# Patient Record
Sex: Male | Born: 1950
Health system: Southern US, Community
[De-identification: ages and names within clinical notes are randomized; demographics above are authoritative.]

## PROBLEM LIST (undated history)

## (undated) DIAGNOSIS — M6282 Rhabdomyolysis: Secondary | ICD-10-CM

## (undated) DIAGNOSIS — I10 Essential (primary) hypertension: Secondary | ICD-10-CM

## (undated) DIAGNOSIS — R600 Localized edema: Secondary | ICD-10-CM

## (undated) DIAGNOSIS — K224 Dyskinesia of esophagus: Secondary | ICD-10-CM

## (undated) DIAGNOSIS — K7689 Other specified diseases of liver: Secondary | ICD-10-CM

## (undated) DIAGNOSIS — I509 Heart failure, unspecified: Secondary | ICD-10-CM

## (undated) DIAGNOSIS — N183 Chronic kidney disease, stage 3 unspecified: Secondary | ICD-10-CM

## (undated) DIAGNOSIS — K649 Unspecified hemorrhoids: Secondary | ICD-10-CM

## (undated) DIAGNOSIS — R413 Other amnesia: Secondary | ICD-10-CM

## (undated) DIAGNOSIS — G9341 Metabolic encephalopathy: Secondary | ICD-10-CM

## (undated) DIAGNOSIS — N281 Cyst of kidney, acquired: Secondary | ICD-10-CM

## (undated) DIAGNOSIS — R7881 Bacteremia: Secondary | ICD-10-CM

## (undated) DIAGNOSIS — R066 Hiccough: Secondary | ICD-10-CM

## (undated) DIAGNOSIS — K219 Gastro-esophageal reflux disease without esophagitis: Secondary | ICD-10-CM

## (undated) DIAGNOSIS — F1011 Alcohol abuse, in remission: Secondary | ICD-10-CM

## (undated) DIAGNOSIS — N200 Calculus of kidney: Secondary | ICD-10-CM

## (undated) DIAGNOSIS — J9859 Other diseases of mediastinum, not elsewhere classified: Secondary | ICD-10-CM

## (undated) DIAGNOSIS — I5030 Unspecified diastolic (congestive) heart failure: Secondary | ICD-10-CM

## (undated) DIAGNOSIS — Z8719 Personal history of other diseases of the digestive system: Secondary | ICD-10-CM

## (undated) DIAGNOSIS — N400 Enlarged prostate without lower urinary tract symptoms: Secondary | ICD-10-CM

## (undated) DIAGNOSIS — K8020 Calculus of gallbladder without cholecystitis without obstruction: Secondary | ICD-10-CM

## (undated) DIAGNOSIS — K5730 Diverticulosis of large intestine without perforation or abscess without bleeding: Secondary | ICD-10-CM

## (undated) DIAGNOSIS — R0609 Other forms of dyspnea: Secondary | ICD-10-CM

## (undated) DIAGNOSIS — G629 Polyneuropathy, unspecified: Secondary | ICD-10-CM

## (undated) DIAGNOSIS — R222 Localized swelling, mass and lump, trunk: Secondary | ICD-10-CM

## (undated) DIAGNOSIS — R0600 Dyspnea, unspecified: Secondary | ICD-10-CM

## (undated) DIAGNOSIS — I700 Atherosclerosis of aorta: Secondary | ICD-10-CM

## (undated) HISTORY — DX: Essential (primary) hypertension: I10

---

## 1987-03-11 HISTORY — PX: MINOR HEMORRHOIDECTOMY: SHX6238

## 2004-02-08 ENCOUNTER — Ambulatory Visit: Payer: Self-pay | Admitting: Unknown Physician Specialty

## 2004-02-21 ENCOUNTER — Other Ambulatory Visit: Payer: Self-pay

## 2004-02-26 ENCOUNTER — Ambulatory Visit: Payer: Self-pay | Admitting: Unknown Physician Specialty

## 2009-07-09 ENCOUNTER — Emergency Department: Payer: Self-pay | Admitting: Emergency Medicine

## 2012-04-15 ENCOUNTER — Emergency Department: Payer: Self-pay | Admitting: Emergency Medicine

## 2012-04-15 ENCOUNTER — Ambulatory Visit: Payer: Self-pay | Admitting: Family Medicine

## 2012-04-15 LAB — COMPREHENSIVE METABOLIC PANEL
Albumin: 3.4 g/dL (ref 3.4–5.0)
Anion Gap: 7 (ref 7–16)
BUN: 17 mg/dL (ref 7–18)
Bilirubin,Total: 0.8 mg/dL (ref 0.2–1.0)
Calcium, Total: 8.5 mg/dL (ref 8.5–10.1)
Co2: 30 mmol/L (ref 21–32)
EGFR (African American): >60
EGFR (Non-African Amer.): >60
Glucose: 107 mg/dL — ABNORMAL HIGH (ref 65–99)
Potassium: 3.5 mmol/L (ref 3.5–5.1)
SGPT (ALT): 63 U/L (ref 12–78)
Sodium: 135 mmol/L — ABNORMAL LOW (ref 136–145)
Total Protein: 7.3 g/dL (ref 6.4–8.2)

## 2012-04-15 LAB — CBC
HCT: 47.7 % (ref 40.0–52.0)
HGB: 15.7 g/dL (ref 13.0–18.0)
MCH: 30.1 pg (ref 26.0–34.0)
MCHC: 32.8 g/dL (ref 32.0–36.0)
MCV: 92 fL (ref 80–100)
Platelet: 175 10*3/uL (ref 150–440)
RDW: 13.9 % (ref 11.5–14.5)
WBC: 3.7 10*3/uL — ABNORMAL LOW (ref 3.8–10.6)

## 2012-04-28 ENCOUNTER — Ambulatory Visit: Payer: Self-pay | Admitting: Family Medicine

## 2012-05-26 ENCOUNTER — Ambulatory Visit: Payer: Self-pay | Admitting: Family Medicine

## 2015-11-15 ENCOUNTER — Encounter: Payer: Self-pay | Admitting: Family Medicine

## 2015-11-15 ENCOUNTER — Emergency Department
Admission: EM | Admit: 2015-11-15 | Discharge: 2015-11-15 | Disposition: A | Discharge status: Home / Self Care | Payer: Medicare Other | Attending: Emergency Medicine | Admitting: Emergency Medicine

## 2015-11-15 ENCOUNTER — Ambulatory Visit (INDEPENDENT_AMBULATORY_CARE_PROVIDER_SITE_OTHER): Payer: Medicare Other | Admitting: Family Medicine

## 2015-11-15 ENCOUNTER — Emergency Department: Payer: Medicare Other

## 2015-11-15 ENCOUNTER — Encounter: Payer: Self-pay | Admitting: Emergency Medicine

## 2015-11-15 DIAGNOSIS — L02215 Cutaneous abscess of perineum: Secondary | ICD-10-CM | POA: Diagnosis not present

## 2015-11-15 DIAGNOSIS — Z87891 Personal history of nicotine dependence: Secondary | ICD-10-CM | POA: Insufficient documentation

## 2015-11-15 DIAGNOSIS — I10 Essential (primary) hypertension: Secondary | ICD-10-CM | POA: Insufficient documentation

## 2015-11-15 DIAGNOSIS — N433 Hydrocele, unspecified: Secondary | ICD-10-CM | POA: Diagnosis not present

## 2015-11-15 DIAGNOSIS — L03315 Cellulitis of perineum: Secondary | ICD-10-CM | POA: Insufficient documentation

## 2015-11-15 DIAGNOSIS — N50819 Testicular pain, unspecified: Secondary | ICD-10-CM

## 2015-11-15 DIAGNOSIS — E559 Vitamin D deficiency, unspecified: Secondary | ICD-10-CM | POA: Insufficient documentation

## 2015-11-15 LAB — URINALYSIS COMPLETE WITH MICROSCOPIC (ARMC ONLY)
BACTERIA UA: NONE SEEN
Bilirubin Urine: NEGATIVE
GLUCOSE, UA: 50 mg/dL — AB
LEUKOCYTES UA: NEGATIVE
Nitrite: NEGATIVE
PH: 6 (ref 5.0–8.0)
Protein, ur: 100 mg/dL — AB
SPECIFIC GRAVITY, URINE: 1.021 (ref 1.005–1.030)

## 2015-11-15 LAB — CBC WITH DIFFERENTIAL/PLATELET
BASOS PCT: 0 %
Basophils Absolute: 0.1 10*3/uL (ref 0–0.1)
EOS ABS: 0 10*3/uL (ref 0–0.7)
EOS PCT: 0 %
HCT: 46.9 % (ref 40.0–52.0)
Hemoglobin: 16 g/dL (ref 13.0–18.0)
LYMPHS ABS: 0.3 10*3/uL — AB (ref 1.0–3.6)
Lymphocytes Relative: 3 %
MCH: 33.8 pg (ref 26.0–34.0)
MCHC: 34.2 g/dL (ref 32.0–36.0)
MCV: 98.9 fL (ref 80.0–100.0)
Monocytes Absolute: 0.8 10*3/uL (ref 0.2–1.0)
Monocytes Relative: 7 %
Neutro Abs: 11 10*3/uL — ABNORMAL HIGH (ref 1.4–6.5)
Neutrophils Relative %: 90 %
PLATELETS: 214 10*3/uL (ref 150–440)
RBC: 4.74 MIL/uL (ref 4.40–5.90)
RDW: 13.9 % (ref 11.5–14.5)
WBC: 12.2 10*3/uL — AB (ref 3.8–10.6)

## 2015-11-15 LAB — COMPREHENSIVE METABOLIC PANEL
ALBUMIN: 3.3 g/dL — AB (ref 3.5–5.0)
ALT: 41 U/L (ref 17–63)
ANION GAP: 14 (ref 5–15)
AST: 46 U/L — ABNORMAL HIGH (ref 15–41)
Alkaline Phosphatase: 91 U/L (ref 38–126)
BUN: 20 mg/dL (ref 6–20)
CHLORIDE: 92 mmol/L — AB (ref 101–111)
CO2: 23 mmol/L (ref 22–32)
Calcium: 8.6 mg/dL — ABNORMAL LOW (ref 8.9–10.3)
Creatinine, Ser: 1.12 mg/dL (ref 0.61–1.24)
GFR calc non Af Amer: >60 mL/min (ref >60)
Glucose, Bld: 235 mg/dL — ABNORMAL HIGH (ref 65–99)
Potassium: 3.2 mmol/L — ABNORMAL LOW (ref 3.5–5.1)
SODIUM: 129 mmol/L — AB (ref 135–145)
Total Bilirubin: 1.5 mg/dL — ABNORMAL HIGH (ref 0.3–1.2)
Total Protein: 6.7 g/dL (ref 6.5–8.1)

## 2015-11-15 MED ORDER — HYDROMORPHONE HCL 1 MG/ML IJ SOLN: 0.5000 mg | Freq: Once | INTRAMUSCULAR | Status: DC

## 2015-11-15 MED ORDER — CLINDAMYCIN PHOSPHATE 600 MG/50ML IV SOLN
600.0000 mg | Freq: Once | INTRAVENOUS | Status: AC
  Administered 2015-11-15: 600 mg via INTRAVENOUS
  Filled 2015-11-15: qty 50

## 2015-11-15 MED ORDER — CLINDAMYCIN HCL 150 MG PO CAPS: 300.0000 mg | ORAL_CAPSULE | Freq: Four times a day (QID) | ORAL | 0 refills | Status: DC

## 2015-11-15 MED ORDER — HYDROMORPHONE HCL 1 MG/ML IJ SOLN
1.0000 mg | Freq: Once | INTRAMUSCULAR | Status: AC
  Administered 2015-11-15: 1 mg via INTRAVENOUS
  Filled 2015-11-15: qty 1

## 2015-11-15 MED ORDER — HYDROMORPHONE HCL 1 MG/ML IJ SOLN
0.5000 mg | Freq: Once | INTRAMUSCULAR | Status: AC
  Administered 2015-11-15: 0.5 mg via INTRAVENOUS

## 2015-11-15 MED ORDER — OXYCODONE-ACETAMINOPHEN 5-325 MG PO TABS: 1.0000 | ORAL_TABLET | ORAL | 0 refills | Status: DC | PRN

## 2015-11-15 MED ORDER — ONDANSETRON HCL 4 MG/2ML IJ SOLN
4.0000 mg | Freq: Once | INTRAMUSCULAR | Status: AC
  Administered 2015-11-15: 4 mg via INTRAVENOUS
  Filled 2015-11-15: qty 2

## 2015-11-15 MED ORDER — SODIUM CHLORIDE 0.9 % IV SOLN
Freq: Once | INTRAVENOUS | Status: AC
  Administered 2015-11-15: 14:00:00 via INTRAVENOUS

## 2015-11-15 MED ORDER — HYDROCODONE-ACETAMINOPHEN 5-325 MG PO TABS: 1.0000 | ORAL_TABLET | ORAL | 0 refills | Status: DC | PRN

## 2015-11-15 MED ORDER — HYDROMORPHONE HCL 1 MG/ML IJ SOLN
INTRAMUSCULAR | Status: AC
  Administered 2015-11-15: 0.5 mg via INTRAVENOUS
  Filled 2015-11-15: qty 1

## 2015-11-15 NOTE — ED Triage Notes (Signed)
Pt arrives ambulatory to triage with reports of a perineal abscess that has been bothering him since Saturday  Pt reports left sided groin / inner thigh pain and edema

## 2015-11-15 NOTE — Assessment & Plan Note (Signed)
Elevated today with acute pain Not on anti-HTN treatment currently Follow-up once acute painful infection resolved, re-address HTN

## 2015-11-15 NOTE — Assessment & Plan Note (Signed)
Worsening L>R perineal infection with cellulitis and suspected abscess extending from beyond rectum across perineum into L>R scrotum. Significant erythema, induration, tenderness, no evidence of active draining. Clinically still well appearing except for pain, but some hemodynamic instability with elevated BP and tachycardic, low grade temp 99.53F. Concern for progression of infection.  Plan: 1. Discussed case with college Dr Luan Pulling, contacted [facility]rology and spoke with Dr Erlene Quan to review case briefly as considered possible outpatient Urology eval, mutual decision to send patient directly to [facility] ED for further expedited work-up and management, as he may require evaluation by General Surgery. Suspect will need labs, CT imaging, possible I&D and IV antibiotics 2. Follow-up to re-establish care once this acute problem is treated

## 2015-11-15 NOTE — ED Notes (Signed)
Pt transported to US at this time. 

## 2015-11-15 NOTE — Progress Notes (Signed)
Subjective:    Patient ID: John Booker, male    DOB: Mar 30, 1950, 65 y.o.   MRN: [id number]  John Booker is a 65 y.o. male presenting on 11/15/2015 for Cellulitis (Perineal)   HPI   PERINEAL ABSCESS / CELLULITIS: 1 week ago started rectal pain, constant, gradually worsening, until 2 days ago severe pain, initially 1/10, now up to 12/10, improved with relieving pressure, standing or laying on sides but significant worsening with sitting. Has tried applying some ice without improvement. Not tried any medications. Never had similar episode before. Thought to have "slightly enlarged prostate" in the past, but no significant symptoms. Denies any groin/pelvic trauma, riding bike / motorcycle - Admits occasional temperature instability but no recorded fever, severe diarrhea x 2 days (>7x daily) - Denies any sweats, dysuria, hematuria, decreased urine output, abdominal pain, nausea, vomiting, back or flank pain  Additoinal soc history: Currently works part-time as maintenance, not able to work at this time due to pain  Family History  Problem Relation Age of Onset  . Hypertension Father     Past Surgical History:  Procedure Laterality Date  . MINOR HEMORRHOIDECTOMY  1989    Social History   Social History  . Marital status: Legally Separated    Spouse name: N/A  . Number of children: N/A  . Years of education: N/A   Social History Main Topics  . Smoking status: Former Smoker    Packs/day: 0.50    Years: 38.00    Types: Cigarettes  . Smokeless tobacco: Never Used  . Alcohol use No  . Drug use: No  . Sexual activity: Not Asked   Other Topics Concern  . None   Social History Narrative  . None    Review of Systems  Constitutional: Positive for activity change (limited sitting and ambulation due to perineal pain) and chills. Negative for appetite change, diaphoresis, fatigue, fever and unexpected weight change.  HENT: Negative for hearing loss, rhinorrhea and sinus  pressure.   Eyes: Negative for visual disturbance.  Respiratory: Negative for cough, chest tightness and shortness of breath.   Cardiovascular: Negative for chest pain, palpitations and leg swelling.  Gastrointestinal: Positive for diarrhea (improved) and rectal pain. Negative for abdominal distention, abdominal pain, anal bleeding, blood in stool, constipation, nausea and vomiting.  Endocrine: Negative for cold intolerance and heat intolerance.  Genitourinary: Positive for scrotal swelling and testicular pain. Negative for decreased urine volume, difficulty urinating, discharge, dysuria, flank pain, frequency, hematuria, penile pain, penile swelling and urgency.  Musculoskeletal: Negative for arthralgias.  Skin: Positive for rash (erythema groin). Negative for wound.  Neurological: Negative for dizziness, weakness, light-headedness, numbness and headaches.  Hematological: Positive for adenopathy (inguinal).  Psychiatric/Behavioral: Negative for confusion and dysphoric mood. The patient is not nervous/anxious.    Per HPI unless specifically indicated above     Objective:    BP (!) 180/112 (BP Location: Left Arm, Patient Position: Sitting, Cuff Size: Large)   Pulse (!) 109   Temp 99.7 F (37.6 C) (Oral)   Resp 16   Ht 6' (1.829 m)   Wt 215 lb (97.5 kg)   BMI 29.16 kg/m   Wt Readings from Last 3 Encounters:  11/15/15 215 lb (97.5 kg)    Physical Exam  Constitutional: He is oriented to person, place, and time. He appears well-developed and well-nourished. No distress.  Currently well appearing but significant discomfort due to groin pain, unable to sit, prefer to stand and able to lay on exam  table  HENT:  Head: Normocephalic and atraumatic.  Eyes: Conjunctivae and EOM are normal. Pupils are equal, round, and reactive to light. Right eye exhibits no discharge. Left eye exhibits no discharge.  Neck: Neck supple.  Cardiovascular: Regular rhythm and normal heart sounds.   No murmur  heard. tachycardic  Pulmonary/Chest: Effort normal and breath sounds normal. No respiratory distress.  Abdominal: Soft. He exhibits no distension and no mass. There is no tenderness.  Genitourinary:  Genitourinary Comments: Significant perineal erythema, warmth, induration with edema, primarily left sided, extending from rectum into scrotum, L>R. Erythema is bilateral perineal area extending to and beyond rectum. No evidence of ulceration or wound opening, no drainage of pus or bleeding. No obvious external hemorrhoid  Musculoskeletal: He exhibits no edema.  Lymphadenopathy:       Right: Inguinal adenopathy present.       Left: Inguinal adenopathy present.  Neurological: He is alert and oriented to person, place, and time.  Skin: Skin is warm and dry. He is not diaphoretic. There is erythema (perineal, see above).  Nursing note and vitals reviewed.      Assessment & Plan:   Problem List Items Addressed This Visit    Perineal abscess    Worsening L>R perineal infection with cellulitis and suspected abscess extending from beyond rectum across perineum into L>R scrotum. Significant erythema, induration, tenderness, no evidence of active draining. Clinically still well appearing except for pain, but some hemodynamic instability with elevated BP and tachycardic, low grade temp 99.83F. Concern for progression of infection.  Plan: 1. Discussed case with college Dr Luan Pulling, contacted [facility] Urology and spoke with Dr Erlene Quan to review case briefly as considered possible outpatient Urology eval, mutual decision to send patient directly to [facility] ED for further expedited work-up and management, as he may require evaluation by General Surgery. Suspect will need labs, CT imaging, possible I&D and IV antibiotics 2. Follow-up to re-establish care once this acute problem is treated      HTN (hypertension)    Elevated today with acute pain Not on anti-HTN treatment currently Follow-up once acute  painful infection resolved, re-address HTN       Other Visit Diagnoses   None.     No orders of the defined types were placed in this encounter.     Follow up plan: Return in about 3 weeks (around 12/06/2015) for re-establish care, after resolved perineal abscess.   A total of 45 minutes was spent face-to-face with this patient. Greater than 50% of this time was spent in counseling and coordination of care with the patient, contacting Urology specialist and ED to discuss case with worsening perineal abscess, patient was ultimately urgently sent directly to ED for further work-up.   [name], [facility] 11/15/2015, 9:57 AM

## 2015-11-15 NOTE — Patient Instructions (Signed)
Thank you for coming in to clinic today.  1. Please go directly to the [facility] ED - for Perineal Abscess infection - May need bloodwork, imaging, possibly incision and drainage, likely IV antibiotics - I spoke with Dr Erlene Quan [facility] urology) they said maybe General Surgery might need to be involved as well, this will be determined in the ED  Once you are discharged from ED or hospital and feeling better, please re-schedule a new patient visit with [place] to re-establish care, and bring the new patient paperwork.  If you have any other questions or concerns, please feel free to call the clinic or send a message through [facility]. You may also schedule an earlier appointment if necessary.  [name], DO [place]

## 2015-11-15 NOTE — ED Provider Notes (Signed)
[facility] Emergency Department Provider Note   ____________________________________________   None    (approximate)  I have reviewed the triage vital signs and the nursing notes.   HISTORY  Chief Complaint Abscess   HPI John Booker is a 65 y.o. male who presents with severe 10/10 pain of the perineum.  Pain began 4 days ago and has intensified.  Reports that lying down reduces the pain, but sitting, standing, and walking aggravate the pain. Reports anorexia, nausea, fatigue, "hot flashes" and chills.  States he has tried to maintain fluid intake with water, Gatorade, and Sprite.  Reports diarrhea for past 2 days without pain on defecation.  Denies vomiting, or bleeding from perineum or rectum.  States he has not tried any medications to control symptoms.  Pain has prevented patient from working. He went to an urgent care this morning, who referred him to the ED.  The provider at the urgent care diagnosed him with a perineal abscess and told the patient he would need to see either a urologist or general surgeon.     History reviewed. No pertinent past medical history.  Patient Active Problem List   Diagnosis Date Noted  . Perineal abscess 11/15/2015  . HTN (hypertension) 11/15/2015  . Vitamin D deficiency 11/15/2015    Past Surgical History:  Procedure Laterality Date  . [place]    Prior to Admission medications   Medication Sig Start Date End Date Taking? Authorizing Provider  clindamycin (CLEOCIN) 150 MG capsule Take 2 capsules (300 mg total) by mouth 4 (four) times daily. 11/15/15   Johnn Hai, PA-C  oxyCODONE-acetaminophen (PERCOCET) 5-325 MG tablet Take 1-2 tablets by mouth every 4 (four) hours as needed for severe pain. 11/15/15   Johnn Hai, PA-C    Allergies Review of patient's allergies indicates no known allergies.  Family History  Problem Relation Age of Onset  . Hypertension Father     Social  History Social History  Substance Use Topics  . Smoking status: Former Smoker    Packs/day: 0.50    Years: 38.00    Types: Cigarettes  . Smokeless tobacco: Never Used  . Alcohol use No    Review of Systems Constitutional: Endorses fever/chills, fatigue. Cardiovascular: Denies chest pain. Respiratory: Denies shortness of breath. Gastrointestinal: No abdominal pain.  Endorses anorexia, nausea, and diarrhea. No vomiting.  No constipation. Denies rectal bleeding or pain on defecation. Genitourinary: Endorses decreased urination.  Negative for dysuria.  Endorses painful mass between scrotum and anus.  Skin: No rash. 10-point ROS otherwise negative.  ____________________________________________   PHYSICAL EXAM:  VITAL SIGNS: ED Triage Vitals  Enc Vitals Group     BP 11/15/15 1125 (!) 164/79     Pulse Rate 11/15/15 1125 98     Resp 11/15/15 1125 18     Temp 11/15/15 1125 98 F (36.7 C)     Temp src --      SpO2 11/15/15 1125 98 %     Weight 11/15/15 1126 215 lb (97.5 kg)     Height 11/15/15 1126 6' (1.829 m)     Head Circumference --      Peak Flow --      Pain Score 11/15/15 1127 10     Pain Loc --      Pain Edu? --      Excl. in [place]? --     Constitutional: Alert and oriented. Appears in pain, but in no acute distress. Cardiovascular:  Normal rate, regular rhythm. Grossly normal heart sounds.  Good peripheral circulation. Respiratory: Normal respiratory effort.  No retractions. Lungs CTAB. Gastrointestinal: Soft and nontender. No distention.  Genitourinary: Right scrotum slightly enlarged but minimal tenderness is present. Peritoneal area with erythema, warm, nonfluctuant and extremely tender to palpation. No drainage is present. Neurologic:  Normal speech and language. No gross focal neurologic deficits are appreciated. No gait instability. Skin:  Skin is warm, dry and intact. No rash noted. Psychiatric: Mood and affect are normal. Speech and behavior are  normal.  ____________________________________________   LABS (all labs ordered are listed, but only abnormal results are displayed)  Labs Reviewed  URINALYSIS COMPLETEWITH MICROSCOPIC ([facility] ONLY) - Abnormal; Notable for the following:       Result Value   Color, Urine AMBER (*)    APPearance CLEAR (*)    Glucose, UA 50 (*)    Ketones, ur 1+ (*)    Hgb urine dipstick 1+ (*)    Protein, ur 100 (*)    Squamous Epithelial / LPF 0-5 (*)    All other components within normal limits  CBC WITH DIFFERENTIAL/PLATELET - Abnormal; Notable for the following:    WBC 12.2 (*)    Neutro Abs 11.0 (*)    Lymphs Abs 0.3 (*)    All other components within normal limits  COMPREHENSIVE METABOLIC PANEL - Abnormal; Notable for the following:    Sodium 129 (*)    Potassium 3.2 (*)    Chloride 92 (*)    Glucose, Bld 235 (*)    Calcium 8.6 (*)    Albumin 3.3 (*)    AST 46 (*)    Total Bilirubin 1.5 (*)    All other components within normal limits  CULTURE, BLOOD (ROUTINE X 2)  CULTURE, BLOOD (ROUTINE X 2)     RADIOLOGY Ultrasound of the scrotum and lower pelvis per radiologist: IMPRESSION:  1. No evidence for testicular mass or torsion.  2. Bilateral hydroceles  3. Right varicocele  4. Skin thickening involving the scrotum is identified which may  reflect cellulitis.     ____________________________________________   PROCEDURES  Procedure(s) performed: None  Procedures  Critical Care performed: No  ____________________________________________   INITIAL IMPRESSION / ASSESSMENT AND PLAN / ED COURSE  Pertinent labs & imaging results that were available during my care of the patient were reviewed by me and considered in my medical decision making (see chart for details).    Clinical Course   Patient received clindamycin while in the emergency room while waiting on results of the ultrasound. Patient also had Dilaudid for pain control and stated prior to discharge that pain  was improved. We discussed the findings of his ultrasound and that there is no localized pocket of infection and what cellulitis means. Patient is continue taking clindamycin. He was given a prescription for Percocet one or 2 every 4 hours as needed for severe pain. He is to use sitz baths or warm compresses to the area. He is to follow-up with his primary care doctor for any continued problems. He is return to the emergency room if any severe worsening of his symptoms, fever over 101 or greater, or nausea and vomiting.  ____________________________________________   FINAL CLINICAL IMPRESSION(S) / ED DIAGNOSES  Final diagnoses:  Cellulitis of perineum      NEW MEDICATIONS STARTED DURING THIS VISIT:  New Prescriptions   CLINDAMYCIN (CLEOCIN) 150 MG CAPSULE    Take 2 capsules (300 mg total) by mouth 4 (four) times  daily.   OXYCODONE-ACETAMINOPHEN (PERCOCET) 5-325 MG TABLET    Take 1-2 tablets by mouth every 4 (four) hours as needed for severe pain.     Note:  This document was prepared using Dragon voice recognition software and may include unintentional dictation errors.    Johnn Hai, PA-C 11/16/15 Herrings Summers, PA-C 11/16/15 1619    Orbie Pyo, MD 11/16/15 [phone]

## 2015-11-15 NOTE — Discharge Instructions (Addendum)
Warm moist compresses to the area frequently or sitz bath Begin clindamycin tonight. Norco one or 2 capsules every 4-6 hours if needed for pain. Be  aware that this medication could cause drowsiness and increase your risk for falling. Return to the emergency room for any fever over 101, nausea vomiting, or severe worsening of your symptoms.

## 2015-11-15 NOTE — ED Notes (Signed)
Pt was sent over from [facility] for further eval of perianal abcsess. Pt states loss of appetite and chills since Saturday.

## 2015-11-16 ENCOUNTER — Telehealth: Payer: Self-pay | Admitting: General Surgery

## 2015-11-16 NOTE — Telephone Encounter (Signed)
Patient was seen in the ER on 11/15/15. He was referred to the general surgeon for cellulitis of perineum. Dr Adonis Huguenin states he would like to see the patient in the office this afternoon (9/8). I called patient to make an appointment. Patient was upset because he says they should have made the appointment for him yesterday. He said he cannot come into the office today and that he never saw Dr Adonis Huguenin in the ER. I explained to him that Dr Adonis Huguenin was the general surgeon on call and the ER doctor referred the patient to see him. Patient states he is not coming to the office today. I attempted to make an appointment for him with Dr Dahlia Byes on Monday 9/11. Patient again said he is not coming to see the surgeon. Patient said he was told that if he is not better in 24-48 hours to return to the ER. He states he will return to the ER tomorrow, Saturday, if his symptoms have not improved. I advised him to call me Monday morning if he changes his mind and/or if he returns to the ER over the weekend and is again referred to the surgeon.

## 2015-11-19 ENCOUNTER — Encounter
Admission: EM | Disposition: A | Discharge status: Skilled Nursing Facility | Payer: Self-pay | Source: Home / Self Care | Attending: Surgery

## 2015-11-19 ENCOUNTER — Emergency Department: Payer: Medicare Other | Admitting: Anesthesiology

## 2015-11-19 ENCOUNTER — Encounter: Payer: Self-pay | Admitting: Emergency Medicine

## 2015-11-19 ENCOUNTER — Emergency Department: Payer: Medicare Other

## 2015-11-19 ENCOUNTER — Inpatient Hospital Stay
Admission: EM | Admit: 2015-11-19 | Discharge: 2015-11-23 | DRG: 330 | Disposition: A | Discharge status: Skilled Nursing Facility | Payer: Medicare Other | Attending: Surgery | Admitting: Surgery

## 2015-11-19 DIAGNOSIS — B9620 Unspecified Escherichia coli [E. coli] as the cause of diseases classified elsewhere: Secondary | ICD-10-CM | POA: Diagnosis present

## 2015-11-19 DIAGNOSIS — K613 Ischiorectal abscess: Secondary | ICD-10-CM | POA: Diagnosis not present

## 2015-11-19 DIAGNOSIS — K611 Rectal abscess: Secondary | ICD-10-CM | POA: Diagnosis present

## 2015-11-19 DIAGNOSIS — Z87891 Personal history of nicotine dependence: Secondary | ICD-10-CM | POA: Diagnosis not present

## 2015-11-19 DIAGNOSIS — K610 Anal abscess: Secondary | ICD-10-CM | POA: Diagnosis not present

## 2015-11-19 DIAGNOSIS — K604 Rectal fistula: Secondary | ICD-10-CM | POA: Diagnosis not present

## 2015-11-19 DIAGNOSIS — Z8249 Family history of ischemic heart disease and other diseases of the circulatory system: Secondary | ICD-10-CM | POA: Diagnosis not present

## 2015-11-19 DIAGNOSIS — L02215 Cutaneous abscess of perineum: Secondary | ICD-10-CM | POA: Diagnosis not present

## 2015-11-19 DIAGNOSIS — Z48817 Encounter for surgical aftercare following surgery on the skin and subcutaneous tissue: Secondary | ICD-10-CM | POA: Diagnosis not present

## 2015-11-19 DIAGNOSIS — K603 Anal fistula: Secondary | ICD-10-CM | POA: Diagnosis not present

## 2015-11-19 DIAGNOSIS — N492 Inflammatory disorders of scrotum: Secondary | ICD-10-CM | POA: Diagnosis present

## 2015-11-19 DIAGNOSIS — K605 Anorectal fistula: Secondary | ICD-10-CM | POA: Diagnosis not present

## 2015-11-19 DIAGNOSIS — Z4803 Encounter for change or removal of drains: Secondary | ICD-10-CM | POA: Diagnosis not present

## 2015-11-19 DIAGNOSIS — R9389 Abnormal findings on diagnostic imaging of other specified body structures: Secondary | ICD-10-CM

## 2015-11-19 HISTORY — PX: INCISION AND DRAINAGE PERIRECTAL ABSCESS: SHX1804

## 2015-11-19 LAB — BASIC METABOLIC PANEL
ANION GAP: 11 (ref 5–15)
BUN: 14 mg/dL (ref 6–20)
CO2: 28 mmol/L (ref 22–32)
Calcium: 8.9 mg/dL (ref 8.9–10.3)
Chloride: 93 mmol/L — ABNORMAL LOW (ref 101–111)
Creatinine, Ser: 0.92 mg/dL (ref 0.61–1.24)
GFR calc Af Amer: >60 mL/min (ref >60)
GFR calc non Af Amer: >60 mL/min (ref >60)
GLUCOSE: 149 mg/dL — AB (ref 65–99)
POTASSIUM: 3.3 mmol/L — AB (ref 3.5–5.1)
Sodium: 132 mmol/L — ABNORMAL LOW (ref 135–145)

## 2015-11-19 LAB — CBC
HEMATOCRIT: 44 % (ref 40.0–52.0)
Hemoglobin: 15.4 g/dL (ref 13.0–18.0)
MCH: 34.3 pg — AB (ref 26.0–34.0)
MCHC: 35.1 g/dL (ref 32.0–36.0)
MCV: 97.7 fL (ref 80.0–100.0)
PLATELETS: 293 10*3/uL (ref 150–440)
RBC: 4.5 MIL/uL (ref 4.40–5.90)
RDW: 14.2 % (ref 11.5–14.5)
WBC: 8.7 10*3/uL (ref 3.8–10.6)

## 2015-11-19 SURGERY — INCISION AND DRAINAGE, ABSCESS, PERIRECTAL
Anesthesia: General

## 2015-11-19 MED ORDER — PROPOFOL 10 MG/ML IV BOLUS
INTRAVENOUS | Status: DC | PRN
  Administered 2015-11-19: 150 mg via INTRAVENOUS

## 2015-11-19 MED ORDER — SODIUM CHLORIDE 0.9 % IR SOLN
Status: DC | PRN
  Administered 2015-11-19: 150 mL

## 2015-11-19 MED ORDER — MIDAZOLAM HCL 2 MG/2ML IJ SOLN
INTRAMUSCULAR | Status: DC | PRN
  Administered 2015-11-19: 2 mg via INTRAVENOUS

## 2015-11-19 MED ORDER — MEPERIDINE HCL 25 MG/ML IJ SOLN: 6.2500 mg | INTRAMUSCULAR | Status: DC | PRN

## 2015-11-19 MED ORDER — HEPARIN SODIUM (PORCINE) 5000 UNIT/ML IJ SOLN
5000.0000 [IU] | Freq: Three times a day (TID) | INTRAMUSCULAR | Status: DC
  Administered 2015-11-19 – 2015-11-23 (×10): 5000 [IU] via SUBCUTANEOUS
  Filled 2015-11-19 (×10): qty 1

## 2015-11-19 MED ORDER — LACTATED RINGERS IV SOLN
INTRAVENOUS | Status: DC | PRN
Start: 2015-11-19 — End: 2015-11-19
  Administered 2015-11-19: 16:00:00 via INTRAVENOUS

## 2015-11-19 MED ORDER — VANCOMYCIN HCL IN DEXTROSE 1-5 GM/200ML-% IV SOLN
1000.0000 mg | Freq: Two times a day (BID) | INTRAVENOUS | Status: DC
  Administered 2015-11-19 – 2015-11-20 (×2): 1000 mg via INTRAVENOUS
  Filled 2015-11-19 (×4): qty 200

## 2015-11-19 MED ORDER — PROMETHAZINE HCL 25 MG/ML IJ SOLN: 6.2500 mg | INTRAMUSCULAR | Status: DC | PRN

## 2015-11-19 MED ORDER — IOPAMIDOL (ISOVUE-300) INJECTION 61%
15.0000 mL | INTRAVENOUS | Status: AC
Start: 2015-11-19 — End: 2015-11-19
  Administered 2015-11-19: 15 mL via ORAL

## 2015-11-19 MED ORDER — OXYCODONE HCL 5 MG PO TABS: 5.0000 mg | ORAL_TABLET | Freq: Once | ORAL | Status: DC | PRN

## 2015-11-19 MED ORDER — SILVER SULFADIAZINE 1 % EX CREA
TOPICAL_CREAM | CUTANEOUS | Status: DC | PRN
  Administered 2015-11-19: 1 via TOPICAL

## 2015-11-19 MED ORDER — SILVER SULFADIAZINE 1 % EX CREA
TOPICAL_CREAM | CUTANEOUS | Status: AC
  Filled 2015-11-19: qty 85

## 2015-11-19 MED ORDER — IOPAMIDOL (ISOVUE-370) INJECTION 76%
100.0000 mL | Freq: Once | INTRAVENOUS | Status: AC | PRN
  Administered 2015-11-19: 100 mL via INTRAVENOUS

## 2015-11-19 MED ORDER — SODIUM CHLORIDE 0.9 % IV BOLUS (SEPSIS)
1000.0000 mL | Freq: Once | INTRAVENOUS | Status: AC
  Administered 2015-11-19: 1000 mL via INTRAVENOUS

## 2015-11-19 MED ORDER — FENTANYL CITRATE (PF) 100 MCG/2ML IJ SOLN
INTRAMUSCULAR | Status: AC
  Administered 2015-11-19: 25 ug via INTRAVENOUS
  Filled 2015-11-19: qty 2

## 2015-11-19 MED ORDER — CLINDAMYCIN PHOSPHATE 900 MG/50ML IV SOLN
900.0000 mg | Freq: Once | INTRAVENOUS | Status: AC
  Administered 2015-11-19: 900 mg via INTRAVENOUS
  Filled 2015-11-19: qty 50

## 2015-11-19 MED ORDER — DEXTROSE IN LACTATED RINGERS 5 % IV SOLN
INTRAVENOUS | Status: DC
  Administered 2015-11-19 – 2015-11-20 (×2): via INTRAVENOUS

## 2015-11-19 MED ORDER — PIPERACILLIN-TAZOBACTAM 3.375 G IVPB 30 MIN
3.3750 g | Freq: Once | INTRAVENOUS | Status: AC
  Administered 2015-11-19: 3.375 g via INTRAVENOUS
  Filled 2015-11-19: qty 50

## 2015-11-19 MED ORDER — PIPERACILLIN-TAZOBACTAM 3.375 G IVPB 30 MIN: 3.3750 g | Freq: Three times a day (TID) | INTRAVENOUS | Status: DC

## 2015-11-19 MED ORDER — ONDANSETRON HCL 4 MG/2ML IJ SOLN
4.0000 mg | Freq: Once | INTRAMUSCULAR | Status: AC
  Administered 2015-11-19: 4 mg via INTRAVENOUS
  Filled 2015-11-19: qty 2

## 2015-11-19 MED ORDER — PIPERACILLIN-TAZOBACTAM 3.375 G IVPB
3.3750 g | Freq: Three times a day (TID) | INTRAVENOUS | Status: DC
  Administered 2015-11-19 – 2015-11-23 (×10): 3.375 g via INTRAVENOUS
  Filled 2015-11-19 (×8): qty 50

## 2015-11-19 MED ORDER — VANCOMYCIN HCL IN DEXTROSE 1-5 GM/200ML-% IV SOLN
1000.0000 mg | Freq: Once | INTRAVENOUS | Status: AC
  Administered 2015-11-19: 1000 mg via INTRAVENOUS
  Filled 2015-11-19: qty 200

## 2015-11-19 MED ORDER — HYDROMORPHONE HCL 1 MG/ML IJ SOLN
1.0000 mg | INTRAMUSCULAR | Status: DC | PRN
  Administered 2015-11-19 – 2015-11-21 (×5): 1 mg via INTRAVENOUS
  Filled 2015-11-19 (×6): qty 1

## 2015-11-19 MED ORDER — HEPARIN SODIUM (PORCINE) 5000 UNIT/ML IJ SOLN: 5000.0000 [IU] | Freq: Three times a day (TID) | INTRAMUSCULAR | Status: DC

## 2015-11-19 MED ORDER — OXYCODONE HCL 5 MG/5ML PO SOLN: 5.0000 mg | Freq: Once | ORAL | Status: DC | PRN

## 2015-11-19 MED ORDER — DEXAMETHASONE SODIUM PHOSPHATE 10 MG/ML IJ SOLN
INTRAMUSCULAR | Status: DC | PRN
  Administered 2015-11-19: 10 mg via INTRAVENOUS

## 2015-11-19 MED ORDER — ONDANSETRON HCL 4 MG/2ML IJ SOLN
INTRAMUSCULAR | Status: DC | PRN
  Administered 2015-11-19: 4 mg via INTRAVENOUS

## 2015-11-19 MED ORDER — FENTANYL CITRATE (PF) 100 MCG/2ML IJ SOLN
25.0000 ug | INTRAMUSCULAR | Status: AC | PRN
  Administered 2015-11-19 (×6): 25 ug via INTRAVENOUS

## 2015-11-19 MED ORDER — FENTANYL CITRATE (PF) 100 MCG/2ML IJ SOLN
INTRAMUSCULAR | Status: DC | PRN
  Administered 2015-11-19 (×3): 50 ug via INTRAVENOUS

## 2015-11-19 SURGICAL SUPPLY — 28 items
BLADE SURG 15 STRL LF DISP TIS (BLADE) ×1 IMPLANT
BLADE SURG 15 STRL SS (BLADE) ×3
BLADE SURG SZ11 CARB STEEL (BLADE) ×3 IMPLANT
BNDG GAUZE 4.5X4.1 6PLY STRL (MISCELLANEOUS) ×3 IMPLANT
BRIEF STRETCH MATERNITY 2XLG (MISCELLANEOUS) ×3 IMPLANT
CANISTER SUCT 1200ML W/VALVE (MISCELLANEOUS) ×3 IMPLANT
DRAIN PENROSE 1/4X12 LTX (DRAIN) IMPLANT
DRAIN PENROSE 5/8X18 LTX STRL (WOUND CARE) ×3 IMPLANT
DRAPE LAPAROTOMY 100X77 ABD (DRAPES) ×3 IMPLANT
DRAPE LEGGINS SURG 28X43 STRL (DRAPES) IMPLANT
DRAPE UNDER BUTTOCK W/FLU (DRAPES) ×3 IMPLANT
GAUZE IODOFORM PACK 1/2 7832 (GAUZE/BANDAGES/DRESSINGS) ×3 IMPLANT
GAUZE SPONGE 4X4 12PLY STRL (GAUZE/BANDAGES/DRESSINGS) IMPLANT
GLOVE BIO SURGEON STRL SZ8 (GLOVE) ×6 IMPLANT
GOWN STRL REUS W/ TWL LRG LVL3 (GOWN DISPOSABLE) ×2 IMPLANT
GOWN STRL REUS W/TWL LRG LVL3 (GOWN DISPOSABLE) ×6
KIT RM TURNOVER STRD PROC AR (KITS) ×3 IMPLANT
LABEL OR SOLS (LABEL) ×3 IMPLANT
NS IRRIG 500ML POUR BTL (IV SOLUTION) ×3 IMPLANT
PACK BASIN MINOR ARMC (MISCELLANEOUS) ×3 IMPLANT
PAD ABD DERMACEA PRESS 5X9 (GAUZE/BANDAGES/DRESSINGS) ×9 IMPLANT
PREP PVP WINGED SPONGE (MISCELLANEOUS) ×3 IMPLANT
SPONGE LAP 18X18 5 PK (GAUZE/BANDAGES/DRESSINGS) ×6 IMPLANT
SUT ETH BLK MONO 3 0 FS 1 12/B (SUTURE) ×3 IMPLANT
SUT NYLON 2-0 (SUTURE) ×3 IMPLANT
SWAB CULTURE AMIES ANAERIB BLU (MISCELLANEOUS) ×3 IMPLANT
SYR BULB IRRIG 60ML STRL (SYRINGE) ×3 IMPLANT
TRAY PREP VAG/GEN (MISCELLANEOUS) ×3 IMPLANT

## 2015-11-19 NOTE — Anesthesia Postprocedure Evaluation (Signed)
Anesthesia Post Note  Patient: John Booker  Procedure(s) Performed: Procedure(s) (LRB): IRRIGATION AND DEBRIDEMENT PERIRECTAL ABSCESS and debridement of scrotal abscess (N/A)  Patient location during evaluation: PACU Anesthesia Type: General Level of consciousness: awake and alert and oriented Pain management: pain level controlled Vital Signs Assessment: post-procedure vital signs reviewed and stable Respiratory status: spontaneous breathing, nonlabored ventilation and respiratory function stable Cardiovascular status: blood pressure returned to baseline and stable Postop Assessment: no signs of nausea or vomiting Anesthetic complications: no    Last Vitals:  Vitals:   11/19/15 1724 11/19/15 1739  BP: (!) 178/104 (!) 173/87  Pulse: 86 77  Resp: 20 (!) 26  Temp:      Last Pain:  Vitals:   11/19/15 1739  TempSrc:   PainSc: 4                  Elijio Staples

## 2015-11-19 NOTE — ED Notes (Signed)
Report given to OR RN. Consent obtained.

## 2015-11-19 NOTE — ED Triage Notes (Signed)
Pt to ed with c/o swelling and pain to groin area.  Pt states he was seen here last week for same started on abx and then was to see surgeon. Pt states he was unable to see surgeon and now perineum is worse, with increased swelling, pain.

## 2015-11-19 NOTE — Op Note (Signed)
11/19/2015  5:07 PM  PATIENT:  John Booker  65 y.o. male  PRE-OPERATIVE DIAGNOSIS:  Perineal abscess  POST-OPERATIVE DIAGNOSIS:  Perineal abscess with extension into the perirectal area with necrosis  PROCEDURE: Examination under anesthesia incision and drainage of a perirectal abscess and debridement of a perineal abscess  SURGEON:  Florene Glen MD, FACS   ANESTHESIA:  Gen. with endotracheal tube   Details of Procedure: This a patient with a perineal abscess that is been on antibiotics and failed outpatient therapy. Preoperative discussed rationale for surgery the options of observation risk bleeding infection recurrence and open wound drain placement etc. this is all reviewed for him in the preop holding area he understood and agreed to proceed.  Patient was induced general anesthesia placed in high lithotomy position and a time out was held.  It was inspected then prepped and draped in the sterile fashion. On the left side was multiple draining sites that were probed and cultured a large amount of purulent material came from the probing and a obvious cavity extended both cephalad or anterior towards the scrotum and posterior towards the rectum. An incision was made to connect the 2 areas of drainage which identified a large amount of necrotic tissue which was debrided both sharply and with the use of a laparotomy pad. His debrided back to good healthy tissue. Another large cavity extended posteriorly which was probed and then a counter incision was made and a Penrose drain was placed and held in with 3-0 nylon. Clean packing was placed in the anterior open wound and then a sterile dressing was placed  A Foley catheter was placed at the end of the case.  Patient out of the procedure well the workup occasions he was taken to recovery room in stable condition to be admitted for continued care.  He will require additional dressing changes in the operating room and will be kept  nothing by mouth.   Florene Glen, MD FACS

## 2015-11-19 NOTE — ED Notes (Signed)
Patient has had two large loose BMs. Patient is incontinent, states he can't control it. Dr. Quentin Cornwall aware.

## 2015-11-19 NOTE — ED Notes (Signed)
Patient voided. Foley held at this time per Dr. Quentin Cornwall. Patient has left for imaging.

## 2015-11-19 NOTE — Transfer of Care (Signed)
Immediate Anesthesia Transfer of Care Note  Patient: John Booker  Procedure(s) Performed: Procedure(s): IRRIGATION AND DEBRIDEMENT PERIRECTAL ABSCESS and debridement of scrotal abscess (N/A)  Patient Location: PACU  Anesthesia Type:General  Level of Consciousness: awake, alert  and oriented  Airway & Oxygen Therapy: Patient Spontanous Breathing and Patient connected to face mask oxygen  Post-op Assessment: Report given to RN and Post -op Vital signs reviewed and stable  Post vital signs: Reviewed and stable  Last Vitals:  Vitals:   11/19/15 1451 11/19/15 1709  BP: (!) 170/90 (!) 168/94  Pulse: 72 90  Resp: 18 18  Temp:  37.3 C    Last Pain:  Vitals:   11/19/15 1451  TempSrc:   PainSc: 8          Complications: No apparent anesthesia complications

## 2015-11-19 NOTE — Anesthesia Preprocedure Evaluation (Addendum)
Anesthesia Evaluation  Patient identified by MRN, date of birth, ID band Patient awake    Reviewed: Allergy & Precautions, NPO status , Patient's Chart, lab work & pertinent test results  History of Anesthesia Complications Negative for: history of anesthetic complications  Airway Mallampati: III  TM Distance: >3 FB Neck ROM: Full    Dental  (+) Caps   Pulmonary neg sleep apnea, neg COPD, former smoker,    breath sounds clear to auscultation- rhonchi (-) wheezing      Cardiovascular Exercise Tolerance: Good Hypertension: hx of HTN, but no longer on medication. (-) CAD and (-) Past MI  Rhythm:Regular Rate:Normal - Systolic murmurs and - Diastolic murmurs    Neuro/Psych negative neurological ROS  negative psych ROS   GI/Hepatic negative GI ROS, Neg liver ROS,   Endo/Other  negative endocrine ROSneg diabetes  Renal/GU negative Renal ROS     Musculoskeletal negative musculoskeletal ROS (+)   Abdominal (+) - obese,   Peds  Hematology negative hematology ROS (+)   Anesthesia Other Findings   Reproductive/Obstetrics                             Anesthesia Physical Anesthesia Plan  ASA: II  Anesthesia Plan: General   Post-op Pain Management:    Induction: Intravenous  Airway Management Planned: LMA  Additional Equipment:   Intra-op Plan:   Post-operative Plan:   Informed Consent: I have reviewed the patients History and Physical, chart, labs and discussed the procedure including the risks, benefits and alternatives for the proposed anesthesia with the patient or authorized representative who has indicated his/her understanding and acceptance.   Dental advisory given  Plan Discussed with: CRNA and Anesthesiologist  Anesthesia Plan Comments:        Anesthesia Quick Evaluation

## 2015-11-19 NOTE — Anesthesia Procedure Notes (Signed)
Procedure Name: LMA Insertion Date/Time: 11/19/2015 4:22 PM Performed by: Jonna Clark Pre-anesthesia Checklist: Patient identified, Patient being monitored, Timeout performed, Emergency Drugs available and Suction available Patient Re-evaluated:Patient Re-evaluated prior to inductionOxygen Delivery Method: Circle system utilized Preoxygenation: Pre-oxygenation with 100% oxygen Intubation Type: IV induction Ventilation: Mask ventilation without difficulty LMA: LMA inserted LMA Size: 4.5 Tube type: Oral Number of attempts: 1 Placement Confirmation: positive ETCO2 and breath sounds checked- equal and bilateral Tube secured with: Tape Dental Injury: Teeth and Oropharynx as per pre-operative assessment

## 2015-11-19 NOTE — H&P (Signed)
John Booker is an 65 y.o. male.    Chief Complaint: Perineal abscess  HPI: This patient with approximately 10 days of perineal pain which is worsening he was in the ED and placed on antibiotics but has worsened and is starting to drain. Started draining over the weekend. He's had fevers but no chills no nausea or vomiting like this before  History reviewed. No pertinent past medical history.  Past Surgical History:  Procedure Laterality Date  . MINOR HEMORRHOIDECTOMY  1989    Family History  Problem Relation Age of Onset  . Hypertension Father    Social History:  reports that he has quit smoking. His smoking use included Cigarettes. He has a 19.00 pack-year smoking history. He has never used smokeless tobacco. He reports that he does not drink alcohol or use drugs.  Allergies: No Known Allergies   (Not in a hospital admission)   Review of Systems  Constitutional: Positive for chills and fever. Negative for weight loss.  HENT: Negative.   Eyes: Negative.   Respiratory: Negative.   Cardiovascular: Negative.   Gastrointestinal: Negative.   Genitourinary: Negative.   Musculoskeletal: Negative.   Skin: Negative.   Neurological: Negative.   Endo/Heme/Allergies: Negative.   Psychiatric/Behavioral: Negative.      Physical Exam:  BP (!) 159/85 (BP Location: Right Arm)   Pulse 89   Temp 98.5 F (36.9 C) (Oral)   Resp 18   Ht 6' 3" (1.905 m)   Wt 215 lb (97.5 kg)   SpO2 99%   BMI 26.87 kg/m   Physical Exam  Constitutional: He is oriented to person, place, and time and well-developed, well-nourished, and in no distress. No distress.  HENT:  Head: Normocephalic and atraumatic.  Eyes: Right eye exhibits no discharge. Left eye exhibits no discharge. No scleral icterus.  Neck: Normal range of motion.  Cardiovascular: Normal rate, regular rhythm and normal heart sounds.   Pulmonary/Chest: Effort normal and breath sounds normal. No respiratory distress. He has no  wheezes. He has no rales.  Abdominal: Soft. He exhibits no distension. There is no tenderness. There is no rebound.  Genitourinary: Penis normal.  Genitourinary Comments: Massive swelling of the scrotum with discoloration and foul-smelling drainage from the posterior portion near the perineal body could tenderness present  Musculoskeletal: Normal range of motion. He exhibits no edema or tenderness.  Lymphadenopathy:    He has no cervical adenopathy.  Neurological: He is alert and oriented to person, place, and time.  Skin: Skin is warm and dry. No rash noted. He is not diaphoretic. No erythema.  Psychiatric: Mood and affect normal.  Vitals reviewed.       Results for orders placed or performed during the hospital encounter of 11/19/15 (from the past 48 hour(s))  CBC     Status: Abnormal   Collection Time: 11/19/15  8:26 AM  Result Value Ref Range   WBC 8.7 3.8 - 10.6 K/uL   RBC 4.50 4.40 - 5.90 MIL/uL   Hemoglobin 15.4 13.0 - 18.0 g/dL   HCT 44.0 40.0 - 52.0 %   MCV 97.7 80.0 - 100.0 fL   MCH 34.3 (H) 26.0 - 34.0 pg   MCHC 35.1 32.0 - 36.0 g/dL   RDW 14.2 11.5 - 14.5 %   Platelets 293 150 - 440 K/uL  Basic metabolic panel     Status: Abnormal   Collection Time: 11/19/15  8:26 AM  Result Value Ref Range   Sodium 132 (L) 135 - 145 mmol/L  Potassium 3.3 (L) 3.5 - 5.1 mmol/L   Chloride 93 (L) 101 - 111 mmol/L   CO2 28 22 - 32 mmol/L   Glucose, Bld 149 (H) 65 - 99 mg/dL   BUN 14 6 - 20 mg/dL   Creatinine, Ser 0.92 0.61 - 1.24 mg/dL   Calcium 8.9 8.9 - 10.3 mg/dL   GFR calc non Af Amer >60 >60 mL/min   GFR calc Af Amer >60 >60 mL/min    Comment: (NOTE) The eGFR has been calculated using the CKD EPI equation. This calculation has not been validated in all clinical situations. eGFR's persistently <60 mL/min signify possible Chronic Kidney Disease.    Anion gap 11 5 - 15   Ct Pelvis W Contrast  Result Date: 11/19/2015 CLINICAL DATA:  Acute perineal pain, swelling and  cellulitis for approximately 1 week. EXAM: CT PELVIS WITH CONTRAST TECHNIQUE: Multidetector CT imaging of the pelvis was performed using the standard protocol following the bolus administration of intravenous contrast. CONTRAST:  100 cc Isovue 370 COMPARISON:  Ultrasound examination 11/15/2015 FINDINGS: Urinary Tract: The bladder is normal. No mass or calculi. The prostate gland is unremarkable except for scattered calcifications. The seminal vesicles are normal. Bowel: The rectum and sigmoid colon are unremarkable. Scattered diverticulosis. The visualized small bowel loops are normal. The terminal ileum is normal. The appendix is normal. Vascular/Lymphatic: No pelvic lymphadenopathy. Small bilateral external iliac nodes and several bilateral inguinal lymph nodes. Other: There is a perianal fistula on the left side somewhere between 3 o'clock and 6 o'clock. There is a small adjacent abscess and this communicates with a large left perineal abscess. This measures a maximum of 10 x 4.5 cm. Associated skin thickening and surrounding cellulitis. Musculoskeletal: No significant bony findings. IMPRESSION: 1. Perianal fistula communicating with a large left perineal abscess. MRI pelvis without with contrast may be helpful for more definitive localization of the perianal fistula if necessary for pre-surgical planning. 2. No significant intrapelvic abnormalities. These results will be called to the ordering clinician or representative by the Radiologist Assistant, and communication documented in the PACS or zVision Dashboard. Electronically Signed   By: Marijo Sanes M.D.   On: 11/19/2015 13:08     Assessment/Plan  This patient with a perineal infection involving the scrotum largely. I recommended incision and drainage she has failed outpatient antibiotic therapy. I discussed with him the rationale for this approach and the risks of bleeding infection recurrence and the need for further operation the placement of either  [place] drains or an open wound that would require healing by secondary intention this is all reviewed for him he understood and agreed with this plan  Florene Glen, MD, FACS

## 2015-11-19 NOTE — ED Notes (Signed)
Seen here recently for cellulitis.  Says it got worse 2 days later.  Says the surgeon called him and he could not get there that day due to no ride and could not drive due to vicodin.  They did not make him another appt.  Says it started draining pus/blood over weekend.  Says he is having diff urinating due to swelling and pain. Says hart to get stream started.

## 2015-11-19 NOTE — ED Provider Notes (Signed)
[facility] Emergency Department Provider Note    First MD Initiated Contact with Patient 11/19/15 1028     (approximate)  I have reviewed the triage vital signs and the nursing notes.   HISTORY  Chief Complaint Groin Swelling    HPI John Booker is a 65 y.o. male who is 3/*on clindamycin for perennial cellulitis and pain presents for worsening pain and inability to sit. Patient denies any history of diabetes. Denies any history of receptive anal sex. Denies any dysuria previously but today due to the swelling and pain has not been able to urinate. Denies any history of prostatitis. Patient had an ultrasound of the scrotum performed last week which showed normal flow to the testes without scrotal abscess. Patient currently rates the pain as 10 out of 10 in severity and a throbbing aching pain.   History reviewed. No pertinent past medical history.  Patient Active Problem List   Diagnosis Date Noted  . Perineal abscess 11/15/2015  . HTN (hypertension) 11/15/2015  . Vitamin D deficiency 11/15/2015    Past Surgical History:  Procedure Laterality Date  . [place]    Prior to Admission medications   Medication Sig Start Date End Date Taking? Authorizing Provider  clindamycin (CLEOCIN) 150 MG capsule Take 2 capsules (300 mg total) by mouth 4 (four) times daily. 11/15/15   Johnn Hai, PA-C  HYDROcodone-acetaminophen (NORCO/VICODIN) 5-325 MG tablet Take 1-2 tablets by mouth every 4 (four) hours as needed for moderate pain. 11/15/15   Johnn Hai, PA-C    Allergies Review of patient's allergies indicates no known allergies.  Family History  Problem Relation Age of Onset  . Hypertension Father     Social History Social History  Substance Use Topics  . Smoking status: Former Smoker    Packs/day: 0.50    Years: 38.00    Types: Cigarettes  . Smokeless tobacco: Never Used  . Alcohol use No    Review of  Systems Patient denies headaches, rhinorrhea, blurry vision, numbness, shortness of breath, chest pain, edema, cough, abdominal pain, nausea, vomiting, diarrhea, dysuria, fevers, rashes or hallucinations unless otherwise stated above in HPI. ____________________________________________   PHYSICAL EXAM:  VITAL SIGNS: Vitals:   11/19/15 0823 11/19/15 1026  BP: (!) 178/89 (!) 168/85  Pulse: 95 78  Resp: 18 16  Temp: 98.5 F (36.9 C)     Constitutional: Alert and oriented. Well appearing and in no acute distress. Eyes: Conjunctivae are normal. PERRL. EOMI. Head: Atraumatic. Nose: No congestion/rhinnorhea. Mouth/Throat: Mucous membranes are moist.  Oropharynx non-erythematous. Neck: No stridor. Painless ROM. No cervical spine tenderness to palpation Hematological/Lymphatic/Immunilogical: No cervical lymphadenopathy. Cardiovascular: Normal rate, regular rhythm. Grossly normal heart sounds.  Good peripheral circulation. Respiratory: Normal respiratory effort.  No retractions. Lungs CTAB. Gastrointestinal: Soft and nontender. No distention. No abdominal bruits. No CVA tenderness. Genitourinary:  Large erythematous and draining perineal abscess with scrotal erythema. Musculoskeletal: No lower extremity tenderness nor edema.  No joint effusions. Neurologic:  Normal speech and language. No gross focal neurologic deficits are appreciated. No gait instability. Skin:  Skin is warm, dry and intact. No rash noted. Psychiatric: Mood and affect are normal. Speech and behavior are normal.  ____________________________________________   LABS (all labs ordered are listed, but only abnormal results are displayed)  Results for orders placed or performed during the hospital encounter of 11/19/15 (from the past 24 hour(s))  CBC     Status: Abnormal   Collection Time: 11/19/15  8:26 AM  Result Value Ref Range   WBC 8.7 3.8 - 10.6 K/uL   RBC 4.50 4.40 - 5.90 MIL/uL   Hemoglobin 15.4 13.0 - 18.0 g/dL    HCT 44.0 40.0 - 52.0 %   MCV 97.7 80.0 - 100.0 fL   MCH 34.3 (H) 26.0 - 34.0 pg   MCHC 35.1 32.0 - 36.0 g/dL   RDW 14.2 11.5 - 14.5 %   Platelets 293 150 - 440 K/uL  Basic metabolic panel     Status: Abnormal   Collection Time: 11/19/15  8:26 AM  Result Value Ref Range   Sodium 132 (L) 135 - 145 mmol/L   Potassium 3.3 (L) 3.5 - 5.1 mmol/L   Chloride 93 (L) 101 - 111 mmol/L   CO2 28 22 - 32 mmol/L   Glucose, Bld 149 (H) 65 - 99 mg/dL   BUN 14 6 - 20 mg/dL   Creatinine, Ser 0.92 0.61 - 1.24 mg/dL   Calcium 8.9 8.9 - 10.3 mg/dL   GFR calc non Af Amer >60 >60 mL/min   GFR calc Af Amer >60 >60 mL/min   Anion gap 11 5 - 15   ____________________________________________  ____________________________________________  RADIOLOGY  CT pelvis IMPRESSION: 1. Perianal fistula communicating with a large left perineal abscess. MRI pelvis without with contrast may be helpful for more definitive localization of the perianal fistula if necessary for pre-surgical planning. 2. No significant intrapelvic abnormalities. These results will be called to the ordering clinician or representative by the Radiologist Assistant, and communication documented in the PACS or zVision Dashboard.  ____________________________________________   PROCEDURES  Procedure(s) performed: none    Critical Care performed: no ____________________________________________   INITIAL IMPRESSION / ASSESSMENT AND PLAN / ED COURSE  Pertinent labs & imaging results that were available during my care of the patient were reviewed by me and considered in my medical decision making (see chart for details).  DDX: fournier's, cellulitis, perirectal fistula, perineal abscess, NSTI  John Booker is a 65 y.o. who presents to the ED with A very large. Radial abscess and cellulitis extending from the posterior scrotum to the anterior rectum. He is afebrile and hemodynamically stable but does have very large purulent  draining abscess. Given its location and do suspect this is more likely perirectal abscess general surgery's consult for operative management CT imaging ordered to evaluate for deep space infection and possible fistula. Patient given broad-spectrum antibiotics. Pain control was achieved with IV medications.  Have discussed with the patient and available family all diagnostics and treatments performed thus far and all questions were answered to the best of my ability. The patient demonstrates understanding and agreement with plan.   Clinical Course     ____________________________________________   FINAL CLINICAL IMPRESSION(S) / ED DIAGNOSES  Final diagnoses:  Perineal abscess  Abnormal CT scan  Rectal fistula      NEW MEDICATIONS STARTED DURING THIS VISIT:  New Prescriptions   No medications on file     Note:  This document was prepared using Dragon voice recognition software and may include unintentional dictation errors.    Merlyn Lot, MD 11/19/15 [phone]

## 2015-11-20 ENCOUNTER — Inpatient Hospital Stay: Payer: Medicare Other | Admitting: Anesthesiology

## 2015-11-20 ENCOUNTER — Encounter
Admission: EM | Disposition: A | Discharge status: Skilled Nursing Facility | Payer: Self-pay | Source: Home / Self Care | Attending: Surgery

## 2015-11-20 ENCOUNTER — Encounter: Payer: Self-pay | Admitting: Surgery

## 2015-11-20 HISTORY — PX: INCISION AND DRAINAGE PERIRECTAL ABSCESS: SHX1804

## 2015-11-20 LAB — CBC
HCT: 37.6 % — ABNORMAL LOW (ref 40.0–52.0)
Hemoglobin: 13.1 g/dL (ref 13.0–18.0)
MCH: 34.3 pg — ABNORMAL HIGH (ref 26.0–34.0)
MCHC: 34.8 g/dL (ref 32.0–36.0)
MCV: 98.5 fL (ref 80.0–100.0)
PLATELETS: 264 10*3/uL (ref 150–440)
RBC: 3.81 MIL/uL — ABNORMAL LOW (ref 4.40–5.90)
RDW: 14.2 % (ref 11.5–14.5)
WBC: 7.2 10*3/uL (ref 3.8–10.6)

## 2015-11-20 LAB — CULTURE, BLOOD (ROUTINE X 2)
Culture: NO GROWTH
Culture: NO GROWTH

## 2015-11-20 LAB — BASIC METABOLIC PANEL
Anion gap: 6 (ref 5–15)
BUN: 11 mg/dL (ref 6–20)
CALCIUM: 8.3 mg/dL — AB (ref 8.9–10.3)
CO2: 31 mmol/L (ref 22–32)
CREATININE: 0.83 mg/dL (ref 0.61–1.24)
Chloride: 95 mmol/L — ABNORMAL LOW (ref 101–111)
GFR calc Af Amer: >60 mL/min (ref >60)
GLUCOSE: 208 mg/dL — AB (ref 65–99)
POTASSIUM: 4.1 mmol/L (ref 3.5–5.1)
SODIUM: 132 mmol/L — AB (ref 135–145)

## 2015-11-20 SURGERY — INCISION AND DRAINAGE, ABSCESS, PERIRECTAL
Anesthesia: General | Site: Anus | Wound class: Dirty or Infected

## 2015-11-20 MED ORDER — PROPOFOL 10 MG/ML IV BOLUS
INTRAVENOUS | Status: DC | PRN
  Administered 2015-11-20: 50 mg via INTRAVENOUS

## 2015-11-20 MED ORDER — ONDANSETRON HCL 4 MG/2ML IJ SOLN
INTRAMUSCULAR | Status: DC | PRN
  Administered 2015-11-20: 4 mg via INTRAVENOUS

## 2015-11-20 MED ORDER — VANCOMYCIN HCL 10 G IV SOLR
1500.0000 mg | Freq: Two times a day (BID) | INTRAVENOUS | Status: DC
  Administered 2015-11-20 – 2015-11-22 (×5): 1500 mg via INTRAVENOUS
  Filled 2015-11-20 (×6): qty 1500

## 2015-11-20 MED ORDER — FENTANYL CITRATE (PF) 100 MCG/2ML IJ SOLN
INTRAMUSCULAR | Status: DC | PRN
  Administered 2015-11-20: 50 ug via INTRAVENOUS
  Administered 2015-11-20 (×2): 25 ug via INTRAVENOUS
  Administered 2015-11-20: 50 ug via INTRAVENOUS

## 2015-11-20 MED ORDER — SILVER SULFADIAZINE 1 % EX CREA
TOPICAL_CREAM | CUTANEOUS | Status: DC | PRN
  Administered 2015-11-20: 2 via TOPICAL

## 2015-11-20 MED ORDER — SILVER SULFADIAZINE 1 % EX CREA
TOPICAL_CREAM | CUTANEOUS | Status: AC
  Filled 2015-11-20: qty 170

## 2015-11-20 SURGICAL SUPPLY — 25 items
BLADE SURG 15 STRL LF DISP TIS (BLADE) ×1 IMPLANT
BLADE SURG 15 STRL SS (BLADE) ×3
BLADE SURG SZ11 CARB STEEL (BLADE) ×3 IMPLANT
BNDG GAUZE 4.5X4.1 6PLY STRL (MISCELLANEOUS) ×3 IMPLANT
BRIEF STRETCH MATERNITY 2XLG (MISCELLANEOUS) ×3 IMPLANT
CANISTER SUCT 1200ML W/VALVE (MISCELLANEOUS) ×3 IMPLANT
DRAIN PENROSE 1/4X12 LTX (DRAIN) IMPLANT
DRAPE LAPAROTOMY 100X77 ABD (DRAPES) ×3 IMPLANT
DRAPE LEGGINS SURG 28X43 STRL (DRAPES) ×3 IMPLANT
DRAPE UNDER BUTTOCK W/FLU (DRAPES) ×3 IMPLANT
GAUZE IODOFORM PACK 1/2 7832 (GAUZE/BANDAGES/DRESSINGS) IMPLANT
GAUZE SPONGE 4X4 12PLY STRL (GAUZE/BANDAGES/DRESSINGS) IMPLANT
GLOVE BIO SURGEON STRL SZ8 (GLOVE) ×6 IMPLANT
GOWN STRL REUS W/ TWL LRG LVL3 (GOWN DISPOSABLE) ×2 IMPLANT
GOWN STRL REUS W/TWL LRG LVL3 (GOWN DISPOSABLE) ×6
KIT RM TURNOVER STRD PROC AR (KITS) ×3 IMPLANT
LABEL OR SOLS (LABEL) ×3 IMPLANT
NS IRRIG 500ML POUR BTL (IV SOLUTION) ×3 IMPLANT
PACK BASIN MINOR ARMC (MISCELLANEOUS) ×3 IMPLANT
PAD ABD DERMACEA PRESS 5X9 (GAUZE/BANDAGES/DRESSINGS) ×6 IMPLANT
PREP PVP WINGED SPONGE (MISCELLANEOUS) IMPLANT
SUT ETH BLK MONO 3 0 FS 1 12/B (SUTURE) ×3 IMPLANT
SUT NYLON 2-0 (SUTURE) IMPLANT
SWAB CULTURE AMIES ANAERIB BLU (MISCELLANEOUS) IMPLANT
SYR BULB IRRIG 60ML STRL (SYRINGE) ×3 IMPLANT

## 2015-11-20 NOTE — Anesthesia Postprocedure Evaluation (Signed)
Anesthesia Post Note  Patient: John Booker  Procedure(s) Performed: Procedure(s) (LRB): IRRIGATION AND DEBRIDEMENT PERIRECTAL ABSCESS / WITH DRESSING CHANGE (N/A)  Patient location during evaluation: PACU Anesthesia Type: General Level of consciousness: awake and alert Pain management: pain level controlled Vital Signs Assessment: post-procedure vital signs reviewed and stable Respiratory status: spontaneous breathing and respiratory function stable Cardiovascular status: stable Anesthetic complications: no    Last Vitals:  Vitals:   11/20/15 1023 11/20/15 1038  BP: (!) 161/81 (!) 159/95  Pulse: 65 78  Resp: 18 17  Temp:  36.7 C    Last Pain:  Vitals:   11/20/15 0853  TempSrc: Tympanic  PainSc: 4                  Darryel Diodato K

## 2015-11-20 NOTE — Progress Notes (Signed)
1 Day Post-Op  Subjective: Status post I&D and drainage and debridement of a perirectal perineal scrotal abscess. He has a complex wound but is feeling well  Objective: Vital signs in last 24 hours: Temp:  [97.6 F (36.4 C)-99.1 F (37.3 C)] 97.6 F (36.4 C) (09/12 0456) Pulse Rate:  [62-95] 62 (09/12 0456) Resp:  [14-26] 18 (09/12 0456) BP: (153-178)/(71-104) 155/79 (09/12 0456) SpO2:  [93 %-100 %] 100 % (09/12 0456) Weight:  [215 lb (97.5 kg)] 215 lb (97.5 kg) (09/11 0824)    Intake/Output from previous day: 09/11 0701 - 09/12 0700 In: 1898.1 [P.O.:100; I.V.:1798.1] Out: 1730 [Urine:1580; Blood:150] Intake/Output this shift: No intake/output data recorded.  Physical exam:  No exam was performed today  Lab Results: CBC   Recent Labs  11/19/15 0826 11/20/15 0509  WBC 8.7 7.2  HGB 15.4 13.1  HCT 44.0 37.6*  PLT 293 264   BMET  Recent Labs  11/19/15 0826 11/20/15 0509  NA 132* 132*  K 3.3* 4.1  CL 93* 95*  CO2 28 31  GLUCOSE 149* 208*  BUN 14 11  CREATININE 0.92 0.83  CALCIUM 8.9 8.3*   PT/INR No results for input(s): LABPROT, INR in the last 72 hours. ABG No results for input(s): PHART, HCO3 in the last 72 hours.  Invalid input(s): PCO2, PO2  Studies/Results: Ct Pelvis W Contrast  Result Date: 11/19/2015 CLINICAL DATA:  Acute perineal pain, swelling and cellulitis for approximately 1 week. EXAM: CT PELVIS WITH CONTRAST TECHNIQUE: Multidetector CT imaging of the pelvis was performed using the standard protocol following the bolus administration of intravenous contrast. CONTRAST:  100 cc Isovue 370 COMPARISON:  Ultrasound examination 11/15/2015 FINDINGS: Urinary Tract: The bladder is normal. No mass or calculi. The prostate gland is unremarkable except for scattered calcifications. The seminal vesicles are normal. Bowel: The rectum and sigmoid colon are unremarkable. Scattered diverticulosis. The visualized small bowel loops are normal. The terminal ileum  is normal. The appendix is normal. Vascular/Lymphatic: No pelvic lymphadenopathy. Small bilateral external iliac nodes and several bilateral inguinal lymph nodes. Other: There is a perianal fistula on the left side somewhere between 3 o'clock and 6 o'clock. There is a small adjacent abscess and this communicates with a large left perineal abscess. This measures a maximum of 10 x 4.5 cm. Associated skin thickening and surrounding cellulitis. Musculoskeletal: No significant bony findings. IMPRESSION: 1. Perianal fistula communicating with a large left perineal abscess. MRI pelvis without with contrast may be helpful for more definitive localization of the perianal fistula if necessary for pre-surgical planning. 2. No significant intrapelvic abnormalities. These results will be called to the ordering clinician or representative by the Radiologist Assistant, and communication documented in the PACS or zVision Dashboard. Electronically Signed   By: Marijo Sanes M.D.   On: 11/19/2015 13:08    Anti-infectives: Anti-infectives    Start     Dose/Rate Route Frequency Ordered Stop   11/19/15 2000  vancomycin (VANCOCIN) IVPB 1000 mg/200 mL premix     1,000 mg 200 mL/hr over 60 Minutes Intravenous Every 12 hours 11/19/15 1707     11/19/15 1900  piperacillin-tazobactam (ZOSYN) IVPB 3.375 g     3.375 g 12.5 mL/hr over 240 Minutes Intravenous Every 8 hours 11/19/15 1835     11/19/15 1715  piperacillin-tazobactam (ZOSYN) IVPB 3.375 g  Status:  Discontinued     3.375 g 100 mL/hr over 30 Minutes Intravenous Every 8 hours 11/19/15 1707 11/19/15 1834   11/19/15 1445  vancomycin (VANCOCIN) IVPB  1000 mg/200 mL premix     1,000 mg 200 mL/hr over 60 Minutes Intravenous  Once 11/19/15 1357 11/19/15 1646   11/19/15 1100  piperacillin-tazobactam (ZOSYN) IVPB 3.375 g     3.375 g 100 mL/hr over 30 Minutes Intravenous  Once 11/19/15 1047 11/19/15 1204   11/19/15 1100  clindamycin (CLEOCIN) IVPB 900 mg     900 mg 100 mL/hr  over 30 Minutes Intravenous  Once 11/19/15 1047 11/19/15 1346      Assessment/Plan: s/p Procedure(s): IRRIGATION AND DEBRIDEMENT PERIRECTAL ABSCESS and debridement of scrotal abscess   Examination deferred until they can be done in the operating room. I discussed with his wife last night and then with the patient this morning the rationale for examination under anesthesia and re-debridement and dressing change in the OR if necessary for re-debridement etc. The rationale for this was discussed the options were reviewed as were the risks of bleeding and recurrent infection and open wound he understood and agreed to proceed I will schedule him for surgery later on today.  Florene Glen, MD, FACS  11/20/2015

## 2015-11-20 NOTE — Anesthesia Procedure Notes (Signed)
Procedure Name: LMA Insertion Date/Time: 11/20/2015 9:21 AM Performed by: Jonna Clark Pre-anesthesia Checklist: Patient identified, Patient being monitored, Timeout performed, Emergency Drugs available and Suction available Patient Re-evaluated:Patient Re-evaluated prior to inductionOxygen Delivery Method: Circle system utilized Preoxygenation: Pre-oxygenation with 100% oxygen Intubation Type: IV induction Ventilation: Mask ventilation without difficulty LMA: LMA inserted LMA Size: 4.5 Tube type: Oral Number of attempts: 1 Placement Confirmation: positive ETCO2 and breath sounds checked- equal and bilateral Tube secured with: Tape Dental Injury: Teeth and Oropharynx as per pre-operative assessment

## 2015-11-20 NOTE — Progress Notes (Signed)
Inpatient Diabetes Program Recommendations  AACE/ADA: New Consensus Statement on Inpatient Glycemic Control (2015)  Target Ranges:  Prepandial:   less than 140 mg/dL      Peak postprandial:   less than 180 mg/dL (1-2 hours)      Critically ill patients:  140 - 180 mg/dL  Results for John Booker, John Booker (MRN [id number]) as of 11/20/2015 09:22  Ref. Range 11/15/2015 11:51 11/19/2015 08:26 11/20/2015 05:09  Glucose Latest Ref Range: 65 - 99 mg/dL 235 (H) 149 (H) 208 (H)    Review of Glycemic Control  Diabetes history: No Outpatient Diabetes medications: NA Current orders for Inpatient glycemic control: None  Inpatient Diabetes Program Recommendations: Correction (SSI): While inpatient, please consider ordering CBGs with Novolog sensitive correction scale. HgbA1C: Please order an A1C to evaluate glycemic control over the past 2-3 months.  NOTE: In reviewing chart, noted patient has no history of diabetes. Glucose 149 mg/dl on 11/19/15 at 8:26 am and 208 mg/dl today at 5:09 am. Noted patient did receive Decadron 10 mg on 11/19/15 at 16:34 which is contributing to hyperglycemia. Also noted a random glucose of 235 mg/dl on 11/15/15 at 11:51 am. Recommend checking an A1C to evaluate glycemic control over the past 2-3 months and if continues to be inpatient order CBGs with Novolog correction scale.  Thanks, Barnie Alderman, RN, MSN, CDE Diabetes Coordinator Inpatient Diabetes Program [phone] (Team Pager from [place] to [place]) [phone] ([facility] office) [phone] [facility] office) [phone] [facility] office)

## 2015-11-20 NOTE — Transfer of Care (Signed)
Immediate Anesthesia Transfer of Care Note  Patient: John Booker  Procedure(s) Performed: Procedure(s): IRRIGATION AND DEBRIDEMENT PERIRECTAL ABSCESS / WITH DRESSING CHANGE (N/A)  Patient Location: PACU  Anesthesia Type:General  Level of Consciousness: awake, alert  and oriented  Airway & Oxygen Therapy: Patient Spontanous Breathing and Patient connected to face mask oxygen  Post-op Assessment: Report given to RN and Post -op Vital signs reviewed and stable  Post vital signs: Reviewed and stable  Last Vitals:  Vitals:   11/20/15 0853 11/20/15 0953  BP: (!) 162/84 (!) 144/71  Pulse: 65 69  Resp: 18 10  Temp: 36.2 C 36.4 C    Last Pain:  Vitals:   11/20/15 0853  TempSrc: Tympanic  PainSc: 4          Complications: No apparent anesthesia complications

## 2015-11-20 NOTE — Progress Notes (Signed)
Pharmacy Antibiotic Note  John Booker is a 65 y.o. male admitted on 11/19/2015 with wound infection.  Pharmacy has been consulted for vancomycin dosing.  Plan: Patient was started on vancomycin 1000 mg IV q12h, which results in an estimated trough of ~ 9 mcg/mL which would be subtherapeutic.   Will increase dose to vancomycin 1500 mg IV q12h and check trough prior to the 5th dose on new regimen Goal vancomycin trough 15-20 mcg/mL Vancomycin trough scheduled for 9/14 @ 1930 which is prior to 5th dose and should represent steady state  Kinetics: Using actual body weight of 97.5 kg Ke: 0.087 Half-life: ~8 hours Vd: 68 L  Cmin (est) ~ 15 mcg/mL  Height: 6\' 3"  (190.5 cm) Weight: 215 lb (97.5 kg) IBW/kg (Calculated) : 84.5  Temp (24hrs), Avg:98.1 F (36.7 C), Min:97.1 F (36.2 C), Max:99.1 F (37.3 C)   Recent Labs Lab 11/15/15 1151 11/19/15 0826 11/20/15 0509  WBC 12.2* 8.7 7.2  CREATININE 1.12 0.92 0.83    Estimated Creatinine Clearance: 104.6 mL/min (by C-G formula based on SCr of 0.83 mg/dL).    No Known Allergies  Antimicrobials this admission: vancomycin 9/11 >>  Piperacillin/tazobactam 9/11 >>   Dose adjustments this admission: 9/12 - Dose increased from vancomycin 1000 mg IV q12h to 1500 mg IV q12h  Microbiology results:  Thank you for allowing pharmacy to be a part of this patient's care.  Lenis Noon, PharmD, BCPS Clinical Pharmacist 11/20/2015 11:53 AM

## 2015-11-20 NOTE — Anesthesia Preprocedure Evaluation (Signed)
Anesthesia Evaluation  Patient identified by MRN, date of birth, ID band Patient awake    Reviewed: Allergy & Precautions, NPO status , Patient's Chart, lab work & pertinent test results  History of Anesthesia Complications Negative for: history of anesthetic complications  Airway Mallampati: III  TM Distance: >3 FB Neck ROM: Full    Dental  (+) Caps   Pulmonary neg sleep apnea, neg COPD, former smoker,    breath sounds clear to auscultation- rhonchi (-) wheezing      Cardiovascular Exercise Tolerance: Good Hypertension: hx of HTN, but no longer on medication. (-) CAD and (-) Past MI  Rhythm:Regular Rate:Normal - Systolic murmurs and - Diastolic murmurs    Neuro/Psych negative neurological ROS  negative psych ROS   GI/Hepatic negative GI ROS, Neg liver ROS,   Endo/Other  negative endocrine ROSneg diabetes  Renal/GU negative Renal ROS     Musculoskeletal negative musculoskeletal ROS (+)   Abdominal (+) - obese,   Peds  Hematology negative hematology ROS (+)   Anesthesia Other Findings   Reproductive/Obstetrics                             Anesthesia Physical  Anesthesia Plan  ASA: II  Anesthesia Plan: General   Post-op Pain Management:    Induction: Intravenous  Airway Management Planned: LMA  Additional Equipment:   Intra-op Plan:   Post-operative Plan:   Informed Consent: I have reviewed the patients History and Physical, chart, labs and discussed the procedure including the risks, benefits and alternatives for the proposed anesthesia with the patient or authorized representative who has indicated his/her understanding and acceptance.   Dental advisory given  Plan Discussed with: CRNA and Anesthesiologist  Anesthesia Plan Comments:         Anesthesia Quick Evaluation

## 2015-11-20 NOTE — Op Note (Signed)
11/19/2015 - 11/20/2015  9:45 AM  PATIENT:  John Booker  65 y.o. male  PRE-OPERATIVE DIAGNOSIS:  Necrotizing perineal infection  POST-OPERATIVE DIAGNOSIS:  Same  PROCEDURE: Examination under anesthesia and ref debridement of perineal wound  SURGEON:  Florene Glen MD, FACS   ANESTHESIA:  Gen. with endotracheal tube   Details of Procedure: This patient status post incision and drainage of a perineal and perirectal abscess yesterday he is brought back to the operating room for examination under anesthesia and further debridement if necessary. As well as dressing changes. Preoperatively discussed rationale for surgery the options of observation risk bleeding and ongoing or recurrent infection as well as open wound he understood and agreed to proceed.  Patient was discharged to general anesthesia a surgical cause was performed he was placed in high lithotomy position and prepped and draped in a sterile fashion utilizing Hibiclens sponges the wound was roughly debrided no sharp instruments were required to purulent material was removed from the open wound and flushing with water was performed. No further debridement other than the rough debridement was necessary.  The wound was redressed with Silvadene dressing and Kerlix and an AVD pads. He was taken to recovery room in stable condition to be admitted for continued IV antibiotics.   Florene Glen, MD FACS

## 2015-11-20 NOTE — Progress Notes (Signed)
Discussed the findings today and the need for debridement tomorrow and a dressing change. This was reviewed with him and the rationale was reviewed and risks were reviewed in detail again as had been previously discussed. He understood and agreed to proceed

## 2015-11-21 ENCOUNTER — Inpatient Hospital Stay: Payer: Medicare Other | Admitting: Anesthesiology

## 2015-11-21 ENCOUNTER — Encounter
Admission: EM | Disposition: A | Discharge status: Skilled Nursing Facility | Payer: Self-pay | Source: Home / Self Care | Attending: Surgery

## 2015-11-21 ENCOUNTER — Encounter: Payer: Self-pay | Admitting: Surgery

## 2015-11-21 DIAGNOSIS — K611 Rectal abscess: Principal | ICD-10-CM

## 2015-11-21 HISTORY — PX: INCISION AND DRAINAGE PERIRECTAL ABSCESS: SHX1804

## 2015-11-21 LAB — SURGICAL PATHOLOGY

## 2015-11-21 SURGERY — INCISION AND DRAINAGE, ABSCESS, PERIRECTAL
Anesthesia: General | Wound class: Dirty or Infected

## 2015-11-21 MED ORDER — SILVER SULFADIAZINE 1 % EX CREA
TOPICAL_CREAM | CUTANEOUS | Status: AC
  Filled 2015-11-21: qty 85

## 2015-11-21 MED ORDER — FENTANYL CITRATE (PF) 100 MCG/2ML IJ SOLN
INTRAMUSCULAR | Status: DC | PRN
  Administered 2015-11-21 (×2): 25 ug via INTRAVENOUS

## 2015-11-21 MED ORDER — PROPOFOL 10 MG/ML IV BOLUS
INTRAVENOUS | Status: DC | PRN
  Administered 2015-11-21: 160 mg via INTRAVENOUS

## 2015-11-21 MED ORDER — SILVER SULFADIAZINE 1 % EX CREA
TOPICAL_CREAM | CUTANEOUS | Status: DC | PRN
  Administered 2015-11-21: 2 via TOPICAL

## 2015-11-21 MED ORDER — HYDRALAZINE HCL 20 MG/ML IJ SOLN
INTRAMUSCULAR | Status: AC
  Filled 2015-11-21: qty 1

## 2015-11-21 MED ORDER — MIDAZOLAM HCL 2 MG/2ML IJ SOLN
INTRAMUSCULAR | Status: DC | PRN
  Administered 2015-11-21: 2 mg via INTRAVENOUS

## 2015-11-21 MED ORDER — LIDOCAINE HCL (CARDIAC) 20 MG/ML IV SOLN
INTRAVENOUS | Status: DC | PRN
  Administered 2015-11-21: 100 mg via INTRAVENOUS

## 2015-11-21 MED ORDER — OXYCODONE HCL 5 MG PO TABS: 5.0000 mg | ORAL_TABLET | ORAL | Status: DC | PRN

## 2015-11-21 MED ORDER — HYDRALAZINE HCL 20 MG/ML IJ SOLN
10.0000 mg | Freq: Once | INTRAMUSCULAR | Status: AC
  Administered 2015-11-21: 10 mg via INTRAVENOUS
  Filled 2015-11-21: qty 0.5

## 2015-11-21 MED ORDER — KETOROLAC TROMETHAMINE 30 MG/ML IJ SOLN
30.0000 mg | Freq: Four times a day (QID) | INTRAMUSCULAR | Status: DC
  Administered 2015-11-21 – 2015-11-23 (×7): 30 mg via INTRAVENOUS
  Filled 2015-11-21 (×7): qty 1

## 2015-11-21 MED ORDER — SODIUM CHLORIDE FLUSH 0.9 % IV SOLN
INTRAVENOUS | Status: AC
  Filled 2015-11-21: qty 10

## 2015-11-21 MED ORDER — ONDANSETRON HCL 4 MG/2ML IJ SOLN
INTRAMUSCULAR | Status: DC | PRN
  Administered 2015-11-21: 4 mg via INTRAVENOUS

## 2015-11-21 MED ORDER — ACETAMINOPHEN 325 MG PO TABS
650.0000 mg | ORAL_TABLET | Freq: Four times a day (QID) | ORAL | Status: DC
  Administered 2015-11-21 – 2015-11-23 (×6): 650 mg via ORAL
  Filled 2015-11-21 (×6): qty 2

## 2015-11-21 MED ORDER — ONDANSETRON HCL 4 MG/2ML IJ SOLN: 4.0000 mg | Freq: Once | INTRAMUSCULAR | Status: DC | PRN

## 2015-11-21 MED ORDER — HYDROMORPHONE HCL 1 MG/ML IJ SOLN
1.0000 mg | INTRAMUSCULAR | Status: DC | PRN
  Administered 2015-11-21: 1 mg via INTRAVENOUS

## 2015-11-21 MED ORDER — LACTATED RINGERS IV SOLN
INTRAVENOUS | Status: DC
  Administered 2015-11-21: 14:00:00 via INTRAVENOUS

## 2015-11-21 MED ORDER — FENTANYL CITRATE (PF) 100 MCG/2ML IJ SOLN: 25.0000 ug | INTRAMUSCULAR | Status: DC | PRN

## 2015-11-21 SURGICAL SUPPLY — 25 items
BLADE SURG 15 STRL LF DISP TIS (BLADE) ×2 IMPLANT
BLADE SURG 15 STRL SS (BLADE) ×6
BLADE SURG SZ11 CARB STEEL (BLADE) IMPLANT
BRIEF STRETCH MATERNITY 2XLG (MISCELLANEOUS) ×3 IMPLANT
CANISTER SUCT 1200ML W/VALVE (MISCELLANEOUS) ×3 IMPLANT
DRAIN PENROSE 1/4X12 LTX (DRAIN) IMPLANT
DRAPE LAPAROTOMY 100X77 ABD (DRAPES) ×3 IMPLANT
DRAPE LEGGINS SURG 28X43 STRL (DRAPES) IMPLANT
DRAPE UNDER BUTTOCK W/FLU (DRAPES) ×3 IMPLANT
GAUZE IODOFORM PACK 1/2 7832 (GAUZE/BANDAGES/DRESSINGS) IMPLANT
GAUZE SPONGE 4X4 12PLY STRL (GAUZE/BANDAGES/DRESSINGS) IMPLANT
GLOVE BIO SURGEON STRL SZ8 (GLOVE) ×12 IMPLANT
GOWN STRL REUS W/ TWL LRG LVL3 (GOWN DISPOSABLE) ×2 IMPLANT
GOWN STRL REUS W/TWL LRG LVL3 (GOWN DISPOSABLE) ×6
KIT RM TURNOVER STRD PROC AR (KITS) ×3 IMPLANT
LABEL OR SOLS (LABEL) IMPLANT
NS IRRIG 500ML POUR BTL (IV SOLUTION) ×3 IMPLANT
PACK BASIN MINOR ARMC (MISCELLANEOUS) ×3 IMPLANT
PAD ABD DERMACEA PRESS 5X9 (GAUZE/BANDAGES/DRESSINGS) ×6 IMPLANT
PREP PVP WINGED SPONGE (MISCELLANEOUS) IMPLANT
SPONGE LAP 18X18 5 PK (GAUZE/BANDAGES/DRESSINGS) ×3 IMPLANT
SUT ETH BLK MONO 3 0 FS 1 12/B (SUTURE) IMPLANT
SUT NYLON 2-0 (SUTURE) IMPLANT
SWAB CULTURE AMIES ANAERIB BLU (MISCELLANEOUS) IMPLANT
SYRINGE 10CC LL (SYRINGE) ×3 IMPLANT

## 2015-11-21 NOTE — Anesthesia Preprocedure Evaluation (Signed)
Anesthesia Evaluation  Patient identified by MRN, date of birth, ID band Patient awake    Reviewed: Allergy & Precautions, NPO status , Patient's Chart, lab work & pertinent test results  History of Anesthesia Complications Negative for: history of anesthetic complications  Airway Mallampati: III  TM Distance: >3 FB Neck ROM: Full    Dental  (+) Caps   Pulmonary neg sleep apnea, neg COPD, former smoker,    breath sounds clear to auscultation- rhonchi (-) wheezing      Cardiovascular Exercise Tolerance: Good Hypertension: hx of HTN, but no longer on medication. (-) CAD and (-) Past MI  Rhythm:Regular Rate:Normal - Systolic murmurs and - Diastolic murmurs    Neuro/Psych negative neurological ROS  negative psych ROS   GI/Hepatic negative GI ROS, Neg liver ROS,   Endo/Other  negative endocrine ROSneg diabetes  Renal/GU negative Renal ROS     Musculoskeletal negative musculoskeletal ROS (+)   Abdominal (+) - obese,   Peds  Hematology negative hematology ROS (+)   Anesthesia Other Findings   Reproductive/Obstetrics                             Anesthesia Physical  Anesthesia Plan  ASA: II  Anesthesia Plan: General   Post-op Pain Management:    Induction: Intravenous  Airway Management Planned: LMA  Additional Equipment:   Intra-op Plan:   Post-operative Plan:   Informed Consent: I have reviewed the patients History and Physical, chart, labs and discussed the procedure including the risks, benefits and alternatives for the proposed anesthesia with the patient or authorized representative who has indicated his/her understanding and acceptance.   Dental advisory given  Plan Discussed with: CRNA and Anesthesiologist  Anesthesia Plan Comments:         Anesthesia Quick Evaluation

## 2015-11-21 NOTE — Progress Notes (Signed)
Patient feels well this morning he has no complaints. I'll signs are reviewed. Dressing is intact as his Foley catheter  Planning return to the operating room this afternoon for dressing change under anesthesia with EUA and will remove Foley catheter at that time. Risks reviewed in detail with the patient

## 2015-11-21 NOTE — Op Note (Signed)
11/19/2015 - 11/21/2015  2:37 PM  PATIENT:  [name]  65 y.o. male  PRE-OPERATIVE DIAGNOSIS: Perineal abscess  POST-OPERATIVE DIAGNOSIS:  Same  PROCEDURE: Examination under anesthesia and dressing change of perineal wound  SURGEON:  Florene Glen MD, FACS   ANESTHESIA:   Gen. with LMA   Details of Procedure: This patient with a large perineal wound requiring examination under anesthesia and dressing change. Preoperatively we discussed the rationale for this procedure and the risks as discussed previously.  Patient was due to general anesthesia placed in high lithotomy position he was then prepped and draped in a sterile fashion utilizing Hibiclens sponges the wound was cleansed and no fibber no purulent material was recognized at this point tissue is granulating in some areas. The Penrose drain was still present in the posterior located tract. A Silvadene dressing with Kerlix was then placed followed by AVD pads.  Patient hour to the procedure well the workup complications he was taken to recovery room in stable condition to be admitted for continued IV antibiotic therapy   Florene Glen, MD FACS

## 2015-11-21 NOTE — Transfer of Care (Signed)
Immediate Anesthesia Transfer of Care Note  Patient: John Booker  Procedure(s) Performed: Procedure(s): IRRIGATION AND DEBRIDEMENT PERIRECTAL ABSCESS WITH DRESSING CHANGE (N/A)  Patient Location: PACU  Anesthesia Type:General  Level of Consciousness: sedated and responds to stimulation  Airway & Oxygen Therapy: Patient Spontanous Breathing and Patient connected to face mask oxygen  Post-op Assessment: Report given to RN and Post -op Vital signs reviewed and stable  Post vital signs: Reviewed and stable  Last Vitals:  Vitals:   11/21/15 1402 11/21/15 1439  BP: (!) 180/84 123/86  Pulse: 75 75  Resp: 16 12  Temp:      Last Pain:  Vitals:   11/21/15 1330  TempSrc: Tympanic  PainSc: 5       Patients Stated Pain Goal: 0 ([id number])  Complications: No apparent anesthesia complications

## 2015-11-21 NOTE — Anesthesia Procedure Notes (Signed)
Procedure Name: LMA Insertion Performed by: Carmeline Kowal Pre-anesthesia Checklist: Patient identified, Patient being monitored, Timeout performed, Emergency Drugs available and Suction available Patient Re-evaluated:Patient Re-evaluated prior to inductionOxygen Delivery Method: Circle system utilized Preoxygenation: Pre-oxygenation with 100% oxygen Intubation Type: IV induction LMA: LMA inserted LMA Size: 4.0 Tube type: Oral Number of attempts: 1 Placement Confirmation: positive ETCO2 and breath sounds checked- equal and bilateral Tube secured with: Tape Dental Injury: Teeth and Oropharynx as per pre-operative assessment        

## 2015-11-21 NOTE — Anesthesia Postprocedure Evaluation (Signed)
Anesthesia Post Note  Patient: John Booker  Procedure(s) Performed: Procedure(s) (LRB): IRRIGATION AND DEBRIDEMENT PERIRECTAL ABSCESS WITH DRESSING CHANGE (N/A)  Patient location during evaluation: PACU Anesthesia Type: General Level of consciousness: awake and alert Pain management: pain level controlled Vital Signs Assessment: post-procedure vital signs reviewed and stable Respiratory status: spontaneous breathing and respiratory function stable Cardiovascular status: stable Anesthetic complications: no    Last Vitals:  Vitals:   11/21/15 1402 11/21/15 1439  BP: (!) 180/84 123/86  Pulse: 75 75  Resp: 16 12  Temp:  37.3 C    Last Pain:  Vitals:   11/21/15 1439  TempSrc:   PainSc: Asleep                 KEPHART,WILLIAM K

## 2015-11-22 ENCOUNTER — Encounter: Payer: Self-pay | Admitting: Surgery

## 2015-11-22 MED ORDER — LOPERAMIDE HCL 2 MG PO CAPS
2.0000 mg | ORAL_CAPSULE | ORAL | Status: DC | PRN
  Administered 2015-11-22 – 2015-11-23 (×3): 2 mg via ORAL
  Filled 2015-11-22 (×3): qty 1

## 2015-11-22 NOTE — Progress Notes (Signed)
Pharmacy Antibiotic Note  John Booker is a 65 y.o. male admitted on 11/19/2015 with wound infection.  Pharmacy has been consulted for vancomycin dosing.  Plan: Patient was started on vancomycin 1000 mg IV q12h, which results in an estimated trough of ~ 9 mcg/mL which would be subtherapeutic.   Will increase dose to vancomycin 1500 mg IV q12h and check trough prior to the 5th dose on new regimen Goal vancomycin trough 15-20 mcg/mL Vancomycin trough scheduled for 9/14 @ 1930 which is prior to 5th dose and should represent steady state  9/14:  Lab unable to get blood sample from pt, pt is "hard stick".  VT scheduled for 9/14 @ 19:30 was not drawn.  Will reschedule VT for 9/15 @ 0730.   Kinetics: Using actual body weight of 97.5 kg Ke: 0.087 Half-life: ~8 hours Vd: 68 L  Cmin (est) ~ 15 mcg/mL  Height: 6\' 3"  (190.5 cm) Weight: 215 lb (97.5 kg) IBW/kg (Calculated) : 84.5  Temp (24hrs), Avg:97.9 F (36.6 C), Min:97.5 F (36.4 C), Max:98.1 F (36.7 C)   Recent Labs Lab 11/19/15 0826 11/20/15 0509  WBC 8.7 7.2  CREATININE 0.92 0.83    Estimated Creatinine Clearance: 104.6 mL/min (by C-G formula based on SCr of 0.83 mg/dL).    No Known Allergies  Antimicrobials this admission: vancomycin 9/11 >>  Piperacillin/tazobactam 9/11 >>   Dose adjustments this admission: 9/12 - Dose increased from vancomycin 1000 mg IV q12h to 1500 mg IV q12h  Microbiology results:  Thank you for allowing pharmacy to be a part of this patient's care.  Orene Desanctis, PharmD,  Clinical Pharmacist 11/22/2015 9:07 PM

## 2015-11-22 NOTE — Progress Notes (Signed)
Clinical Education officer, museum (CSW) presented bed offers to patient and his daughter. CSW explained the difference between home health and SNF. Per patient he is leaning towards SNF and asked CSW to come back tomorrow. CSW will follow up with patient's decision tomorrow. CSW will continue to follow and assist as needed.   [employer], LCSW [phone]

## 2015-11-22 NOTE — Progress Notes (Signed)
MD aware of more than three episodes of loose stool this am. No new order received.

## 2015-11-22 NOTE — NC FL2 (Signed)
[place] LEVEL OF CARE SCREENING TOOL     IDENTIFICATION  Patient Name: John Booker Birthdate: 1951/02/06 Sex: male Admission Date (Current Location): 11/19/2015  [facility] and [place] Number:  Engineering geologist and Address:  [facility], [address] [place], [place] [address]      Provider Number: [id number]  Attending Physician Name and Address:  Florene Glen, MD  Relative Name and Phone Number:       Current Level of Care: Hospital Recommended Level of Care: [place] Prior Approval Number:    Date Approved/Denied:   PASRR Number:  ([id number])  Discharge Plan: SNF    Current Diagnoses: Patient Active Problem List   Diagnosis Date Noted  . Peri-rectal abscess 11/19/2015  . Perineal abscess 11/15/2015  . HTN (hypertension) 11/15/2015  . Vitamin D deficiency 11/15/2015    Orientation RESPIRATION BLADDER Height & Weight     Self, Time, Situation, Place  Normal Continent Weight: 215 lb (97.5 kg) Height:  6\' 3"  (190.5 cm)  BEHAVIORAL SYMPTOMS/MOOD NEUROLOGICAL BOWEL NUTRITION STATUS   (none)  (none) Continent Diet (Regular Diet )  AMBULATORY STATUS COMMUNICATION OF NEEDS Skin   Extensive Assist Verbally Other (Comment), PU Stage and Appropriate Care (Patient needs at least twice a day normal saline wet to dry to groin wound)                       Personal Care Assistance Level of Assistance  Bathing, Feeding, Dressing Bathing Assistance: Limited assistance Feeding assistance: Independent Dressing Assistance: Limited assistance     Functional Limitations Info  Sight, Hearing, Speech Sight Info: Adequate Hearing Info: Adequate Speech Info: Adequate    SPECIAL CARE FACTORS FREQUENCY  PT (By licensed PT), OT (By licensed OT)     PT Frequency:  (5) OT Frequency:  (5)            Contractures      Additional Factors Info  Code Status, Allergies Code Status Info:  (Full Code.  ) Allergies Info:  (No Known Allergies. )           Current Medications (11/22/2015):  This is the current hospital active medication list Current Facility-Administered Medications  Medication Dose Route Frequency Provider Last Rate Last Dose  . acetaminophen (TYLENOL) tablet 650 mg  650 mg Oral Q6H Hubbard Robinson, MD   650 mg at 11/22/15 0819  . heparin injection 5,000 Units  5,000 Units Subcutaneous Q8H Florene Glen, MD   5,000 Units at 11/22/15 0631  . HYDROmorphone (DILAUDID) injection 1 mg  1 mg Intravenous Q3H PRN Hubbard Robinson, MD   1 mg at 11/21/15 2017  . ketorolac (TORADOL) 30 MG/ML injection 30 mg  30 mg Intravenous Q6H Hubbard Robinson, MD   30 mg at 11/22/15 0631  . oxyCODONE (Oxy IR/ROXICODONE) immediate release tablet 5-10 mg  5-10 mg Oral Q3H PRN Hubbard Robinson, MD      . piperacillin-tazobactam (ZOSYN) IVPB 3.375 g  3.375 g Intravenous Q8H Florene Glen, MD   3.375 g at 11/22/15 0631  . vancomycin (VANCOCIN) 1,500 mg in sodium chloride 0.9 % 500 mL IVPB  1,500 mg Intravenous Q12H Lenis Noon, RPH   1,500 mg at 11/22/15 1027     Discharge Medications: Please see discharge summary for a list of discharge medications.  Relevant Imaging Results:  Relevant Lab Results:   Additional Information  (SSN: [id number])  Gwynne Kemnitz,  Veronia Beets, LCSW

## 2015-11-22 NOTE — Clinical Social Work Note (Signed)
Clinical Social Work Assessment  Patient Details  Name: John Booker MRN: [id number] Date of Birth: 12/07/1950  Date of referral:  11/22/15               Reason for consult:  Facility Placement                Permission sought to share information with:  Chartered certified accountant granted to share information::  Yes, Verbal Permission Granted  Name::      [place]::   [place]   Relationship::     Contact Information:     Housing/Transportation Living arrangements for the past 2 months:  [employer] of Information:  Patient Patient Interpreter Needed:  None Criminal Activity/Legal Involvement Pertinent to Current Situation/Hospitalization:  No - Comment as needed Significant Relationships:  Adult Children Lives with:  Self Do you feel safe going back to the place where you live?  Yes Need for family participation in patient care:  Yes (Comment)  Care giving concerns:  Patient lives alone in [place].    Social Worker assessment / plan:  Holiday representative (CSW) received consult from MD for wound care needs. Per MD patient will need a dressing change twice daily. CSW also requested PT consult. CSW met with patient to discuss D/C plan. Patient was alert and oriented and was sitting up in the bed. CSW introduced self and explained role of CSW department. Per patient he lives alone and has 2 daughters. Per patient his daughters could come in the morning and after 5 every day if needed. CSW explained SNF option. Patient reported that he is not excited about the SNF option but will consider it. Patient is agreeable to SNF search in [facility].   FL2 complete and faxed out. CSW will continue to follow and assist as needed.    Employment status:  Retired Nurse, adult PT Recommendations:  Not assessed at this time Information / Referral to community resources:  [place]  Patient/Family's Response to care: Patient is considering SNF.  Patient/Family's Understanding of and Emotional Response to Diagnosis, Current Treatment, and Prognosis:  Patient was pleasant and thanked CSW for visit.   Emotional Assessment Appearance:  Appears stated age Attitude/Demeanor/Rapport:    Affect (typically observed):  Accepting, Adaptable, Pleasant Orientation:  Oriented to Self, Oriented to Place, Oriented to  Time, Oriented to Situation Alcohol / Substance use:  Not Applicable Psych involvement (Current and /or in the community):  No (Comment)  Discharge Needs  Concerns to be addressed:  Discharge Planning Concerns Readmission within the last 30 days:  No Current discharge risk:  Dependent with Mobility Barriers to Discharge:  Continued Medical Work up   [employer], Veronia Beets, LCSW 11/22/2015, 11:39 AM

## 2015-11-22 NOTE — Progress Notes (Signed)
Patient feels better today no problems with pain.  Afebrile  Wound is clean with granulation tissue purulence only on the dressing and some of that is Silvadene.  Stress with patient and with nursing the plan for wet-to-dry dressings every 8 hours starting this morning. An Rose remains in place.

## 2015-11-22 NOTE — Progress Notes (Signed)
SNF and Non-Emergent EMS Transport Benefits:  Number called: [phone] Rep: Terrence Dupont Reference Number: [id number]  [employer] Medicare Complete Choice PPO Plan One active as of 11/09/15 with no deductible.  Out of pocket max is $4500, of which $0 met so far.  In-network SNF: $0 copay for days 1-20, a $160 daily copay for days 21-49, and a $0 copay for days 50-100.  Once out of pocket is reached, patient covered at 100% for remainder of 100 day benefit period.  $0 copay for professional fees and 3 day hospital stay is not required.  Josem Kaufmann is required: [phone].    Non-emergent EMS transport: $225 copay for each one way medically necessary, Medicare covered trip.  Josem Kaufmann is not required for PPO plans for service codes [phone] or [phone].

## 2015-11-22 NOTE — Care Management (Signed)
Patient will require BID dressing changes at discharge.  CSW is following and initiating bed search.  PT consult pending.  RNCM available if needed for discharge planning.

## 2015-11-22 NOTE — Progress Notes (Signed)
Pt has had 3 occurrences of diarrhea throughout the shift in which he had to be cleaned and the dressing to his perineum redressed.

## 2015-11-22 NOTE — Evaluation (Signed)
Physical Therapy Evaluation Patient Details Name: John ZIENTEK MRN: [id number] DOB: Jan 08, 1951 Today's Date: 11/22/2015   History of Present Illness  Pt. presented to ED with 10hx. perineal pain worsening despite antibiotic treatment and draining. Admitted to perform I&D x3. currently recieving dressing changes BID with drain in place, no mobility precautions at this time  Clinical Impression  Pt. Alert and oriented, supine in bed upon arrival. Pt. Demonstrates gross B UE and B LE 5/5 strength symmetrically. Pt. Performed modified independent bed mobility when transitioning from supine to sit (HOB elevated) and demonstrated stable sitting balance at EOB with feet on floor. Pt. Was offered RW for gait but he declined, pt. able to ambulate without AD CGA approx. 282ft. Demonstrating a slow cadence, wide BOS, and externally rotated LE's B, possibly due to location and presence of wound dressing and drain, no LOB or buckling. Able to perform >74ft. Functional reach while standing at bedside adjusting bed linens, stable with SLS B while performing high knee marching without UE support. Able to perform Sit to stand transfers from chair and sit to supine transfer into bed (HOB flattened) with supervision. Pt. Does not demonstrate need for further skilled PT at this time.     Follow Up Recommendations No PT follow up    Equipment Recommendations       Recommendations for Other Services       Precautions / Restrictions Precautions Precautions: Fall Restrictions Weight Bearing Restrictions: No      Mobility  Bed Mobility Overal bed mobility: Modified Independent             General bed mobility comments: HOB elevated, use of bedrails   Transfers Overall transfer level: Modified independent   Transfers: Sit to/from Stand Sit to Stand: Modified independent (Device/Increase time)         General transfer comment: Pt. able to push off from seated surface and stand without diffculty,  supervision provided for safety.   Ambulation/Gait Ambulation/Gait assistance: Modified independent (Device/Increase time) Ambulation Distance (Feet): 240 Feet Assistive device:  (Pt. was offered RW but he declined ) Gait Pattern/deviations: WFL(Within Functional Limits)     General Gait Details: PT. demonstrates decreased cadence and B externally rotated LE's with wide BOS, may be associated with location and presence of wound dressing and drain.   Stairs            Wheelchair Mobility    Modified Rankin (Stroke Patients Only)       Balance Overall balance assessment: Needs assistance Sitting-balance support: Feet supported Sitting balance-Leahy Scale: Normal     Standing balance support: No upper extremity supported;During functional activity Standing balance-Leahy Scale: Normal Standing balance comment: Pt. able to perform reaching and bending tasks (adjusting bed linens) while standing without UE support. Also demonstrates stabilty with SLS B during high marching task without B UE support                 Standardized Balance Assessment Standardized Balance Assessment :  (functional reach >67ft. while pt. adjusted linens on bed without UE support B. )           Pertinent Vitals/Pain Pain Assessment: No/denies pain    Home Living Family/patient expects to be discharged to:: Private residence Living Arrangements: Alone   Type of Home: House Home Access: Stairs to enter Entrance Stairs-Rails: Right;Left (Cannot reach both at the same time) Entrance Stairs-Number of Steps: 5 Home Layout: One level Home Equipment: None      Prior Function Level  of Independence: Independent               Hand Dominance        Extremity/Trunk Assessment   Upper Extremity Assessment: Overall WFL for tasks assessed (B UE grossly 5/5 symmetrical )           Lower Extremity Assessment: Overall WFL for tasks assessed (B LE strength grossly 5/5 symmetrical)          Communication   Communication: No difficulties  Cognition Arousal/Alertness: Awake/alert Behavior During Therapy: WFL for tasks assessed/performed Overall Cognitive Status: Within Functional Limits for tasks assessed                      General Comments      Exercises Other Exercises Other Exercises: High knee marching in standing B x10, no UE support to simulate stair stepping.       Assessment/Plan    PT Assessment Patent does not need any further PT services  PT Diagnosis Acute pain   PT Problem List    PT Treatment Interventions     PT Goals (Current goals can be found in the Care Plan section) Acute Rehab PT Goals Patient Stated Goal: Pt. would like to continue independence of mobility  PT Goal Formulation: With patient Time For Goal Achievement: 12/06/15 Potential to Achieve Goals: Good    Frequency     Barriers to discharge        Co-evaluation               End of Session Equipment Utilized During Treatment: Gait belt Activity Tolerance: Patient tolerated treatment well Patient left: in bed;with call bell/phone within reach;with bed alarm set Nurse Communication: Mobility status         Time: [id number] PT Time Calculation (min) (ACUTE ONLY): 18 min   Charges:         PT G Codes:        Melanie Crazier, SPT  11/22/15,4:18 PM

## 2015-11-22 NOTE — Care Management Important Message (Signed)
Important Message  Patient Details  Name: John Booker MRN: [id number] Date of Birth: 1950/12/05   Medicare Important Message Given:  Yes    Beverly Sessions, RN 11/22/2015, 11:17 AM

## 2015-11-22 NOTE — Clinical Social Work Placement (Signed)
   CLINICAL SOCIAL WORK PLACEMENT  NOTE  Date:  11/22/2015  Patient Details  Name: John Booker MRN: [id number] Date of Birth: May 02, 1950  Clinical Social Work is seeking post-discharge placement for this patient at the [place] level of care (*CSW will initial, date and re-position this form in  chart as items are completed):  Yes   Patient/family provided with [place] Work Department's list of facilities offering this level of care within the geographic area requested by the patient (or if unable, by the patient's family).  Yes   Patient/family informed of their freedom to choose among providers that offer the needed level of care, that participate in Medicare, Medicaid or managed care program needed by the patient, have an available bed and are willing to accept the patient.  Yes   Patient/family informed of [place]'s ownership interest in [facility] and [facility], as well as of the fact that they are under no obligation to receive care at these facilities.  PASRR submitted to EDS on 11/22/15     PASRR number received on 11/22/15     Existing PASRR number confirmed on       FL2 transmitted to all facilities in geographic area requested by pt/family on 11/22/15     FL2 transmitted to all facilities within larger geographic area on       Patient informed that his/her managed care company has contracts with or will negotiate with certain facilities, including the following:            Patient/family informed of bed offers received.  Patient chooses bed at       Physician recommends and patient chooses bed at      Patient to be transferred to   on  .  Patient to be transferred to facility by       Patient family notified on   of transfer.  Name of family member notified:        PHYSICIAN       Additional Comment:    _______________________________________________ Breland Elders, Veronia Beets, LCSW 11/22/2015, 11:44 AM

## 2015-11-23 DIAGNOSIS — L03315 Cellulitis of perineum: Secondary | ICD-10-CM | POA: Diagnosis not present

## 2015-11-23 DIAGNOSIS — K611 Rectal abscess: Secondary | ICD-10-CM | POA: Diagnosis not present

## 2015-11-23 DIAGNOSIS — Z48817 Encounter for surgical aftercare following surgery on the skin and subcutaneous tissue: Secondary | ICD-10-CM | POA: Diagnosis not present

## 2015-11-23 DIAGNOSIS — K605 Anorectal fistula: Secondary | ICD-10-CM | POA: Diagnosis not present

## 2015-11-23 DIAGNOSIS — K613 Ischiorectal abscess: Secondary | ICD-10-CM | POA: Diagnosis not present

## 2015-11-23 DIAGNOSIS — Z87891 Personal history of nicotine dependence: Secondary | ICD-10-CM | POA: Diagnosis not present

## 2015-11-23 DIAGNOSIS — Z4803 Encounter for change or removal of drains: Secondary | ICD-10-CM | POA: Diagnosis not present

## 2015-11-23 LAB — AEROBIC CULTURE  (SUPERFICIAL SPECIMEN)

## 2015-11-23 LAB — AEROBIC CULTURE W GRAM STAIN (SUPERFICIAL SPECIMEN)

## 2015-11-23 MED ORDER — LOPERAMIDE HCL 2 MG PO CAPS: 2.0000 mg | ORAL_CAPSULE | ORAL | 0 refills | Status: DC | PRN

## 2015-11-23 MED ORDER — HYDROCODONE-ACETAMINOPHEN 5-325 MG PO TABS: 1.0000 | ORAL_TABLET | ORAL | 0 refills | Status: DC | PRN

## 2015-11-23 MED ORDER — CEPHALEXIN 500 MG PO CAPS: 500.0000 mg | ORAL_CAPSULE | Freq: Four times a day (QID) | ORAL | 1 refills | Status: DC

## 2015-11-23 MED ORDER — CEPHALEXIN 500 MG PO CAPS
500.0000 mg | ORAL_CAPSULE | Freq: Four times a day (QID) | ORAL | Status: DC
  Administered 2015-11-23: 500 mg via ORAL
  Filled 2015-11-23: qty 1

## 2015-11-23 NOTE — Clinical Social Work Placement (Signed)
   CLINICAL SOCIAL WORK PLACEMENT  NOTE  Date:  11/23/2015  Patient Details  Name: John Booker MRN: [id number] Date of Birth: [birth year]  Clinical Social Work is seeking post-discharge placement for this patient at the [place] level of care (*CSW will initial, date and re-position this form in  chart as items are completed):  Yes   Patient/family provided with [place] Work Department's list of facilities offering this level of care within the geographic area requested by the patient (or if unable, by the patient's family).  Yes   Patient/family informed of their freedom to choose among providers that offer the needed level of care, that participate in Medicare, Medicaid or managed care program needed by the patient, have an available bed and are willing to accept the patient.  Yes   Patient/family informed of [place]'s ownership interest in [facility] and [facility], as well as of the fact that they are under no obligation to receive care at these facilities.  PASRR submitted to EDS on 11/22/15     PASRR number received on 11/22/15     Existing PASRR number confirmed on       FL2 transmitted to all facilities in geographic area requested by pt/family on 11/22/15     FL2 transmitted to all facilities within larger geographic area on       Patient informed that his/her managed care company has contracts with or will negotiate with certain facilities, including the following:        Yes   Patient/family informed of bed offers received.  Patient chooses bed at  [facility] )     Physician recommends and patient chooses bed at      Patient to be transferred to  [facility] ) on 11/23/15.  Patient to be transferred to facility by  (Patient's family will transport him in private vehicle today. )     Patient family notified on 11/23/15 of transfer.  Name of family member notified:   (Per patient he has made his family  aware of D/C today. )     PHYSICIAN Please sign FL2     Additional Comment:    _______________________________________________ Yoan Sallade, Veronia Beets, LCSW 11/23/2015, 9:32 AM

## 2015-11-23 NOTE — Discharge Summary (Signed)
Physician Discharge Summary  Patient ID: John Booker MRN: [id number] DOB/AGE: 08-01-1950 65 y.o.  Admit date: 11/19/2015 Discharge date: 11/23/2015   Discharge Diagnoses:  Active Problems:   Peri-rectal abscess   Procedures:Incision and drainage and debridement of perineal wound, multiple examinations under anesthesia with dressing changes under anesthesia  Hospital Course: This a patient with a perineal wound following incision and drainage and debridement of a complex abscess involving the side of the scrotum and perianal area a Penrose drain is currently in place he also has an open wound that is being dressed every 8 hours with normal saline wet to dry dressings. His culture results have grown Escherichia coli only which is sensitive to cephalexin he is currently on Keflex orally. He will be seen in the office next week for Penrose drain removal. He is to be transferred to [facility] today for continued wound care. Follow-up in our office midweek for Penrose drain removal and wound evaluation. Patient may shower.  Consults: None  Disposition: 01-Home or Self Care     Medication List    STOP taking these medications   clindamycin 150 MG capsule Commonly known as:  CLEOCIN     TAKE these medications   cephALEXin 500 MG capsule Commonly known as:  KEFLEX Take 1 capsule (500 mg total) by mouth every 6 (six) hours.   HYDROcodone-acetaminophen 5-325 MG tablet Commonly known as:  NORCO/VICODIN Take 1-2 tablets by mouth every 4 (four) hours as needed for moderate pain.   loperamide 2 MG capsule Commonly known as:  IMODIUM Take 1 capsule (2 mg total) by mouth as needed for diarrhea or loose stools (may administer every after loose stool up to 16 mg max).      Follow-up Information    Phoebe Perch, MD Follow up in 5 day(s).   Specialty:  Surgery Contact information: [address] [place] [address] [phone]           Florene Glen, MD,  FACS

## 2015-11-23 NOTE — Progress Notes (Signed)
Clinical Social Worker (CSW) met with patient to get SNF choice. Patient chose [facility]. Patient is medically stable for D/C to [facility] today. Per [facility] admissions coordinator at [facility] patient will go to room 502. RN will call report to 500 hall. Patient's family will transport him today around 12 noon. CSW sent D/C orders to [facility] via [facility]. Patient is aware of above. Please reconsult if future social work needs arise. CSW signing off.   [employer], LCSW [phone]

## 2015-11-23 NOTE — Progress Notes (Signed)
Patient feels well his diarrhea is improved. His wound is being dressed as ordered.  Patient is afebrile.  Wound is clean with granulation tissue no purulence Penrose drain is in place.  Patient is being evaluated for [place] for wound care I will discontinue his IV antibiotics and start him on Keflex 4 times a day. This is in step with the culture findings of Escherichia coli sensitive to cefazolin.

## 2015-11-23 NOTE — Discharge Instructions (Signed)
Normal saline wet to dry dressings 3 times a day to perineal wound Penrose drain is in place and will be removed in the office next week Regular diet Stop Imodium once diarrhea has stopped Take all medications as prescribed May shower

## 2015-11-23 NOTE — Progress Notes (Signed)
Pt prepared for d/c to [facility]. IV d/c'd. Skin intact except as charted in most recent assessments. Wound dressing was changed prior to discharge. Vitals are stable. Report called to receiving facility. Pt to be transported by friend to [facility], packet was given to pt to give to receiving RN.   Angus Seller

## 2015-11-25 LAB — AEROBIC/ANAEROBIC CULTURE (SURGICAL/DEEP WOUND)

## 2015-11-25 LAB — AEROBIC/ANAEROBIC CULTURE W GRAM STAIN (SURGICAL/DEEP WOUND)

## 2015-11-27 ENCOUNTER — Other Ambulatory Visit: Payer: Self-pay

## 2015-11-27 DIAGNOSIS — K611 Rectal abscess: Secondary | ICD-10-CM | POA: Diagnosis not present

## 2015-11-27 DIAGNOSIS — L03315 Cellulitis of perineum: Secondary | ICD-10-CM | POA: Diagnosis not present

## 2015-11-28 ENCOUNTER — Ambulatory Visit (INDEPENDENT_AMBULATORY_CARE_PROVIDER_SITE_OTHER): Payer: Medicare Other | Admitting: Surgery

## 2015-11-28 ENCOUNTER — Encounter: Payer: Self-pay | Admitting: Surgery

## 2015-11-28 VITALS — BP 189/87 | HR 76 | Temp 98.2°F | Ht 72.0 in | Wt 215.0 lb

## 2015-11-28 DIAGNOSIS — K611 Rectal abscess: Secondary | ICD-10-CM | POA: Diagnosis not present

## 2015-11-28 NOTE — Patient Instructions (Signed)
Please call our office if you have questions or concerns. Please continue to apply damp dry dressing to scrotal area and dry dressing to the rest twice daily.  Please apply nystatin powder to area three times daily for 14 days.

## 2015-11-28 NOTE — Progress Notes (Signed)
65 year old male had I&D of a perirectal and perineal abscess last week requiring multiple debridements. Patient has since been at [facility] and patient states they have not been doing the dressing changes as was instructed. There is a small packing the scrotal wound twice a day however that has been doing dry dressings to the area. Patient states is also got a rash for the past 4 days in his groin and thigh area. Patient is still taking the Keflex. Patient states that his pain is better and that the drainage is improved and not as much on the dressings at this time. Patient denies any fevers or chills recently either.  Vitals:   11/28/15 1416  BP: (!) 189/87  Pulse: 76  Temp: 98.2 F (36.8 C)    PE:  Gen: NAD Perineal:  Scrotal area posteriorly opened with 4cm cavity with some granulation tissue but mainly fibrinous exudate, the area was not packed on taking down dressings, penrose drain in place with minimal drainage, removed today, candidal rash to perineum and thighs   A/P:  Patient has no signs of acute infection today however his wound has fibrinous activity and is not being packs to as it should be. The patient needs wet-to-dry dressings to this area of the scrotum packed twice daily and to the other area of the perineum where the [place] drain was need to dry dressings there twice daily. The patient also needs to continue the nystatin powder to clear up this area of the candidal rash. These instructions were sent to [facility] and the patient understands the dressing changes himself however he cannot see them to do them himself. The patient is frustrated with having to be a [facility] and feels as if he could get his daughter to do the dressing changes he may be able to go home. I instructed him that if he were to get his daughter to be able to do these dressing changes that we would be happy to teach her the dressing changes in clinic. I will have him follow-up 1 week for  a wound check.

## 2015-11-29 ENCOUNTER — Telehealth: Payer: Self-pay | Admitting: Surgery

## 2015-11-29 MED ORDER — CEPHALEXIN 500 MG PO CAPS: 500.0000 mg | ORAL_CAPSULE | Freq: Two times a day (BID) | ORAL | 0 refills | Status: DC

## 2015-11-29 MED ORDER — NYSTATIN 100000 UNIT/GM EX POWD: Freq: Four times a day (QID) | CUTANEOUS | 0 refills | Status: DC

## 2015-11-29 NOTE — Telephone Encounter (Signed)
mandi patient daughter wants to know if patient is discharged today what needs to happen with the antibiotic? She also has a quick question regarding the dressing.

## 2015-11-29 NOTE — Telephone Encounter (Signed)
Spoke with patient at this time and he stated he is being discharged today. However he will need his antibiotic and Nystatin powder. He stated the nurses there at the facility have not applied the Nystatin powder ever.   I spoke with patients daughter and provided dressing changing instructions damp to dry on scrotal area and dry on the rest twice daily. She stated she is comfortable with doing this and if she has any questions she will call our office.   Daughter and patient notified of medicines will be sent to pharmacy at this time.

## 2015-11-30 ENCOUNTER — Telehealth: Payer: Self-pay | Admitting: Surgery

## 2015-11-30 NOTE — Telephone Encounter (Signed)
Mandi, patients daughter, called and wanted to know when he can start back to work. She takes her lunch from 1 -2 but will be there until 5:00.

## 2015-12-04 ENCOUNTER — Ambulatory Visit (INDEPENDENT_AMBULATORY_CARE_PROVIDER_SITE_OTHER): Payer: Medicare Other | Admitting: General Surgery

## 2015-12-04 ENCOUNTER — Encounter: Payer: Self-pay | Admitting: General Surgery

## 2015-12-04 VITALS — BP 200/119 | HR 83 | Temp 98.1°F | Ht 73.0 in | Wt 221.0 lb

## 2015-12-04 DIAGNOSIS — Z4889 Encounter for other specified surgical aftercare: Secondary | ICD-10-CM

## 2015-12-04 NOTE — Progress Notes (Signed)
Outpatient Surgical Follow Up  12/04/2015  John Booker is an 65 y.o. male.   Chief Complaint  Patient presents with  . Routine Post Op    Perirectal abscess-11/21/15-Dr.Cooper    HPI: 65 year old male returns to clinic for follow-up 2 weeks after multiple I&D use of a left groin abscess. Patient reports doing well and a strong desire to return to work. He has been doing 3 times daily dressing changes without difficulty. He is not requiring any pain medication. He denies any chest pain, shortness breath, fevers, chills, abdominal pain, diarrhea, constipation.  Past Medical History:  Diagnosis Date  . Hypertension     Past Surgical History:  Procedure Laterality Date  . INCISION AND DRAINAGE PERIRECTAL ABSCESS N/A 11/19/2015   Procedure: IRRIGATION AND DEBRIDEMENT PERIRECTAL ABSCESS and debridement of scrotal abscess;  Surgeon: Florene Glen, MD;  Location: [facility] ORS;  Service: General;  Laterality: N/A;  . INCISION AND DRAINAGE PERIRECTAL ABSCESS N/A 11/20/2015   Procedure: IRRIGATION AND DEBRIDEMENT PERIRECTAL ABSCESS / WITH DRESSING CHANGE;  Surgeon: Florene Glen, MD;  Location: [facility] ORS;  Service: General;  Laterality: N/A;  peri rectal site  . INCISION AND DRAINAGE PERIRECTAL ABSCESS N/A 11/21/2015   Procedure: IRRIGATION AND DEBRIDEMENT PERIRECTAL ABSCESS WITH DRESSING CHANGE;  Surgeon: Florene Glen, MD;  Location: [facility] ORS;  Service: General;  Laterality: N/A;  . MINOR HEMORRHOIDECTOMY  1989    Family History  Problem Relation Age of Onset  . Hypertension Father     Social History:  reports that he has quit smoking. His smoking use included Cigarettes. He has a 19.00 pack-year smoking history. He has never used smokeless tobacco. He reports that he does not drink alcohol or use drugs.  Allergies: No Known Allergies  Medications reviewed.    ROS Multipoint review of systems was completed, all pertinent positives and negatives are documented within the history  of present illness remainder are negative.   BP (!) 200/119   Pulse 83   Temp 98.1 F (36.7 C) (Oral)   Ht 6\' 1"  (1.854 m)   Wt 100.2 kg (221 lb)   BMI 29.16 kg/m   Physical Exam Gen.: No acute distress taxine chest: Clear to auscultation Heart: Regular rhythm Abdomen: Soft and nontender Skin: Left groin abscess region visualized. Healthy granulation tissue without any spreading erythema or evidence of purulence. Approximately 9 x 4 cm in dimension.    No results found for this or any previous visit (from the past 48 hour(s)). No results found.  Assessment/Plan:  1. Aftercare following surgery 65 year old male status post multiple operations for left groin abscess. Doing very well. Discussed continuing wound care with the patient and that it would be okay to decrease dressing changes to twice daily. Considering he is not requiring any pain medications and improving at this rate we will provide him with appropriate documentation to allow him to return to work. He is to watch the area closely and for any signs of infection or worsening symptoms he is to report to clinic immediately. Otherwise he can follow-up in 1-2 weeks for an additional wound check with either myself or Dr. Burt Knack.     [name], MD FACS General Surgeon  12/04/2015,9:42 AM

## 2015-12-04 NOTE — Patient Instructions (Addendum)
We have made you an appointment for 12/05/15 @ 8:20 am with Dr. Parks Ranger for your blood pressure. It may take 6-8 weeks for the area to heal.  Please see your follow up appointment listed below. Please call our office if you have questions or concerns.

## 2015-12-05 ENCOUNTER — Encounter: Payer: Self-pay | Admitting: Family Medicine

## 2015-12-05 ENCOUNTER — Ambulatory Visit (INDEPENDENT_AMBULATORY_CARE_PROVIDER_SITE_OTHER): Payer: Medicare Other | Admitting: Family Medicine

## 2015-12-05 VITALS — BP 160/84 | HR 78 | Temp 98.3°F | Resp 16 | Ht 73.0 in | Wt 218.0 lb

## 2015-12-05 DIAGNOSIS — I10 Essential (primary) hypertension: Secondary | ICD-10-CM

## 2015-12-05 DIAGNOSIS — R600 Localized edema: Secondary | ICD-10-CM

## 2015-12-05 DIAGNOSIS — E871 Hypo-osmolality and hyponatremia: Secondary | ICD-10-CM

## 2015-12-05 LAB — BASIC METABOLIC PANEL WITH GFR
BUN: 12 mg/dL (ref 7–25)
CALCIUM: 9.3 mg/dL (ref 8.6–10.3)
CO2: 29 mmol/L (ref 20–31)
Chloride: 101 mmol/L (ref 98–110)
Creat: 0.94 mg/dL (ref 0.70–1.25)
GFR, Est Non African American: 84 mL/min (ref >=60)
GLUCOSE: 101 mg/dL — AB (ref 65–99)
POTASSIUM: 4.9 mmol/L (ref 3.5–5.3)
Sodium: 137 mmol/L (ref 135–146)

## 2015-12-05 MED ORDER — BLOOD PRESSURE CUFF MISC: 1.0000 | Freq: Every day | 0 refills | Status: DC

## 2015-12-05 MED ORDER — LISINOPRIL 20 MG PO TABS: 20.0000 mg | ORAL_TABLET | Freq: Every day | ORAL | 11 refills | Status: DC

## 2015-12-05 NOTE — Patient Instructions (Signed)
Your goal blood pressure is 140/90 Work on low salt/sodium diet - goal <1.5gm (1,500mg ) per day. Eat a diet high in fruits/vegetables and whole grains.  Look into mediterranean and DASH diet. Goal activity is 158min/wk of moderate intensity exercise.  This can be split into 30 minute chunks.  If you are not at this level, you can start with smaller 10-15 min increments and slowly build up activity. Look at [facility] for more resources  Please seek immediate medical attention at ER or Urgent Care if you develop: Chest pain, pressure or tightness. Red swollen legs or pain or discomfort in the calf.  Shortness of breath accompanied by nausea or diaphoresis Visual changes Numbness or tingling on one side of the body Facial droop Altered mental status Or any concerning symptoms.

## 2015-12-05 NOTE — Assessment & Plan Note (Signed)
Start Lisinopril today. Avoid diuretics at this time 2/2 low sodium in the hospital. Check BP and add HCTZ to help control BP and aid in pedal edema. Reviewed DASH diet. Reviewed alarm symptoms. Pt instructed to obtain BP cuff to check at home. Recheck in 2 weeks.

## 2015-12-05 NOTE — Progress Notes (Addendum)
Subjective:    Patient ID: John Booker, male    DOB: October 09, 1950, 65 y.o.   MRN: [id number]  HPI: John Booker is a 65 y.o. male presenting on 12/05/2015 for Hypertension (after surgery onset 3-4 week has high B/P surgeon recommonded to be seen by PCP)   HPI  Pt presents for follow-up of hypertension. Has recently had surgery for a groin abscess. Since that time his blood pressure has been elevated. Was on blood pressure medications 15 years ago- HCTZ but his physician took him off that medication.  No chest pain. No shortness of breath. No visual changes.  Is having some pedal edema.  Noticed both feet- even swelling post surgically. No redness. No calf pain. Gets better with elevation.  Past Medical History:  Diagnosis Date  . Hypertension     No current outpatient prescriptions on file prior to visit.   No current facility-administered medications on file prior to visit.     Review of Systems  Constitutional: Negative for chills and fever.  HENT: Negative.   Respiratory: Negative for chest tightness, shortness of breath and wheezing.   Cardiovascular: Positive for leg swelling. Negative for chest pain and palpitations.  Gastrointestinal: Negative for abdominal pain, nausea and vomiting.  Endocrine: Negative.   Genitourinary: Negative for discharge, dysuria, penile pain, testicular pain and urgency.  Musculoskeletal: Negative for arthralgias, back pain and joint swelling.  Skin: Negative.   Neurological: Negative for dizziness, weakness, numbness and headaches.  Psychiatric/Behavioral: Negative for dysphoric mood and sleep disturbance.   Per HPI unless specifically indicated above     Objective:    BP (!) 160/84 (BP Location: Left Arm)   Pulse 78   Temp 98.3 F (36.8 C) (Oral)   Resp 16   Ht 6\' 1"  (1.854 m)   Wt 218 lb (98.9 kg)   BMI 28.76 kg/m   Wt Readings from Last 3 Encounters:  12/05/15 218 lb (98.9 kg)  12/04/15 221 lb (100.2 kg)  11/28/15 215 lb  (97.5 kg)    Physical Exam  Constitutional: He is oriented to person, place, and time. He appears well-developed and well-nourished. No distress.  HENT:  Head: Normocephalic and atraumatic.  Neck: Neck supple. No thyromegaly present.  Cardiovascular: Normal rate, regular rhythm and normal heart sounds.  Exam reveals no gallop and no friction rub.   No murmur heard. Pulmonary/Chest: Effort normal and breath sounds normal. He has no wheezes.  Abdominal: Soft. Bowel sounds are normal. He exhibits no distension. There is no tenderness. There is no rebound.  Musculoskeletal: Normal range of motion. He exhibits edema (+2 pitting edema up to bilateral mid calf). He exhibits no tenderness.  Negative homans sign. Bilateral even calf circumference. No pain with deep calf palpation.   Neurological: He is alert and oriented to person, place, and time. He has normal reflexes.  Skin: Skin is warm and dry. No rash noted. No erythema.  Psychiatric: He has a normal mood and affect. His behavior is normal. Thought content normal.   Results for orders placed or performed during the hospital encounter of 11/19/15  Wound or Superficial Culture  Result Value Ref Range   Specimen Description ABSCESS    Special Requests NONE    Gram Stain      FEW WBC PRESENT, PREDOMINANTLY PMN FEW GRAM POSITIVE COCCI IN PAIRS AND CHAINS RARE GRAM NEGATIVE RODS    Culture      ABUNDANT ESCHERICHIA COLI ABUNDANT VIRIDANS STREPTOCOCCUS CALL MICROBIOLOGY LAB IF SENSITIVITIES  ARE REQUIRED. Performed at [facility]    Report Status 11/23/2015 FINAL    Organism ID, Bacteria ESCHERICHIA COLI       Susceptibility   Escherichia coli - MIC*    AMPICILLIN 4 SENSITIVE Sensitive     CEFAZOLIN <=4 SENSITIVE Sensitive     CEFEPIME <=1 SENSITIVE Sensitive     CEFTAZIDIME <=1 SENSITIVE Sensitive     CEFTRIAXONE <=1 SENSITIVE Sensitive     CIPROFLOXACIN <=0.25 SENSITIVE Sensitive     GENTAMICIN <=1 SENSITIVE Sensitive      IMIPENEM <=0.25 SENSITIVE Sensitive     TRIMETH/SULFA >=320 RESISTANT Resistant     AMPICILLIN/SULBACTAM <=2 SENSITIVE Sensitive     PIP/TAZO <=4 SENSITIVE Sensitive     Extended ESBL NEGATIVE Sensitive     * ABUNDANT ESCHERICHIA COLI  Aerobic/Anaerobic Culture (surgical/deep wound)  Result Value Ref Range   Specimen Description ABSCESS    Special Requests NONE    Gram Stain      FEW WBC PRESENT, PREDOMINANTLY PMN MODERATE GRAM POSITIVE COCCI IN PAIRS AND CHAINS FEW GRAM POSITIVE COCCI IN CLUSTERS FEW GRAM NEGATIVE RODS    Culture      ABUNDANT ESCHERICHIA COLI SUSCEPTIBILITIES PERFORMED ON PREVIOUS CULTURE WITHIN THE LAST 5 DAYS. ABUNDANT VIRIDANS STREPTOCOCCUS CALL MICROBIOLOGY LAB IF SENSITIVITIES ARE REQUIRED. NO ANAEROBES ISOLATED Performed at [facility]    Report Status 11/25/2015 FINAL   CBC  Result Value Ref Range   WBC 8.7 3.8 - 10.6 K/uL   RBC 4.50 4.40 - 5.90 MIL/uL   Hemoglobin 15.4 13.0 - 18.0 g/dL   HCT 44.0 40.0 - 52.0 %   MCV 97.7 80.0 - 100.0 fL   MCH 34.3 (H) 26.0 - 34.0 pg   MCHC 35.1 32.0 - 36.0 g/dL   RDW 14.2 11.5 - 14.5 %   Platelets 293 150 - 440 K/uL  Basic metabolic panel  Result Value Ref Range   Sodium 132 (L) 135 - 145 mmol/L   Potassium 3.3 (L) 3.5 - 5.1 mmol/L   Chloride 93 (L) 101 - 111 mmol/L   CO2 28 22 - 32 mmol/L   Glucose, Bld 149 (H) 65 - 99 mg/dL   BUN 14 6 - 20 mg/dL   Creatinine, Ser 0.92 0.61 - 1.24 mg/dL   Calcium 8.9 8.9 - 10.3 mg/dL   GFR calc non Af Amer >60 >60 mL/min   GFR calc Af Amer >60 >60 mL/min   Anion gap 11 5 - 15  Basic metabolic panel  Result Value Ref Range   Sodium 132 (L) 135 - 145 mmol/L   Potassium 4.1 3.5 - 5.1 mmol/L   Chloride 95 (L) 101 - 111 mmol/L   CO2 31 22 - 32 mmol/L   Glucose, Bld 208 (H) 65 - 99 mg/dL   BUN 11 6 - 20 mg/dL   Creatinine, Ser 0.83 0.61 - 1.24 mg/dL   Calcium 8.3 (L) 8.9 - 10.3 mg/dL   GFR calc non Af Amer >60 >60 mL/min   GFR calc Af Amer >60 >60 mL/min    Anion gap 6 5 - 15  CBC  Result Value Ref Range   WBC 7.2 3.8 - 10.6 K/uL   RBC 3.81 (L) 4.40 - 5.90 MIL/uL   Hemoglobin 13.1 13.0 - 18.0 g/dL   HCT 37.6 (L) 40.0 - 52.0 %   MCV 98.5 80.0 - 100.0 fL   MCH 34.3 (H) 26.0 - 34.0 pg   MCHC 34.8 32.0 - 36.0 g/dL  RDW 14.2 11.5 - 14.5 %   Platelets 264 150 - 440 K/uL  Surgical pathology  Result Value Ref Range   SURGICAL PATHOLOGY      Surgical Pathology CASE: [id number] PATIENT: [facility] Surgical Pathology Report     SPECIMEN SUBMITTED: A. Scrotal and perineal abscess tissue  CLINICAL HISTORY: None provided  PRE-OPERATIVE DIAGNOSIS: Perirectal and scrotal abscess  POST-OPERATIVE DIAGNOSIS: None provided.     DIAGNOSIS: A. SKIN AND SOFT TISSUE, SCROTUM AND PERINEUM; DEBRIDEMENT: - ACUTE ABSCESS AND NECROSIS.   GROSS DESCRIPTION: A. Labeled: Scrotal and perineal abscess tissue  Tissue fragment(s): Multiple  Size: 7.8 x 5.3 x 2 cm  Description: Aggregate of markedly softened and partially liquefied tan-brown skin soft tissue and blood clot  Representative sections submitted in one cassette(s).  Final Diagnosis performed by Bryan Lemma, MD.  Electronically signed 11/21/2015 10:20:56AM    The electronic signature indicates that the named Attending Pathologist has evaluated the specimen  Technical component performed at [facility], [address], [place], [place] [address] L ab: [phone] Dir: Darrick Penna. Evette Doffing, MD  Professional component performed at [facility], [facility], [place], [place], [place] [address] Lab: [phone] Dir: Dellia Nims. Reuel Derby, MD        Assessment & Plan:   Problem List Items Addressed This Visit      Cardiovascular and Mediastinum   HTN (hypertension) - Primary    Start Lisinopril today. Avoid diuretics at this time 2/2 low sodium in the hospital. Check BP and add HCTZ to help control BP and aid in pedal edema. Reviewed DASH diet. Reviewed alarm  symptoms. Pt instructed to obtain BP cuff to check at home. Recheck in 2 weeks.       Relevant Medications   lisinopril (PRINIVIL,ZESTRIL) 20 MG tablet   Blood Pressure Monitoring (BLOOD PRESSURE CUFF) MISC    Other Visit Diagnoses    Hyponatremia       Post-op as low was 129.  Will avoid diuretics until BMP returns. Consider adding HCTZ for better blood pressure control if sodium is normal.    Relevant Orders   BASIC METABOLIC PANEL WITH GFR   Pedal edema       Low index suspicion of blood clot- even calf circumference, no pain, no redness, was on lovenox post-op. Pt ambulatory. Likely dependent edema. Reviewed alarm symptoms- pt verbalized understanding. Will add diuretic is sodium and potassium are stable. Encouraged increased physical activity.       Meds ordered this encounter  Medications  . lisinopril (PRINIVIL,ZESTRIL) 20 MG tablet    Sig: Take 1 tablet (20 mg total) by mouth daily.    Dispense:  30 tablet    Refill:  11    Order Specific Question:   Supervising Provider    Answer:   Arlis Porta [phone]  . Blood Pressure Monitoring (BLOOD PRESSURE CUFF) MISC    Sig: 1 each by Does not apply route daily.    Dispense:  1 each    Refill:  0    Order Specific Question:   Supervising Provider    Answer:   Arlis Porta [id number]      Follow up plan: Return in about 2 weeks (around 12/19/2015), or if symptoms worsen or fail to improve, for HTN.

## 2015-12-06 NOTE — Telephone Encounter (Signed)
Patient came in for an appointment.

## 2015-12-11 ENCOUNTER — Ambulatory Visit (INDEPENDENT_AMBULATORY_CARE_PROVIDER_SITE_OTHER): Payer: Medicare Other | Admitting: Surgery

## 2015-12-11 ENCOUNTER — Encounter: Payer: Self-pay | Admitting: Surgery

## 2015-12-11 VITALS — BP 180/88 | HR 85 | Temp 99.0°F | Ht 73.0 in | Wt 218.0 lb

## 2015-12-11 DIAGNOSIS — K611 Rectal abscess: Secondary | ICD-10-CM

## 2015-12-11 NOTE — Patient Instructions (Addendum)
Please see your follow up appointment with Dr.Cooper listed below.   Continue your wet to dry dressing changes three times daily.  Call with any questions or concerns that you may have.

## 2015-12-11 NOTE — Progress Notes (Signed)
Outpatient postop visit  12/11/2015  John Booker is an 65 y.o. male.    Procedure: Debridement of necrotizing process of the perineum   CC: Open wound  HPI: This patient status post debridement of a necrotizing infection of the perineum involving the scrotum and left side of the perineum. He feels well at this point no fevers or chills he is concerned about how long is taking for the wound to close.  Medications reviewed.    Physical Exam:  BP (!) 180/88   Pulse 85   Temp 99 F (37.2 C) (Oral)   Ht 6\' 1"  (1.854 m)   Wt 218 lb (98.9 kg)   BMI 28.76 kg/m     PE: Granulation tissue present no erythema healing wound and buttock wound is healing as well nearly closed.    Assessment/Plan:  Discussed with the patient the life-threatening nature of his previous condition which is completely resolved his open wound will continue to heal and I will see him back in 2 weeks it may take several weeks for this to close completely and I could use silver nitrate at some point in the near future. 2 new normal saline wet to dry dressings  Florene Glen, MD, FACS

## 2015-12-19 ENCOUNTER — Ambulatory Visit (INDEPENDENT_AMBULATORY_CARE_PROVIDER_SITE_OTHER): Payer: Medicare Other | Admitting: Family Medicine

## 2015-12-19 ENCOUNTER — Encounter: Payer: Self-pay | Admitting: Family Medicine

## 2015-12-19 VITALS — BP 153/88 | HR 88 | Temp 98.2°F | Resp 16 | Ht 73.0 in | Wt 217.0 lb

## 2015-12-19 DIAGNOSIS — I10 Essential (primary) hypertension: Secondary | ICD-10-CM | POA: Diagnosis not present

## 2015-12-19 LAB — BASIC METABOLIC PANEL WITH GFR
BUN: 12 mg/dL (ref 7–25)
CO2: 27 mmol/L (ref 20–31)
Calcium: 10.3 mg/dL (ref 8.6–10.3)
Chloride: 97 mmol/L — ABNORMAL LOW (ref 98–110)
Creat: 1.05 mg/dL (ref 0.70–1.25)
GFR, EST AFRICAN AMERICAN: 85 mL/min (ref >=60)
GFR, EST NON AFRICAN AMERICAN: 74 mL/min (ref >=60)
Glucose, Bld: 138 mg/dL — ABNORMAL HIGH (ref 65–99)
POTASSIUM: 5.3 mmol/L (ref 3.5–5.3)
Sodium: 134 mmol/L — ABNORMAL LOW (ref 135–146)

## 2015-12-19 MED ORDER — HYDROCHLOROTHIAZIDE 25 MG PO TABS: 25.0000 mg | ORAL_TABLET | Freq: Every day | ORAL | 3 refills | Status: DC

## 2015-12-19 NOTE — Progress Notes (Signed)
Subjective:    Patient ID: John Booker, male    DOB: 11-Oct-1950, 65 y.o.   MRN: [id number]  John Booker is a 65 y.o. male presenting on 12/19/2015 for Hypertension   HPI   CHRONIC HTN: Reports prior diagnosis of HTN >15 years ago, had been treated with HCTZ temporarily and then this was discontinued after BP improved. No other known major problems with BP since. - Recent problem with significantly elevated BP when presented with perineal abscess and cellulitis back on 9.7/17, he has been hospitalized and s/p several surgeries, overall recovering, but still had remarkably high BP during that course. Next f/u with Dr Burt Knack [facility] on 12/26/15. - Last seen at [facility] 12/05/15, checked labs with normal electrolytes, started on Lisinopril 20mg  daily, he started taking on 10/4 - Brings in written record of home BP measurements, wrist electronic cuff - ranging avg of 160-180 / 69-80s, high of 190/90, checks AM and PM Reports good compliance, took meds today. Tolerating well, w/o complaints. Lifestyle - Limited regular exercise, currently not doing any given post-op recovery Drinks gatorade and water Admits some edema in feet recently, this has improved Denies CP, dyspnea, HA, edema, dizziness / lightheadedness   Social History  Substance Use Topics  . Smoking status: Former Smoker    Packs/day: 0.50    Years: 38.00    Types: Cigarettes  . Smokeless tobacco: Never Used  . Alcohol use No    Review of Systems Per HPI unless specifically indicated above     Objective:    BP (!) 153/88 (BP Location: Left Arm, Patient Position: Sitting, Cuff Size: Large)   Pulse 88   Temp 98.2 F (36.8 C) (Oral)   Resp 16   Ht 6\' 1"  (1.854 m)   Wt 217 lb (98.4 kg)   BMI 28.63 kg/m   Wt Readings from Last 3 Encounters:  12/19/15 217 lb (98.4 kg)  12/11/15 218 lb (98.9 kg)  12/05/15 218 lb (98.9 kg)    Physical Exam  Constitutional: He appears well-developed and well-nourished. No  distress.  Well-appearing, comfortable, cooperative  Cardiovascular: Normal rate, regular rhythm, normal heart sounds and intact distal pulses.   No murmur heard. Pulmonary/Chest: Effort normal and breath sounds normal. No respiratory distress. He has no wheezes. He has no rales.  Musculoskeletal: He exhibits no edema.  Neurological: He is alert.  Skin: Skin is warm and dry. He is not diaphoretic.  Nursing note and vitals reviewed.    I have personally reviewed below labs from 12/05/15  Results for orders placed or performed in visit on [id number]  BASIC METABOLIC PANEL WITH GFR  Result Value Ref Range   Sodium 137 135 - 146 mmol/L   Potassium 4.9 3.5 - 5.3 mmol/L   Chloride 101 98 - 110 mmol/L   CO2 29 20 - 31 mmol/L   Glucose, Bld 101 (H) 65 - 99 mg/dL   BUN 12 7 - 25 mg/dL   Creat 0.94 0.70 - 1.25 mg/dL   Calcium 9.3 8.6 - 10.3 mg/dL   GFR, Est African American >89 >=60 mL/min   GFR, Est Non African American 84 >=60 mL/min      Assessment & Plan:   Problem List Items Addressed This Visit    HTN (hypertension) - Primary    Mild elevated BP today >150/80 in office, however elevated home BP checks SBP 160-180s. Recently started Lisinopril 20mg  on 10/4 - Reviewed last chemistry 9/27 normal electrolytes  Plan: 1. Continue  Lisinopril 20mg  daily 2. Add HCTZ 25mg  daily for additional BP control, may help limit lower ext edema as well 3. Check BMET today, follow-up K after 1 week on ACEi 4. Continue check home BP measurements, bring to next visit review 5. Recommend daily ASA 81mg  daily for reduce risk CVA/MI given persistent elevated BP, discuss again in future with physical for lipids and calculate ASCVD risk 6. Follow-up 4 weeks, may consider future switch to combo pill Lisinopril-HCTZ      Relevant Medications   hydrochlorothiazide (HYDRODIURIL) 25 MG tablet   Other Relevant Orders   BASIC METABOLIC PANEL WITH GFR    Other Visit Diagnoses   None.     Meds ordered this  encounter  Medications  . hydrochlorothiazide (HYDRODIURIL) 25 MG tablet    Sig: Take 1 tablet (25 mg total) by mouth daily.    Dispense:  30 tablet    Refill:  3      Follow up plan: Return in about 4 weeks (around 01/16/2016) for blood pressure.  [name], [facility] 12/19/2015, 12:42 PM

## 2015-12-19 NOTE — Assessment & Plan Note (Addendum)
Mild elevated BP today >150/80 in office, however elevated home BP checks SBP 160-180s. Recently started Lisinopril 20mg  on 10/4 - Reviewed last chemistry 9/27 normal electrolytes  Plan: 1. Continue Lisinopril 20mg  daily 2. Add HCTZ 25mg  daily for additional BP control, may help limit lower ext edema as well 3. Check BMET today, follow-up K after 1 week on ACEi 4. Continue check home BP measurements, bring to next visit review 5. Recommend daily ASA 81mg  daily for reduce risk CVA/MI given persistent elevated BP, discuss again in future with physical for lipids and calculate ASCVD risk 6. Follow-up 4 weeks, may consider future switch to combo pill Lisinopril-HCTZ

## 2015-12-19 NOTE — Patient Instructions (Signed)
Thank you for coming in to clinic today.  1. Your Blood Pressure is improved. However, looking over all of your readings and recent levels, it is still a little too high. Our goal is going to be < 150 / 90 consistently, ideally < 140 / 90. - Added new BP pill today, Hydrochlorothiazide 25mg  daily - take this and the Lisinopril 20mg  daily at same time every day - It has a diuretic fluid effect and may reduce some swelling as well - We will re-check lab today with chemistry to confirm that sodium and potassium are normal - Stay well hydrated with water and gatorade is fine to drink too - Recommend to start daily baby Aspirin 81mg  (generic over the counter) to reduce risk of heart attack or stroke due to high blood pressure  Office will call you with lab results within 2-3 days  Please schedule a follow-up appointment with Dr. Parks Ranger in 4 weeks for BP Re-check  If you have any other questions or concerns, please feel free to call the clinic or send a message through [place]. You may also schedule an earlier appointment if necessary.  [name], DO [place]

## 2015-12-20 ENCOUNTER — Encounter: Payer: Self-pay | Admitting: Family Medicine

## 2015-12-20 DIAGNOSIS — R7309 Other abnormal glucose: Secondary | ICD-10-CM | POA: Insufficient documentation

## 2015-12-25 ENCOUNTER — Telehealth: Payer: Self-pay | Admitting: Surgery

## 2015-12-25 ENCOUNTER — Ambulatory Visit (INDEPENDENT_AMBULATORY_CARE_PROVIDER_SITE_OTHER): Payer: Medicare Other | Admitting: Surgery

## 2015-12-25 VITALS — Wt 217.0 lb

## 2015-12-25 DIAGNOSIS — K611 Rectal abscess: Secondary | ICD-10-CM

## 2015-12-25 NOTE — Patient Instructions (Signed)
Please give [place] a call if you have any questions or concerns. We will see you back in 10 days.

## 2015-12-25 NOTE — Progress Notes (Signed)
Patient seen after debridement of necrotizing process of the perineum. He has no complaints at this time is frustrated with his open wound however.  Wound is granulating nicely and has decreased in size considerably but is still quite large. The wound around the buttock area is completely closed and the one in the left perineum is open but granulating well. No erythema no drainage  Silver nitrate is applied  Patient doing very well I reminded him of the large size of his wound initially and that it is improved considerably. I suspect silver nitrate will assist in contraction here and I will see him back next week for follow-up

## 2015-12-25 NOTE — Telephone Encounter (Signed)
Patient needs a letter stating when Dr. Burt Knack started his care

## 2015-12-26 NOTE — Telephone Encounter (Signed)
Pt's daughter called and stated that [phone], is the best number to use to get in contact with him. Number updated in patient's chart as well.

## 2015-12-26 NOTE — Telephone Encounter (Signed)
Returned phone call to find out exactly what letter will need to say for patient. Home number listed is daughter's work number. Left a message for patient's daughter to return my phone call with a correct number.

## 2015-12-26 NOTE — Telephone Encounter (Signed)
Spoke with patient at this time. He would like the letter that he was given at last office visit faxed to [phone] with patient's attention.  This was faxed with positive confirmation at this time.

## 2016-01-02 ENCOUNTER — Encounter: Payer: Self-pay | Admitting: Surgery

## 2016-01-02 ENCOUNTER — Ambulatory Visit (INDEPENDENT_AMBULATORY_CARE_PROVIDER_SITE_OTHER): Payer: Medicare Other | Admitting: Surgery

## 2016-01-02 ENCOUNTER — Ambulatory Visit: Payer: Medicare Other | Admitting: Surgery

## 2016-01-02 VITALS — BP 186/95 | HR 94 | Temp 98.4°F | Wt 219.0 lb

## 2016-01-02 DIAGNOSIS — K611 Rectal abscess: Secondary | ICD-10-CM

## 2016-01-02 NOTE — Patient Instructions (Signed)
Please give [place] a call if you have any questions or concerns.  Please continue to change your dressing twice a day until you don't have any drainage.

## 2016-01-02 NOTE — Progress Notes (Signed)
Patient is for follow-up today. He has no complaints he thinks the wound is healing well.  Granulation tissue is present it is nearly closed it is approximately 9 mm across. No purulence.  Silver nitrate is applied  Patient doing very well will see him in 2 weeks.

## 2016-01-16 ENCOUNTER — Encounter: Payer: Self-pay | Admitting: Surgery

## 2016-01-17 ENCOUNTER — Encounter (INDEPENDENT_AMBULATORY_CARE_PROVIDER_SITE_OTHER): Payer: Self-pay

## 2016-01-17 ENCOUNTER — Encounter: Payer: Self-pay | Admitting: Surgery

## 2016-01-17 ENCOUNTER — Ambulatory Visit (INDEPENDENT_AMBULATORY_CARE_PROVIDER_SITE_OTHER): Payer: Medicare Other | Admitting: Surgery

## 2016-01-17 VITALS — BP 167/85 | HR 93 | Temp 98.4°F | Ht 75.0 in | Wt 221.0 lb

## 2016-01-17 DIAGNOSIS — K611 Rectal abscess: Secondary | ICD-10-CM

## 2016-01-17 NOTE — Patient Instructions (Signed)

## 2016-01-17 NOTE — Progress Notes (Signed)
Wound is essentially healed. He has no problems at this time.  Approximately 4 mm x 1 mm treated with silver nitrate but it for the most part the wound is completely healed. Recommend follow up on an as-needed basis.

## 2016-01-18 ENCOUNTER — Encounter: Payer: Self-pay | Admitting: Family Medicine

## 2016-01-18 ENCOUNTER — Ambulatory Visit (INDEPENDENT_AMBULATORY_CARE_PROVIDER_SITE_OTHER): Payer: Medicare Other | Admitting: Family Medicine

## 2016-01-18 VITALS — BP 158/80 | HR 88 | Temp 97.8°F | Resp 16 | Ht 75.0 in | Wt 221.0 lb

## 2016-01-18 DIAGNOSIS — R7309 Other abnormal glucose: Secondary | ICD-10-CM | POA: Diagnosis not present

## 2016-01-18 DIAGNOSIS — I10 Essential (primary) hypertension: Secondary | ICD-10-CM | POA: Diagnosis not present

## 2016-01-18 MED ORDER — AMLODIPINE BESYLATE 10 MG PO TABS: ORAL_TABLET | ORAL | 1 refills | Status: DC

## 2016-01-18 NOTE — Progress Notes (Signed)
Subjective:    Patient ID: John Booker, male    DOB: 02-22-1951, 65 y.o.   MRN: [id number]  REYAANSH Booker is a 65 y.o. male presenting on 01/18/2016 for Hypertension (ranges from 170/90)   HPI   CHRONIC HTN: - Last visit with me at [facility] 12/19/15 for BP, added HCTZ 25mg  to previous Lisinopril 20mg , checked chemistry with borderline low Na 134 and K 5.3, still elevated glucose. Additionally his perineal abscess followed by General Surgery has mostly resolved, they are no longer following it. He is in no pain. - Today he brings home BP readings, with avg home BP 160/80, with high 179/91, down to 156/77 - Reports good med compliance, took meds today. Tolerating well, w/o complaints. 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 No known family history of DM. Recent several abnormal glucose readings since prior hospitalization, however at that time with acute significant infection with perineal abscess. Recent fasting lab with glucose 138, prior non fasting has been >200, which is indicated of Diabetes. He does not have A1c lab value but is planning to return for labs   Social History  Substance Use Topics  . Smoking status: Former Smoker    Packs/day: 0.50    Years: 38.00    Types: Cigarettes  . Smokeless tobacco: Never Used  . Alcohol use No    Review of Systems Per HPI unless specifically indicated above     Objective:    BP (!) 158/80 (BP Location: Left Arm, Cuff Size: Normal)   Pulse 88   Temp 97.8 F (36.6  C) (Oral)   Resp 16   Ht 6\' 3"  (1.905 m)   Wt 221 lb (100.2 kg)   BMI 27.62 kg/m   Wt Readings from Last 3 Encounters:  01/18/16 221 lb (100.2 kg)  01/17/16 221 lb (100.2 kg)  01/02/16 219 lb (99.3 kg)    Physical Exam  Constitutional: He appears well-developed and well-nourished. No distress.  Well-appearing, comfortable, cooperative  Eyes: Conjunctivae are normal.  Neck: Normal range of motion. Neck supple. No thyromegaly present.  No carotid bruit  Cardiovascular: Normal rate, regular rhythm, normal heart sounds and intact distal pulses.   No murmur heard. Pulmonary/Chest: Effort normal and breath sounds normal. No respiratory distress. He has no wheezes. He has no rales.  Musculoskeletal: He exhibits no edema.  Lymphadenopathy:    He has no cervical adenopathy.  Neurological: He is alert.  Skin: Skin is warm and dry. He is not diaphoretic.  Nursing note and vitals reviewed.    I have personally reviewed below labs from 12/05/15  Results for orders placed or performed in visit on [id number]  BASIC METABOLIC PANEL WITH GFR  Result Value Ref Range   Sodium 134 (L) 135 - 146 mmol/L   Potassium 5.3 3.5 - 5.3 mmol/L   Chloride 97 (L) 98 - 110 mmol/L   CO2 27 20 - 31 mmol/L   Glucose, Bld 138 (H) 65 - 99 mg/dL   BUN 12 7 -  25 mg/dL   Creat 1.05 0.70 - 1.25 mg/dL   Calcium 10.3 8.6 - 10.3 mg/dL   GFR, Est African American 85 >=60 mL/min   GFR, Est Non African American 74 >=60 mL/min      Assessment & Plan:   Problem List Items Addressed This Visit    HTN (hypertension) - Primary    Poorly controlled elevated initial, mild improvement on manual re-check, still >150/80. Home readings >160/80-90. - Med regimen seems to only help marginally, overall still improved from prior to starting therapy. Concern for resistant HTN. No other significant other factors to HTN, except no regular exercise  Plan: 1. Continue Lisinopril 20, HCTZ 25 2. Add Amlodipine 10mg  - start cut in  half for 5mg  1-2 weeks, if no pedal edema then can try inc to 10mg . Patient hesitant to use this with prior edema on Nifedipine, however willing to try. If develops edema early on, advised to DC, contact me sooner and will consider adding BB 3. Continue ASA 81 4. Check future chemistry, fasting lipid, A1c, TSH 5. Follow-up future lab only 4 weeks, BP re-check with me 6 weeks      Relevant Medications   amLODipine (NORVASC) 10 MG tablet   Other Relevant Orders   BASIC METABOLIC PANEL WITH GFR   Lipid panel   TSH   Abnormal glucose    Suspect patient may be undiagnosed diabetic with prior random glucose >200, however with acute perineal abscess infection, many other glucose still >100, with last fasting 138  Plan: 1. Future A1c, lipids 2. Follow-up to review results 4-6 weeks      Relevant Orders   Hemoglobin A1c      Meds ordered this encounter  Medications  . amLODipine (NORVASC) 10 MG tablet    Sig: Start half tab 5mg  daily, if tolerated after 1-2 weeks increase to whole tab 10mg  daily    Dispense:  30 tablet    Refill:  1      Follow up plan: Return in about 4 weeks (around 02/15/2016) for labs, BP.  John Putnam, DO [facility] 01/19/2016, 10:37 AM

## 2016-01-18 NOTE — Patient Instructions (Signed)
Thank you for coming in to clinic today.  1. Start new BP pill Amlodipine half tablet 5mg  daily for 1-2 weeks, if you do not get swelling, then you can increase to whole pill 10mg  daily - Keep checking BP every day bring to next visit - Continue same other medications - If you get swelling or BP is NOT controlled, still persistently above >160/90 then please CALL and leave me a message to discuss this in 2-3 weeks before you come in to office  Start regular exercise with walking up to 30 minutes at a time, brisk walk, several days a week. To help control Bp and blood sugar  The 5 Minute Rule of Exercise - Promise yourself to at least do 5 minutes of exercise (make sure you time it), and if at the end of 5 minutes (this is the hardest part of the work-out), if you still feel like you want stop (or not motivated to continue) then allow yourself to stop. Otherwise, more often than not you will feel encouraged that you can continue for a little while longer or even more!   Diet Recommendations for Low Carb / Diabetes   Starchy (carb) foods include: Bread, rice, pasta, potatoes, corn, crackers, bagels, muffins, all baked goods.   Protein foods include: Meat, fish, poultry, eggs, dairy foods, and beans such as pinto and kidney beans (beans also provide carbohydrate).   1. Eat at least 3 meals and 1-2 snacks per day. Never go more than 4-5 hours while awake without eating.   2. Limit starchy foods to TWO per meal and ONE per snack. ONE portion of a starchy  food is equal to the following:   - ONE slice of bread (or its equivalent, such as half of a hamburger bun).   - 1/2 cup of a "scoopable" starchy food such as potatoes or rice.   - 1 OUNCE (28 grams) of starchy snacks (crackers or pretzels, look on label).   - 15 grams of carbohydrate as shown on food label.   3. Both lunch and dinner should include a protein food, a carb food, and vegetables.   - Obtain twice as many veg's as protein or  carbohydrate foods for both lunch and dinner.   - Try to keep frozen veg's on hand for a quick vegetable serving.     - Fresh or frozen veg's are best.   4. Breakfast should always include protein.    You will be due for FASTING BLOOD WORK (no food or drink after midnight before, only water or coffee without cream/sugar on the morning of)  - Please go ahead and schedule a "Lab Only" visit in the morning at the clinic for lab draw in 1 MONTHS  - Make sure Lab Only appointment is at least 1-2 weeks before your next appointment, so that results will be available  For Lab Results, once available within 2-3 days of blood draw, you can can log in to MyChart online to view your results and a brief explanation. Also, we can discuss results at next follow-up visit.   Please schedule a follow-up appointment with Dr. Parks Ranger in 6 weeks for BP, lab review blood sugar  If you have any other questions or concerns, please feel free to call the clinic or send a message through [facility]. You may also schedule an earlier appointment if necessary.  John Putnam, DO [facility]

## 2016-01-19 NOTE — Assessment & Plan Note (Signed)
Poorly controlled elevated initial, mild improvement on manual re-check, still >150/80. Home readings >160/80-90. - Med regimen seems to only help marginally, overall still improved from prior to starting therapy. Concern for resistant HTN. No other significant other factors to HTN, except no regular exercise  Plan: 1. Continue Lisinopril 20, HCTZ 25 2. Add Amlodipine 10mg  - start cut in half for 5mg  1-2 weeks, if no pedal edema then can try inc to 10mg . Patient hesitant to use this with prior edema on Nifedipine, however willing to try. If develops edema early on, advised to DC, contact me sooner and will consider adding BB 3. Continue ASA 81 4. Check future chemistry, fasting lipid, A1c, TSH 5. Follow-up future lab only 4 weeks, BP re-check with me 6 weeks

## 2016-01-19 NOTE — Assessment & Plan Note (Signed)
Suspect patient may be undiagnosed diabetic with prior random glucose >200, however with acute perineal abscess infection, many other glucose still >100, with last fasting 138  Plan: 1. Future A1c, lipids 2. Follow-up to review results 4-6 weeks

## 2016-02-15 ENCOUNTER — Other Ambulatory Visit: Payer: Self-pay | Admitting: Family Medicine

## 2016-02-15 ENCOUNTER — Telehealth: Payer: Self-pay | Admitting: Family Medicine

## 2016-02-15 DIAGNOSIS — I10 Essential (primary) hypertension: Secondary | ICD-10-CM

## 2016-02-15 DIAGNOSIS — R7309 Other abnormal glucose: Secondary | ICD-10-CM

## 2016-02-15 NOTE — Telephone Encounter (Signed)
Please put [facility] lab order in for pt to go on Monday to do labs.

## 2016-02-15 NOTE — Telephone Encounter (Signed)
Labs ordered.[place]

## 2016-03-31 ENCOUNTER — Other Ambulatory Visit: Payer: Self-pay | Admitting: Family Medicine

## 2016-03-31 DIAGNOSIS — I10 Essential (primary) hypertension: Secondary | ICD-10-CM

## 2016-05-16 ENCOUNTER — Other Ambulatory Visit: Payer: Self-pay | Admitting: Family Medicine

## 2016-05-16 DIAGNOSIS — I10 Essential (primary) hypertension: Secondary | ICD-10-CM

## 2016-12-31 ENCOUNTER — Other Ambulatory Visit: Payer: Self-pay | Admitting: Family Medicine

## 2016-12-31 DIAGNOSIS — I10 Essential (primary) hypertension: Secondary | ICD-10-CM

## 2016-12-31 MED ORDER — LISINOPRIL 20 MG PO TABS: 20.0000 mg | ORAL_TABLET | Freq: Every day | ORAL | 0 refills | Status: DC

## 2017-02-18 ENCOUNTER — Other Ambulatory Visit: Payer: Self-pay

## 2017-02-18 DIAGNOSIS — I10 Essential (primary) hypertension: Secondary | ICD-10-CM

## 2017-02-18 MED ORDER — LISINOPRIL 20 MG PO TABS: 20.0000 mg | ORAL_TABLET | Freq: Every day | ORAL | 6 refills | Status: DC

## 2017-03-03 ENCOUNTER — Emergency Department: Payer: Medicare Other

## 2017-03-03 ENCOUNTER — Encounter: Payer: Self-pay | Admitting: Emergency Medicine

## 2017-03-03 ENCOUNTER — Emergency Department
Admission: EM | Admit: 2017-03-03 | Discharge: 2017-03-04 | Disposition: A | Discharge status: Home / Self Care | Payer: Medicare Other | Source: Home / Self Care | Attending: Student in an Organized Health Care Education/Training Program | Admitting: Student in an Organized Health Care Education/Training Program

## 2017-03-03 DIAGNOSIS — W19XXXA Unspecified fall, initial encounter: Secondary | ICD-10-CM

## 2017-03-03 DIAGNOSIS — R51 Headache: Secondary | ICD-10-CM | POA: Insufficient documentation

## 2017-03-03 DIAGNOSIS — Z87891 Personal history of nicotine dependence: Secondary | ICD-10-CM | POA: Insufficient documentation

## 2017-03-03 DIAGNOSIS — Y999 Unspecified external cause status: Secondary | ICD-10-CM

## 2017-03-03 DIAGNOSIS — Z79899 Other long term (current) drug therapy: Secondary | ICD-10-CM | POA: Insufficient documentation

## 2017-03-03 DIAGNOSIS — Y929 Unspecified place or not applicable: Secondary | ICD-10-CM | POA: Insufficient documentation

## 2017-03-03 DIAGNOSIS — R531 Weakness: Secondary | ICD-10-CM

## 2017-03-03 DIAGNOSIS — I10 Essential (primary) hypertension: Secondary | ICD-10-CM | POA: Insufficient documentation

## 2017-03-03 DIAGNOSIS — R45851 Suicidal ideations: Secondary | ICD-10-CM | POA: Diagnosis not present

## 2017-03-03 DIAGNOSIS — F10230 Alcohol dependence with withdrawal, uncomplicated: Secondary | ICD-10-CM | POA: Diagnosis not present

## 2017-03-03 DIAGNOSIS — Y939 Activity, unspecified: Secondary | ICD-10-CM | POA: Insufficient documentation

## 2017-03-03 DIAGNOSIS — F1029 Alcohol dependence with unspecified alcohol-induced disorder: Secondary | ICD-10-CM | POA: Insufficient documentation

## 2017-03-03 DIAGNOSIS — M6281 Muscle weakness (generalized): Secondary | ICD-10-CM

## 2017-03-03 DIAGNOSIS — Y908 Blood alcohol level of 240 mg/100 ml or more: Secondary | ICD-10-CM | POA: Insufficient documentation

## 2017-03-03 LAB — URINALYSIS, COMPLETE (UACMP) WITH MICROSCOPIC
BILIRUBIN URINE: NEGATIVE
Bacteria, UA: NONE SEEN
Glucose, UA: NEGATIVE mg/dL
KETONES UR: 20 mg/dL — AB
Leukocytes, UA: NEGATIVE
Nitrite: NEGATIVE
Protein, ur: 100 mg/dL — AB
SPECIFIC GRAVITY, URINE: 1.016 (ref 1.005–1.030)
SQUAMOUS EPITHELIAL / LPF: NONE SEEN
pH: 6 (ref 5.0–8.0)

## 2017-03-03 LAB — CBC WITH DIFFERENTIAL/PLATELET
BASOS ABS: 0.1 10*3/uL (ref 0–0.1)
BASOS PCT: 1 %
Eosinophils Absolute: 0 10*3/uL (ref 0–0.7)
Eosinophils Relative: 0 %
HEMATOCRIT: 47.3 % (ref 40.0–52.0)
HEMOGLOBIN: 16 g/dL (ref 13.0–18.0)
LYMPHS PCT: 29 %
Lymphs Abs: 1.3 10*3/uL (ref 1.0–3.6)
MCH: 33.7 pg (ref 26.0–34.0)
MCHC: 33.9 g/dL (ref 32.0–36.0)
MCV: 99.4 fL (ref 80.0–100.0)
MONO ABS: 0.2 10*3/uL (ref 0.2–1.0)
Monocytes Relative: 5 %
NEUTROS ABS: 3.1 10*3/uL (ref 1.4–6.5)
NEUTROS PCT: 65 %
Platelets: 172 10*3/uL (ref 150–440)
RBC: 4.76 MIL/uL (ref 4.40–5.90)
RDW: 14 % (ref 11.5–14.5)
WBC: 4.7 10*3/uL (ref 3.8–10.6)

## 2017-03-03 LAB — COMPREHENSIVE METABOLIC PANEL
ALBUMIN: 4.4 g/dL (ref 3.5–5.0)
ALT: 88 U/L — AB (ref 17–63)
AST: 184 U/L — AB (ref 15–41)
Alkaline Phosphatase: 128 U/L — ABNORMAL HIGH (ref 38–126)
Anion gap: 17 — ABNORMAL HIGH (ref 5–15)
BILIRUBIN TOTAL: 1.4 mg/dL — AB (ref 0.3–1.2)
BUN: 15 mg/dL (ref 6–20)
CO2: 27 mmol/L (ref 22–32)
CREATININE: 1.06 mg/dL (ref 0.61–1.24)
Calcium: 8.6 mg/dL — ABNORMAL LOW (ref 8.9–10.3)
Chloride: 95 mmol/L — ABNORMAL LOW (ref 101–111)
GFR calc Af Amer: >60 mL/min (ref >60)
GFR calc non Af Amer: >60 mL/min (ref >60)
GLUCOSE: 89 mg/dL (ref 65–99)
POTASSIUM: 3.2 mmol/L — AB (ref 3.5–5.1)
Sodium: 139 mmol/L (ref 135–145)
TOTAL PROTEIN: 7.6 g/dL (ref 6.5–8.1)

## 2017-03-03 LAB — ETHANOL: ALCOHOL ETHYL (B): 387 mg/dL — AB (ref <10)

## 2017-03-03 MED ORDER — LORAZEPAM 2 MG/ML IJ SOLN: 0.0000 mg | Freq: Four times a day (QID) | INTRAMUSCULAR | Status: DC

## 2017-03-03 MED ORDER — SODIUM CHLORIDE 0.9 % IV BOLUS (SEPSIS)
1000.0000 mL | Freq: Once | INTRAVENOUS | Status: AC
  Administered 2017-03-03: 1000 mL via INTRAVENOUS

## 2017-03-03 MED ORDER — GI COCKTAIL ~~LOC~~
30.0000 mL | Freq: Once | ORAL | Status: AC
  Administered 2017-03-03: 23:00:00 via ORAL

## 2017-03-03 MED ORDER — GI COCKTAIL ~~LOC~~
ORAL | Status: AC
  Filled 2017-03-03: qty 30

## 2017-03-03 MED ORDER — LORAZEPAM 2 MG PO TABS: 0.0000 mg | ORAL_TABLET | Freq: Two times a day (BID) | ORAL | Status: DC

## 2017-03-03 MED ORDER — LORAZEPAM 2 MG/ML IJ SOLN: 0.0000 mg | Freq: Two times a day (BID) | INTRAMUSCULAR | Status: DC

## 2017-03-03 MED ORDER — VITAMIN B-1 100 MG PO TABS
100.0000 mg | ORAL_TABLET | Freq: Once | ORAL | Status: AC
  Administered 2017-03-03: 100 mg via ORAL
  Filled 2017-03-03: qty 1

## 2017-03-03 MED ORDER — THIAMINE HCL 100 MG/ML IJ SOLN: 100.0000 mg | Freq: Every day | INTRAMUSCULAR | Status: DC

## 2017-03-03 MED ORDER — HYDROCHLOROTHIAZIDE 25 MG PO TABS: 25.0000 mg | ORAL_TABLET | Freq: Every day | ORAL | Status: DC

## 2017-03-03 MED ORDER — LORAZEPAM 2 MG PO TABS
0.0000 mg | ORAL_TABLET | Freq: Four times a day (QID) | ORAL | Status: DC
Start: 2017-03-03 — End: 2017-03-04

## 2017-03-03 MED ORDER — VITAMIN B-1 100 MG PO TABS: 100.0000 mg | ORAL_TABLET | Freq: Every day | ORAL | Status: DC

## 2017-03-03 MED ORDER — AMLODIPINE BESYLATE 5 MG PO TABS: 10.0000 mg | ORAL_TABLET | Freq: Every day | ORAL | Status: DC

## 2017-03-03 NOTE — ED Notes (Signed)
Daughter of patient at bedside. Daughter asks to speak with this RN and MD outside of room and expresses concerns over suicidal thoughts patient has expressed to family over the last two days.

## 2017-03-03 NOTE — ED Provider Notes (Signed)
[facility] Emergency Department Provider Note    First MD Initiated Contact with Patient 03/03/17 2017     (approximate)  I have reviewed the triage vital signs and the nursing notes.   HISTORY  Chief Complaint Weakness    HPI John Booker is a 66 y.o. male presents from home at recommendation of his son for evaluation of 2 weeks of weakness.  Patient does endorse decreased oral intake.  Does smell of alcohol and admits to drinking several times a week.  States that weakness somewhat not particularly became worse 2 weeks ago if the patient did fall and hit his head.  Denies any numbness or tingling.  He is not on any blood thinners.  Denies any abdominal pain.  No lower extremity swelling or shortness of breath.  No chest pain or palpitations.  Past Medical History:  Diagnosis Date  . Hypertension    Family History  Problem Relation Age of Onset  . Hypertension Father    Past Surgical History:  Procedure Laterality Date  . INCISION AND DRAINAGE PERIRECTAL ABSCESS N/A 11/19/2015   Procedure: IRRIGATION AND DEBRIDEMENT PERIRECTAL ABSCESS and debridement of scrotal abscess;  Surgeon: Florene Glen, MD;  Location: [facility] ORS;  Service: General;  Laterality: N/A;  . INCISION AND DRAINAGE PERIRECTAL ABSCESS N/A 11/20/2015   Procedure: IRRIGATION AND DEBRIDEMENT PERIRECTAL ABSCESS / WITH DRESSING CHANGE;  Surgeon: Florene Glen, MD;  Location: [facility] ORS;  Service: General;  Laterality: N/A;  peri rectal site  . INCISION AND DRAINAGE PERIRECTAL ABSCESS N/A 11/21/2015   Procedure: IRRIGATION AND DEBRIDEMENT PERIRECTAL ABSCESS WITH DRESSING CHANGE;  Surgeon: Florene Glen, MD;  Location: [facility] ORS;  Service: General;  Laterality: N/A;  . [place]   Patient Active Problem List   Diagnosis Date Noted  . Abnormal glucose 12/20/2015  . Peri-rectal abscess 11/19/2015  . Perineal abscess 11/15/2015  . HTN (hypertension) 11/15/2015  .  Vitamin D deficiency 11/15/2015      Prior to Admission medications   Medication Sig Start Date End Date Taking? Authorizing Provider  amLODipine (NORVASC) 10 MG tablet Take 1 tablet (10 mg total) by mouth daily. 03/31/16  Yes Karamalegos, Devonne Doughty, DO  hydrochlorothiazide (HYDRODIURIL) 25 MG tablet TAKE ONE TABLET BY MOUTH EVERY DAY 05/16/16  Yes Karamalegos, Devonne Doughty, DO  lisinopril (PRINIVIL,ZESTRIL) 20 MG tablet Take 1 tablet (20 mg total) by mouth daily. 02/18/17  Yes Karamalegos, Devonne Doughty, DO    Allergies Adalat [nifedipine]    Social History Social History   Tobacco Use  . Smoking status: Former Smoker    Packs/day: 0.50    Years: 38.00    Pack years: 19.00    Types: Cigarettes  . Smokeless tobacco: Never Used  Substance Use Topics  . Alcohol use: No  . Drug use: No    Review of Systems Patient denies headaches, rhinorrhea, blurry vision, numbness, shortness of breath, chest pain, edema, cough, abdominal pain, nausea, vomiting, diarrhea, dysuria, fevers, rashes or hallucinations unless otherwise stated above in HPI. ____________________________________________   PHYSICAL EXAM:  VITAL SIGNS: Vitals:   03/03/17 2020 03/03/17 2130  BP: (!) 186/108 (!) 176/99  Pulse: 87 87  Resp: 13 16  Temp: 99.3 F (37.4 C)   SpO2: 100%     Constitutional: Alert and oriented.  Smells of etoh but in no acute distress. Eyes: Conjunctivae are normal.  Head: Atraumatic. Nose: No congestion/rhinnorhea. Mouth/Throat: Mucous membranes are moist.   Neck: No stridor.  Painless ROM.  Cardiovascular: Normal rate, regular rhythm. Grossly normal heart sounds.  Good peripheral circulation. Respiratory: Normal respiratory effort.  No retractions. Lungs CTAB. Gastrointestinal: Soft and nontender. No distention. No abdominal bruits. No CVA tenderness. Musculoskeletal: No lower extremity tenderness nor edema.  No joint effusions. Neurologic:  Normal speech and language. No gross  focal neurologic deficits are appreciated. No facial droop Skin:  Skin is warm, dry and intact. No rash noted. Psychiatric: Mood and affect are normal. Speech and behavior are normal.  ____________________________________________   LABS (all labs ordered are listed, but only abnormal results are displayed)  Results for orders placed or performed during the hospital encounter of 03/03/17 (from the past 24 hour(s))  CBC with Differential/Platelet     Status: None   Collection Time: 03/03/17  8:18 PM  Result Value Ref Range   WBC 4.7 3.8 - 10.6 K/uL   RBC 4.76 4.40 - 5.90 MIL/uL   Hemoglobin 16.0 13.0 - 18.0 g/dL   HCT 47.3 40.0 - 52.0 %   MCV 99.4 80.0 - 100.0 fL   MCH 33.7 26.0 - 34.0 pg   MCHC 33.9 32.0 - 36.0 g/dL   RDW 14.0 11.5 - 14.5 %   Platelets 172 150 - 440 K/uL   Neutrophils Relative % 65 %   Neutro Abs 3.1 1.4 - 6.5 K/uL   Lymphocytes Relative 29 %   Lymphs Abs 1.3 1.0 - 3.6 K/uL   Monocytes Relative 5 %   Monocytes Absolute 0.2 0.2 - 1.0 K/uL   Eosinophils Relative 0 %   Eosinophils Absolute 0.0 0 - 0.7 K/uL   Basophils Relative 1 %   Basophils Absolute 0.1 0 - 0.1 K/uL  Comprehensive metabolic panel     Status: Abnormal   Collection Time: 03/03/17  8:18 PM  Result Value Ref Range   Sodium 139 135 - 145 mmol/L   Potassium 3.2 (L) 3.5 - 5.1 mmol/L   Chloride 95 (L) 101 - 111 mmol/L   CO2 27 22 - 32 mmol/L   Glucose, Bld 89 65 - 99 mg/dL   BUN 15 6 - 20 mg/dL   Creatinine, Ser 1.06 0.61 - 1.24 mg/dL   Calcium 8.6 (L) 8.9 - 10.3 mg/dL   Total Protein 7.6 6.5 - 8.1 g/dL   Albumin 4.4 3.5 - 5.0 g/dL   AST 184 (H) 15 - 41 U/L   ALT 88 (H) 17 - 63 U/L   Alkaline Phosphatase 128 (H) 38 - 126 U/L   Total Bilirubin 1.4 (H) 0.3 - 1.2 mg/dL   GFR calc non Af Amer >60 >60 mL/min   GFR calc Af Amer >60 >60 mL/min   Anion gap 17 (H) 5 - 15  Ethanol     Status: Abnormal   Collection Time: 03/03/17  8:18 PM  Result Value Ref Range   Alcohol, Ethyl (B) 387 (HH) <10  mg/dL  Urinalysis, Complete w Microscopic     Status: Abnormal   Collection Time: 03/03/17  8:19 PM  Result Value Ref Range   Color, Urine AMBER (A) YELLOW   APPearance CLEAR (A) CLEAR   Specific Gravity, Urine 1.016 1.005 - 1.030   pH 6.0 5.0 - 8.0   Glucose, UA NEGATIVE NEGATIVE mg/dL   Hgb urine dipstick SMALL (A) NEGATIVE   Bilirubin Urine NEGATIVE NEGATIVE   Ketones, ur 20 (A) NEGATIVE mg/dL   Protein, ur 100 (A) NEGATIVE mg/dL   Nitrite NEGATIVE NEGATIVE   Leukocytes, UA NEGATIVE NEGATIVE  RBC / HPF 0-5 0 - 5 RBC/hpf   WBC, UA 0-5 0 - 5 WBC/hpf   Bacteria, UA NONE SEEN NONE SEEN   Squamous Epithelial / LPF NONE SEEN NONE SEEN   Mucus PRESENT    Hyaline Casts, UA PRESENT    ____________________________________________  EKG My review and personal interpretation at Time: 20:24   Indication: weakness  Rate: 85  Rhythm: sinus Axis: left Other: normal intervals, no stemi, poor r wave progressions ____________________________________________  RADIOLOGY  I personally reviewed all radiographic images ordered to evaluate for the above acute complaints and reviewed radiology reports and findings.  These findings were personally discussed with the patient.  Please see medical record for radiology report.  ____________________________________________   PROCEDURES  Procedure(s) performed:  Procedures    Critical Care performed: no ____________________________________________   INITIAL IMPRESSION / ASSESSMENT AND PLAN / ED COURSE  Pertinent labs & imaging results that were available during my care of the patient were reviewed by me and considered in my medical decision making (see chart for details).  DDX: Dehydration, sepsis, pna, uti, hypoglycemia, cva, drug effect, withdrawal, encephalitis   TYJUAN DEMETRO is a 66 y.o. who presents to the ED from home due to chief complaint of 2 weeks of weakness and report of fall and head injury with persistent alcohol abuse.  We  will check blood work as well as CT and radiographs for the above differential.  Will provide IV fluids.  Clinical Course as of Mar 03 2326  Tue Mar 03, 2017  2157 Daughter now bedside and states that for the past several days the patient has been stating that he wants to kill himself.  Has been excessively drinking.  Lives at home by himself.  Family states he has been more depressed and reclusive.  Denies any history of suicidality.  No access to guns at home but does live at home alone.  Based on these reports, his significant alcohol abuse high risk for suicide do feel patient will require psychiatric evaluation.  We will also place consult to social work and physical therapy regarding weakness as the patient may require additional therapies given his deconditioning.  [PR]    Clinical Course User Index [PR] Merlyn Lot, MD     ____________________________________________   FINAL CLINICAL IMPRESSION(S) / ED DIAGNOSES  Final diagnoses:  Weakness  Alcohol dependence with unspecified alcohol-induced disorder ([place])  Suicidal ideation      NEW MEDICATIONS STARTED DURING THIS VISIT:  This SmartLink is deprecated. Use AVSMEDLIST instead to display the medication list for a patient.   Note:  This document was prepared using Dragon voice recognition software and may include unintentional dictation errors.     Merlyn Lot, MD 03/03/17 [phone]

## 2017-03-03 NOTE — ED Notes (Signed)
Patient is IVC/Pending psych consult.

## 2017-03-03 NOTE — ED Notes (Addendum)
Daughter of pt states pt stating "I just want to end it all..I don't want to do this anymore" x3days. Daughter reports pt heavy daily drinker and in the last year lost his mother, wife, and job. Sits in his apartment all day and depression continues to worsen.   Pt pleasant and cooperative at this time

## 2017-03-03 NOTE — ED Notes (Signed)
Pt was able to ambulate independently in room with minimal to no assistance.

## 2017-03-03 NOTE — ED Triage Notes (Signed)
Pt arrived via ems from home with complaints of weakness for "2-3 weeks.: Pt states there is no change in condition today but family insisted he seek medical advice. Upon arrival pt alert and oriented x 4.

## 2017-03-03 NOTE — BH Assessment (Signed)
Assessment Note  John Booker is an 66 y.o. male. John Booker arrived to the ED by way of personal transportation by his daughter for "weakness".  He denied symptoms of depression or anxiety.  He denied having auditory or visual hallucinations.  He denied suicidal ideation or intent.  He denied homicidal ideation or intent.  John Booker was irritable and not very cooperative with the assessor.  He stated multiple times that this is stupid and the questions were stupid. He denied the use of alcohol, except on occasion.  John Booker BAL is 58. John Booker did not appear to be a reliable resource of information.  TTS spoke with John Booker [phone]) - daughter of John Booker.  She reports father is drinking himself to death. "He is an alcoholic and he has been for some time. In the last couple of days he has mentioned killing himself 3 times.  He has a cough for a while which has not been looked at.  He only wants to drink and sleep".  She reports that he falls at night, and that is a concern to her. She reports that he shared that he would commit suicide with a pistol. She reports that the weapon is no longer in the home.  (Contrary to John Booker statement) She states that he does not leave his apartment and he does not clean.  She stated, "He will go to the grocery to get enough to eat, but he just drinks and sleeps".  Diagnosis: Substance Abuse, Substance induced disorder  Past Medical History:  Past Medical History:  Diagnosis Date  . Hypertension     Past Surgical History:  Procedure Laterality Date  . INCISION AND DRAINAGE PERIRECTAL ABSCESS N/A 11/19/2015   Procedure: IRRIGATION AND DEBRIDEMENT PERIRECTAL ABSCESS and debridement of scrotal abscess;  Surgeon: Florene Glen, MD;  Location: [facility] ORS;  Service: General;  Laterality: N/A;  . INCISION AND DRAINAGE PERIRECTAL ABSCESS N/A 11/20/2015   Procedure: IRRIGATION AND DEBRIDEMENT PERIRECTAL ABSCESS / WITH DRESSING CHANGE;   Surgeon: Florene Glen, MD;  Location: [facility] OR[facility];  Service: General;  Laterality: N/A;  peri rectal site  . INCISION AND DRAINAGE PERIRECTAL ABSCESS N/A 11/21/2015   Procedure: IRRIGATION AND DEBRIDEMENT PERIRECTAL ABSCESS WITH DRESSING CHANGE;  Surgeon: Florene Glen, MD;  Location: [facility] ORS;  Service: General;  Laterality: N/A;  . MINOR HEMORRHOIDECTOMY  1989    Family History:  Family History  Problem Relation Age of Onset  . Hypertension Father     Social History:  reports that he has quit smoking. His smoking use included cigarettes. He has a 19.00 pack-year smoking history. he has never used smokeless tobacco. He reports that he does not drink alcohol or use drugs.  Additional Social History:  Alcohol / Drug Use History of alcohol / drug use?: Yes(Patient reports that he "drinks alcohol once in a while, but not much")  CIWA: CIWA-Ar BP: (!) 176/99 Pulse Rate: 87 Nausea and Vomiting: mild nausea with no vomiting Tactile Disturbances: none Tremor: not visible, but can be felt fingertip to fingertip Auditory Disturbances: not present Paroxysmal Sweats: barely perceptible sweating, palms moist Visual Disturbances: not present Anxiety: no anxiety, at ease Headache, Fullness in Head: none present Agitation: normal activity Orientation and Clouding of Sensorium: oriented and can do serial additions CIWA-Ar Total: 3 COWS:    Allergies:  Allergies  Allergen Reactions  . Adalat [Nifedipine]     Foot swelling.     Home Medications:  (Not in  a hospital admission)  OB/GYN Status:  No LMP for male patient.  General Assessment Data TTS Assessment: In system Is this a Tele or Face-to-Face Assessment?: Face-to-Face Is this an Initial Assessment or a Re-assessment for this encounter?: Initial Assessment Marital status: Single Maiden name: n/a Is patient pregnant?: No Pregnancy Status: No Living Arrangements: Alone Can pt return to current living arrangement?:  Yes Admission Status: Voluntary Is patient capable of signing voluntary admission?: Yes Referral Source: Self/Family/Friend Insurance type: Medicare Secretary/administrator)  Medical Screening Exam ([place]) Medical Exam completed: Yes  Crisis Care Plan Living Arrangements: Alone Legal Guardian: Other:(Self) Name of Psychiatrist: None Name of Therapist: None  Education Status Is patient currently in school?: No Current Grade: n/a Highest grade of school patient has completed: 12th Name of school: [employer] person: n/a  Risk to self with the past 6 months Suicidal Ideation: No(Denied by patient) Has patient been a risk to self within the past 6 months prior to admission? : No Suicidal Intent: No Has patient had any suicidal intent within the past 6 months prior to admission? : No Is patient at risk for suicide?: No Suicidal Plan?: No Has patient had any suicidal plan within the past 6 months prior to admission? : No Access to Means: No What has been your use of drugs/alcohol within the last 12 months?: patient reports little alcohol use, daughter reports excessive alcohol use Previous Attempts/Gestures: No How many times?: 0 Other Self Harm Risks: denied Triggers for Past Attempts: (None reported) Intentional Self Injurious Behavior: None Family Suicide History: No Recent stressful life event(s): (denied) Persecutory voices/beliefs?: No Depression: No Depression Symptoms: (denied) Substance abuse history and/or treatment for substance abuse?: Yes Suicide prevention information given to non-admitted patients: Not applicable  Risk to Others within the past 6 months Homicidal Ideation: No Does patient have any lifetime risk of violence toward others beyond the six months prior to admission? : No Thoughts of Harm to Others: No Current Homicidal Intent: No Current Homicidal Plan: No Access to Homicidal Means: No Identified Victim: Denied History of  harm to others?: No Assessment of Violence: None Noted Violent Behavior Description: denied Does patient have access to weapons?: Yes (Comment)(several rifles and a couple of pistols) Criminal Charges Pending?: No Does patient have a court date: No Is patient on probation?: No  Psychosis Hallucinations: None noted Delusions: None noted  Mental Status Report Appearance/Hygiene: In scrubs Eye Contact: Fair Motor Activity: Agitation Speech: Logical/coherent, Aggressive Level of Consciousness: Alert Mood: Irritable Affect: Angry Anxiety Level: None Thought Processes: Coherent Judgement: Partial Orientation: Place, Time, Situation, Person Obsessive Compulsive Thoughts/Behaviors: None  Cognitive Functioning Concentration: Normal Memory: Recent Intact IQ: Average Insight: Poor Impulse Control: Poor Appetite: Good Sleep: No Change Vegetative Symptoms: None  ADLScreening [facility] Assessment Services) Patient's cognitive ability adequate to safely complete daily activities?: Yes Patient able to express need for assistance with ADLs?: Yes Independently performs ADLs?: Yes (appropriate for developmental age)  Prior Inpatient Therapy Prior Inpatient Therapy: No Prior Therapy Dates: n/a Prior Therapy Facilty/Provider(s): n/a Reason for Treatment: n/a  Prior Outpatient Therapy Prior Outpatient Therapy: No Prior Therapy Dates: n/a Prior Therapy Facilty/Provider(s): n/a Reason for Treatment: n/a Does patient have an ACCT team?: No Does patient have Intensive In-House Services?  : No Does patient have [facility] services? : No Does patient have P4CC services?: No  ADL Screening (condition at time of admission) Patient's cognitive ability adequate to safely complete daily activities?: Yes Is the patient deaf or have difficulty  hearing?: No Does the patient have difficulty seeing, even when wearing glasses/contacts?: No Does the patient have difficulty concentrating, remembering, or  making decisions?: No Patient able to express need for assistance with ADLs?: Yes Does the patient have difficulty dressing or bathing?: No Independently performs ADLs?: Yes (appropriate for developmental age) Does the patient have difficulty walking or climbing stairs?: No Weakness of Legs: Both Weakness of Arms/Hands: Both  Home Assistive Devices/Equipment Home Assistive Devices/Equipment: None          Advance Directives (For Healthcare) Does Patient Have a Medical Advance Directive?: No    Additional Information 1:1 In Past 12 Months?: No CIRT Risk: No Elopement Risk: No Does patient have medical clearance?: Yes     Disposition:  Disposition Initial Assessment Completed for this Encounter: Yes Disposition of Patient: Pending Review with psychiatrist  On Site Evaluation by:   Reviewed with Physician:    Elmer Bales 03/03/2017 10:50 PM

## 2017-03-03 NOTE — ED Notes (Signed)
Pt requesting meds for acid reflux. VORB from MD received for GI cocktail

## 2017-03-03 NOTE — ED Notes (Addendum)
Pt belongings include: pair of brown sperry shoes, pair of white socks, gray tee shirt, navy blue pull over sweatshirt, gray boxer shorts, blue jeans, cell phone, and keys. Contents placed into pt belonging bag and sent with daughter.

## 2017-03-03 NOTE — ED Notes (Signed)
TTS with pt and family at this time.

## 2017-03-04 ENCOUNTER — Encounter: Payer: Self-pay | Admitting: Emergency Medicine

## 2017-03-04 ENCOUNTER — Inpatient Hospital Stay
Admission: EM | Admit: 2017-03-04 | Discharge: 2017-03-10 | DRG: 897 | Disposition: A | Discharge status: Home / Self Care | Payer: Medicare Other | Attending: Internal Medicine | Admitting: Internal Medicine

## 2017-03-04 ENCOUNTER — Other Ambulatory Visit: Payer: Self-pay

## 2017-03-04 DIAGNOSIS — E861 Hypovolemia: Secondary | ICD-10-CM | POA: Diagnosis present

## 2017-03-04 DIAGNOSIS — Z23 Encounter for immunization: Secondary | ICD-10-CM

## 2017-03-04 DIAGNOSIS — A0472 Enterocolitis due to Clostridium difficile, not specified as recurrent: Secondary | ICD-10-CM | POA: Diagnosis present

## 2017-03-04 DIAGNOSIS — Z79899 Other long term (current) drug therapy: Secondary | ICD-10-CM

## 2017-03-04 DIAGNOSIS — I10 Essential (primary) hypertension: Secondary | ICD-10-CM | POA: Diagnosis present

## 2017-03-04 DIAGNOSIS — E871 Hypo-osmolality and hyponatremia: Secondary | ICD-10-CM | POA: Diagnosis present

## 2017-03-04 DIAGNOSIS — F10930 Alcohol use, unspecified with withdrawal, uncomplicated: Secondary | ICD-10-CM

## 2017-03-04 DIAGNOSIS — F10230 Alcohol dependence with withdrawal, uncomplicated: Secondary | ICD-10-CM

## 2017-03-04 DIAGNOSIS — F1010 Alcohol abuse, uncomplicated: Secondary | ICD-10-CM

## 2017-03-04 DIAGNOSIS — Z87891 Personal history of nicotine dependence: Secondary | ICD-10-CM

## 2017-03-04 DIAGNOSIS — R45851 Suicidal ideations: Secondary | ICD-10-CM | POA: Diagnosis present

## 2017-03-04 DIAGNOSIS — Y900 Blood alcohol level of less than 20 mg/100 ml: Secondary | ICD-10-CM | POA: Diagnosis present

## 2017-03-04 DIAGNOSIS — F1994 Other psychoactive substance use, unspecified with psychoactive substance-induced mood disorder: Secondary | ICD-10-CM

## 2017-03-04 DIAGNOSIS — F1021 Alcohol dependence, in remission: Secondary | ICD-10-CM | POA: Diagnosis present

## 2017-03-04 DIAGNOSIS — E876 Hypokalemia: Secondary | ICD-10-CM | POA: Diagnosis present

## 2017-03-04 LAB — CBC
HEMATOCRIT: 43.1 % (ref 40.0–52.0)
HEMOGLOBIN: 14.8 g/dL (ref 13.0–18.0)
MCH: 34.4 pg — ABNORMAL HIGH (ref 26.0–34.0)
MCHC: 34.4 g/dL (ref 32.0–36.0)
MCV: 100 fL (ref 80.0–100.0)
Platelets: 136 10*3/uL — ABNORMAL LOW (ref 150–440)
RBC: 4.31 MIL/uL — AB (ref 4.40–5.90)
RDW: 14.1 % (ref 11.5–14.5)
WBC: 5.8 10*3/uL (ref 3.8–10.6)

## 2017-03-04 LAB — COMPREHENSIVE METABOLIC PANEL
ALBUMIN: 4.3 g/dL (ref 3.5–5.0)
ALK PHOS: 120 U/L (ref 38–126)
ALT: 75 U/L — ABNORMAL HIGH (ref 17–63)
ANION GAP: 12 (ref 5–15)
AST: 128 U/L — AB (ref 15–41)
BILIRUBIN TOTAL: 2.4 mg/dL — AB (ref 0.3–1.2)
BUN: 18 mg/dL (ref 6–20)
CALCIUM: 8.6 mg/dL — AB (ref 8.9–10.3)
CO2: 30 mmol/L (ref 22–32)
Chloride: 92 mmol/L — ABNORMAL LOW (ref 101–111)
Creatinine, Ser: 1.11 mg/dL (ref 0.61–1.24)
GFR calc Af Amer: >60 mL/min (ref >60)
GFR calc non Af Amer: >60 mL/min (ref >60)
GLUCOSE: 132 mg/dL — AB (ref 65–99)
Potassium: 3.2 mmol/L — ABNORMAL LOW (ref 3.5–5.1)
SODIUM: 134 mmol/L — AB (ref 135–145)
TOTAL PROTEIN: 7.1 g/dL (ref 6.5–8.1)

## 2017-03-04 LAB — URINE DRUG SCREEN, QUALITATIVE (ARMC ONLY)
AMPHETAMINES, UR SCREEN: NOT DETECTED
Barbiturates, Ur Screen: POSITIVE — AB
Benzodiazepine, Ur Scrn: NOT DETECTED
COCAINE METABOLITE, UR ~~LOC~~: NOT DETECTED
Cannabinoid 50 Ng, Ur ~~LOC~~: NOT DETECTED
MDMA (Ecstasy)Ur Screen: NOT DETECTED
METHADONE SCREEN, URINE: NOT DETECTED
OPIATE, UR SCREEN: NOT DETECTED
PHENCYCLIDINE (PCP) UR S: NOT DETECTED
Tricyclic, Ur Screen: NOT DETECTED

## 2017-03-04 LAB — ETHANOL: Alcohol, Ethyl (B): <10 mg/dL (ref <10)

## 2017-03-04 MED ORDER — ONDANSETRON HCL 4 MG/2ML IJ SOLN
INTRAMUSCULAR | Status: AC
  Filled 2017-03-04: qty 2

## 2017-03-04 MED ORDER — ONDANSETRON HCL 4 MG/2ML IJ SOLN
4.0000 mg | Freq: Once | INTRAMUSCULAR | Status: AC
  Administered 2017-03-04: 4 mg via INTRAVENOUS

## 2017-03-04 MED ORDER — LORAZEPAM 2 MG/ML IJ SOLN: 0.0000 mg | Freq: Two times a day (BID) | INTRAMUSCULAR | Status: DC

## 2017-03-04 MED ORDER — LORAZEPAM 2 MG PO TABS: 0.0000 mg | ORAL_TABLET | Freq: Two times a day (BID) | ORAL | Status: DC

## 2017-03-04 MED ORDER — THIAMINE HCL 100 MG/ML IJ SOLN
100.0000 mg | Freq: Every day | INTRAMUSCULAR | Status: DC
  Administered 2017-03-04: 100 mg via INTRAVENOUS
  Filled 2017-03-04: qty 2

## 2017-03-04 MED ORDER — VITAMIN B-1 100 MG PO TABS: 100.0000 mg | ORAL_TABLET | Freq: Every day | ORAL | Status: DC

## 2017-03-04 MED ORDER — LORAZEPAM 2 MG/ML IJ SOLN
0.0000 mg | Freq: Four times a day (QID) | INTRAMUSCULAR | Status: DC
  Administered 2017-03-04: 2 mg via INTRAVENOUS
  Administered 2017-03-05: 1 mg via INTRAVENOUS
  Filled 2017-03-04 (×2): qty 1

## 2017-03-04 MED ORDER — LORAZEPAM 2 MG PO TABS: 0.0000 mg | ORAL_TABLET | Freq: Four times a day (QID) | ORAL | Status: DC

## 2017-03-04 MED ORDER — OXYMETAZOLINE HCL 0.05 % NA SOLN
NASAL | Status: AC
  Filled 2017-03-04: qty 15

## 2017-03-04 MED ORDER — OXYMETAZOLINE HCL 0.05 % NA SOLN
1.0000 | Freq: Once | NASAL | Status: AC
  Administered 2017-03-04: 1 via NASAL

## 2017-03-04 NOTE — ED Triage Notes (Signed)
Pt arrived to the ED accompanied by his daughter requesting detox from alcohol. Pt was seen I this ED yesterday for weakness and tremors. Pt is AOx4 in no apparent distress with mild tremors.

## 2017-03-04 NOTE — ED Notes (Signed)
BEHAVIORAL HEALTH ROUNDING Patient sleeping: No. Patient alert and oriented: yes Behavior appropriate: Yes.  ; If no, describe:  Nutrition and fluids offered: yes Toileting and hygiene offered: Yes  Sitter present: q15 minute observations and security  monitoring Law enforcement present: Yes  ODS  

## 2017-03-04 NOTE — ED Notes (Signed)
Pt up ambulating to hallway BR, steady gait; no distress noted, no c/o voiced

## 2017-03-04 NOTE — Progress Notes (Signed)
Physical Therapy Treatment Patient Details Name: John Booker MRN: [id number] DOB: [birth year] Today's Date: 03/04/2017    History of Present Illness Pt is a 66 y/o M who presented for weakness, decreased oral intake.  Pt reports that 2 weeks ago he fell and hit his head.  Per daughter the pt is an alcoholic.  Per chart review there is concern for depression and suicidal ideation, per chart pysch believes the pt is clear/safe to d/c home from a psychiatric standpoint.     PT Comments    Pt admitted with above diagnosis. Pt currently with functional limitations due to the deficits listed below (see PT Problem List). John Booker presents with guarded posture and shortened step length Bil when ambulating.  Mild instability noted with higher level balance activities. Recommending OPPT to address balance impairments. Pt will benefit from skilled PT to increase their independence and safety with mobility to allow discharge to the venue listed below.      Follow Up Recommendations  Outpatient PT     Equipment Recommendations  None recommended by PT    Recommendations for Other Services       Precautions / Restrictions Precautions Precautions: Fall Restrictions Weight Bearing Restrictions: No    Mobility  Bed Mobility Overal bed mobility: Independent             General bed mobility comments: No physical assist or cues required  Transfers Overall transfer level: Needs assistance Equipment used: 1 person hand held assist Transfers: Sit to/from Stand Sit to Stand: Min assist         General transfer comment: 1 person HHA to stand as pt rising from a very low bed.  Pt does not require assist for stand>sit.   Ambulation/Gait Ambulation/Gait assistance: Supervision Ambulation Distance (Feet): 80 Feet Assistive device: None Gait Pattern/deviations: Decreased stride length Gait velocity: decreased Gait velocity interpretation: Below normal speed for age/gender General Gait  Details: Guarded posture and pt taking short steps with dec Bil arm swing and trunk rotation.  No signs of instability but pt likely would lose his balance with perturbation.    Stairs            Wheelchair Mobility    Modified Rankin (Stroke Patients Only)       Balance Overall balance assessment: Needs assistance Sitting-balance support: Feet supported;No upper extremity supported Sitting balance-Leahy Scale: Normal     Standing balance support: No upper extremity supported;During functional activity Standing balance-Leahy Scale: Fair Standing balance comment: Pt would likely lose balance with perturbation             High level balance activites: Sudden stops;Head turns;Turns;Direction changes High Level Balance Comments: Very mild instability with higher level balance activities            Cognition Arousal/Alertness: Awake/alert Behavior During Therapy: Flat affect Overall Cognitive Status: Within Functional Limits for tasks assessed                                        Exercises      General Comments        Pertinent Vitals/Pain Pain Assessment: No/denies pain    Home Living Family/patient expects to be discharged to:: Private residence Living Arrangements: Alone Available Help at Discharge: Family;Available PRN/intermittently Type of Home: House Home Access: Stairs to enter Entrance Stairs-Rails: Right Home Layout: One level Home Equipment: Grab bars - tub/shower  Prior Function Level of Independence: Independent      Comments: Pt independent with all ADLs/IADLs, mobility without AD.  1 fall in the past 6 months.     PT Goals (current goals can now be found in the care plan section) Acute Rehab PT Goals Patient Stated Goal: to go home PT Goal Formulation: With patient Time For Goal Achievement: 03/18/17 Potential to Achieve Goals: Good    Frequency    Min 2X/week      PT Plan      Co-evaluation               AM-PAC PT "6 Clicks" Daily Activity  Outcome Measure  Difficulty turning over in bed (including adjusting bedclothes, sheets and blankets)?: None Difficulty moving from lying on back to sitting on the side of the bed? : None Difficulty sitting down on and standing up from a chair with arms (e.g., wheelchair, bedside commode, etc,.)?: Unable Help needed moving to and from a bed to chair (including a wheelchair)?: A Little Help needed walking in hospital room?: A Little Help needed climbing 3-5 steps with a railing? : A Little 6 Click Score: 18    End of Session Equipment Utilized During Treatment: Gait belt Activity Tolerance: Patient tolerated treatment well Patient left: in bed Nurse Communication: Mobility status PT Visit Diagnosis: Unsteadiness on feet (R26.81);Other abnormalities of gait and mobility (R26.89)     Time: 0900-0910 PT Time Calculation (min) (ACUTE ONLY): 10 min  Charges:                       G Codes:  Functional Assessment Tool Used: AM-PAC 6 Clicks Basic Mobility;Clinical judgement Functional Limitation: Mobility: Walking and moving around Mobility: Walking and Moving Around Current Status (X4801): At least 40 percent but less than 60 percent impaired, limited or restricted Mobility: Walking and Moving Around Goal Status [phone]): At least 1 percent but less than 20 percent impaired, limited or restricted    Collie Siad PT, DPT 03/04/2017, 9:26 AM

## 2017-03-04 NOTE — ED Notes (Signed)
Patient observed lying in bed with eyes closed  Even, unlabored respirations observed   NAD pt appears to be sleeping  I will continue to monitor along with every 15 minute visual observations and ongoing security monitoring    

## 2017-03-04 NOTE — ED Provider Notes (Signed)
-----------------------------------------   7:54 AM on 03/04/2017 -----------------------------------------   Blood pressure (!) 173/85, pulse 93, temperature 97.7 F (36.5 C), temperature source Oral, resp. rate 20, height 6\' 3"  (1.905 m), weight 99.8 kg (220 lb), SpO2 99 %.  The patient had no acute events since last update.  Patient has been seen by TTS, currently awaiting psychiatric evaluation.  Patient's medical workup shows significant ethanol elevation of 387.  Possible mild alcohol ketoacidosis.  We will continue to monitor in the emergency department while awaiting psychiatric evaluation.    Harvest Dark, MD 03/04/17 [phone]

## 2017-03-04 NOTE — ED Notes (Signed)
ED Is the patient under IVC or is there intent for IVC: Yes.   Is the patient medically cleared: Yes.   Is there vacancy in the ED BHU: Yes.   Is the population mix appropriate for patient: Yes.   Is the patient awaiting placement in inpatient or outpatient setting: Yes.   Has the patient had a psychiatric consult: SOC to be performed  Survey of unit performed for contraband, proper placement and condition of furniture, tampering with fixtures in bathroom, shower, and each patient room: Yes.  ; Findings:  APPEARANCE/BEHAVIOR Cooperative - irritable  NEURO ASSESSMENT Orientation: oriented x3  Denies pain Hallucinations: No.None noted (Hallucinations) denies Speech: Normal Gait: normal RESPIRATORY ASSESSMENT Even  Unlabored respirations  CARDIOVASCULAR ASSESSMENT Pulses equal   regular rate  Skin warm and dry   GASTROINTESTINAL ASSESSMENT no GI complaint EXTREMITIES Full ROM  PLAN OF CARE Provide calm/safe environment. Vital signs assessed twice daily. ED BHU Assessment once each 12-hour shift. Collaborate with TTS when available or as condition indicates. Assure the ED provider has rounded once each shift. Provide and encourage hygiene. Provide redirection as needed. Assess for escalating behavior; address immediately and inform ED provider.  Assess family dynamic and appropriateness for visitation as needed: Yes.  ; If necessary, describe findings:  Educate the patient/family about BHU procedures/visitation: Yes.  ; If necessary, describe findings:

## 2017-03-04 NOTE — ED Notes (Signed)
Pt's daughter, Luisa Hart, came to visit the patient for approximately 5 minutes; visitor was wanded by security. She also left a phone number to be contacted with any updates on the patient. Pt gave verbal permission to give out his medical and treatment information to his daughter. Daughter was given password of the patient to provide when calling and asking for his information. Her phone number is [phone] in the event that the staff needs to get in touch with her.

## 2017-03-04 NOTE — ED Notes (Addendum)
BEHAVIORAL HEALTH ROUNDING Patient sleeping: NO Patient alert and oriented: YES Behavior appropriate: YES Nutrition and fluids offered: YES Toileting and hygiene offered: YES Sitter present: YES Event organiser present: YES

## 2017-03-04 NOTE — BH Assessment (Signed)
Referral submitted to [facility] for review.

## 2017-03-04 NOTE — ED Notes (Signed)
Verbal orders for pt requested nausea meds and afrin recieved

## 2017-03-04 NOTE — ED Notes (Signed)
BEHAVIORAL HEALTH ROUNDING Patient sleeping: NO Patient alert and oriented: YES Behavior appropriate: YES Nutrition and fluids offered: YES Toileting and hygiene offered: YES Sitter present: YES Event organiser present: YES

## 2017-03-04 NOTE — BH Assessment (Signed)
Assessment Note  John Booker is an 66 y.o. male presenting to the ED, via his daughter, for "weakness" and alcohol intoxication.  Pt presented to the ED yesterday and this morning for the same complaints.  Per daughter report, EMS was called out to patient's home and found the pt intoxicated and sitting on the couch with a gun beside him.  Pt's daughter reportedly removed the gun from pt's possession.  Pt denies SI/HI.  He is requesting alcohol detox.  Diagnosis: Substance Induced Disorder  Past Medical History:  Past Medical History:  Diagnosis Date  . Hypertension     Past Surgical History:  Procedure Laterality Date  . INCISION AND DRAINAGE PERIRECTAL ABSCESS N/A 11/19/2015   Procedure: IRRIGATION AND DEBRIDEMENT PERIRECTAL ABSCESS and debridement of scrotal abscess;  Surgeon: Florene Glen, MD;  Location: [facility] ORS;  Service: General;  Laterality: N/A;  . INCISION AND DRAINAGE PERIRECTAL ABSCESS N/A 11/20/2015   Procedure: IRRIGATION AND DEBRIDEMENT PERIRECTAL ABSCESS / WITH DRESSING CHANGE;  Surgeon: Florene Glen, MD;  Location: [facility] ORS;  Service: General;  Laterality: N/A;  peri rectal site  . INCISION AND DRAINAGE PERIRECTAL ABSCESS N/A 11/21/2015   Procedure: IRRIGATION AND DEBRIDEMENT PERIRECTAL ABSCESS WITH DRESSING CHANGE;  Surgeon: Florene Glen, MD;  Location: [facility] ORS;  Service: General;  Laterality: N/A;  . MINOR HEMORRHOIDECTOMY  1989    Family History:  Family History  Problem Relation Age of Onset  . Hypertension Father     Social History:  reports that he has quit smoking. His smoking use included cigarettes. He has a 19.00 pack-year smoking history. he has never used smokeless tobacco. He reports that he does not drink alcohol or use drugs.  Additional Social History:  Alcohol / Drug Use Pain Medications: See PTA Prescriptions: See PTA Over the Counter: See PTA History of alcohol / drug use?: Yes(Pt reports occasional use) Negative Consequences of  Use: Personal relationships Withdrawal Symptoms: Agitation  CIWA: CIWA-Ar BP: (!) 159/90 Pulse Rate: (!) 102 Nausea and Vomiting: no nausea and no vomiting Tactile Disturbances: very mild itching, pins and needles, burning or numbness Tremor: moderate, with patient's arms extended Auditory Disturbances: not present Paroxysmal Sweats: two Visual Disturbances: not present Anxiety: two Headache, Fullness in Head: very mild Agitation: somewhat more than normal activity Orientation and Clouding of Sensorium: oriented and can do serial additions CIWA-Ar Total: 11 COWS:    Allergies:  Allergies  Allergen Reactions  . Adalat [Nifedipine]     Foot swelling.     Home Medications:  (Not in a hospital admission)  OB/GYN Status:  No LMP for male patient.  General Assessment Data Location of Assessment: [facility] ED TTS Assessment: In system Is this a Tele or Face-to-Face Assessment?: Face-to-Face Is this an Initial Assessment or a Re-assessment for this encounter?: Initial Assessment Marital status: Single Maiden name: n/a Is patient pregnant?: No Pregnancy Status: No Living Arrangements: Alone Can pt return to current living arrangement?: Yes Admission Status: Voluntary Is patient capable of signing voluntary admission?: Yes Referral Source: Self/Family/Friend Insurance type: Medicare [facility])     Crisis Care Plan Living Arrangements: Alone Legal Guardian: Other:(self) Name of Psychiatrist: None Name of Therapist: None  Education Status Is patient currently in school?: No Current Grade: n/a Highest grade of school patient has completed: 12th Name of school: [employer] person: n/a  Risk to self with the past 6 months Suicidal Ideation: Yes-Currently Present Has patient been a risk to self within the past 6 months  prior to admission? : No Suicidal Intent: No Has patient had any suicidal intent within the past 6 months prior to admission? : No Is patient at  risk for suicide?: No Suicidal Plan?: No Has patient had any suicidal plan within the past 6 months prior to admission? : No Access to Means: No What has been your use of drugs/alcohol within the last 12 months?: Pt reports occasional alcohol use Previous Attempts/Gestures: No Other Self Harm Risks: non identified Triggers for Past Attempts: None known(None reported) Intentional Self Injurious Behavior: None Family Suicide History: No Recent stressful life event(s): Other (Comment) Persecutory voices/beliefs?: No Depression: No Substance abuse history and/or treatment for substance abuse?: Yes Suicide prevention information given to non-admitted patients: Not applicable  Risk to Others within the past 6 months Homicidal Ideation: No Does patient have any lifetime risk of violence toward others beyond the six months prior to admission? : No Thoughts of Harm to Others: No Current Homicidal Intent: No Current Homicidal Plan: No Access to Homicidal Means: No Identified Victim: none identified History of harm to others?: No Assessment of Violence: None Noted Does patient have access to weapons?: Yes (Comment) Criminal Charges Pending?: No Does patient have a court date: No Is patient on probation?: No  Psychosis Hallucinations: None noted Delusions: None noted  Mental Status Report Appearance/Hygiene: In scrubs Eye Contact: Poor Motor Activity: Freedom of movement Speech: Logical/coherent, Aggressive Level of Consciousness: Drowsy Mood: Irritable Affect: Irritable Anxiety Level: None Thought Processes: Relevant Judgement: Impaired Orientation: Place, Time, Situation, Person Obsessive Compulsive Thoughts/Behaviors: None  Cognitive Functioning Concentration: Normal Memory: Recent Intact IQ: Average Insight: Poor Impulse Control: Poor Appetite: Good Sleep: No Change Vegetative Symptoms: None  ADLScreening [facility] Assessment Services) Patient's cognitive ability  adequate to safely complete daily activities?: Yes Patient able to express need for assistance with ADLs?: Yes Independently performs ADLs?: Yes (appropriate for developmental age)  Prior Inpatient Therapy Prior Inpatient Therapy: No Prior Therapy Dates: n/a Prior Therapy Facilty/Provider(s): n/a Reason for Treatment: n/a  Prior Outpatient Therapy Prior Outpatient Therapy: No Prior Therapy Dates: n/a Prior Therapy Facilty/Provider(s): n/a Reason for Treatment: n/a  ADL Screening (condition at time of admission) Patient's cognitive ability adequate to safely complete daily activities?: Yes Patient able to express need for assistance with ADLs?: Yes Independently performs ADLs?: Yes (appropriate for developmental age)       Abuse/Neglect Assessment (Assessment to be complete while patient is alone) Abuse/Neglect Assessment Can Be Completed: Unable to assess, patient is non-responsive or altered mental status Values / Beliefs Cultural Requests During Hospitalization: Other (comment)(Pt doesn't respond) Spiritual Requests During Hospitalization: (Pt doesn't respond) Consults Spiritual Care Consult Needed: No Social Work Consult Needed: No Regulatory affairs officer (For Healthcare) Does Patient Have a Medical Advance Directive?: No Would patient like information on creating a medical advance directive?: No - Patient declined    Additional Information 1:1 In Past 12 Months?: No CIRT Risk: No Elopement Risk: No Does patient have medical clearance?: Yes     Disposition:  Disposition Initial Assessment Completed for this Encounter: Yes Disposition of Patient: Pending Review with psychiatrist, Referred to(Referred to [facility])  On Site Evaluation by:   Reviewed with Physician:    Oneita Hurt 03/04/2017 10:06 PM

## 2017-03-04 NOTE — ED Provider Notes (Signed)
-----------------------------------------   8:56 AM on 03/04/2017 -----------------------------------------  Patient has been seen by psychiatry they believe the patient is clear/safe for discharge home from a psychiatric standpoint.  Patient did have a significant alcohol elevation however this was 12 hours ago.  At this time I believe the patient is medically stable for discharge as well.  I discussed this with the patient he is agreeable to this plan.  I also discussed referral to residential treatment services of [place], patient is agreeable and will follow up.  Awaiting arrival of the patient's daughter for discharge.   Harvest Dark, MD 03/04/17 [phone]

## 2017-03-04 NOTE — ED Notes (Signed)
Telephone report given to [facility] MD. [facility] computer set up in the room with the lights on.

## 2017-03-04 NOTE — ED Notes (Signed)

## 2017-03-04 NOTE — ED Provider Notes (Signed)
[facility] Emergency Department Provider Note    First MD Initiated Contact with Patient 03/04/17 1929     (approximate)  I have reviewed the triage vital signs and the nursing notes.   HISTORY  Chief Complaint Alcohol Problem   HPI John Booker is a 66 y.o. male with history of alcohol abuse and hypertension presents to the emergency department with request for alcohol detox.  Patient's daughter at bedside states that the patient repetitively stated that he would "put my gun in my mouth and blow my.  Patient's daughter states that on EMS arrival yesterday patient had pistol at bedside along with a glass of alcohol.  Patient denies any suicidal ideation at present however patient does request alcohol detox.  Patient denies any EtOH ingestion today.   Past Medical History:  Diagnosis Date  . Hypertension     Patient Active Problem List   Diagnosis Date Noted  . Abnormal glucose 12/20/2015  . Peri-rectal abscess 11/19/2015  . Perineal abscess 11/15/2015  . HTN (hypertension) 11/15/2015  . Vitamin D deficiency 11/15/2015    Past Surgical History:  Procedure Laterality Date  . INCISION AND DRAINAGE PERIRECTAL ABSCESS N/A 11/19/2015   Procedure: IRRIGATION AND DEBRIDEMENT PERIRECTAL ABSCESS and debridement of scrotal abscess;  Surgeon: Florene Glen, MD;  Location: [facility] ORS;  Service: General;  Laterality: N/A;  . INCISION AND DRAINAGE PERIRECTAL ABSCESS N/A 11/20/2015   Procedure: IRRIGATION AND DEBRIDEMENT PERIRECTAL ABSCESS / WITH DRESSING CHANGE;  Surgeon: Florene Glen, MD;  Location: [facility] ORS;  Service: General;  Laterality: N/A;  peri rectal site  . INCISION AND DRAINAGE PERIRECTAL ABSCESS N/A 11/21/2015   Procedure: IRRIGATION AND DEBRIDEMENT PERIRECTAL ABSCESS WITH DRESSING CHANGE;  Surgeon: Florene Glen, MD;  Location: [facility] ORS;  Service: General;  Laterality: N/A;  . [place]    Prior to Admission medications     Medication Sig Start Date End Date Taking? Authorizing Provider  amLODipine (NORVASC) 10 MG tablet Take 1 tablet (10 mg total) by mouth daily. 03/31/16   Karamalegos, Devonne Doughty, DO  hydrochlorothiazide (HYDRODIURIL) 25 MG tablet TAKE ONE TABLET BY MOUTH EVERY DAY 05/16/16   Karamalegos, Devonne Doughty, DO  lisinopril (PRINIVIL,ZESTRIL) 20 MG tablet Take 1 tablet (20 mg total) by mouth daily. 02/18/17   Olin Hauser, DO    Allergies Adalat [nifedipine]  Family History  Problem Relation Age of Onset  . Hypertension Father     Social History Social History   Tobacco Use  . Smoking status: Former Smoker    Packs/day: 0.50    Years: 38.00    Pack years: 19.00    Types: Cigarettes  . Smokeless tobacco: Never Used  Substance Use Topics  . Alcohol use: No  . Drug use: No    Review of Systems Constitutional: No fever/chills Eyes: No visual changes. ENT: No sore throat. Cardiovascular: Denies chest pain. Respiratory: Denies shortness of breath. Gastrointestinal: No abdominal pain.  No nausea, no vomiting.  No diarrhea.  No constipation. Genitourinary: Negative for dysuria. Musculoskeletal: Negative for neck pain.  Negative for back pain. Integumentary: Negative for rash. Neurological: Negative for headaches, focal weakness or numbness. Psychiatric:Positive for alcohol abuse  ____________________________________________   PHYSICAL EXAM:  VITAL SIGNS: ED Triage Vitals  Enc Vitals Group     BP 03/04/17 1917 (!) 159/90     Pulse Rate 03/04/17 1917 (!) 102     Resp 03/04/17 1917 18     Temp 03/04/17  1917 99.5 F (37.5 C)     Temp Source 03/04/17 1917 Oral     SpO2 03/04/17 1917 98 %     Weight 03/04/17 1918 99.8 kg (220 lb)     Height 03/04/17 1918 1.905 m (6\' 3" )     Head Circumference --      Peak Flow --      Pain Score 03/04/17 1917 0     Pain Loc --      Pain Edu? --      Excl. in [place]? --     Constitutional: Alert and oriented. Well appearing and in  no acute distress.  Tremulous Eyes: Conjunctivae are normal. PERRL. EOMI. Head: Atraumatic. Mouth/Throat: Mucous membranes are moist.  Oropharynx non-erythematous. Neck: No stridor.   Cardiovascular: Tachycardia, regular rhythm. Good peripheral circulation. Grossly normal heart sounds. Respiratory: Normal respiratory effort.  No retractions. Lungs CTAB. Gastrointestinal: Soft and nontender. No distention.  Musculoskeletal: No lower extremity tenderness nor edema. No gross deformities of extremities. Neurologic:  Normal speech and language. No gross focal neurologic deficits are appreciated.  Skin:  Skin is warm, dry and intact. No rash noted. Psychiatric: Mood and affect are normal. Speech and behavior are normal.  ____________________________________________   LABS (all labs ordered are listed, but only abnormal results are displayed)  Labs Reviewed - No data to display    RADIOLOGY I, C[place], personally viewed and evaluated these images (plain radiographs) as part of my medical decision making, as well as reviewing the written report by the radiologist.  Dg Chest 2 View  Result Date: 03/03/2017 CLINICAL DATA:  Weakness. EXAM: CHEST  2 VIEW COMPARISON:  Radiographs of May 26, 2012. FINDINGS: The heart size and mediastinal contours are within normal limits. Both lungs are clear. No pneumothorax or pleural effusion is noted. The visualized skeletal structures are unremarkable. IMPRESSION: No active cardiopulmonary disease. Electronically Signed   By: Marijo Conception, M.D.   On: 03/03/2017 20:45   Ct Head Wo Contrast  Result Date: 03/03/2017 CLINICAL DATA:  66 year old male with head trauma. EXAM: CT HEAD WITHOUT CONTRAST TECHNIQUE: Contiguous axial images were obtained from the base of the skull through the vertex without intravenous contrast. COMPARISON:  None. FINDINGS: Brain: The ventricles and sulci appropriate size for patient's age. Minimal periventricular and deep  white matter chronic microvascular ischemic changes noted. There is no acute intracranial hemorrhage. No mass effect or midline shift. No extra-axial fluid collection. Vascular: No hyperdense vessel or unexpected calcification. Skull: Normal. Negative for fracture or focal lesion. Sinuses/Orbits: There is diffuse mucoperiosteal thickening of paranasal sinuses. No air-fluid levels. The mastoid air cells are clear. Other: None IMPRESSION: 1. No acute intracranial hemorrhage. 2. Mild chronic microvascular ischemic changes. Electronically Signed   By: Anner Crete M.D.   On: 03/03/2017 21:01     Procedures   ____________________________________________   INITIAL IMPRESSION / ASSESSMENT AND PLAN / ED COURSE  As part of my medical decision making, I reviewed the following data within the electronic MEDICAL RECORD [id number] year old male presented with history and physical exam consistent with alcohol withdrawal.  Patient unable to perform ADLs patient unable to ambulate and as such defecated on himself while in the emergency department.  As such patient discussed with internal medicine staff for hospital admission for alcohol withdrawal with plans for future placement for alcohol detox ____________________________________________  FINAL CLINICAL IMPRESSION(S) / ED DIAGNOSES  Final diagnoses:  Alcohol withdrawal syndrome without complication ([place])  Suicidal ideation  MEDICATIONS GIVEN DURING THIS VISIT:  Medications  LORazepam (ATIVAN) injection 0-4 mg (not administered)    Or  LORazepam (ATIVAN) tablet 0-4 mg (not administered)  LORazepam (ATIVAN) injection 0-4 mg (not administered)    Or  LORazepam (ATIVAN) tablet 0-4 mg (not administered)  thiamine (VITAMIN B-1) tablet 100 mg (not administered)    Or  thiamine (B-1) injection 100 mg (not administered)     ED Discharge Orders    None       Note:  This document was prepared using Dragon voice recognition software and may  include unintentional dictation errors.    Gregor Hams, MD 03/05/17 [phone]

## 2017-03-04 NOTE — ED Notes (Signed)
[place] camera set up in his room - report provided to [facility] MD  Pt awakened for consult - denies SI/HI and denies AH/VH  Answered his questions  - educated him about alcohol detox and provided him with pamphlet from [facility]   Continue to monitor

## 2017-03-04 NOTE — Discharge Instructions (Signed)
You have been seen in the emergency department for a  psychiatric concern. and alcohol use You have been evaluated both medically as well as psychiatrically. Please follow-up with your outpatient resources provided. Return to the emergency department for any worsening symptoms, or any thoughts of hurting yourself or anyone else so that we may attempt to help you.

## 2017-03-05 ENCOUNTER — Other Ambulatory Visit: Payer: Self-pay

## 2017-03-05 DIAGNOSIS — F10230 Alcohol dependence with withdrawal, uncomplicated: Secondary | ICD-10-CM | POA: Diagnosis present

## 2017-03-05 DIAGNOSIS — A0472 Enterocolitis due to Clostridium difficile, not specified as recurrent: Secondary | ICD-10-CM | POA: Diagnosis present

## 2017-03-05 DIAGNOSIS — E871 Hypo-osmolality and hyponatremia: Secondary | ICD-10-CM | POA: Diagnosis not present

## 2017-03-05 DIAGNOSIS — F1010 Alcohol abuse, uncomplicated: Secondary | ICD-10-CM | POA: Diagnosis not present

## 2017-03-05 DIAGNOSIS — Z79899 Other long term (current) drug therapy: Secondary | ICD-10-CM | POA: Diagnosis not present

## 2017-03-05 DIAGNOSIS — E876 Hypokalemia: Secondary | ICD-10-CM | POA: Diagnosis present

## 2017-03-05 DIAGNOSIS — Z23 Encounter for immunization: Secondary | ICD-10-CM | POA: Diagnosis not present

## 2017-03-05 DIAGNOSIS — Z87891 Personal history of nicotine dependence: Secondary | ICD-10-CM | POA: Diagnosis not present

## 2017-03-05 DIAGNOSIS — I10 Essential (primary) hypertension: Secondary | ICD-10-CM | POA: Diagnosis present

## 2017-03-05 DIAGNOSIS — Y900 Blood alcohol level of less than 20 mg/100 ml: Secondary | ICD-10-CM | POA: Diagnosis not present

## 2017-03-05 DIAGNOSIS — R45851 Suicidal ideations: Secondary | ICD-10-CM | POA: Diagnosis present

## 2017-03-05 DIAGNOSIS — F1021 Alcohol dependence, in remission: Secondary | ICD-10-CM | POA: Diagnosis present

## 2017-03-05 DIAGNOSIS — B9689 Other specified bacterial agents as the cause of diseases classified elsewhere: Secondary | ICD-10-CM | POA: Diagnosis not present

## 2017-03-05 DIAGNOSIS — F1994 Other psychoactive substance use, unspecified with psychoactive substance-induced mood disorder: Secondary | ICD-10-CM | POA: Diagnosis present

## 2017-03-05 DIAGNOSIS — E861 Hypovolemia: Secondary | ICD-10-CM | POA: Diagnosis present

## 2017-03-05 LAB — C DIFFICILE QUICK SCREEN W PCR REFLEX
C DIFFICILE (CDIFF) TOXIN: NEGATIVE
C Diff antigen: POSITIVE — AB

## 2017-03-05 LAB — CLOSTRIDIUM DIFFICILE BY PCR, REFLEXED: CDIFFPCR: POSITIVE — AB

## 2017-03-05 MED ORDER — LISINOPRIL 20 MG PO TABS
20.0000 mg | ORAL_TABLET | Freq: Every day | ORAL | Status: DC
  Administered 2017-03-05 – 2017-03-10 (×6): 20 mg via ORAL
  Filled 2017-03-05 (×6): qty 1

## 2017-03-05 MED ORDER — ONDANSETRON HCL 4 MG PO TABS: 4.0000 mg | ORAL_TABLET | Freq: Four times a day (QID) | ORAL | Status: DC | PRN

## 2017-03-05 MED ORDER — ENOXAPARIN SODIUM 40 MG/0.4ML ~~LOC~~ SOLN
40.0000 mg | SUBCUTANEOUS | Status: DC
  Administered 2017-03-05 – 2017-03-09 (×5): 40 mg via SUBCUTANEOUS
  Filled 2017-03-05 (×5): qty 0.4

## 2017-03-05 MED ORDER — FOLIC ACID 1 MG PO TABS
1.0000 mg | ORAL_TABLET | Freq: Every day | ORAL | Status: DC
  Administered 2017-03-05 – 2017-03-10 (×6): 1 mg via ORAL
  Filled 2017-03-05 (×6): qty 1

## 2017-03-05 MED ORDER — CLONIDINE HCL 0.1 MG PO TABS
0.2000 mg | ORAL_TABLET | Freq: Every day | ORAL | Status: DC
  Administered 2017-03-05 – 2017-03-10 (×6): 0.2 mg via ORAL
  Filled 2017-03-05 (×6): qty 2

## 2017-03-05 MED ORDER — POTASSIUM CHLORIDE CRYS ER 20 MEQ PO TBCR
40.0000 meq | EXTENDED_RELEASE_TABLET | Freq: Once | ORAL | Status: AC
  Administered 2017-03-05: 40 meq via ORAL
  Filled 2017-03-05: qty 2

## 2017-03-05 MED ORDER — ADULT MULTIVITAMIN W/MINERALS CH
1.0000 | ORAL_TABLET | Freq: Every day | ORAL | Status: DC
  Administered 2017-03-05 – 2017-03-10 (×6): 1 via ORAL
  Filled 2017-03-05 (×6): qty 1

## 2017-03-05 MED ORDER — VANCOMYCIN 50 MG/ML ORAL SOLUTION
125.0000 mg | Freq: Four times a day (QID) | ORAL | Status: DC
  Administered 2017-03-05 – 2017-03-10 (×19): 125 mg via ORAL
  Filled 2017-03-05 (×26): qty 2.5

## 2017-03-05 MED ORDER — AMLODIPINE BESYLATE 10 MG PO TABS
10.0000 mg | ORAL_TABLET | Freq: Every day | ORAL | Status: DC
  Administered 2017-03-05 – 2017-03-10 (×6): 10 mg via ORAL
  Filled 2017-03-05 (×6): qty 1

## 2017-03-05 MED ORDER — ACETAMINOPHEN 650 MG RE SUPP: 650.0000 mg | Freq: Four times a day (QID) | RECTAL | Status: DC | PRN

## 2017-03-05 MED ORDER — LORAZEPAM 2 MG/ML IJ SOLN: 0.0000 mg | Freq: Four times a day (QID) | INTRAMUSCULAR | Status: AC

## 2017-03-05 MED ORDER — SODIUM CHLORIDE 0.9 % IV BOLUS (SEPSIS)
1000.0000 mL | Freq: Once | INTRAVENOUS | Status: AC
  Administered 2017-03-05: 1000 mL via INTRAVENOUS

## 2017-03-05 MED ORDER — SODIUM CHLORIDE 0.9 % IV SOLN
12.5000 mg | Freq: Four times a day (QID) | INTRAVENOUS | Status: DC | PRN
  Administered 2017-03-05: 12.5 mg via INTRAVENOUS
  Filled 2017-03-05: qty 0.5

## 2017-03-05 MED ORDER — LORAZEPAM 2 MG/ML IJ SOLN
0.0000 mg | Freq: Two times a day (BID) | INTRAMUSCULAR | Status: AC
  Filled 2017-03-05: qty 1

## 2017-03-05 MED ORDER — LORAZEPAM 2 MG/ML IJ SOLN: 1.0000 mg | Freq: Four times a day (QID) | INTRAMUSCULAR | Status: AC | PRN

## 2017-03-05 MED ORDER — THIAMINE HCL 100 MG/ML IJ SOLN
100.0000 mg | Freq: Every day | INTRAMUSCULAR | Status: DC
  Filled 2017-03-05 (×2): qty 2

## 2017-03-05 MED ORDER — ACETAMINOPHEN 325 MG PO TABS: 650.0000 mg | ORAL_TABLET | Freq: Four times a day (QID) | ORAL | Status: DC | PRN

## 2017-03-05 MED ORDER — VITAMIN B-1 100 MG PO TABS
100.0000 mg | ORAL_TABLET | Freq: Every day | ORAL | Status: DC
  Administered 2017-03-05 – 2017-03-10 (×6): 100 mg via ORAL
  Filled 2017-03-05 (×6): qty 1

## 2017-03-05 MED ORDER — LORAZEPAM 1 MG PO TABS: 1.0000 mg | ORAL_TABLET | Freq: Four times a day (QID) | ORAL | Status: AC | PRN

## 2017-03-05 MED ORDER — SODIUM CHLORIDE 0.9 % IV SOLN
INTRAVENOUS | Status: DC
  Administered 2017-03-05 – 2017-03-06 (×3): via INTRAVENOUS

## 2017-03-05 MED ORDER — ONDANSETRON HCL 4 MG/2ML IJ SOLN: 4.0000 mg | Freq: Four times a day (QID) | INTRAMUSCULAR | Status: DC | PRN

## 2017-03-05 MED ORDER — HYDROCHLOROTHIAZIDE 25 MG PO TABS
25.0000 mg | ORAL_TABLET | Freq: Every day | ORAL | Status: DC
  Administered 2017-03-05: 25 mg via ORAL
  Filled 2017-03-05: qty 1

## 2017-03-05 NOTE — ED Notes (Signed)
Called inpt unit and spoke with Sharalyn Ink - pt to transfer for admission - report provided prior to shift change [facility] to [facility]

## 2017-03-05 NOTE — H&P (Signed)
John Booker is an 66 y.o. male.   Chief Complaint: Alcohol problem HPI: The patient with past medical history of hypertension presents to the emergency department via EMS due to weakness.  The patient was found to be inebriated and holding his gun.  At that time he endorsed suicidal ideation.  Currently the patient denies suicidal thoughts.  Behavioral health is following him and will maintain custody of the patient while hospitalized.  As he is going through active withdrawal they will not take over care until he is medically managed which prompted the emergency department staff to call the hospitalist service for admission.  Past Medical History:  Diagnosis Date  . Hypertension     Past Surgical History:  Procedure Laterality Date  . INCISION AND DRAINAGE PERIRECTAL ABSCESS N/A 11/19/2015   Procedure: IRRIGATION AND DEBRIDEMENT PERIRECTAL ABSCESS and debridement of scrotal abscess;  Surgeon: Florene Glen, MD;  Location: [facility] ORS;  Service: General;  Laterality: N/A;  . INCISION AND DRAINAGE PERIRECTAL ABSCESS N/A 11/20/2015   Procedure: IRRIGATION AND DEBRIDEMENT PERIRECTAL ABSCESS / WITH DRESSING CHANGE;  Surgeon: Florene Glen, MD;  Location: [facility] ORS;  Service: General;  Laterality: N/A;  peri rectal site  . INCISION AND DRAINAGE PERIRECTAL ABSCESS N/A 11/21/2015   Procedure: IRRIGATION AND DEBRIDEMENT PERIRECTAL ABSCESS WITH DRESSING CHANGE;  Surgeon: Florene Glen, MD;  Location: [facility] ORS;  Service: General;  Laterality: N/A;  . MINOR HEMORRHOIDECTOMY  1989    Family History  Problem Relation Age of Onset  . Hypertension Father    Social History:  reports that he has quit smoking. His smoking use included cigarettes. He has a 19.00 pack-year smoking history. he has never used smokeless tobacco. He reports that he does not drink alcohol or use drugs.  Allergies:  Allergies  Allergen Reactions  . Adalat [Nifedipine]     Foot swelling.     Prior to Admission medications    Medication Sig Start Date End Date Taking? Authorizing Provider  amLODipine (NORVASC) 10 MG tablet Take 1 tablet (10 mg total) by mouth daily. 03/31/16  Yes Karamalegos, Devonne Doughty, DO  hydrochlorothiazide (HYDRODIURIL) 25 MG tablet TAKE ONE TABLET BY MOUTH EVERY DAY 05/16/16  Yes Karamalegos, Devonne Doughty, DO  lisinopril (PRINIVIL,ZESTRIL) 20 MG tablet Take 1 tablet (20 mg total) by mouth daily. 02/18/17  Yes Olin Hauser, DO     Results for orders placed or performed during the hospital encounter of 03/04/17 (from the past 48 hour(s))  CBC     Status: Abnormal   Collection Time: 03/04/17  7:46 PM  Result Value Ref Range   WBC 5.8 3.8 - 10.6 K/uL   RBC 4.31 (L) 4.40 - 5.90 MIL/uL   Hemoglobin 14.8 13.0 - 18.0 g/dL   HCT 43.1 40.0 - 52.0 %   MCV 100.0 80.0 - 100.0 fL   MCH 34.4 (H) 26.0 - 34.0 pg   MCHC 34.4 32.0 - 36.0 g/dL   RDW 14.1 11.5 - 14.5 %   Platelets 136 (L) 150 - 440 K/uL    Comment: Performed at [facility], [address] [place],  [address]  Comprehensive metabolic panel     Status: Abnormal   Collection Time: 03/04/17  7:46 PM  Result Value Ref Range   Sodium 134 (L) 135 - 145 mmol/L   Potassium 3.2 (L) 3.5 - 5.1 mmol/L   Chloride 92 (L) 101 - 111 mmol/L   CO2 30 22 - 32 mmol/L   Glucose,  Bld 132 (H) 65 - 99 mg/dL   BUN 18 6 - 20 mg/dL   Creatinine, Ser 1.11 0.61 - 1.24 mg/dL   Calcium 8.6 (L) 8.9 - 10.3 mg/dL   Total Protein 7.1 6.5 - 8.1 g/dL   Albumin 4.3 3.5 - 5.0 g/dL   AST 128 (H) 15 - 41 U/L   ALT 75 (H) 17 - 63 U/L   Alkaline Phosphatase 120 38 - 126 U/L   Total Bilirubin 2.4 (H) 0.3 - 1.2 mg/dL   GFR calc non Af Amer >60 >60 mL/min   GFR calc Af Amer >60 >60 mL/min    Comment: (NOTE) The eGFR has been calculated using the CKD EPI equation. This calculation has not been validated in all clinical situations. eGFR's persistently <60 mL/min signify possible Chronic Kidney Disease.    Anion gap 12 5 - 15    Comment:  Performed at [facility], [address] [place], [place] [address]  Ethanol     Status: None   Collection Time: 03/04/17  7:46 PM  Result Value Ref Range   Alcohol, Ethyl (B) <10 <10 mg/dL    Comment:        LOWEST DETECTABLE LIMIT FOR SERUM ALCOHOL IS 10 mg/dL FOR MEDICAL PURPOSES ONLY Performed at [facility], [address], [place], [place] [address]   Urine Drug Screen, Qualitative ([place] only)     Status: Abnormal   Collection Time: 03/04/17  7:46 PM  Result Value Ref Range   Tricyclic, Ur Screen NONE DETECTED NONE DETECTED   Amphetamines, Ur Screen NONE DETECTED NONE DETECTED   MDMA (Ecstasy)Ur Screen NONE DETECTED NONE DETECTED   Cocaine Metabolite,Ur Rea NONE DETECTED NONE DETECTED   Opiate, Ur Screen NONE DETECTED NONE DETECTED   Phencyclidine (PCP) Ur S NONE DETECTED NONE DETECTED   Cannabinoid 50 Ng, Ur [place] NONE DETECTED NONE DETECTED   Barbiturates, Ur Screen POSITIVE (A) NONE DETECTED   Benzodiazepine, Ur Scrn NONE DETECTED NONE DETECTED   Methadone Scn, Ur NONE DETECTED NONE DETECTED    Comment: (NOTE) Tricyclics + metabolites, urine    Cutoff 1000 ng/mL Amphetamines + metabolites, urine  Cutoff 1000 ng/mL MDMA (Ecstasy), urine              Cutoff 500 ng/mL Cocaine Metabolite, urine          Cutoff 300 ng/mL Opiate + metabolites, urine        Cutoff 300 ng/mL Phencyclidine (PCP), urine         Cutoff 25 ng/mL Cannabinoid, urine                 Cutoff 50 ng/mL Barbiturates + metabolites, urine  Cutoff 200 ng/mL Benzodiazepine, urine              Cutoff 200 ng/mL Methadone, urine                   Cutoff 300 ng/mL The urine drug screen provides only a preliminary, unconfirmed analytical test result and should not be used for non-medical purposes. Clinical consideration and professional judgment should be applied to any positive drug screen result due to possible interfering substances. A more specific alternate chemical method must be used in  order to obtain a confirmed analytical result. Gas chromatography / mass spectrometry (GC/MS) is the preferred confirmat ory method. Performed at [facility], [address], [place], [place] [address]    Dg Chest 2 View  Result Date: 03/03/2017 CLINICAL DATA:  Weakness. EXAM: CHEST  2 VIEW COMPARISON:  Radiographs of May 26, 2012. FINDINGS: The heart size and mediastinal contours are within normal limits. Both lungs are clear. No pneumothorax or pleural effusion is noted. The visualized skeletal structures are unremarkable. IMPRESSION: No active cardiopulmonary disease. Electronically Signed   By: Marijo Conception, M.D.   On: 03/03/2017 20:45   Ct Head Wo Contrast  Result Date: 03/03/2017 CLINICAL DATA:  66 year old male with head trauma. EXAM: CT HEAD WITHOUT CONTRAST TECHNIQUE: Contiguous axial images were obtained from the base of the skull through the vertex without intravenous contrast. COMPARISON:  None. FINDINGS: Brain: The ventricles and sulci appropriate size for patient's age. Minimal periventricular and deep white matter chronic microvascular ischemic changes noted. There is no acute intracranial hemorrhage. No mass effect or midline shift. No extra-axial fluid collection. Vascular: No hyperdense vessel or unexpected calcification. Skull: Normal. Negative for fracture or focal lesion. Sinuses/Orbits: There is diffuse mucoperiosteal thickening of paranasal sinuses. No air-fluid levels. The mastoid air cells are clear. Other: None IMPRESSION: 1. No acute intracranial hemorrhage. 2. Mild chronic microvascular ischemic changes. Electronically Signed   By: Anner Crete M.D.   On: 03/03/2017 21:01    Review of Systems  Constitutional: Negative for chills and fever.  HENT: Negative for sore throat and tinnitus.   Eyes: Negative for blurred vision and redness.  Respiratory: Negative for cough and shortness of breath.   Cardiovascular: Negative for chest pain, palpitations,  orthopnea and PND.  Gastrointestinal: Negative for abdominal pain, diarrhea, nausea and vomiting.  Genitourinary: Negative for dysuria, frequency and urgency.  Musculoskeletal: Negative for falls, joint pain and myalgias.  Skin: Negative for rash.       No lesions  Neurological: Positive for weakness. Negative for speech change and focal weakness.  Endo/Heme/Allergies: Does not bruise/bleed easily.       No temperature intolerance  Psychiatric/Behavioral: Positive for depression and suicidal ideas.    Blood pressure (!) 164/93, pulse 89, temperature 99.5 F (37.5 C), temperature source Oral, resp. rate 19, height 6' 3"  (1.905 m), weight 99.8 kg (220 lb), SpO2 99 %. Physical Exam  Vitals reviewed. Constitutional: He is oriented to person, place, and time. He appears well-developed and well-nourished. No distress.  HENT:  Head: Normocephalic and atraumatic.  Mouth/Throat: Oropharynx is clear and moist.  Eyes: Conjunctivae and EOM are normal. Pupils are equal, round, and reactive to light. No scleral icterus.  Neck: Normal range of motion. Neck supple. No JVD present. No tracheal deviation present. No thyromegaly present.  Cardiovascular: Normal rate, regular rhythm and normal heart sounds. Exam reveals no gallop and no friction rub.  No murmur heard. Respiratory: Effort normal and breath sounds normal. No respiratory distress.  GI: Soft. Bowel sounds are normal. He exhibits no distension. There is no tenderness.  Genitourinary:  Genitourinary Comments: deferred  Musculoskeletal: Normal range of motion. He exhibits no edema.  Lymphadenopathy:    He has no cervical adenopathy.  Neurological: He is alert and oriented to person, place, and time. No cranial nerve deficit.  tremulous  Skin: Skin is warm and dry. No rash noted. No erythema.  Psychiatric: His behavior is normal. Judgment and thought content normal. He exhibits a depressed mood.     Assessment/Plan This is a 66 year old  male admitted for alcohol withdrawal. 1.  Alcohol withdrawal: Tremulous and tachycardic with loose bowels.  Hydrate with intravenous fluid.  Encourage oral intake.  CIWA scale with Ativan as needed. 2.  Hypertension: Uncontrolled; continue amlodipine,  hydrochlorothiazide and lisinopril.  Labetalol as needed. 3.  Hyponatremia: Hypovolemic.  Hydrate with normal saline.  Replete potassium as well. 4.  Transaminitis: Consistent with alcohol abuse 5.  DVT prophylaxis: Lovenox 6.  GI prophylaxis: None The patient is a full code.  Time spent on admission orders and patient care approximately 45 minutes  Harrie Foreman, MD 03/05/2017, 5:16 AM

## 2017-03-05 NOTE — ED Notes (Signed)
Pt has defecated and urinated on himself; bed linen changed; pt cleaned up and put in new scrubs.

## 2017-03-05 NOTE — ED Notes (Signed)
Pt had urinated and defecated on himself; pt was cleaned, placed in new scrubs and complete bed linen changed after cleaning the bed.

## 2017-03-05 NOTE — ED Notes (Signed)
Assisted pt to standing so that he may use the urinal - continue to monitor

## 2017-03-05 NOTE — ED Notes (Signed)
Pt asked to be helped to clean himself; pt had defecated and urinated on himself; pt cleaned; complete linen changed and pt was placed in new paper scrubs and briefs.

## 2017-03-05 NOTE — Care Management (Signed)
Patient admitted for active alcohol withdrawal.Receiving IVFs and is tachycardic.  Psych will be following

## 2017-03-05 NOTE — ED Notes (Signed)
Pt has soiled himself; complete bed linen change; pt was cleaned up and new briefs placed and new scrubs provided.

## 2017-03-05 NOTE — BH Assessment (Signed)
Patient has been accepted at [facility], pending medical clearance.  Accepting MD:  Dr. Roxy Cedar Call report:  [phone] Pt can arrive anytime after 8 am (03/05/2017)  Pt is voluntary.

## 2017-03-05 NOTE — ED Notes (Signed)
Patient observed lying in bed with eyes closed  Even, unlabored respirations observed   NAD pt appears to be sleeping  I will continue to monitor along with every 15 minute visual observations and ongoing security monitoring    

## 2017-03-05 NOTE — ED Notes (Signed)
Pt has soiled himself; pt cleaned up, new scrubs provided; complete bed linen changed.

## 2017-03-05 NOTE — BH Assessment (Signed)
Patient is being admitted to the medical floor.  TTS counselor [facility] to advise of change in patient's status.

## 2017-03-05 NOTE — Progress Notes (Signed)
Patient is admitted because of alcohol withdrawal symptoms, found to have diarrhea.  Patient C. difficile positive.  No withdrawal symptoms at this time.  Has leg pain, cramps.  Having loose at least 8 times this morning. Reviewed Alert, awake, oriented.  No tremors observed in hands. Cardiovascular S1, S2 regular. Lungs: Clear to auscultation. Abdomen soft, nontender, nondistended. Lab data reviewed. Assessment and plan: #1 alcohol withdrawal: Patient is not requiring an y ativan, #2 C. difficile colitis.  Stool culture sources of antigen.  Started on vancomycin, continue isolation. 3.  Hypokalemia due to GI losses.  Continue potassium supplements. 4.  Supposed to see per psychiatrist butis not followed by them patient has history of for suicidal ideation.

## 2017-03-06 DIAGNOSIS — F1994 Other psychoactive substance use, unspecified with psychoactive substance-induced mood disorder: Secondary | ICD-10-CM

## 2017-03-06 DIAGNOSIS — F1010 Alcohol abuse, uncomplicated: Secondary | ICD-10-CM

## 2017-03-06 LAB — BASIC METABOLIC PANEL
Anion gap: 6 (ref 5–15)
BUN: 9 mg/dL (ref 6–20)
CALCIUM: 8 mg/dL — AB (ref 8.9–10.3)
CO2: 27 mmol/L (ref 22–32)
CREATININE: 0.78 mg/dL (ref 0.61–1.24)
Chloride: 98 mmol/L — ABNORMAL LOW (ref 101–111)
GFR calc Af Amer: >60 mL/min (ref >60)
Glucose, Bld: 120 mg/dL — ABNORMAL HIGH (ref 65–99)
POTASSIUM: 2.8 mmol/L — AB (ref 3.5–5.1)
SODIUM: 131 mmol/L — AB (ref 135–145)

## 2017-03-06 LAB — PHOSPHORUS: PHOSPHORUS: 1.3 mg/dL — AB (ref 2.5–4.6)

## 2017-03-06 LAB — MAGNESIUM: MAGNESIUM: 1.4 mg/dL — AB (ref 1.7–2.4)

## 2017-03-06 LAB — TSH: TSH: 2.87 u[IU]/mL (ref 0.350–4.500)

## 2017-03-06 MED ORDER — VITAMIN C 500 MG PO TABS
500.0000 mg | ORAL_TABLET | Freq: Every day | ORAL | Status: DC
  Administered 2017-03-06 – 2017-03-10 (×5): 500 mg via ORAL
  Filled 2017-03-06 (×6): qty 1

## 2017-03-06 MED ORDER — MAGNESIUM SULFATE 2 GM/50ML IV SOLN
2.0000 g | Freq: Once | INTRAVENOUS | Status: AC
  Administered 2017-03-06: 2 g via INTRAVENOUS
  Filled 2017-03-06: qty 50

## 2017-03-06 MED ORDER — POTASSIUM CHLORIDE CRYS ER 20 MEQ PO TBCR
40.0000 meq | EXTENDED_RELEASE_TABLET | Freq: Two times a day (BID) | ORAL | Status: AC
  Administered 2017-03-06 (×2): 40 meq via ORAL
  Filled 2017-03-06 (×2): qty 2

## 2017-03-06 MED ORDER — ENSURE ENLIVE PO LIQD
237.0000 mL | Freq: Two times a day (BID) | ORAL | Status: DC
  Administered 2017-03-06 (×2): 237 mL via ORAL

## 2017-03-06 NOTE — Consult Note (Signed)
[facility] Consult   Reason for Consult: Consult for 66 year old man with history of alcohol abuse currently in the hospital for alcohol withdrawal Referring Physician:  Vianne Bulls Patient Identification: John Booker MRN:  [id number] Principal Diagnosis: Alcohol abuse Diagnosis:   Patient Active Problem List   Diagnosis Date Noted  . Alcohol abuse [F10.10] 03/06/2017  . Substance induced mood disorder ([place]) [F19.94] 03/06/2017  . Alcohol withdrawal ([place]) [F10.239] 03/05/2017  . Abnormal glucose [R73.09] 12/20/2015  . Peri-rectal abscess [K61.1] 11/19/2015  . Perineal abscess [L02.215] 11/15/2015  . HTN (hypertension) [I10] 11/15/2015  . Vitamin D deficiency [E55.9] 11/15/2015    Total Time spent with patient: 1 hour  Subjective:   John Booker is a 66 y.o. male patient admitted with "I drank too much".  HPI: Patient interviewed, chart reviewed.  66 year old man came to the emergency room in the company of his daughter.  He had been found at his house intoxicated reportedly with a gun next to him.  Patient had been denying suicidal ideation since he came into the hospital.  Was admitted to the medical service for alcohol withdrawal symptoms.  Patient was cooperative with the interview.  He tells me that he drinks daily approximately half of a 750 mL bottle of liquor per day.  Has been doing that for months or more.  He claims that he still eats okay and sleeps okay.  Denies that he feels depressed.  Totally denies any suicidal he says that he decided on his own that he wants to stop drinking because it is just getting to be too much for him.  Patient today denies any hallucinations.  He still however is feeling weak and appears to be having frequent diarrhea.  Patient is not receiving any outpatient psychiatric treatment.  Social history: Lives by himself.  Does have adult daughters that he maintains relationship with some of whom accompanied to the hospital.  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  Medical history: History of high blood pressure but otherwise denies or minimizes any chronic medical problems.  Substance abuse history: Patient indicates problems with alcohol going back many years.  He claims that he had been sober for several years at a time but it is been at least 5 years since that was the case.  He denies that he uses any other drugs.  He denies any history of withdrawal seizures or delirium tremens.  Past Psychiatric History: No history of past psychiatric treatment no history of psychiatric medicine.  Denies any history of suicide attempts or violence or psychosis.  Does not think he is ever been involved in any formal substance abuse treatment  Risk to Self: Suicidal Ideation: Yes-Currently Present Suicidal Intent: No Is patient at risk for suicide?: No Suicidal Plan?: No Access to Means: No What has been your use of drugs/alcohol within the last 12 months?: Pt reports occasional alcohol use Other Self Harm Risks: non identified Triggers for Past Attempts: None known(None reported) Intentional Self Injurious Behavior: None Risk to Others: Homicidal Ideation: No Thoughts of Harm to Others: No Current Homicidal Intent: No Current Homicidal Plan: No Access to Homicidal Means: No Identified Victim: none identified History of harm to others?: No Assessment of Violence: None Noted Does patient have access to weapons?: Yes (Comment) Criminal Charges Pending?: No Does patient have a court date: No Prior Inpatient Therapy: Prior Inpatient Therapy: No Prior Therapy Dates: n/a Prior Therapy Facilty/Provider(s): n/a Reason for Treatment:  n/a Prior Outpatient Therapy: Prior Outpatient Therapy: No Prior Therapy Dates: n/a Prior Therapy Facilty/Provider(s): n/a Reason for Treatment: n/a  Past Medical History:  Past Medical History:  Diagnosis Date  . Hypertension     Past  Surgical History:  Procedure Laterality Date  . INCISION AND DRAINAGE PERIRECTAL ABSCESS N/A 11/19/2015   Procedure: IRRIGATION AND DEBRIDEMENT PERIRECTAL ABSCESS and debridement of scrotal abscess;  Surgeon: Florene Glen, MD;  Location: [facility] ORS;  Service: General;  Laterality: N/A;  . INCISION AND DRAINAGE PERIRECTAL ABSCESS N/A 11/20/2015   Procedure: IRRIGATION AND DEBRIDEMENT PERIRECTAL ABSCESS / WITH DRESSING CHANGE;  Surgeon: Florene Glen, MD;  Location: [facility] ORS;  Service: General;  Laterality: N/A;  peri rectal site  . INCISION AND DRAINAGE PERIRECTAL ABSCESS N/A 11/21/2015   Procedure: IRRIGATION AND DEBRIDEMENT PERIRECTAL ABSCESS WITH DRESSING CHANGE;  Surgeon: Florene Glen, MD;  Location: [facility] ORS;  Service: General;  Laterality: N/A;  . MINOR HEMORRHOIDECTOMY  1989   Family History:  Family History  Problem Relation Age of Onset  . Hypertension Father    Family Psychiatric  History: Denies any family history Social History:  Social History   Substance and Sexual Activity  Alcohol Use No     Social History   Substance and Sexual Activity  Drug Use No    Social History   Socioeconomic History  . Marital status: Legally Separated    Spouse name: None  . Number of children: None  . Years of education: None  . Highest education level: None  Social Needs  . Financial resource strain: None  . Food insecurity - worry: None  . Food insecurity - inability: None  . Transportation needs - medical: None  . Transportation needs - non-medical: None  Occupational History  . None  Tobacco Use  . Smoking status: Former Smoker    Packs/day: 0.50    Years: 38.00    Pack years: 19.00    Types: Cigarettes  . Smokeless tobacco: Never Used  Substance and Sexual Activity  . Alcohol use: No  . Drug use: No  . Sexual activity: None  Other Topics Concern  . None  Social History Narrative  . None   Additional Social History:    Allergies:   Allergies  Allergen  Reactions  . Adalat [Nifedipine]     Foot swelling.     Labs:  Results for orders placed or performed during the hospital encounter of 03/04/17 (from the past 48 hour(s))  CBC     Status: Abnormal   Collection Time: 03/04/17  7:46 PM  Result Value Ref Range   WBC 5.8 3.8 - 10.6 K/uL   RBC 4.31 (L) 4.40 - 5.90 MIL/uL   Hemoglobin 14.8 13.0 - 18.0 g/dL   HCT 43.1 40.0 - 52.0 %   MCV 100.0 80.0 - 100.0 fL   MCH 34.4 (H) 26.0 - 34.0 pg   MCHC 34.4 32.0 - 36.0 g/dL   RDW 14.1 11.5 - 14.5 %   Platelets 136 (L) 150 - 440 K/uL    Comment: Performed at [facility], [address] [place], [place] [address]  Comprehensive metabolic panel     Status: Abnormal   Collection Time: 03/04/17  7:46 PM  Result Value Ref Range   Sodium 134 (L) 135 - 145 mmol/L   Potassium 3.2 (L) 3.5 - 5.1 mmol/L   Chloride 92 (L) 101 - 111 mmol/L   CO2 30 22 - 32 mmol/L   Glucose,  Bld 132 (H) 65 - 99 mg/dL   BUN 18 6 - 20 mg/dL   Creatinine, Ser 1.11 0.61 - 1.24 mg/dL   Calcium 8.6 (L) 8.9 - 10.3 mg/dL   Total Protein 7.1 6.5 - 8.1 g/dL   Albumin 4.3 3.5 - 5.0 g/dL   AST 128 (H) 15 - 41 U/L   ALT 75 (H) 17 - 63 U/L   Alkaline Phosphatase 120 38 - 126 U/L   Total Bilirubin 2.4 (H) 0.3 - 1.2 mg/dL   GFR calc non Af Amer >60 >60 mL/min   GFR calc Af Amer >60 >60 mL/min    Comment: (NOTE) The eGFR has been calculated using the CKD EPI equation. This calculation has not been validated in all clinical situations. eGFR's persistently <60 mL/min signify possible Chronic Kidney Disease.    Anion gap 12 5 - 15    Comment: Performed at [facility], [address] [place], [place] [address]  Ethanol     Status: None   Collection Time: 03/04/17  7:46 PM  Result Value Ref Range   Alcohol, Ethyl (B) <10 <10 mg/dL    Comment:        LOWEST DETECTABLE LIMIT FOR SERUM ALCOHOL IS 10 mg/dL FOR MEDICAL PURPOSES ONLY Performed at [facility], [address], [place], [place]  [address]   Urine Drug Screen, Qualitative ([place] only)     Status: Abnormal   Collection Time: 03/04/17  7:46 PM  Result Value Ref Range   Tricyclic, Ur Screen NONE DETECTED NONE DETECTED   Amphetamines, Ur Screen NONE DETECTED NONE DETECTED   MDMA (Ecstasy)Ur Screen NONE DETECTED NONE DETECTED   Cocaine Metabolite,Ur [place] NONE DETECTED NONE DETECTED   Opiate, Ur Screen NONE DETECTED NONE DETECTED   Phencyclidine (PCP) Ur S NONE DETECTED NONE DETECTED   Cannabinoid 50 Ng, Ur [place] NONE DETECTED NONE DETECTED   Barbiturates, Ur Screen POSITIVE (A) NONE DETECTED   Benzodiazepine, Ur Scrn NONE DETECTED NONE DETECTED   Methadone Scn, Ur NONE DETECTED NONE DETECTED    Comment: (NOTE) Tricyclics + metabolites, urine    Cutoff 1000 ng/mL Amphetamines + metabolites, urine  Cutoff 1000 ng/mL MDMA (Ecstasy), urine              Cutoff 500 ng/mL Cocaine Metabolite, urine          Cutoff 300 ng/mL Opiate + metabolites, urine        Cutoff 300 ng/mL Phencyclidine (PCP), urine         Cutoff 25 ng/mL Cannabinoid, urine                 Cutoff 50 ng/mL Barbiturates + metabolites, urine  Cutoff 200 ng/mL Benzodiazepine, urine              Cutoff 200 ng/mL Methadone, urine                   Cutoff 300 ng/mL The urine drug screen provides only a preliminary, unconfirmed analytical test result and should not be used for non-medical purposes. Clinical consideration and professional judgment should be applied to any positive drug screen result due to possible interfering substances. A more specific alternate chemical method must be used in order to obtain a confirmed analytical result. Gas chromatography / mass spectrometry (GC/MS) is the preferred confirmat ory method. Performed at [facility], [address] [place], [place] [address]   C difficile quick scan w PCR reflex     Status:  Abnormal   Collection Time: 03/05/17 10:23 AM  Result Value Ref Range   C Diff antigen POSITIVE (A) NEGATIVE    C Diff toxin NEGATIVE NEGATIVE   C Diff interpretation Results are indeterminate. See PCR results.     Comment: Performed at [facility], [address] [place], [place] [address]  C. Diff by PCR, Reflexed     Status: Abnormal   Collection Time: 03/05/17 10:23 AM  Result Value Ref Range   Toxigenic C. Difficile by PCR POSITIVE (A) NEGATIVE    Comment: Positive for toxigenic C. difficile with little to no toxin production. Only treat if clinical presentation suggests symptomatic illness. Performed at [facility], [address] [place], [place] [address]   TSH     Status: None   Collection Time: 03/06/17 11:55 AM  Result Value Ref Range   TSH 2.870 0.350 - 4.500 uIU/mL    Comment: Performed by a 3rd Generation assay with a functional sensitivity of <=0.01 uIU/mL. Performed at [facility], [address] [place], [place] [address]   Magnesium     Status: Abnormal   Collection Time: 03/06/17 11:55 AM  Result Value Ref Range   Magnesium 1.4 (L) 1.7 - 2.4 mg/dL    Comment: Performed at [facility], [address] [place], [place] [address]  Basic metabolic panel     Status: Abnormal   Collection Time: 03/06/17 11:55 AM  Result Value Ref Range   Sodium 131 (L) 135 - 145 mmol/L   Potassium 2.8 (L) 3.5 - 5.1 mmol/L   Chloride 98 (L) 101 - 111 mmol/L   CO2 27 22 - 32 mmol/L   Glucose, Bld 120 (H) 65 - 99 mg/dL   BUN 9 6 - 20 mg/dL   Creatinine, Ser 0.78 0.61 - 1.24 mg/dL   Calcium 8.0 (L) 8.9 - 10.3 mg/dL   GFR calc non Af Amer >60 >60 mL/min   GFR calc Af Amer >60 >60 mL/min    Comment: (NOTE) The eGFR has been calculated using the CKD EPI equation. This calculation has not been validated in all clinical situations. eGFR's persistently <60 mL/min signify possible Chronic Kidney Disease.    Anion gap 6 5 - 15    Comment: Performed at [facility], [address] [place], [place] [address]  Phosphorus     Status: Abnormal    Collection Time: 03/06/17 12:16 PM  Result Value Ref Range   Phosphorus 1.3 (L) 2.5 - 4.6 mg/dL    Comment: Performed at [facility], [address] [place], [place] [address]    Current Facility-Administered Medications  Medication Dose Route Frequency Provider Last Rate Last Dose  . acetaminophen (TYLENOL) tablet 650 mg  650 mg Oral Q6H PRN Harrie Foreman, MD       Or  . acetaminophen (TYLENOL) suppository 650 mg  650 mg Rectal Q6H PRN Harrie Foreman, MD      . amLODipine (NORVASC) tablet 10 mg  10 mg Oral Daily Harrie Foreman, MD   10 mg at 03/06/17 0919  . chlorproMAZINE (THORAZINE) 12.5 mg in sodium chloride 0.9 % 25 mL IVPB  12.5 mg Intravenous Q6H PRN Epifanio Lesches, MD   Stopped at 03/05/17 1515  . cloNIDine (CATAPRES) tablet 0.2 mg  0.2 mg Oral Daily Epifanio Lesches, MD   0.2 mg at 03/06/17 0920  . enoxaparin (LOVENOX) injection 40 mg  40 mg Subcutaneous Q24H Harrie Foreman, MD  40 mg at 03/05/17 2215  . feeding supplement (ENSURE ENLIVE) (ENSURE ENLIVE) liquid 237 mL  237 mL Oral BID BM Epifanio Lesches, MD   237 mL at 03/06/17 1351  . folic acid (FOLVITE) tablet 1 mg  1 mg Oral Daily Harrie Foreman, MD   1 mg at 03/06/17 0920  . lisinopril (PRINIVIL,ZESTRIL) tablet 20 mg  20 mg Oral Daily Harrie Foreman, MD   20 mg at 03/06/17 0919  . LORazepam (ATIVAN) injection 0-4 mg  0-4 mg Intravenous Q6H Harrie Foreman, MD       Followed by  . [START ON 03/07/2017] LORazepam (ATIVAN) injection 0-4 mg  0-4 mg Intravenous Q12H Harrie Foreman, MD      . LORazepam (ATIVAN) tablet 1 mg  1 mg Oral Q6H PRN Harrie Foreman, MD       Or  . LORazepam (ATIVAN) injection 1 mg  1 mg Intravenous Q6H PRN Harrie Foreman, MD      . multivitamin with minerals tablet 1 tablet  1 tablet Oral Daily Harrie Foreman, MD   1 tablet at 03/06/17 0919  . ondansetron (ZOFRAN) tablet 4 mg  4 mg Oral Q6H PRN Harrie Foreman, MD       Or  .  ondansetron [facility]) injection 4 mg  4 mg Intravenous Q6H PRN Harrie Foreman, MD      . potassium chloride SA (K-DUR,KLOR-CON) CR tablet 40 mEq  40 mEq Oral BID Epifanio Lesches, MD   40 mEq at 03/06/17 1609  . thiamine (VITAMIN B-1) tablet 100 mg  100 mg Oral Daily Harrie Foreman, MD   100 mg at 03/06/17 0923   Or  . thiamine (B-1) injection 100 mg  100 mg Intravenous Daily Harrie Foreman, MD      . vancomycin (VANCOCIN) 50 mg/mL oral solution 125 mg  125 mg Oral Q6H Epifanio Lesches, MD   125 mg at 03/06/17 1744  . vitamin C (ASCORBIC ACID) tablet 500 mg  500 mg Oral Daily Epifanio Lesches, MD   500 mg at 03/06/17 1520    Musculoskeletal: Strength & Muscle Tone: decreased Gait & Station: unable to stand Patient leans: Front  Psychiatric Specialty Exam: Physical Exam  Nursing note and vitals reviewed. Constitutional: He appears well-developed and well-nourished.  HENT:  Head: Normocephalic and atraumatic.  Eyes: Conjunctivae are normal. Pupils are equal, round, and reactive to light.  Neck: Normal range of motion.  Cardiovascular: Regular rhythm and normal heart sounds.  Respiratory: Effort normal. No respiratory distress.  GI: Soft.  Musculoskeletal: Normal range of motion.  Neurological: He is alert.  Skin: Skin is warm and dry.  Psychiatric: Judgment normal. His affect is blunt. His speech is delayed. He is slowed. Thought content is not paranoid. Cognition and memory are impaired. He expresses no homicidal and no suicidal ideation.    Review of Systems  Constitutional: Positive for malaise/fatigue.  HENT: Negative.   Eyes: Negative.   Respiratory: Negative.   Cardiovascular: Negative.   Gastrointestinal: Positive for diarrhea.  Musculoskeletal: Negative.   Skin: Negative.   Neurological: Positive for weakness.  Psychiatric/Behavioral: Positive for memory loss and substance abuse. Negative for depression, hallucinations and suicidal ideas. The  patient is nervous/anxious and has insomnia.     Blood pressure 126/64, pulse 83, temperature 97.9 F (36.6 C), resp. rate 16, height _0  (1.905 m), weight 192 lb (87.1 kg), SpO2 100 %.Body mass index is 24 kg/m.  General Appearance:  Disheveled  Eye Contact:  Fair  Speech:  Slow  Volume:  Decreased  Mood:  Euthymic  Affect:  Constricted  Thought Process:  Goal Directed  Orientation:  Full (Time, Place, and Person)  Thought Content:  Logical  Suicidal Thoughts:  No  Homicidal Thoughts:  No  Memory:  Immediate;   Fair Recent;   Fair Remote;   Fair  Judgement:  Fair  Insight:  Fair  Psychomotor Activity:  Normal  Concentration:  Concentration: Fair  Recall:  [employer] of Knowledge:  Fair  Language:  Fair  Akathisia:  No  Handed:  Right  AIMS (if indicated):     Assets:  Desire for Improvement Housing Resilience  ADL's:  Impaired  Cognition:  WNL  Sleep:        Treatment Plan Summary: Medication management and Plan 66 year old man with a history of alcohol abuse.  Patient has consistently denied any suicidal ideation and does not appear to be depressed.  There is no evidence that he was definitely trying to hurt himself although the presence of a gun was worrying while intoxicated.  In any case he no longer meets commitment criteria.  Discontinue the sitter as no longer necessary.  Patient is still showing a great deal of weakness.  Needs help even to stand up and pivot to the toilet.  Still requiring detox medication.  No sign however at this point of any delirium.  When he was in the emergency room plans had initially been to admit him to the psychiatric service although at this point I do not think that is going to be necessary.  The patient is requesting going to some kind of inpatient substance abuse treatment on discharge.  I am going to put in a social work consult to see if they can talk to him about what is possible.  I am not sure what his benefits are.  It looks like  however he probably does have insurance.  I will sign this out to the doctor over the weekend.  No need to change any medication at this point.  Disposition: No evidence of imminent risk to self or others at present.   Patient does not meet criteria for psychiatric inpatient admission. Supportive therapy provided about ongoing stressors.  Alethia Berthold, MD 03/06/2017 5:54 PM

## 2017-03-06 NOTE — Progress Notes (Signed)
[place] [facility] [place] NAME: John Booker    MR#:  [id number]  DATE OF BIRTH:  [birth year]  SUBJECTIVE: Patient is here for diarrhea, found to have C. difficile colitis.  And today he says diarrhea is decreased, tolerating the diet.  No withdrawal symptoms.  Denies any suicidal ideation.  Patient has sitter because of his suicidal ideation when he came, psychiatric consult is pending.  CHIEF COMPLAINT:   Chief Complaint  Patient presents with  . Alcohol Problem    REVIEW OF SYSTEMS:   ROS CONSTITUTIONAL: No fever, fatigue or weakness.  EYES: No blurred or double vision.  EARS, NOSE, AND THROAT: No tinnitus or ear pain.  RESPIRATORY: No cough, shortness of breath, wheezing or hemoptysis.  CARDIOVASCULAR: No chest pain, orthopnea, edema.  GASTROINTESTINAL: No nausea, vomiting, diarrhea or abdominal pain.  GENITOURINARY: No dysuria, hematuria.  ENDOCRINE: No polyuria, nocturia,  HEMATOLOGY: No anemia, easy bruising or bleeding SKIN: No rash or lesion. MUSCULOSKELETAL: No joint pain or arthritis.   NEUROLOGIC: No tingling, numbness, weakness.  PSYCHIATRY: No anxiety or depression.  He says he is feels really weak and unstable when he walks. DRUG ALLERGIES:   Allergies  Allergen Reactions  . Adalat [Nifedipine]     Foot swelling.     VITALS:  Blood pressure 126/64, pulse 83, temperature 97.9 F (36.6 C), resp. rate 16, height 6\' 3"  (1.905 m), weight 87.1 kg (192 lb), SpO2 100 %.  PHYSICAL EXAMINATION:  GENERAL:  66 y.o.-year-old patient lying in the bed with no acute distress.  EYES: Pupils equal, round, reactive to light . No scleral icterus. Extraocular muscles intact.  HEENT: Head atraumatic, normocephalic. Oropharynx and nasopharynx clear.  NECK:  Supple, no jugular venous distention. No thyroid enlargement, no tenderness.  LUNGS: Normal breath sounds bilaterally, no wheezing, rales,rhonchi or crepitation. No use of  accessory muscles of respiration.  CARDIOVASCULAR: S1, S2 normal. No murmurs, rubs, or gallops.  ABDOMEN: Soft, nontender, nondistended. Bowel sounds present. No organomegaly or mass.  EXTREMITIES: No pedal edema, cyanosis, or clubbing.  NEUROLOGIC: Cranial nerves II through XII are intact. Muscle strength 5/5 in all extremities. Sensation intact. Gait not checked.  PSYCHIATRIC: The patient is alert and oriented x 3.  SKIN: No obvious rash, lesion, or ulcer.    LABORATORY PANEL:   CBC Recent Labs  Lab 03/04/17 1946  WBC 5.8  HGB 14.8  HCT 43.1  PLT 136*   ------------------------------------------------------------------------------------------------------------------  Chemistries  Recent Labs  Lab 03/04/17 1946 03/06/17 1155  NA 134* 131*  K 3.2* 2.8*  CL 92* 98*  CO2 30 27  GLUCOSE 132* 120*  BUN 18 9  CREATININE 1.11 0.78  CALCIUM 8.6* 8.0*  MG  --  1.4*  AST 128*  --   ALT 75*  --   ALKPHOS 120  --   BILITOT 2.4*  --    ------------------------------------------------------------------------------------------------------------------  Cardiac Enzymes No results for input(s): TROPONINI in the last 168 hours. ------------------------------------------------------------------------------------------------------------------  RADIOLOGY:  No results found.  EKG:   Orders placed or performed during the hospital encounter of 03/03/17  . EKG 12-Lead  . EKG 12-Lead  . EKG    ASSESSMENT AND PLAN:   C. difficile colitis: Continue vancomycin, continue enteric precautions: Diarrhea decreased, add probiotics. 2.  Generalized weakness secondary to diarrhea, with electrolyte imbalance iarrhea decreased about patient has hypokalemia, hypomagnesemia: Replace the potassium, magnesium, physical therapy consult. #3 .suicidal ideation: Sitter at bedside, denies suicidal ideation now, patient  had suicidal ideation when he came, admitted to medicine for alcohol withdrawal  symptoms, patient does not have any withdrawal symptoms now not needing any Ativan.  Psychiatric consult is pending.  Discussed with nurse.  All the records are reviewed and case discussed with Care Management/Social Workerr. Management plans discussed with the patient, family and they are in agreement.  CODE STATUS: Full code  TOTAL TIME TAKING CARE OF THIS PATIENT: 35 minutes.   POSSIBLE D/C IN 1-2 DAYS, DEPENDING ON CLINICAL CONDITION.   Epifanio Lesches M.D on 03/06/2017 at 3:29 PM  Between 7am to 6pm - Pager - [phone]  After 6pm go to [email or web address] - password [email or web address] [facility]  Office  [phone]  CC: Primary care physician; Olin Hauser, DO   Note: This dictation was prepared with Dragon dictation along with smaller phrase technology. Any transcriptional errors that result from this process are unintentional.

## 2017-03-06 NOTE — Progress Notes (Signed)
Patient has been very stoic all morning and also appears to be in a very bad mood, among other things. Apparently this isn't new. Will continue to monitor patient's affect. Wenda Low [facility]

## 2017-03-06 NOTE — Progress Notes (Signed)
Initial Nutrition Assessment  DOCUMENTATION CODES:   Not applicable  INTERVENTION:   Ensure Enlive po BID, each supplement provides 350 kcal and 20 grams of protein  MVI daily   Thiamine 100mg  daily  Folic acid 1mg  daily   Recommend vitamin C 500 mg daily   Liberalize diet   NUTRITION DIAGNOSIS:   Increased nutrient needs related to social / environmental circumstances(etoh abuse ) as evidenced by increased estimated needs from protein.  GOAL:   Patient will meet greater than or equal to 90% of their needs  MONITOR:   PO intake, Supplement acceptance, Labs, Weight trends, I & O's  REASON FOR ASSESSMENT:   Malnutrition Screening Tool    ASSESSMENT:   66 y.o. male with history of alcohol abuse and hypertension presents to the emergency department with request for alcohol detox.    Did not see pt today. Pt noted to be in a bad mood and very stoic. Will add Ensure and liberalize diet. Pt on CIWA; already ordered for multivitamin, folic acid, and thiamine. RD will obtain nutrition related history and exam at follow-up. Per chart, pt appears to be weight stable for the past year. Pt's weight from today is significantly less than pt's UBW; unsure if this weight is correct. Pt likely at refeeding risk; recommend check Mg and P with morning labs. Pt with hypokalemia today; this is being supplemented.      Medications reviewed and include: lovenox, folic acid, MVI, thiamine, vancomycin, NaCl @125ml /hr  Labs reviewed: Na 134(L), K 3.2(L), Cl 92(L), Ca 8.6(L), AST 128(H), ALT 75(H), t bili 2.4(H)   Unable to complete Nutrition-Focused physical exam at this time.   Diet Order:  Diet Heart Room service appropriate? Yes; Fluid consistency: Thin  EDUCATION NEEDS:   Not appropriate for education at this time  Skin:  Reviewed RN Assessment  Last BM:  12/27- type 6   Height:   Ht Readings from Last 1 Encounters:  03/04/17 6\' 3"  (1.905 m)    Weight:   Wt Readings from  Last 1 Encounters:  03/06/17 192 lb (87.1 kg)    Ideal Body Weight:  89 kg  BMI:  Body mass index is 24 kg/m.  Estimated Nutritional Needs:   Kcal:  2100-2400kcal/day   Protein:  96-113g/day   Fluid:  >2L/day   Koleen Distance MS, RD, LDN Pager #[phone] After Hours Pager: [phone]

## 2017-03-07 LAB — BASIC METABOLIC PANEL
Anion gap: 6 (ref 5–15)
BUN: 11 mg/dL (ref 6–20)
CALCIUM: 8.9 mg/dL (ref 8.9–10.3)
CO2: 27 mmol/L (ref 22–32)
CREATININE: 0.66 mg/dL (ref 0.61–1.24)
Chloride: 100 mmol/L — ABNORMAL LOW (ref 101–111)
GFR calc Af Amer: >60 mL/min (ref >60)
Glucose, Bld: 96 mg/dL (ref 65–99)
Potassium: 3.4 mmol/L — ABNORMAL LOW (ref 3.5–5.1)
SODIUM: 133 mmol/L — AB (ref 135–145)

## 2017-03-07 LAB — MAGNESIUM: MAGNESIUM: 1.8 mg/dL (ref 1.7–2.4)

## 2017-03-07 MED ORDER — MAGNESIUM SULFATE 2 GM/50ML IV SOLN
2.0000 g | Freq: Once | INTRAVENOUS | Status: AC
  Administered 2017-03-07: 2 g via INTRAVENOUS
  Filled 2017-03-07: qty 50

## 2017-03-07 MED ORDER — SODIUM CHLORIDE 0.9% FLUSH
3.0000 mL | Freq: Two times a day (BID) | INTRAVENOUS | Status: DC
  Administered 2017-03-07 – 2017-03-10 (×7): 3 mL via INTRAVENOUS

## 2017-03-07 MED ORDER — PNEUMOCOCCAL VAC POLYVALENT 25 MCG/0.5ML IJ INJ
0.5000 mL | INJECTION | INTRAMUSCULAR | Status: AC
  Administered 2017-03-10: 0.5 mL via INTRAMUSCULAR
  Filled 2017-03-07: qty 0.5

## 2017-03-07 NOTE — Evaluation (Signed)
Physical Therapy Evaluation Patient Details Name: BRALIN GARRY MRN: [id number] DOB: [birth year] Today's Date: 03/07/2017   History of Present Illness  Pt is a 66 y.o. male presenting to ED for alcohol detox (pt found intoxicated and sitting on couch with gun beside him); of note pt with recent ED visit with weakness and alcohol intoxication.  Pt admitted with alcohol withdrawal, diarrhea (positive for c-diff), and hypokalemia.  PMH includes htn and alcohol abuse.  Clinical Impression  Prior to hospital admission, pt was independent with functional mobility.  Pt lives alone in 1 level home with 5 steps to enter.  Currently pt is independent supine to sit and CGA to min assist with transfers and ambulating 80 feet with RW.  Overall pt demonstrating "shakiness" with functional mobility causing balance challenges for pt (d/t this pt requiring assist for balance and safety); pt also noted with generalized weakness and decreased activity tolerance.  Pt would benefit from skilled PT to address noted impairments and functional limitations (see below for any additional details).  Upon hospital discharge, currently recommend pt discharge to STR but will continue to assess pt's progress during hospitalization and update discharge recommendations as appropriate.    Follow Up Recommendations SNF    Equipment Recommendations  Rolling walker with 5" wheels    Recommendations for Other Services       Precautions / Restrictions Precautions Precautions: Fall Restrictions Weight Bearing Restrictions: No      Mobility  Bed Mobility Overal bed mobility: Independent             General bed mobility comments: No physical assist or cues required supine to sit  Transfers Overall transfer level: Needs assistance Equipment used: Rolling walker (2 wheeled) Transfers: Sit to/from [employer] Transfers Sit to Stand: Min guard;Min assist Stand pivot transfers: Min guard;Min assist(bed to recliner  with RW)       General transfer comment: pt "shaky" standing requiring CGA to min assist to steady; vc's for hand placement and walker use required  Ambulation/Gait Ambulation/Gait assistance: Min guard;Min assist Ambulation Distance (Feet): 80 Feet Assistive device: Rolling walker (2 wheeled)   Gait velocity: decreased   General Gait Details: pt overall "shaky" with decreased B step length/foot clearance/heelstrike requiring CGA to occasional min assist to steady  Stairs            Wheelchair Mobility    Modified Rankin (Stroke Patients Only)       Balance Overall balance assessment: Needs assistance Sitting-balance support: Feet supported;No upper extremity supported Sitting balance-Leahy Scale: Good Sitting balance - Comments: "shakiness" noted sitting reaching outside BOS (close SBA for safety)   Standing balance support: No upper extremity supported Standing balance-Leahy Scale: Fair Standing balance comment: CGA static standing no UE support; pt unable to reach outside BOS d/t unsteadiness without UE support                             Pertinent Vitals/Pain Pain Assessment: No/denies pain  Vitals (HR and O2 on room air) stable and WFL throughout treatment session.    Home Living Family/patient expects to be discharged to:: Private residence Living Arrangements: Alone Available Help at Discharge: Family;Available PRN/intermittently Type of Home: House Home Access: Stairs to enter Entrance Stairs-Rails: Psychiatric nurse of Steps: 5 Home Layout: One level Home Equipment: Grab bars - tub/shower;Cane - single point      Prior Function Level of Independence: Independent  Comments: Pt independent with all ADLs/IADLs and ambulates without AD.  Pt reports 3 falls in the past 6 months (recent PT note documenting 1 fall in past 6 months).     Hand Dominance        Extremity/Trunk Assessment   Upper Extremity  Assessment Upper Extremity Assessment: Generalized weakness    Lower Extremity Assessment Lower Extremity Assessment: Generalized weakness    Cervical / Trunk Assessment Cervical / Trunk Assessment: Normal  Communication   Communication: No difficulties  Cognition Arousal/Alertness: Awake/alert Behavior During Therapy: Flat affect Overall Cognitive Status: Within Functional Limits for tasks assessed                                        General Comments General comments (skin integrity, edema, etc.): Pt resting in bed upon PT entry.  Nursing cleared pt for participation in physical therapy.  Pt agreeable to PT session.    Exercises     Assessment/Plan    PT Assessment Patient needs continued PT services  PT Problem List Decreased strength;Decreased activity tolerance;Decreased balance;Decreased mobility;Decreased knowledge of use of DME;Decreased knowledge of precautions       PT Treatment Interventions DME instruction;Gait training;Stair training;Functional mobility training;Therapeutic activities;Therapeutic exercise;Balance training;Patient/family education    PT Goals (Current goals can be found in the Care Plan section)  Acute Rehab PT Goals Patient Stated Goal: to get stronger PT Goal Formulation: With patient Time For Goal Achievement: 03/21/17 Potential to Achieve Goals: Good    Frequency Min 2X/week   Barriers to discharge Decreased caregiver support      Co-evaluation               AM-PAC PT "6 Clicks" Daily Activity  Outcome Measure Difficulty turning over in bed (including adjusting bedclothes, sheets and blankets)?: None Difficulty moving from lying on back to sitting on the side of the bed? : A Little Difficulty sitting down on and standing up from a chair with arms (e.g., wheelchair, bedside commode, etc,.)?: Unable Help needed moving to and from a bed to chair (including a wheelchair)?: A Little Help needed walking in  hospital room?: A Little Help needed climbing 3-5 steps with a railing? : A Little 6 Click Score: 17    End of Session Equipment Utilized During Treatment: Gait belt Activity Tolerance: Patient limited by fatigue Patient left: in chair;with call bell/phone within reach;with chair alarm set Nurse Communication: Mobility status;Precautions PT Visit Diagnosis: Unsteadiness on feet (R26.81);Other abnormalities of gait and mobility (R26.89);Muscle weakness (generalized) (M62.81);History of falling (Z91.81)    Time: 0100-7121 PT Time Calculation (min) (ACUTE ONLY): 23 min   Charges:   PT Evaluation $PT Eval Low Complexity: 1 Low     PT G Codes:   PT G-Codes **NOT FOR INPATIENT CLASS** Functional Assessment Tool Used: AM-PAC 6 Clicks Basic Mobility Functional Limitation: Mobility: Walking and moving around Mobility: Walking and Moving Around Current Status (F7588): At least 40 percent but less than 60 percent impaired, limited or restricted Mobility: Walking and Moving Around Goal Status [phone]): 0 percent impaired, limited or restricted    Leitha Bleak, PT 03/07/17, 5:24 PM [phone]

## 2017-03-07 NOTE — Progress Notes (Signed)
[place] at [place] NAME: John Booker    MR#:  [id number]  DATE OF BIRTH:  1950/11/08  SUBJECTIVE: Patient is here for diarrhea, found to have C. difficile colitis.  2 episodes of loose stool today, no abdominal pain or nausea.  Flat affect.  Wants to  go to have because he feels really weak when getting up.  CHIEF COMPLAINT:   Chief Complaint  Patient presents with  . Alcohol Problem    REVIEW OF SYSTEMS:   ROS CONSTITUTIONAL: No fever, fatigue or weakness.  EYES: No blurred or double vision.  EARS, NOSE, AND THROAT: No tinnitus or ear pain.  RESPIRATORY: No cough, shortness of breath, wheezing or hemoptysis.  CARDIOVASCULAR: No chest pain, orthopnea, edema.  GASTROINTESTINAL: No nausea, vomiting,.  2 episodes of loose stool this morning. GENITOURINARY: No dysuria, hematuria.  ENDOCRINE: No polyuria, nocturia,  HEMATOLOGY: No anemia, easy bruising or bleeding SKIN: No rash or lesion. MUSCULOSKELETAL: No joint pain or arthritis.   NEUROLOGIC: No tingling, numbness, weakness.  PSYCHIATRY: No anxiety or depression.  He says he is feels really weak and unstable when he walks. DRUG ALLERGIES:   Allergies  Allergen Reactions  . Adalat [Nifedipine]     Foot swelling.     VITALS:  Blood pressure 133/75, pulse 72, temperature 98.3 F (36.8 C), temperature source Oral, resp. rate 20, height 6\' 3"  (1.905 m), weight 87.9 kg (193 lb 12.8 oz), SpO2 100 %.  PHYSICAL EXAMINATION:  GENERAL:  66 y.o.-year-old patient lying in the bed with no acute distress.  EYES: Pupils equal, round, reactive to light . No scleral icterus. Extraocular muscles intact.  HEENT: Head atraumatic, normocephalic. Oropharynx and nasopharynx clear.  NECK:  Supple, no jugular venous distention. No thyroid enlargement, no tenderness.  LUNGS: Normal breath sounds bilaterally, no wheezing, rales,rhonchi or crepitation. No use of accessory muscles of respiration.   CARDIOVASCULAR: S1, S2 normal. No murmurs, rubs, or gallops.  ABDOMEN: Soft, nontender, nondistended. Bowel sounds present. No organomegaly or mass.  EXTREMITIES: No pedal edema, cyanosis, or clubbing.  NEUROLOGIC: Cranial nerves II through XII are intact. Muscle strength 5/5 in all extremities. Sensation intact. Gait not checked.  PSYCHIATRIC: The patient is alert and oriented x 3.  SKIN: No obvious rash, lesion, or ulcer.    LABORATORY PANEL:   CBC Recent Labs  Lab 03/04/17 1946  WBC 5.8  HGB 14.8  HCT 43.1  PLT 136*   ------------------------------------------------------------------------------------------------------------------  Chemistries  Recent Labs  Lab 03/04/17 1946  03/07/17 0630  NA 134*   < > 133*  K 3.2*   < > 3.4*  CL 92*   < > 100*  CO2 30   < > 27  GLUCOSE 132*   < > 96  BUN 18   < > 11  CREATININE 1.11   < > 0.66  CALCIUM 8.6*   < > 8.9  MG  --    < > 1.8  AST 128*  --   --   ALT 75*  --   --   ALKPHOS 120  --   --   BILITOT 2.4*  --   --    < > = values in this interval not displayed.   ------------------------------------------------------------------------------------------------------------------  Cardiac Enzymes No results for input(s): TROPONINI in the last 168 hours. ------------------------------------------------------------------------------------------------------------------  RADIOLOGY:  No results found.  EKG:   Orders placed or performed during the hospital encounter of 03/03/17  . EKG 12-Lead  .  EKG 12-Lead  . EKG    ASSESSMENT AND PLAN:   C. difficile colitis: Continue vancomycin, continue enteric precautions: Continue p.o. vancomycin.  2.  Generalized weakness secondary to diarrhea, with electrolyte imbalance  Hypokalemia, hypomagnesemia is being corrected Diarrhea severe it is decreasing    #3 .suicidal ideation: Seen by psychiatric, no further suicidal ideation, IVC discontinued.    #4 deconditioning:  Physical therapy consult placed.  Patient requesting to go to rehab and he feels really weak and does not want to go home and live by himself.  Discussed with nurse.  All the records are reviewed and case discussed with Care Management/Social Workerr. Management plans discussed with the patient, family and they are in agreement.  CODE STATUS: Full code  TOTAL TIME TAKING CARE OF THIS PATIENT: 35 minutes.   POSSIBLE D/C IN 1-2 DAYS, DEPENDING ON CLINICAL CONDITION.   Epifanio Lesches M.D on 03/07/2017 at 12:58 PM  Between 7am to 6pm - Pager - [phone]  After 6pm go to [email or web address] - password [email or web address] [facility]  Office  [phone]  CC: Primary care physician; Olin Hauser, DO   Note: This dictation was prepared with Dragon dictation along with smaller phrase technology. Any transcriptional errors that result from this process are unintentional.

## 2017-03-07 NOTE — Clinical Social Work Note (Signed)
CSW received consult for inpatient substance use treatment options. CSW will assess when able.  Santiago Bumpers, MSW, Latanya Presser [phone]

## 2017-03-08 LAB — POTASSIUM: POTASSIUM: 3.1 mmol/L — AB (ref 3.5–5.1)

## 2017-03-08 LAB — PHOSPHORUS: PHOSPHORUS: 2 mg/dL — AB (ref 2.5–4.6)

## 2017-03-08 LAB — MAGNESIUM: MAGNESIUM: 1.8 mg/dL (ref 1.7–2.4)

## 2017-03-08 MED ORDER — K PHOS MONO-SOD PHOS DI & MONO 155-852-130 MG PO TABS
500.0000 mg | ORAL_TABLET | Freq: Four times a day (QID) | ORAL | Status: AC
Start: 2017-03-08 — End: 2017-03-08
  Administered 2017-03-08 (×4): 500 mg via ORAL
  Filled 2017-03-08 (×4): qty 2

## 2017-03-08 MED ORDER — POTASSIUM CHLORIDE CRYS ER 20 MEQ PO TBCR
40.0000 meq | EXTENDED_RELEASE_TABLET | Freq: Once | ORAL | Status: AC
  Administered 2017-03-08: 40 meq via ORAL
  Filled 2017-03-08: qty 2

## 2017-03-08 MED ORDER — MAGNESIUM SULFATE 2 GM/50ML IV SOLN
2.0000 g | Freq: Once | INTRAVENOUS | Status: AC
  Administered 2017-03-08: 2 g via INTRAVENOUS
  Filled 2017-03-08: qty 50

## 2017-03-08 NOTE — Progress Notes (Signed)
[place] at [place] NAME: John Booker    MR#:  [id number]  DATE OF BIRTH:  Sep 11, 1950  SUBJECTIVE: Patient is here for diarrhea, found to have C. difficile colitis.  2 episodes of loose stool today too., no abdominal pain or nausea.  Flat affect.  Wants to  go to have because he feels really weak when getting up.  CHIEF COMPLAINT:   Chief Complaint  Patient presents with  . Alcohol Problem    REVIEW OF SYSTEMS:   ROS CONSTITUTIONAL: No fever, fatigue or weakness.  EYES: No blurred or double vision.  EARS, NOSE, AND THROAT: No tinnitus or ear pain.  RESPIRATORY: No cough, shortness of breath, wheezing or hemoptysis.  CARDIOVASCULAR: No chest pain, orthopnea, edema.  GASTROINTESTINAL: No nausea, vomiting,.  2 episodes of loose stool this morning. GENITOURINARY: No dysuria, hematuria.  ENDOCRINE: No polyuria, nocturia,  HEMATOLOGY: No anemia, easy bruising or bleeding SKIN: No rash or lesion. MUSCULOSKELETAL: No joint pain or arthritis.   NEUROLOGIC: No tingling, numbness, weakness.  PSYCHIATRY: No anxiety or depression.  He says he is feels really weak and unstable when he walks. DRUG ALLERGIES:   Allergies  Allergen Reactions  . Adalat [Nifedipine]     Foot swelling.     VITALS:  Blood pressure (!) 148/78, pulse 84, temperature 98.7 F (37.1 C), temperature source Oral, resp. rate 16, height 6\' 3"  (1.905 m), weight 86.3 kg (190 lb 3.2 oz), SpO2 100 %.  PHYSICAL EXAMINATION:  GENERAL:  65 y.o.-year-old patient lying in the bed with no acute distress.  EYES: Pupils equal, round, reactive to light . No scleral icterus. Extraocular muscles intact.  HEENT: Head atraumatic, normocephalic. Oropharynx and nasopharynx clear.  NECK:  Supple, no jugular venous distention. No thyroid enlargement, no tenderness.  LUNGS: Normal breath sounds bilaterally, no wheezing, rales,rhonchi or crepitation. No use of accessory muscles of  respiration.  CARDIOVASCULAR: S1, S2 normal. No murmurs, rubs, or gallops.  ABDOMEN: Soft, nontender, nondistended. Bowel sounds present. No organomegaly or mass.  EXTREMITIES: No pedal edema, cyanosis, or clubbing.  NEUROLOGIC: Cranial nerves II through XII are intact. Muscle strength 5/5 in all extremities. Sensation intact. Gait not checked.  PSYCHIATRIC: The patient is alert and oriented x 3.  SKIN: No obvious rash, lesion, or ulcer.    LABORATORY PANEL:   CBC Recent Labs  Lab 03/04/17 1946  WBC 5.8  HGB 14.8  HCT 43.1  PLT 136*   ------------------------------------------------------------------------------------------------------------------  Chemistries  Recent Labs  Lab 03/04/17 1946  03/07/17 0630 03/08/17 0654  NA 134*   < > 133*  --   K 3.2*   < > 3.4* 3.1*  CL 92*   < > 100*  --   CO2 30   < > 27  --   GLUCOSE 132*   < > 96  --   BUN 18   < > 11  --   CREATININE 1.11   < > 0.66  --   CALCIUM 8.6*   < > 8.9  --   MG  --    < > 1.8 1.8  AST 128*  --   --   --   ALT 75*  --   --   --   ALKPHOS 120  --   --   --   BILITOT 2.4*  --   --   --    < > = values in this interval not displayed.   ------------------------------------------------------------------------------------------------------------------  Cardiac Enzymes No results for input(s): TROPONINI in the last 168 hours. ------------------------------------------------------------------------------------------------------------------  RADIOLOGY:  No results found.  EKG:   Orders placed or performed during the hospital encounter of 03/03/17  . EKG 12-Lead  . EKG 12-Lead  . EKG    ASSESSMENT AND PLAN:   C. difficile colitis: Continue vancomycin, continue enteric precautions: Continue p.o. vancomycin.  2.  Generalized weakness secondary to diarrhea, with electrolyte imbalance  \ Hypokalemia, hypermagnesemia, hypophosphatemia: Replace, recheck. Diarrhea severe it is decreasing    #3  .suicidal ideation: Seen by psychiatric, no further suicidal ideation, IVC discontinued.    #4 deconditioning: PT  therapy recommended skilled nursing, likely discharge tomorrow. Discussed with nurse.  All the records are reviewed and case discussed with Care Management/Social Workerr. Management plans discussed with the patient, family and they are in agreement.  CODE STATUS: Full code  TOTAL TIME TAKING CARE OF THIS PATIENT: 35 minutes.   POSSIBLE D/C IN 1-2 DAYS, DEPENDING ON CLINICAL CONDITION.   Epifanio Lesches M.D on 03/08/2017 at 12:54 PM  Between 7am to 6pm - Pager - [phone]  After 6pm go to [email or web address] - password [email or web address] [facility]  Office  [phone]  CC: Primary care physician; Olin Hauser, DO   Note: This dictation was prepared with Dragon dictation along with smaller phrase technology. Any transcriptional errors that result from this process are unintentional.

## 2017-03-08 NOTE — Consult Note (Signed)
MEDICATION RELATED CONSULT NOTE - INITIAL   Pharmacy Consult for electrolytes Indication: hypokalemia, hypophos  Allergies  Allergen Reactions  . Adalat [Nifedipine]     Foot swelling.     Patient Measurements: Height: 6\' 3"  (190.5 cm) Weight: 190 lb 3.2 oz (86.3 kg) IBW/kg (Calculated) : 84.5 Adjusted Body Weight:   Vital Signs: Temp: 98.7 F (37.1 C) (12/30 0818) Temp Source: Oral (12/30 0818) BP: 148/78 (12/30 0818) Pulse Rate: 84 (12/30 0818) Intake/Output from previous day: 12/29 0701 - 12/30 0700 In: -  Out: 625 [Urine:625] Intake/Output from this shift: No intake/output data recorded.  Labs: Recent Labs    03/06/17 1155 03/06/17 1216 03/07/17 0630 03/08/17 0654  CREATININE 0.78  --  0.66  --   MG 1.4*  --  1.8 1.8  PHOS  --  1.3*  --  2.0*   Estimated Creatinine Clearance: 105.6 mL/min (by C-G formula based on SCr of 0.66 mg/dL).   Microbiology: Recent Results (from the past 720 hour(s))  C difficile quick scan w PCR reflex     Status: Abnormal   Collection Time: 03/05/17 10:23 AM  Result Value Ref Range Status   C Diff antigen POSITIVE (A) NEGATIVE Final   C Diff toxin NEGATIVE NEGATIVE Final   C Diff interpretation Results are indeterminate. See PCR results.  Final    Comment: Performed at [facility], [address] [place], [place] [address]  C. Diff by PCR, Reflexed     Status: Abnormal   Collection Time: 03/05/17 10:23 AM  Result Value Ref Range Status   Toxigenic C. Difficile by PCR POSITIVE (A) NEGATIVE Final    Comment: Positive for toxigenic C. difficile with little to no toxin production. Only treat if clinical presentation suggests symptomatic illness. Performed at [facility], [address], [place], [place] [address]     Medical History: Past Medical History:  Diagnosis Date  . Hypertension     Medications:  Scheduled:  . amLODipine  10 mg Oral Daily  . cloNIDine  0.2 mg Oral Daily  . enoxaparin  (LOVENOX) injection  40 mg Subcutaneous Q24H  . feeding supplement (ENSURE ENLIVE)  237 mL Oral BID BM  . folic acid  1 mg Oral Daily  . lisinopril  20 mg Oral Daily  . LORazepam  0-4 mg Intravenous Q12H  . multivitamin with minerals  1 tablet Oral Daily  . phosphorus  500 mg Oral QID  . pneumococcal 23 valent vaccine  0.5 mL Intramuscular Tomorrow-1000  . potassium chloride  40 mEq Oral Once  . sodium chloride flush  3 mL Intravenous Q12H  . thiamine  100 mg Oral Daily   Or  . thiamine  100 mg Intravenous Daily  . vancomycin  125 mg Oral Q6H  . vitamin C  500 mg Oral Daily    Assessment: Pt is a 66 year old male admitted with alcohol withdrawal, also with cdiff. Found to have electrolyte adnormalities  Goal of Therapy:  Normalization of electrolytes  Plan:  Kphos neutral 500mg  QID x 4 doses KCL 40 MEQ po once Recheck in the AM  [employer], Pharm.D, BCPS Clinical Pharmacist  03/08/2017,8:41 AM

## 2017-03-08 NOTE — Clinical Social Work Note (Signed)
Clinical Social Work Assessment  Patient Details  Name: John Booker MRN: [id number] Date of Birth: 1951/02/26  Date of referral:  03/08/17               Reason for consult:  Facility Placement                Permission sought to share information with:  Facility Art therapist granted to share information::  Yes, Verbal Permission Granted  Name::        Agency::     Relationship::     Contact Information:     Housing/Transportation Living arrangements for the past 2 months:  Single Family Home Source of Information:  Patient, Medical Team Patient Interpreter Needed:  None Criminal Activity/Legal Involvement Pertinent to Current Situation/Hospitalization:    Significant Relationships:  Adult Children Lives with:  Self Do you feel safe going back to the place where you live?  No(Does not feel safe due to self-care needs) Need for family participation in patient care:  No (Coment)  Care giving concerns:  PT recommendation for STR/Patient inquiring about inpatient/residential assistance with ETOH use   Social Worker assessment / plan:  CSW met with the patient at bedside to discuss discharge planning. The patient gave verbal permission to conduct a referral for STR and named [facility] as a definite "no" due to past experiences. The CSW explained the process for discharge and further explained that due to his c. Diff status, the patient would not be able to go into inpatient substance treatment until it was cleared. The patient verbalized understanding and declined outpatient resources. The CSW advised the patient to utilize the SW at whichever facility he chooses for STR to assist with ETOH resources.   The CSW has sent the referral. The patient will most likely discharge tomorrow and will need EMS for transport. CSW will facilitate discharge.  Employment status:  Retired Nurse, adult PT Recommendations:  [place] / Referral to community resources:  [place]  Patient/Family's Response to care:  The patient was irritable but thanked the CSW for assistance.  Patient/Family's Understanding of and Emotional Response to Diagnosis, Current Treatment, and Prognosis:  The patient seems to understand the discharge plan and is in tentative agreement. He would prefer to remain at the hospital for the duration of his care; however, he understands that is not an option.  Emotional Assessment Appearance:  Appears stated age Attitude/Demeanor/Rapport:  Guarded Affect (typically observed):  Defensive, Irritable Orientation:  Oriented to Self, Oriented to Place, Oriented to  Time, Oriented to Situation Alcohol / Substance use:  Alcohol Use Psych involvement (Current and /or in the community):  No (Comment)  Discharge Needs  Concerns to be addressed:  Care Coordination, Discharge Planning Concerns Readmission within the last 30 days:  No Current discharge risk:  Substance Abuse Barriers to Discharge:  Continued Medical Work up   [employer], LCSW 03/08/2017, 4:14 PM

## 2017-03-08 NOTE — Progress Notes (Signed)
Central Tele called to inform this RN that the patient's heart rate was in the 150s.  This RN went to room and found patient laying in the hospital bed in no acute distress.  Patient said he felt fine.  Lead placement verified.

## 2017-03-08 NOTE — NC FL2 (Signed)
[place] LEVEL OF CARE SCREENING TOOL     IDENTIFICATION  Patient Name: John Booker Birthdate: 07/22/1950 Sex: male Admission Date (Current Location): 03/04/2017  [place] and [place] Number:  Engineering geologist and Address:  [facility], [address] [place], [place] [address]      Provider Number: [id number]  Attending Physician Name and Address:  Epifanio Lesches, MD  Relative Name and Phone Number:       Current Level of Care: Hospital Recommended Level of Care: [place] Prior Approval Number:    Date Approved/Denied:   PASRR Number: [id number]  Discharge Plan: SNF    Current Diagnoses: Patient Active Problem List   Diagnosis Date Noted  . Alcohol abuse 03/06/2017  . Substance induced mood disorder ([place]) 03/06/2017  . Alcohol withdrawal ([place]) 03/05/2017  . Abnormal glucose 12/20/2015  . Peri-rectal abscess 11/19/2015  . Perineal abscess 11/15/2015  . HTN (hypertension) 11/15/2015  . Vitamin D deficiency 11/15/2015    Orientation RESPIRATION BLADDER Height & Weight     Self, Time, Situation, Place  Normal Continent Weight: 190 lb 3.2 oz (86.3 kg) Height:  6\' 3"  (190.5 cm)  BEHAVIORAL SYMPTOMS/MOOD NEUROLOGICAL BOWEL NUTRITION STATUS      Continent    AMBULATORY STATUS COMMUNICATION OF NEEDS Skin   Extensive Assist Verbally Normal                       Personal Care Assistance Level of Assistance  Bathing, Feeding, Dressing Bathing Assistance: Limited assistance Feeding assistance: Independent Dressing Assistance: Limited assistance     Functional Limitations Info             SPECIAL CARE FACTORS FREQUENCY  PT (By licensed PT)     PT Frequency: 5/week              Contractures Contractures Info: Not present    Additional Factors Info  Code Status, Allergies Code Status Info: Full Allergies Info: Adalat            Current Medications (03/08/2017):  This is  the current hospital active medication list Current Facility-Administered Medications  Medication Dose Route Frequency Provider Last Rate Last Dose  . acetaminophen (TYLENOL) tablet 650 mg  650 mg Oral Q6H PRN Harrie Foreman, MD       Or  . acetaminophen (TYLENOL) suppository 650 mg  650 mg Rectal Q6H PRN Harrie Foreman, MD      . amLODipine (NORVASC) tablet 10 mg  10 mg Oral Daily Harrie Foreman, MD   10 mg at 03/08/17 1038  . chlorproMAZINE (THORAZINE) 12.5 mg in sodium chloride 0.9 % 25 mL IVPB  12.5 mg Intravenous Q6H PRN Epifanio Lesches, MD   Stopped at 03/05/17 1515  . cloNIDine (CATAPRES) tablet 0.2 mg  0.2 mg Oral Daily Epifanio Lesches, MD   0.2 mg at 03/08/17 1038  . enoxaparin (LOVENOX) injection 40 mg  40 mg Subcutaneous Q24H Harrie Foreman, MD   40 mg at 03/07/17 2211  . feeding supplement (ENSURE ENLIVE) (ENSURE ENLIVE) liquid 237 mL  237 mL Oral BID BM Epifanio Lesches, MD   237 mL at 03/06/17 1351  . folic acid (FOLVITE) tablet 1 mg  1 mg Oral Daily Harrie Foreman, MD   1 mg at 03/08/17 1038  . lisinopril (PRINIVIL,ZESTRIL) tablet 20 mg  20 mg Oral Daily Harrie Foreman, MD   20 mg at 03/08/17 1038  .  LORazepam (ATIVAN) injection 0-4 mg  0-4 mg Intravenous Q12H Harrie Foreman, MD      . multivitamin with minerals tablet 1 tablet  1 tablet Oral Daily Harrie Foreman, MD   1 tablet at 03/08/17 1037  . ondansetron (ZOFRAN) tablet 4 mg  4 mg Oral Q6H PRN Harrie Foreman, MD       Or  . ondansetron [facility]) injection 4 mg  4 mg Intravenous Q6H PRN Harrie Foreman, MD      . phosphorus (K PHOS NEUTRAL) tablet 500 mg  500 mg Oral QID Marcelle Overlie D, RPH   500 mg at 03/08/17 1529  . pneumococcal 23 valent vaccine (PNU-IMMUNE) injection 0.5 mL  0.5 mL Intramuscular Tomorrow-1000 Epifanio Lesches, MD      . sodium chloride flush (NS) 0.9 % injection 3 mL  3 mL Intravenous Q12H Epifanio Lesches, MD   3 mL at 03/08/17 1039  .  thiamine (VITAMIN B-1) tablet 100 mg  100 mg Oral Daily Harrie Foreman, MD   100 mg at 03/08/17 1038   Or  . thiamine (B-1) injection 100 mg  100 mg Intravenous Daily Harrie Foreman, MD      . vancomycin (VANCOCIN) 50 mg/mL oral solution 125 mg  125 mg Oral Q6H Epifanio Lesches, MD   125 mg at 03/08/17 4010  . vitamin C (ASCORBIC ACID) tablet 500 mg  500 mg Oral Daily Epifanio Lesches, MD   500 mg at 03/08/17 1038     Discharge Medications: Please see discharge summary for a list of discharge medications.  Relevant Imaging Results:  Relevant Lab Results:   Additional Information SS# [id number]  Zettie Pho, LCSW

## 2017-03-09 LAB — POTASSIUM
POTASSIUM: 3 mmol/L — AB (ref 3.5–5.1)
Potassium: 4.1 mmol/L (ref 3.5–5.1)

## 2017-03-09 LAB — MAGNESIUM: MAGNESIUM: 1.9 mg/dL (ref 1.7–2.4)

## 2017-03-09 LAB — PHOSPHORUS: Phosphorus: 4.2 mg/dL (ref 2.5–4.6)

## 2017-03-09 MED ORDER — ENSURE ENLIVE PO LIQD: 237.0000 mL | Freq: Two times a day (BID) | ORAL | 12 refills | Status: DC

## 2017-03-09 MED ORDER — CLONIDINE HCL 0.2 MG PO TABS: 0.2000 mg | ORAL_TABLET | Freq: Every day | ORAL | 11 refills | Status: DC

## 2017-03-09 MED ORDER — POTASSIUM CHLORIDE 20 MEQ PO PACK: 20.0000 meq | PACK | Freq: Every day | ORAL | 0 refills | Status: DC

## 2017-03-09 MED ORDER — VANCOMYCIN 50 MG/ML ORAL SOLUTION: 125.0000 mg | Freq: Four times a day (QID) | ORAL | 0 refills | Status: DC

## 2017-03-09 MED ORDER — POTASSIUM CHLORIDE CRYS ER 20 MEQ PO TBCR
40.0000 meq | EXTENDED_RELEASE_TABLET | ORAL | Status: AC
  Administered 2017-03-09 (×2): 40 meq via ORAL
  Filled 2017-03-09 (×2): qty 2

## 2017-03-09 NOTE — Progress Notes (Signed)
[place] [facility] [place] NAME: John Booker    MR#:  [id number]  DATE OF BIRTH:  01-28-1951  SUBJECTIVE: Diarrhea, patient wants to go to rehab today, still awaiting for placement as per social worker.  CHIEF COMPLAINT:   Chief Complaint  Patient presents with  . Alcohol Problem    REVIEW OF SYSTEMS:   ROS CONSTITUTIONAL: No fever, fatigue or weakness.  EYES: No blurred or double vision.  EARS, NOSE, AND THROAT: No tinnitus or ear pain.  RESPIRATORY: No cough, shortness of breath, wheezing or hemoptysis.  CARDIOVASCULAR: No chest pain, orthopnea, edema.  GASTROINTESTINAL: No nausea, vomiting,.  2 episodes of loose stool this morning. GENITOURINARY: No dysuria, hematuria.  ENDOCRINE: No polyuria, nocturia,  HEMATOLOGY: No anemia, easy bruising or bleeding SKIN: No rash or lesion. MUSCULOSKELETAL: No joint pain or arthritis.   NEUROLOGIC: No tingling, numbness, weakness.  PSYCHIATRY: No anxiety or depression.  He says he is feels really weak and unstable when he walks. DRUG ALLERGIES:   Allergies  Allergen Reactions  . Adalat [Nifedipine]     Foot swelling.     VITALS:  Blood pressure (!) 142/90, pulse 87, temperature 97.9 F (36.6 C), resp. rate 17, height 6\' 3"  (1.905 m), weight 84.6 kg (186 lb 9.6 oz), SpO2 100 %.  PHYSICAL EXAMINATION:  GENERAL:  66 y.o.-year-old patient lying in the bed with no acute distress.  EYES: Pupils equal, round, reactive to light . No scleral icterus. Extraocular muscles intact.  HEENT: Head atraumatic, normocephalic. Oropharynx and nasopharynx clear.  NECK:  Supple, no jugular venous distention. No thyroid enlargement, no tenderness.  LUNGS: Normal breath sounds bilaterally, no wheezing, rales,rhonchi or crepitation. No use of accessory muscles of respiration.  CARDIOVASCULAR: S1, S2 normal. No murmurs, rubs, or gallops.  ABDOMEN: Soft, nontender, nondistended. Bowel sounds present. No  organomegaly or mass.  EXTREMITIES: No pedal edema, cyanosis, or clubbing.  NEUROLOGIC: Cranial nerves II through XII are intact. Muscle strength 5/5 in all extremities. Sensation intact. Gait not checked.  PSYCHIATRIC: The patient is alert and oriented x 3.  SKIN: No obvious rash, lesion, or ulcer.    LABORATORY PANEL:   CBC Recent Labs  Lab 03/04/17 1946  WBC 5.8  HGB 14.8  HCT 43.1  PLT 136*   ------------------------------------------------------------------------------------------------------------------  Chemistries  Recent Labs  Lab 03/04/17 1946  03/07/17 0630  03/09/17 0404  NA 134*   < > 133*  --   --   K 3.2*   < > 3.4*   < > 3.0*  CL 92*   < > 100*  --   --   CO2 30   < > 27  --   --   GLUCOSE 132*   < > 96  --   --   BUN 18   < > 11  --   --   CREATININE 1.11   < > 0.66  --   --   CALCIUM 8.6*   < > 8.9  --   --   MG  --    < > 1.8   < > 1.9  AST 128*  --   --   --   --   ALT 75*  --   --   --   --   ALKPHOS 120  --   --   --   --   BILITOT 2.4*  --   --   --   --    < > =  values in this interval not displayed.   ------------------------------------------------------------------------------------------------------------------  Cardiac Enzymes No results for input(s): TROPONINI in the last 168 hours. ------------------------------------------------------------------------------------------------------------------  RADIOLOGY:  No results found.  EKG:   Orders placed or performed during the hospital encounter of 03/03/17  . EKG 12-Lead  . EKG 12-Lead  . EKG    ASSESSMENT AND PLAN:   C. difficile colitis: Continue vancomycin, continue enteric precautions: Continue p.o. vancomycin.  Discharge instructions are prepared, patient really wants to go to nursing home today but waiting for placement as per social worker.  2.  Generalized weakness secondary to diarrhea, with electrolyte imbalance  \ Hypokalemia, hypermagnesemia, hypophosphatemia:  Replace, recheck. Diarrhea severe it is decreasing Hypokalemia: Continue to replace potassium even at nursing home.  For 3-5 days.    #3 .suicidal ideation: Seen by psychiatric, no further suicidal ideation, IVC discontinued.    #4 deconditioning: PT  therapy recommended skilled nursing,  stable for discharge when arrangements are made.  All the records are reviewed and case discussed with Care Management/Social Workerr. Management plans discussed with the patient, family and they are in agreement.  CODE STATUS: Full code  TOTAL TIME TAKING CARE OF THIS PATIENT: 35 minutes.   POSSIBLE D/C IN 1-2 DAYS, DEPENDING ON CLINICAL CONDITION.   Epifanio Lesches M.D on 03/09/2017 at 12:32 PM  Between 7am to 6pm - Pager - [phone]  After 6pm go to [email or web address] - password [email or web address] [place] [facility]  Office  [phone]  CC: Primary care physician; Olin Hauser, DO   Note: This dictation was prepared with Dragon dictation along with smaller phrase technology. Any transcriptional errors that result from this process are unintentional.

## 2017-03-09 NOTE — Consult Note (Signed)
MEDICATION RELATED CONSULT NOTE - INITIAL   Pharmacy Consult for electrolytes Indication: hypokalemia, hypophos  Allergies  Allergen Reactions  . Adalat [Nifedipine]     Foot swelling.     Patient Measurements: Height: 6\' 3"  (190.5 cm) Weight: 186 lb 9.6 oz (84.6 kg) IBW/kg (Calculated) : 84.5 Adjusted Body Weight:   Vital Signs: Temp: 97.9 F (36.6 C) (12/31 0420) Temp Source: Oral (12/30 1945) BP: 160/78 (12/31 0420) Pulse Rate: 81 (12/31 0420) Intake/Output from previous day: 12/30 0701 - 12/31 0700 In: 3 [I.V.:3] Out: 450 [Urine:450] Intake/Output from this shift: No intake/output data recorded.  Labs: Recent Labs    03/06/17 1155 03/06/17 1216 03/07/17 0630 03/08/17 0654 03/09/17 0404  CREATININE 0.78  --  0.66  --   --   MG 1.4*  --  1.8 1.8 1.9  PHOS  --  1.3*  --  2.0* 4.2   Estimated Creatinine Clearance: 105.6 mL/min (by C-G formula based on SCr of 0.66 mg/dL).   Microbiology: Recent Results (from the past 720 hour(s))  C difficile quick scan w PCR reflex     Status: Abnormal   Collection Time: 03/05/17 10:23 AM  Result Value Ref Range Status   C Diff antigen POSITIVE (A) NEGATIVE Final   C Diff toxin NEGATIVE NEGATIVE Final   C Diff interpretation Results are indeterminate. See PCR results.  Final    Comment: Performed at [facility], [address] [place], [place] [address]  C. Diff by PCR, Reflexed     Status: Abnormal   Collection Time: 03/05/17 10:23 AM  Result Value Ref Range Status   Toxigenic C. Difficile by PCR POSITIVE (A) NEGATIVE Final    Comment: Positive for toxigenic C. difficile with little to no toxin production. Only treat if clinical presentation suggests symptomatic illness. Performed at [facility], [address] [place], [place] [address]     Medical History: Past Medical History:  Diagnosis Date  . Hypertension     Medications:  Scheduled:  . amLODipine  10 mg Oral Daily  . cloNIDine   0.2 mg Oral Daily  . enoxaparin (LOVENOX) injection  40 mg Subcutaneous Q24H  . feeding supplement (ENSURE ENLIVE)  237 mL Oral BID BM  . folic acid  1 mg Oral Daily  . lisinopril  20 mg Oral Daily  . LORazepam  0-4 mg Intravenous Q12H  . multivitamin with minerals  1 tablet Oral Daily  . pneumococcal 23 valent vaccine  0.5 mL Intramuscular Tomorrow-1000  . potassium chloride  40 mEq Oral Q4H  . sodium chloride flush  3 mL Intravenous Q12H  . thiamine  100 mg Oral Daily   Or  . thiamine  100 mg Intravenous Daily  . vancomycin  125 mg Oral Q6H  . vitamin C  500 mg Oral Daily    Assessment: Pt is a 66 year old male admitted with alcohol withdrawal, also with cdiff. Found to have electrolyte adnormalities  Goal of Therapy:  Normalization of electrolytes  Plan:  K=3.0 Phos improved nicely to 4.2, Mg=1.8 KCL 40 MEQ po q 4 hours x 2 doses Recheck @ [place] D Nettie Wyffels, Pharm.D, BCPS Clinical Pharmacist  03/09/2017,7:28 AM

## 2017-03-09 NOTE — Care Management Important Message (Signed)
Important Message  Patient Details  Name: John Booker MRN: [id number] Date of Birth: 24-Jul-1950   Medicare Important Message Given:  Yes Signed IM notice given   Katrina Stack, RN 03/09/2017, 4:31 PM

## 2017-03-09 NOTE — Progress Notes (Signed)
Physical Therapy Treatment Patient Details Name: John Booker MRN: [id number] DOB: [birth year] Today's Date: 03/09/2017    History of Present Illness Pt is a 66 y.o. male presenting to ED for alcohol detox (pt found intoxicated and sitting on couch with gun beside him); of note pt with recent ED visit with weakness and alcohol intoxication.  Pt admitted with alcohol withdrawal, diarrhea (positive for c-diff), and hypokalemia.  PMH includes htn and alcohol abuse.    PT Comments    Pt agreeable to PT; denies pain or other problems. Pt progressing mobility well demonstrating Mod I in the room with bed mobility and supervision with STS transfers. Pt also demonstrates ambulation in the room without assistive device or loss of balance with supervision. Ambulation for progressed distance of 355 ft with rolling walker and Min guard/supervision with improved speed and no loss of balance. Speed within parameters for age; subjectively pt feels he is 85% of baseline ambulation/mobility. Discharge recommendation updated to outpatient PT and intermittent supervision for safety; SW updated as well. Continue to progress quality of mobility to ensure an optimal, safe return home.   Follow Up Recommendations  Supervision - Intermittent;Outpatient PT     Equipment Recommendations       Recommendations for Other Services       Precautions / Restrictions Precautions Precautions: Fall Restrictions Weight Bearing Restrictions: No    Mobility  Bed Mobility Overal bed mobility: Independent                Transfers Overall transfer level: Needs assistance Equipment used: Rolling walker (2 wheeled) Transfers: Sit to/from Stand Sit to Stand: Supervision         General transfer comment: Good use of hands, no LOB   Ambulation/Gait Ambulation/Gait assistance: Min guard;Supervision Ambulation Distance (Feet): 355 Feet Assistive device: Rolling walker (2 wheeled) Gait Pattern/deviations:  Decreased dorsiflexion - left;Decreased dorsiflexion - right;Decreased stride length   Gait velocity interpretation: at or above normal speed for age/gender(2.86 ft/sec) General Gait Details: Pt notes mildly below baseline; pt feels he is "85%" of baseline walking. Pt demonstrates no LOB on straight path, turns and with head turns during ambulation. Ambulates short distances in room without AD without LOB as well.    Stairs            Wheelchair Mobility    Modified Rankin (Stroke Patients Only)       Balance Overall balance assessment: Modified Independent                                          Cognition Arousal/Alertness: Awake/alert Behavior During Therapy: WFL for tasks assessed/performed Overall Cognitive Status: Within Functional Limits for tasks assessed                                        Exercises      General Comments        Pertinent Vitals/Pain Pain Assessment: No/denies pain    Home Living                      Prior Function            PT Goals (current goals can now be found in the care plan section) Progress towards PT goals: Progressing toward goals  Frequency    Min 2X/week      PT Plan Discharge plan needs to be updated    Co-evaluation              AM-PAC PT "6 Clicks" Daily Activity  Outcome Measure  Difficulty turning over in bed (including adjusting bedclothes, sheets and blankets)?: None Difficulty moving from lying on back to sitting on the side of the bed? : None Difficulty sitting down on and standing up from a chair with arms (e.g., wheelchair, bedside commode, etc,.)?: None Help needed moving to and from a bed to chair (including a wheelchair)?: None Help needed walking in hospital room?: None Help needed climbing 3-5 steps with a railing? : A Little 6 Click Score: 23    End of Session Equipment Utilized During Treatment: Gait belt Activity Tolerance: Patient  tolerated treatment well Patient left: in chair;with call bell/phone within reach;with chair alarm set;Other (comment)(pt wishes feet dependent)   PT Visit Diagnosis: Unsteadiness on feet (R26.81);Other abnormalities of gait and mobility (R26.89);Muscle weakness (generalized) (M62.81);History of falling (Z91.81)     Time: 7290-2111 PT Time Calculation (min) (ACUTE ONLY): 23 min  Charges:  $Gait Training: 23-37 mins                    G Codes:        Larae Grooms, PTA 03/09/2017, 2:12 PM

## 2017-03-09 NOTE — Clinical Social Work Note (Signed)
CSW received phone call from PT, they have changed the recommendation to outpatient PT.  CSW updated patient that he will most likely not be approved for SNF due to PT recommendations.  Patient expressed understanding, and he will plan to discharge home, CSW updated case manager.  CSW to continue to follow in case patient's progress changes.  Jones Broom. [place], MSW, [place]  03/09/2017 7:10 PM

## 2017-03-10 DIAGNOSIS — Z23 Encounter for immunization: Secondary | ICD-10-CM | POA: Diagnosis not present

## 2017-03-10 LAB — MAGNESIUM: MAGNESIUM: 1.9 mg/dL (ref 1.7–2.4)

## 2017-03-10 NOTE — Care Management (Signed)
Spoke with patient regarding anticipated discharge today and outpatient physical therapy.  Patient confirm has pcp.  Outpatient referral faxed to [phone].  He says that he has not read the resources for etoh but says he quit drinking for 9 years and started back during period when he was going through a divorce about 4 years ago. Confirmed  his contact information

## 2017-03-10 NOTE — Consult Note (Signed)
MEDICATION RELATED CONSULT NOTE - INITIAL   Pharmacy Consult for electrolytes Indication: hypokalemia, hypophos  Allergies  Allergen Reactions  . Adalat [Nifedipine]     Foot swelling.     Patient Measurements: Height: 6\' 3"  (190.5 cm) Weight: 186 lb 1.6 oz (84.4 kg) IBW/kg (Calculated) : 84.5 Adjusted Body Weight:   Vital Signs: Temp: 98.5 F (36.9 C) (01/01 0914) Temp Source: Oral (01/01 0914) BP: 156/63 (01/01 0914) Pulse Rate: 92 (01/01 0914) Intake/Output from previous day: 12/31 0701 - 01/01 0700 In: 3 [I.V.:3] Out: 700 [Urine:700] Intake/Output from this shift: No intake/output data recorded.  Labs: Recent Labs    03/08/17 0654 03/09/17 0404 03/10/17 0617  MG 1.8 1.9 1.9  PHOS 2.0* 4.2  --    Estimated Creatinine Clearance: 105.5 mL/min (by C-G formula based on SCr of 0.66 mg/dL).   Microbiology: Recent Results (from the past 720 hour(s))  C difficile quick scan w PCR reflex     Status: Abnormal   Collection Time: 03/05/17 10:23 AM  Result Value Ref Range Status   C Diff antigen POSITIVE (A) NEGATIVE Final   C Diff toxin NEGATIVE NEGATIVE Final   C Diff interpretation Results are indeterminate. See PCR results.  Final    Comment: Performed at [facility], [address] [place], [place] [address]  C. Diff by PCR, Reflexed     Status: Abnormal   Collection Time: 03/05/17 10:23 AM  Result Value Ref Range Status   Toxigenic C. Difficile by PCR POSITIVE (A) NEGATIVE Final    Comment: Positive for toxigenic C. difficile with little to no toxin production. Only treat if clinical presentation suggests symptomatic illness. Performed at [facility], [address] [place] [address], [place], [place] [address]     Medical History: Past Medical History:  Diagnosis Date  . Hypertension     Medications:  Scheduled:  . amLODipine  10 mg Oral Daily  . cloNIDine  0.2 mg Oral Daily  . enoxaparin (LOVENOX) injection  40 mg Subcutaneous Q24H  . feeding  supplement (ENSURE ENLIVE)  237 mL Oral BID BM  . folic acid  1 mg Oral Daily  . lisinopril  20 mg Oral Daily  . multivitamin with minerals  1 tablet Oral Daily  . sodium chloride flush  3 mL Intravenous Q12H  . thiamine  100 mg Oral Daily   Or  . thiamine  100 mg Intravenous Daily  . vancomycin  125 mg Oral Q6H  . vitamin C  500 mg Oral Daily    Assessment: Pt is a 67 year old male admitted with alcohol withdrawal, also with cdiff. Found to have electrolyte adnormalities  Goal of Therapy:  Normalization of electrolytes  Plan:  K=3.0 Phos improved nicely to 4.2, Mg=1.8 KCL 40 MEQ po q 4 hours x 2 doses Recheck @ 1800  1/1 @ 1305 Mg wnl this AM and K wnl last PM. Patient has orders for d/c but will order AM labs in case d/c cancelled.   Ulice Dash, PharmD Clinical Pharmacist  03/10/2017,1:05 PM

## 2017-03-10 NOTE — Progress Notes (Signed)
Discharged to home with his daughter.  She will pick up prescriptions and coordinate his appointments.

## 2017-03-10 NOTE — Clinical Social Work Note (Signed)
Patient will be discharging back home today, and PT recommended outpatient therapy.  Patient was also given substance abuse resources.  CSW to sign off please reconsult if other social work needs arise.  Jones Broom. [place], MSW, [place]  03/10/2017 1:39 PM

## 2017-03-10 NOTE — Progress Notes (Signed)
No further diarrhea, tolerating the diet, potassium better, discharge home today with vancomycin.  Physical therapy recommended home with outpatient physical therapy.  Daughter is coming to pick him up.  Discharge instructions are in the computer.  Patient can continue vancomycin for finishing the course of for 14 days for C. difficile colitis.

## 2017-03-10 NOTE — Plan of Care (Signed)
  Progressing Clinical Measurements: Will remain free from infection 03/10/2017 0115 - Progressing by Jeri Cos, RN

## 2017-03-11 ENCOUNTER — Other Ambulatory Visit: Payer: Self-pay | Admitting: Family Medicine

## 2017-03-11 ENCOUNTER — Telehealth: Payer: Self-pay

## 2017-03-11 DIAGNOSIS — R2681 Unsteadiness on feet: Secondary | ICD-10-CM

## 2017-03-11 DIAGNOSIS — M6281 Muscle weakness (generalized): Secondary | ICD-10-CM

## 2017-03-11 NOTE — Telephone Encounter (Signed)
Transition Care Management Follow-up Telephone Call   Date discharged?03/10/2017   How have you been since you were released from the hospital? "Well"   Do you understand why you were in the hospital? yes   Do you understand the discharge instructions? yes  Where were you discharged to? home  Items Reviewed:  Medications reviewed: yes  Allergies reviewed: yes  Dietary changes reviewed: yes  Referrals reviewed: yes   Functional Questionnaire:   Activities of Daily Living (ADLs):   He states they are independent in the following: ambulation, bathing and hygiene, feeding, continence, grooming, toileting and dressing States they require assistance with the following: none   Any transportation issues/concerns?: no, thinks he will be okay to drive by then. Will call if he needs assistance   Any patient concerns? no   Confirmed importance and date/time of follow-up visits scheduled yes  Provider Appointment booked with Dr.Karamalegos on 03/17/2017 at 8:20am.   Confirmed with patient if condition begins to worsen call PCP or go to the ER.  Patient was given the office number and encouraged to call back with question or concerns.  : yes

## 2017-03-11 NOTE — Progress Notes (Signed)
Received staff message 03/11/17 from [facility], copied below.      Patient was recently admitted to [facility]. Upon D/C from [facility] patient was given an order for outpatient PT, per recommendation of PT eval from admission to [facility]. If you concur can you please add a referral electronically in [facility]. DX reasoning would be any of these: Unsteadiness on feet (R26.81);Other abnormalities of gait and mobility (R26.89);Muscle weakness (generalized) (M62.81).    I have placed order as requested.  [name], DO [facility] 03/11/2017, 1:00 PM

## 2017-03-12 NOTE — Care Management (Signed)
Received call from patient's sister [place]. Unable to find oral vancomycin then when found a pharmacy that had the medication it was over 300 dollars. Patient ruled in for C Diff.   This CM was not aware that patient ruled in for C diff  and discharged on vancomycin on 03/10/2017.  Obtained prescription from [facility] and will have [place] provided vancomycin in suspension.  Pharmacy will tube it up to 2A.  Have asked unit secretary to call [facility] at [phone] when medication is tubed to the unit

## 2017-03-12 NOTE — Discharge Summary (Signed)
John Booker, is a 67 y.o. male  DOB [birth year]  MRN [id number].  Admission date:  03/04/2017  Admitting Physician  Harrie Foreman, MD  Discharge Date:03/09/2017   Primary MD  Olin Hauser, DO  Recommendations for primary care physician for things to follow:  Follow-up with PCP in 1 week    Admission Diagnosis  Suicidal ideation [R45.851] Alcohol withdrawal syndrome without complication ([place]) [N36.144]   Discharge Diagnosis  Suicidal ideation [R45.851] Alcohol withdrawal syndrome without complication ([place]) [R15.400]   Principal Problem:   Alcohol abuse Active Problems:   Alcohol withdrawal ([place])   Substance induced mood disorder ([place])      Past Medical History:  Diagnosis Date  . Hypertension     Past Surgical History:  Procedure Laterality Date  . INCISION AND DRAINAGE PERIRECTAL ABSCESS N/A 11/19/2015   Procedure: IRRIGATION AND DEBRIDEMENT PERIRECTAL ABSCESS and debridement of scrotal abscess;  Surgeon: Florene Glen, MD;  Location: [facility] ORS;  Service: General;  Laterality: N/A;  . INCISION AND DRAINAGE PERIRECTAL ABSCESS N/A 11/20/2015   Procedure: IRRIGATION AND DEBRIDEMENT PERIRECTAL ABSCESS / WITH DRESSING CHANGE;  Surgeon: Florene Glen, MD;  Location: [facility] ORS;  Service: General;  Laterality: N/A;  peri rectal site  . INCISION AND DRAINAGE PERIRECTAL ABSCESS N/A 11/21/2015   Procedure: IRRIGATION AND DEBRIDEMENT PERIRECTAL ABSCESS WITH DRESSING CHANGE;  Surgeon: Florene Glen, MD;  Location: [facility] ORS;  Service: General;  Laterality: N/A;  . [place]       History of present illness and  Hospital Course:     Kindly see H&P for history of present illness and admission details, please review complete Labs, Consult reports and Test reports for all details in  brief  HPI  from the history and physical done on the day of admission 67 year old male patient admitted because of alcohol problems, found to have diarrhea so admitted to medicine service.  Found to have C. difficile colitis.   Hospital Course  #1 C. difficile colitis, started on vancomycin p.o 125 mg every 6 hours, also received IV fluids with potassium., his diarrhea decreased, started to tolerate the diet.  Discharge home with vancomycin 125 mg every 6 hours for 14 days.  2.  Generalized weakness secondary to diarrhea, multiple electrolyte abnormalities: Physical l therapy recommended outpatient physical therapy. #3 severe hypokalemia, hypomagnesemia, hypophosphatemia secondary to GI losses.  Patient received multiple doses of KCl, magnesium, K-Phos. Potassium improved from 2.8-4, magnesium improved from 1.3-1.9 and he felt very good when we discharge him. #3 .suicidal ideation: Seen by psychiatric, no further suicidal ideation, IVC discontinuedPatient has consistently denied any suicidal ideation and does not appear to be depressed.  as per psychThere is no evidence that he was definitely trying to hurt himself although the presence of a gun was worrying while intoxicated.  In any case he no longer meets commitment criteria.  Discontinue the sitter as no longer necessary.     Discharge Condition: Stable   Follow UP  Follow-up Information    Olin Hauser, DO Follow up in 1 week(s).   Specialty:  Family Medicine Contact information: [place] [address] [phone]        [facility] Follow up.   Specialty:  Rehabilitation Why:  A referral has been sent.  Call to follow up as deptartment will need to obtain an order from your primary care [place] Contact information: [place] [id number] ar  [place] [phone]            Discharge Instructions  and  Discharge  Medications      Allergies as of 03/10/2017      Reactions   Adalat [nifedipine]    Foot swelling.       Medication List    STOP taking these medications   hydrochlorothiazide 25 MG tablet Commonly known as:  HYDRODIURIL     TAKE these medications   amLODipine 10 MG tablet Commonly known as:  NORVASC Take 1 tablet (10 mg total) by mouth daily.   cloNIDine 0.2 MG tablet Commonly known as:  CATAPRES Take 1 tablet (0.2 mg total) by mouth daily.   feeding supplement (ENSURE ENLIVE) Liqd Take 237 mLs by mouth 2 (two) times daily between meals.   lisinopril 20 MG tablet Commonly known as:  PRINIVIL,ZESTRIL Take 1 tablet (20 mg total) by mouth daily.   vancomycin 50 mg/mL oral solution Commonly known as:  VANCOCIN Take 2.5 mLs (125 mg total) by mouth every 6 (six) hours.         Diet and Activity recommendation: See Discharge Instructions above   Consults obtained -psychiatriy, physical therapy   Major procedures and Radiology Reports - PLEASE review detailed and final reports for all details, in brief -      Dg Chest 2 View  Result Date: 03/03/2017 CLINICAL DATA:  Weakness. EXAM: CHEST  2 VIEW COMPARISON:  Radiographs of May 26, 2012. FINDINGS: The heart size and mediastinal contours are within normal limits. Both lungs are clear. No pneumothorax or pleural effusion is noted. The visualized skeletal structures are unremarkable. IMPRESSION: No active cardiopulmonary disease. Electronically Signed   By: Marijo Conception, M.D.   On: 03/03/2017 20:45   Ct Head Wo Contrast  Result Date: 03/03/2017 CLINICAL DATA:  67 year old male with head trauma. EXAM: CT HEAD WITHOUT CONTRAST TECHNIQUE: Contiguous axial images were obtained from the base of the skull through the vertex without intravenous contrast. COMPARISON:  None. FINDINGS: Brain: The ventricles and sulci appropriate size for patient's age. Minimal periventricular and deep white matter chronic microvascular  ischemic changes noted. There is no acute intracranial hemorrhage. No mass effect or midline shift. No extra-axial fluid collection. Vascular: No hyperdense vessel or unexpected calcification. Skull: Normal. Negative for fracture or focal lesion. Sinuses/Orbits: There is diffuse mucoperiosteal thickening of paranasal sinuses. No air-fluid levels. The mastoid air cells are clear. Other: None IMPRESSION: 1. No acute intracranial hemorrhage. 2. Mild chronic microvascular ischemic changes. Electronically Signed   By: Anner Crete M.D.   On: 03/03/2017 21:01    Micro Results     Recent Results (from the past 240 hour(s))  C difficile quick scan w PCR reflex     Status: Abnormal   Collection Time: 03/05/17 10:23 AM  Result Value Ref Range Status   C Diff antigen POSITIVE (A) NEGATIVE Final   C Diff toxin NEGATIVE NEGATIVE Final   C Diff interpretation Results are indeterminate. See PCR results.  Final    Comment: Performed at [facility], [address] [place], [place] [address]  C. Diff by PCR, Reflexed     Status: Abnormal   Collection Time: 03/05/17 10:23 AM  Result Value Ref Range Status   Toxigenic C. Difficile by PCR POSITIVE (A) NEGATIVE Final    Comment: Positive for toxigenic C. difficile with little to no toxin production. Only treat if clinical presentation suggests symptomatic illness. Performed at [facility],  [address], [place], [place] [address]        Today   Subjective:   Renard Caperton today has no headache,no chest abdominal pain,no new weakness tingling or numbness, feels much better wants to go home today.   Objective:   Blood pressure (!) 156/63, pulse 92, temperature 98.5 F (36.9 C), temperature source Oral, resp. rate 16, height 6\' 3"  (1.905 m), weight 84.4 kg (186 lb 1.6 oz), SpO2 100 %.  No intake or output data in the 24 hours ending 03/12/17 1224  Exam Awake Alert, Oriented x 3, No new F.N deficits, Normal  affect [place].AT,PERRAL Supple Neck,No JVD, No cervical lymphadenopathy appriciated.  Symmetrical Chest wall movement, Good air movement bilaterally, CTAB RRR,No Gallops,Rubs or new Murmurs, No Parasternal Heave +ve B.Sounds, Abd Soft, Non tender, No organomegaly appriciated, No rebound -guarding or rigidity. No Cyanosis, Clubbing or edema, No new Rash or bruise  Data Review   CBC w Diff:  Lab Results  Component Value Date   WBC 5.8 03/04/2017   HGB 14.8 03/04/2017   HGB 15.7 04/15/2012   HCT 43.1 03/04/2017   HCT 47.7 04/15/2012   PLT 136 (L) 03/04/2017   PLT 175 04/15/2012   LYMPHOPCT 29 03/03/2017   MONOPCT 5 03/03/2017   EOSPCT 0 03/03/2017   BASOPCT 1 03/03/2017    CMP:  Lab Results  Component Value Date   NA 133 (L) 03/07/2017   NA 135 (L) 04/15/2012   K 4.1 03/09/2017   K 3.5 04/15/2012   CL 100 (L) 03/07/2017   CL 98 04/15/2012   CO2 27 03/07/2017   CO2 30 04/15/2012   BUN 11 03/07/2017   BUN 17 04/15/2012   CREATININE 0.66 03/07/2017   CREATININE 1.05 12/19/2015   PROT 7.1 03/04/2017   PROT 7.3 04/15/2012   ALBUMIN 4.3 03/04/2017   ALBUMIN 3.4 04/15/2012   BILITOT 2.4 (H) 03/04/2017   BILITOT 0.8 04/15/2012   ALKPHOS 120 03/04/2017   ALKPHOS 88 04/15/2012   AST 128 (H) 03/04/2017   AST 72 (H) 04/15/2012   ALT 75 (H) 03/04/2017   ALT 63 04/15/2012  .   Total Time in preparing paper work, data evaluation and todays exam - 35 minutes  Epifanio Lesches M.D on 03/09/2017 at 12:24 PM    Note: This dictation was prepared with Dragon dictation along with smaller phrase technology. Any transcriptional errors that result from this process are unintentional.

## 2017-03-16 ENCOUNTER — Other Ambulatory Visit: Payer: Self-pay

## 2017-03-16 NOTE — Patient Outreach (Signed)
[facility]) Care Management  03/16/2017  John Booker Apr 14, 1950 [id number]   EMMI- General Discharge RED ON EMMI ALERT Day # 1 Date: 03-13-17 Red Alert Reason:  Questions about discharge papers? Yes Scheduled follow up? No Unfilled prescriptions? Yes Other questions / problems: yes  Telephone call to patient for EMMI follow up. Patient able to verify HIPAA. Discussed with EMMI red alerts.  Patient reports he has no problems.  Patient report he has an appointment with his primary care physician.    Discussed with patient [facility] services.  Patient declined services at this time.  Plan: RN CM will close case at this time and notify care management assistant.  Jone Baseman, RN, MSN [facility] Care Management Care Management Coordinator Direct Line [phone] Toll Free: [phone]  Fax: [phone]

## 2017-03-17 ENCOUNTER — Ambulatory Visit (INDEPENDENT_AMBULATORY_CARE_PROVIDER_SITE_OTHER): Payer: Medicare Other | Admitting: Family Medicine

## 2017-03-17 ENCOUNTER — Encounter: Payer: Self-pay | Admitting: Family Medicine

## 2017-03-17 VITALS — BP 161/90 | HR 86 | Temp 99.1°F | Resp 16 | Ht 75.0 in | Wt 191.0 lb

## 2017-03-17 DIAGNOSIS — F1994 Other psychoactive substance use, unspecified with psychoactive substance-induced mood disorder: Secondary | ICD-10-CM

## 2017-03-17 DIAGNOSIS — I10 Essential (primary) hypertension: Secondary | ICD-10-CM

## 2017-03-17 DIAGNOSIS — A0472 Enterocolitis due to Clostridium difficile, not specified as recurrent: Secondary | ICD-10-CM

## 2017-03-17 DIAGNOSIS — F10230 Alcohol dependence with withdrawal, uncomplicated: Secondary | ICD-10-CM | POA: Diagnosis not present

## 2017-03-17 DIAGNOSIS — F10930 Alcohol use, unspecified with withdrawal, uncomplicated: Secondary | ICD-10-CM

## 2017-03-17 DIAGNOSIS — R066 Hiccough: Secondary | ICD-10-CM | POA: Diagnosis not present

## 2017-03-17 DIAGNOSIS — E878 Other disorders of electrolyte and fluid balance, not elsewhere classified: Secondary | ICD-10-CM | POA: Diagnosis not present

## 2017-03-17 MED ORDER — BACLOFEN 10 MG PO TABS: 5.0000 mg | ORAL_TABLET | Freq: Three times a day (TID) | ORAL | 1 refills | Status: DC | PRN

## 2017-03-17 NOTE — Patient Instructions (Addendum)
Thank you for coming to the office today.  1. STOP Hydrochlorothiazide  Continue Clonidine 0.2mg  daily for now - may help BP also helps reduce tremors and alcohol side effects  Finish Potassium then STOP and do not need anymore  RESUME Amlodipine 10mg  daily for BP - then after 1-2 weeks if BP still >140/90 may resume Lisinopril  Follow-up with PT as scheduled  Finish antibiotic for C Diff diarrhea.  For Hiccups - Start taking Baclofen (Lioresal) 10mg  (muscle relaxant) - start with half (cut) to one whole pill at night as needed for next 1-3 nights (may make you drowsy, caution with driving) see how it affects you, then if tolerated increase to one pill 2 to 3 times a day or (every 8 hours as needed)   Please schedule a Follow-up Appointment to: Return in about 6 weeks (around 04/28/2017) for HTN, med adjust.    If you have any other questions or concerns, please feel free to call the office or send a message through [place]. You may also schedule an earlier appointment if necessary.  Additionally, you may be receiving a survey about your experience at our office within a few days to 1 week by e-mail or mail. We value your feedback.  [name], DO [facility]

## 2017-03-17 NOTE — Progress Notes (Signed)
Subjective:    Patient ID: John Booker, male    DOB: 1950-07-31, 67 y.o.   MRN: [id number]  John Booker is a 67 y.o. male presenting on 03/17/2017 for Hospitalization Follow-up (dehydration)   [facility] VISIT  Hospital/Location: [facility] Date of Admission: 03/04/17 Date of Discharge: 03/09/17 Transitions of care telephone call: Completed, on 03/11/17 by Tyler Aas LPN  Reason for Admission: Alcohol Intoxication with Suicidal Ideation / C Diff Colitis / Electrolyte Disturbances  - Hospital H&P and Discharge Summary have been reviewed - Patient presents today 8 days after recent hospitalization. Brief summary of recent course, patient had symptoms of dehydration and alcohol intoxication over holidays, he had been sober for 12 years prior, no clear trigger why he started drinking that he would admit to, he presented to ED initially on 12/25 for weakness, later family advised ED that he spoke about "killing himself" he lives at home alone and has access to gun, he had psychiatric evaluation, ultimately admitted to hospital due to diarrhea and weakness with found C Diff colitis, hospitalized, treated with IV Vancomycin, transitioned to PO vancomycin liquid for 14 days complete, also IVF rehydration, doses of KCL mag and K-phos for electrolyte repletion, discharged on K-dur supplement at home. - Today reports overall has done well after discharge. Symptoms of dehydration, intoxication, suicidality have resolved  - Regarding mood and suicidal ideation / substance abuse, he was followed by Psych and Social Work and given specific resources for follow-up, and recommended outpatient therapy, no new medication started - Admits to chronic hiccups for weeks without relief, tried several OTC meds - States diarrhea now resolved, improved PO  - New medications on discharge: Clonidine 0.2mg  daily for reducing EtOH withdrawal symptoms - Changes to current meds on discharge: HCTZ 25mg  was  discontinued. He was holding Amlodipine 10mg   He was referred to Physical Therapy started 03/30/17, he states he lives in his own apartment, and he is able to do all ADLs, he will go to first visit only. He does not want to follow-up with them.  I have reviewed the discharge medication list, and have reconciled the current and discharge medications today.   Current Outpatient Medications:  .  cloNIDine (CATAPRES) 0.2 MG tablet, Take 1 tablet (0.2 mg total) by mouth daily., Disp: 60 tablet, Rfl: 11 .  potassium chloride SA (K-DUR,KLOR-CON) 20 MEQ tablet, , Disp: , Rfl:  .  amLODipine (NORVASC) 10 MG tablet, Take 1 tablet (10 mg total) by mouth daily. (Patient not taking: Reported on 03/17/2017), Disp: 30 tablet, Rfl: 11 .  baclofen (LIORESAL) 10 MG tablet, Take 0.5-1 tablets (5-10 mg total) by mouth 3 (three) times daily as needed for muscle spasms., Disp: 30 each, Rfl: 1 .  feeding supplement, ENSURE ENLIVE, (ENSURE ENLIVE) LIQD, Take 237 mLs by mouth 2 (two) times daily between meals. (Patient not taking: Reported on 03/17/2017), Disp: 237 mL, Rfl: 12 .  lisinopril (PRINIVIL,ZESTRIL) 20 MG tablet, Take 1 tablet (20 mg total) by mouth daily. (Patient not taking: Reported on 03/17/2017), Disp: 30 tablet, Rfl: 6 .  vancomycin (VANCOCIN) 50 mg/mL oral solution, Take 2.5 mLs (125 mg total) by mouth every 6 (six) hours. (Patient not taking: Reported on 03/17/2017), Disp: 140 mL, Rfl: 0  Depression screen [facility] 2/9 03/17/2017 11/15/2015  Decreased Interest 0 0  Down, Depressed, Hopeless 0 0  PHQ - 2 Score 0 0  Altered sleeping 0 -  Tired, decreased energy 0 -  Change in appetite 0 -  Feeling bad or failure about yourself  0 -  Trouble concentrating 0 -  Moving slowly or fidgety/restless 0 -  Suicidal thoughts 0 -  PHQ-9 Score 0 -  Difficult doing work/chores Not difficult at all -    ------------------------------------------------------------------------- Social History   Tobacco Use  . Smoking  status: Former Smoker    Packs/day: 0.50    Years: 38.00    Pack years: 19.00    Types: Cigarettes  . Smokeless tobacco: Never Used  Substance Use Topics  . Alcohol use: No  . Drug use: No    Review of Systems  Constitutional: Negative for activity change, appetite change, chills, diaphoresis, fatigue and fever.  HENT: Negative for congestion and hearing loss.   Eyes: Negative for visual disturbance.  Respiratory: Negative for apnea, cough, chest tightness, shortness of breath and wheezing.   Cardiovascular: Negative for chest pain, palpitations and leg swelling.  Gastrointestinal: Negative for abdominal distention, abdominal pain, anal bleeding, blood in stool, constipation, diarrhea, nausea and vomiting.  Genitourinary: Negative for dysuria, frequency and hematuria.  Musculoskeletal: Negative for arthralgias and neck pain.  Skin: Negative for rash.  Neurological: Negative for dizziness, weakness, light-headedness, numbness and headaches.  Hematological: Negative for adenopathy.  Psychiatric/Behavioral: Negative for behavioral problems, dysphoric mood, self-injury, sleep disturbance and suicidal ideas. The patient is not nervous/anxious.    Per HPI unless specifically indicated above     Objective:    BP (!) 161/90   Pulse 86   Temp 99.1 F (37.3 C) (Oral)   Resp 16   Ht 6\' 3"  (1.905 m)   Wt 191 lb (86.6 kg)   BMI 23.87 kg/m   Wt Readings from Last 3 Encounters:  03/17/17 191 lb (86.6 kg)  03/10/17 186 lb 1.6 oz (84.4 kg)  03/03/17 220 lb (99.8 kg)    Physical Exam  Constitutional: He is oriented to person, place, and time. He appears well-developed and well-nourished. No distress.  Well-appearing, comfortable, cooperative  HENT:  Head: Normocephalic and atraumatic.  Mouth/Throat: Oropharynx is clear and moist.  Eyes: Conjunctivae are normal. Right eye exhibits no discharge. Left eye exhibits no discharge.  Neck: Normal range of motion. Neck supple.    Cardiovascular: Normal rate, regular rhythm, normal heart sounds and intact distal pulses.  No murmur heard. Pulmonary/Chest: Effort normal and breath sounds normal. No respiratory distress. He has no wheezes. He has no rales.  Abdominal: Soft. Bowel sounds are normal. He exhibits no distension and no mass. There is no tenderness. There is no rebound.  Persistent hiccups during visit  Musculoskeletal: Normal range of motion. He exhibits no edema.  Neurological: He is alert and oriented to person, place, and time.  Skin: Skin is warm and dry. No rash noted. He is not diaphoretic. No erythema.  Psychiatric: He has a normal mood and affect. His behavior is normal.  Well groomed, good eye contact, normal speech and thoughts  Nursing note and vitals reviewed.  Results for orders placed or performed during the hospital encounter of 03/04/17  C difficile quick scan w PCR reflex  Result Value Ref Range   C Diff antigen POSITIVE (A) NEGATIVE   C Diff toxin NEGATIVE NEGATIVE   C Diff interpretation Results are indeterminate. See PCR results.   C. Diff by PCR, Reflexed  Result Value Ref Range   Toxigenic C. Difficile by PCR POSITIVE (A) NEGATIVE  CBC  Result Value Ref Range   WBC 5.8 3.8 - 10.6 K/uL   RBC 4.31 (L)  4.40 - 5.90 MIL/uL   Hemoglobin 14.8 13.0 - 18.0 g/dL   HCT 43.1 40.0 - 52.0 %   MCV 100.0 80.0 - 100.0 fL   MCH 34.4 (H) 26.0 - 34.0 pg   MCHC 34.4 32.0 - 36.0 g/dL   RDW 14.1 11.5 - 14.5 %   Platelets 136 (L) 150 - 440 K/uL  Comprehensive metabolic panel  Result Value Ref Range   Sodium 134 (L) 135 - 145 mmol/L   Potassium 3.2 (L) 3.5 - 5.1 mmol/L   Chloride 92 (L) 101 - 111 mmol/L   CO2 30 22 - 32 mmol/L   Glucose, Bld 132 (H) 65 - 99 mg/dL   BUN 18 6 - 20 mg/dL   Creatinine, Ser 1.11 0.61 - 1.24 mg/dL   Calcium 8.6 (L) 8.9 - 10.3 mg/dL   Total Protein 7.1 6.5 - 8.1 g/dL   Albumin 4.3 3.5 - 5.0 g/dL   AST 128 (H) 15 - 41 U/L   ALT 75 (H) 17 - 63 U/L   Alkaline  Phosphatase 120 38 - 126 U/L   Total Bilirubin 2.4 (H) 0.3 - 1.2 mg/dL   GFR calc non Af Amer >60 >60 mL/min   GFR calc Af Amer >60 >60 mL/min   Anion gap 12 5 - 15  Ethanol  Result Value Ref Range   Alcohol, Ethyl (B) <10 <10 mg/dL  Urine Drug Screen, Qualitative ([facility] only)  Result Value Ref Range   Tricyclic, Ur Screen NONE DETECTED NONE DETECTED   Amphetamines, Ur Screen NONE DETECTED NONE DETECTED   MDMA (Ecstasy)Ur Screen NONE DETECTED NONE DETECTED   Cocaine Metabolite,Ur [place] NONE DETECTED NONE DETECTED   Opiate, Ur Screen NONE DETECTED NONE DETECTED   Phencyclidine (PCP) Ur S NONE DETECTED NONE DETECTED   Cannabinoid 50 Ng, Ur [place] NONE DETECTED NONE DETECTED   Barbiturates, Ur Screen POSITIVE (A) NONE DETECTED   Benzodiazepine, Ur Scrn NONE DETECTED NONE DETECTED   Methadone Scn, Ur NONE DETECTED NONE DETECTED  TSH  Result Value Ref Range   TSH 2.870 0.350 - 4.500 uIU/mL  Magnesium  Result Value Ref Range   Magnesium 1.4 (L) 1.7 - 2.4 mg/dL  Basic metabolic panel  Result Value Ref Range   Sodium 131 (L) 135 - 145 mmol/L   Potassium 2.8 (L) 3.5 - 5.1 mmol/L   Chloride 98 (L) 101 - 111 mmol/L   CO2 27 22 - 32 mmol/L   Glucose, Bld 120 (H) 65 - 99 mg/dL   BUN 9 6 - 20 mg/dL   Creatinine, Ser 0.78 0.61 - 1.24 mg/dL   Calcium 8.0 (L) 8.9 - 10.3 mg/dL   GFR calc non Af Amer >60 >60 mL/min   GFR calc Af Amer >60 >60 mL/min   Anion gap 6 5 - 15  Phosphorus  Result Value Ref Range   Phosphorus 1.3 (L) 2.5 - 4.6 mg/dL  Basic metabolic panel  Result Value Ref Range   Sodium 133 (L) 135 - 145 mmol/L   Potassium 3.4 (L) 3.5 - 5.1 mmol/L   Chloride 100 (L) 101 - 111 mmol/L   CO2 27 22 - 32 mmol/L   Glucose, Bld 96 65 - 99 mg/dL   BUN 11 6 - 20 mg/dL   Creatinine, Ser 0.66 0.61 - 1.24 mg/dL   Calcium 8.9 8.9 - 10.3 mg/dL   GFR calc non Af Amer >60 >60 mL/min   GFR calc Af Amer >60 >60 mL/min   Anion gap 6  5 - 15  Magnesium  Result Value Ref Range   Magnesium 1.8 1.7 -  2.4 mg/dL  Magnesium  Result Value Ref Range   Magnesium 1.8 1.7 - 2.4 mg/dL  Phosphorus  Result Value Ref Range   Phosphorus 2.0 (L) 2.5 - 4.6 mg/dL  Potassium  Result Value Ref Range   Potassium 3.1 (L) 3.5 - 5.1 mmol/L  Magnesium  Result Value Ref Range   Magnesium 1.9 1.7 - 2.4 mg/dL  Potassium  Result Value Ref Range   Potassium 3.0 (L) 3.5 - 5.1 mmol/L  Phosphorus  Result Value Ref Range   Phosphorus 4.2 2.5 - 4.6 mg/dL  Potassium  Result Value Ref Range   Potassium 4.1 3.5 - 5.1 mmol/L  Magnesium  Result Value Ref Range   Magnesium 1.9 1.7 - 2.4 mg/dL      Assessment & Plan:   Problem List Items Addressed This Visit    Alcohol withdrawal ([place])    Concern with history of EtOH withdrawal, prior sober >12 years, now alcohol abuse again intoxication hospitalization - Currently reportedly sober again after hospitalization, no evidence of complicated withdrawal - Exam benign today no tremors or other symptoms, hemodynamic stable - On Clonidine - He was given substance abuse resources in hospital - Return criteria given      HTN (hypertension) - Primary    Remains poorly controlled still elevated, presume due to alcohol abuse history and recent illness. OFF anti-hypertension meds from hospital Home readings >160/80-90 - consistent - Remains concern for resistant HTN - OFF HCTZ due to dehydration electrolyte imbalance  Plan: 1. Advised him to resume Lisinopril 20mg  daily, Amlodipine 10mg  daily - and may continue Clonidine 0.2mg  daily for now (likely only for EtOH w/d) - Note he did not follow-up on med adjust from previous visit, was lost to follow-up for up to 1 year almost in 2018 2. CHeck chemistry CMET and Mag today with prior electrolyte disturbance 3. Follow-up 6 weeks HTN med adjust - may add back thiazide, consider referral to cardiology or nephro for 2nd opinion on resistant HTN if not improving      Relevant Orders   COMPLETE METABOLIC PANEL WITH GFR    Substance induced mood disorder ([place])    Suspected mood / substance abuse related, reviewed hospitalization Now remains off alcohol Denies any depression or anxiety or other provoking factors No longer followed by Psych, not on SSRI or other med Denies any suicidal ideation Close monitoring       Other Visit Diagnoses    Electrolyte imbalance       Check CMET, Mag. Finish potassium supplment from hospital, resume ACEi, hold HCTZ   Relevant Orders   COMPLETE METABOLIC PANEL WITH GFR   Magnesium   C. difficile colitis       Resolving, asymptomatic now, finish Vanc liquid PO total 14 days   Hiccups       Chronic hiccups present prior to hospital, may be related to alcohol or other etiology, trial baclofen PRN   Relevant Medications   baclofen (LIORESAL) 10 MG tablet       Meds ordered this encounter  Medications  . baclofen (LIORESAL) 10 MG tablet    Sig: Take 0.5-1 tablets (5-10 mg total) by mouth 3 (three) times daily as needed for muscle spasms.    Dispense:  30 each    Refill:  1    Follow up plan: Return in about 6 weeks (around 04/28/2017) for HTN, med adjust.  Sheppard Coil  Par[employer] Ranger, [facility] 03/18/2017, 12:07 AM

## 2017-03-18 LAB — COMPLETE METABOLIC PANEL WITH GFR
AG Ratio: 1.7 (calc) (ref 1.0–2.5)
ALBUMIN MSPROF: 4.2 g/dL (ref 3.6–5.1)
ALT: 92 U/L — ABNORMAL HIGH (ref 9–46)
AST: 54 U/L — AB (ref 10–35)
Alkaline phosphatase (APISO): 80 U/L (ref 40–115)
BUN: 15 mg/dL (ref 7–25)
CALCIUM: 10.1 mg/dL (ref 8.6–10.3)
CO2: 29 mmol/L (ref 20–32)
CREATININE: 1.16 mg/dL (ref 0.70–1.25)
Chloride: 101 mmol/L (ref 98–110)
GFR, Est African American: 75 mL/min/{1.73_m2} (ref >=60)
GFR, Est Non African American: 64 mL/min/{1.73_m2} (ref >=60)
GLUCOSE: 108 mg/dL (ref 65–139)
Globulin: 2.5 g/dL (calc) (ref 1.9–3.7)
Potassium: 4.8 mmol/L (ref 3.5–5.3)
Sodium: 137 mmol/L (ref 135–146)
Total Bilirubin: 0.5 mg/dL (ref 0.2–1.2)
Total Protein: 6.7 g/dL (ref 6.1–8.1)

## 2017-03-18 LAB — MAGNESIUM: Magnesium: 1.8 mg/dL (ref 1.5–2.5)

## 2017-03-18 NOTE — Assessment & Plan Note (Signed)
Suspected mood / substance abuse related, reviewed hospitalization Now remains off alcohol Denies any depression or anxiety or other provoking factors No longer followed by Psych, not on SSRI or other med Denies any suicidal ideation Close monitoring

## 2017-03-18 NOTE — Assessment & Plan Note (Addendum)
Concern with history of EtOH withdrawal, prior sober >12 years, now alcohol abuse again intoxication hospitalization - Currently reportedly sober again after hospitalization, no evidence of complicated withdrawal - Exam benign today no tremors or other symptoms, hemodynamic stable - On Clonidine - He was given substance abuse resources in hospital - Return criteria given

## 2017-03-18 NOTE — Assessment & Plan Note (Signed)
Remains poorly controlled still elevated, presume due to alcohol abuse history and recent illness. OFF anti-hypertension meds from hospital Home readings >160/80-90 - consistent - Remains concern for resistant HTN - OFF HCTZ due to dehydration electrolyte imbalance  Plan: 1. Advised him to resume Lisinopril 20mg  daily, Amlodipine 10mg  daily - and may continue Clonidine 0.2mg  daily for now (likely only for EtOH w/d) - Note he did not follow-up on med adjust from previous visit, was lost to follow-up for up to 1 year almost in 2018 2. CHeck chemistry CMET and Mag today with prior electrolyte disturbance 3. Follow-up 6 weeks HTN med adjust - may add back thiazide, consider referral to cardiology or nephro for 2nd opinion on resistant HTN if not improving

## 2017-03-30 ENCOUNTER — Ambulatory Visit: Payer: Medicare Other

## 2017-04-01 ENCOUNTER — Ambulatory Visit: Payer: Medicare Other

## 2017-05-08 ENCOUNTER — Other Ambulatory Visit: Payer: Self-pay | Admitting: Family Medicine

## 2017-05-08 ENCOUNTER — Encounter: Payer: Self-pay | Admitting: Family Medicine

## 2017-05-08 ENCOUNTER — Ambulatory Visit: Payer: Medicare Other | Admitting: Family Medicine

## 2017-05-08 VITALS — BP 123/60 | HR 76 | Temp 98.5°F | Resp 16 | Ht 75.0 in | Wt 219.0 lb

## 2017-05-08 DIAGNOSIS — R7309 Other abnormal glucose: Secondary | ICD-10-CM

## 2017-05-08 DIAGNOSIS — R600 Localized edema: Secondary | ICD-10-CM

## 2017-05-08 DIAGNOSIS — E559 Vitamin D deficiency, unspecified: Secondary | ICD-10-CM

## 2017-05-08 DIAGNOSIS — F1010 Alcohol abuse, uncomplicated: Secondary | ICD-10-CM

## 2017-05-08 DIAGNOSIS — E7889 Other lipoprotein metabolism disorders: Secondary | ICD-10-CM

## 2017-05-08 DIAGNOSIS — Z125 Encounter for screening for malignant neoplasm of prostate: Secondary | ICD-10-CM

## 2017-05-08 DIAGNOSIS — Z0000 Encounter for general adult medical examination without abnormal findings: Secondary | ICD-10-CM

## 2017-05-08 DIAGNOSIS — I10 Essential (primary) hypertension: Secondary | ICD-10-CM

## 2017-05-08 MED ORDER — LISINOPRIL 40 MG PO TABS: 40.0000 mg | ORAL_TABLET | Freq: Every day | ORAL | 3 refills | Status: DC

## 2017-05-08 MED ORDER — AMLODIPINE BESYLATE 5 MG PO TABS: 5.0000 mg | ORAL_TABLET | Freq: Every day | ORAL | 3 refills | Status: DC

## 2017-05-08 NOTE — Assessment & Plan Note (Signed)
Dramatically improved BP control back on meds He has very poor health literacy has difficulty comprehending his rx and does not know what he is taking, difficulty understanding instructions during visit today No home readings today - OFF HCTZ due to dehydration electrolyte imbalance - Unsure if on Clonidine 0.2mg  daily - this was from hospital for alcohol w/d symptoms  Plan: 1. Due to swelling - likely side effect of Amlodipine 10mg  - New rx - REDUCE dose from 10 to 5 - sent Amlodipine 5mg  daily to pharmacy today - New rx INCREASE dose Lisinopril from 20 to 40mg  - sent Lisinopril 40mg  daily to pharmacy today - Stop clonidine 0.2 if has this rx at home when he checks bottles 2. Follow-up sooner within 4-6 weeks if not improved swelling - Otherwise return 3 months for annual + labs - future may need thiazide back on board if still swelling

## 2017-05-08 NOTE — Progress Notes (Signed)
Subjective:    Patient ID: John Booker, male    DOB: 1950/10/19, 67 y.o.   MRN: [id number]  John Booker is a 67 y.o. male presenting on 05/08/2017 for Hypertension   HPI   CHRONIC HTN / Bilateral Lower Extremity Edema Reports he does not know which BP medicines he is taking, he says taking 2 pills daily at home but does not know which. Last visit 03/17/17 he was seen after being in hospital for alcohol withdrawal. He was placed on Clonidine 0.2mg  daily per hospital but unsure if he is actually taking this or ever started. - he was DC'd on HCTZ 25mg  at that time due to electrolytes - Started back on Amlodipine 10mg    Today he is pleased with improved BP. He does not check Bp regularly outside office Current Meds - Amlodipine 10mg  daily, Lisinopril 20mg  daily - he does not recall if taking Clonidine 0.2mg  daily   Reports good compliance, took meds today. Tolerating well, w/o complaints. Lifestyle: - Diet: unchanged, remains alcohol free, he drinks some water, balanced diet unsure on salt content of foods - Exercise: limited - Admits bilateral foot swelling R>L, non painful, no erythema, noticed worse x 1 week now  Denies CP, dyspnea, HA, edema, dizziness / lightheadedness, leg pain or redness   Depression screen [facility] 2/9 05/08/2017 03/17/2017 11/15/2015  Decreased Interest 0 0 0  Down, Depressed, Hopeless 0 0 0  PHQ - 2 Score 0 0 0  Altered sleeping 0 0 -  Tired, decreased energy 0 0 -  Change in appetite 0 0 -  Feeling bad or failure about yourself  0 0 -  Trouble concentrating 0 0 -  Moving slowly or fidgety/restless 0 0 -  Suicidal thoughts 0 0 -  PHQ-9 Score 0 0 -  Difficult doing work/chores Not difficult at all Not difficult at all -    Social History   Tobacco Use  . Smoking status: Former Smoker    Packs/day: 0.50    Years: 38.00    Pack years: 19.00    Types: Cigarettes  . Smokeless tobacco: Never Used  Substance Use Topics  . Alcohol use: No  . Drug use: No     Review of Systems Per HPI unless specifically indicated above     Objective:    BP 123/60   Pulse 76   Temp 98.5 F (36.9 C) (Oral)   Resp 16   Ht 6\' 3"  (1.905 m)   Wt 219 lb (99.3 kg)   BMI 27.37 kg/m   Wt Readings from Last 3 Encounters:  05/08/17 219 lb (99.3 kg)  03/17/17 191 lb (86.6 kg)  03/10/17 186 lb 1.6 oz (84.4 kg)    Physical Exam  Constitutional: He is oriented to person, place, and time. He appears well-developed and well-nourished. No distress.  Well-appearing, comfortable, cooperative  HENT:  Head: Normocephalic and atraumatic.  Mouth/Throat: Oropharynx is clear and moist.  Eyes: Conjunctivae are normal. Right eye exhibits no discharge. Left eye exhibits no discharge.  Neck: Normal range of motion. Neck supple. No thyromegaly present.  Cardiovascular: Normal rate, regular rhythm, normal heart sounds and intact distal pulses.  No murmur heard. Pulmonary/Chest: Effort normal and breath sounds normal. No respiratory distress. He has no wheezes. He has no rales.  Abdominal:  Still some persistent hiccups, less severe  Musculoskeletal: Normal range of motion. He exhibits edema (bilateral foot ankle and lower leg pitting edema +2 R>L, non tender no weeping or oozing).  Lymphadenopathy:    He has no cervical adenopathy.  Neurological: He is alert and oriented to person, place, and time.  Skin: Skin is warm and dry. No rash noted. He is not diaphoretic. No erythema.  Chronic skin changes lower extremities with some discoloration and spider veins with varicose veins, several areas of callus formation and dry skin on feet  Psychiatric: He has a normal mood and affect. His behavior is normal.  Well groomed, good eye contact, normal speech and thoughts  Nursing note and vitals reviewed.  Results for orders placed or performed in visit on 03/17/17  COMPLETE METABOLIC PANEL WITH GFR  Result Value Ref Range   Glucose, Bld 108 65 - 139 mg/dL   BUN 15 7 - 25 mg/dL    Creat 1.16 0.70 - 1.25 mg/dL   GFR, Est Non African American 64 > OR = 60 mL/min/1.27m2   GFR, Est African American 75 > OR = 60 mL/min/1.72m2   BUN/Creatinine Ratio NOT APPLICABLE 6 - 22 (calc)   Sodium 137 135 - 146 mmol/L   Potassium 4.8 3.5 - 5.3 mmol/L   Chloride 101 98 - 110 mmol/L   CO2 29 20 - 32 mmol/L   Calcium 10.1 8.6 - 10.3 mg/dL   Total Protein 6.7 6.1 - 8.1 g/dL   Albumin 4.2 3.6 - 5.1 g/dL   Globulin 2.5 1.9 - 3.7 g/dL (calc)   AG Ratio 1.7 1.0 - 2.5 (calc)   Total Bilirubin 0.5 0.2 - 1.2 mg/dL   Alkaline phosphatase (APISO) 80 40 - 115 U/L   AST 54 (H) 10 - 35 U/L   ALT 92 (H) 9 - 46 U/L  Magnesium  Result Value Ref Range   Magnesium 1.8 1.5 - 2.5 mg/dL      Assessment & Plan:   Problem List Items Addressed This Visit    Bilateral lower extremity edema    Suspected bilateral LE edema is multifactorial, likely secondary now to amlodipine 10mg  daily, he has similar reaction to nifedipine but he did not recall this R>L, without sign of acute infection or localized swelling, no injury, no history to support DVT, Well's score 0 for DVT History of varicose veins and spider veins, former smoker, suspect likely related to vascular history may have venous insufficiency. Lastly could be related to alcohol as well.  Plan - Reduce Amlodipine from 10 to 5mg  daily - goal to reduce swelling - Remain off HCTZ for now - RICE reviewed compression elevation toes above heart level - Follow-up return criteria given 4-6 weeks if not improved - may need vascular referral  Otherwise return 3 mo for annual + labs, and would consider future other testing ECHO etc if need      HTN (hypertension) - Primary    Dramatically improved BP control back on meds He has very poor health literacy has difficulty comprehending his rx and does not know what he is taking, difficulty understanding instructions during visit today No home readings today - OFF HCTZ due to dehydration electrolyte  imbalance - Unsure if on Clonidine 0.2mg  daily - this was from hospital for alcohol w/d symptoms  Plan: 1. Due to swelling - likely side effect of Amlodipine 10mg  - New rx - REDUCE dose from 10 to 5 - sent Amlodipine 5mg  daily to pharmacy today - New rx INCREASE dose Lisinopril from 20 to 40mg  - sent Lisinopril 40mg  daily to pharmacy today - Stop clonidine 0.2 if has this rx at home when he checks bottles  2. Follow-up sooner within 4-6 weeks if not improved swelling - Otherwise return 3 months for annual + labs - future may need thiazide back on board if still swelling      Relevant Medications   amLODipine (NORVASC) 5 MG tablet   lisinopril (PRINIVIL,ZESTRIL) 40 MG tablet      Meds ordered this encounter  Medications  . amLODipine (NORVASC) 5 MG tablet    Sig: Take 1 tablet (5 mg total) by mouth daily.    Dispense:  30 tablet    Refill:  3    Dose change  . lisinopril (PRINIVIL,ZESTRIL) 40 MG tablet    Sig: Take 1 tablet (40 mg total) by mouth daily.    Dispense:  30 tablet    Refill:  3    Dose change     Follow up plan: Return in about 3 months (around 08/08/2017) for Annual Physical.  Future labs ordered for 08/2017   [name], [facility] 05/08/2017, 2:04 PM

## 2017-05-08 NOTE — Patient Instructions (Addendum)
Thank you for coming to the office today.  1.  Double check medicine bottles at home  You should have been taking for blood pressure - Amlodipine 10mg  daily - Lisinopril 20mg  daily  NOW NEW MEDICINE SENT TODAY - Amlodipine 5mg  daily - Lisinopril 40mg  daily  (new pills - dose change) - stop old pills  3rd pill we mentioned that was given to you by Hospital - for alcohol withdrawal symptoms and blood pressure - Clonidine 0.2mg  daily - I would STOP taking this one if you have it, and we will discontinue   If symptoms do not improve - return in 4-6 weeks  If resolve and improve and BP is better then we can schedule 3 months for Annual Physical and blood work.  DUE for FASTING BLOOD WORK (no food or drink after midnight before the lab appointment, only water or coffee without cream/sugar on the morning of)  SCHEDULE "Lab Only" visit in the morning at the clinic for lab draw in 3 MONTHS   - Make sure Lab Only appointment is at about 1 week before your next appointment, so that results will be available  For Lab Results, once available within 2-3 days of blood draw, you can can log in to MyChart online to view your results and a brief explanation. Also, we can discuss results at next follow-up visit.   Please schedule a Follow-up Appointment to: Return in about 3 months (around 08/08/2017) for Annual Physical.    If you have any other questions or concerns, please feel free to call the office or send a message through [place]. You may also schedule an earlier appointment if necessary.  Additionally, you may be receiving a survey about your experience at our office within a few days to 1 week by e-mail or mail. We value your feedback.  [name], DO [place]

## 2017-05-08 NOTE — Assessment & Plan Note (Signed)
Suspected bilateral LE edema is multifactorial, likely secondary now to amlodipine 10mg  daily, he has similar reaction to nifedipine but he did not recall this R>L, without sign of acute infection or localized swelling, no injury, no history to support DVT, Well's score 0 for DVT History of varicose veins and spider veins, former smoker, suspect likely related to vascular history may have venous insufficiency. Lastly could be related to alcohol as well.  Plan - Reduce Amlodipine from 10 to 5mg  daily - goal to reduce swelling - Remain off HCTZ for now - RICE reviewed compression elevation toes above heart level - Follow-up return criteria given 4-6 weeks if not improved - may need vascular referral  Otherwise return 3 mo for annual + labs, and would consider future other testing ECHO etc if need

## 2017-06-16 ENCOUNTER — Other Ambulatory Visit: Payer: Self-pay

## 2017-06-16 ENCOUNTER — Encounter: Payer: Self-pay | Admitting: Family Medicine

## 2017-06-16 ENCOUNTER — Ambulatory Visit: Payer: Medicare Other | Admitting: Family Medicine

## 2017-06-16 VITALS — BP 151/76 | HR 91 | Temp 98.3°F | Ht 75.0 in | Wt 228.0 lb

## 2017-06-16 DIAGNOSIS — R600 Localized edema: Secondary | ICD-10-CM | POA: Diagnosis not present

## 2017-06-16 DIAGNOSIS — I10 Essential (primary) hypertension: Secondary | ICD-10-CM

## 2017-06-16 MED ORDER — HYDROCHLOROTHIAZIDE 25 MG PO TABS: ORAL_TABLET | ORAL | 2 refills | Status: DC

## 2017-06-16 NOTE — Assessment & Plan Note (Signed)
Suspected bilateral LE edema is multifactorial, likely secondary now to amlodipine still even on 5mg  dose, he had prior reaction to other CCB Adalat before. - R>L, without sign of acute infection or localized swelling, no injury, no history to support DVT, Well's score 0 for DVT - History of varicose veins and spider veins, former smoker, suspect likely related to vascular history may have venous insufficiency. Lastly could be related to alcohol as well, however he remains alcohol free by his report. - Since persistent will consider other options such as Cardiac may need ECHO in future and possibly Abdominal RUQ [place] for liver eval given history of alcohol use and elevated LFTs in past  Plan DISCONTINUE Amlodipine 5mg  all together - RESTART HCTZ 12.5 to 25mg  - see A&P - Continue Lisinopril 40mg  daily - May continue PRN RICE reviewed compression elevation toes above heart level - Follow-up return criteria given 4-6 weeks if not improved - will consider ECHO, RUQ [place] or Vascular

## 2017-06-16 NOTE — Patient Instructions (Addendum)
Thank you for coming to the office today.  STOP Amlodipine 5mg  - this likely is causing the swelling Similar class to the Adalat medicine that caused your swelling  RESTART Hydrochlorothiazide - take HALF pill for dose of 12.5mg  daily for blood pressure  Check BP regularly at home, if at goal < 140 / 90, then can continue half tab, if not at goal, then increase to WHOLE tablet daily  In future we may need to check heart test - ECHOcardiogram to check heart function and possibly Liver ultrasound to see if any other cause for swelling  DUE for FASTING BLOOD WORK (no food or drink after midnight before the lab appointment, only water or coffee without cream/sugar on the morning of)  SCHEDULE "Lab Only" visit in the morning at the clinic for lab draw in 4 WEEKS   - Make sure Lab Only appointment is at about 1 week before your next appointment, so that results will be available  For Lab Results, once available within 2-3 days of blood draw, you can can log in to MyChart online to view your results and a brief explanation. Also, we can discuss results at next follow-up visit.   Please schedule a Follow-up Appointment to: Return in about 1 month (around 07/14/2017) for HTN, Swelling, Med adjust, lab result.  If you have any other questions or concerns, please feel free to call the office or send a message through [place]. You may also schedule an earlier appointment if necessary.  Additionally, you may be receiving a survey about your experience at our office within a few days to 1 week by e-mail or mail. We value your feedback.  [name], DO [place]

## 2017-06-16 NOTE — Assessment & Plan Note (Signed)
Elevated today again, despite previous improvement. Seems to have some labile BP, previously very resistant HTN. No home readings available with him today. Prior meds: Failed Adalat (CCB, swelling), HCTZ (stopped due to Hyponatremia, electrolyte disturbance but tolerated well), Clonidine 0.2 stopped use was only PRN for alcohol withdrawal from hospital  Plan - Discussion on optimizing HTN management and reducing side effects, he seems to be very sensitive to multiple BP meds, and it has been challenging finding right combination for him - DISCONTINUE Amlodipine 5mg  now all together - Suspect Amlodipine is cause of his swelling as CCB - START back on HCTZ - dose = 12.5mg  (HALF of 25mg  tab), may take half pill for 1-2 weeks, monitor BP if well controlled then may continue on this and we can send new rx for 12.5, otherwise if not controlled BP then will need to increase to whole pill 25mg  daily - Continue Lisinopril 40mg  daily - Check BMET in 4 weeks on HCTZ - Follow-up 4-6 weeks review BP meds, lab, and swelling

## 2017-06-16 NOTE — Progress Notes (Signed)
Subjective:    Patient ID: John Booker, male    DOB: [birth year], 67 y.o.   MRN: [id number]  John Booker is a 67 y.o. male presenting on 06/16/2017 for Foot Swelling (swelling of both feet)   HPI   FOLLOW-UP CHRONIC HTN / Bilateral Lower Extremity Edema - Last visit with me 05/08/17, for same problem with HTN and Leg Swelling, at that time treated with REDUCED dose Amlodipine from 10 to 5mg  and also increase Lisinopril from 20 to 40mg  to compensate since he has had hard to control BP, additional background information he used to be on HCTZ 25mg  in past, this was stopped back in 2018 due to Hyponatremia, however he had taken this med for years before without problem, see prior notes for background information. - Interval update with his leg swelling did not improve, it may have gotten worsen since last visit, the lower dose Amlodipine did not help reduce swelling. He also reports he has history of intolerance to a prior BP med Adalat that caused the SAME leg swelling, this is a CCB medicine as well. He did not report this last time.  - Today patient reports swelling persistent, R > L, has difficulty getting shoes to fit, it bothers him but does not cause significant pain. - Tries to elevated and use compression without any improvement most of the time - Current Meds - Amlodipine 5mg  daily, Lisinopril 40mg  daily - He admits some increased urination on this combination, he was not urinating as much back on the HCTZ Took meds today Lifestyle: - Diet: unchanged, remains alcohol free, he drinks some water, balanced diet unsure on salt content of foods - Exercise: limited - He has not had testing for other causes of swelling before, never seen Cardiologist, has not had ECHO, has not seen vascular or has not had Liver testing or imaging, he has had some elevated LFTs before Denies CP, dyspnea, HA, dizziness / lightheadedness, leg pain or redness, numbness tingling weakness   Depression screen [facility]  2/9 06/16/2017 05/08/2017 03/17/2017  Decreased Interest 0 0 0  Down, Depressed, Hopeless 0 0 0  PHQ - 2 Score 0 0 0  Altered sleeping 0 0 0  Tired, decreased energy 0 0 0  Change in appetite 0 0 0  Feeling bad or failure about yourself  0 0 0  Trouble concentrating 0 0 0  Moving slowly or fidgety/restless 0 0 0  Suicidal thoughts 0 0 0  PHQ-9 Score 0 0 0  Difficult doing work/chores Not difficult at all Not difficult at all Not difficult at all    Social History   Tobacco Use  . Smoking status: Former Smoker    Packs/day: 0.50    Years: 38.00    Pack years: 19.00    Types: Cigarettes  . Smokeless tobacco: Never Used  Substance Use Topics  . Alcohol use: No  . Drug use: No    Review of Systems Per HPI unless specifically indicated above     Objective:    BP (!) 151/76 (BP Location: Left Arm, Patient Position: Sitting, Cuff Size: Normal)   Pulse 91   Temp 98.3 F (36.8 C)   Ht 6\' 3"  (1.905 m)   Wt 228 lb (103.4 kg)   BMI 28.50 kg/m   Wt Readings from Last 3 Encounters:  06/16/17 228 lb (103.4 kg)  05/08/17 219 lb (99.3 kg)  03/17/17 191 lb (86.6 kg)    Physical Exam  Constitutional: He is  oriented to person, place, and time. He appears well-developed and well-nourished. No distress.  Well-appearing, comfortable, cooperative  HENT:  Head: Normocephalic and atraumatic.  Mouth/Throat: Oropharynx is clear and moist.  Eyes: Conjunctivae are normal. Right eye exhibits no discharge. Left eye exhibits no discharge.  Neck: Normal range of motion. Neck supple.  Cardiovascular: Normal rate, regular rhythm, normal heart sounds and intact distal pulses.  No murmur heard. Pulmonary/Chest: Effort normal. No respiratory distress.  Musculoskeletal: Normal range of motion. He exhibits edema (persistent vs worse bilateral foot ankle and lower leg pitting edema +2 R>L, non tender no weeping or oozing or erythema).  Neurological: He is alert and oriented to person, place, and time.    Skin: Skin is warm and dry. No rash noted. He is not diaphoretic. No erythema.  Psychiatric: He has a normal mood and affect. His behavior is normal.  Well groomed, good eye contact, normal speech and thoughts  Nursing note and vitals reviewed.  Results for orders placed or performed in visit on 03/17/17  COMPLETE METABOLIC PANEL WITH GFR  Result Value Ref Range   Glucose, Bld 108 65 - 139 mg/dL   BUN 15 7 - 25 mg/dL   Creat 1.16 0.70 - 1.25 mg/dL   GFR, Est Non African American 64 > OR = 60 mL/min/1.71m2   GFR, Est African American 75 > OR = 60 mL/min/1.36m2   BUN/Creatinine Ratio NOT APPLICABLE 6 - 22 (calc)   Sodium 137 135 - 146 mmol/L   Potassium 4.8 3.5 - 5.3 mmol/L   Chloride 101 98 - 110 mmol/L   CO2 29 20 - 32 mmol/L   Calcium 10.1 8.6 - 10.3 mg/dL   Total Protein 6.7 6.1 - 8.1 g/dL   Albumin 4.2 3.6 - 5.1 g/dL   Globulin 2.5 1.9 - 3.7 g/dL (calc)   AG Ratio 1.7 1.0 - 2.5 (calc)   Total Bilirubin 0.5 0.2 - 1.2 mg/dL   Alkaline phosphatase (APISO) 80 40 - 115 U/L   AST 54 (H) 10 - 35 U/L   ALT 92 (H) 9 - 46 U/L  Magnesium  Result Value Ref Range   Magnesium 1.8 1.5 - 2.5 mg/dL      Assessment & Plan:   Problem List Items Addressed This Visit    Bilateral lower extremity edema    Suspected bilateral LE edema is multifactorial, likely secondary now to amlodipine still even on 5mg  dose, he had prior reaction to other CCB Adalat before. - R>L, without sign of acute infection or localized swelling, no injury, no history to support DVT, Well's score 0 for DVT - History of varicose veins and spider veins, former smoker, suspect likely related to vascular history may have venous insufficiency. Lastly could be related to alcohol as well, however he remains alcohol free by his report. - Since persistent will consider other options such as Cardiac may need ECHO in future and possibly Abdominal RUQ [place] for liver eval given history of alcohol use and elevated LFTs in  past  Plan DISCONTINUE Amlodipine 5mg  all together - RESTART HCTZ 12.5 to 25mg  - see A&P - Continue Lisinopril 40mg  daily - May continue PRN RICE reviewed compression elevation toes above heart level - Follow-up return criteria given 4-6 weeks if not improved - will consider ECHO, RUQ [place] or Vascular      Relevant Medications   hydrochlorothiazide (HYDRODIURIL) 25 MG tablet   HTN (hypertension) - Primary    Elevated today again, despite previous improvement. Seems to  have some labile BP, previously very resistant HTN. No home readings available with him today. Prior meds: Failed Adalat (CCB, swelling), HCTZ (stopped due to Hyponatremia, electrolyte disturbance but tolerated well), Clonidine 0.2 stopped use was only PRN for alcohol withdrawal from hospital  Plan - Discussion on optimizing HTN management and reducing side effects, he seems to be very sensitive to multiple BP meds, and it has been challenging finding right combination for him - DISCONTINUE Amlodipine 5mg  now all together - Suspect Amlodipine is cause of his swelling as CCB - START back on HCTZ - dose = 12.5mg  (HALF of 25mg  tab), may take half pill for 1-2 weeks, monitor BP if well controlled then may continue on this and we can send new rx for 12.5, otherwise if not controlled BP then will need to increase to whole pill 25mg  daily - Continue Lisinopril 40mg  daily - Check BMET in 4 weeks on HCTZ - Follow-up 4-6 weeks review BP meds, lab, and swelling      Relevant Medications   hydrochlorothiazide (HYDRODIURIL) 25 MG tablet   Other Relevant Orders   BASIC METABOLIC PANEL WITH GFR      Meds ordered this encounter  Medications  . hydrochlorothiazide (HYDRODIURIL) 25 MG tablet    Sig: Start half pill (dose 12.5mg ) daily for 1-2 weeks, if BP still elevated, inc to 1 whole pill (25mg )    Dispense:  30 tablet    Refill:  2    Follow up plan: Return in about 1 month (around 07/14/2017) for HTN, Swelling, Med adjust, lab  result.  Future lab already ordered for 4 weeks, BMET  [name], [facility] 06/16/2017, 1:41 PM

## 2017-07-10 ENCOUNTER — Telehealth: Payer: Self-pay | Admitting: Family Medicine

## 2017-07-10 NOTE — Telephone Encounter (Signed)
Left message regarding need to schedule MWV w [facility] when he comes for labs on 07/14/17.  No previous AWV noted

## 2017-07-14 ENCOUNTER — Other Ambulatory Visit: Payer: Self-pay | Admitting: Family Medicine

## 2017-07-14 ENCOUNTER — Encounter: Payer: Self-pay | Admitting: Family Medicine

## 2017-07-14 ENCOUNTER — Ambulatory Visit: Payer: Medicare Other | Admitting: Family Medicine

## 2017-07-14 ENCOUNTER — Other Ambulatory Visit: Payer: Medicare Other

## 2017-07-14 VITALS — BP 118/63 | HR 74 | Temp 98.6°F | Resp 16 | Ht 75.0 in | Wt 222.0 lb

## 2017-07-14 DIAGNOSIS — R7309 Other abnormal glucose: Secondary | ICD-10-CM

## 2017-07-14 DIAGNOSIS — F1021 Alcohol dependence, in remission: Secondary | ICD-10-CM

## 2017-07-14 DIAGNOSIS — I10 Essential (primary) hypertension: Secondary | ICD-10-CM

## 2017-07-14 DIAGNOSIS — R600 Localized edema: Secondary | ICD-10-CM

## 2017-07-14 DIAGNOSIS — Z1159 Encounter for screening for other viral diseases: Secondary | ICD-10-CM

## 2017-07-14 DIAGNOSIS — R066 Hiccough: Secondary | ICD-10-CM | POA: Diagnosis not present

## 2017-07-14 DIAGNOSIS — R142 Eructation: Secondary | ICD-10-CM | POA: Diagnosis not present

## 2017-07-14 DIAGNOSIS — Z0000 Encounter for general adult medical examination without abnormal findings: Secondary | ICD-10-CM

## 2017-07-14 DIAGNOSIS — R351 Nocturia: Secondary | ICD-10-CM

## 2017-07-14 LAB — BASIC METABOLIC PANEL WITH GFR
BUN / CREAT RATIO: 18 (calc) (ref 6–22)
BUN: 25 mg/dL (ref 7–25)
CALCIUM: 10.2 mg/dL (ref 8.6–10.3)
CHLORIDE: 102 mmol/L (ref 98–110)
CO2: 28 mmol/L (ref 20–32)
Creat: 1.4 mg/dL — ABNORMAL HIGH (ref 0.70–1.25)
GFR, Est African American: 59 mL/min/{1.73_m2} — ABNORMAL LOW (ref >=60)
GFR, Est Non African American: 51 mL/min/{1.73_m2} — ABNORMAL LOW (ref >=60)
GLUCOSE: 94 mg/dL (ref 65–99)
Potassium: 5.1 mmol/L (ref 3.5–5.3)
Sodium: 136 mmol/L (ref 135–146)

## 2017-07-14 MED ORDER — LISINOPRIL 40 MG PO TABS: 40.0000 mg | ORAL_TABLET | Freq: Every day | ORAL | 3 refills | Status: DC

## 2017-07-14 MED ORDER — HYDROCHLOROTHIAZIDE 25 MG PO TABS
25.0000 mg | ORAL_TABLET | Freq: Every day | ORAL | 3 refills | Status: DC
Start: 2017-07-14 — End: 2018-08-17

## 2017-07-14 NOTE — Assessment & Plan Note (Signed)
Significantly improved BP control now on current regimen off CCB due to swelling and back on thiazide Previously resistant HTN Home reading improved Prior meds: Failed CCB (Adalat, Amlodipine, swelling) - History of hyponatremia on thiazide - Clonidine 0.2 stopped use was only PRN for alcohol withdrawal from hospital  Plan - Check BMET today for electrolytes now 4 weeks on HCTZ - For now continue HCTZ 25mg  daily and Lisinopril 40mg  daily - refill 90 day sent - Remain off CCB - Follow-up 3 months annual phys, labs, BP log

## 2017-07-14 NOTE — Patient Instructions (Addendum)
Thank you for coming to the office today.  Keep up current plan with BP medications, refilled Thiazide and Lisinopril now for 90 days, with plenty of refills  Glad BP is controlled and swelling improved.  For hiccups and belching - referral to [facility]) [address] [place], [place] [address] Phone: [phone]  We could consider a short course of Chlorpromazine (this is an old anti psychotic medication - short term only) let get their opinoin first  We checked blood today for chemistry for Sodium, will call with results  DUE for FASTING BLOOD WORK (no food or drink after midnight before the lab appointment, only water or coffee without cream/sugar on the morning of)  SCHEDULE "Lab Only" visit in the morning at the clinic for lab draw in 3 MONTHS   - Make sure Lab Only appointment is at about 1 week before your next appointment, so that results will be available  For Lab Results, once available within 2-3 days of blood draw, you can can log in to MyChart online to view your results and a brief explanation. Also, we can discuss results at next follow-up visit.   Please schedule a Follow-up Appointment to: Return in about 3 months (around 10/14/2017) for Annual Physical.  If you have any other questions or concerns, please feel free to call the office or send a message through [place]. You may also schedule an earlier appointment if necessary.  Additionally, you may be receiving a survey about your experience at our office within a few days to 1 week by e-mail or mail. We value your feedback.  [name], DO [place]

## 2017-07-14 NOTE — Progress Notes (Signed)
Subjective:    Patient ID: John Booker, male    DOB: 1950-10-24, 67 y.o.   MRN: [id number]  John Booker is a 67 y.o. male presenting on 07/14/2017 for Edema (improved) and Hypertension   HPI   FOLLOW-UP CHRONIC HTN/ Bilateral Lower Extremity Edema - Last visit with me 06/16/17, for same problem with HTN and Leg Swelling, he has had side effect LE edema from Amlodipine and was better controlled on thiazide but did have history of hyponatremia while drinking alcohol, also his lisinopril was increased from 20 to 40mg  with better BP control, see prior notes for background information. - Interval update with his leg swelling has now resolved - Also he came in for blood test today BMET for chemistry now on med changes including HCTZ - pending result - Today he is doing well, feels BP is controlled on recent measurements on current meds, and leg foot swelling is resolved - Current Meds - HCTZ 25mg  daily (has inc to full 1 tab daily), Lisinopril 40mg  daily - due for refills Lifestyle: - Diet:unchanged, remains alcohol free, he drinks some water, balanced diet unsure on salt content of foods - Exercise:limited - No longer needing to elevated legs - He has not had testing for other causes of swelling before, never seen Cardiologist, has not had ECHO, has not seen vascular or has not had Liver testing or imaging, he has had some elevated LFTs before Denies CP, dyspnea, HA, dizziness / lightheadedness, leg pain or redness, numbness tingling weakness  CHRONIC HICCUPS / BELCHING GAS - Prior history of persistent intractable hiccups, now for >8 months with recurrent flares and episodes and worsening. Seems to not resolve with any treatments or home remedy. He used to have long history of heavy alcohol consumption was alcohol free for 12 years then resumed drinking back in 2017 and 2018 then has been sober now since 03/2017 again. - He has episodes for days with hiccups sometimes only hours, seems to  be sporadic no known trigger, sometimes he can go several days without episodes - Tried Baclofen PRN - very mild relief but not significant, he was cautious about sleepiness, didn't take regularly - Pharmacist recommended he ask his doctor about Chlorpromazine, but he does not want to take an anti-psychotic today - He requests 2nd opinion from gastroenterologist - Denies abdominal pain, nausea vomiting, fever chills, chest pain, dyspnea  Health Maintenance: Due for routine Hepatitis C screening lab Also due for colonoscopy  Depression screen [facility] 2/9 07/14/2017 06/16/2017 05/08/2017  Decreased Interest 0 0 0  Down, Depressed, Hopeless 0 0 0  PHQ - 2 Score 0 0 0  Altered sleeping 0 0 0  Tired, decreased energy 0 0 0  Change in appetite 0 0 0  Feeling bad or failure about yourself  0 0 0  Trouble concentrating 0 0 0  Moving slowly or fidgety/restless 0 0 0  Suicidal thoughts 0 0 0  PHQ-9 Score 0 0 0  Difficult doing work/chores Not difficult at all Not difficult at all Not difficult at all    Social History   Tobacco Use  . Smoking status: Former Smoker    Packs/day: 0.50    Years: 38.00    Pack years: 19.00    Types: Cigarettes  . Smokeless tobacco: Never Used  Substance Use Topics  . Alcohol use: No  . Drug use: No    Review of Systems Per HPI unless specifically indicated above     Objective:  BP 118/63   Pulse 74   Temp 98.6 F (37 C) (Oral)   Resp 16   Ht 6\' 3"  (1.905 m)   Wt 222 lb (100.7 kg)   BMI 27.75 kg/m   Wt Readings from Last 3 Encounters:  07/14/17 222 lb (100.7 kg)  06/16/17 228 lb (103.4 kg)  05/08/17 219 lb (99.3 kg)    Physical Exam  Constitutional: He is oriented to person, place, and time. He appears well-developed and well-nourished. No distress.  Well-appearing, comfortable, cooperative  HENT:  Head: Normocephalic and atraumatic.  Mouth/Throat: Oropharynx is clear and moist.  Eyes: Conjunctivae are normal. Right eye exhibits no  discharge. Left eye exhibits no discharge.  Neck: Normal range of motion. Neck supple.  Cardiovascular: Normal rate, regular rhythm, normal heart sounds and intact distal pulses.  No murmur heard. Pulmonary/Chest: Effort normal. No respiratory distress.  Abdominal: Soft. Bowel sounds are normal. He exhibits no distension. There is no tenderness.  Frequent belching and hiccups  Musculoskeletal: Normal range of motion. He exhibits edema (Significant improvement now has only trace to +1 non pitting edema bilateral feet and ankles, non tender or erythema).  Neurological: He is alert and oriented to person, place, and time.  Skin: Skin is warm and dry. No rash noted. He is not diaphoretic. No erythema.  Psychiatric: He has a normal mood and affect. His behavior is normal.  Well groomed, good eye contact, normal speech and thoughts  Nursing note and vitals reviewed.  Results for orders placed or performed in visit on 03/17/17  COMPLETE METABOLIC PANEL WITH GFR  Result Value Ref Range   Glucose, Bld 108 65 - 139 mg/dL   BUN 15 7 - 25 mg/dL   Creat 1.16 0.70 - 1.25 mg/dL   GFR, Est Non African American 64 > OR = 60 mL/min/1.61m2   GFR, Est African American 75 > OR = 60 mL/min/1.59m2   BUN/Creatinine Ratio NOT APPLICABLE 6 - 22 (calc)   Sodium 137 135 - 146 mmol/L   Potassium 4.8 3.5 - 5.3 mmol/L   Chloride 101 98 - 110 mmol/L   CO2 29 20 - 32 mmol/L   Calcium 10.1 8.6 - 10.3 mg/dL   Total Protein 6.7 6.1 - 8.1 g/dL   Albumin 4.2 3.6 - 5.1 g/dL   Globulin 2.5 1.9 - 3.7 g/dL (calc)   AG Ratio 1.7 1.0 - 2.5 (calc)   Total Bilirubin 0.5 0.2 - 1.2 mg/dL   Alkaline phosphatase (APISO) 80 40 - 115 U/L   AST 54 (H) 10 - 35 U/L   ALT 92 (H) 9 - 46 U/L  Magnesium  Result Value Ref Range   Magnesium 1.8 1.5 - 2.5 mg/dL      Assessment & Plan:   Problem List Items Addressed This Visit    Bilateral lower extremity edema    Significantly improved nearly resolved bilateral LE edema Seems  likely secondary to CCB amlodipine - History of varicose veins and spider veins, former smoker, suspect likely related to vascular history may have venous insufficiency. - Now less likely to need other options such as ECHO in future and possibly Abdominal RUQ [place] for liver eval given history of alcohol use and elevated LFTs in past  Plan Check BMET today, review electrolytes Cr back on thiazide Remain off Amlodipine Refilled HCTZ 25mg  daily, Lisinopril 40mg  daily - May continue PRN RICE reviewed compression elevation if need - Future follow-up if recurrence will consider ECHO, RUQ [place] or Vascular  Relevant Medications   hydrochlorothiazide (HYDRODIURIL) 25 MG tablet   HTN (hypertension) - Primary    Significantly improved BP control now on current regimen off CCB due to swelling and back on thiazide Previously resistant HTN Home reading improved Prior meds: Failed CCB (Adalat, Amlodipine, swelling) - History of hyponatremia on thiazide - Clonidine 0.2 stopped use was only PRN for alcohol withdrawal from hospital  Plan - Check BMET today for electrolytes now 4 weeks on HCTZ - For now continue HCTZ 25mg  daily and Lisinopril 40mg  daily - refill 90 day sent - Remain off CCB - Follow-up 3 months annual phys, labs, BP log      Relevant Medications   hydrochlorothiazide (HYDRODIURIL) 25 MG tablet   lisinopril (PRINIVIL,ZESTRIL) 40 MG tablet    Other Visit Diagnoses    Hiccups       Relevant Orders   Ambulatory referral to Gastroenterology   Belchings       Relevant Orders   Ambulatory referral to Gastroenterology    Uncertain etiology of refractory chronic now >8 months hiccups/belching in gas production, seems worse episodes intermittent over days, temporary relief. No other significant GI red flag symptoms or associated factors. He has history of alcohol abuse but has been sober now for years, did have recurrence but again sober since 03/2017 - Trial on Baclofen limited  improvement, he was concern with sedation - He is asking about chlorpromazine today, reviewed short term use for hiccups but potential risks  Plan Agree for referral to GI for 2nd opinion and may rule out other etiology as well for upper GI symptoms of inc gas belching Follow-up in future as needed    Meds ordered this encounter  Medications  . hydrochlorothiazide (HYDRODIURIL) 25 MG tablet    Sig: Take 1 tablet (25 mg total) by mouth daily.    Dispense:  90 tablet    Refill:  3  . lisinopril (PRINIVIL,ZESTRIL) 40 MG tablet    Sig: Take 1 tablet (40 mg total) by mouth daily.    Dispense:  90 tablet    Refill:  3   Orders Placed This Encounter  Procedures  . Ambulatory referral to Gastroenterology    Referral Priority:   Routine    Referral Type:   Consultation    Referral Reason:   Specialty Services Required    Number of Visits Requested:   1    Follow up plan: Return in about 3 months (around 10/14/2017) for Annual Physical.   Future labs ordered for 10/16/17 - including added Hepatitis C screening, routine.  [name], DO [facility] 07/14/2017, 10:49 PM

## 2017-07-14 NOTE — Assessment & Plan Note (Addendum)
Significantly improved nearly resolved bilateral LE edema Seems likely secondary to CCB amlodipine - History of varicose veins and spider veins, former smoker, suspect likely related to vascular history may have venous insufficiency. - Now less likely to need other options such as ECHO in future and possibly Abdominal RUQ [place] for liver eval given history of alcohol use and elevated LFTs in past  Plan Check BMET today, review electrolytes Cr back on thiazide Remain off Amlodipine Refilled HCTZ 25mg  daily, Lisinopril 40mg  daily - May continue PRN RICE reviewed compression elevation if need - Future follow-up if recurrence will consider ECHO, RUQ [place] or Vascular

## 2017-07-15 ENCOUNTER — Encounter: Payer: Self-pay | Admitting: Family Medicine

## 2017-07-15 DIAGNOSIS — R7989 Other specified abnormal findings of blood chemistry: Secondary | ICD-10-CM | POA: Insufficient documentation

## 2017-07-16 ENCOUNTER — Ambulatory Visit: Payer: Medicare Other | Admitting: Gastroenterology

## 2017-07-16 ENCOUNTER — Encounter: Payer: Self-pay | Admitting: Gastroenterology

## 2017-07-16 VITALS — BP 127/71 | HR 91 | Temp 98.4°F | Ht 75.0 in | Wt 221.0 lb

## 2017-07-16 DIAGNOSIS — R066 Hiccough: Secondary | ICD-10-CM | POA: Diagnosis not present

## 2017-07-16 MED ORDER — OMEPRAZOLE 20 MG PO CPDR: 20.0000 mg | DELAYED_RELEASE_CAPSULE | Freq: Every day | ORAL | 0 refills | Status: DC

## 2017-07-16 NOTE — Patient Instructions (Signed)
F/U 1 MONTH CALL us IN 2 WKS AND LET us KNOW IF SYMPTOMS ANY BETTER  Hiccups A hiccup is the result of a sudden shortening of the muscle below your lungs (diaphragm). This movement of your diaphragm causes a sudden inhalation followed by the closing of your vocal cords, which causes the hiccup sound. Most people get the hiccups. Typically, hiccups last only a short amount of time. There are three types of hiccups:  Benign. These hiccups last less than 48 hours.  Persistent. These hiccups last more than 48 hours, but less than 1 month.  Intractable. These hiccups last more than 1 month.  A hiccup is a reflex. You cannot control reflexes. Follow these instructions at home: Watch your hiccups for any changes. The following actions may help to lessen any discomfort that you are feeling:  Eat small meals.  Limit alcohol intake to no more than 1 drink per day for nonpregnant women and 2 drinks per day for men. One drink equals 12 oz of beer, 5 oz of wine, or 1 oz of hard liquor.  Limit drinking carbonated or fizzy drinks, such as soda.  Eat and chew your food slowly.  Avoid eating or drinking hot or spicy foods and drinks.  Take medicines only as directed by your health care provider.  Contact a health care provider if:  Your hiccups last for more than 48 hours.  Your hiccups do not improve with treatment.  You cannot sleep or eat due to the hiccups.  You have unexpected weight loss due to the hiccups.  You have a fever.  You have trouble breathing or swallowing.  You develop severe pain in your abdomen.  You develop numbness, tingling, or weakness. This information is not intended to replace advice given to you by your health care provider. Make sure you discuss any questions you have with your health care provider. Document Released: 05/05/2001 Document Revised: 08/02/2015 Document Reviewed: 02/20/2014 Elsevier Interactive Patient Education  [employer].

## 2017-07-17 NOTE — Progress Notes (Signed)
John Booker [address]  [place]  [place], [place] [address]  Main: [phone]  Fax: [phone]   Gastroenterology Consultation  Referring Provider:     [name] * Primary Care Physician:  Olin Hauser, DO Primary Gastroenterologist:  Dr. Vonda Booker Reason for Consultation:     Hiccups        HPI:    Chief Complaint  Patient presents with  . Establish Care    referral from Dr. Parks Ranger for hiccups ( has since just after christmas), belching    John Booker is a 67 y.o. y/o male referred for consultation & management  by Dr. Parks Ranger, Devonne Doughty, DO.  Patient reports a 59-month history of daily.  Comes occur constantly without relief.  Last week he had one day that he did not have the hiccups and was able to rest all day and sleep well.  Otherwise, the hiccups are affecting his daily life, and he is unable to sleep well because of them either.  Primary care provider prescribed baclofen which did not help and patient stopped taking it.  He has vomited 1-2 times because they comes in the past.  No hematemesis.  No weight loss.  No altered bowel habits or blood in stool.  No family history of colon cancer.  No previous history of upper endoscopy.  Maneuvers such as holding breath, sipping water, did not help.  Past Medical History:  Diagnosis Date  . Hypertension     Past Surgical History:  Procedure Laterality Date  . INCISION AND DRAINAGE PERIRECTAL ABSCESS N/A 11/19/2015   Procedure: IRRIGATION AND DEBRIDEMENT PERIRECTAL ABSCESS and debridement of scrotal abscess;  Surgeon: Florene Glen, MD;  Location: [facility];  Service: General;  Laterality: N/A;  . INCISION AND DRAINAGE PERIRECTAL ABSCESS N/A 11/20/2015   Procedure: IRRIGATION AND DEBRIDEMENT PERIRECTAL ABSCESS / WITH DRESSING CHANGE;  Surgeon: Florene Glen, MD;  Location: [facility];  Service: General;  Laterality: N/A;  peri rectal site  . INCISION AND DRAINAGE  PERIRECTAL ABSCESS N/A 11/21/2015   Procedure: IRRIGATION AND DEBRIDEMENT PERIRECTAL ABSCESS WITH DRESSING CHANGE;  Surgeon: Florene Glen, MD;  Location: [facility] OR[facility];  Service: General;  Laterality: N/A;  . [place]    Prior to Admission medications   Medication Sig Start Date End Date Taking? Authorizing Provider  feeding supplement, ENSURE ENLIVE, (ENSURE ENLIVE) LIQD Take 237 mLs by mouth 2 (two) times daily between meals. 03/09/17  Yes Epifanio Lesches, MD  hydrochlorothiazide (HYDRODIURIL) 25 MG tablet Take 1 tablet (25 mg total) by mouth daily. 07/14/17  Yes Karamalegos, Devonne Doughty, DO  lisinopril (PRINIVIL,ZESTRIL) 40 MG tablet Take 1 tablet (40 mg total) by mouth daily. 07/14/17  Yes Karamalegos, Devonne Doughty, DO  baclofen (LIORESAL) 10 MG tablet Take 0.5-1 tablets (5-10 mg total) by mouth 3 (three) times daily as needed for muscle spasms. Patient not taking: Reported on 07/16/2017 03/17/17   Olin Hauser, DO  omeprazole (PRILOSEC) 20 MG capsule Take 1 capsule (20 mg total) by mouth daily. 07/16/17   Virgel Manifold, MD    Family History  Problem Relation Age of Onset  . Hypertension Father      Social History   Tobacco Use  . Smoking status: Former Smoker    Packs/day: 0.50    Years: 38.00    Pack years: 19.00    Types: Cigarettes  . Smokeless tobacco: Never Used  Substance Use Topics  . Alcohol use: No  .  Drug use: No    Allergies as of 07/16/2017 - Review Complete 07/16/2017  Allergen Reaction Noted  . Adalat [nifedipine]  12/05/2015  . Amlodipine Other (See Comments) 07/14/2017    Review of Systems:    All systems reviewed and negative except where noted in HPI.   Physical Exam:  BP 127/71   Pulse 91   Temp 98.4 F (36.9 C) (Oral)   Ht 6\' 3"  (1.905 m)   Wt 221 lb (100.2 kg)   BMI 27.62 kg/m  No LMP for male patient. Psych:  Alert and cooperative. Normal mood and affect. General:   Alert,  Well-developed, well-nourished,  pleasant and cooperative in NAD Head:  Normocephalic and atraumatic. Eyes:  Sclera clear, no icterus.   Conjunctiva pink. Ears:  Normal auditory acuity. Nose:  No deformity, discharge, or lesions. Mouth:  No deformity or lesions,oropharynx pink & moist. Neck:  Supple; no masses or thyromegaly. Lungs:  Respirations even and unlabored.  Clear throughout to auscultation.   No wheezes, crackles, or rhonchi. No acute distress. Heart:  Regular rate and rhythm; no murmurs, clicks, rubs, or gallops. Abdomen:  Normal bowel sounds.  No bruits.  Soft, non-tender and non-distended without masses, hepatosplenomegaly or hernias noted.  No guarding or rebound tenderness.    Msk:  Symmetrical without gross deformities. Good, equal movement & strength bilaterally. Pulses:  Normal pulses noted. Extremities:  No clubbing or edema.  No cyanosis. Neurologic:  Alert and oriented x3;  grossly normal neurologically. Skin:  Intact without significant lesions or rashes. No jaundice. Lymph Nodes:  No significant cervical adenopathy. Psych:  Alert and cooperative. Normal mood and affect.   Labs: CBC    Component Value Date/Time   WBC 5.8 03/04/2017 1946   RBC 4.31 (L) 03/04/2017 1946   HGB 14.8 03/04/2017 1946   HGB 15.7 04/15/2012 2049   HCT 43.1 03/04/2017 1946   HCT 47.7 04/15/2012 2049   PLT 136 (L) 03/04/2017 1946   PLT 175 04/15/2012 2049   MCV 100.0 03/04/2017 1946   MCV 92 04/15/2012 2049   MCH 34.4 (H) 03/04/2017 1946   MCHC 34.4 03/04/2017 1946   RDW 14.1 03/04/2017 1946   RDW 13.9 04/15/2012 2049   LYMPHSABS 1.3 03/03/2017 2018   MONOABS 0.2 03/03/2017 2018   EOSABS 0.0 03/03/2017 2018   BASOSABS 0.1 03/03/2017 2018   CMP     Component Value Date/Time   NA 136 07/14/2017 0905   NA 135 (L) 04/15/2012 2049   K 5.1 07/14/2017 0905   K 3.5 04/15/2012 2049   CL 102 07/14/2017 0905   CL 98 04/15/2012 2049   CO2 28 07/14/2017 0905   CO2 30 04/15/2012 2049   GLUCOSE 94 07/14/2017 0905    GLUCOSE 107 (H) 04/15/2012 2049   BUN 25 07/14/2017 0905   BUN 17 04/15/2012 2049   CREATININE 1.40 (H) 07/14/2017 0905   CALCIUM 10.2 07/14/2017 0905   CALCIUM 8.5 04/15/2012 2049   PROT 6.7 03/17/2017 0915   PROT 7.3 04/15/2012 2049   ALBUMIN 4.3 03/04/2017 1946   ALBUMIN 3.4 04/15/2012 2049   AST 54 (H) 03/17/2017 0915   AST 72 (H) 04/15/2012 2049   ALT 92 (H) 03/17/2017 0915   ALT 63 04/15/2012 2049   ALKPHOS 120 03/04/2017 1946   ALKPHOS 88 04/15/2012 2049   BILITOT 0.5 03/17/2017 0915   BILITOT 0.8 04/15/2012 2049   GFRNONAA 51 (L) 07/14/2017 0905   GFRAA 59 (L) 07/14/2017 0905    Imaging  Studies: No results found.  Assessment and Plan:   MANOLITO JUREWICZ is a 67 y.o. y/o male has been referred for 87-month history of constant hiccups daily  Daily history of hiccups, without relief, as expected, as definitely affecting her quality of life of this very nice and pleasant patient. Usually, those are expected to abate themselves after 48 hours, and also respond to maneuver such as holding her breath, and drinking liquids etc.  However, patient symptoms do not respond to these maneuvers.  Therefore, further evaluation for rare causes of hiccups is indicated I have offered upper endoscopy, and discussed the risks and benefits in detail with the patient.  This would allow [place] to evaluate for any large hiatal hernia, esophageal or gastric masses that could be contributing to his symptoms.  Patient would like to start with conservative management, and if that does not help, then proceed with upper endoscopy.  As GERD/gastric distention can sometimes lead to to hiccups, we will start PPI twice daily to see if this helps.  Patient was asked to call us in 2 weeks to let us know how his symptoms are doing, and if no improvement, to proceed with upper endoscopy.  He agrees with this plan  (Risks of PPI use were discussed with patient including bone loss, C. Diff diarrhea, pneumonia,  infections, CKD, electrolyte abnormalities.  If clinically possible based on symptoms, goal would be to maintain patient on the lowest dose possible, or discontinue the medication with institution of acid reflux lifestyle modifications over time. Pt. Verbalizes understanding and chooses to continue the medication.)  If GI causes are ruled out, primary care provider, to consider pan CT to rule out any underlying lesions, such as enlarged lymph nodes near the phrenic nerve, or other malignancies as cause of his symptoms.  Gabapentin for symptomatic relief can also be considered by the primary care provider if PPI does not help.  Dr John Booker

## 2017-08-05 ENCOUNTER — Other Ambulatory Visit: Payer: Self-pay

## 2017-08-10 ENCOUNTER — Encounter: Payer: Self-pay | Admitting: Family Medicine

## 2017-08-19 ENCOUNTER — Encounter: Payer: Self-pay | Admitting: Gastroenterology

## 2017-08-19 ENCOUNTER — Ambulatory Visit: Payer: Medicare Other | Admitting: Gastroenterology

## 2017-10-16 ENCOUNTER — Encounter: Payer: Self-pay | Admitting: Family Medicine

## 2017-10-16 ENCOUNTER — Ambulatory Visit (INDEPENDENT_AMBULATORY_CARE_PROVIDER_SITE_OTHER): Payer: Medicare Other | Admitting: Family Medicine

## 2017-10-16 ENCOUNTER — Other Ambulatory Visit: Payer: Medicare Other

## 2017-10-16 VITALS — BP 111/53 | HR 89 | Temp 98.4°F | Resp 16 | Ht 75.0 in | Wt 218.0 lb

## 2017-10-16 DIAGNOSIS — R600 Localized edema: Secondary | ICD-10-CM | POA: Diagnosis not present

## 2017-10-16 DIAGNOSIS — R066 Hiccough: Secondary | ICD-10-CM | POA: Insufficient documentation

## 2017-10-16 DIAGNOSIS — I10 Essential (primary) hypertension: Secondary | ICD-10-CM

## 2017-10-16 DIAGNOSIS — Z0000 Encounter for general adult medical examination without abnormal findings: Secondary | ICD-10-CM

## 2017-10-16 DIAGNOSIS — R7989 Other specified abnormal findings of blood chemistry: Secondary | ICD-10-CM | POA: Diagnosis not present

## 2017-10-16 DIAGNOSIS — R7309 Other abnormal glucose: Secondary | ICD-10-CM

## 2017-10-16 DIAGNOSIS — F1021 Alcohol dependence, in remission: Secondary | ICD-10-CM

## 2017-10-16 DIAGNOSIS — Z1159 Encounter for screening for other viral diseases: Secondary | ICD-10-CM | POA: Diagnosis not present

## 2017-10-16 DIAGNOSIS — R351 Nocturia: Secondary | ICD-10-CM

## 2017-10-16 MED ORDER — GABAPENTIN 100 MG PO CAPS: ORAL_CAPSULE | ORAL | 1 refills | Status: DC

## 2017-10-16 NOTE — Progress Notes (Signed)
Subjective:    Patient ID: John Booker, male    DOB: Aug 03, 1950, 67 y.o.   MRN: [id number]  John Booker is a 67 y.o. male presenting on 10/16/2017 for Annual Exam   HPI   Here for Annual Physical and had fasting lab draw this AM.  FOLLOW-UPCHRONIC HTN/ Bilateral Lower Extremity Edema Last visit 07/2017. No new concerns today. He is feeling well and thinks BP is controlled. Recent course has done well OFF Amlodipine that was causing him LE edema. - Current Meds - HCTZ 25mg  daily, Lisinopril40mg  daily Lifestyle: - Diet: stable, remains alcohol free, he drinks some water, balanced diet unsure on salt content of foods - Exercise:limited  Elevated Creatinine Prior trend of mild to moderate elevated Cr. Consistent with CKD Due for labs today  CHRONIC HICCUPS / BELCHING GAS Established with [facility] Dr Bonna Gains on 07/16/17 - tried Omeprazole 20mg  rx and titrated up to BID, he tried OTC for period of time and it was ineffective, now has stopped this med. She also recommended Gabapentin that we could consider, he was not given rx of this med. He is open to suggestions today. He was unable to afford the future EGD procedure, since he is still paying off hospital bill from before, he will return to her to schedule this in future. - Admits still hiccups daily basis and some belching gas - Failed baclofen, without relief  Health Maintenance: Routine Hep C screening is pending.  Due for Flu Shot, declines today despite counseling on benefits  Colon CA Screening: Last Colonoscopy >10 years ago done by outside GI, reported to have been done without sedation and he did not tolerate it well, he declines repeat colonoscopy at this time, also declines cologuard or other fecal testing non invasive, he would consider colonoscopy with sedation and is already patient of [facility], will reconsider with them when future return for EGD. No known fam history of colon CA  Depression screen [facility] 2/9 10/16/2017  07/14/2017 06/16/2017  Decreased Interest 0 0 0  Down, Depressed, Hopeless 0 0 0  PHQ - 2 Score 0 0 0  Altered sleeping - 0 0  Tired, decreased energy - 0 0  Change in appetite - 0 0  Feeling bad or failure about yourself  - 0 0  Trouble concentrating - 0 0  Moving slowly or fidgety/restless - 0 0  Suicidal thoughts - 0 0  PHQ-9 Score - 0 0  Difficult doing work/chores - Not difficult at all Not difficult at all    History reviewed. No pertinent past medical history. Past Surgical History:  Procedure Laterality Date  . INCISION AND DRAINAGE PERIRECTAL ABSCESS N/A 11/19/2015   Procedure: IRRIGATION AND DEBRIDEMENT PERIRECTAL ABSCESS and debridement of scrotal abscess;  Surgeon: Florene Glen, MD;  Location: [facility];  Service: General;  Laterality: N/A;  . INCISION AND DRAINAGE PERIRECTAL ABSCESS N/A 11/20/2015   Procedure: IRRIGATION AND DEBRIDEMENT PERIRECTAL ABSCESS / WITH DRESSING CHANGE;  Surgeon: Florene Glen, MD;  Location: [facility];  Service: General;  Laterality: N/A;  peri rectal site  . INCISION AND DRAINAGE PERIRECTAL ABSCESS N/A 11/21/2015   Procedure: IRRIGATION AND DEBRIDEMENT PERIRECTAL ABSCESS WITH DRESSING CHANGE;  Surgeon: Florene Glen, MD;  Location: [facility];  Service: General;  Laterality: N/A;  . [place]   Social History   Socioeconomic History  . Marital status: Legally Separated    Spouse name: Not on file  . Number of children: Not on  file  . Years of education: Not on file  . Highest education level: Not on file  Occupational History  . Not on file  Social Needs  . Financial resource strain: Not on file  . Food insecurity:    Worry: Not on file    Inability: Not on file  . Transportation needs:    Medical: Not on file    Non-medical: Not on file  Tobacco Use  . Smoking status: Former Smoker    Packs/day: 0.50    Years: 38.00    Pack years: 19.00    Types: Cigarettes  . Smokeless tobacco: Never Used  Substance and  Sexual Activity  . Alcohol use: No  . Drug use: No  . Sexual activity: Not on file  Lifestyle  . Physical activity:    Days per week: Not on file    Minutes per session: Not on file  . Stress: Not on file  Relationships  . Social connections:    Talks on phone: Not on file    Gets together: Not on file    Attends religious service: Not on file    Active member of club or organization: Not on file    Attends meetings of clubs or organizations: Not on file    Relationship status: Not on file  . Intimate partner violence:    Fear of current or ex partner: Not on file    Emotionally abused: Not on file    Physically abused: Not on file    Forced sexual activity: Not on file  Other Topics Concern  . Not on file  Social History Narrative  . Not on file   Family History  Problem Relation Age of Onset  . Hypertension Father    Current Outpatient Medications on File Prior to Visit  Medication Sig  . hydrochlorothiazide (HYDRODIURIL) 25 MG tablet Take 1 tablet (25 mg total) by mouth daily.  Marland Kitchen lisinopril (PRINIVIL,ZESTRIL) 40 MG tablet Take 1 tablet (40 mg total) by mouth daily.  . feeding supplement, ENSURE ENLIVE, (ENSURE ENLIVE) LIQD Take 237 mLs by mouth 2 (two) times daily between meals. (Patient not taking: Reported on 10/16/2017)   No current facility-administered medications on file prior to visit.     Review of Systems  Constitutional: Negative for activity change, appetite change, chills, diaphoresis, fatigue and fever.  HENT: Negative for congestion and hearing loss.   Eyes: Negative for visual disturbance.  Respiratory: Negative for apnea, cough, choking, chest tightness, shortness of breath and wheezing.   Cardiovascular: Negative for chest pain, palpitations and leg swelling.  Gastrointestinal: Negative for abdominal pain, anal bleeding, blood in stool, constipation, diarrhea, nausea and vomiting.       Hiccups, gas belching  Endocrine: Negative for cold intolerance.    Genitourinary: Negative for decreased urine volume, difficulty urinating, dysuria, frequency, hematuria, testicular pain and urgency.  Musculoskeletal: Negative for arthralgias, back pain and neck pain.  Skin: Negative for rash.  Allergic/Immunologic: Negative for environmental allergies.  Neurological: Negative for dizziness, weakness, light-headedness, numbness and headaches.  Hematological: Negative for adenopathy.  Psychiatric/Behavioral: Negative for behavioral problems, dysphoric mood and sleep disturbance. The patient is not nervous/anxious.    Per HPI unless specifically indicated above     Objective:    BP (!) 111/53   Pulse 89   Temp 98.4 F (36.9 C) (Oral)   Resp 16   Ht 6\' 3"  (1.905 m)   Wt 218 lb (98.9 kg)   BMI 27.25 kg/m  Wt Readings from Last 3 Encounters:  10/16/17 218 lb (98.9 kg)  07/16/17 221 lb (100.2 kg)  07/14/17 222 lb (100.7 kg)    Physical Exam  Constitutional: He is oriented to person, place, and time. He appears well-developed and well-nourished. No distress.  Well-appearing, comfortable, cooperative  HENT:  Head: Normocephalic and atraumatic.  Mouth/Throat: Oropharynx is clear and moist.  Eyes: Pupils are equal, round, and reactive to light. Conjunctivae and EOM are normal. Right eye exhibits no discharge. Left eye exhibits no discharge.  Neck: Normal range of motion. Neck supple. No thyromegaly present.  Cardiovascular: Normal rate, regular rhythm, normal heart sounds and intact distal pulses.  No murmur heard. Pulmonary/Chest: Effort normal and breath sounds normal. No respiratory distress. He has no wheezes. He has no rales.  Abdominal: Soft. Bowel sounds are normal. He exhibits no distension and no mass. There is no tenderness.  Active hiccups during exam  Musculoskeletal: Normal range of motion. He exhibits no edema (Remains resolved, mild varicose vs spider veins present) or tenderness.  Upper / Lower Extremities: - Normal muscle tone,  strength bilateral upper extremities 5/5, lower extremities 5/5  Lymphadenopathy:    He has no cervical adenopathy.  Neurological: He is alert and oriented to person, place, and time.  Distal sensation intact to light touch all extremities  Skin: Skin is warm and dry. No rash noted. He is not diaphoretic. No erythema.  Psychiatric: He has a normal mood and affect. His behavior is normal.  Well groomed, good eye contact, normal speech and thoughts  Nursing note and vitals reviewed.  Results for orders placed or performed in visit on 40/98/11  BASIC METABOLIC PANEL WITH GFR  Result Value Ref Range   Glucose, Bld 94 65 - 99 mg/dL   BUN 25 7 - 25 mg/dL   Creat 1.40 (H) 0.70 - 1.25 mg/dL   GFR, Est Non African American 51 (L) > OR = 60 mL/min/1.61m2   GFR, Est African American 59 (L) > OR = 60 mL/min/1.56m2   BUN/Creatinine Ratio 18 6 - 22 (calc)   Sodium 136 135 - 146 mmol/L   Potassium 5.1 3.5 - 5.3 mmol/L   Chloride 102 98 - 110 mmol/L   CO2 28 20 - 32 mmol/L   Calcium 10.2 8.6 - 10.3 mg/dL      Assessment & Plan:   Problem List Items Addressed This Visit    Bilateral lower extremity edema    Remains significantly improved, mostly resolved OFF Amlodipine Has some residual spider veins / varicose Follow-up PRN      Elevated serum creatinine    Elevated Cr previously concern CKD-II to III Will check labs today, follow up result Remains on ACEi/Thiazide      Essential hypertension    Remains well controlled BP Previously resistant HTN Home reading improved Prior meds: Failed CCB (Adalat, Amlodipine, swelling), Clonidine (alcohol wd only) - History of hyponatremia on thiazide Complicated by CKD-II to III - pending lab today  Plan 1. Continue current BP regimen - Lisinopril 40mg , HCTZ 25mg  daily 2. Encourage improved lifestyle - low sodium diet, improve regular exercise 3. Continue monitor BP outside office, bring readings to next visit, if persistently >140/90 or new  symptoms notify office sooner 4. Follow-up 6 months      Hiccups    Persistent chronic problem daily hiccups/belching gas Followed by [facility] recently, uncertain etiology, trial of PPI failed Pending EGD in future - limited due to financial of patient, will schedule  Today -agree with trial of Gabapentin low dose 100mg  nightly titrate up as tolerated instructions per AVS, this was recommendation of [facility] Dr Bonna Gains, patient will try to see - notify if ineffective, counseling on potential side effects      Relevant Medications   gabapentin (NEURONTIN) 100 MG capsule    Other Visit Diagnoses    Annual physical exam    -  Primary    Updated Health Maintenance information - Offered colon CA screening, this was declined - reconsider w/ GI future may do EGD / Colonoscopy Reviewed recent lab results with patient Encouraged improvement to lifestyle with diet and exercise    Meds ordered this encounter  Medications  . gabapentin (NEURONTIN) 100 MG capsule    Sig: For hiccups - Start 1 capsule at bedtime. Increase by 1 cap every 5-7 days as tolerated up to 3 times a day, or take 3 at once in evening.    Dispense:  90 capsule    Refill:  1    Follow up plan: Return in about 6 months (around 04/18/2018) for 6 month follow-up HTN, edema, GI hiccups.  [name], [facility] 10/16/2017, 12:57 PM

## 2017-10-16 NOTE — Assessment & Plan Note (Signed)
Remains well controlled BP Previously resistant HTN Home reading improved Prior meds: Failed CCB (Adalat, Amlodipine, swelling), Clonidine (alcohol wd only) - History of hyponatremia on thiazide Complicated by CKD-II to III - pending lab today  Plan 1. Continue current BP regimen - Lisinopril 40mg , HCTZ 25mg  daily 2. Encourage improved lifestyle - low sodium diet, improve regular exercise 3. Continue monitor BP outside office, bring readings to next visit, if persistently >140/90 or new symptoms notify office sooner 4. Follow-up 6 months

## 2017-10-16 NOTE — Patient Instructions (Addendum)
Thank you for coming to the office today.  For hiccups - Dr Bonna Gains recommended one other medicine to try in the meantime  Start Gabapentin 100mg  capsules, take at night for 2-3 nights only, and then increase to 2 times a day for a few days, and then may increase to 3 times a day, it may make you drowsy, if helps significantly at night only, then you can increase instead to 3 capsules at night, instead of 3 times a day - In the future if needed, we can significantly increase the dose if tolerated well, some common doses are 300mg  three times a day up to 600mg  three times a day, usually it takes several weeks or months to get to higher doses  ----------  When ready return to [facility] for hiccups - to discuss Upper Endoscopy and also I recommend a routine Colonoscopy - perhaps can do both at same time.  ------ Blood pressure is well controlled keep up the good work!   Please schedule a Follow-up Appointment to: Return in about 6 months (around 04/18/2018) for 6 month follow-up HTN, edema, GI hiccups.  If you have any other questions or concerns, please feel free to call the office or send a message through [place]. You may also schedule an earlier appointment if necessary.  Additionally, you may be receiving a survey about your experience at our office within a few days to 1 week by e-mail or mail. We value your feedback.  [name], DO [place]

## 2017-10-16 NOTE — Assessment & Plan Note (Signed)
Elevated Cr previously concern CKD-II to III Will check labs today, follow up result Remains on ACEi/Thiazide

## 2017-10-16 NOTE — Assessment & Plan Note (Signed)
Persistent chronic problem daily hiccups/belching gas Followed by [facility] recently, uncertain etiology, trial of PPI failed Pending EGD in future - limited due to financial of patient, will schedule  Today -agree with trial of Gabapentin low dose 100mg  nightly titrate up as tolerated instructions per AVS, this was recommendation of [facility] Dr Bonna Gains, patient will try to see - notify if ineffective, counseling on potential side effects

## 2017-10-16 NOTE — Assessment & Plan Note (Signed)
Remains significantly improved, mostly resolved OFF Amlodipine Has some residual spider veins / varicose Follow-up PRN

## 2017-10-19 ENCOUNTER — Other Ambulatory Visit: Payer: Self-pay | Admitting: Family Medicine

## 2017-10-19 DIAGNOSIS — I10 Essential (primary) hypertension: Secondary | ICD-10-CM

## 2017-10-19 DIAGNOSIS — R7989 Other specified abnormal findings of blood chemistry: Secondary | ICD-10-CM

## 2017-10-19 LAB — CBC WITH DIFFERENTIAL/PLATELET
BASOS ABS: 39 {cells}/uL (ref 0–200)
Basophils Relative: 0.6 %
EOS ABS: 98 {cells}/uL (ref 15–500)
EOS PCT: 1.5 %
HCT: 45.7 % (ref 38.5–50.0)
HEMOGLOBIN: 16.2 g/dL (ref 13.2–17.1)
LYMPHS ABS: 1157 {cells}/uL (ref 850–3900)
MCH: 33.6 pg — AB (ref 27.0–33.0)
MCHC: 35.4 g/dL (ref 32.0–36.0)
MCV: 94.8 fL (ref 80.0–100.0)
MPV: 11.9 fL (ref 7.5–12.5)
Monocytes Relative: 9.3 %
NEUTROS ABS: 4602 {cells}/uL (ref 1500–7800)
NEUTROS PCT: 70.8 %
Platelets: 170 10*3/uL (ref 140–400)
RBC: 4.82 10*6/uL (ref 4.20–5.80)
RDW: 14.2 % (ref 11.0–15.0)
Total Lymphocyte: 17.8 %
WBC mixed population: 605 cells/uL (ref 200–950)
WBC: 6.5 10*3/uL (ref 3.8–10.8)

## 2017-10-19 LAB — COMPLETE METABOLIC PANEL WITH GFR
AG Ratio: 1.8 (calc) (ref 1.0–2.5)
ALBUMIN MSPROF: 4.6 g/dL (ref 3.6–5.1)
ALKALINE PHOSPHATASE (APISO): 77 U/L (ref 40–115)
ALT: 30 U/L (ref 9–46)
AST: 27 U/L (ref 10–35)
BILIRUBIN TOTAL: 1.4 mg/dL — AB (ref 0.2–1.2)
BUN / CREAT RATIO: 19 (calc) (ref 6–22)
BUN: 30 mg/dL — ABNORMAL HIGH (ref 7–25)
CHLORIDE: 100 mmol/L (ref 98–110)
CO2: 26 mmol/L (ref 20–32)
CREATININE: 1.6 mg/dL — AB (ref 0.70–1.25)
Calcium: 10.2 mg/dL (ref 8.6–10.3)
GFR, Est African American: 51 mL/min/{1.73_m2} — ABNORMAL LOW (ref >=60)
GFR, Est Non African American: 44 mL/min/{1.73_m2} — ABNORMAL LOW (ref >=60)
GLOBULIN: 2.6 g/dL (ref 1.9–3.7)
Glucose, Bld: 123 mg/dL — ABNORMAL HIGH (ref 65–99)
Potassium: 4.8 mmol/L (ref 3.5–5.3)
SODIUM: 136 mmol/L (ref 135–146)
Total Protein: 7.2 g/dL (ref 6.1–8.1)

## 2017-10-19 LAB — LIPID PANEL
Cholesterol: 167 mg/dL (ref <200)
HDL: 63 mg/dL (ref >40)
LDL CHOLESTEROL (CALC): 87 mg/dL
Non-HDL Cholesterol (Calc): 104 mg/dL (calc) (ref <130)
TRIGLYCERIDES: 76 mg/dL (ref <150)
Total CHOL/HDL Ratio: 2.7 (calc) (ref <5.0)

## 2017-10-19 LAB — HEPATITIS C ANTIBODY
Hepatitis C Ab: NONREACTIVE
SIGNAL TO CUT-OFF: 0.02 (ref <1.00)

## 2017-10-19 LAB — HEMOGLOBIN A1C
Hgb A1c MFr Bld: 5.4 % of total Hgb (ref <5.7)
Mean Plasma Glucose: 108 (calc)
eAG (mmol/L): 6 (calc)

## 2017-10-19 LAB — PSA, TOTAL WITH REFLEX TO PSA, FREE: PSA, TOTAL: 1.9 ng/mL (ref <=4.0)

## 2017-10-19 MED ORDER — LISINOPRIL 20 MG PO TABS: 20.0000 mg | ORAL_TABLET | Freq: Every day | ORAL | 3 refills | Status: DC

## 2017-11-17 ENCOUNTER — Other Ambulatory Visit: Payer: Medicare Other

## 2017-11-17 DIAGNOSIS — I10 Essential (primary) hypertension: Secondary | ICD-10-CM

## 2017-11-17 DIAGNOSIS — R7989 Other specified abnormal findings of blood chemistry: Secondary | ICD-10-CM

## 2017-11-17 LAB — BASIC METABOLIC PANEL WITH GFR
BUN: 18 mg/dL (ref 7–25)
CALCIUM: 10.3 mg/dL (ref 8.6–10.3)
CHLORIDE: 95 mmol/L — AB (ref 98–110)
CO2: 29 mmol/L (ref 20–32)
Creat: 1.21 mg/dL (ref 0.70–1.25)
GFR, EST NON AFRICAN AMERICAN: 61 mL/min/{1.73_m2} (ref >=60)
GFR, Est African American: 71 mL/min/{1.73_m2} (ref >=60)
Glucose, Bld: 99 mg/dL (ref 65–99)
POTASSIUM: 4.2 mmol/L (ref 3.5–5.3)
Sodium: 137 mmol/L (ref 135–146)

## 2018-04-19 ENCOUNTER — Ambulatory Visit: Payer: Self-pay | Admitting: Family Medicine

## 2018-04-26 ENCOUNTER — Telehealth: Payer: Self-pay | Admitting: Gastroenterology

## 2018-04-26 NOTE — Telephone Encounter (Signed)
Patient called & has not been seen since 07-16-17 with Dr Bonna Gains and she wanted to do an upper endoscopy and now he is where he can schedule the procedure

## 2018-04-28 NOTE — Telephone Encounter (Signed)
I left pt message that I would need to check with Dr. Bonna Gains first to make sure we are okay to proceed since pt was last seen 07/16/2017.

## 2018-05-07 ENCOUNTER — Other Ambulatory Visit: Payer: Self-pay

## 2018-05-07 ENCOUNTER — Other Ambulatory Visit: Payer: Self-pay | Admitting: Family Medicine

## 2018-05-07 ENCOUNTER — Encounter: Payer: Self-pay | Admitting: Family Medicine

## 2018-05-07 ENCOUNTER — Ambulatory Visit: Payer: Medicare Other | Admitting: Family Medicine

## 2018-05-07 ENCOUNTER — Telehealth: Payer: Self-pay | Admitting: Gastroenterology

## 2018-05-07 VITALS — BP 154/84 | HR 86 | Temp 98.6°F | Resp 16 | Ht 75.0 in | Wt 221.0 lb

## 2018-05-07 DIAGNOSIS — F1021 Alcohol dependence, in remission: Secondary | ICD-10-CM

## 2018-05-07 DIAGNOSIS — I10 Essential (primary) hypertension: Secondary | ICD-10-CM

## 2018-05-07 DIAGNOSIS — Z125 Encounter for screening for malignant neoplasm of prostate: Secondary | ICD-10-CM

## 2018-05-07 DIAGNOSIS — Z0000 Encounter for general adult medical examination without abnormal findings: Secondary | ICD-10-CM

## 2018-05-07 DIAGNOSIS — R7309 Other abnormal glucose: Secondary | ICD-10-CM

## 2018-05-07 DIAGNOSIS — R7989 Other specified abnormal findings of blood chemistry: Secondary | ICD-10-CM

## 2018-05-07 DIAGNOSIS — E559 Vitamin D deficiency, unspecified: Secondary | ICD-10-CM

## 2018-05-07 NOTE — Assessment & Plan Note (Signed)
Stable, remains abstinent from alcohol No withdrawal symptoms Mood is controlled off substance  No new concerns. Follow-up as planned

## 2018-05-07 NOTE — Patient Instructions (Addendum)
Thank you for coming to the office today.  Hydrochlorothiazide 25mg  may run out in May 2020 - make sure you keep up with rx  BP elevated today if have not taken meds yet.  DUE for FASTING BLOOD WORK (no food or drink after midnight before the lab appointment, only water or coffee without cream/sugar on the morning of)  SCHEDULE "Lab Only" visit in the morning at the clinic for lab draw in 6 MONTHS  For Lab Results, once available within 2-3 days of blood draw, you can can log in to MyChart online to view your results and a brief explanation. Also, we can discuss results at next follow-up visit.   Please schedule a Follow-up Appointment to: Return in about 6 months (around 11/05/2018) for Annual Physical.  If you have any other questions or concerns, please feel free to call the office or send a message through [place]. You may also schedule an earlier appointment if necessary.  Additionally, you may be receiving a survey about your experience at our office within a few days to 1 week by e-mail or mail. We value your feedback.  [name], DO [place]

## 2018-05-07 NOTE — Assessment & Plan Note (Signed)
Today elevated BP - did not take meds today, otherwise had been improved Previously resistant HTN Home reading none today Prior meds: Failed CCB (Adalat, Amlodipine, swelling), Clonidine (alcohol wd only) - History of hyponatremia on thiazide Complicated by CKD-II - improved  Plan 1. Continue current BP regimen - Lisinopril 20mg , HCTZ 25mg  daily 2. Encourage improved lifestyle - low sodium diet, improve regular exercise 3. Continue monitor BP outside office, bring readings to next visit, if persistently >140/90 or new symptoms notify office sooner 4. Follow-up 6 months yearly

## 2018-05-07 NOTE — Telephone Encounter (Signed)
Patient called & l/m stating he has arrived at a date for his procedure.

## 2018-05-07 NOTE — Telephone Encounter (Signed)
Pt contacted and given instructions and dates available at this time. He will be finding someone to come with him for his EGD. In the meantime I will send via mail his prep instructions. He will call back with a date.

## 2018-05-07 NOTE — Progress Notes (Signed)
Subjective:    Patient ID: John Booker, male    DOB: 1950/08/20, 68 y.o.   MRN: [id number]  John Booker is a 68 y.o. male presenting on 05/07/2018 for Hypertension   HPI   John Booker HTN Last visit 10/2017. No new concerns today. He is feeling well and thinks BP is controlled. He is not taking BP outside office. -Current Meds -HCTZ 25mg  daily, Lisinopril40mg  daily - has not taken meds today, he occasional forgets in morning. Lifestyle: - Diet: stable, remains alcohol free, he drinks some water - occasional water, no sodas, balanced diet unsure on salt content of foods - Exercise:limited - He has returned to work part time Edema is resolved off amlodipine Denies CP, dyspnea, HA, edema, dizziness / lightheadedness  Alcohol Dependence, in full remission He remains alcohol free. History of mood disorder related to alcohol, now doing well.  Health Maintenance: Due Prevnar13, last dose Pneumovax23 03/10/2017 - now it is 1 year, he declines today reconsider next visit  Depression screen [facility] 2/9 05/07/2018 10/16/2017 07/14/2017  Decreased Interest 0 0 0  Down, Depressed, Hopeless 0 0 0  PHQ - 2 Score 0 0 0  Altered sleeping - - 0  Tired, decreased energy - - 0  Change in appetite - - 0  Feeling bad or failure about yourself  - - 0  Trouble concentrating - - 0  Moving slowly or fidgety/restless - - 0  Suicidal thoughts - - 0  PHQ-9 Score - - 0  Difficult doing work/chores - - Not difficult at all    Social History   Tobacco Use  . Smoking status: Former Smoker    Packs/day: 0.50    Years: 38.00    Pack years: 19.00    Types: Cigarettes  . Smokeless tobacco: Former Network engineer Use Topics  . Alcohol use: No  . Drug use: No    Review of Systems Per HPI unless specifically indicated above     Objective:    BP (!) 154/84 (BP Location: Left Arm, Cuff Size: Normal)   Pulse 86   Temp 98.6 F (37 C) (Oral)   Resp 16   Ht 6\' 3"  (1.905 m)   Wt 221 lb (100.2  kg)   BMI 27.62 kg/m   Wt Readings from Last 3 Encounters:  05/07/18 221 lb (100.2 kg)  10/16/17 218 lb (98.9 kg)  07/16/17 221 lb (100.2 kg)    Physical Exam Vitals signs and nursing note reviewed.  Constitutional:      General: He is not in acute distress.    Appearance: He is well-developed. He is not diaphoretic.     Comments: Well-appearing, comfortable, cooperative  HENT:     Head: Normocephalic and atraumatic.  Eyes:     General:        Right eye: No discharge.        Left eye: No discharge.     Conjunctiva/sclera: Conjunctivae normal.  Neck:     Musculoskeletal: Normal range of motion and neck supple.     Thyroid: No thyromegaly.  Cardiovascular:     Rate and Rhythm: Normal rate and regular rhythm.     Heart sounds: Normal heart sounds. No murmur.  Pulmonary:     Effort: Pulmonary effort is normal. No respiratory distress.     Breath sounds: Normal breath sounds. No wheezing or rales.  Abdominal:     Comments: Frequent mild hiccups, seems improved  Musculoskeletal: Normal range of motion.  Right lower leg: No edema.     Left lower leg: No edema.  Lymphadenopathy:     Cervical: No cervical adenopathy.  Skin:    General: Skin is warm and dry.     Findings: No erythema or rash.  Neurological:     Mental Status: He is alert and oriented to person, place, and time.  Psychiatric:        Behavior: Behavior normal.     Comments: Well groomed, good eye contact, normal speech and thoughts    Results for orders placed or performed in visit on 73/71/06  BASIC METABOLIC PANEL WITH GFR  Result Value Ref Range   Glucose, Bld 99 65 - 99 mg/dL   BUN 18 7 - 25 mg/dL   Creat 1.21 0.70 - 1.25 mg/dL   GFR, Est Non African American 61 > OR = 60 mL/min/1.41m2   GFR, Est African American 71 > OR = 60 mL/min/1.34m2   BUN/Creatinine Ratio NOT APPLICABLE 6 - 22 (calc)   Sodium 137 135 - 146 mmol/L   Potassium 4.2 3.5 - 5.3 mmol/L   Chloride 95 (L) 98 - 110 mmol/L   CO2 29  20 - 32 mmol/L   Calcium 10.3 8.6 - 10.3 mg/dL      Assessment & Plan:   Problem List Items Addressed This Visit    Alcohol dependence in sustained full remission ([place])    Stable, remains abstinent from alcohol No withdrawal symptoms Mood is controlled off substance  No new concerns. Follow-up as planned      Essential hypertension - Primary    Today elevated BP - did not take meds today, otherwise had been improved Previously resistant HTN Home reading none today Prior meds: Failed CCB (Adalat, Amlodipine, swelling), Clonidine (alcohol wd only) - History of hyponatremia on thiazide Complicated by CKD-II - improved  Plan 1. Continue current BP regimen - Lisinopril 20mg , HCTZ 25mg  daily 2. Encourage improved lifestyle - low sodium diet, improve regular exercise 3. Continue monitor BP outside office, bring readings to next visit, if persistently >140/90 or new symptoms notify office sooner 4. Follow-up 6 months yearly         No orders of the defined types were placed in this encounter.   Follow up plan: Return in about 6 months (around 11/05/2018) for Annual Physical.  Future labs ordered for 11/04/18  [name], [facility] 05/07/2018, 11:16 AM

## 2018-05-10 NOTE — Telephone Encounter (Signed)
LMTCO.

## 2018-05-13 NOTE — Telephone Encounter (Signed)
Patient called & has received his paperwork for his EGD but needs the date confirmed for 05-20-18. If not please pick another date of the ones that he was given (3-12,3-18,3-,19,3-20,3-25,& 3-26). Please contact patient.

## 2018-05-14 ENCOUNTER — Other Ambulatory Visit: Payer: Self-pay

## 2018-05-14 DIAGNOSIS — R066 Hiccough: Secondary | ICD-10-CM

## 2018-05-14 NOTE — Telephone Encounter (Signed)
Spoke with pt and 3/12 procedure day is now full. Scheduled for 05/26/18. Pt has prep instructions.

## 2018-05-25 ENCOUNTER — Encounter: Payer: Self-pay | Admitting: Anesthesiology

## 2018-05-26 ENCOUNTER — Encounter: Payer: Self-pay | Admitting: Emergency Medicine

## 2018-05-26 ENCOUNTER — Ambulatory Visit: Payer: Medicare Other | Admitting: Anesthesiology

## 2018-05-26 ENCOUNTER — Ambulatory Visit
Admission: RE | Admit: 2018-05-26 | Discharge: 2018-05-26 | Disposition: A | Discharge status: Home / Self Care | Payer: Medicare Other | Attending: Gastroenterology | Admitting: Gastroenterology

## 2018-05-26 ENCOUNTER — Encounter
Admission: RE | Disposition: A | Discharge status: Home / Self Care | Payer: Self-pay | Source: Home / Self Care | Attending: Gastroenterology

## 2018-05-26 ENCOUNTER — Other Ambulatory Visit: Payer: Self-pay

## 2018-05-26 DIAGNOSIS — K3189 Other diseases of stomach and duodenum: Secondary | ICD-10-CM

## 2018-05-26 DIAGNOSIS — I10 Essential (primary) hypertension: Secondary | ICD-10-CM | POA: Insufficient documentation

## 2018-05-26 DIAGNOSIS — Z87891 Personal history of nicotine dependence: Secondary | ICD-10-CM | POA: Diagnosis not present

## 2018-05-26 DIAGNOSIS — R066 Hiccough: Secondary | ICD-10-CM

## 2018-05-26 DIAGNOSIS — K449 Diaphragmatic hernia without obstruction or gangrene: Secondary | ICD-10-CM | POA: Diagnosis not present

## 2018-05-26 HISTORY — PX: ESOPHAGOGASTRODUODENOSCOPY (EGD) WITH PROPOFOL: SHX5813

## 2018-05-26 SURGERY — ESOPHAGOGASTRODUODENOSCOPY (EGD) WITH PROPOFOL
Anesthesia: General

## 2018-05-26 MED ORDER — SODIUM CHLORIDE 0.9 % IV SOLN
INTRAVENOUS | Status: DC
  Administered 2018-05-26: 1000 mL via INTRAVENOUS

## 2018-05-26 MED ORDER — GLYCOPYRROLATE 0.2 MG/ML IJ SOLN
INTRAMUSCULAR | Status: DC | PRN
  Administered 2018-05-26: 0.2 mg via INTRAVENOUS

## 2018-05-26 MED ORDER — PROPOFOL 10 MG/ML IV BOLUS
INTRAVENOUS | Status: DC | PRN
  Administered 2018-05-26: 40 mg via INTRAVENOUS
  Administered 2018-05-26: 20 mg via INTRAVENOUS
  Administered 2018-05-26: 80 mg via INTRAVENOUS
  Administered 2018-05-26: 30 mg via INTRAVENOUS
  Administered 2018-05-26: 20 mg via INTRAVENOUS

## 2018-05-26 MED ORDER — PROPOFOL 500 MG/50ML IV EMUL
INTRAVENOUS | Status: AC
  Filled 2018-05-26: qty 50

## 2018-05-26 NOTE — Transfer of Care (Signed)
Immediate Anesthesia Transfer of Care Note  Patient: John Booker  Procedure(s) Performed: ESOPHAGOGASTRODUODENOSCOPY (EGD) WITH PROPOFOL (N/A )  Patient Location: Endoscopy Unit  Anesthesia Type:General  Level of Consciousness: drowsy and patient cooperative  Airway & Oxygen Therapy: Patient Spontanous Breathing and Patient connected to nasal cannula oxygen  Post-op Assessment: Report given to RN and Post -op Vital signs reviewed and stable  Post vital signs: Reviewed and stable  Last Vitals:  Vitals Value Taken Time  BP 150/88 05/26/2018  9:30 AM  Temp 36.1 C 05/26/2018  9:30 AM  Pulse 103 05/26/2018  9:32 AM  Resp 16 05/26/2018  9:32 AM  SpO2 97 % 05/26/2018  9:32 AM  Vitals shown include unvalidated device data.  Last Pain:  Vitals:   05/26/18 0930  TempSrc: Tympanic  PainSc: Asleep         Complications: No apparent anesthesia complications

## 2018-05-26 NOTE — Anesthesia Preprocedure Evaluation (Signed)
Anesthesia Evaluation  Patient identified by MRN, date of birth, ID band Patient awake    Reviewed: Allergy & Precautions, NPO status , Patient's Chart, lab work & pertinent test results, reviewed documented beta blocker date and time   Airway Mallampati: III  TM Distance: >3 FB     Dental  (+) Chipped   Pulmonary former smoker,           Cardiovascular hypertension, Pt. on medications      Neuro/Psych PSYCHIATRIC DISORDERS    GI/Hepatic   Endo/Other    Renal/GU      Musculoskeletal   Abdominal   Peds  Hematology   Anesthesia Other Findings EKG shows iRbbb.  Reproductive/Obstetrics                             Anesthesia Physical Anesthesia Plan  ASA: III  Anesthesia Plan: General   Post-op Pain Management:    Induction: Intravenous  PONV Risk Score and Plan:   Airway Management Planned:   Additional Equipment:   Intra-op Plan:   Post-operative Plan:   Informed Consent: I have reviewed the patients History and Physical, chart, labs and discussed the procedure including the risks, benefits and alternatives for the proposed anesthesia with the patient or authorized representative who has indicated his/her understanding and acceptance.       Plan Discussed with: CRNA  Anesthesia Plan Comments:         Anesthesia Quick Evaluation

## 2018-05-26 NOTE — H&P (Signed)
Vonda Antigua, MD [address] [place], [place], [place], [address] [id number], [place], [place], [place], [address] Phone: [phone]  Fax: [phone]  Primary Care Physician:  Olin Hauser, DO   Pre-Procedure History & Physical: HPI:  John Booker is a 68 y.o. male is here for an EGD.   Past Medical History:  Diagnosis Date  . Hypertension     Past Surgical History:  Procedure Laterality Date  . INCISION AND DRAINAGE PERIRECTAL ABSCESS N/A 11/19/2015   Procedure: IRRIGATION AND DEBRIDEMENT PERIRECTAL ABSCESS and debridement of scrotal abscess;  Surgeon: Florene Glen, MD;  Location: [facility] ORS;  Service: General;  Laterality: N/A;  . INCISION AND DRAINAGE PERIRECTAL ABSCESS N/A 11/20/2015   Procedure: IRRIGATION AND DEBRIDEMENT PERIRECTAL ABSCESS / WITH DRESSING CHANGE;  Surgeon: Florene Glen, MD;  Location: [facility] ORS;  Service: General;  Laterality: N/A;  peri rectal site  . INCISION AND DRAINAGE PERIRECTAL ABSCESS N/A 11/21/2015   Procedure: IRRIGATION AND DEBRIDEMENT PERIRECTAL ABSCESS WITH DRESSING CHANGE;  Surgeon: Florene Glen, MD;  Location: [facility] ORS;  Service: General;  Laterality: N/A;  . [place]    Prior to Admission medications   Medication Sig Start Date End Date Taking? Authorizing Provider  hydrochlorothiazide (HYDRODIURIL) 25 MG tablet Take 1 tablet (25 mg total) by mouth daily. 07/14/17  Yes Karamalegos, Devonne Doughty, DO  lisinopril (PRINIVIL,ZESTRIL) 20 MG tablet Take 1 tablet (20 mg total) by mouth daily. 10/19/17  Yes Karamalegos, Devonne Doughty, DO  feeding supplement, ENSURE ENLIVE, (ENSURE ENLIVE) LIQD Take 237 mLs by mouth 2 (two) times daily between meals. Patient not taking: Reported on 10/16/2017 03/09/17   Epifanio Lesches, MD    Allergies as of 05/14/2018 - Review Complete 05/07/2018  Allergen Reaction Noted  . Adalat [nifedipine]  12/05/2015  . Amlodipine Other (See Comments) 07/14/2017     Family History  Problem Relation Age of Onset  . Hypertension Father     Social History   Socioeconomic History  . Marital status: Legally Separated    Spouse name: Not on file  . Number of children: Not on file  . Years of education: Not on file  . Highest education level: Not on file  Occupational History  . Not on file  Social Needs  . Financial resource strain: Not on file  . Food insecurity:    Worry: Not on file    Inability: Not on file  . Transportation needs:    Medical: Not on file    Non-medical: Not on file  Tobacco Use  . Smoking status: Former Smoker    Packs/day: 0.50    Years: 38.00    Pack years: 19.00    Types: Cigarettes  . Smokeless tobacco: Former Network engineer and Sexual Activity  . Alcohol use: No  . Drug use: No  . Sexual activity: Not on file  Lifestyle  . Physical activity:    Days per week: Not on file    Minutes per session: Not on file  . Stress: Not on file  Relationships  . Social connections:    Talks on phone: Not on file    Gets together: Not on file    Attends religious service: Not on file    Active member of club or organization: Not on file    Attends meetings of clubs or organizations: Not on file    Relationship status: Not on file  . Intimate partner violence:    Fear of  current or ex partner: Not on file    Emotionally abused: Not on file    Physically abused: Not on file    Forced sexual activity: Not on file  Other Topics Concern  . Not on file  Social History Narrative  . Not on file    Review of Systems: See HPI, otherwise negative ROS  Physical Exam: BP (!) 182/105   Pulse 74   Temp (!) 96.8 F (36 C) (Tympanic)   Resp 16   Ht 6\' 3"  (1.905 m)   Wt 99.8 kg   SpO2 100%   BMI 27.50 kg/m  General:   Alert,  pleasant and cooperative in NAD Head:  Normocephalic and atraumatic. Neck:  Supple; no masses or thyromegaly. Lungs:  Clear throughout to auscultation, normal respiratory effort.    Heart:   +S1, +S2, Regular rate and rhythm, No edema. Abdomen:  Soft, nontender and nondistended. Normal bowel sounds, without guarding, and without rebound.   Neurologic:  Alert and  oriented x4;  grossly normal neurologically.  Impression/Plan: John Booker is here for an EGD for intractable hiccups  Risks, benefits, limitations, and alternatives regarding the procedure have been reviewed with the patient.  Questions have been answered.  All parties agreeable.   Virgel Manifold, MD  05/26/2018, 9:29 AM

## 2018-05-26 NOTE — Anesthesia Post-op Follow-up Note (Signed)
Anesthesia QCDR form completed.        

## 2018-05-26 NOTE — Anesthesia Postprocedure Evaluation (Signed)
Anesthesia Post Note  Patient: John Booker  Procedure(s) Performed: ESOPHAGOGASTRODUODENOSCOPY (EGD) WITH PROPOFOL (N/A )  Patient location during evaluation: Endoscopy Anesthesia Type: General Level of consciousness: awake and alert Pain management: pain level controlled Vital Signs Assessment: post-procedure vital signs reviewed and stable Respiratory status: spontaneous breathing, nonlabored ventilation, respiratory function stable and patient connected to nasal cannula oxygen Cardiovascular status: blood pressure returned to baseline and stable Postop Assessment: no apparent nausea or vomiting Anesthetic complications: no     Last Vitals:  Vitals:   05/26/18 0950 05/26/18 1000  BP: (!) 155/94 (!) 163/99  Pulse: 88 86  Resp: 18 16  Temp:    SpO2: 100% 100%    Last Pain:  Vitals:   05/26/18 1000  TempSrc:   PainSc: 0-No pain                 Nykerria Macconnell S

## 2018-05-26 NOTE — Op Note (Signed)
[facility] Gastroenterology Patient Name: John Booker Procedure Date: 05/26/2018 7:17 AM MRN: [id number] Account #: [id number] Date of Birth: February 01, 1951 Admit Type: Outpatient Age: 68 Room: [facility] ENDO ROOM 4 Gender: Male Note Status: Finalized Procedure:            Upper GI endoscopy Indications:          Suspected esophageal reflux, Intractable hiccups Providers:             B. Bonna Gains MD, MD Referring MD:         Olin Hauser (Referring MD) Medicines:            Monitored Anesthesia Care Complications:        No immediate complications. Procedure:            Pre-Anesthesia Assessment:                       - Prior to the procedure, a History and Physical was                        performed, and patient medications, allergies and                        sensitivities were reviewed. The patient's tolerance of                        previous anesthesia was reviewed.                       - The risks and benefits of the procedure and the                        sedation options and risks were discussed with the                        patient. All questions were answered and informed                        consent was obtained.                       - Patient identification and proposed procedure were                        verified prior to the procedure by the physician, the                        nurse, the anesthesiologist, the anesthetist and the                        technician. The procedure was verified in the procedure                        room.                       - ASA Grade Assessment: II - A patient with mild                        systemic disease.  After obtaining informed consent, the endoscope was                        passed under direct vision. Throughout the procedure,                        the patient's blood pressure, pulse, and oxygen                        saturations were monitored  continuously. The Endoscope                        was introduced through the mouth, and advanced to the                        second part of duodenum. The upper GI endoscopy was                        accomplished with ease. The patient tolerated the                        procedure well. Findings:      The examined esophagus was normal.      Patchy mild mucosal changes characterized by thickened folds were found       at the gastroesophageal junction. Biopsies were taken with a cold       forceps for histology. This was seen at the gastric side of the GE       junction.      A small hiatal hernia was present.      Patchy mildly erythematous mucosa without bleeding was found in the       gastric antrum. Biopsies were taken with a cold forceps for histology.       Biopsies were obtained in the gastric body, at the incisura and in the       gastric antrum with cold forceps for histology.      The exam of the stomach was otherwise normal.      The duodenal bulb, second portion of the duodenum and examined duodenum       were normal. Impression:           - Normal esophagus.                       - Thickened folds mucosa in the gastroesophageal                        junction. Biopsied.                       - Small hiatal hernia.                       - Erythematous mucosa in the antrum. Biopsied.                       - Normal duodenal bulb, second portion of the duodenum                        and examined duodenum.                       - Biopsies  were obtained in the gastric body, at the                        incisura and in the gastric antrum. Recommendation:       - Referral to [facility] to evaluate if patient                        can benefit from hiatal hernia surgery due to                        intractable hiccups not relieved with PPI.                       - Await pathology results.                       - Discharge patient to home (with escort).                        - Advance diet as tolerated.                       - Continue present medications.                       - Patient has a contact number available for                        emergencies. The signs and symptoms of potential                        delayed complications were discussed with the patient.                        Return to normal activities tomorrow. Written discharge                        instructions were provided to the patient.                       - Discharge patient to home (with escort).                       - The findings and recommendations were discussed with                        the patient.                       - The findings and recommendations were discussed with                        the patient's family. Procedure Code(s):    --- Professional ---                       [phone], Esophagogastroduodenoscopy, flexible, transoral;                        with biopsy, single or multiple Diagnosis Code(s):    --- Professional ---  K31.89, Other diseases of stomach and duodenum                       K44.9, Diaphragmatic hernia without obstruction or                        gangrene CPT copyright 2018 American Medical Association. All rights reserved. The codes documented in this report are preliminary and upon coder review may  be revised to meet current compliance requirements.  Vonda Antigua, MD Margretta Sidle B. Bonna Gains MD, MD 05/26/2018 9:41:00 AM This report has been signed electronically. Number of Addenda: 0 Note Initiated On: 05/26/2018 7:17 AM Estimated Blood Loss: Estimated blood loss: none.      [facility]

## 2018-05-27 LAB — SURGICAL PATHOLOGY

## 2018-05-28 ENCOUNTER — Telehealth: Payer: Self-pay

## 2018-05-28 NOTE — Telephone Encounter (Signed)
-----   Message from Virgel Manifold, MD sent at 05/28/2018  8:58 AM EDT ----- Jackelyn Poling please let patient know, his biopsy results were benign. Please proceed with surgery referral as per procedure report.

## 2018-05-28 NOTE — Telephone Encounter (Signed)
Patient notified biopsy results are benign and surgery referral has been sent.  Thanks [employer]

## 2018-07-29 DIAGNOSIS — R066 Hiccough: Secondary | ICD-10-CM | POA: Diagnosis not present

## 2018-07-29 DIAGNOSIS — K449 Diaphragmatic hernia without obstruction or gangrene: Secondary | ICD-10-CM | POA: Diagnosis not present

## 2018-08-17 ENCOUNTER — Other Ambulatory Visit: Payer: Self-pay | Admitting: Family Medicine

## 2018-08-17 DIAGNOSIS — R600 Localized edema: Secondary | ICD-10-CM

## 2018-08-17 DIAGNOSIS — I10 Essential (primary) hypertension: Secondary | ICD-10-CM

## 2018-10-07 ENCOUNTER — Encounter: Payer: Self-pay | Admitting: Podiatry

## 2018-10-07 ENCOUNTER — Other Ambulatory Visit: Payer: Self-pay

## 2018-10-07 ENCOUNTER — Ambulatory Visit: Payer: Medicare Other | Admitting: Podiatry

## 2018-10-07 DIAGNOSIS — M216X1 Other acquired deformities of right foot: Secondary | ICD-10-CM

## 2018-10-07 DIAGNOSIS — L989 Disorder of the skin and subcutaneous tissue, unspecified: Secondary | ICD-10-CM | POA: Diagnosis not present

## 2018-10-07 DIAGNOSIS — L84 Corns and callosities: Secondary | ICD-10-CM | POA: Diagnosis not present

## 2018-10-07 NOTE — Progress Notes (Signed)
This patient presents the office with chief complaint of a blackened area on the outside ball of his right foot.  He says this is been developing for the last 6 months.  Patient states that this is not very painful but is sore at times.  He says he has provided no self treatment or sought any professional help.  He says it is most aggravated when he does a lot of walking.  He denies any redness or drainage noted from the blackened area on the outside of his right foot.  He presents the office today for an evaluation and treatment of his skin lesion right foot.  Vascular  Dorsalis pedis and posterior tibial pulses are not  palpable  B/L.  Capillary return  WNL.  Cold feet noted..  Skin turgor  WNL  Sensorium  Senn Weinstein monofilament wire  diminished . Normal tactile sensation.  Nail Exam  Patient has normal nails with no evidence of bacterial or fungal infection.  Orthopedic  Exam  Muscle tone and muscle strength  WNL.  No limitations of motion feet  B/L.  No crepitus or joint effusion noted.  Foot type is unremarkable and digits show no abnormalities.  Plantar flexed fifth metatarsal head right foot.   Skin  No open lesions.  Normal skin texture and turgor.  Hard callus present sub 5th met right foot.  Skin necrosis noted above the callus sub 5th right foot.  No redness or swelling or drainage 5th MPJ right foot.  Pre-ulcerous callus right foot.  Skin necrosis right foot.  Plantar flex fifth met  Right foot.  IE.  Debride callus/skin necrosis right foot.  Padding applied.  RTC 3 months.   Gardiner Barefoot DPM

## 2018-10-11 ENCOUNTER — Telehealth: Payer: Self-pay | Admitting: Family Medicine

## 2018-10-11 NOTE — Telephone Encounter (Signed)
I left a message asking the patient to call and schedule AWV-I with Tiffany. VDM (DD) °

## 2018-11-04 ENCOUNTER — Encounter: Payer: Self-pay | Admitting: Family Medicine

## 2018-11-30 ENCOUNTER — Ambulatory Visit (INDEPENDENT_AMBULATORY_CARE_PROVIDER_SITE_OTHER): Payer: Medicare Other | Admitting: Family Medicine

## 2018-11-30 ENCOUNTER — Encounter: Payer: Self-pay | Admitting: Family Medicine

## 2018-11-30 ENCOUNTER — Other Ambulatory Visit: Payer: Self-pay

## 2018-11-30 VITALS — BP 127/63 | HR 86 | Temp 98.5°F | Resp 18 | Ht 71.0 in | Wt 203.4 lb

## 2018-11-30 DIAGNOSIS — Z0000 Encounter for general adult medical examination without abnormal findings: Secondary | ICD-10-CM

## 2018-11-30 DIAGNOSIS — R7309 Other abnormal glucose: Secondary | ICD-10-CM | POA: Diagnosis not present

## 2018-11-30 DIAGNOSIS — F1021 Alcohol dependence, in remission: Secondary | ICD-10-CM | POA: Diagnosis not present

## 2018-11-30 DIAGNOSIS — R7989 Other specified abnormal findings of blood chemistry: Secondary | ICD-10-CM | POA: Diagnosis not present

## 2018-11-30 DIAGNOSIS — F1994 Other psychoactive substance use, unspecified with psychoactive substance-induced mood disorder: Secondary | ICD-10-CM | POA: Diagnosis not present

## 2018-11-30 DIAGNOSIS — R066 Hiccough: Secondary | ICD-10-CM

## 2018-11-30 DIAGNOSIS — I10 Essential (primary) hypertension: Secondary | ICD-10-CM

## 2018-11-30 DIAGNOSIS — Z125 Encounter for screening for malignant neoplasm of prostate: Secondary | ICD-10-CM

## 2018-11-30 MED ORDER — LISINOPRIL 20 MG PO TABS: 20.0000 mg | ORAL_TABLET | Freq: Every day | ORAL | 3 refills | Status: DC

## 2018-11-30 NOTE — Patient Instructions (Addendum)
Thank you for coming to the office today.  Refilled Lisinopril  Keep taking Baclofen as needed for hiccups  Follow back up with GI in [place] if ready to pursue further treatment.  Lab results to be called to you within 1 week.  DUE for FASTING BLOOD WORK (no food or drink after midnight before the lab appointment, only water or coffee without cream/sugar on the morning of)  SCHEDULE "Lab Only" visit in the morning at the clinic for lab draw in  1 YEAR  - Make sure Lab Only appointment is at about 1 week before your next appointment, so that results will be available  For Lab Results, once available within 2-3 days of blood draw, you can can log in to MyChart online to view your results and a brief explanation. Also, we can discuss results at next follow-up visit.   Please schedule a Follow-up Appointment to: Return in about 1 year (around 11/30/2019) for Annual Physical.  If you have any other questions or concerns, please feel free to call the office or send a message through [place]. You may also schedule an earlier appointment if necessary.  Additionally, you may be receiving a survey about your experience at our office within a few days to 1 week by e-mail or mail. We value your feedback.  [name], DO [place]

## 2018-11-30 NOTE — Assessment & Plan Note (Signed)
Controlled HTN now Normal home readings Prior meds: Failed CCB (Adalat, Amlodipine, swelling), Clonidine (alcohol wd only) - History of hyponatremia on thiazide Complicated by CKD-II - improved  Plan 1. Continue current BP regimen - Lisinopril 20mg , HCTZ 25mg  daily 2. Encourage improved lifestyle - low sodium diet, improve regular exercise 3. Continue monitor BP outside office, bring readings to next visit, if persistently >140/90 or new symptoms notify office sooner  F/u yearly

## 2018-11-30 NOTE — Assessment & Plan Note (Signed)
Resolved Off alcohol

## 2018-11-30 NOTE — Assessment & Plan Note (Signed)
Followed by GI Now on baclofen PRN with 50% success, may future follow up with GI for possible procedure for hiatal hernia 

## 2018-11-30 NOTE — Assessment & Plan Note (Signed)
Will repeat labs Cr trend

## 2018-11-30 NOTE — Assessment & Plan Note (Signed)
Stable, remains abstinent from alcohol No withdrawal symptoms Mood is controlled off substance

## 2018-11-30 NOTE — Progress Notes (Signed)
Subjective:    Patient ID: [name], male    DOB: 04-23-1950, 68 y.o.   MRN: [id number]  AARYA IWANOWSKI is a 68 y.o. male presenting on 11/30/2018 for Annual Exam (Physical)   HPI   Here for Annual Physical and fasting today due labs  [facility] HTN/ Bilateral Lower Extremity Edema Last visit 04/2018. No new concerns today. He is feeling well and thinks BP is controlled. Remains off Amlodipine. No further swelling Needs refill on Lisinopril -Current Meds -HCTZ 25mg  daily, Lisinopril20mg  daily Lifestyle: - Diet: stable, remains alcohol free, he drinks some water, balanced diet unsure on salt content of foods - Exercise:limited  Elevated Creatinine / Possible CKD-II Prior trend of mild to moderate elevated Cr. Consistent with CKD however improved on repeat from Cr 1.6 down to 1.2 at baseline, GFR >60 last lab Due for labs today  CHRONIC HICCUPS / BELCHING GAS Chronic problem followed by [facility] on PPI initially, also trial gabapentin. Now seen by GI in [place] - Taking Baclofen PRN for hiccups, only 50% effective, he was told small hiatal hernia but he prefers not to have surgery, if vomit then his hiccups usually resolve Admits still hiccups daily basis and some belching gas  Health Maintenance: Negative routine Hep C screening  Due for Flu Shot, declines today despite counseling on benefits  Due Prevnar-13 declined today despite counseling  Colon CA Screening: Last Colonoscopy >10 years ago done by outside GI, reported to have been done without sedation and he did not tolerate it well, he declines repeat colonoscopy at this time, also declines cologuard or other fecal testing non invasive, he would consider colonoscopy with sedation and is already patient of [facility], will reconsider with them when future return for EGD. No known fam history of colon CA    Depression screen [facility] 2/9 11/30/2018 11/30/2018 05/07/2018  Decreased Interest 0 0 0  Down, Depressed,  Hopeless 0 0 0  PHQ - 2 Score 0 0 0  Altered sleeping 0 - -  Tired, decreased energy 0 - -  Change in appetite 0 - -  Feeling bad or failure about yourself  0 - -  Trouble concentrating 0 - -  Moving slowly or fidgety/restless 0 - -  Suicidal thoughts 0 - -  PHQ-9 Score 0 - -  Difficult doing work/chores - - -    Past Medical History:  Diagnosis Date  . Hypertension    Past Surgical History:  Procedure Laterality Date  . ESOPHAGOGASTRODUODENOSCOPY (EGD) WITH PROPOFOL N/A 05/26/2018   Procedure: ESOPHAGOGASTRODUODENOSCOPY (EGD) WITH PROPOFOL;  Surgeon: Virgel Manifold, MD;  Location: [facility] ENDOSCOPY;  Service: Endoscopy;  Laterality: N/A;  . INCISION AND DRAINAGE PERIRECTAL ABSCESS N/A 11/19/2015   Procedure: IRRIGATION AND DEBRIDEMENT PERIRECTAL ABSCESS and debridement of scrotal abscess;  Surgeon: Florene Glen, MD;  Location: [facility] ORS;  Service: General;  Laterality: N/A;  . INCISION AND DRAINAGE PERIRECTAL ABSCESS N/A 11/20/2015   Procedure: IRRIGATION AND DEBRIDEMENT PERIRECTAL ABSCESS / WITH DRESSING CHANGE;  Surgeon: Florene Glen, MD;  Location: [facility] ORS;  Service: General;  Laterality: N/A;  peri rectal site  . INCISION AND DRAINAGE PERIRECTAL ABSCESS N/A 11/21/2015   Procedure: IRRIGATION AND DEBRIDEMENT PERIRECTAL ABSCESS WITH DRESSING CHANGE;  Surgeon: Florene Glen, MD;  Location: [facility] ORS;  Service: General;  Laterality: N/A;  . [place]   Social History   Socioeconomic History  . Marital status: Legally Separated    Spouse name: Not on file  .  Number of children: Not on file  . Years of education: Not on file  . Highest education level: Not on file  Occupational History  . Not on file  Social Needs  . Financial resource strain: Not on file  . Food insecurity    Worry: Not on file    Inability: Not on file  . Transportation needs    Medical: Not on file    Non-medical: Not on file  Tobacco Use  . Smoking status: Former Smoker     Packs/day: 0.50    Years: 38.00    Pack years: 19.00    Types: Cigarettes  . Smokeless tobacco: Former Network engineer and Sexual Activity  . Alcohol use: No  . Drug use: No  . Sexual activity: Not on file  Lifestyle  . Physical activity    Days per week: Not on file    Minutes per session: Not on file  . Stress: Not on file  Relationships  . Social Herbalist on phone: Not on file    Gets together: Not on file    Attends religious service: Not on file    Active member of club or organization: Not on file    Attends meetings of clubs or organizations: Not on file    Relationship status: Not on file  . Intimate partner violence    Fear of current or ex partner: Not on file    Emotionally abused: Not on file    Physically abused: Not on file    Forced sexual activity: Not on file  Other Topics Concern  . Not on file  Social History Narrative  . Not on file   Family History  Problem Relation Age of Onset  . Hypertension Father    Current Outpatient Medications on File Prior to Visit  Medication Sig  . baclofen (LIORESAL) 10 MG tablet   . hydrochlorothiazide (HYDRODIURIL) 25 MG tablet TAKE ONE TABLET BY MOUTH EVERY DAY   No current facility-administered medications on file prior to visit.     Review of Systems  Constitutional: Negative for activity change, appetite change, chills, diaphoresis, fatigue and fever.  HENT: Negative for congestion and hearing loss.   Eyes: Negative for visual disturbance.  Respiratory: Negative for apnea, cough, chest tightness, shortness of breath and wheezing.   Cardiovascular: Negative for chest pain, palpitations and leg swelling.  Gastrointestinal: Negative for abdominal pain, anal bleeding, blood in stool, constipation, diarrhea, nausea and vomiting.       Hiccups   Endocrine: Negative for cold intolerance.  Genitourinary: Negative for dysuria, frequency and hematuria.  Musculoskeletal: Negative for arthralgias and  neck pain.  Skin: Negative for rash.  Allergic/Immunologic: Negative for environmental allergies.  Neurological: Negative for dizziness, weakness, light-headedness, numbness and headaches.  Hematological: Negative for adenopathy.  Psychiatric/Behavioral: Negative for behavioral problems, dysphoric mood and sleep disturbance. The patient is not nervous/anxious.    Per HPI unless specifically indicated above      Objective:    BP 127/63 (BP Location: Left Arm, Patient Position: Sitting, Cuff Size: Normal)   Pulse 86   Temp 98.5 F (36.9 C) (Oral)   Resp 18   Ht 5\' 11"  (1.803 m)   Wt 203 lb 6.4 oz (92.3 kg)   SpO2 100%   BMI 28.37 kg/m   Wt Readings from Last 3 Encounters:  11/30/18 203 lb 6.4 oz (92.3 kg)  05/26/18 220 lb (99.8 kg)  05/07/18 221 lb (100.2 kg)  Physical Exam Vitals signs and nursing note reviewed.  Constitutional:      General: He is not in acute distress.    Appearance: He is well-developed. He is not diaphoretic.     Comments: Well-appearing, comfortable, cooperative  HENT:     Head: Normocephalic and atraumatic.  Eyes:     General:        Right eye: No discharge.        Left eye: No discharge.     Conjunctiva/sclera: Conjunctivae normal.     Pupils: Pupils are equal, round, and reactive to light.  Neck:     Musculoskeletal: Normal range of motion and neck supple.     Thyroid: No thyromegaly.     Comments: No carotid bruits Cardiovascular:     Rate and Rhythm: Normal rate and regular rhythm.     Heart sounds: Normal heart sounds. No murmur.  Pulmonary:     Effort: Pulmonary effort is normal. No respiratory distress.     Breath sounds: Normal breath sounds. No wheezing or rales.  Abdominal:     General: Bowel sounds are normal. There is no distension.     Palpations: Abdomen is soft. There is no mass.     Tenderness: There is no abdominal tenderness.     Comments: Active hiccups  Musculoskeletal: Normal range of motion.        General: No  tenderness.     Comments: Upper / Lower Extremities: - Normal muscle tone, strength bilateral upper extremities 5/5, lower extremities 5/5  Lymphadenopathy:     Cervical: No cervical adenopathy.  Skin:    General: Skin is warm and dry.     Findings: No erythema or rash.  Neurological:     Mental Status: He is alert and oriented to person, place, and time.     Comments: Distal sensation intact to light touch all extremities  Psychiatric:        Behavior: Behavior normal.     Comments: Well groomed, good eye contact, normal speech and thoughts    Results for orders placed or performed during the hospital encounter of 05/26/18  Surgical pathology  Result Value Ref Range   SURGICAL PATHOLOGY      Surgical Pathology CASE: [id number] PATIENT: [facility] Surgical Pathology Report     SPECIMEN SUBMITTED: A. Stomach, erythema r/o H. pylori B. GEJ, thickening; cbx  CLINICAL HISTORY: None provided  PRE-OPERATIVE DIAGNOSIS: R06.6 intractable hiccups  POST-OPERATIVE DIAGNOSIS: None provided.     DIAGNOSIS: A.  STOMACH ERYTHEMA; COLD BIOPSY: - ANTRAL AND OXYNTIC MUCOSA WITH CONGESTION. - NEGATIVE FOR H. PYLORI, INTESTINAL METAPLASIA, DYSPLASIA, AND MALIGNANCY.  B.  GASTROESOPHAGEAL JUNCTION THICKENING; COLD BIOPSY: - STRATIFIED SQUAMOUS EPITHELIUM WITH REACTIVE PARAKERATOSIS AND FOCAL MILD GLYCOGENIC ACANTHOSIS. - NO INTRAEPITHELIAL INFLAMMATORY CELLS ARE IDENTIFIED. - NEGATIVE FOR DYSPLASIA AND MALIGNANCY.  GROSS DESCRIPTION: A. Labeled: C BX gastric erythema to rule out H. pylori Received: Formalin Tissue fragment(s): Multiple Size: Aggregate, 0.5 x 0.2 x 0.1 cm Description: Tan soft tissue fragments Entirely submitted in 1 cassette.  B.  Labeled: C BX thickening GE junction Received: Formalin Tissue fragment(s): 1 Size: 0.2 cm Description: Pale-tan soft tissue fragment Entirely submitted in 1 cassette.   Final Diagnosis performed by Bryan Lemma, MD.    Electronically signed 05/27/2018 5:47:21PM The electronic signature indicates that the named Attending Pathologist has evaluated the specimen  Technical component performed at [facility], [address] [place], [place] [address] Lab: [phone] Dir: Rush Farmer, MD, MMM  Professional component performed  at [facility], [facility], [place], [place], [place] [address] Lab: [phone] Dir: Dellia Nims. Rubinas, MD       Assessment & Plan:   Problem List Items Addressed This Visit    Abnormal glucose   Alcohol dependence in sustained full remission (HCC)    Stable, remains abstinent from alcohol No withdrawal symptoms Mood is controlled off substance      Elevated serum creatinine    Will repeat labs Cr trend      Essential hypertension    Controlled HTN now Normal home readings Prior meds: Failed CCB (Adalat, Amlodipine, swelling), Clonidine (alcohol wd only) - History of hyponatremia on thiazide Complicated by CKD-II - improved  Plan 1. Continue current BP regimen - Lisinopril 20mg , HCTZ 25mg  daily 2. Encourage improved lifestyle - low sodium diet, improve regular exercise 3. Continue monitor BP outside office, bring readings to next visit, if persistently >140/90 or new symptoms notify office sooner  F/u yearly      Relevant Medications   lisinopril (ZESTRIL) 20 MG tablet   Intractable hiccups    Followed by GI Now on baclofen PRN with 50% success, may future follow up with GI for possible procedure for hiatal hernia      RESOLVED: Substance induced mood disorder ([place])    Resolved Off alcohol       Other Visit Diagnoses    Annual physical exam    -  Primary   Prostate cancer screening          Updated Health Maintenance information - declines flu vaccine, 2nd PNA vaccine dose, and colon cancer screening today despite counseling on benefits Reviewed recent lab orders - pending result, to be drawn today Encouraged improvement to  lifestyle with diet and exercise - Goal of weight loss   Meds ordered this encounter  Medications  . lisinopril (ZESTRIL) 20 MG tablet    Sig: Take 1 tablet (20 mg total) by mouth daily.    Dispense:  90 tablet    Refill:  3    Keep on file for when patient ready      Follow up plan: Return in about 1 year (around 11/30/2019) for Annual Physical.  [name], DO [facility] 11/30/2018, 8:14 AM

## 2018-12-01 LAB — CBC WITH DIFFERENTIAL/PLATELET
Absolute Monocytes: 437 cells/uL (ref 200–950)
Basophils Absolute: 32 cells/uL (ref 0–200)
Basophils Relative: 0.7 %
Eosinophils Absolute: 41 cells/uL (ref 15–500)
Eosinophils Relative: 0.9 %
HCT: 41.4 % (ref 38.5–50.0)
Hemoglobin: 13.6 g/dL (ref 13.2–17.1)
Lymphs Abs: 777 cells/uL — ABNORMAL LOW (ref 850–3900)
MCH: 31.7 pg (ref 27.0–33.0)
MCHC: 32.9 g/dL (ref 32.0–36.0)
MCV: 96.5 fL (ref 80.0–100.0)
MPV: 11.2 fL (ref 7.5–12.5)
Monocytes Relative: 9.5 %
Neutro Abs: 3312 cells/uL (ref 1500–7800)
Neutrophils Relative %: 72 %
Platelets: 202 10*3/uL (ref 140–400)
RBC: 4.29 10*6/uL (ref 4.20–5.80)
RDW: 12.3 % (ref 11.0–15.0)
Total Lymphocyte: 16.9 %
WBC: 4.6 10*3/uL (ref 3.8–10.8)

## 2018-12-01 LAB — COMPLETE METABOLIC PANEL WITH GFR
AG Ratio: 1.8 (calc) (ref 1.0–2.5)
ALT: 19 U/L (ref 9–46)
AST: 19 U/L (ref 10–35)
Albumin: 4.1 g/dL (ref 3.6–5.1)
Alkaline phosphatase (APISO): 71 U/L (ref 35–144)
BUN/Creatinine Ratio: 25 (calc) — ABNORMAL HIGH (ref 6–22)
BUN: 34 mg/dL — ABNORMAL HIGH (ref 7–25)
CO2: 27 mmol/L (ref 20–32)
Calcium: 9.6 mg/dL (ref 8.6–10.3)
Chloride: 104 mmol/L (ref 98–110)
Creat: 1.34 mg/dL — ABNORMAL HIGH (ref 0.70–1.25)
GFR, Est African American: 62 mL/min/{1.73_m2} (ref >=60)
GFR, Est Non African American: 54 mL/min/{1.73_m2} — ABNORMAL LOW (ref >=60)
Globulin: 2.3 g/dL (calc) (ref 1.9–3.7)
Glucose, Bld: 109 mg/dL — ABNORMAL HIGH (ref 65–99)
Potassium: 5.2 mmol/L (ref 3.5–5.3)
Sodium: 138 mmol/L (ref 135–146)
Total Bilirubin: 0.7 mg/dL (ref 0.2–1.2)
Total Protein: 6.4 g/dL (ref 6.1–8.1)

## 2018-12-01 LAB — LIPID PANEL
Cholesterol: 132 mg/dL (ref <200)
HDL: 74 mg/dL (ref >=40)
LDL Cholesterol (Calc): 45 mg/dL (calc)
Non-HDL Cholesterol (Calc): 58 mg/dL (calc) (ref <130)
Total CHOL/HDL Ratio: 1.8 (calc) (ref <5.0)
Triglycerides: 44 mg/dL (ref <150)

## 2018-12-01 LAB — PSA: PSA: 1.5 ng/mL (ref <=4.0)

## 2018-12-01 LAB — HEMOGLOBIN A1C
Hgb A1c MFr Bld: 5.4 % of total Hgb (ref <5.7)
Mean Plasma Glucose: 108 (calc)
eAG (mmol/L): 6 (calc)

## 2018-12-13 ENCOUNTER — Telehealth: Payer: Self-pay | Admitting: Family Medicine

## 2018-12-13 NOTE — Chronic Care Management (AMB) (Signed)
°  Chronic Care Management   Outreach Note  12/13/2018 Name: John Booker MRN: [id number] DOB: Sep 03, 1950  Referred by: Olin Hauser, DO Reason for referral : Chronic Care Management (Initial CCM outreach was unsuccessful. )   An unsuccessful telephone outreach was attempted today. The patient was referred to the case management team by for assistance with care management and care coordination.   Follow Up Plan: A HIPPA compliant phone message was left for the patient providing contact information and requesting a return call.  The care management team will reach out to the patient again over the next 7 days.  If patient returns call to provider office, please advise to call [place] at [place]  [email or web address]   ??[phone]

## 2018-12-20 NOTE — Chronic Care Management (AMB) (Signed)
°  Chronic Care Management   Outreach Note  12/20/2018 Name: John Booker MRN: [id number] DOB: 10/15/1950  Referred by: Olin Hauser, DO Reason for referral : Chronic Care Management (Initial CCM outreach was unsuccessful. ) and Chronic Care Management (Second CCM outreach was unsuccessful. )   A second unsuccessful telephone outreach was attempted today. The patient was referred to the case management team for assistance with care management and care coordination.   Follow Up Plan: A HIPPA compliant phone message was left for the patient providing contact information and requesting a return call.  The care management team will reach out to the patient again over the next 7 days.  If patient returns call to provider office, please advise to call [place] at [place]  [email or web address]   ??[phone]

## 2018-12-28 NOTE — Chronic Care Management (AMB) (Signed)
°  Chronic Care Management   Outreach Note  12/28/2018 Name: John Booker MRN: [id number] DOB: [birth year]  Referred by: Olin Hauser, DO Reason for referral : Chronic Care Management (Initial CCM outreach was unsuccessful. ), Chronic Care Management (Second CCM outreach was unsuccessful. ), and Chronic Care Management (Third CCM outreach was unsuccessful. )   Third unsuccessful telephone outreach was attempted today. The patient was referred to the case management team for assistance with care management and care coordination. The patient's primary care provider has been notified of our unsuccessful attempts to make or maintain contact with the patient. The care management team is pleased to engage with this patient at any time in the future should he/she be interested in assistance from the care management team.   Follow Up Plan: The care management team is available to follow up with the patient after provider conversation with the patient regarding recommendation for care management engagement and subsequent re-referral to the care management team.   [place]  [email or web address]   ??[phone]

## 2019-01-06 ENCOUNTER — Ambulatory Visit: Payer: Medicare Other | Admitting: Podiatry

## 2019-01-27 ENCOUNTER — Encounter: Payer: Self-pay | Admitting: Podiatry

## 2019-01-27 ENCOUNTER — Other Ambulatory Visit: Payer: Self-pay

## 2019-01-27 ENCOUNTER — Ambulatory Visit (INDEPENDENT_AMBULATORY_CARE_PROVIDER_SITE_OTHER): Payer: Medicare Other

## 2019-01-27 ENCOUNTER — Ambulatory Visit: Payer: Medicare Other | Admitting: Podiatry

## 2019-01-27 DIAGNOSIS — M216X1 Other acquired deformities of right foot: Secondary | ICD-10-CM | POA: Diagnosis not present

## 2019-01-27 DIAGNOSIS — L97511 Non-pressure chronic ulcer of other part of right foot limited to breakdown of skin: Secondary | ICD-10-CM | POA: Diagnosis not present

## 2019-01-27 NOTE — Progress Notes (Signed)
This patient presents the office with chief complaint of a painful callus on the outside ball of his right foot.  He says this callus has been bleeding and draining the last few weeks.  He was seen initially in July 2020 by myself for a blister that was treated and has resolved.  He says that he was supposed to return in October for an evaluation but work would not allow him to keep his appointment.  Patient states that he works outside and is very active walking and working.  He states this area is sore at the site of the callus.  He presents the office today for an evaluation and treatment of his painful callus.  Vascular  Dorsalis pedis and posterior tibial pulses are minimally  palpable  B/L.  Capillary return  WNL.  Cold feet  B/L.    Skin turgor  WNL  Sensorium  Senn Weinstein monofilament wire  Absent right foot. . Normal tactile sensation.  Nail Exam  Patient has normal nails with no evidence of bacterial or fungal infection.  Orthopedic  Exam  Muscle tone and muscle strength  WNL.  No limitations of motion feet  B/L.  No crepitus or joint effusion noted.  Foot type is unremarkable and digits show no abnormalities.  Bony prominences are unremarkable.  Skin  No open lesions.  Normal skin texture and turgor.  Open patient has a hard callus on the roof of his fifth MPJ right foot.  No evidence of any redness swelling or drainage noted today.  The hard callus measures approximately 25 x 20.  Neurotropic ulcer sub 5th met right foot.    ROV.  X-rays were taken for evaluation of the fifth metatarsal right foot.  X-rays reveal no evidence of any cystic changes but no evidence of plantar condyles are noted.  Debridement of the roof of the callus does reveal an ulcer in the center of the site which measures approximately 15 mm x 15 mm.  Healthy granulation tissue is noted at the site of the ulcer.  No infection is noted.  Applied Neosporin dry sterile dressing bandage to his right foot.  Retested his L  OPS and found his sensorium to be absent on all 5 toes right foot.  Discussed this condition with this patient.  He declined to wear a cam walker.  He declined to wear a surgical shoe.  He states he is unable to wear either at work since his work requires him to work outside.  Home soak instructions were given to this patient.  Schedule an appointment with Liliane Channel  tomorrow to be evaluated for an off weightbearing orthotic in his shoes.  Told him to return to the office in 7 to 10 days for reevaluation of the neuropathic ulcer right foot.If this condition worsens or becomes very painful, the patient was told to contact this office or go to the Emergency Department at the hospital.  Gardiner Barefoot DPM

## 2019-01-28 ENCOUNTER — Ambulatory Visit: Payer: Medicare Other | Admitting: Orthotics

## 2019-01-28 DIAGNOSIS — M216X1 Other acquired deformities of right foot: Secondary | ICD-10-CM

## 2019-01-28 DIAGNOSIS — L97511 Non-pressure chronic ulcer of other part of right foot limited to breakdown of skin: Secondary | ICD-10-CM

## 2019-01-28 NOTE — Progress Notes (Signed)
Gave patient offload diabetic insert; advised that he MUST keep appointments with Dr. Prudence Davidson.  Advised about seriousness of graded ulcer to neuropathic foot.  Told him

## 2019-02-07 ENCOUNTER — Other Ambulatory Visit: Payer: Self-pay

## 2019-02-07 ENCOUNTER — Encounter: Payer: Self-pay | Admitting: Podiatry

## 2019-02-07 ENCOUNTER — Ambulatory Visit (INDEPENDENT_AMBULATORY_CARE_PROVIDER_SITE_OTHER): Payer: Medicare Other | Admitting: Podiatry

## 2019-02-07 DIAGNOSIS — M216X1 Other acquired deformities of right foot: Secondary | ICD-10-CM

## 2019-02-07 DIAGNOSIS — L97511 Non-pressure chronic ulcer of other part of right foot limited to breakdown of skin: Secondary | ICD-10-CM | POA: Diagnosis not present

## 2019-02-07 DIAGNOSIS — L84 Corns and callosities: Secondary | ICD-10-CM | POA: Diagnosis not present

## 2019-02-07 NOTE — Patient Outreach (Signed)
[facility]) Care Management  02/07/2019  John Booker 06/24/1950 [id number]   Medication Adherence call to John Booker [facility] Ident[facility] Verify spoke with patient he is past due on Lisinopril 20 mg,patient explain he is taking 1 tablet daily,patient will order tomorrow and will pick up from [place]. [name] is showing past due under [place].   [facility] Management Direct Dial [phone]  Fax [phone] [email or web address]

## 2019-02-07 NOTE — Progress Notes (Signed)
This patient presents the office with chief complaint of a painful callus on the outside ball of his right foot.  He Was diagnosed with a neurotrophic ulcer subfifth metatarsal right foot.  He saw Liliane Channel last Wednesday and received a diabetic insole with a dispersion pad subfifth metatarsal.  He presents the office today stating that he still has pain but feels better.  He says the insole that was dispensed makes his right leg taller causing pain by the end of the day.  He has been providing local wound care for this ulcer.  He says he has not seen any drainage from the ulcer since his last visit.   He presents the office today for continued evaluation and treatment of his preulcerous lesion/ulcer subfifth metatarsal right foot.  Vascular  Dorsalis pedis and posterior tibial pulses are minimally  palpable  B/L.  Capillary return  WNL.  Cold feet  B/L.    Skin turgor  WNL  Sensorium  Senn Weinstein monofilament wire  Absent right foot. . Normal tactile sensation.  Nail Exam  Patient has normal nails with no evidence of bacterial or fungal infection.  Orthopedic  Exam  Muscle tone and muscle strength  WNL.  No limitations of motion feet  B/L.  No crepitus or joint effusion noted.  Foot type is unremarkable and digits show no abnormalities.  Bony prominences are unremarkable.  Skin    Normal skin texture and turgor.    No evidence of any redness swelling or drainage noted today.  The hard callus measures approximately 25 x 20.  There is no evidence of any granulation tissue or drainage noted at the ulceration right foot.  Neurotropic ulcer sub 5th met right foot.    ROV.  Examination of the ulcer reveals the ulcer has completely healed with hyperkeratotic tissue at the site of the granulation tissue.  No evidence of redness swelling or infection.  Took out his shoe insole and removed from the shoe and replaced it with the insoles made by [employer].  Upon standing he says it feels much better now that he is  level.  The callus tissue was debrided utilizing a dremel  Tool.  Patient has significantly healed but he was reappointed to return to the office in 4 weeks for continued evaluation and treatment.  If this area breaks down he needs to come into the office sooner for evaluation and treatment.  Gardiner Barefoot DPM

## 2019-02-25 ENCOUNTER — Telehealth: Payer: Self-pay | Admitting: Family Medicine

## 2019-02-25 NOTE — Telephone Encounter (Signed)
I called the patient to schedule AWV-Initial with Tiffany, but he declined. VDM (DD)

## 2019-03-07 ENCOUNTER — Ambulatory Visit: Payer: Medicare Other | Admitting: Podiatry

## 2019-03-07 ENCOUNTER — Other Ambulatory Visit: Payer: Self-pay

## 2019-03-07 ENCOUNTER — Encounter: Payer: Self-pay | Admitting: Podiatry

## 2019-03-07 DIAGNOSIS — M216X1 Other acquired deformities of right foot: Secondary | ICD-10-CM | POA: Diagnosis not present

## 2019-03-07 DIAGNOSIS — L97511 Non-pressure chronic ulcer of other part of right foot limited to breakdown of skin: Secondary | ICD-10-CM | POA: Diagnosis not present

## 2019-03-07 DIAGNOSIS — L84 Corns and callosities: Secondary | ICD-10-CM | POA: Diagnosis not present

## 2019-03-07 NOTE — Progress Notes (Signed)
This patient presents the office with chief complaint of a  callus on the outside ball of his right foot.  Patient was initially diagnosed with neurotropic ulcer sub 5th met right foot.   He has been seen for treatment of ulcer by myself and is wearing an modified insole made by [employer].  He says he is doing much better and has seen no bleeding or drainage from the ulcer.  He presents to the office for continued evaluation and treatment.  Vascular  Dorsalis pedis and posterior tibial pulses are minimally  palpable  B/L.  Capillary return  WNL.  Cold feet  B/L.    Skin turgor  WNL  Sensorium  Senn Weinstein monofilament wire  absent right foot. .Diminished  tactile sensation right foot.. Diminished LOPS left foot.  Nail Exam  Patient has normal nails with no evidence of bacterial or fungal infection.  Orthopedic  Exam  Muscle tone and muscle strength  WNL.  No limitations of motion feet  B/L.  No crepitus or joint effusion noted.  Foot type is unremarkable and digits show no abnormalities.  Plantar flexed fifth metatarsal right foot.  Skin    Normal skin texture and turgor.    No evidence of any redness swelling or drainage noted today.  The hard callus measures approximately 25 x 20.  There is no evidence of any granulation tissue or drainage noted at the ulceration right foot.  Neurotropic ulcer sub 5th met right foot.    ROV.  Examination of the ulcer reveals the ulcer has completely healed with hyperkeratotic tissue at the site of the granulation tissue.  No evidence of redness swelling or infection.    The callus tissue was debrided utilizing a dremel  tool.  Patient has significantly healed and  he was reappointed to return to the office in 4 weeks for continued evaluation and treatment.  If this area breaks down he needs to come into the office sooner for evaluation and treatment. Continue limiting activity and was told to wear his off weightbearing insole.  Gardiner Barefoot DPM

## 2019-04-04 ENCOUNTER — Other Ambulatory Visit: Payer: Self-pay

## 2019-04-04 ENCOUNTER — Ambulatory Visit: Payer: Medicare Other | Admitting: Podiatry

## 2019-04-04 ENCOUNTER — Encounter: Payer: Self-pay | Admitting: Podiatry

## 2019-04-04 DIAGNOSIS — G629 Polyneuropathy, unspecified: Secondary | ICD-10-CM | POA: Insufficient documentation

## 2019-04-04 DIAGNOSIS — M216X1 Other acquired deformities of right foot: Secondary | ICD-10-CM | POA: Diagnosis not present

## 2019-04-04 DIAGNOSIS — L84 Corns and callosities: Secondary | ICD-10-CM

## 2019-04-04 NOTE — Progress Notes (Signed)
This patient presents the office with chief complaint of a  callus on the outside ball of his right foot.  Patient was initially diagnosed with neurotropic ulcer sub 5th met right foot.   He has been seen for treatment of ulcer by myself and is wearing an modified insole made by [employer].  He says he is doing much better and has seen no bleeding or drainage from the ulcer.  He presents to the office for continued evaluation and treatment.  Vascular  Dorsalis pedis and posterior tibial pulses are minimally  palpable  B/L.  Capillary return  WNL.  Cold feet  B/L.    Skin turgor  WNL  Sensorium  Senn Weinstein monofilament wire  absent right foot. .Diminished  tactile sensation right foot.. Diminished LOPS left foot.  Nail Exam  Patient has normal nails with no evidence of bacterial or fungal infection.  Orthopedic  Exam  Muscle tone and muscle strength  WNL.  No limitations of motion feet  B/L.  No crepitus or joint effusion noted.  Foot type is unremarkable and digits show no abnormalities.  Plantar flexed fifth metatarsal right foot.  Skin    Normal skin texture and turgor.    No evidence of any redness swelling or drainage noted today.  The hard callus measures approximately 25 x 20.  There is no evidence of any granulation tissue or drainage noted at the ulceration right foot.  Neurotropic ulcer sub 5th met right foot.    ROV.  Examination of the ulcer reveals the ulcer has completely healed with hyperkeratotic tissue at the site of the granulation tissue.  No evidence of redness swelling or infection.    The callus tissue was debrided utilizing a dremel  tool.  Patient has significantly healed and  he was reappointed to return to the office in 8  weeks for continued evaluation and treatment.  If this area breaks down he needs to come into the office sooner for evaluation and treatment. Continue limiting activity and was told to wear his off weightbearing insole.  Gardiner Barefoot DPM

## 2019-04-29 ENCOUNTER — Other Ambulatory Visit: Payer: Self-pay | Admitting: Family Medicine

## 2019-04-29 DIAGNOSIS — R066 Hiccough: Secondary | ICD-10-CM

## 2019-04-29 MED ORDER — BACLOFEN 10 MG PO TABS: 10.0000 mg | ORAL_TABLET | Freq: Two times a day (BID) | ORAL | 2 refills | Status: DC | PRN

## 2019-04-29 NOTE — Telephone Encounter (Signed)
Pt. called  requesting refill on  baclofen

## 2019-05-30 ENCOUNTER — Ambulatory Visit: Payer: Medicare Other | Admitting: Podiatry

## 2019-05-30 ENCOUNTER — Encounter: Payer: Self-pay | Admitting: Podiatry

## 2019-05-30 ENCOUNTER — Other Ambulatory Visit: Payer: Self-pay

## 2019-05-30 VITALS — Temp 98.2°F

## 2019-05-30 DIAGNOSIS — M216X1 Other acquired deformities of right foot: Secondary | ICD-10-CM

## 2019-05-30 DIAGNOSIS — L84 Corns and callosities: Secondary | ICD-10-CM | POA: Diagnosis not present

## 2019-05-30 DIAGNOSIS — L989 Disorder of the skin and subcutaneous tissue, unspecified: Secondary | ICD-10-CM

## 2019-05-30 NOTE — Progress Notes (Signed)
This patient presents the office with chief complaint of a  callus on the outside ball of his right foot.  Patient was initially diagnosed with neurotropic ulcer sub 5th met right foot.   He has been seen for treatment of ulcer by myself and is wearing an modified insole made by [employer].  He says he is doing much better and has seen no bleeding or drainage from the ulcer.  He presents to the office for continued evaluation and treatment.  Vascular  Dorsalis pedis and posterior tibial pulses are minimally  palpable  B/L.  Capillary return  WNL.  Cold feet  B/L.    Skin turgor  WNL  Sensorium  Senn Weinstein monofilament wire  absent right foot. .Diminished  tactile sensation right foot.. Diminished LOPS left foot.  Nail Exam  Patient has normal nails with no evidence of bacterial or fungal infection.  Orthopedic  Exam  Muscle tone and muscle strength  WNL.  No limitations of motion feet  B/L.  No crepitus or joint effusion noted.  Foot type is unremarkable and digits show no abnormalities.  Plantar flexed fifth metatarsal right foot.  Skin    Normal skin texture and turgor.    No evidence of any redness swelling or drainage noted today.  The callus sub 5th metatarsal right foot is covered by callus/hyperkerratotic skin.  There is no evidence of any granulation tissue or drainage noted at the ulceration right foot. There is a newly developed pre-ulcerous sub 1 right foot.  Neurotropic ulcer sub 5th met right foot.    ROV.  Examination of the ulcer reveals the ulcer has completely healed with hyperkeratotic tissue at the site of the granulation tissue.  No evidence of redness swelling or infection.    The callus tissue was debrided utilizing a dremel  tool.  Patient has significantly healed and  he was reappointed to return to the office in 6  weeks for continued evaluation and treatment.  If this area breaks down he needs to come into the office sooner for evaluation and treatment. Patient was told he has  developed a new lesion sub 1 right foot.  Therefore I made an appointment for him with [facility].for additional off weightbearing sub 1 right orthoses.  Gardiner Barefoot DPM

## 2019-06-14 ENCOUNTER — Other Ambulatory Visit: Payer: Self-pay | Admitting: Family Medicine

## 2019-06-14 DIAGNOSIS — R066 Hiccough: Secondary | ICD-10-CM

## 2019-06-15 ENCOUNTER — Other Ambulatory Visit: Payer: Self-pay

## 2019-06-15 ENCOUNTER — Ambulatory Visit: Payer: Medicare Other | Admitting: Orthotics

## 2019-06-15 DIAGNOSIS — L989 Disorder of the skin and subcutaneous tissue, unspecified: Secondary | ICD-10-CM

## 2019-06-15 DIAGNOSIS — L84 Corns and callosities: Secondary | ICD-10-CM

## 2019-06-15 DIAGNOSIS — M216X1 Other acquired deformities of right foot: Secondary | ICD-10-CM

## 2019-06-15 NOTE — Progress Notes (Signed)
Patient decided not to get custom f/o due to financial considerations; wants to keep using the modified one I make him.

## 2019-07-11 ENCOUNTER — Other Ambulatory Visit: Payer: Self-pay

## 2019-07-11 ENCOUNTER — Encounter: Payer: Self-pay | Admitting: Podiatry

## 2019-07-11 ENCOUNTER — Ambulatory Visit: Payer: Medicare Other | Admitting: Podiatry

## 2019-07-11 VITALS — Temp 98.6°F

## 2019-07-11 DIAGNOSIS — G629 Polyneuropathy, unspecified: Secondary | ICD-10-CM

## 2019-07-11 DIAGNOSIS — L84 Corns and callosities: Secondary | ICD-10-CM

## 2019-07-11 DIAGNOSIS — M216X1 Other acquired deformities of right foot: Secondary | ICD-10-CM | POA: Diagnosis not present

## 2019-07-11 NOTE — Progress Notes (Signed)
This patient returns to my office for at risk foot care.  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   This patient is unable to treat the ulcer  since the patient cannot reach his ulcer.No pain at site of ulcer right foot. This patient presents for at risk foot care today.  General Appearance  Alert, conversant and in no acute stress.  Vascular  Dorsalis pedis and posterior tibial  pulses are minimally  palpable  bilaterally.  Capillary return is within normal limits  bilaterally. Temperature is within normal limits  bilaterally.  Neurologic  Senn-Weinstein monofilament wire test absent right foot.  Diminished LOPS left foot.. Muscle power within normal limits bilaterally.  Nails Normal nails noted both feet. No evidence of bacterial infection or drainage bilaterally.  Orthopedic  No limitations of motion  feet .  No crepitus or effusions noted.  No bony pathology or digital deformities noted. Plantar flexed fifth metatarsal right foot.  Skin  normotropic skin with no porokeratosis noted bilaterally.  No signs of infections or ulcers noted.   Hemorrhagic callus sub 5th met right foot.  Pre-ulcerous callus right foot.  Consent was obtained for treatment procedures. Debridement of callus right foot with # 15 blade.   Padding dispensed and applied to diabetic insole.    Return office visit  6 weeks                   Told patient to return for periodic foot care and evaluation due to potential at risk complications.   Gardiner Barefoot DPM

## 2019-07-15 ENCOUNTER — Other Ambulatory Visit: Payer: Self-pay | Admitting: Family Medicine

## 2019-07-15 DIAGNOSIS — R600 Localized edema: Secondary | ICD-10-CM

## 2019-07-15 DIAGNOSIS — I10 Essential (primary) hypertension: Secondary | ICD-10-CM

## 2019-07-15 NOTE — Telephone Encounter (Signed)
Requested Prescriptions  Pending Prescriptions Disp Refills  . hydrochlorothiazide (HYDRODIURIL) 25 MG tablet [Pharmacy Med Name: HYDROCHLOROTHIAZIDE 25 MG TAB] 90 tablet 1    Sig: TAKE 1 TABLET BY MOUTH DAILY     Cardiovascular: Diuretics - Thiazide Failed - 07/15/2019  4:03 PM      Failed - Cr in normal range and within 360 days    Creat  Date Value Ref Range Status  11/30/2018 1.34 (H) 0.70 - 1.25 mg/dL Final    Comment:    For patients >71 years of age, the reference limit for Creatinine is approximately 13% higher for people identified as African-American. .          Failed - Valid encounter within last 6 months    Recent Outpatient Visits          7 months ago Annual physical exam   [facility] Olin Hauser, DO   1 year ago Essential hypertension   [place], Devonne Doughty, DO   1 year ago Annual physical exam   [facility] Olin Hauser, DO   2 years ago Essential hypertension   [place], Devonne Doughty, DO   2 years ago Essential hypertension   [place], [place]             Passed - Ca in normal range and within 360 days    Calcium  Date Value Ref Range Status  11/30/2018 9.6 8.6 - 10.3 mg/dL Final   Calcium, Total  Date Value Ref Range Status  04/15/2012 8.5 8.5 - 10.1 mg/dL Final         Passed - K in normal range and within 360 days    Potassium  Date Value Ref Range Status  11/30/2018 5.2 3.5 - 5.3 mmol/L Final  04/15/2012 3.5 3.5 - 5.1 mmol/L Final         Passed - Na in normal range and within 360 days    Sodium  Date Value Ref Range Status  11/30/2018 138 135 - 146 mmol/L Final  04/15/2012 135 (L) 136 - 145 mmol/L Final         Passed - Last BP in normal range    BP Readings from Last 1 Encounters:  11/30/18 127/63         Phone call to pt.  Left vm to advise he will need to sched. An  annual physical in Sept.  Refilled HCTZ ; #90, RF x 1.

## 2019-07-27 ENCOUNTER — Other Ambulatory Visit: Payer: Self-pay | Admitting: Family Medicine

## 2019-07-27 DIAGNOSIS — R066 Hiccough: Secondary | ICD-10-CM

## 2019-07-27 MED ORDER — BACLOFEN 10 MG PO TABS: ORAL_TABLET | ORAL | 2 refills | Status: DC

## 2019-07-27 NOTE — Telephone Encounter (Signed)
Requested medication (s) are due for refill today - unsure  Requested medication (s) are on the active medication list -yes  Future visit scheduled -no  Last refill: 06/14/19 2RF  Notes to clinic: Request for non delegated Rx  Requested Prescriptions  Pending Prescriptions Disp Refills   baclofen (LIORESAL) 10 MG tablet 30 each 2    Sig: TAKE ONE TABLET TWICE DAILY AS NEEDED FOR MUSCLE SPASM      Not Delegated - Analgesics:  Muscle Relaxants Failed - 07/27/2019 10:27 AM      Failed - This refill cannot be delegated      Failed - Valid encounter within last 6 months    Recent Outpatient Visits           7 months ago Annual physical exam   [facility] Olin Hauser, DO   1 year ago Essential hypertension   [place], Devonne Doughty, DO   1 year ago Annual physical exam   [facility] Olin Hauser, DO   2 years ago Essential hypertension   [place], Devonne Doughty, DO   2 years ago Essential hypertension   [place], [place]                  Requested Prescriptions  Pending Prescriptions Disp Refills   baclofen (LIORESAL) 10 MG tablet 30 each 2    Sig: TAKE ONE TABLET TWICE DAILY AS NEEDED FOR MUSCLE SPASM      Not Delegated - Analgesics:  Muscle Relaxants Failed - 07/27/2019 10:27 AM      Failed - This refill cannot be delegated      Failed - Valid encounter within last 6 months    Recent Outpatient Visits           7 months ago Annual physical exam   [facility] Olin Hauser, DO   1 year ago Essential hypertension   [place], Devonne Doughty, DO   1 year ago Annual physical exam   [facility] Olin Hauser, DO   2 years ago Essential hypertension   [place], Devonne Doughty, DO   2 years ago Essential  hypertension   [place], Devonne Doughty, DO

## 2019-07-27 NOTE — Telephone Encounter (Signed)
PT need a refill baclofen (LIORESAL) 10 MG tablet BP:[id number]

## 2019-08-22 ENCOUNTER — Encounter: Payer: Self-pay | Admitting: Podiatry

## 2019-08-22 ENCOUNTER — Ambulatory Visit: Payer: Medicare Other | Admitting: Podiatry

## 2019-08-22 ENCOUNTER — Other Ambulatory Visit: Payer: Self-pay

## 2019-08-22 DIAGNOSIS — L84 Corns and callosities: Secondary | ICD-10-CM

## 2019-08-22 DIAGNOSIS — M216X1 Other acquired deformities of right foot: Secondary | ICD-10-CM | POA: Diagnosis not present

## 2019-08-22 NOTE — Progress Notes (Signed)
This patient returns to my office for at risk foot care.  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   This patient is unable to treat the ulcer  since the patient cannot reach his ulcer.No pain at site of ulcer right foot.  No drainage noted.  His pain is less wearing his work shoes with padding. This patient presents for at risk foot care today.  General Appearance  Alert, conversant and in no acute stress.  Vascular  Dorsalis pedis and posterior tibial  pulses are minimally  palpable  bilaterally.  Capillary return is within normal limits  bilaterally. Temperature is within normal limits  bilaterally.  Neurologic  Senn-Weinstein monofilament wire test absent right foot.  Diminished LOPS left foot.. Muscle power within normal limits bilaterally.  Nails Normal nails noted both feet. No evidence of bacterial infection or drainage bilaterally.  Orthopedic  No limitations of motion  feet .  No crepitus or effusions noted.  No bony pathology or digital deformities noted. Plantar flexed fifth metatarsal right foot.  Skin  normotropic skin with no porokeratosis noted bilaterally.  No signs of infections or ulcers noted.   Hemorrhagic callus sub 5th met right foot.  [place] 25 mm. X 20 mm.  Pre-ulcerous callus right foot.  Consent was obtained for treatment procedures. Debridement of callus right foot with # 15 blade.   Padding dispensed and applied to diabetic insole.    Return office visit  6 weeks                   Told patient to return for periodic foot care and evaluation due to potential at risk complications.   Gardiner Barefoot DPM

## 2019-10-03 ENCOUNTER — Encounter: Payer: Self-pay | Admitting: Podiatry

## 2019-10-03 ENCOUNTER — Ambulatory Visit: Payer: Medicare Other | Admitting: Podiatry

## 2019-10-03 ENCOUNTER — Other Ambulatory Visit: Payer: Self-pay

## 2019-10-03 DIAGNOSIS — M216X1 Other acquired deformities of right foot: Secondary | ICD-10-CM

## 2019-10-03 DIAGNOSIS — L97511 Non-pressure chronic ulcer of other part of right foot limited to breakdown of skin: Secondary | ICD-10-CM | POA: Diagnosis not present

## 2019-10-03 DIAGNOSIS — G629 Polyneuropathy, unspecified: Secondary | ICD-10-CM | POA: Diagnosis not present

## 2019-10-03 DIAGNOSIS — L84 Corns and callosities: Secondary | ICD-10-CM

## 2019-10-03 NOTE — Progress Notes (Signed)
This patient returns to my office for at risk foot care.  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   This patient is unable to treat the ulcer  since the patient cannot reach his ulcer.No pain at site of ulcer right foot.  No drainage noted.  His pain is less wearing his work shoes with padding. This patient presents for at risk foot care today.  General Appearance  Alert, conversant and in no acute stress.  Vascular  Dorsalis pedis and posterior tibial  pulses are minimally  palpable  bilaterally.  Capillary return is within normal limits  bilaterally. Temperature is within normal limits  bilaterally.  Neurologic  Senn-Weinstein monofilament wire test absent right foot.  Diminished LOPS left foot.. Muscle power within normal limits bilaterally.  Nails Normal nails noted both feet. No evidence of bacterial infection or drainage bilaterally.  Orthopedic  No limitations of motion  feet .  No crepitus or effusions noted.  No bony pathology or digital deformities noted. Plantar flexed fifth metatarsal right foot.  Skin  normotropic skin with no porokeratosis noted bilaterally.  No signs of infections or ulcers noted.   Hemorrhagic callus sub 5th met right foot.  [place] 25 mm. X 20 mm.  Ulcer in upper right hand side about 4 mm. X 4 mm.  Pre-ulcerous callus right foot.  Consent was obtained for treatment procedures. Debridement of callus right foot with # 15 blade.   Padding dispensed and applied to diabetic insole.    Return office visit  5 weeks                   Told patient to return for periodic foot care and evaluation due to potential at risk complications.   Gardiner Barefoot DPM

## 2019-11-07 ENCOUNTER — Ambulatory Visit: Payer: Medicare Other | Admitting: Podiatry

## 2019-11-07 ENCOUNTER — Other Ambulatory Visit: Payer: Self-pay

## 2019-11-07 ENCOUNTER — Encounter: Payer: Self-pay | Admitting: Podiatry

## 2019-11-07 DIAGNOSIS — G629 Polyneuropathy, unspecified: Secondary | ICD-10-CM

## 2019-11-07 DIAGNOSIS — T148XXA Other injury of unspecified body region, initial encounter: Secondary | ICD-10-CM | POA: Insufficient documentation

## 2019-11-07 DIAGNOSIS — M216X1 Other acquired deformities of right foot: Secondary | ICD-10-CM

## 2019-11-07 DIAGNOSIS — L84 Corns and callosities: Secondary | ICD-10-CM

## 2019-11-07 NOTE — Progress Notes (Signed)
This patient returns to my office for at risk foot care.  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   This patient is unable to treat the ulcer  since the patient cannot reach his ulcer.No pain or drainage. at site of ulcer right foot. This patient presents for at risk foot care today. Upon exam of his right foot he has had a blister which is healing on the inside right heel. Patient was unaware of the blister until he removed his work shoe.  General Appearance  Alert, conversant and in no acute stress.  Vascular  Dorsalis pedis and posterior tibial  pulses are minimally  palpable  bilaterally.  Capillary return is within normal limits  bilaterally. Temperature is within normal limits  bilaterally.  Neurologic  Senn-Weinstein monofilament wire test absent right foot.  Diminished LOPS left foot.. Muscle power within normal limits bilaterally.  Nails Normal nails noted both feet. No evidence of bacterial infection or drainage bilaterally.  Orthopedic  No limitations of motion  feet .  No crepitus or effusions noted.  No bony pathology or digital deformities noted. Plantar flexed fifth metatarsal right foot.  Skin  normotropic skin with no porokeratosis noted bilaterally.  No signs of infections .     Hemorrhagic callus sub 5th met right foot is starting to break down.  Subcutaneous tissue noted under the callus sub 5th with no signs of drainage or infection.  Blister healing on the medial aspect right heel.  Necrotic tissue noted around and encircling 30. Mm. X 40 mm. Healing blister.    Pre-ulcerous callus right foot. Blister right heel.  Consent was obtained for treatment procedures. Debridement of callus right foot with # 15 blade.   Neosporin//DSD sub 5th met right foot.. Debride necrotic tissue around blister right heel.Bandage blister with  Adaptic/DSD.  Gave home soaking instructions for home care.  Told him to limit  activity to allow the sub 5th ulcer to reheal.   Return office visit  4 weeks                   Told patient to return for periodic foot care and evaluation due to potential at risk complications.   Gardiner Barefoot DPM

## 2019-11-14 ENCOUNTER — Other Ambulatory Visit: Payer: Self-pay | Admitting: Family Medicine

## 2019-11-14 DIAGNOSIS — R066 Hiccough: Secondary | ICD-10-CM

## 2019-11-15 NOTE — Telephone Encounter (Signed)
Requested medication (s) are due for refill today - yes  Requested medication (s) are on the active medication list -yes  Future visit scheduled -yes  Last refill: 07/27/19 #30 2 RF  Notes to clinic: Request for non delegated Rx  Requested Prescriptions  Pending Prescriptions Disp Refills   baclofen (LIORESAL) 10 MG tablet [Pharmacy Med Name: BACLOFEN 10 MG TAB] 30 each 2    Sig: TAKE ONE TABLET TWICE DAILY AS NEEDED FOR MUSCLE SPASM      Not Delegated - Analgesics:  Muscle Relaxants Failed - 11/14/2019 12:03 PM      Failed - This refill cannot be delegated      Failed - Valid encounter within last 6 months    Recent Outpatient Visits           11 months ago Annual physical exam   [facility] Olin Hauser, DO   1 year ago Essential hypertension   [place], Devonne Doughty, DO   2 years ago Annual physical exam   [facility] Olin Hauser, DO   2 years ago Essential hypertension   [place], Devonne Doughty, DO   2 years ago Essential hypertension   [place], DO       Future Appointments             In 3 weeks [place], Devonne Doughty, [facility], [facility]                Requested Prescriptions  Pending Prescriptions Disp Refills   baclofen (LIORESAL) 10 MG tablet [Pharmacy Med Name: BACLOFEN 10 MG TAB] 30 each 2    Sig: TAKE ONE TABLET TWICE DAILY AS NEEDED FOR MUSCLE SPASM      Not Delegated - Analgesics:  Muscle Relaxants Failed - 11/14/2019 12:03 PM      Failed - This refill cannot be delegated      Failed - Valid encounter within last 6 months    Recent Outpatient Visits           11 months ago Annual physical exam   [facility] Olin Hauser, DO   1 year ago Essential hypertension   [place], Devonne Doughty, DO   2 years ago Annual  physical exam   [facility] Olin Hauser, DO   2 years ago Essential hypertension   [place], Devonne Doughty, DO   2 years ago Essential hypertension   [place], DO       Future Appointments             In 3 weeks [place], Devonne Doughty, DO [facility], [facility]

## 2019-12-08 ENCOUNTER — Encounter: Payer: Self-pay | Admitting: Podiatry

## 2019-12-08 ENCOUNTER — Ambulatory Visit (INDEPENDENT_AMBULATORY_CARE_PROVIDER_SITE_OTHER): Payer: Medicare Other | Admitting: Podiatry

## 2019-12-08 ENCOUNTER — Other Ambulatory Visit: Payer: Self-pay

## 2019-12-08 DIAGNOSIS — L84 Corns and callosities: Secondary | ICD-10-CM | POA: Diagnosis not present

## 2019-12-08 DIAGNOSIS — G629 Polyneuropathy, unspecified: Secondary | ICD-10-CM

## 2019-12-08 DIAGNOSIS — M216X1 Other acquired deformities of right foot: Secondary | ICD-10-CM

## 2019-12-08 DIAGNOSIS — T148XXA Other injury of unspecified body region, initial encounter: Secondary | ICD-10-CM

## 2019-12-08 NOTE — Progress Notes (Signed)
This patient returns to my office for at risk foot care.  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   This patient is unable to treat the ulcer  since the patient cannot reach his ulcer.No pain or drainage. at site of ulcer right foot. This patient presents for at risk foot care today. Upon exam of his right foot he has had a blister which is healing on the inside right heel. Patient was unaware of the blister until he removed his work shoe.  General Appearance  Alert, conversant and in no acute stress.  Vascular  Dorsalis pedis and posterior tibial  pulses are minimally  palpable  bilaterally.  Capillary return is within normal limits  bilaterally. Temperature is within normal limits  bilaterally.  Neurologic  Senn-Weinstein monofilament wire test absent right foot.  Diminished LOPS left foot.. Muscle power within normal limits bilaterally.  Nails Normal nails noted both feet. No evidence of bacterial infection or drainage bilaterally.  Orthopedic  No limitations of motion  feet .  No crepitus or effusions noted.  No bony pathology or digital deformities noted. Plantar flexed fifth metatarsal right foot.  Skin  normotropic skin with no porokeratosis noted bilaterally.  No signs of infections .     Hemorrhagic callus sub 5th met right foot with  no signs of drainage or infection.  Blister healing on the medial aspect right heel.  Necrotic tissue noted around healing blister right heel.  Pre-ulcerous callus right foot. Blister right heel.  Consent was obtained for treatment procedures. Debridement of callus right foot with # 15 blade.  Debride necrotic tissue around blister right heel.Bandage blister with  # 15 blade and  dremel tool.   Told him to limit activity to allow the sub 5th ulcer to reheal.   Return office visit  4 weeks                   Told patient to return for periodic foot care and evaluation due to  potential at risk complications.   Gardiner Barefoot DPM

## 2019-12-09 ENCOUNTER — Encounter: Payer: Medicare Other | Admitting: Family Medicine

## 2019-12-20 ENCOUNTER — Other Ambulatory Visit: Payer: Self-pay

## 2019-12-20 ENCOUNTER — Ambulatory Visit (INDEPENDENT_AMBULATORY_CARE_PROVIDER_SITE_OTHER): Payer: Medicare Other | Admitting: Family Medicine

## 2019-12-20 ENCOUNTER — Encounter: Payer: Self-pay | Admitting: Family Medicine

## 2019-12-20 VITALS — BP 174/83 | HR 93 | Temp 97.3°F | Resp 16 | Ht 75.0 in | Wt 203.6 lb

## 2019-12-20 DIAGNOSIS — R600 Localized edema: Secondary | ICD-10-CM

## 2019-12-20 DIAGNOSIS — E663 Overweight: Secondary | ICD-10-CM | POA: Diagnosis not present

## 2019-12-20 DIAGNOSIS — I10 Essential (primary) hypertension: Secondary | ICD-10-CM | POA: Diagnosis not present

## 2019-12-20 DIAGNOSIS — R7989 Other specified abnormal findings of blood chemistry: Secondary | ICD-10-CM

## 2019-12-20 DIAGNOSIS — R7309 Other abnormal glucose: Secondary | ICD-10-CM | POA: Diagnosis not present

## 2019-12-20 DIAGNOSIS — N529 Male erectile dysfunction, unspecified: Secondary | ICD-10-CM

## 2019-12-20 DIAGNOSIS — F1021 Alcohol dependence, in remission: Secondary | ICD-10-CM | POA: Diagnosis not present

## 2019-12-20 DIAGNOSIS — Z125 Encounter for screening for malignant neoplasm of prostate: Secondary | ICD-10-CM

## 2019-12-20 DIAGNOSIS — Z0000 Encounter for general adult medical examination without abnormal findings: Secondary | ICD-10-CM | POA: Diagnosis not present

## 2019-12-20 DIAGNOSIS — R066 Hiccough: Secondary | ICD-10-CM

## 2019-12-20 MED ORDER — BACLOFEN 10 MG PO TABS: 10.0000 mg | ORAL_TABLET | Freq: Two times a day (BID) | ORAL | 5 refills | Status: DC | PRN

## 2019-12-20 MED ORDER — HYDROCHLOROTHIAZIDE 25 MG PO TABS: 25.0000 mg | ORAL_TABLET | Freq: Every day | ORAL | 3 refills | Status: DC

## 2019-12-20 MED ORDER — SILDENAFIL CITRATE 20 MG PO TABS: ORAL_TABLET | ORAL | 3 refills | Status: DC

## 2019-12-20 MED ORDER — LISINOPRIL 20 MG PO TABS: 20.0000 mg | ORAL_TABLET | Freq: Every day | ORAL | 3 refills | Status: DC

## 2019-12-20 NOTE — Patient Instructions (Addendum)
Thank you for coming to the office today.  Labs today  Refilled medicines  Keep track of BP, bring readings next time  We may increase BP med if still elevated.  Trial on Sildenafil  Please schedule a Follow-up Appointment to: Return in about 3 months (around 03/21/2020) for 3 month follow-up HTN.  If you have any other questions or concerns, please feel free to call the office or send a message through [place]. You may also schedule an earlier appointment if necessary.  Additionally, you may be receiving a survey about your experience at our office within a few days to 1 week by e-mail or mail. We value your feedback.  [name], DO [facility]

## 2019-12-20 NOTE — Assessment & Plan Note (Signed)
Followed by GI Now on baclofen PRN with 50% success, may future follow up with GI for possible procedure for hiatal hernia

## 2019-12-20 NOTE — Assessment & Plan Note (Addendum)
Elevated BP today, limited home readings at this time. Prior meds: Failed CCB (Adalat, Amlodipine, swelling), Clonidine (alcohol wd only) - History of hyponatremia on thiazide Complicated by CKD-II - improved  Plan 1. Continue current BP regimen - Lisinopril 20mg , HCTZ 25mg  daily 2. Encourage improved lifestyle - low sodium diet, improve regular exercise 3. Continue monitor BP outside office, bring readings to next visit, if persistently >140/90 or new symptoms notify office sooner  He should check regularly and bring [place] BP log within next 3 month for BP f/u

## 2019-12-20 NOTE — Assessment & Plan Note (Signed)
Stable, remains abstinent from alcohol No withdrawal symptoms Mood is controlled off substance

## 2019-12-20 NOTE — Assessment & Plan Note (Signed)
Resolved Off amlodipine

## 2019-12-20 NOTE — Assessment & Plan Note (Signed)
Likely secondary to HTN On thiazide, ACEi Check labs today

## 2019-12-20 NOTE — Progress Notes (Signed)
Subjective:    Patient ID: John Booker, male    DOB: [birth year], 69 y.o.   MRN: [id number]  John Booker is a 69 y.o. male presenting on 12/20/2019 for Annual Exam   HPI  Here for Annual Physical and fasting today due labs  FOLLOW-UPCHRONIC HTN/ Bilateral Lower Extremity Edema He has no new complaints for BP, he is not checking it lately. -Current Meds -HCTZ 25mg  daily, Lisinopril20mg  daily Lifestyle: - Diet:stable, remains alcohol free, he drinks some water, balanced diet unsure on salt content of foods - Exercise:limited  Elevated Creatinine / Possible CKD-II Prior trend of mild to moderate elevated Cr.  Last lab 11/2018 had Cr 1.34, due today, near his baseline lab Cr 1.2 to 1.4 Due for labs today  CHRONIC HICCUPS / BELCHING GAS Chronic problem followed by [facility] on PPI initially, also trial gabapentin. Now seen by GI in [place] - Taking Baclofen PRN for hiccups, only 50% effective, he was told small hiatal hernia but he prefers not to have surgery, if vomit then his hiccups usually resolve Admits still hiccups daily basis and some belching gas  Alcohol Dependence - in remission Remains sober off alcohol  Erectile Dysfunction No prior trial on medicine. Ready to try Sildenafil PRN   Health Maintenance: Declines COVID19 vaccine Due for Flu Shot, declines today despite counseling on benefits  Due Prevnar-13 declined today despite counseling  Colon CA Screening: Last Colonoscopy>10 years ago done by outside GI, reported to have been done without sedation and he did not tolerate it well, he declines repeat colonoscopy at this time, also declines cologuard or other fecal testing non invasive, he would consider colonoscopy with sedation and is already patient of [facility], will reconsider with them when future return for EGD. No known fam history of colon CA  Depression screen [facility] 2/9 12/20/2019 11/30/2018 11/30/2018  Decreased Interest 0 0 0  Down, Depressed,  Hopeless 0 0 0  PHQ - 2 Score 0 0 0  Altered sleeping - 0 -  Tired, decreased energy - 0 -  Change in appetite - 0 -  Feeling bad or failure about yourself  - 0 -  Trouble concentrating - 0 -  Moving slowly or fidgety/restless - 0 -  Suicidal thoughts - 0 -  PHQ-9 Score - 0 -  Difficult doing work/chores - - -    Past Medical History:  Diagnosis Date  . Hypertension    Past Surgical History:  Procedure Laterality Date  . ESOPHAGOGASTRODUODENOSCOPY (EGD) WITH PROPOFOL N/A 05/26/2018   Procedure: ESOPHAGOGASTRODUODENOSCOPY (EGD) WITH PROPOFOL;  Surgeon: Virgel Manifold, MD;  Location: [facility] ENDOSCOPY;  Service: Endoscopy;  Laterality: N/A;  . INCISION AND DRAINAGE PERIRECTAL ABSCESS N/A 11/19/2015   Procedure: IRRIGATION AND DEBRIDEMENT PERIRECTAL ABSCESS and debridement of scrotal abscess;  Surgeon: Florene Glen, MD;  Location: [facility] ORS;  Service: General;  Laterality: N/A;  . INCISION AND DRAINAGE PERIRECTAL ABSCESS N/A 11/20/2015   Procedure: IRRIGATION AND DEBRIDEMENT PERIRECTAL ABSCESS / WITH DRESSING CHANGE;  Surgeon: Florene Glen, MD;  Location: [facility] ORS;  Service: General;  Laterality: N/A;  peri rectal site  . INCISION AND DRAINAGE PERIRECTAL ABSCESS N/A 11/21/2015   Procedure: IRRIGATION AND DEBRIDEMENT PERIRECTAL ABSCESS WITH DRESSING CHANGE;  Surgeon: Florene Glen, MD;  Location: [facility] ORS;  Service: General;  Laterality: N/A;  . [place]   Social History   Socioeconomic History  . Marital status: Legally Separated    Spouse name: Not on file  .  Number of children: Not on file  . Years of education: Not on file  . Highest education level: Not on file  Occupational History  . Not on file  Tobacco Use  . Smoking status: Former Smoker    Packs/day: 0.50    Years: 38.00    Pack years: 19.00    Types: Cigarettes  . Smokeless tobacco: Former Network engineer  . Vaping Use: Never used  Substance and Sexual Activity  . Alcohol use: No    . Drug use: No  . Sexual activity: Not on file  Other Topics Concern  . Not on file  Social History Narrative  . Not on file   Social Determinants of Health   Financial Resource Strain:   . Difficulty of Paying Living Expenses: Not on file  Food Insecurity:   . Worried About Charity fundraiser in the Last Year: Not on file  . Ran Out of Food in the Last Year: Not on file  Transportation Needs:   . Lack of Transportation (Medical): Not on file  . Lack of Transportation (Non-Medical): Not on file  Physical Activity:   . Days of Exercise per Week: Not on file  . Minutes of Exercise per Session: Not on file  Stress:   . Feeling of Stress : Not on file  Social Connections:   . Frequency of Communication with Friends and Family: Not on file  . Frequency of Social Gatherings with Friends and Family: Not on file  . Attends Religious Services: Not on file  . Active Member of Clubs or Organizations: Not on file  . Attends Archivist Meetings: Not on file  . Marital Status: Not on file  Intimate Partner Violence:   . Fear of Current or Ex-Partner: Not on file  . Emotionally Abused: Not on file  . Physically Abused: Not on file  . Sexually Abused: Not on file   Family History  Problem Relation Age of Onset  . Hypertension Father    No current outpatient medications on file prior to visit.   No current facility-administered medications on file prior to visit.    Review of Systems  Constitutional: Negative for activity change, appetite change, chills, diaphoresis, fatigue and fever.  HENT: Negative for congestion and hearing loss.   Eyes: Negative for visual disturbance.  Respiratory: Negative for cough, chest tightness, shortness of breath and wheezing.   Cardiovascular: Negative for chest pain, palpitations and leg swelling.  Gastrointestinal: Negative for abdominal pain, anal bleeding, blood in stool, constipation, diarrhea, nausea and vomiting.       Hiccups    Endocrine: Negative for cold intolerance.  Genitourinary: Negative for difficulty urinating, dysuria, frequency and hematuria.  Musculoskeletal: Negative for arthralgias and neck pain.  Skin: Negative for rash.  Allergic/Immunologic: Negative for environmental allergies.  Neurological: Negative for dizziness, weakness, light-headedness, numbness and headaches.  Hematological: Negative for adenopathy.  Psychiatric/Behavioral: Negative for behavioral problems, dysphoric mood and sleep disturbance.   Per HPI unless specifically indicated above      Objective:    BP (!) 174/83   Pulse 93   Temp (!) 97.3 F (36.3 C) (Temporal)   Resp 16   Ht 6\' 3"  (1.905 m)   Wt 203 lb 9.6 oz (92.4 kg)   SpO2 100%   BMI 25.45 kg/m   Wt Readings from Last 3 Encounters:  12/20/19 203 lb 9.6 oz (92.4 kg)  11/30/18 203 lb 6.4 oz (92.3 kg)  05/26/18 220 lb (  99.8 kg)    Physical Exam Vitals and nursing note reviewed.  Constitutional:      General: He is not in acute distress.    Appearance: He is well-developed. He is not diaphoretic.     Comments: Well-appearing, comfortable, cooperative  HENT:     Head: Normocephalic and atraumatic.  Eyes:     General:        Right eye: No discharge.        Left eye: No discharge.     Conjunctiva/sclera: Conjunctivae normal.     Pupils: Pupils are equal, round, and reactive to light.  Neck:     Thyroid: No thyromegaly.     Comments: No carotid bruits Cardiovascular:     Rate and Rhythm: Normal rate and regular rhythm.     Heart sounds: Normal heart sounds. No murmur heard.   Pulmonary:     Effort: Pulmonary effort is normal. No respiratory distress.     Breath sounds: Normal breath sounds. No wheezing or rales.  Abdominal:     General: Bowel sounds are normal. There is no distension.     Palpations: Abdomen is soft. There is no mass.     Tenderness: There is no abdominal tenderness.     Comments: Active hiccups  Musculoskeletal:        General: No  tenderness. Normal range of motion.     Cervical back: Normal range of motion and neck supple.     Comments: Upper / Lower Extremities: - Normal muscle tone, strength bilateral upper extremities 5/5, lower extremities 5/5  Lymphadenopathy:     Cervical: No cervical adenopathy.  Skin:    General: Skin is warm and dry.     Findings: No erythema or rash.  Neurological:     Mental Status: He is alert and oriented to person, place, and time.     Comments: Distal sensation intact to light touch all extremities  Psychiatric:        Behavior: Behavior normal.     Comments: Well groomed, good eye contact, normal speech and thoughts    Results for orders placed or performed in visit on 11/30/18  PSA  Result Value Ref Range   PSA 1.5 < OR = 4.0 ng/mL  Lipid panel  Result Value Ref Range   Cholesterol 132 <200 mg/dL   HDL 74 > OR = 40 mg/dL   Triglycerides 44 <150 mg/dL   LDL Cholesterol (Calc) 45 mg/dL (calc)   Total CHOL/HDL Ratio 1.8 <5.0 (calc)   Non-HDL Cholesterol (Calc) 58 <130 mg/dL (calc)  COMPLETE METABOLIC PANEL WITH GFR  Result Value Ref Range   Glucose, Bld 109 (H) 65 - 99 mg/dL   BUN 34 (H) 7 - 25 mg/dL   Creat 1.34 (H) 0.70 - 1.25 mg/dL   GFR, Est Non African American 54 (L) > OR = 60 mL/min/1.63m2   GFR, Est African American 62 > OR = 60 mL/min/1.82m2   BUN/Creatinine Ratio 25 (H) 6 - 22 (calc)   Sodium 138 135 - 146 mmol/L   Potassium 5.2 3.5 - 5.3 mmol/L   Chloride 104 98 - 110 mmol/L   CO2 27 20 - 32 mmol/L   Calcium 9.6 8.6 - 10.3 mg/dL   Total Protein 6.4 6.1 - 8.1 g/dL   Albumin 4.1 3.6 - 5.1 g/dL   Globulin 2.3 1.9 - 3.7 g/dL (calc)   AG Ratio 1.8 1.0 - 2.5 (calc)   Total Bilirubin 0.7 0.2 - 1.2 mg/dL  Alkaline phosphatase (APISO) 71 35 - 144 U/L   AST 19 10 - 35 U/L   ALT 19 9 - 46 U/L  CBC with Differential/Platelet  Result Value Ref Range   WBC 4.6 3.8 - 10.8 Thousand/uL   RBC 4.29 4.20 - 5.80 Million/uL   Hemoglobin 13.6 13.2 - 17.1 g/dL   HCT  41.4 38 - 50 %   MCV 96.5 80.0 - 100.0 fL   MCH 31.7 27.0 - 33.0 pg   MCHC 32.9 32.0 - 36.0 g/dL   RDW 12.3 11.0 - 15.0 %   Platelets 202 140 - 400 Thousand/uL   MPV 11.2 7.5 - 12.5 fL   Neutro Abs 3,312 1,500 - 7,800 cells/uL   Lymphs Abs 777 (L) 850 - 3,900 cells/uL   Absolute Monocytes 437 200 - 950 cells/uL   Eosinophils Absolute 41 15 - 500 cells/uL   Basophils Absolute 32 0 - 200 cells/uL   Neutrophils Relative % 72 %   Total Lymphocyte 16.9 %   Monocytes Relative 9.5 %   Eosinophils Relative 0.9 %   Basophils Relative 0.7 %  Hemoglobin A1c  Result Value Ref Range   Hgb A1c MFr Bld 5.4 <5.7 % of total Hgb   Mean Plasma Glucose 108 (calc)   eAG (mmol/L) 6.0 (calc)      Assessment & Plan:   Problem List Items Addressed This Visit    Overweight (BMI 25.0-29.9)   Relevant Orders   COMPLETE METABOLIC PANEL WITH GFR   Intractable hiccups    Followed by GI Now on baclofen PRN with 50% success, may future follow up with GI for possible procedure for hiatal hernia      Relevant Medications   baclofen (LIORESAL) 10 MG tablet   Essential hypertension    Elevated BP today, limited home readings at this time. Prior meds: Failed CCB (Adalat, Amlodipine, swelling), Clonidine (alcohol wd only) - History of hyponatremia on thiazide Complicated by CKD-II - improved  Plan 1. Continue current BP regimen - Lisinopril 20mg , HCTZ 25mg  daily 2. Encourage improved lifestyle - low sodium diet, improve regular exercise 3. Continue monitor BP outside office, bring readings to next visit, if persistently >140/90 or new symptoms notify office sooner  He should check regularly and bring [place] BP log within next 3 month for BP f/u      Relevant Medications   hydrochlorothiazide (HYDRODIURIL) 25 MG tablet   lisinopril (ZESTRIL) 20 MG tablet   sildenafil (REVATIO) 20 MG tablet   Other Relevant Orders   CBC with Differential/Platelet   COMPLETE METABOLIC PANEL WITH GFR   Lipid panel    Elevated serum creatinine    Likely secondary to HTN On thiazide, ACEi Check labs today      Relevant Orders   COMPLETE METABOLIC PANEL WITH GFR   Bilateral lower extremity edema    Resolved Off amlodipine      Relevant Medications   hydrochlorothiazide (HYDRODIURIL) 25 MG tablet   Alcohol dependence in sustained full remission (HCC)    Stable, remains abstinent from alcohol No withdrawal symptoms Mood is controlled off substance      Relevant Orders   COMPLETE METABOLIC PANEL WITH GFR   Abnormal glucose   Relevant Orders   Hemoglobin A1c    Other Visit Diagnoses    Annual physical exam    -  Primary   Relevant Orders   Hemoglobin A1c   CBC with Differential/Platelet   COMPLETE METABOLIC PANEL WITH GFR   Prostate cancer  screening       Relevant Orders   PSA   Erectile dysfunction, unspecified erectile dysfunction type       Relevant Medications   sildenafil (REVATIO) 20 MG tablet     Updated Health Maintenance information - Declines colon ca screening and vaccines today flu, covid, pneumonia (prevnar) Reviewed recent lab results with patient Encouraged improvement to lifestyle with diet and exercise - Goal of weight loss   Meds ordered this encounter  Medications  . baclofen (LIORESAL) 10 MG tablet    Sig: Take 1 tablet (10 mg total) by mouth 2 (two) times daily as needed for muscle spasms.    Dispense:  30 each    Refill:  5  . hydrochlorothiazide (HYDRODIURIL) 25 MG tablet    Sig: Take 1 tablet (25 mg total) by mouth daily.    Dispense:  90 tablet    Refill:  3  . lisinopril (ZESTRIL) 20 MG tablet    Sig: Take 1 tablet (20 mg total) by mouth daily.    Dispense:  90 tablet    Refill:  3  . sildenafil (REVATIO) 20 MG tablet    Sig: Take 1-5 pills about 30 min prior to sex. Start with 1 and increase as needed.    Dispense:  30 tablet    Refill:  3      Follow up plan: Return in about 3 months (around 03/21/2020) for 3 month follow-up  HTN.  [name], DO [facility] 12/20/2019, 8:59 AM

## 2019-12-21 LAB — COMPLETE METABOLIC PANEL WITH GFR
AG Ratio: 1.8 (calc) (ref 1.0–2.5)
ALT: 10 U/L (ref 9–46)
AST: 16 U/L (ref 10–35)
Albumin: 4.4 g/dL (ref 3.6–5.1)
Alkaline phosphatase (APISO): 55 U/L (ref 35–144)
BUN: 24 mg/dL (ref 7–25)
CO2: 32 mmol/L (ref 20–32)
Calcium: 9.5 mg/dL (ref 8.6–10.3)
Chloride: 98 mmol/L (ref 98–110)
Creat: 1.03 mg/dL (ref 0.70–1.18)
GFR, Est African American: 85 mL/min/{1.73_m2} (ref >=60)
GFR, Est Non African American: 73 mL/min/{1.73_m2} (ref >=60)
Globulin: 2.5 g/dL (calc) (ref 1.9–3.7)
Glucose, Bld: 118 mg/dL — ABNORMAL HIGH (ref 65–99)
Potassium: 4.4 mmol/L (ref 3.5–5.3)
Sodium: 138 mmol/L (ref 135–146)
Total Bilirubin: 1 mg/dL (ref 0.2–1.2)
Total Protein: 6.9 g/dL (ref 6.1–8.1)

## 2019-12-21 LAB — HEMOGLOBIN A1C
Hgb A1c MFr Bld: 5.4 % of total Hgb (ref <5.7)
Mean Plasma Glucose: 108 (calc)
eAG (mmol/L): 6 (calc)

## 2019-12-21 LAB — CBC WITH DIFFERENTIAL/PLATELET
Absolute Monocytes: 495 cells/uL (ref 200–950)
Basophils Absolute: 39 cells/uL (ref 0–200)
Basophils Relative: 0.7 %
Eosinophils Absolute: 143 cells/uL (ref 15–500)
Eosinophils Relative: 2.6 %
HCT: 46.1 % (ref 38.5–50.0)
Hemoglobin: 15.6 g/dL (ref 13.2–17.1)
Lymphs Abs: 809 cells/uL — ABNORMAL LOW (ref 850–3900)
MCH: 32.2 pg (ref 27.0–33.0)
MCHC: 33.8 g/dL (ref 32.0–36.0)
MCV: 95.2 fL (ref 80.0–100.0)
MPV: 10.5 fL (ref 7.5–12.5)
Monocytes Relative: 9 %
Neutro Abs: 4015 cells/uL (ref 1500–7800)
Neutrophils Relative %: 73 %
Platelets: 207 10*3/uL (ref 140–400)
RBC: 4.84 10*6/uL (ref 4.20–5.80)
RDW: 11.8 % (ref 11.0–15.0)
Total Lymphocyte: 14.7 %
WBC: 5.5 10*3/uL (ref 3.8–10.8)

## 2019-12-21 LAB — LIPID PANEL
Cholesterol: 184 mg/dL (ref <200)
HDL: 92 mg/dL (ref >=40)
LDL Cholesterol (Calc): 79 mg/dL (calc)
Non-HDL Cholesterol (Calc): 92 mg/dL (calc) (ref <130)
Total CHOL/HDL Ratio: 2 (calc) (ref <5.0)
Triglycerides: 45 mg/dL (ref <150)

## 2019-12-21 LAB — PSA: PSA: 1.02 ng/mL (ref <=4.0)

## 2019-12-23 ENCOUNTER — Telehealth: Payer: Self-pay | Admitting: Family Medicine

## 2019-12-23 NOTE — Telephone Encounter (Signed)
Pt. Given results, verbalizes understanding. 

## 2019-12-23 NOTE — Telephone Encounter (Signed)
Patient is calling back for lab results. Please advise [phone]- [phone]

## 2019-12-23 NOTE — Telephone Encounter (Signed)
Phone call to pt.  Left vm to return call to discuss lab results.

## 2020-01-09 ENCOUNTER — Encounter: Payer: Self-pay | Admitting: Podiatry

## 2020-01-09 ENCOUNTER — Ambulatory Visit: Payer: Medicare Other | Admitting: Podiatry

## 2020-01-09 ENCOUNTER — Other Ambulatory Visit: Payer: Self-pay

## 2020-01-09 DIAGNOSIS — G629 Polyneuropathy, unspecified: Secondary | ICD-10-CM | POA: Diagnosis not present

## 2020-01-09 DIAGNOSIS — T148XXA Other injury of unspecified body region, initial encounter: Secondary | ICD-10-CM | POA: Diagnosis not present

## 2020-01-09 DIAGNOSIS — L84 Corns and callosities: Secondary | ICD-10-CM

## 2020-01-09 DIAGNOSIS — M216X1 Other acquired deformities of right foot: Secondary | ICD-10-CM

## 2020-01-09 NOTE — Progress Notes (Signed)
This patient returns to my office for at risk foot care.  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   This patient is unable to treat the ulcer  since the patient cannot reach his ulcer.No pain or drainage. at site of ulcer right foot. This patient presents for at risk foot care today. Upon exam of his right foot he has had a blister which is healing on the inside right heel.   General Appearance  Alert, conversant and in no acute stress.  Vascular  Dorsalis pedis and posterior tibial  pulses are minimally  palpable  bilaterally.  Capillary return is within normal limits  bilaterally. Temperature is within normal limits  bilaterally.  Neurologic  Senn-Weinstein monofilament wire test absent right foot.  Diminished LOPS left foot.. Muscle power within normal limits bilaterally.  Nails Normal nails noted both feet. No evidence of bacterial infection or drainage bilaterally.  Orthopedic  No limitations of motion  feet .  No crepitus or effusions noted.  No bony pathology or digital deformities noted. Plantar flexed fifth metatarsal right foot.  Skin  normotropic skin with no porokeratosis noted bilaterally.  No signs of infections .     Hemorrhagic callus sub 5th met right foot with  no signs of drainage or infection.  Blister healing on the medial aspect right heel.    Pre-ulcerous callus right foot. Blister right heel.  Consent was obtained for treatment procedures. Debridement of callus right foot with # 15 blade. There is small fissure noted distal aspect callus sub 5th right foot.  Debride necrotic tissue around blister right heel.   Told him to limit activity to allow the sub 5th ulcer to reheal.   Return office visit  4 weeks                   Told patient to return for periodic foot care and evaluation due to potential at risk complications.   Gardiner Barefoot DPM

## 2020-02-06 ENCOUNTER — Ambulatory Visit: Payer: Medicare Other | Admitting: Podiatry

## 2020-03-21 ENCOUNTER — Encounter: Payer: Self-pay | Admitting: Family Medicine

## 2020-03-21 ENCOUNTER — Other Ambulatory Visit: Payer: Self-pay

## 2020-03-21 ENCOUNTER — Ambulatory Visit (INDEPENDENT_AMBULATORY_CARE_PROVIDER_SITE_OTHER): Payer: Medicare Other | Admitting: Family Medicine

## 2020-03-21 DIAGNOSIS — Z2821 Immunization not carried out because of patient refusal: Secondary | ICD-10-CM | POA: Diagnosis not present

## 2020-03-21 DIAGNOSIS — F1021 Alcohol dependence, in remission: Secondary | ICD-10-CM | POA: Diagnosis not present

## 2020-03-21 DIAGNOSIS — R066 Hiccough: Secondary | ICD-10-CM

## 2020-03-21 DIAGNOSIS — I10 Essential (primary) hypertension: Secondary | ICD-10-CM | POA: Diagnosis not present

## 2020-03-21 MED ORDER — BACLOFEN 10 MG PO TABS: 10.0000 mg | ORAL_TABLET | Freq: Two times a day (BID) | ORAL | 3 refills | Status: DC

## 2020-03-21 NOTE — Progress Notes (Signed)
Subjective:    Patient ID: John Booker, male    DOB: [birth year], 70 y.o.   MRN: [id number]  John Booker is a 70 y.o. male presenting on 03/21/2020 for Hypertension   HPI   FOLLOW-UPCHRONIC HTN/ Bilateral Lower Extremity Edema CKD-II Previous history has had elevated BP difficult to control, was on variety of other meds, previously doing well on Lisinopril 40mg , however this was lowered in past. Home BP readings avg 150/85 -Current Meds -HCTZ 25mg  daily, Lisinopril20mg  daily Lifestyle: - Diet:stable, remains alcohol free, he drinks some water, balanced diet unsure on salt content of foods - Exercise:limited Prior lab 1.34 down to 1 recently 12/2019  CHRONIC HICCUPS / BELCHING GAS Chronic problem followed by [facility] on PPI initially, also trial gabapentin. Now seen by GI in [place] -Taking Baclofen PRN for hiccups, only 50% effective, he was told small hiatal hernia but he prefers not to have surgery, if vomit then his hiccups usually resolve He needs refill on twice a day baclofen Admits still hiccups daily basis and some belching gas   PMH Alcohol Dependence - in full remission.  Health Maintenance: Due Prevnar13, declines today Declines COVID vaccine  Depression screen [facility] 2/9 12/20/2019 11/30/2018 11/30/2018  Decreased Interest 0 0 0  Down, Depressed, Hopeless 0 0 0  PHQ - 2 Score 0 0 0  Altered sleeping - 0 -  Tired, decreased energy - 0 -  Change in appetite - 0 -  Feeling bad or failure about yourself  - 0 -  Trouble concentrating - 0 -  Moving slowly or fidgety/restless - 0 -  Suicidal thoughts - 0 -  PHQ-9 Score - 0 -  Difficult doing work/chores - - -    Social History   Tobacco Use  . Smoking status: Former Smoker    Packs/day: 0.50    Years: 38.00    Pack years: 19.00    Types: Cigarettes  . Smokeless tobacco: Former Network engineer  . Vaping Use: Never used  Substance Use Topics  . Alcohol use: No  . Drug use: No    Review of  Systems Per HPI unless specifically indicated above     Objective:    BP (!) 169/76   Pulse 92   Ht 6\' 3"  (1.905 m)   Wt 195 lb 3.2 oz (88.5 kg)   SpO2 100%   BMI 24.40 kg/m   Wt Readings from Last 3 Encounters:  03/21/20 195 lb 3.2 oz (88.5 kg)  12/20/19 203 lb 9.6 oz (92.4 kg)  11/30/18 203 lb 6.4 oz (92.3 kg)    Physical Exam Vitals and nursing note reviewed.  Constitutional:      General: He is not in acute distress.    Appearance: He is well-developed and well-nourished. He is not diaphoretic.     Comments: Well-appearing, comfortable, cooperative  HENT:     Head: Normocephalic and atraumatic.     Mouth/Throat:     Mouth: Oropharynx is clear and moist.  Eyes:     General:        Right eye: No discharge.        Left eye: No discharge.     Conjunctiva/sclera: Conjunctivae normal.  Neck:     Thyroid: No thyromegaly.  Cardiovascular:     Rate and Rhythm: Normal rate and regular rhythm.     Pulses: Intact distal pulses.     Heart sounds: Normal heart sounds. No murmur heard.   Pulmonary:  Effort: Pulmonary effort is normal. No respiratory distress.     Breath sounds: Normal breath sounds. No wheezing or rales.  Musculoskeletal:        General: No edema. Normal range of motion.     Cervical back: Normal range of motion and neck supple.  Lymphadenopathy:     Cervical: No cervical adenopathy.  Skin:    General: Skin is warm and dry.     Findings: No erythema or rash.  Neurological:     Mental Status: He is alert and oriented to person, place, and time.  Psychiatric:        Mood and Affect: Mood and affect normal.        Behavior: Behavior normal.     Comments: Well groomed, good eye contact, normal speech and thoughts       Results for orders placed or performed in visit on 12/20/19  Hemoglobin A1c  Result Value Ref Range   Hgb A1c MFr Bld 5.4 <5.7 % of total Hgb   Mean Plasma Glucose 108 (calc)   eAG (mmol/L) 6.0 (calc)  CBC with  Differential/Platelet  Result Value Ref Range   WBC 5.5 3.8 - 10.8 Thousand/uL   RBC 4.84 4.20 - 5.80 Million/uL   Hemoglobin 15.6 13.2 - 17.1 g/dL   HCT 46.1 38.5 - 50.0 %   MCV 95.2 80.0 - 100.0 fL   MCH 32.2 27.0 - 33.0 pg   MCHC 33.8 32.0 - 36.0 g/dL   RDW 11.8 11.0 - 15.0 %   Platelets 207 140 - 400 Thousand/uL   MPV 10.5 7.5 - 12.5 fL   Neutro Abs 4,015 1,500 - 7,800 cells/uL   Lymphs Abs 809 (L) 850 - 3,900 cells/uL   Absolute Monocytes 495 200 - 950 cells/uL   Eosinophils Absolute 143 15 - 500 cells/uL   Basophils Absolute 39 0 - 200 cells/uL   Neutrophils Relative % 73 %   Total Lymphocyte 14.7 %   Monocytes Relative 9.0 %   Eosinophils Relative 2.6 %   Basophils Relative 0.7 %  COMPLETE METABOLIC PANEL WITH GFR  Result Value Ref Range   Glucose, Bld 118 (H) 65 - 99 mg/dL   BUN 24 7 - 25 mg/dL   Creat 1.03 0.70 - 1.18 mg/dL   GFR, Est Non African American 73 > OR = 60 mL/min/1.28m2   GFR, Est African American 85 > OR = 60 mL/min/1.58m2   BUN/Creatinine Ratio NOT APPLICABLE 6 - 22 (calc)   Sodium 138 135 - 146 mmol/L   Potassium 4.4 3.5 - 5.3 mmol/L   Chloride 98 98 - 110 mmol/L   CO2 32 20 - 32 mmol/L   Calcium 9.5 8.6 - 10.3 mg/dL   Total Protein 6.9 6.1 - 8.1 g/dL   Albumin 4.4 3.6 - 5.1 g/dL   Globulin 2.5 1.9 - 3.7 g/dL (calc)   AG Ratio 1.8 1.0 - 2.5 (calc)   Total Bilirubin 1.0 0.2 - 1.2 mg/dL   Alkaline phosphatase (APISO) 55 35 - 144 U/L   AST 16 10 - 35 U/L   ALT 10 9 - 46 U/L  Lipid panel  Result Value Ref Range   Cholesterol 184 <200 mg/dL   HDL 92 > OR = 40 mg/dL   Triglycerides 45 <150 mg/dL   LDL Cholesterol (Calc) 79 mg/dL (calc)   Total CHOL/HDL Ratio 2.0 <5.0 (calc)   Non-HDL Cholesterol (Calc) 92 <130 mg/dL (calc)  PSA  Result Value Ref Range   PSA  1.02 < OR = 4.0 ng/mL      Assessment & Plan:   Problem List Items Addressed This Visit    Intractable hiccups    Followed by GI Re order Baclofen BID dosing now, 90 day rx May future  follow up with GI for possible procedure for hiatal hernia      Relevant Medications   baclofen (LIORESAL) 10 MG tablet   Essential hypertension    Elevated BP today Home readings reviewed still elevated Prior meds: Failed CCB (Adalat, Amlodipine, swelling), Clonidine (alcohol wd only) - History of hyponatremia on thiazide Complicated by CKD-II - improved  Plan 1. INCREASE Lisinopril from 20 to 40mg  daily - use existing 20mg  tabs x 2 per dose = 40mg , he has 1-2 weeks left, he will call us with BP readings if improved BP, we can order 40mg , if still not improved BP we may change med 2. Continue HCTZ 25mg  daily 3. Encourage improved lifestyle - low sodium diet, improve regular exercise 4. Continue monitor BP outside office, bring readings to next visit, if persistently >140/90 or new symptoms notify office sooner  Future consider swap lisinopril with Losartan or other ARB OR - chlorthalidone option instead of HCTZ, or other med      Relevant Medications   lisinopril (ZESTRIL) 40 MG tablet   COVID-19 vaccination declined   Alcohol dependence in sustained full remission ([place])    Stable, remains abstinent from alcohol No withdrawal symptoms Mood is controlled off substance         Meds ordered this encounter  Medications  . baclofen (LIORESAL) 10 MG tablet    Sig: Take 1 tablet (10 mg total) by mouth 2 (two) times daily.    Dispense:  180 each    Refill:  3      Follow up plan: Return in about 3 months (around 06/19/2020) for 3 month HTN.   [name], [facility] 03/21/2020, 8:35 AM

## 2020-03-21 NOTE — Assessment & Plan Note (Addendum)
Elevated BP today Home readings reviewed still elevated Prior meds: Failed CCB (Adalat, Amlodipine, swelling), Clonidine (alcohol wd only) - History of hyponatremia on thiazide Complicated by CKD-II - improved  Plan 1. INCREASE Lisinopril from 20 to 40mg  daily - use existing 20mg  tabs x 2 per dose = 40mg , he has 1-2 weeks left, he will call us with BP readings if improved BP, we can order 40mg , if still not improved BP we may change med 2. Continue HCTZ 25mg  daily 3. Encourage improved lifestyle - low sodium diet, improve regular exercise 4. Continue monitor BP outside office, bring readings to next visit, if persistently >140/90 or new symptoms notify office sooner  Future consider swap lisinopril with Losartan or other ARB OR - chlorthalidone option instead of HCTZ, or other med

## 2020-03-21 NOTE — Patient Instructions (Addendum)
Thank you for coming to the office today.  Keep on Hydrochlorothiazide 25mg  daily  INCREASE Lisinopril 20mg  x 2 = 40mg  daily  Use what you have now and call us with BP readings and request new order Lisinopril 40mg  when you are ready.  Refilled Baclofen for enough for 2 x daily. 90 day   Please schedule a Follow-up Appointment to: Return in about 3 months (around 06/19/2020) for 3 month HTN.  If you have any other questions or concerns, please feel free to call the office or send a message through [place]. You may also schedule an earlier appointment if necessary.  Additionally, you may be receiving a survey about your experience at our office within a few days to 1 week by e-mail or mail. We value your feedback.  [name], DO [place]

## 2020-03-21 NOTE — Assessment & Plan Note (Signed)
Stable, remains abstinent from alcohol No withdrawal symptoms Mood is controlled off substance

## 2020-03-21 NOTE — Assessment & Plan Note (Signed)
Followed by GI Re order Baclofen BID dosing now, 90 day rx May future follow up with GI for possible procedure for hiatal hernia

## 2020-06-19 ENCOUNTER — Ambulatory Visit: Payer: Medicare Other | Admitting: Family Medicine

## 2020-06-20 ENCOUNTER — Encounter: Payer: Self-pay | Admitting: Family Medicine

## 2020-06-20 ENCOUNTER — Other Ambulatory Visit: Payer: Self-pay

## 2020-06-20 ENCOUNTER — Other Ambulatory Visit: Payer: Self-pay | Admitting: Family Medicine

## 2020-06-20 ENCOUNTER — Ambulatory Visit (INDEPENDENT_AMBULATORY_CARE_PROVIDER_SITE_OTHER): Payer: Medicare Other | Admitting: Family Medicine

## 2020-06-20 VITALS — BP 122/70 | HR 82 | Ht 75.0 in | Wt 185.6 lb

## 2020-06-20 DIAGNOSIS — I10 Essential (primary) hypertension: Secondary | ICD-10-CM

## 2020-06-20 DIAGNOSIS — Z0000 Encounter for general adult medical examination without abnormal findings: Secondary | ICD-10-CM

## 2020-06-20 DIAGNOSIS — Z125 Encounter for screening for malignant neoplasm of prostate: Secondary | ICD-10-CM

## 2020-06-20 DIAGNOSIS — E663 Overweight: Secondary | ICD-10-CM

## 2020-06-20 DIAGNOSIS — F1021 Alcohol dependence, in remission: Secondary | ICD-10-CM

## 2020-06-20 DIAGNOSIS — R7309 Other abnormal glucose: Secondary | ICD-10-CM

## 2020-06-20 NOTE — Assessment & Plan Note (Signed)
Improved, controlled HTN now Home readings reviewed improved Prior meds: Failed CCB (Adalat, Amlodipine, swelling), Clonidine (alcohol wd only) - History of hyponatremia on thiazide Complicated by CKD-II - improved  Plan 1. Continue Lisinopril 40mg  daily 2. Continue HCTZ 25mg  daily 3. Encourage improved lifestyle - low sodium diet, improve regular exercise 4. Continue monitor BP outside office, bring readings to next visit, if persistently >140/90 or new symptoms notify office sooner  Future consider swap lisinopril with Losartan or other ARB OR - chlorthalidone option instead of HCTZ, or other med

## 2020-06-20 NOTE — Patient Instructions (Addendum)
Thank you for coming to the office today.  Keep up the great work on the blood pressure.  You have refills available.  DUE for FASTING BLOOD WORK (no food or drink after midnight before the lab appointment, only water or coffee without cream/sugar on the morning of)  SCHEDULE "Lab Only" visit in the morning at the clinic for lab draw in 6 MONTHS   - Make sure Lab Only appointment is at about 1 week before your next appointment, so that results will be available  For Lab Results, once available within 2-3 days of blood draw, you can can log in to MyChart online to view your results and a brief explanation. Also, we can discuss results at next follow-up visit.   Please schedule a Follow-up Appointment to: Return in about 6 months (around 12/20/2020) for 6 month fasting lab only then 1 week later Annual Physical.  If you have any other questions or concerns, please feel free to call the office or send a message through [place]. You may also schedule an earlier appointment if necessary.  Additionally, you may be receiving a survey about your experience at our office within a few days to 1 week by e-mail or mail. We value your feedback.  [name], DO [place]

## 2020-06-20 NOTE — Progress Notes (Signed)
Subjective:    Patient ID: John Booker, male    DOB: 09/19/1950, 70 y.o.   MRN: [id number]  John Booker is a 70 y.o. male presenting on 06/20/2020 for Hypertension   HPI  [place] HTN/ Bilateral Lower Extremity Edema CKD-II - Last visit with me 03/21/20, for visit for same problem HTN, treated with dose increase Lisinopril from 20mg  up to 40mg , see prior notes for background information. - Interval update with good results with improved BP results. - Today patient reports doing well, last checked at home yesterday 120/70 range. -Current Meds -HCTZ 25mg  daily, Lisinopril40mg  daily Lifestyle: - Diet:stable, remains alcohol free, he drinks some water, balanced diet unsure on salt content of foods   PMH Alcohol Dependence - in full remission.   Depression screen [facility] 2/9 12/20/2019 11/30/2018 11/30/2018  Decreased Interest 0 0 0  Down, Depressed, Hopeless 0 0 0  PHQ - 2 Score 0 0 0  Altered sleeping - 0 -  Tired, decreased energy - 0 -  Change in appetite - 0 -  Feeling bad or failure about yourself  - 0 -  Trouble concentrating - 0 -  Moving slowly or fidgety/restless - 0 -  Suicidal thoughts - 0 -  PHQ-9 Score - 0 -  Difficult doing work/chores - - -    Social History   Tobacco Use  . Smoking status: Former Smoker    Packs/day: 0.50    Years: 38.00    Pack years: 19.00    Types: Cigarettes  . Smokeless tobacco: Former Network engineer  . Vaping Use: Never used  Substance Use Topics  . Alcohol use: No  . Drug use: No    Review of Systems Per HPI unless specifically indicated above     Objective:    BP 122/70   Pulse 82   Ht 6\' 3"  (1.905 m)   Wt 185 lb 9.6 oz (84.2 kg)   SpO2 100%   BMI 23.20 kg/m   Wt Readings from Last 3 Encounters:  06/20/20 185 lb 9.6 oz (84.2 kg)  03/21/20 195 lb 3.2 oz (88.5 kg)  12/20/19 203 lb 9.6 oz (92.4 kg)    Physical Exam Vitals and nursing note reviewed.  Constitutional:      General: He is not in  acute distress.    Appearance: He is well-developed. He is not diaphoretic.     Comments: Well-appearing, comfortable, cooperative  HENT:     Head: Normocephalic and atraumatic.  Eyes:     General:        Right eye: No discharge.        Left eye: No discharge.     Conjunctiva/sclera: Conjunctivae normal.  Cardiovascular:     Rate and Rhythm: Normal rate.  Pulmonary:     Effort: Pulmonary effort is normal.  Skin:    General: Skin is warm and dry.     Findings: No erythema or rash.  Neurological:     Mental Status: He is alert and oriented to person, place, and time.  Psychiatric:        Behavior: Behavior normal.     Comments: Well groomed, good eye contact, normal speech and thoughts    Results for orders placed or performed in visit on 12/20/19  Hemoglobin A1c  Result Value Ref Range   Hgb A1c MFr Bld 5.4 <5.7 % of total Hgb   Mean Plasma Glucose 108 (calc)   eAG (mmol/L) 6.0 (calc)  CBC with Differential/Platelet  Result Value Ref Range   WBC 5.5 3.8 - 10.8 Thousand/uL   RBC 4.84 4.20 - 5.80 Million/uL   Hemoglobin 15.6 13.2 - 17.1 g/dL   HCT 46.1 38.5 - 50.0 %   MCV 95.2 80.0 - 100.0 fL   MCH 32.2 27.0 - 33.0 pg   MCHC 33.8 32.0 - 36.0 g/dL   RDW 11.8 11.0 - 15.0 %   Platelets 207 140 - 400 Thousand/uL   MPV 10.5 7.5 - 12.5 fL   Neutro Abs 4,015 1,500 - 7,800 cells/uL   Lymphs Abs 809 (L) 850 - 3,900 cells/uL   Absolute Monocytes 495 200 - 950 cells/uL   Eosinophils Absolute 143 15 - 500 cells/uL   Basophils Absolute 39 0 - 200 cells/uL   Neutrophils Relative % 73 %   Total Lymphocyte 14.7 %   Monocytes Relative 9.0 %   Eosinophils Relative 2.6 %   Basophils Relative 0.7 %  COMPLETE METABOLIC PANEL WITH GFR  Result Value Ref Range   Glucose, Bld 118 (H) 65 - 99 mg/dL   BUN 24 7 - 25 mg/dL   Creat 1.03 0.70 - 1.18 mg/dL   GFR, Est Non African American 73 > OR = 60 mL/min/1.26m2   GFR, Est African American 85 > OR = 60 mL/min/1.63m2   BUN/Creatinine Ratio  NOT APPLICABLE 6 - 22 (calc)   Sodium 138 135 - 146 mmol/L   Potassium 4.4 3.5 - 5.3 mmol/L   Chloride 98 98 - 110 mmol/L   CO2 32 20 - 32 mmol/L   Calcium 9.5 8.6 - 10.3 mg/dL   Total Protein 6.9 6.1 - 8.1 g/dL   Albumin 4.4 3.6 - 5.1 g/dL   Globulin 2.5 1.9 - 3.7 g/dL (calc)   AG Ratio 1.8 1.0 - 2.5 (calc)   Total Bilirubin 1.0 0.2 - 1.2 mg/dL   Alkaline phosphatase (APISO) 55 35 - 144 U/L   AST 16 10 - 35 U/L   ALT 10 9 - 46 U/L  Lipid panel  Result Value Ref Range   Cholesterol 184 <200 mg/dL   HDL 92 > OR = 40 mg/dL   Triglycerides 45 <150 mg/dL   LDL Cholesterol (Calc) 79 mg/dL (calc)   Total CHOL/HDL Ratio 2.0 <5.0 (calc)   Non-HDL Cholesterol (Calc) 92 <130 mg/dL (calc)  PSA  Result Value Ref Range   PSA 1.02 < OR = 4.0 ng/mL      Assessment & Plan:   Problem List Items Addressed This Visit    Essential hypertension - Primary    Improved, controlled HTN now Home readings reviewed improved Prior meds: Failed CCB (Adalat, Amlodipine, swelling), Clonidine (alcohol wd only) - History of hyponatremia on thiazide Complicated by CKD-II - improved  Plan 1. Continue Lisinopril 40mg  daily 2. Continue HCTZ 25mg  daily 3. Encourage improved lifestyle - low sodium diet, improve regular exercise 4. Continue monitor BP outside office, bring readings to next visit, if persistently >140/90 or new symptoms notify office sooner  Future consider swap lisinopril with Losartan or other ARB OR - chlorthalidone option instead of HCTZ, or other med         No orders of the defined types were placed in this encounter.     Follow up plan: Return in about 6 months (around 12/20/2020) for 6 month fasting lab only then 1 week later Annual Physical.  Future labs ordered for 12/2020   [name], [facility] 06/20/2020, 9:13 AM

## 2020-09-12 ENCOUNTER — Telehealth: Payer: Self-pay | Admitting: Family Medicine

## 2020-09-12 NOTE — Telephone Encounter (Signed)
Copied from [place] [id number]. Topic: Medicare AWV >> Sep 12, 2020  1:35 PM Cher Nakai R wrote: Reason for CRM:  Left message for patient to call back and schedule Medicare Annual Wellness Visit (AWV) to be done virtually or by telephone.  No hx of AWV eligible as of 02/08/2015   Please schedule at anytime with [facility].      40 Minutes appointment   Any questions, please call me at [phone]

## 2020-09-20 ENCOUNTER — Encounter: Payer: Self-pay | Admitting: Podiatry

## 2020-09-20 ENCOUNTER — Ambulatory Visit: Payer: Medicare Other | Admitting: Podiatry

## 2020-09-20 ENCOUNTER — Other Ambulatory Visit: Payer: Self-pay

## 2020-09-20 DIAGNOSIS — M216X1 Other acquired deformities of right foot: Secondary | ICD-10-CM | POA: Diagnosis not present

## 2020-09-20 DIAGNOSIS — L84 Corns and callosities: Secondary | ICD-10-CM

## 2020-09-20 DIAGNOSIS — G629 Polyneuropathy, unspecified: Secondary | ICD-10-CM | POA: Diagnosis not present

## 2020-09-20 NOTE — Progress Notes (Signed)
This patient returns to my office for at risk foot care.  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   This patient is unable to treat the ulcer  since the patient cannot reach his ulcer.No pain or drainage. at site of ulcer right foot. This patient presents for at risk foot care today.  General Appearance  Alert, conversant and in no acute stress.  Vascular  Dorsalis pedis and posterior tibial  pulses are minimally  palpable  bilaterally.  Capillary return is within normal limits  bilaterally. Temperature is within normal limits  bilaterally.  Neurologic  Senn-Weinstein monofilament wire test absent right foot.  Diminished LOPS left foot.. Muscle power within normal limits bilaterally.  Nails Normal nails noted both feet. No evidence of bacterial infection or drainage bilaterally.  Orthopedic  No limitations of motion  feet .  No crepitus or effusions noted.  No bony pathology or digital deformities noted. Plantar flexed fifth metatarsal right foot.  Skin  normotropic skin with no porokeratosis noted bilaterally.  No signs of infections .     Hemorrhagic callus sub 5th met right foot with  no signs of drainage or infection.     Pre-ulcerous callus right foot.   Consent was obtained for treatment procedures. Debridement of callus right foot with # 15 blade.  Preulcerous callus has no redness or swelling or drainage.   Return office visit  9 weeks                   Told patient to return for periodic foot care and evaluation due to potential at risk complications.   Gardiner Barefoot DPM

## 2020-10-06 ENCOUNTER — Other Ambulatory Visit: Payer: Self-pay | Admitting: Family Medicine

## 2020-10-06 DIAGNOSIS — I10 Essential (primary) hypertension: Secondary | ICD-10-CM

## 2020-10-06 NOTE — Telephone Encounter (Signed)
dc'd 03/21/20

## 2020-11-29 ENCOUNTER — Ambulatory Visit: Payer: Medicare Other | Admitting: Podiatry

## 2020-12-20 ENCOUNTER — Encounter: Payer: Self-pay | Admitting: Family Medicine

## 2020-12-20 ENCOUNTER — Other Ambulatory Visit: Payer: Self-pay

## 2020-12-20 ENCOUNTER — Ambulatory Visit (INDEPENDENT_AMBULATORY_CARE_PROVIDER_SITE_OTHER): Payer: Medicare Other | Admitting: Family Medicine

## 2020-12-20 VITALS — BP 138/74 | HR 94 | Ht 75.0 in | Wt 199.0 lb

## 2020-12-20 DIAGNOSIS — R600 Localized edema: Secondary | ICD-10-CM

## 2020-12-20 DIAGNOSIS — R7309 Other abnormal glucose: Secondary | ICD-10-CM | POA: Diagnosis not present

## 2020-12-20 DIAGNOSIS — Z125 Encounter for screening for malignant neoplasm of prostate: Secondary | ICD-10-CM | POA: Diagnosis not present

## 2020-12-20 DIAGNOSIS — I10 Essential (primary) hypertension: Secondary | ICD-10-CM

## 2020-12-20 DIAGNOSIS — Z0000 Encounter for general adult medical examination without abnormal findings: Secondary | ICD-10-CM

## 2020-12-20 DIAGNOSIS — F1021 Alcohol dependence, in remission: Secondary | ICD-10-CM

## 2020-12-20 DIAGNOSIS — E663 Overweight: Secondary | ICD-10-CM | POA: Diagnosis not present

## 2020-12-20 DIAGNOSIS — R351 Nocturia: Secondary | ICD-10-CM

## 2020-12-20 NOTE — Patient Instructions (Addendum)
Thank you for coming to the office today.  Call up Dr Rosendo Gros to follow-up again on the hiccups and hiatal hernia discussion.  Ralene Ok, MD, FACS Address: [address] [place], [place], [place] [address] Phone: [phone]  Please schedule a Follow-up Appointment to: Return in about 6 months (around 06/20/2021) for 6 month follow-up HTN edema hiccup/hernia updates?.  If you have any other questions or concerns, please feel free to call the office or send a message through [place]. You may also schedule an earlier appointment if necessary.  Additionally, you may be receiving a survey about your experience at our office within a few days to 1 week by e-mail or mail. We value your feedback.  [name], DO [place]

## 2020-12-20 NOTE — Assessment & Plan Note (Signed)
Stable, remains abstinent from alcohol No withdrawal symptoms Mood is controlled off substance

## 2020-12-20 NOTE — Progress Notes (Signed)
Subjective:    Patient ID: John Booker, male    DOB: 1950-04-03, 70 y.o.   MRN: [id number]  John Booker is a 70 y.o. male presenting on 12/20/2020 for Annual Exam   HPI  Here for Annual Physical and fasting today due labs  FOLLOW-UP CHRONIC HTN / Bilateral Lower Extremity Edema CKD-II - Home BP readings 130-140 avg, today slightly elevated - Today patient reports doing well - Current Meds - HCTZ 25mg  daily, Lisinopril 40mg  daily Lifestyle: - Diet: stable, remains alcohol free, he drinks some water, balanced diet unsure on salt content of foods Admits edema LE extremity episodic  CHRONIC HICCUPS / BELCHING GAS Chronic problem followed by [facility] on PPI initially, also trial gabapentin. Now seen by GI in [place] - and General Surgery. - Taking Baclofen PRN for hiccups, only 50% effective, he was told small hiatal hernia but he prefers not to have surgery, if vomit then his hiccups usually resolve Admits still hiccups daily basis and some belching gas   Alcohol Dependence - in remission Remains sober off alcohol     Health Maintenance:   Declines Flu Shot, Prevnar20 and COVID booster   Colon CA Screening: Last Colonoscopy >10 years ago done by outside GI, he declines again. No known fam history of colon CA   Depression screen [facility] 2/9 12/20/2020 12/20/2019 11/30/2018  Decreased Interest 0 0 0  Down, Depressed, Hopeless 0 0 0  PHQ - 2 Score 0 0 0  Altered sleeping - - 0  Tired, decreased energy - - 0  Change in appetite - - 0  Feeling bad or failure about yourself  - - 0  Trouble concentrating - - 0  Moving slowly or fidgety/restless - - 0  Suicidal thoughts - - 0  PHQ-9 Score - - 0  Difficult doing work/chores - - -    Past Medical History:  Diagnosis Date   Hypertension    Past Surgical History:  Procedure Laterality Date   ESOPHAGOGASTRODUODENOSCOPY (EGD) WITH PROPOFOL N/A 05/26/2018   Procedure: ESOPHAGOGASTRODUODENOSCOPY (EGD) WITH PROPOFOL;  Surgeon:  Virgel Manifold, MD;  Location: [facility];  Service: Endoscopy;  Laterality: N/A;   INCISION AND DRAINAGE PERIRECTAL ABSCESS N/A 11/19/2015   Procedure: IRRIGATION AND DEBRIDEMENT PERIRECTAL ABSCESS and debridement of scrotal abscess;  Surgeon: Florene Glen, MD;  Location: [facility];  Service: General;  Laterality: N/A;   INCISION AND DRAINAGE PERIRECTAL ABSCESS N/A 11/20/2015   Procedure: IRRIGATION AND DEBRIDEMENT PERIRECTAL ABSCESS / WITH DRESSING CHANGE;  Surgeon: Florene Glen, MD;  Location: [facility];  Service: General;  Laterality: N/A;  peri rectal site   INCISION AND DRAINAGE PERIRECTAL ABSCESS N/A 11/21/2015   Procedure: IRRIGATION AND DEBRIDEMENT PERIRECTAL ABSCESS WITH DRESSING CHANGE;  Surgeon: Florene Glen, MD;  Location: [facility];  Service: General;  Laterality: N/A;   MINOR HEMORRHOIDECTOMY  1989   Social History   Socioeconomic History   Marital status: Legally Separated    Spouse name: Not on file   Number of children: Not on file   Years of education: Not on file   Highest education level: Not on file  Occupational History   Not on file  Tobacco Use   Smoking status: Former    Packs/day: 0.50    Years: 38.00    Pack years: 19.00    Types: Cigarettes   Smokeless tobacco: Former  Scientific laboratory technician Use: Never used  Substance and Sexual Activity   Alcohol use: No  Drug use: No   Sexual activity: Not on file  Other Topics Concern   Not on file  Social History Narrative   Not on file   Social Determinants of Health   Financial Resource Strain: Not on file  Food Insecurity: Not on file  Transportation Needs: Not on file  Physical Activity: Not on file  Stress: Not on file  Social Connections: Not on file  Intimate Partner Violence: Not on file   Family History  Problem Relation Age of Onset   Hypertension Father    Current Outpatient Medications on File Prior to Visit  Medication Sig   baclofen (LIORESAL) 10 MG tablet Take 1 tablet  (10 mg total) by mouth 2 (two) times daily.   hydrochlorothiazide (HYDRODIURIL) 25 MG tablet Take 1 tablet (25 mg total) by mouth daily.   lisinopril (ZESTRIL) 40 MG tablet Take 1 tablet (40 mg total) by mouth daily.   sildenafil (REVATIO) 20 MG tablet Take 1-5 pills about 30 min prior to sex. Start with 1 and increase as needed.   No current facility-administered medications on file prior to visit.    Review of Systems  Constitutional:  Negative for activity change, appetite change, chills, diaphoresis, fatigue and fever.  HENT:  Negative for congestion and hearing loss.   Eyes:  Negative for visual disturbance.  Respiratory:  Negative for cough, chest tightness, shortness of breath and wheezing.   Cardiovascular:  Negative for chest pain, palpitations and leg swelling.  Gastrointestinal:  Negative for abdominal pain, constipation, diarrhea, nausea and vomiting.  Genitourinary:  Negative for dysuria, frequency and hematuria.  Musculoskeletal:  Negative for arthralgias and neck pain.  Skin:  Negative for rash.  Neurological:  Negative for dizziness, weakness, light-headedness, numbness and headaches.  Hematological:  Negative for adenopathy.  Psychiatric/Behavioral:  Negative for behavioral problems, dysphoric mood and sleep disturbance.   Per HPI unless specifically indicated above      Objective:    BP 138/74 (BP Location: Left Arm, Cuff Size: Normal)   Pulse 94   Ht 6\' 3"  (1.905 m)   Wt 199 lb (90.3 kg)   SpO2 100%   BMI 24.87 kg/m   Wt Readings from Last 3 Encounters:  12/20/20 199 lb (90.3 kg)  06/20/20 185 lb 9.6 oz (84.2 kg)  03/21/20 195 lb 3.2 oz (88.5 kg)    Physical Exam Vitals and nursing note reviewed.  Constitutional:      General: He is not in acute distress.    Appearance: He is well-developed. He is not diaphoretic.     Comments: Well-appearing, comfortable, cooperative  HENT:     Head: Normocephalic and atraumatic.  Eyes:     General:        Right  eye: No discharge.        Left eye: No discharge.     Conjunctiva/sclera: Conjunctivae normal.     Pupils: Pupils are equal, round, and reactive to light.  Neck:     Thyroid: No thyromegaly.  Cardiovascular:     Rate and Rhythm: Normal rate and regular rhythm.     Pulses: Normal pulses.     Heart sounds: Normal heart sounds. No murmur heard. Pulmonary:     Effort: Pulmonary effort is normal. No respiratory distress.     Breath sounds: Normal breath sounds. No wheezing or rales.  Abdominal:     General: Bowel sounds are normal. There is no distension.     Palpations: Abdomen is soft. There is no mass.  Tenderness: There is no abdominal tenderness.  Musculoskeletal:        General: No tenderness. Normal range of motion.     Cervical back: Normal range of motion and neck supple.     Comments: Upper / Lower Extremities: - Normal muscle tone, strength bilateral upper extremities 5/5, lower extremities 5/5  Lymphadenopathy:     Cervical: No cervical adenopathy.  Skin:    General: Skin is warm and dry.     Findings: No erythema or rash.  Neurological:     Mental Status: He is alert and oriented to person, place, and time.     Comments: Distal sensation intact to light touch all extremities  Psychiatric:        Mood and Affect: Mood normal.        Behavior: Behavior normal.        Thought Content: Thought content normal.     Comments: Well groomed, good eye contact, normal speech and thoughts   Results for orders placed or performed in visit on 12/20/19  Hemoglobin A1c  Result Value Ref Range   Hgb A1c MFr Bld 5.4 <5.7 % of total Hgb   Mean Plasma Glucose 108 (calc)   eAG (mmol/L) 6.0 (calc)  CBC with Differential/Platelet  Result Value Ref Range   WBC 5.5 3.8 - 10.8 Thousand/uL   RBC 4.84 4.20 - 5.80 Million/uL   Hemoglobin 15.6 13.2 - 17.1 g/dL   HCT 46.1 38.5 - 50.0 %   MCV 95.2 80.0 - 100.0 fL   MCH 32.2 27.0 - 33.0 pg   MCHC 33.8 32.0 - 36.0 g/dL   RDW 11.8 11.0 -  15.0 %   Platelets 207 140 - 400 Thousand/uL   MPV 10.5 7.5 - 12.5 fL   Neutro Abs 4,015 1,500 - 7,800 cells/uL   Lymphs Abs 809 (L) 850 - 3,900 cells/uL   Absolute Monocytes 495 200 - 950 cells/uL   Eosinophils Absolute 143 15 - 500 cells/uL   Basophils Absolute 39 0 - 200 cells/uL   Neutrophils Relative % 73 %   Total Lymphocyte 14.7 %   Monocytes Relative 9.0 %   Eosinophils Relative 2.6 %   Basophils Relative 0.7 %  COMPLETE METABOLIC PANEL WITH GFR  Result Value Ref Range   Glucose, Bld 118 (H) 65 - 99 mg/dL   BUN 24 7 - 25 mg/dL   Creat 1.03 0.70 - 1.18 mg/dL   GFR, Est Non African American 73 > OR = 60 mL/min/1.40m2   GFR, Est African American 85 > OR = 60 mL/min/1.66m2   BUN/Creatinine Ratio NOT APPLICABLE 6 - 22 (calc)   Sodium 138 135 - 146 mmol/L   Potassium 4.4 3.5 - 5.3 mmol/L   Chloride 98 98 - 110 mmol/L   CO2 32 20 - 32 mmol/L   Calcium 9.5 8.6 - 10.3 mg/dL   Total Protein 6.9 6.1 - 8.1 g/dL   Albumin 4.4 3.6 - 5.1 g/dL   Globulin 2.5 1.9 - 3.7 g/dL (calc)   AG Ratio 1.8 1.0 - 2.5 (calc)   Total Bilirubin 1.0 0.2 - 1.2 mg/dL   Alkaline phosphatase (APISO) 55 35 - 144 U/L   AST 16 10 - 35 U/L   ALT 10 9 - 46 U/L  Lipid panel  Result Value Ref Range   Cholesterol 184 <200 mg/dL   HDL 92 > OR = 40 mg/dL   Triglycerides 45 <150 mg/dL   LDL Cholesterol (Calc) 79 mg/dL (calc)  Total CHOL/HDL Ratio 2.0 <5.0 (calc)   Non-HDL Cholesterol (Calc) 92 <130 mg/dL (calc)  PSA  Result Value Ref Range   PSA 1.02 < OR = 4.0 ng/mL      Assessment & Plan:   Problem List Items Addressed This Visit     Overweight (BMI 25.0-29.9)   Essential hypertension    Manual BP improved Home readings reviewed improved Prior meds: Failed CCB (Adalat, Amlodipine, swelling), Clonidine (alcohol wd only) - History of hyponatremia on thiazide Complicated by CKD-II - improved  Plan 1. Continue Lisinopril 40mg  daily 2. Continue HCTZ 25mg  daily 3. Encourage improved lifestyle -  low sodium diet, improve regular exercise 4. Continue monitor BP outside office, bring readings to next visit, if persistently >140/90 or new symptoms notify office sooner      Bilateral lower extremity edema   Alcohol dependence in sustained full remission ([place])    Stable, remains abstinent from alcohol No withdrawal symptoms Mood is controlled off substance      Abnormal glucose   Other Visit Diagnoses     Annual physical exam    -  Primary   Prostate cancer screening       Nocturia           Updated Health Maintenance information Declines Flu Shots Fasting lab today Encouraged improvement to lifestyle with diet and exercise Goal of weight loss   Hiccups, chronic Return to Dr Rosendo Gros [facility] to discuss hiatal hernia and other options for diaphragm / treatment, consider other med options.   No orders of the defined types were placed in this encounter.     Follow up plan: Return in about 6 months (around 06/20/2021) for 6 month follow-up HTN edema hiccup/hernia updates?[name], DO [facility] 12/20/2020, 9:14 AM

## 2020-12-20 NOTE — Assessment & Plan Note (Signed)
Manual BP improved Home readings reviewed improved Prior meds: Failed CCB (Adalat, Amlodipine, swelling), Clonidine (alcohol wd only) - History of hyponatremia on thiazide Complicated by CKD-II - improved  Plan 1. Continue Lisinopril 40mg  daily 2. Continue HCTZ 25mg  daily 3. Encourage improved lifestyle - low sodium diet, improve regular exercise 4. Continue monitor BP outside office, bring readings to next visit, if persistently >140/90 or new symptoms notify office sooner

## 2020-12-21 LAB — HEMOGLOBIN A1C
Hgb A1c MFr Bld: 5.2 % of total Hgb (ref <5.7)
Mean Plasma Glucose: 103 mg/dL
eAG (mmol/L): 5.7 mmol/L

## 2020-12-21 LAB — CBC WITH DIFFERENTIAL/PLATELET
Absolute Monocytes: 387 cells/uL (ref 200–950)
Basophils Absolute: 29 cells/uL (ref 0–200)
Basophils Relative: 0.6 %
Eosinophils Absolute: 88 cells/uL (ref 15–500)
Eosinophils Relative: 1.8 %
HCT: 46.5 % (ref 38.5–50.0)
Hemoglobin: 15.1 g/dL (ref 13.2–17.1)
Lymphs Abs: 764 cells/uL — ABNORMAL LOW (ref 850–3900)
MCH: 32.3 pg (ref 27.0–33.0)
MCHC: 32.5 g/dL (ref 32.0–36.0)
MCV: 99.4 fL (ref 80.0–100.0)
MPV: 9.2 fL (ref 7.5–12.5)
Monocytes Relative: 7.9 %
Neutro Abs: 3631 cells/uL (ref 1500–7800)
Neutrophils Relative %: 74.1 %
Platelets: 218 10*3/uL (ref 140–400)
RBC: 4.68 10*6/uL (ref 4.20–5.80)
RDW: 13.9 % (ref 11.0–15.0)
Total Lymphocyte: 15.6 %
WBC: 4.9 10*3/uL (ref 3.8–10.8)

## 2020-12-21 LAB — COMPLETE METABOLIC PANEL WITH GFR
AG Ratio: 1.7 (calc) (ref 1.0–2.5)
ALT: 16 U/L (ref 9–46)
AST: 21 U/L (ref 10–35)
Albumin: 4 g/dL (ref 3.6–5.1)
Alkaline phosphatase (APISO): 100 U/L (ref 35–144)
BUN: 15 mg/dL (ref 7–25)
CO2: 29 mmol/L (ref 20–32)
Calcium: 9.4 mg/dL (ref 8.6–10.3)
Chloride: 99 mmol/L (ref 98–110)
Creat: 1.15 mg/dL (ref 0.70–1.28)
Globulin: 2.4 g/dL (calc) (ref 1.9–3.7)
Glucose, Bld: 103 mg/dL (ref 65–139)
Potassium: 4.9 mmol/L (ref 3.5–5.3)
Sodium: 135 mmol/L (ref 135–146)
Total Bilirubin: 0.8 mg/dL (ref 0.2–1.2)
Total Protein: 6.4 g/dL (ref 6.1–8.1)
eGFR: 68 mL/min/{1.73_m2} (ref >=60)

## 2020-12-21 LAB — LIPID PANEL
Cholesterol: 165 mg/dL (ref <200)
HDL: 62 mg/dL (ref >=40)
LDL Cholesterol (Calc): 85 mg/dL (calc)
Non-HDL Cholesterol (Calc): 103 mg/dL (calc) (ref <130)
Total CHOL/HDL Ratio: 2.7 (calc) (ref <5.0)
Triglycerides: 85 mg/dL (ref <150)

## 2020-12-21 LAB — PSA: PSA: 0.93 ng/mL (ref <=4.00)

## 2020-12-26 ENCOUNTER — Other Ambulatory Visit: Payer: Self-pay

## 2020-12-26 ENCOUNTER — Emergency Department: Payer: Medicare Other

## 2020-12-26 ENCOUNTER — Inpatient Hospital Stay
Admission: EM | Admit: 2020-12-26 | Discharge: 2020-12-31 | DRG: 391 | Disposition: A | Discharge status: Home Health | Payer: Medicare Other | Attending: Internal Medicine | Admitting: Internal Medicine

## 2020-12-26 DIAGNOSIS — Z743 Need for continuous supervision: Secondary | ICD-10-CM | POA: Diagnosis not present

## 2020-12-26 DIAGNOSIS — R1314 Dysphagia, pharyngoesophageal phase: Secondary | ICD-10-CM | POA: Diagnosis present

## 2020-12-26 DIAGNOSIS — N179 Acute kidney failure, unspecified: Secondary | ICD-10-CM | POA: Diagnosis not present

## 2020-12-26 DIAGNOSIS — Z20822 Contact with and (suspected) exposure to covid-19: Secondary | ICD-10-CM | POA: Diagnosis present

## 2020-12-26 DIAGNOSIS — E559 Vitamin D deficiency, unspecified: Secondary | ICD-10-CM | POA: Diagnosis present

## 2020-12-26 DIAGNOSIS — K2970 Gastritis, unspecified, without bleeding: Secondary | ICD-10-CM | POA: Diagnosis not present

## 2020-12-26 DIAGNOSIS — E860 Dehydration: Secondary | ICD-10-CM | POA: Diagnosis not present

## 2020-12-26 DIAGNOSIS — R066 Hiccough: Secondary | ICD-10-CM | POA: Diagnosis present

## 2020-12-26 DIAGNOSIS — Z28310 Unvaccinated for covid-19: Secondary | ICD-10-CM

## 2020-12-26 DIAGNOSIS — Z888 Allergy status to other drugs, medicaments and biological substances status: Secondary | ICD-10-CM

## 2020-12-26 DIAGNOSIS — E871 Hypo-osmolality and hyponatremia: Secondary | ICD-10-CM | POA: Diagnosis not present

## 2020-12-26 DIAGNOSIS — R609 Edema, unspecified: Secondary | ICD-10-CM

## 2020-12-26 DIAGNOSIS — F1010 Alcohol abuse, uncomplicated: Secondary | ICD-10-CM

## 2020-12-26 DIAGNOSIS — I10 Essential (primary) hypertension: Secondary | ICD-10-CM | POA: Diagnosis present

## 2020-12-26 DIAGNOSIS — E876 Hypokalemia: Secondary | ICD-10-CM | POA: Diagnosis present

## 2020-12-26 DIAGNOSIS — G9520 Unspecified cord compression: Secondary | ICD-10-CM | POA: Diagnosis present

## 2020-12-26 DIAGNOSIS — Z87891 Personal history of nicotine dependence: Secondary | ICD-10-CM

## 2020-12-26 DIAGNOSIS — G8929 Other chronic pain: Secondary | ICD-10-CM | POA: Diagnosis present

## 2020-12-26 DIAGNOSIS — E538 Deficiency of other specified B group vitamins: Secondary | ICD-10-CM | POA: Diagnosis not present

## 2020-12-26 DIAGNOSIS — R531 Weakness: Secondary | ICD-10-CM | POA: Diagnosis not present

## 2020-12-26 DIAGNOSIS — R0902 Hypoxemia: Secondary | ICD-10-CM | POA: Diagnosis not present

## 2020-12-26 DIAGNOSIS — K269 Duodenal ulcer, unspecified as acute or chronic, without hemorrhage or perforation: Secondary | ICD-10-CM | POA: Diagnosis not present

## 2020-12-26 DIAGNOSIS — R1110 Vomiting, unspecified: Secondary | ICD-10-CM | POA: Diagnosis not present

## 2020-12-26 DIAGNOSIS — K224 Dyskinesia of esophagus: Principal | ICD-10-CM | POA: Diagnosis present

## 2020-12-26 DIAGNOSIS — E440 Moderate protein-calorie malnutrition: Secondary | ICD-10-CM | POA: Insufficient documentation

## 2020-12-26 DIAGNOSIS — K449 Diaphragmatic hernia without obstruction or gangrene: Secondary | ICD-10-CM | POA: Diagnosis not present

## 2020-12-26 DIAGNOSIS — Z8249 Family history of ischemic heart disease and other diseases of the circulatory system: Secondary | ICD-10-CM

## 2020-12-26 DIAGNOSIS — R58 Hemorrhage, not elsewhere classified: Secondary | ICD-10-CM | POA: Diagnosis not present

## 2020-12-26 DIAGNOSIS — M50222 Other cervical disc displacement at C5-C6 level: Secondary | ICD-10-CM | POA: Diagnosis present

## 2020-12-26 DIAGNOSIS — J690 Pneumonitis due to inhalation of food and vomit: Secondary | ICD-10-CM | POA: Diagnosis present

## 2020-12-26 DIAGNOSIS — R509 Fever, unspecified: Secondary | ICD-10-CM

## 2020-12-26 DIAGNOSIS — Z2821 Immunization not carried out because of patient refusal: Secondary | ICD-10-CM | POA: Diagnosis present

## 2020-12-26 DIAGNOSIS — Z79899 Other long term (current) drug therapy: Secondary | ICD-10-CM

## 2020-12-26 DIAGNOSIS — R630 Anorexia: Secondary | ICD-10-CM | POA: Diagnosis present

## 2020-12-26 DIAGNOSIS — R6889 Other general symptoms and signs: Secondary | ICD-10-CM | POA: Diagnosis not present

## 2020-12-26 DIAGNOSIS — R296 Repeated falls: Secondary | ICD-10-CM | POA: Diagnosis present

## 2020-12-26 DIAGNOSIS — I959 Hypotension, unspecified: Secondary | ICD-10-CM | POA: Diagnosis not present

## 2020-12-26 DIAGNOSIS — K7010 Alcoholic hepatitis without ascites: Secondary | ICD-10-CM | POA: Diagnosis present

## 2020-12-26 DIAGNOSIS — I499 Cardiac arrhythmia, unspecified: Secondary | ICD-10-CM | POA: Diagnosis not present

## 2020-12-26 DIAGNOSIS — Z6825 Body mass index (BMI) 25.0-25.9, adult: Secondary | ICD-10-CM

## 2020-12-26 DIAGNOSIS — Y900 Blood alcohol level of less than 20 mg/100 ml: Secondary | ICD-10-CM | POA: Diagnosis present

## 2020-12-26 DIAGNOSIS — I951 Orthostatic hypotension: Secondary | ICD-10-CM | POA: Diagnosis not present

## 2020-12-26 LAB — URINALYSIS, COMPLETE (UACMP) WITH MICROSCOPIC
Bilirubin Urine: NEGATIVE
Glucose, UA: NEGATIVE mg/dL
Ketones, ur: 5 mg/dL — AB
Leukocytes,Ua: NEGATIVE
Nitrite: NEGATIVE
Protein, ur: 30 mg/dL — AB
Specific Gravity, Urine: 1.019 (ref 1.005–1.030)
pH: 5 (ref 5.0–8.0)

## 2020-12-26 LAB — RESP PANEL BY RT-PCR (FLU A&B, COVID) ARPGX2
Influenza A by PCR: NEGATIVE
Influenza B by PCR: NEGATIVE
SARS Coronavirus 2 by RT PCR: NEGATIVE

## 2020-12-26 LAB — TROPONIN I (HIGH SENSITIVITY)
Troponin I (High Sensitivity): 31 ng/L — ABNORMAL HIGH (ref <18)
Troponin I (High Sensitivity): 22 ng/L — ABNORMAL HIGH (ref <18)
Troponin I (High Sensitivity): 24 ng/L — ABNORMAL HIGH (ref <18)

## 2020-12-26 LAB — HEPATIC FUNCTION PANEL
ALT: 27 U/L (ref 0–44)
AST: 98 U/L — ABNORMAL HIGH (ref 15–41)
Albumin: 3.7 g/dL (ref 3.5–5.0)
Alkaline Phosphatase: 78 U/L (ref 38–126)
Bilirubin, Direct: 0.3 mg/dL — ABNORMAL HIGH (ref 0.0–0.2)
Indirect Bilirubin: 1.5 mg/dL — ABNORMAL HIGH (ref 0.3–0.9)
Total Bilirubin: 1.8 mg/dL — ABNORMAL HIGH (ref 0.3–1.2)
Total Protein: 7 g/dL (ref 6.5–8.1)

## 2020-12-26 LAB — CBC
HCT: 46.9 % (ref 39.0–52.0)
Hemoglobin: 15.9 g/dL (ref 13.0–17.0)
MCH: 33.5 pg (ref 26.0–34.0)
MCHC: 33.9 g/dL (ref 30.0–36.0)
MCV: 98.7 fL (ref 80.0–100.0)
Platelets: 157 10*3/uL (ref 150–400)
RBC: 4.75 MIL/uL (ref 4.22–5.81)
RDW: 15.4 % (ref 11.5–15.5)
WBC: 7.5 10*3/uL (ref 4.0–10.5)
nRBC: 0 % (ref 0.0–0.2)

## 2020-12-26 LAB — BASIC METABOLIC PANEL
Anion gap: 12 (ref 5–15)
BUN: 22 mg/dL (ref 8–23)
CO2: 26 mmol/L (ref 22–32)
Calcium: 8.9 mg/dL (ref 8.9–10.3)
Chloride: 91 mmol/L — ABNORMAL LOW (ref 98–111)
Creatinine, Ser: 1.45 mg/dL — ABNORMAL HIGH (ref 0.61–1.24)
GFR, Estimated: 52 mL/min — ABNORMAL LOW (ref >60)
Glucose, Bld: 103 mg/dL — ABNORMAL HIGH (ref 70–99)
Potassium: 4.1 mmol/L (ref 3.5–5.1)
Sodium: 129 mmol/L — ABNORMAL LOW (ref 135–145)

## 2020-12-26 LAB — ETHANOL: Alcohol, Ethyl (B): <10 mg/dL (ref <10)

## 2020-12-26 LAB — LIPASE, BLOOD: Lipase: 26 U/L (ref 11–51)

## 2020-12-26 LAB — PHOSPHORUS: Phosphorus: 2.5 mg/dL (ref 2.5–4.6)

## 2020-12-26 LAB — VITAMIN D 25 HYDROXY (VIT D DEFICIENCY, FRACTURES): Vit D, 25-Hydroxy: 14.56 ng/mL — ABNORMAL LOW (ref 30–100)

## 2020-12-26 LAB — OSMOLALITY: Osmolality: 277 mOsm/kg (ref 275–295)

## 2020-12-26 LAB — TSH: TSH: 3.1 u[IU]/mL (ref 0.350–4.500)

## 2020-12-26 LAB — MAGNESIUM: Magnesium: 1.5 mg/dL — ABNORMAL LOW (ref 1.7–2.4)

## 2020-12-26 LAB — BRAIN NATRIURETIC PEPTIDE: B Natriuretic Peptide: 218.2 pg/mL — ABNORMAL HIGH (ref 0.0–100.0)

## 2020-12-26 LAB — VITAMIN B12: Vitamin B-12: 350 pg/mL (ref 180–914)

## 2020-12-26 MED ORDER — LOPERAMIDE HCL 2 MG PO CAPS
2.0000 mg | ORAL_CAPSULE | Freq: Once | ORAL | Status: AC
  Administered 2020-12-26: 2 mg via ORAL
  Filled 2020-12-26: qty 1

## 2020-12-26 MED ORDER — SODIUM CHLORIDE 0.9 % IV SOLN
12.5000 mg | Freq: Once | INTRAVENOUS | Status: AC
  Administered 2020-12-26: 12.5 mg via INTRAVENOUS
  Filled 2020-12-26: qty 0.5

## 2020-12-26 MED ORDER — SACCHAROMYCES BOULARDII 250 MG PO CAPS
250.0000 mg | ORAL_CAPSULE | Freq: Two times a day (BID) | ORAL | Status: DC
  Administered 2020-12-26 – 2020-12-31 (×8): 250 mg via ORAL
  Filled 2020-12-26 (×11): qty 1

## 2020-12-26 MED ORDER — FOLIC ACID 1 MG PO TABS
1.0000 mg | ORAL_TABLET | Freq: Every day | ORAL | Status: DC
  Administered 2020-12-26 – 2020-12-31 (×5): 1 mg via ORAL
  Filled 2020-12-26 (×6): qty 1

## 2020-12-26 MED ORDER — ONDANSETRON HCL 4 MG/2ML IJ SOLN: 4.0000 mg | Freq: Four times a day (QID) | INTRAMUSCULAR | Status: DC | PRN

## 2020-12-26 MED ORDER — THIAMINE HCL 100 MG PO TABS
100.0000 mg | ORAL_TABLET | Freq: Every day | ORAL | Status: DC
  Administered 2020-12-26: 100 mg via ORAL
  Filled 2020-12-26: qty 1

## 2020-12-26 MED ORDER — ACETAMINOPHEN 500 MG PO TABS
1000.0000 mg | ORAL_TABLET | Freq: Once | ORAL | Status: AC
  Administered 2020-12-26: 1000 mg via ORAL
  Filled 2020-12-26: qty 2

## 2020-12-26 MED ORDER — BACLOFEN 10 MG PO TABS
10.0000 mg | ORAL_TABLET | Freq: Two times a day (BID) | ORAL | Status: DC
  Administered 2020-12-26 – 2020-12-31 (×9): 10 mg via ORAL
  Filled 2020-12-26 (×12): qty 1

## 2020-12-26 MED ORDER — BACLOFEN 10 MG PO TABS
10.0000 mg | ORAL_TABLET | Freq: Three times a day (TID) | ORAL | Status: DC
  Administered 2020-12-26: 10 mg via ORAL
  Filled 2020-12-26 (×2): qty 1

## 2020-12-26 MED ORDER — ACETAMINOPHEN 650 MG RE SUPP: 650.0000 mg | Freq: Four times a day (QID) | RECTAL | Status: AC | PRN

## 2020-12-26 MED ORDER — ADULT MULTIVITAMIN W/MINERALS CH
1.0000 | ORAL_TABLET | Freq: Every day | ORAL | Status: DC
  Administered 2020-12-26 – 2020-12-31 (×5): 1 via ORAL
  Filled 2020-12-26 (×5): qty 1

## 2020-12-26 MED ORDER — SODIUM CHLORIDE 0.9 % IV BOLUS
1000.0000 mL | Freq: Once | INTRAVENOUS | Status: AC
  Administered 2020-12-26: 1000 mL via INTRAVENOUS

## 2020-12-26 MED ORDER — ACETAMINOPHEN 325 MG PO TABS
650.0000 mg | ORAL_TABLET | Freq: Four times a day (QID) | ORAL | Status: AC | PRN
  Administered 2020-12-28: 650 mg via ORAL
  Filled 2020-12-26: qty 2

## 2020-12-26 MED ORDER — ENOXAPARIN SODIUM 40 MG/0.4ML IJ SOSY
40.0000 mg | PREFILLED_SYRINGE | INTRAMUSCULAR | Status: DC
  Administered 2020-12-26 – 2020-12-30 (×5): 40 mg via SUBCUTANEOUS
  Filled 2020-12-26 (×5): qty 0.4

## 2020-12-26 MED ORDER — LORAZEPAM 1 MG PO TABS
1.0000 mg | ORAL_TABLET | ORAL | Status: AC | PRN
  Administered 2020-12-26: 1 mg via ORAL
  Administered 2020-12-28: 2 mg via ORAL
  Filled 2020-12-26: qty 2
  Filled 2020-12-26: qty 1

## 2020-12-26 MED ORDER — LORAZEPAM 2 MG/ML IJ SOLN
1.0000 mg | INTRAMUSCULAR | Status: AC | PRN
  Administered 2020-12-28: 20:00:00 1 mg via INTRAVENOUS
  Filled 2020-12-26: qty 1

## 2020-12-26 MED ORDER — THIAMINE HCL 100 MG/ML IJ SOLN
500.0000 mg | Freq: Three times a day (TID) | INTRAVENOUS | Status: AC
  Administered 2020-12-26 – 2020-12-27 (×3): 500 mg via INTRAVENOUS
  Filled 2020-12-26 (×4): qty 5

## 2020-12-26 MED ORDER — THIAMINE HCL 100 MG/ML IJ SOLN: 100.0000 mg | Freq: Every day | INTRAMUSCULAR | Status: DC

## 2020-12-26 MED ORDER — ONDANSETRON HCL 4 MG PO TABS
4.0000 mg | ORAL_TABLET | Freq: Four times a day (QID) | ORAL | Status: DC | PRN
  Filled 2020-12-26: qty 1

## 2020-12-26 NOTE — ED Triage Notes (Addendum)
See first nurse note, pt reports fall this morning, denies hitting head, laceration to left hand bleeding controlled. States fell d/t weakness and unable to get up. +v/d for past couple days

## 2020-12-26 NOTE — ED Notes (Signed)
Informed RN bed assigned 

## 2020-12-26 NOTE — ED Notes (Signed)
Pt attached to O2 and BP monitors, bed in lowest setting, side rails up, call bell within reach, side table/belongings/urinal within reach. Pt given TV remote

## 2020-12-26 NOTE — Evaluation (Signed)
Physical Therapy Evaluation Patient Details Name: John Booker MRN: [id number] DOB: [birth year] Today's Date: 12/26/2020  History of Present Illness  NATTHEW MARLATT is a 70 y.o. male here with generalized weakness.  The patient states that over the last several days, he has had progressive worsening, generalized weakness.  He states that he has been essentially unable to get himself up out of his couch.  He has fallen twice in the last 24 hours.    Clinical Impression  Pt received in Semi-Fowler's position and agreeable to therapy.  Pt disgruntled at start of session noting that he is weak and he doesn't understand why.  Pt's daughter came into room and MD spoke with her while therapist assisted the pt with mobility.  Pt has significant pitting edema in B LE's, in which pt reports the MD has already addressed at this point.  Pt does have significant weakness in the LE's specifically with hip flexion and quads.  Pt has scuffs his feet when ambulating, and requires VC's to correct this issue.  Pt and daughter educated on proper use of stockings and to elevated LE's prior to donning hose for proper use.  Pt also has difficulty with transfer from lower chair, however was educated on proper sequencing and placement of UE's (one on walker, one on chair) and pt was able to perform much easier.    5xSTS: 22.18 sec which is indicative of increased fall risk  Pt transferred back to bed with all needs met, and call bell within reach.  Pt will benefit from skilled PT intervention to increase independence and safety with basic mobility in preparation for discharge to the venue listed below.         Recommendations for follow up therapy are one component of a multi-disciplinary discharge planning process, led by the attending physician.  Recommendations may be updated based on patient status, additional functional criteria and insurance authorization.  Follow Up Recommendations Home health PT;Supervision for  mobility/OOB    Equipment Recommendations  Rolling walker with 5" wheels    Recommendations for Other Services       Precautions / Restrictions Precautions Precautions: Fall Restrictions Weight Bearing Restrictions: No      Mobility  Bed Mobility Overal bed mobility: Modified Independent             General bed mobility comments: Pt with good ability to perform bed mobility in stretcher.    Transfers Overall transfer level: Modified independent Equipment used: Rolling walker (2 wheeled)             General transfer comment: Pt can stand from elevated surface (stretcher), however has difficulty from lower chair in ED room.  Pt given VC's for proper technique and he is able to perform much easier.  Ambulation/Gait Ambulation/Gait assistance: Min guard Gait Distance (Feet): 140 Feet Assistive device: Rolling walker (2 wheeled) Gait Pattern/deviations: WFL(Within Functional Limits);Step-through pattern;Decreased step length - right;Decreased step length - left;Decreased stride length Gait velocity: decreased.   General Gait Details: Pt able to ambulate with consistency, however tends to have decreased hip flexion and scuffs his feet at times.  He is able to correct with VC's.  Stairs            Wheelchair Mobility    Modified Rankin (Stroke Patients Only)       Balance Overall balance assessment: Mild deficits observed, not formally tested  Pertinent Vitals/Pain Pain Assessment: No/denies pain    Home Living Family/patient expects to be discharged to:: Private residence Living Arrangements: Alone Available Help at Discharge: Family;Available PRN/intermittently Type of Home: House Home Access: Stairs to enter Entrance Stairs-Rails: Psychiatric nurse of Steps: 5 Home Layout: One level Home Equipment: Walker - 2 wheels;Cane - single point;Grab bars - tub/shower       Prior Function Level of Independence: Independent with assistive device(s)         Comments: Pt reports he is utilizing walker only when he needs it.  He notes he has been so weak recently that he has had to use it more consistently.     Hand Dominance   Dominant Hand: Right    Extremity/Trunk Assessment   Upper Extremity Assessment Upper Extremity Assessment: Generalized weakness    Lower Extremity Assessment Lower Extremity Assessment: Generalized weakness;RLE deficits/detail;LLE deficits/detail RLE Deficits / Details: Pt has considerable amount of pitting edema in R LE, however has good sensation according to pt report. LLE Deficits / Details: Pt has considerable amount of pitting edema in L LE, however has good sensation according to pt report.       Communication   Communication: No difficulties  Cognition Arousal/Alertness: Awake/alert Behavior During Therapy: Restless;Agitated Overall Cognitive Status: Within Functional Limits for tasks assessed                                        General Comments      Exercises Total Joint Exercises Ankle Circles/Pumps: AROM;Strengthening;Both;10 reps;Supine Quad Sets: AROM;Strengthening;Both;10 reps;Supine Gluteal Sets: AROM;Strengthening;Both;10 reps;Supine Hip ABduction/ADduction: AROM;Strengthening;Both;10 reps;Supine Straight Leg Raises: AROM;Strengthening;Both;10 reps;Supine Long Arc Quad: AROM;Strengthening;Both;10 reps;Seated Marching in Standing: AROM;Strengthening;Both;10 reps;Standing Other Exercises Other Exercises: Pt and daughter educated on roles of PT and services provided during hospital stay.  Pt and daughter also educated on importance of exercising during stay to prevent muscle atrophy.   Assessment/Plan    PT Assessment Patient needs continued PT services  PT Problem List Decreased strength;Decreased range of motion;Decreased activity tolerance;Decreased balance;Decreased  mobility       PT Treatment Interventions DME instruction;Gait training;Stair training;Functional mobility training;Therapeutic activities;Therapeutic exercise;Balance training;Neuromuscular re-education    PT Goals (Current goals can be found in the Care Plan section)  Acute Rehab PT Goals Patient Stated Goal: to go home PT Goal Formulation: With patient Time For Goal Achievement: 01/09/21 Potential to Achieve Goals: Good    Frequency Min 2X/week   Barriers to discharge        Co-evaluation               AM-PAC PT "6 Clicks" Mobility  Outcome Measure Help needed turning from your back to your side while in a flat bed without using bedrails?: None Help needed moving from lying on your back to sitting on the side of a flat bed without using bedrails?: None Help needed moving to and from a bed to a chair (including a wheelchair)?: A Little Help needed standing up from a chair using your arms (e.g., wheelchair or bedside chair)?: A Little Help needed to walk in hospital room?: A Little Help needed climbing 3-5 steps with a railing? : A Lot 6 Click Score: 19    End of Session Equipment Utilized During Treatment: Gait belt Activity Tolerance: Patient tolerated treatment well Patient left: in bed;with call bell/phone within reach;with family/visitor present Nurse Communication: Mobility status PT Visit Diagnosis:  Unsteadiness on feet (R26.81);Repeated falls (R29.6);Muscle weakness (generalized) (M62.81);History of falling (Z91.81);Other abnormalities of gait and mobility (R26.89);Difficulty in walking, not elsewhere classified (R26.2)    Time: 9865-1686 PT Time Calculation (min) (ACUTE ONLY): 50 min   Charges:   PT Evaluation $PT Eval Low Complexity: 1 Low PT Treatments $Gait Training: 8-22 mins $Therapeutic Activity: 8-22 mins        Gwenlyn Saran, PT, DPT 12/26/20, 4:44 PM   Christie Nottingham 12/26/2020, 4:26 PM

## 2020-12-26 NOTE — H&P (Signed)
History and Physical   TOD ABRAHAMSEN WEX:[id number] DOB: 1950/06/13 DOA: 12/26/2020  PCP: Olin Hauser, DO  Outpatient Specialists: No Patient coming from: Home  I have personally briefly reviewed patient's old medical records in [place].  Chief Concern: Weakness  HPI: LYNETTE Booker is a 70 y.o. male with medical history significant for Alcohol abuse, hypertension, remote history of perirectal abscess in 2017, who presents emergency department for weakness and increased urination.  He reports the weakness has been gone going and worse recently.  He endorses poor p.o. intake for the last 2 years.  He reports that this poor p.o. intake is attribute it to his persistent and intractable hiccups.  Patient had an upper endoscopy in 05/25/2020 for intractable hiccups and per endoscopy report was found to have small hiatal hernia.  It was noted on GI that patient was referred to specialist for the small hernia.  I see a general surgery referral on 07/28/2020 for the diaphragmatic hernia without obstruction or gangrene.  Social history: Patient lives at home by himself.  He endorses current tobacco use.  Patient states that he has not had any alcohol since the Super Bowl.  Daughter at bedside states that he drinks 12 to 16 ounces of bourbon per day.  Patient denies recreational drug use.  Vaccination history: Patient is not vaccinated for COVID-19  ROS: Constitutional: no weight change, no fever ENT/Mouth: no sore throat, no rhinorrhea Eyes: no eye pain, no vision changes Cardiovascular: no chest pain, no dyspnea,  no edema, no palpitations Respiratory: no cough, no sputum, no wheezing Gastrointestinal: no nausea, no vomiting, no diarrhea, no constipation Genitourinary: no urinary incontinence, no dysuria, no hematuria Musculoskeletal: no arthralgias, no myalgias Skin: no skin lesions, no pruritus, Neuro: + weakness, no loss of consciousness, no syncope Psych: no anxiety,  no depression, + decrease appetite Heme/Lymph: no bruising, no bleeding  ED Course: Discussed with ED provider, patient requiring hospitalization for chief concerns of profound weakness.  Vitals in the ED was remarkable for temperature 98.4, respiration rate of 16, heart rate of 102, blood pressure 103/63, SPO2 of 98% on room air.  Labs in the emergency department was remarkable for sodium 129, potassium 4.1, chloride 91, bicarb 26, BUN of 22, serum creatinine of 1.45, nonfasting blood glucose 103, GFR 52, AST 98, ALT 27, BNP elevated at 218.2, high sensitive troponin is 31.  Indirect bili 1.5, T bili 1.8.  In the emergency department patient was given sodium chloride 1 L bolus, Tylenol 1000 mg, baclofen 10 mg p.o.  Assessment/Plan  Principal Problem:   Weakness Active Problems:   Essential hypertension   Alcohol abuse   Intractable hiccups   COVID-19 vaccination declined   # Intractable hiccups-discussed with neurologist who recommends one-time dose of Thorazine IV - Phosphorus, magnesium, B12, B1, TSH, - Appreciate further recommendation from neurologist - One-time dose of Thorazine 12.5 mg IV ordered  # Poor p.o. intake-Per patient he has not eaten anything in about 2 weeks - Patient states he minimally tolerates chicken broth - Family is requesting for tube feeds - GI has been consulted at patient's family request - Discussed extensively with family that at this time we will further work-up for intractable hiccups  # Alcohol abuse-patient is able to tolerate per daughter at bedside about 12 to 16 ounces of bourbon per day despite his endorsement of not being able to tolerate p.o. intake - Check the above labs - Thiamine 500 mg IV every 8 hours initiated -  CIWA protocol ordered  Chart reviewed.   DVT prophylaxis: Enoxaparin 40 mg subcutaneous every 24 hours Code Status: Full code Diet: Heart healthy Family Communication: Updated daughter, Orson Slick at bedside Disposition  Plan: Pending clinical course Consults called: Neurology, gastroenterology Admission status: MedSurg, observation, no telemetry  Past Medical History:  Diagnosis Date   Hypertension    Past Surgical History:  Procedure Laterality Date   ESOPHAGOGASTRODUODENOSCOPY (EGD) WITH PROPOFOL N/A 05/26/2018   Procedure: ESOPHAGOGASTRODUODENOSCOPY (EGD) WITH PROPOFOL;  Surgeon: Virgel Manifold, MD;  Location: [facility] ENDOSCOPY;  Service: Endoscopy;  Laterality: N/A;   INCISION AND DRAINAGE PERIRECTAL ABSCESS N/A 11/19/2015   Procedure: IRRIGATION AND DEBRIDEMENT PERIRECTAL ABSCESS and debridement of scrotal abscess;  Surgeon: Florene Glen, MD;  Location: [facility] ORS;  Service: General;  Laterality: N/A;   INCISION AND DRAINAGE PERIRECTAL ABSCESS N/A 11/20/2015   Procedure: IRRIGATION AND DEBRIDEMENT PERIRECTAL ABSCESS / WITH DRESSING CHANGE;  Surgeon: Florene Glen, MD;  Location: [facility] ORS;  Service: General;  Laterality: N/A;  peri rectal site   INCISION AND DRAINAGE PERIRECTAL ABSCESS N/A 11/21/2015   Procedure: IRRIGATION AND DEBRIDEMENT PERIRECTAL ABSCESS WITH DRESSING CHANGE;  Surgeon: Florene Glen, MD;  Location: [facility] ORS;  Service: General;  Laterality: N/A;   [place]   Social History:  reports that he has quit smoking. His smoking use included cigarettes. He has a 19.00 pack-year smoking history. He has quit using smokeless tobacco. He reports that he does not drink alcohol and does not use drugs.  Allergies  Allergen Reactions   Adalat [Nifedipine]     Foot swelling.    Amlodipine Other (See Comments)    Leg swelling   Family History  Problem Relation Age of Onset   Hypertension Father    Family history: Family history reviewed and not pertinent  Prior to Admission medications   Medication Sig Start Date End Date Taking? Authorizing Provider  baclofen (LIORESAL) 10 MG tablet Take 1 tablet (10 mg total) by mouth 2 (two) times daily. 03/21/20   Karamalegos,  Devonne Doughty, DO  hydrochlorothiazide (HYDRODIURIL) 25 MG tablet Take 1 tablet (25 mg total) by mouth daily. 12/20/19   Karamalegos, Devonne Doughty, DO  lisinopril (ZESTRIL) 40 MG tablet Take 1 tablet (40 mg total) by mouth daily. 03/21/20   Karamalegos, Devonne Doughty, DO  sildenafil (REVATIO) 20 MG tablet Take 1-5 pills about 30 min prior to sex. Start with 1 and increase as needed. 12/20/19   Olin Hauser, DO   Physical Exam: Vitals:   12/26/20 1430 12/26/20 1649 12/26/20 1744 12/26/20 2006  BP: 117/66 104/72 102/63 106/63  Pulse: 95 93 70 81  Resp:  18  16  Temp:  98.2 F (36.8 C)  97.9 F (36.6 C)  TempSrc:  Oral  Oral  SpO2: 96% 100% 96% 100%  Weight:      Height:       Constitutional: appears age appropriate, NAD, calm, comfortable Eyes: PERRL, lids and conjunctivae normal ENMT: Mucous membranes are moist. Posterior pharynx clear of any exudate or lesions. Age-appropriate dentition. Hearing appropriate Neck: normal, supple, no masses, no thyromegaly Respiratory: clear to auscultation bilaterally, no wheezing, no crackles. Normal respiratory effort. No accessory muscle use.  Cardiovascular: Regular rate and rhythm, no murmurs / rubs / gallops. No extremity edema. 2+ pedal pulses. No carotid bruits.  Abdomen: Obese abdomen, no tenderness, no masses palpated, no hepatosplenomegaly. Bowel sounds positive.  Musculoskeletal: no clubbing / cyanosis. No joint deformity upper and lower extremities.  Good ROM, no contractures, no atrophy. Normal muscle tone.  Skin: no rashes, lesions, ulcers. No induration Neurologic: Sensation intact. Strength 5/5 in all 4.  Psychiatric: Normal judgment and insight. Alert and oriented x 3. Normal mood.   EKG: independently reviewed, showing sinus tachycardia with rate of 108, first-degree heart block, QTC 428.  Chest x-ray on Admission: I personally reviewed and I agree with radiologist reading as below.  DG Chest 2 View  Result Date:  12/26/2020 CLINICAL DATA:  Weakness. EXAM: CHEST - 2 VIEW COMPARISON:  Prior chest radiographs 03/03/2017 and earlier. FINDINGS: Heart size within normal limits. No appreciable airspace consolidation. No evidence of pleural effusion or pneumothorax. No acute bony abnormality identified. IMPRESSION: No evidence of active cardiopulmonary disease. Electronically Signed   By: Kellie Simmering D.O.   On: 12/26/2020 11:23    Labs on Admission: I have personally reviewed following labs  CBC: Recent Labs  Lab 12/20/20 0946 12/26/20 0819  WBC 4.9 7.5  NEUTROABS 3,631  --   HGB 15.1 15.9  HCT 46.5 46.9  MCV 99.4 98.7  PLT 218 161   Basic Metabolic Panel: Recent Labs  Lab 12/20/20 0946 12/26/20 0819 12/26/20 1155 12/26/20 1629  NA 135 129*  --   --   K 4.9 4.1  --   --   CL 99 91*  --   --   CO2 29 26  --   --   GLUCOSE 103 103*  --   --   BUN 15 22  --   --   CREATININE 1.15 1.45*  --   --   CALCIUM 9.4 8.9  --   --   MG  --   --   --  1.5*  PHOS  --   --  2.5  --    GFR: Estimated Creatinine Clearance: 55.5 mL/min (A) (by C-G formula based on SCr of 1.45 mg/dL (H)).  Liver Function Tests: Recent Labs  Lab 12/20/20 0946 12/26/20 0819  AST 21 98*  ALT 16 27  ALKPHOS  --  78  BILITOT 0.8 1.8*  PROT 6.4 7.0  ALBUMIN  --  3.7   Recent Labs  Lab 12/26/20 0819  LIPASE 26   Urine analysis:    Component Value Date/Time   COLORURINE AMBER (A) 12/26/2020 1155   APPEARANCEUR HAZY (A) 12/26/2020 1155   LABSPEC 1.019 12/26/2020 1155   PHURINE 5.0 12/26/2020 1155   GLUCOSEU NEGATIVE 12/26/2020 1155   HGBUR MODERATE (A) 12/26/2020 1155   BILIRUBINUR NEGATIVE 12/26/2020 1155   KETONESUR 5 (A) 12/26/2020 1155   PROTEINUR 30 (A) 12/26/2020 1155   NITRITE NEGATIVE 12/26/2020 [place] 12/26/2020 1155   Dr. Tobie Poet [facility]  If 7PM-7AM, please contact overnight-coverage provider If 7AM-7PM, please contact day coverage  provider [email or web address]  12/26/2020, 9:01 PM

## 2020-12-26 NOTE — ED Notes (Signed)
This RN called lab and confirmed they are able to run add-on labs from specimens on there

## 2020-12-26 NOTE — ED Provider Notes (Signed)
[facility] Emergency Department Provider Note  ____________________________________________   Event Date/Time   First MD Initiated Contact with Patient 12/26/20 [id number]     (approximate)  I have reviewed the triage vital signs and the nursing notes.   HISTORY  Chief Complaint Weakness and Fall    HPI John Booker is a 70 y.o. male here with generalized weakness.  The patient states that over the last several days, he has had progressive worsening, generalized weakness.  He states that he has been essentially unable to get himself up out of his couch.  He has fallen twice in the last 24 hours.  This occurs in a several week history of progressive decline, though it is fairly acutely worse over the last several days per family.  The patient had to have his family member who lives next-door help him off the ground twice.  He reports general weakness.  He does report that his urine has been dark.  He has had poor appetite which he attributes to chronic hiccups, for which she is on baclofen.  He is seeing a specialist for this in [place].  Denies any fevers.  No abdominal pain.  No cough or shortness of breath.  No known COVID exposures.    Past Medical History:  Diagnosis Date   Hypertension     Patient Active Problem List   Diagnosis Date Noted   COVID-19 vaccination declined 03/21/2020   Overweight (BMI 25.0-29.9) 12/20/2019   Blister of skin 11/07/2019   Neuropathy 04/04/2019   Pre-ulcerative calluses 10/07/2018   Skin lesion 10/07/2018   Plantar flexed metatarsal bone of right foot 10/07/2018   Hiatal hernia    Stomach irritation    Intractable hiccups 10/16/2017   Elevated serum creatinine 07/15/2017   Bilateral lower extremity edema 05/08/2017   Alcohol dependence in sustained full remission ([place]) 03/05/2017   Abnormal glucose 12/20/2015   Essential hypertension 11/15/2015   Vitamin D deficiency 11/15/2015    Past Surgical History:   Procedure Laterality Date   ESOPHAGOGASTRODUODENOSCOPY (EGD) WITH PROPOFOL N/A 05/26/2018   Procedure: ESOPHAGOGASTRODUODENOSCOPY (EGD) WITH PROPOFOL;  Surgeon: Virgel Manifold, MD;  Location: [facility] ENDOSCOPY;  Service: Endoscopy;  Laterality: N/A;   INCISION AND DRAINAGE PERIRECTAL ABSCESS N/A 11/19/2015   Procedure: IRRIGATION AND DEBRIDEMENT PERIRECTAL ABSCESS and debridement of scrotal abscess;  Surgeon: Florene Glen, MD;  Location: [facility] ORS;  Service: General;  Laterality: N/A;   INCISION AND DRAINAGE PERIRECTAL ABSCESS N/A 11/20/2015   Procedure: IRRIGATION AND DEBRIDEMENT PERIRECTAL ABSCESS / WITH DRESSING CHANGE;  Surgeon: Florene Glen, MD;  Location: [facility] ORS;  Service: General;  Laterality: N/A;  peri rectal site   INCISION AND DRAINAGE PERIRECTAL ABSCESS N/A 11/21/2015   Procedure: IRRIGATION AND DEBRIDEMENT PERIRECTAL ABSCESS WITH DRESSING CHANGE;  Surgeon: Florene Glen, MD;  Location: [facility] ORS;  Service: General;  Laterality: N/A;   [place]    Prior to Admission medications   Medication Sig Start Date End Date Taking? Authorizing Provider  baclofen (LIORESAL) 10 MG tablet Take 1 tablet (10 mg total) by mouth 2 (two) times daily. 03/21/20   Karamalegos, Devonne Doughty, DO  hydrochlorothiazide (HYDRODIURIL) 25 MG tablet Take 1 tablet (25 mg total) by mouth daily. 12/20/19   Karamalegos, Devonne Doughty, DO  lisinopril (ZESTRIL) 40 MG tablet Take 1 tablet (40 mg total) by mouth daily. 03/21/20   Karamalegos, Devonne Doughty, DO  sildenafil (REVATIO) 20 MG tablet Take 1-5 pills about 30 min prior  to sex. Start with 1 and increase as needed. 12/20/19   Karamalegos, Devonne Doughty, DO    Allergies Adalat [nifedipine] and Amlodipine  Family History  Problem Relation Age of Onset   Hypertension Father     Social History Social History   Tobacco Use   Smoking status: Former    Packs/day: 0.50    Years: 38.00    Pack years: 19.00    Types: Cigarettes    Smokeless tobacco: Former  Scientific laboratory technician Use: Never used  Substance Use Topics   Alcohol use: No   Drug use: No    Review of Systems  Review of Systems  Constitutional:  Positive for fatigue. Negative for chills and fever.  HENT:  Negative for sore throat.   Respiratory:  Negative for shortness of breath.   Cardiovascular:  Positive for leg swelling. Negative for chest pain.  Gastrointestinal:  Negative for abdominal pain.  Genitourinary:  Negative for flank pain.  Musculoskeletal:  Negative for neck pain.  Skin:  Negative for rash and wound.  Allergic/Immunologic: Negative for immunocompromised state.  Neurological:  Positive for weakness. Negative for numbness.  Hematological:  Does not bruise/bleed easily.    ____________________________________________  PHYSICAL EXAM:      VITAL SIGNS: ED Triage Vitals  Enc Vitals Group     BP 12/26/20 0807 107/63     Pulse Rate 12/26/20 0807 (!) 102     Resp 12/26/20 0807 16     Temp 12/26/20 0807 98.4 F (36.9 C)     Temp Source 12/26/20 0807 Oral     SpO2 12/26/20 0807 98 %     Weight 12/26/20 0812 185 lb (83.9 kg)     Height 12/26/20 0812 6\' 3"  (1.905 m)     Head Circumference --      Peak Flow --      Pain Score 12/26/20 0811 0     Pain Loc --      Pain Edu? --      Excl. in [place]? --      Physical Exam Vitals and nursing note reviewed.  Constitutional:      General: He is not in acute distress.    Appearance: He is well-developed.  HENT:     Head: Normocephalic and atraumatic.  Eyes:     Conjunctiva/sclera: Conjunctivae normal.  Cardiovascular:     Rate and Rhythm: Regular rhythm.     Heart sounds: Normal heart sounds. No murmur heard.   No friction rub.  Pulmonary:     Effort: Pulmonary effort is normal. No respiratory distress.     Breath sounds: Normal breath sounds. No wheezing or rales.  Abdominal:     General: There is no distension.     Palpations: Abdomen is soft.     Tenderness: There is no  abdominal tenderness.  Musculoskeletal:     Cervical back: Neck supple.     Right lower leg: Edema present.     Left lower leg: Edema present.  Skin:    General: Skin is warm.     Capillary Refill: Capillary refill takes less than 2 seconds.  Neurological:     Mental Status: He is alert and oriented to person, place, and time.     Motor: No abnormal muscle tone.      ____________________________________________   LABS (all labs ordered are listed, but only abnormal results are displayed)  Labs Reviewed  BASIC METABOLIC PANEL - Abnormal; Notable for the following components:  Result Value   Sodium 129 (*)    Chloride 91 (*)    Glucose, Bld 103 (*)    Creatinine, Ser 1.45 (*)    GFR, Estimated 52 (*)    All other components within normal limits  URINALYSIS, COMPLETE (UACMP) WITH MICROSCOPIC - Abnormal; Notable for the following components:   Color, Urine AMBER (*)    APPearance HAZY (*)    Hgb urine dipstick MODERATE (*)    Ketones, ur 5 (*)    Protein, ur 30 (*)    Bacteria, UA RARE (*)    All other components within normal limits  HEPATIC FUNCTION PANEL - Abnormal; Notable for the following components:   AST 98 (*)    Total Bilirubin 1.8 (*)    Bilirubin, Direct 0.3 (*)    Indirect Bilirubin 1.5 (*)    All other components within normal limits  BRAIN NATRIURETIC PEPTIDE - Abnormal; Notable for the following components:   B Natriuretic Peptide 218.2 (*)    All other components within normal limits  TROPONIN I (HIGH SENSITIVITY) - Abnormal; Notable for the following components:   Troponin I (High Sensitivity) 31 (*)    All other components within normal limits  RESP PANEL BY RT-PCR (FLU A&B, COVID) ARPGX2  CBC  LIPASE, BLOOD  OSMOLALITY  TROPONIN I (HIGH SENSITIVITY)    ____________________________________________  EKG: Sinus tachycardia with first-degree AV block.  Possible left atrial enlargement.  Ventricular rate 108, PR 216, QRS 66, QTc 428.  No acute  ST elevations or depressions. ________________________________________  RADIOLOGY All imaging, including plain films, CT scans, and ultrasounds, independently reviewed by me, and interpretations confirmed via formal radiology reads.  ED MD interpretation:   Chest x-ray: Clear  Official radiology report(s): DG Chest 2 View  Result Date: 12/26/2020 CLINICAL DATA:  Weakness. EXAM: CHEST - 2 VIEW COMPARISON:  Prior chest radiographs 03/03/2017 and earlier. FINDINGS: Heart size within normal limits. No appreciable airspace consolidation. No evidence of pleural effusion or pneumothorax. No acute bony abnormality identified. IMPRESSION: No evidence of active cardiopulmonary disease. Electronically Signed   By: Kellie Simmering D.O.   On: 12/26/2020 11:23    ____________________________________________  PROCEDURES   Procedure(s) performed (including Critical Care):  .1-3 Lead EKG Interpretation Performed by: Duffy Bruce, MD Authorized by: Duffy Bruce, MD     Interpretation: normal     ECG rate:  90-100   ECG rate assessment: normal     Rhythm: sinus rhythm     Ectopy: none     Conduction: normal   Comments:     Indication: weakness  ____________________________________________  INITIAL IMPRESSION / MDM / [place] / ED COURSE  As part of my medical decision making, I reviewed the following data within the [facility] notes reviewed and incorporated, Old chart reviewed, Notes from prior ED visits, and [place] Controlled Substance Database       *SHANNEN VERNON was evaluated in Emergency Department on 12/26/2020 for the symptoms described in the history of present illness. He was evaluated in the context of the global COVID-19 pandemic, which necessitated consideration that the patient might be at risk for infection with the SARS-CoV-2 virus that causes COVID-19. Institutional protocols and algorithms that pertain to the evaluation of patients at  risk for COVID-19 are in a state of rapid change based on information released by regulatory bodies including the CDC and federal and state organizations. These policies and algorithms were followed during the patient's care in  the ED.  Some ED evaluations and interventions may be delayed as a result of limited staffing during the pandemic.*     Medical Decision Making: 70 year old male here with generalized weakness and falls x2.  The patient presents from home and has fallen twice in the last 24 hours.  He has a history of chronic alcoholism, though now in remission, chronic hiccups.  I suspect acute on chronic deconditioning and dehydration in setting of poor p.o. intake related to his hiccups, with possible component of UTI or other metabolic encephalopathy.  Patient does seem weak here and although he is able to ambulate with a walker, he is essentially unable to get himself up from sitting.  No focal neurological deficits noted.  I reviewed his recent PCP visit from 12/2020 and other visits, which show that this is a somewhat chronic issue although it does appear acutely worsened compared to his usual baseline.  Lab work shows moderate BNP and troponin elevation, which is interesting as he has no known cardiac history.  He does have pitting edema in his bilateral lower extremities.  I suspected this was related to hypoalbuminemia but albumin appears normal on BMP.  May ultimately benefit from an echocardiogram.  Otherwise, urinalysis shows no signs of UTI but does show ketonuria consistent with dehydration.  Mild hyponatremia is likely also related to this.  Will admit as he remains unable to stand up on his own, lives alone, with generalized weakness and findings which could suggest concomitant CHF as well as relative dehydration.  ____________________________________________  FINAL CLINICAL IMPRESSION(S) / ED DIAGNOSES  Final diagnoses:  Dehydration  Orthostasis  Pitting edema      MEDICATIONS GIVEN DURING THIS VISIT:  Medications  sodium chloride 0.9 % bolus 1,000 mL (1,000 mLs Intravenous New Bag/Given 12/26/20 1153)  acetaminophen (TYLENOL) tablet 1,000 mg (1,000 mg Oral Given 12/26/20 1303)     ED Discharge Orders     None        Note:  This document was prepared using Dragon voice recognition software and may include unintentional dictation errors.   Duffy Bruce, MD 12/26/20 1327

## 2020-12-26 NOTE — Progress Notes (Signed)
[facility] GI Courtesy Note  I was asked to see patient by Dr. Rupert Stacks for consideration of PEG tube placement for limited intake of volitional feeding secondary to intractable hiccups.  The PEG was reportedly requested by family, but it does not appear the patient has ever seen a neurologist to explore other medical therapies for hiccups.   I believe therapeutic options should be exhausted prior to pursuing a semi-permanent mode of non-volitional feeding such as PEG feedings. These decisions must be made by providers closest to the patient and family to counsel and recommend all options available.  If the decision for PEG is made after a thoughtful and multidisciplinary workup, it would be my pleasure to provide non-volitional enteral access as directed.  Sincerely,    T. Madolyn Frieze, M.D. ABIM Diplomate in Gastroenterology [place]

## 2020-12-26 NOTE — ED Notes (Signed)
Ambulated patient around his room with a walker (his baseline), patient tolerated walking well. Denied dizziness. BP 133/78 at rest.

## 2020-12-26 NOTE — ED Triage Notes (Signed)
Pt comes into the ED via [facility] from home c/o weakness and increased urination.  Pt also admits to a loss of appetite.  Pt was found in the floor by the son in law this morning.  Pt states he slid into the floor because of the weakness.  Pt did have incontinence this morning when found with loose watery stool.  Pt has pedal edema that has been increasing over the last couple weeks.  Pt denies any SHOB or pain and denies any h/o CHF. A&Ox4.    110/60 92% RA 100 HR 116 CBG 98 oral

## 2020-12-27 ENCOUNTER — Observation Stay: Payer: Medicare Other

## 2020-12-27 DIAGNOSIS — N179 Acute kidney failure, unspecified: Secondary | ICD-10-CM | POA: Diagnosis not present

## 2020-12-27 DIAGNOSIS — R531 Weakness: Secondary | ICD-10-CM

## 2020-12-27 DIAGNOSIS — E440 Moderate protein-calorie malnutrition: Secondary | ICD-10-CM | POA: Diagnosis not present

## 2020-12-27 DIAGNOSIS — K224 Dyskinesia of esophagus: Secondary | ICD-10-CM | POA: Diagnosis not present

## 2020-12-27 LAB — BASIC METABOLIC PANEL WITH GFR
Anion gap: 8 (ref 5–15)
BUN: 23 mg/dL (ref 8–23)
CO2: 27 mmol/L (ref 22–32)
Calcium: 8.4 mg/dL — ABNORMAL LOW (ref 8.9–10.3)
Chloride: 97 mmol/L — ABNORMAL LOW (ref 98–111)
Creatinine, Ser: 1.52 mg/dL — ABNORMAL HIGH (ref 0.61–1.24)
GFR, Estimated: 49 mL/min — ABNORMAL LOW
Glucose, Bld: 85 mg/dL (ref 70–99)
Potassium: 3.3 mmol/L — ABNORMAL LOW (ref 3.5–5.1)
Sodium: 132 mmol/L — ABNORMAL LOW (ref 135–145)

## 2020-12-27 LAB — CBC
HCT: 40.2 % (ref 39.0–52.0)
Hemoglobin: 13.9 g/dL (ref 13.0–17.0)
MCH: 34.8 pg — ABNORMAL HIGH (ref 26.0–34.0)
MCHC: 34.6 g/dL (ref 30.0–36.0)
MCV: 100.8 fL — ABNORMAL HIGH (ref 80.0–100.0)
Platelets: 129 K/uL — ABNORMAL LOW (ref 150–400)
RBC: 3.99 MIL/uL — ABNORMAL LOW (ref 4.22–5.81)
RDW: 15.3 % (ref 11.5–15.5)
WBC: 4 K/uL (ref 4.0–10.5)
nRBC: 0 % (ref 0.0–0.2)

## 2020-12-27 MED ORDER — VITAMIN B-12 1000 MCG PO TABS
1000.0000 ug | ORAL_TABLET | Freq: Every day | ORAL | Status: DC
  Administered 2020-12-29 – 2020-12-31 (×3): 1000 ug via ORAL
  Filled 2020-12-27 (×4): qty 1

## 2020-12-27 MED ORDER — SODIUM CHLORIDE 0.9 % IV SOLN
12.5000 mg | Freq: Four times a day (QID) | INTRAVENOUS | Status: DC | PRN
  Administered 2020-12-28: 12.5 mg via INTRAVENOUS
  Filled 2020-12-27 (×2): qty 0.5

## 2020-12-27 MED ORDER — MAGNESIUM SULFATE 2 GM/50ML IV SOLN
2.0000 g | Freq: Once | INTRAVENOUS | Status: AC
  Administered 2020-12-27: 10:00:00 2 g via INTRAVENOUS
  Filled 2020-12-27: qty 50

## 2020-12-27 MED ORDER — POTASSIUM CHLORIDE CRYS ER 20 MEQ PO TBCR
40.0000 meq | EXTENDED_RELEASE_TABLET | Freq: Once | ORAL | Status: AC
  Administered 2020-12-27: 40 meq via ORAL
  Filled 2020-12-27: qty 2

## 2020-12-27 MED ORDER — SODIUM CHLORIDE 0.9 % IV SOLN: INTRAVENOUS | Status: DC

## 2020-12-27 MED ORDER — VITAMIN D (ERGOCALCIFEROL) 1.25 MG (50000 UNIT) PO CAPS
50000.0000 [IU] | ORAL_CAPSULE | ORAL | Status: DC
  Administered 2020-12-27: 50000 [IU] via ORAL
  Filled 2020-12-27 (×2): qty 1

## 2020-12-27 NOTE — Progress Notes (Signed)
PROGRESS NOTE    John Booker   YKZ:[id number]  DOB: [birth year]  PCP: Olin Hauser, DO    DOA: 12/26/2020 LOS: 0    Brief Narrative / Hospital Course to Date:   70 y.o. male with medical history significant for Alcohol abuse, hypertension, remote history of perirectal abscess in 2017, who presents emergency department for profound generalized weakness and increased urination.  Weakness has been slowly progressive in the setting of inadequate p.o. intake due to intractable hiccups and regurgitating of most solid foods.  He does okay with liquids or very soft foods such as ice cream or applesauce.  Prior EGD 05/25/2020 obtained for intractable hiccups, showed a small hiatal hernia.  Of note, patient's family report he drinks 12 to 16 ounces of bourbon daily, lives alone.  Assessment & Plan   Principal Problem:   Weakness Active Problems:   Essential hypertension   Alcohol abuse   Intractable hiccups   COVID-19 vaccination declined   Generalized weakness due to Inadequate oral intake Regurgitation of solid foods  Intractable hiccups Hiatal hernia Given Thorazine IV on admission per recommendation by neurology. Seems he was responded well, denies issues with hiccups when seen today (10/20). --SLP consulted for swallow evaluation --Modified barium swallow studies pending --Neurology consulted, appreciate input regarding intractable hiccups --Consider GI consult depending on MBSS results --Dietitian consult --Maintenance IV fluids  Electrolyte abnormalities: Hypokalemia, hypomagnesemia, hyponatremia 10/20: K3.3, mag museum 1.5, sodium 132.   Replacing potassium orally and magnesium by IV --Monitor and replace as needed --Sodium improved with fluids given on admission, monitor  AKI -present on admission with creatinine 1.45, compared to baseline 1.15 on 12/20/2020. Suspect prerenal azotemia due to poor p.o. intake and dehydration. -- Resume maintenance IV  fluids given persistent intolerance to p.o. intake -- Follow BMP -- Renal ultrasound pending   Alcohol use disorder -Per daughter, patient consumes about 12 to 16 ounces of bourbon daily. --CIWA protocol with as needed Ativan --IV thiamine --Monitor and replace electrolytes --Consider maintenance medication to reduce alcohol cravings in outpatient setting --TOC consult to provide resources to assist with cessation  Essential hypertension -currently with soft blood pressure, sewed of hypotension. Take lisinopril and HCTZ at home. --Hold antihypertensives for now --Monitor BP and resume therapy and when indicated  Continue home baclofen -indication unclear but possibly started for intractable hiccups  Patient BMI: Body mass index is 23.12 kg/m.   DVT prophylaxis: enoxaparin (LOVENOX) injection 40 mg Start: 12/26/20 2200 Place TED hose Start: 12/26/20 1355   Diet:  Diet Orders (From admission, onward)     Start     Ordered   12/26/20 1355  Diet Heart Room service appropriate? Yes; Fluid consistency: Thin  Diet effective now       Question Answer Comment  Room service appropriate? Yes   Fluid consistency: Thin      12/26/20 1355              Code Status: Full Code   Subjective 12/27/20    Patient seen with daughter at bedside today.  He reports solids get stuck and come back up.  He tolerates drinking liquids and things like ice cream and applesauce.  He denies having hiccups since Thorazine given on admission.  He states the hiccups stopped after he ate some ice cream.  Reports profound weakness due to being unable to eat.  Daughter reports profound weight loss.  No other acute complaints at this time.   Disposition Plan & Communication  Status is: Observation  The patient remains OBS appropriate and will d/c before 2 midnights.   Family Communication: Daughter at bedside on rounds today, 10/20   Consults, Procedures, Significant Events   Consultants:   Neurology  Procedures:  Barium swallow study pending  Antimicrobials:  Anti-infectives (From admission, onward)    None         Micro    Objective   Vitals:   12/27/20 0031 12/27/20 0507 12/27/20 0744 12/27/20 1205  BP: 108/62 121/66 108/72 115/60  Pulse: 94 87 82 87  Resp:  18 15 16   Temp: 97.6 F (36.4 C) 97.6 F (36.4 C) 97.7 F (36.5 C) 97.8 F (36.6 C)  TempSrc: Oral Oral Oral Oral  SpO2:   100% 100%  Weight:      Height:        Intake/Output Summary (Last 24 hours) at 12/27/2020 1445 Last data filed at 12/27/2020 1202 Gross per 24 hour  Intake 100 ml  Output 550 ml  Net -450 ml   Filed Weights   12/26/20 0812  Weight: 83.9 kg    Physical Exam:  General exam: awake, alert, no acute distress, chronically ill-appearing HEENT: atraumatic, clear conjunctiva, anicteric sclera, moist mucus membranes, hearing grossly normal  Respiratory system: CTAB, no wheezes, rales or rhonchi, normal respiratory effort. Cardiovascular system: normal S1/S2, RRR, bilateral pedal edema.   Gastrointestinal system: soft, NT, ND, no HSM felt, +bowel sounds. Central nervous system: A&O x3. no gross focal neurologic deficits, normal speech Psychiatry: normal mood, congruent affect, judgement and insight appear normal  Labs   Data Reviewed: I have personally reviewed following labs and imaging studies  CBC: Recent Labs  Lab 12/26/20 0819 12/27/20 0452  WBC 7.5 4.0  HGB 15.9 13.9  HCT 46.9 40.2  MCV 98.7 100.8*  PLT 157 161*   Basic Metabolic Panel: Recent Labs  Lab 12/26/20 0819 12/26/20 1155 12/26/20 1629 12/27/20 0452  NA 129*  --   --  132*  K 4.1  --   --  3.3*  CL 91*  --   --  97*  CO2 26  --   --  27  GLUCOSE 103*  --   --  85  BUN 22  --   --  23  CREATININE 1.45*  --   --  1.52*  CALCIUM 8.9  --   --  8.4*  MG  --   --  1.5*  --   PHOS  --  2.5  --   --    GFR: Estimated Creatinine Clearance: 52.9 mL/min (A) (by C-G formula based on SCr of  1.52 mg/dL (H)). Liver Function Tests: Recent Labs  Lab 12/26/20 0819  AST 98*  ALT 27  ALKPHOS 78  BILITOT 1.8*  PROT 7.0  ALBUMIN 3.7   Recent Labs  Lab 12/26/20 0819  LIPASE 26   No results for input(s): AMMONIA in the last 168 hours. Coagulation Profile: No results for input(s): INR, PROTIME in the last 168 hours. Cardiac Enzymes: No results for input(s): CKTOTAL, CKMB, CKMBINDEX, TROPONINI in the last 168 hours. BNP (last 3 results) No results for input(s): PROBNP in the last 8760 hours. HbA1C: No results for input(s): HGBA1C in the last 72 hours. CBG: No results for input(s): GLUCAP in the last 168 hours. Lipid Profile: No results for input(s): CHOL, HDL, LDLCALC, TRIG, CHOLHDL, LDLDIRECT in the last 72 hours. Thyroid Function Tests: Recent Labs    12/26/20 1155  TSH 3.100  Anemia Panel: Recent Labs    12/26/20 1527  VITAMINB12 350   Sepsis Labs: No results for input(s): PROCALCITON, LATICACIDVEN in the last 168 hours.  Recent Results (from the past 240 hour(s))  Resp Panel by RT-PCR (Flu A&B, Covid) Nasopharyngeal Swab     Status: None   Collection Time: 12/26/20  1:03 PM   Specimen: Nasopharyngeal Swab; Nasopharyngeal(NP) swabs in vial transport medium  Result Value Ref Range Status   SARS Coronavirus 2 by RT PCR NEGATIVE NEGATIVE Final    Comment: (NOTE) SARS-CoV-2 target nucleic acids are NOT DETECTED.  The SARS-CoV-2 RNA is generally detectable in upper respiratory specimens during the acute phase of infection. The lowest concentration of SARS-CoV-2 viral copies this assay can detect is 138 copies/mL. A negative result does not preclude SARS-Cov-2 infection and should not be used as the sole basis for treatment or other patient management decisions. A negative result may occur with  improper specimen collection/handling, submission of specimen other than nasopharyngeal swab, presence of viral mutation(s) within the areas targeted by this  assay, and inadequate number of viral copies(<138 copies/mL). A negative result must be combined with clinical observations, patient history, and epidemiological information. The expected result is Negative.  Fact Sheet for Patients:  EntrepreneurPulse.com.au  Fact Sheet for Healthcare Providers:  IncredibleEmployment.be  This test is no t yet approved or cleared by the [place] FDA and  has been authorized for detection and/or diagnosis of SARS-CoV-2 by FDA under an Emergency Use Authorization (EUA). This EUA will remain  in effect (meaning this test can be used) for the duration of the COVID-19 declaration under Section 564(b)(1) of the Act, 21 U.S.C.section 360bbb-3(b)(1), unless the authorization is terminated  or revoked sooner.       Influenza A by PCR NEGATIVE NEGATIVE Final   Influenza B by PCR NEGATIVE NEGATIVE Final    Comment: (NOTE) The Xpert Xpress SARS-CoV-2/FLU/RSV plus assay is intended as an aid in the diagnosis of influenza from Nasopharyngeal swab specimens and should not be used as a sole basis for treatment. Nasal washings and aspirates are unacceptable for Xpert Xpress SARS-CoV-2/FLU/RSV testing.  Fact Sheet for Patients: EntrepreneurPulse.com.au  Fact Sheet for Healthcare Providers: IncredibleEmployment.be  This test is not yet approved or cleared by the [place] FDA and has been authorized for detection and/or diagnosis of SARS-CoV-2 by FDA under an Emergency Use Authorization (EUA). This EUA will remain in effect (meaning this test can be used) for the duration of the COVID-19 declaration under Section 564(b)(1) of the Act, 21 U.S.C. section 360bbb-3(b)(1), unless the authorization is terminated or revoked.  Performed at [facility], [address], [place],  [address]       Imaging Studies   DG Chest 2 View  Result Date: 12/26/2020 CLINICAL  DATA:  Weakness. EXAM: CHEST - 2 VIEW COMPARISON:  Prior chest radiographs 03/03/2017 and earlier. FINDINGS: Heart size within normal limits. No appreciable airspace consolidation. No evidence of pleural effusion or pneumothorax. No acute bony abnormality identified. IMPRESSION: No evidence of active cardiopulmonary disease. Electronically Signed   By: Kellie Simmering D.O.   On: 12/26/2020 11:23     Medications   Scheduled Meds:  baclofen  10 mg Oral BID   enoxaparin (LOVENOX) injection  40 mg Subcutaneous X91Y   folic acid  1 mg Oral Daily   multivitamin with minerals  1 tablet Oral Daily   saccharomyces boulardii  250 mg Oral BID   Continuous Infusions:  LOS: 0 days    Time spent: 30 minutes    Ezekiel Slocumb, DO [facility]  12/27/2020, 2:45 PM      If 7PM-7AM, please contact night-coverage. How to contact the [facility] Attending or Consulting provider [place] or covering provider during after hours [place], for this patient?    Check the care team in [facility] and look for a) attending/consulting [facility] provider listed and b) the [facility] team listed Log into [email or web address] and use [place] universal password to access. If you do not have the password, please contact the hospital operator. Locate the [facility] provider you are looking for under [facility] and page to a number that you can be directly reached. If you still have difficulty reaching the provider, please page the [facility] (Director on Call) for the Hospitalists listed on amion for assistance.

## 2020-12-27 NOTE — Progress Notes (Signed)
Clinical/Bedside Swallow Evaluation Patient Details  Name: John Booker MRN: [id number] Date of Birth: [birth year]  Today's Date: 12/27/2020 Time: SLP Start Time (ACUTE ONLY): 1215 SLP Stop Time (ACUTE ONLY): 1240 SLP Time Calculation (min) (ACUTE ONLY): 25 min  Past Medical History:  Past Medical History:  Diagnosis Date   Hypertension    Past Surgical History:  Past Surgical History:  Procedure Laterality Date   ESOPHAGOGASTRODUODENOSCOPY (EGD) WITH PROPOFOL N/A 05/26/2018   Procedure: ESOPHAGOGASTRODUODENOSCOPY (EGD) WITH PROPOFOL;  Surgeon: Virgel Manifold, MD;  Location: [facility] ENDOSCOPY;  Service: Endoscopy;  Laterality: N/A;   INCISION AND DRAINAGE PERIRECTAL ABSCESS N/A 11/19/2015   Procedure: IRRIGATION AND DEBRIDEMENT PERIRECTAL ABSCESS and debridement of scrotal abscess;  Surgeon: Florene Glen, MD;  Location: [facility] ORS;  Service: General;  Laterality: N/A;   INCISION AND DRAINAGE PERIRECTAL ABSCESS N/A 11/20/2015   Procedure: IRRIGATION AND DEBRIDEMENT PERIRECTAL ABSCESS / WITH DRESSING CHANGE;  Surgeon: Florene Glen, MD;  Location: [facility] ORS;  Service: General;  Laterality: N/A;  peri rectal site   INCISION AND DRAINAGE PERIRECTAL ABSCESS N/A 11/21/2015   Procedure: IRRIGATION AND DEBRIDEMENT PERIRECTAL ABSCESS WITH DRESSING CHANGE;  Surgeon: Florene Glen, MD;  Location: [facility] ORS;  Service: General;  Laterality: N/A;   MINOR HEMORRHOIDECTOMY  1989   HPI:  Pt is a 70 y.o. male with medical history significant for Alcohol abuse, hypertension, remote history of perirectal abscess in 2017, who presents emergency department for weakness and increased urination. Patient had an upper endoscopy in 05/25/2020 for intractable hiccups and per endoscopy report was found to have small hiatal hernia. It was noted on GI that patient was referred to specialist for the small hernia. General surgery referral on 07/28/2020 completed for diaphragmatic hernia without obstruction or  gangrene. Per chart, pt reports poor p.o. intake for the last 2 years. He reports that this poor p.o. intake is attribute it to his persistent and intractable hiccups. Per pt chart and daughter's report, pt able to consume alcohol daily. Chest X-Ray 04/28/20: No evidence of active cardiopulmonary disease. Pt is currently on a regular consistency and thin liquid diet    Assessment / Plan / Recommendation  Clinical Impression  Pt presents with grossly functional oropharyngeal swallow ability as indicated by lack of s/sx of aspiration and complete oral manipulation and clearance of regular solids. Pt/daughter endorse globus sensation with pill administration and complex solids (bread); however, no complaint with trials completed this date. Given presence of hiatal hernia/persistent globus sensation, follow up esophageal assessment indicated. Further, based on poor PO intake and pt/daughter report of weakness related to not eating, recommend dietician follow up. MD later placing DG esophagus order to further assess.   Education provided to pt/daughter regarding esophageal precautions (sitting upright for intake and remaining upright at completion of meals, selection of softer solids, and utilizing puree for carrier for pill administration). All reported education. Recommend continued regular solids and thin liquids with medication taken in puree. No further SLP services indicated at this time. RN aware of recommendations. SLP Visit Diagnosis: Dysphagia, unspecified (R13.10)    Aspiration Risk  Mild aspiration risk    Diet Recommendation     Medication Administration: Whole meds with puree    Other  Recommendations Recommended Consults: Consider esophageal assessment (dietician) Oral Care Recommendations: Oral care BID    Recommendations for follow up therapy are one component of a multi-disciplinary discharge planning process, led by the attending physician.  Recommendations may be updated based on  patient status,  additional functional criteria and insurance authorization.  Follow up Recommendations None      Frequency and Duration  (no follow up recommended)   (no follow up recommended)       Prognosis Barriers to Reach Goals:  (none)      Swallow Study   General Date of Onset: 12/26/20 HPI: Pt is a 70 y.o. male with medical history significant for Alcohol abuse, hypertension, remote history of perirectal abscess in 2017, who presents emergency department for weakness and increased urination. Patient had an upper endoscopy in 05/25/2020 for intractable hiccups and per endoscopy report was found to have small hiatal hernia. It was noted on GI that patient was referred to specialist for the small hernia. General surgery referral on 07/28/2020 completed for diaphragmatic hernia without obstruction or gangrene. Per chart, pt reports poor p.o. intake for the last 2 years. He reports that this poor p.o. intake is attribute it to his persistent and intractable hiccups. Chest X-Ray 04/28/20: No evidence of active cardiopulmonary disease. Pt is currently on a regular consistency and thin liquid diet Type of Study: Bedside Swallow Evaluation Previous Swallow Assessment: n/a Diet Prior to this Study: Regular;Thin liquids Temperature Spikes Noted: No (WBC 4.0 trending down) Respiratory Status: Room air History of Recent Intubation: No Behavior/Cognition: Alert;Cooperative Oral Cavity Assessment: Within Functional Limits Oral Care Completed by SLP: No Oral Cavity - Dentition: Adequate natural dentition Vision: Functional for self-feeding Patient Positioning: Partially reclined (deferrred upright positioning d/t pain) Baseline Vocal Quality: Normal Volitional Swallow: Able to elicit    Oral/Motor/Sensory Function Overall Oral Motor/Sensory Function: Within functional limits   Ice Chips Ice chips: Not tested   Thin Liquid Thin Liquid: Within functional limits Presentation: Straw    Nectar  Thick Nectar Thick Liquid: Not tested   Honey Thick Honey Thick Liquid: Not tested   Puree Puree: Within functional limits Presentation: Self Fed;Spoon   Solid   [place] Lan Mcneill  MS, CCC-SLP  Solid: Within functional limits      [place] 12/27/2020,1:26 PM

## 2020-12-27 NOTE — Progress Notes (Signed)
Physical Therapy Treatment Patient Details Name: John Booker MRN: [id number] DOB: 04-10-1950 Today's Date: 12/27/2020   History of Present Illness John Booker is a 70 y.o. male here with generalized weakness.  The patient states that over the last several days, he has had progressive worsening, generalized weakness.  He states that he has been essentially unable to get himself up out of his couch.  He has fallen twice in the last 24 hours.    PT Comments    Pt received laying supine in bed and agreeable to ambulation. Pt assisted with donning pants and able to demonstrate dynamic standing balance without UE assistance with SPV for safety. Pt ambulated 200 ft with cues for heel strike and increased step length on L to improve symmetry of gait. Pt continues to have B LE edema limiting his ankle ROM. Pt requesting to get back to bed rather than sit in the chair. Pt left with all needs within reach. Encouraged pt to get up with nursing staff and will continue to see patient during acute hospitalization to maintain strength and balance to reduce readmission and fall risk.     Recommendations for follow up therapy are one component of a multi-disciplinary discharge planning process, led by the attending physician.  Recommendations may be updated based on patient status, additional functional criteria and insurance authorization.  Follow Up Recommendations  Home health PT;Supervision for mobility/OOB     Equipment Recommendations  Rolling walker with 5" wheels    Recommendations for Other Services       Precautions / Restrictions Precautions Precautions: Fall Restrictions Weight Bearing Restrictions: No     Mobility  Bed Mobility Overal bed mobility: Modified Independent             General bed mobility comments: Able to manage B LE off EOB. Usage of bedrails for trunk management to sitting    Transfers Overall transfer level: Needs assistance Equipment used: Rolling  walker (2 wheeled) Transfers: Sit to/from Stand Sit to Stand: Supervision         General transfer comment: Able to stand from lowered bed surface with SPV and multiple rocking attempts. Pt placing B UEs on RW to complete after cues for pushing up from bec  Ambulation/Gait Ambulation/Gait assistance: Min guard Gait Distance (Feet): 200 Feet Assistive device: Rolling walker (2 wheeled) Gait Pattern/deviations: Step-through pattern;Decreased step length - left;Decreased dorsiflexion - right;Decreased dorsiflexion - left Gait velocity: decreased.   General Gait Details: Pt ambulated with RW with decreased shuffling of gait during trial. Pt reporting some fatigue after end of ambulation trial.   Stairs             Wheelchair Mobility    Modified Rankin (Stroke Patients Only)       Balance Overall balance assessment: Needs assistance Sitting-balance support: No upper extremity supported;Feet supported Sitting balance-Leahy Scale: Normal     Standing balance support: No upper extremity supported;During functional activity Standing balance-Leahy Scale: Fair Standing balance comment: Able to manage LE dressing in standing from knees without UE assistance requiring SPV for safety.                            Cognition Arousal/Alertness: Awake/alert Behavior During Therapy: Restless;Agitated Overall Cognitive Status: Within Functional Limits for tasks assessed  Exercises Total Joint Exercises Ankle Circles/Pumps: AROM;Both;20 reps;Seated Long Arc Quad: AROM;Strengthening;Both;20 reps;Seated Other Exercises Other Exercises: Donning of LE body dressing prior to ambulation. Pt able to manage pants from above the knee without UE support to work on dynamic standing balance.    General Comments        Pertinent Vitals/Pain Pain Assessment: No/denies pain    Home Living                       Prior Function            PT Goals (current goals can now be found in the care plan section) Acute Rehab PT Goals Patient Stated Goal: to go home PT Goal Formulation: With patient Time For Goal Achievement: 01/09/21 Potential to Achieve Goals: Good Progress towards PT goals: Progressing toward goals    Frequency    Min 2X/week      PT Plan Current plan remains appropriate    Co-evaluation              AM-PAC PT "6 Clicks" Mobility   Outcome Measure  Help needed turning from your back to your side while in a flat bed without using bedrails?: None Help needed moving from lying on your back to sitting on the side of a flat bed without using bedrails?: None Help needed moving to and from a bed to a chair (including a wheelchair)?: A Little Help needed standing up from a chair using your arms (e.g., wheelchair or bedside chair)?: A Little Help needed to walk in hospital room?: A Little Help needed climbing 3-5 steps with a railing? : A Lot 6 Click Score: 19    End of Session Equipment Utilized During Treatment: Gait belt Activity Tolerance: Patient tolerated treatment well Patient left: in bed;with call bell/phone within reach;with bed alarm set;with family/visitor present Nurse Communication: Mobility status PT Visit Diagnosis: Unsteadiness on feet (R26.81);Repeated falls (R29.6);Muscle weakness (generalized) (M62.81);History of falling (Z91.81);Other abnormalities of gait and mobility (R26.89);Difficulty in walking, not elsewhere classified (R26.2)     Time: 1045-1101 PT Time Calculation (min) (ACUTE ONLY): 16 min  Charges:  $Gait Training: 8-22 mins                     Andrey Campanile, SPT    Andrey Campanile 12/27/2020, 1:12 PM

## 2020-12-27 NOTE — TOC Initial Note (Signed)
Transition of Care [facility]) - Initial/Assessment Note    Patient Details  Name: John Booker MRN: [id number] Date of Birth: [birth year]  Transition [facility] Care [facility]) CM/SW Contact:    Pete Pelt, RN Phone Number: 12/27/2020, 4:22 PM  Clinical Narrative:  Patient lives at home alone, daughter works and has a family, but checks on patient as often as possible.  Nephew lives next door and is a Agricultural consultant and can check on patient when he is not working.   Until recent weakness, patient has had no concerns with transporting to appointments or to the pharmacy or taking medications.  Patient walked 200 feet, is eligible for HH.  Daughter asked for transportation resources, provided information for CJ transportation and suggested she contact [employer].     Home Health requests out, toc to follow up, contact information provided.                 Expected Discharge Plan: [place] Barriers to Discharge: Continued Medical Work up   Patient Goals and CMS Choice     Choice offered to / list presented to : NA  Expected Discharge Plan and Services Expected Discharge Plan: [place]   Discharge Planning Services: CM Consult   Living arrangements for the past 2 months: Single Family Home                 DME Arranged:  (TBD)         HH Arranged:  (TBD)          Prior Living Arrangements/Services Living arrangements for the past 2 months: Single Family Home Lives with:: Self Patient language and need for interpreter reviewed:: Yes (No interpreter required) Do you feel safe going back to the place where you live?: Yes      Need for Family Participation in Patient Care: Yes (Comment) Care giver support system in place?: Yes (comment) Current home services: DME (has a cane and a walker) Criminal Activity/Legal Involvement Pertinent to Current Situation/Hospitalization: No - Comment as needed  Activities of Daily Living Home Assistive  Devices/Equipment: None ADL Screening (condition at time of admission) Patient's cognitive ability adequate to safely complete daily activities?: Yes Is the patient deaf or have difficulty hearing?: No Does the patient have difficulty seeing, even when wearing glasses/contacts?: No Does the patient have difficulty concentrating, remembering, or making decisions?: No Patient able to express need for assistance with ADLs?: Yes Does the patient have difficulty dressing or bathing?: No Independently performs ADLs?: Yes (appropriate for developmental age) Does the patient have difficulty walking or climbing stairs?: No Weakness of Legs: None Weakness of Arms/Hands: None  Permission Sought/Granted Permission sought to share information with : Case Manager Permission granted to share information with : Yes, Verbal Permission Granted     Permission granted to share info w AGENCY: Prospective HH agencies        Emotional Assessment Appearance:: Appears stated age     Orientation: : Oriented to Self Alcohol / Substance Use: Not Applicable Psych Involvement: No (comment)  Admission diagnosis:  Dehydration [E86.0] Weakness [R53.1] Pitting edema [R60.9] Orthostasis [I95.1] Patient Active Problem List   Diagnosis Date Noted   Weakness 12/26/2020   COVID-19 vaccination declined 03/21/2020   Overweight (BMI 25.0-29.9) 12/20/2019   Blister of skin 11/07/2019   Neuropathy 04/04/2019   Pre-ulcerative calluses 10/07/2018   Skin lesion 10/07/2018   Plantar flexed metatarsal bone of right foot 10/07/2018   Hiatal hernia  Stomach irritation    Intractable hiccups 10/16/2017   Elevated serum creatinine 07/15/2017   Bilateral lower extremity edema 05/08/2017   Alcohol abuse 03/06/2017   Alcohol dependence in sustained full remission ([place]) 03/05/2017   Abnormal glucose 12/20/2015   Essential hypertension 11/15/2015   Vitamin D deficiency 11/15/2015   PCP:  Olin Hauser,  DO Pharmacy:   [place], [place] - [address] [place] [address] Phone: [phone] Fax: [phone]     Social Determinants of Health (SDOH) Interventions    Readmission Risk Interventions No flowsheet data found.

## 2020-12-27 NOTE — Consult Note (Signed)
Neurology Consultation Reason for Consult: Intractable hiccups Requesting Physician: Amy Cox  CC: Hiccups  History is obtained from: Patient, chart review and daughter at bedside   HPI: John Booker is a 70 y.o. male with a past medical history significant for hypertension, ongoing daily alcohol use, remote perirectal abscess (2017)  Per patient and chart review, these events began in late 2018 or early 2019 and were initially sporadic but more recently became continuous.  He has tried baclofen in the past but felt it was not helpful or did not like the side effects and stopped taking this medication.  Thorazine was additionally recommended but he was reluctant to try this medication.  He additionally tried a PPI after GI evaluation including endoscopy done in March 2020 with biopsy negative for H. pylori, inflammation or infection.  In addition to his hiccuping issues he has a longstanding history of intermittent diarrhea which he attributes to irritable bowel syndrome and notes that these issues run in his family.  He has also had some intermittent lower extremity edema which has been worse in the last week in the bilateral lower extremities.  Outside of episodes of hiccuping he does not have nausea or vomiting, but due to the frequent hiccuping he has had frequent vomiting which is led to a 20 to 40 pound weight loss per his report in the past 2 years (notes his weight had been 200 pounds at baseline, dropped to 180 although more recently has been 190).  On initial hospitalist evaluation in the ED he was having such severe continuous hiccups but it was not felt that he would tolerate imaging.  He did receive baclofen as well as Thorazine and has had full resolution of his hiccups at the time of my evaluation.  He reports he has eaten breakfast and lunch and tolerated these very well.  He was also started on high-dose thiamine.  Regarding his drinking history variable reports are given.  Family  reported daily drinking to admitting hospitalist, but to me the patient reports his last drink 1 or 2 weeks ago and he drinks 1 or 2 drinks while watching sports on TV when he drinks.  He additionally does report some pretty significant chronic neck pain which he prefers to manage through his chiropractor.  Per chart review, he was seen by his PCP in May 2019 at which time it was noted: "CHRONIC HICCUPS / BELCHING GAS - Prior history of persistent intractable hiccups, now for >8 months with recurrent flares and episodes and worsening. Seems to not resolve with any treatments or home remedy. He used to have long history of heavy alcohol consumption was alcohol free for 12 years then resumed drinking back in 2017 and 2018 then has been sober now since 03/2017 again. - He has episodes for days with hiccups sometimes only hours, seems to be sporadic no known trigger, sometimes he can go several days without episodes - Tried Baclofen PRN - very mild relief but not significant, he was cautious about sleepiness, didn't take regularly - Pharmacist recommended he ask his doctor about Chlorpromazine, but he does not want to take an anti-psychotic today - He requests 2nd opinion from gastroenterologist - Denies abdominal pain, nausea vomiting, fever chills, chest pain, dyspnea"   ROS: All other review of systems was negative except as noted in the HPI.   Past Medical History:  Diagnosis Date   Hypertension     Family History  Problem Relation Age of Onset   Hypertension Father  Social History:  reports that he has quit smoking. His smoking use included cigarettes. He has a 19.00 pack-year smoking history. He has quit using smokeless tobacco. He reports that he does not drink alcohol and does not use drugs.  However he is currently reporting occasionally drinking alcohol  Exam: Current vital signs: BP 115/60 (BP Location: Right Arm)   Pulse 87   Temp 97.8 F (36.6 C) (Oral)   Resp 16   Ht 6'  3" (1.905 m)   Wt 83.9 kg   SpO2 100%   BMI 23.12 kg/m  Vital signs in last 24 hours: Temp:  [97.6 F (36.4 C)-98.2 F (36.8 C)] 97.8 F (36.6 C) (10/20 1205) Pulse Rate:  [70-95] 87 (10/20 1205) Resp:  [15-18] 16 (10/20 1205) BP: (102-123)/(60-75) 115/60 (10/20 1205) SpO2:  [96 %-100 %] 100 % (10/20 1205)   Physical Exam  Constitutional: Appears well-developed and well-nourished.  BMI is in a healthy range.  Not having any hiccups. Psych: Affect flat and irritable but redirectable Eyes: No scleral injection HENT: No oropharyngeal obstruction.  MSK: no joint deformities.  Cardiovascular: Normal rate and regular rhythm.  Respiratory: Effort normal, non-labored breathing GI: Soft.  No distension. There is no tenderness.  Skin: Significant edema of the bilateral feet.  Unstaged ulcer of the right heel which he reports he has been seeing a podiatrist for  Neuro: Mental Status: Patient is awake, alert, oriented to person, place, month, year, and situation. Patient is able to give a clear and coherent history No signs of aphasia or neglect Cranial Nerves: II: Visual Fields are full. Pupils are equal, round, and reactive to light.   III,IV, VI: EOMI without ptosis or diploplia.  However he does have some direction changing nystagmus on lateral gaze V: Facial sensation is symmetric to temperature and light touch VII: Facial movement is symmetric.  VIII: hearing is intact to voice X: Uvula elevates symmetrically XI: Shoulder shrug is symmetric. XII: tongue is midline without atrophy or fasciculations.  Motor: Tone is normal. Bulk is normal. 5/5 strength was present in all four extremities.  Other than mild left wrist extension weakness 4+/5, as well as hip flexor weakness bilaterally, slightly worse in the left leg than the right leg (4+ on the right, 4 on the left).  He complains of significant neck pain with deltoid testing and in the setting is 4+/5 Sensory: Sensation is  symmetric to light touch and temperature in the arms and legs, and he reports no length dependent loss of temperature sensation.  Vibration sensation is absent at the toe and ankle on the right leg but present at the knee.  Left leg was not tested.  However he reports temperature sensation is intact in the legs.  Romberg is negative. Deep Tendon Reflexes: 3+ and symmetric in the biceps, brachioradialis, 2+ patellae, however he has 3 beats of clonus in the left ankle, 2 beats in the right.  Plantars: Toes are mute bilaterally. Cerebellar: FNF and HKS are intact bilaterally Gait: Slightly wide-based with a walker and slightly shuffling, but overall steady and with smooth turns.  He is able to rise on his heels and his toes using some support for balance.  Romberg as noted under sensory exam is negative   I have reviewed labs in epic and the results pertinent to this consultation are: Vitamin D at 14.56 Lab Results  Component Value Date   VITAMINB12 350 12/26/2020     Unresulted Labs (From admission, onward)  Start     Ordered   12/28/20 0500  Magnesium  Tomorrow morning,   R        12/27/20 0825   12/28/20 3790  Basic metabolic panel  Tomorrow morning,   R        12/27/20 0825   12/28/20 0500  CBC  Tomorrow morning,   R        12/27/20 0825   12/27/20 1033  Vitamin B6  Once,   R        12/27/20 1033   12/26/20 1547  Copper, serum  Once,   STAT        12/26/20 1606   12/26/20 1546  Vitamin A  Once,   STAT        12/26/20 1606   12/26/20 1546  Vitamin E  Once,   STAT        12/26/20 1606   12/26/20 1546  Vitamin K1, Serum  Once,   STAT        12/26/20 1606   12/26/20 1546  Zinc  Once,   STAT        12/26/20 1606   12/26/20 1545  Miscellaneous [facility] test (send-out)  Once,   STAT       Question:  Test name / description:  Answer:  B3 (niacin and metabolite), [facility] [id number] [email or web address]   12/26/20 1606    12/26/20 1420  Vitamin B1  Once,   R        12/26/20 1420             I have reviewed the images obtained:  Renal ultrasound Other: Diffuse increased echogenicity of the visualized portions of the hepatic parenchyma are a nonspecific indicator of hepatocellular dysfunction, most commonly steatosis. IMPRESSION: No significant sonographic abnormality of the kidneys.   Impression: Hiccups appear to be symptomatically well controlled on baclofen and Thorazine that the patient has received here.  He is very slightly bradykinetic on examination and has a slightly shuffling gait, as well as markedly masked facies, but he has not noticed any significant side effects from the Thorazine and he has had a marked improvement in his hiccups with ability to tolerate oral intake today.  Differential for hiccups is broad, and appreciate continued GI follow-up.  From a neurological perspective, brainstem lesions involving the periaqueductal gray area can be associated with chronic hiccups and I will therefore obtain an MRI of his brain with and without contrast given the progressive nature described by the patient.  In addition he is myelopathic on examination with clonus of the left ankle and some bilateral hip flexor weakness, for which MRI cervical spine will be obtained.  Overall suspect his feeling of generalized weakness is related to multiple vitamin deficiencies given his poor nutrition for some time; after some initial vitamin supplementation and a couple of good meals he does seem to have minimal weakness on examination at this time.  Treat for nutritional deficiencies as below.  Recommendations: -MRI brain and cervical spine with and without contrast -Vitamin D supplementation given low level, 50 K units weekly x8 weeks ordered, further follow-up per PCP -B12 low normal 350, goal greater than 500, so I will start empiric supplementation with 1000 mcg daily -Agree with folate and  multivitamin -Continue thiamine 500 mg every 8 hours ideally for 3-day course although can be replaced by oral thiamine 100 mg daily on discharge -Thorazine 12.5 mg every 6 hours as needed for hiccups should they recur -Follow-up pending nutritional  studies and treat as needed per primary team and PCP -Continue counseling on avoidance of alcohol -If MRI brain and cervical spine are negative for acutely intervene able pathology and patient remains symptomatically well controlled with tolerating p.o. intake, anticipate any further work-up can be completed on an outpatient basis.  Neurology will therefore follow-up for these studies but otherwise be available as needed going forward.  Lesleigh Noe MD-PhD [facility] [phone]

## 2020-12-28 ENCOUNTER — Observation Stay: Payer: Medicare Other | Admitting: Certified Registered"

## 2020-12-28 ENCOUNTER — Inpatient Hospital Stay: Payer: Medicare Other

## 2020-12-28 ENCOUNTER — Encounter
Admission: EM | Disposition: A | Discharge status: Home Health | Payer: Self-pay | Source: Home / Self Care | Attending: Internal Medicine

## 2020-12-28 ENCOUNTER — Encounter: Payer: Self-pay | Admitting: Internal Medicine

## 2020-12-28 DIAGNOSIS — R1110 Vomiting, unspecified: Secondary | ICD-10-CM | POA: Diagnosis present

## 2020-12-28 DIAGNOSIS — F17210 Nicotine dependence, cigarettes, uncomplicated: Secondary | ICD-10-CM | POA: Diagnosis not present

## 2020-12-28 DIAGNOSIS — Z20822 Contact with and (suspected) exposure to covid-19: Secondary | ICD-10-CM | POA: Diagnosis present

## 2020-12-28 DIAGNOSIS — E440 Moderate protein-calorie malnutrition: Secondary | ICD-10-CM | POA: Diagnosis not present

## 2020-12-28 DIAGNOSIS — R531 Weakness: Secondary | ICD-10-CM | POA: Diagnosis not present

## 2020-12-28 DIAGNOSIS — R519 Headache, unspecified: Secondary | ICD-10-CM | POA: Diagnosis not present

## 2020-12-28 DIAGNOSIS — K224 Dyskinesia of esophagus: Secondary | ICD-10-CM | POA: Diagnosis present

## 2020-12-28 DIAGNOSIS — E538 Deficiency of other specified B group vitamins: Secondary | ICD-10-CM | POA: Diagnosis present

## 2020-12-28 DIAGNOSIS — F1010 Alcohol abuse, uncomplicated: Secondary | ICD-10-CM | POA: Diagnosis present

## 2020-12-28 DIAGNOSIS — I10 Essential (primary) hypertension: Secondary | ICD-10-CM | POA: Diagnosis not present

## 2020-12-28 DIAGNOSIS — R509 Fever, unspecified: Secondary | ICD-10-CM | POA: Diagnosis not present

## 2020-12-28 DIAGNOSIS — I959 Hypotension, unspecified: Secondary | ICD-10-CM | POA: Diagnosis not present

## 2020-12-28 DIAGNOSIS — R066 Hiccough: Secondary | ICD-10-CM | POA: Diagnosis not present

## 2020-12-28 DIAGNOSIS — K3189 Other diseases of stomach and duodenum: Secondary | ICD-10-CM | POA: Diagnosis not present

## 2020-12-28 DIAGNOSIS — E871 Hypo-osmolality and hyponatremia: Secondary | ICD-10-CM | POA: Diagnosis present

## 2020-12-28 DIAGNOSIS — K2980 Duodenitis without bleeding: Secondary | ICD-10-CM | POA: Diagnosis not present

## 2020-12-28 DIAGNOSIS — K269 Duodenal ulcer, unspecified as acute or chronic, without hemorrhage or perforation: Secondary | ICD-10-CM | POA: Diagnosis not present

## 2020-12-28 DIAGNOSIS — J690 Pneumonitis due to inhalation of food and vomit: Secondary | ICD-10-CM | POA: Diagnosis present

## 2020-12-28 DIAGNOSIS — Z888 Allergy status to other drugs, medicaments and biological substances status: Secondary | ICD-10-CM | POA: Diagnosis not present

## 2020-12-28 DIAGNOSIS — K2970 Gastritis, unspecified, without bleeding: Secondary | ICD-10-CM | POA: Diagnosis not present

## 2020-12-28 DIAGNOSIS — R634 Abnormal weight loss: Secondary | ICD-10-CM | POA: Diagnosis not present

## 2020-12-28 DIAGNOSIS — G9520 Unspecified cord compression: Secondary | ICD-10-CM | POA: Diagnosis present

## 2020-12-28 DIAGNOSIS — M4712 Other spondylosis with myelopathy, cervical region: Secondary | ICD-10-CM | POA: Diagnosis not present

## 2020-12-28 DIAGNOSIS — M50222 Other cervical disc displacement at C5-C6 level: Secondary | ICD-10-CM | POA: Diagnosis present

## 2020-12-28 DIAGNOSIS — Z6825 Body mass index (BMI) 25.0-25.9, adult: Secondary | ICD-10-CM | POA: Diagnosis not present

## 2020-12-28 DIAGNOSIS — K2990 Gastroduodenitis, unspecified, without bleeding: Secondary | ICD-10-CM | POA: Diagnosis not present

## 2020-12-28 DIAGNOSIS — Y900 Blood alcohol level of less than 20 mg/100 ml: Secondary | ICD-10-CM | POA: Diagnosis present

## 2020-12-28 DIAGNOSIS — E860 Dehydration: Secondary | ICD-10-CM | POA: Diagnosis present

## 2020-12-28 DIAGNOSIS — E876 Hypokalemia: Secondary | ICD-10-CM | POA: Diagnosis present

## 2020-12-28 DIAGNOSIS — E559 Vitamin D deficiency, unspecified: Secondary | ICD-10-CM | POA: Diagnosis present

## 2020-12-28 DIAGNOSIS — J90 Pleural effusion, not elsewhere classified: Secondary | ICD-10-CM | POA: Diagnosis not present

## 2020-12-28 DIAGNOSIS — G8929 Other chronic pain: Secondary | ICD-10-CM | POA: Diagnosis present

## 2020-12-28 DIAGNOSIS — K7010 Alcoholic hepatitis without ascites: Secondary | ICD-10-CM | POA: Diagnosis present

## 2020-12-28 DIAGNOSIS — K449 Diaphragmatic hernia without obstruction or gangrene: Secondary | ICD-10-CM | POA: Diagnosis not present

## 2020-12-28 DIAGNOSIS — N179 Acute kidney failure, unspecified: Secondary | ICD-10-CM | POA: Diagnosis present

## 2020-12-28 DIAGNOSIS — M542 Cervicalgia: Secondary | ICD-10-CM | POA: Diagnosis not present

## 2020-12-28 HISTORY — PX: ESOPHAGOGASTRODUODENOSCOPY (EGD) WITH PROPOFOL: SHX5813

## 2020-12-28 LAB — MAGNESIUM: Magnesium: 1.7 mg/dL (ref 1.7–2.4)

## 2020-12-28 LAB — BASIC METABOLIC PANEL
Anion gap: 6 (ref 5–15)
BUN: 24 mg/dL — ABNORMAL HIGH (ref 8–23)
CO2: 26 mmol/L (ref 22–32)
Calcium: 8.1 mg/dL — ABNORMAL LOW (ref 8.9–10.3)
Chloride: 100 mmol/L (ref 98–111)
Creatinine, Ser: 1.13 mg/dL (ref 0.61–1.24)
GFR, Estimated: >60 mL/min (ref >60)
Glucose, Bld: 93 mg/dL (ref 70–99)
Potassium: 3.4 mmol/L — ABNORMAL LOW (ref 3.5–5.1)
Sodium: 132 mmol/L — ABNORMAL LOW (ref 135–145)

## 2020-12-28 LAB — CBC
HCT: 36.7 % — ABNORMAL LOW (ref 39.0–52.0)
Hemoglobin: 12.6 g/dL — ABNORMAL LOW (ref 13.0–17.0)
MCH: 34 pg (ref 26.0–34.0)
MCHC: 34.3 g/dL (ref 30.0–36.0)
MCV: 98.9 fL (ref 80.0–100.0)
Platelets: 120 10*3/uL — ABNORMAL LOW (ref 150–400)
RBC: 3.71 MIL/uL — ABNORMAL LOW (ref 4.22–5.81)
RDW: 15.3 % (ref 11.5–15.5)
WBC: 3.7 10*3/uL — ABNORMAL LOW (ref 4.0–10.5)
nRBC: 0 % (ref 0.0–0.2)

## 2020-12-28 LAB — VITAMIN B1: Vitamin B1 (Thiamine): 95 nmol/L (ref 66.5–200.0)

## 2020-12-28 SURGERY — ESOPHAGOGASTRODUODENOSCOPY (EGD) WITH PROPOFOL
Anesthesia: General

## 2020-12-28 MED ORDER — SODIUM CHLORIDE 0.9 % IV SOLN: INTRAVENOUS | Status: DC

## 2020-12-28 MED ORDER — PROPOFOL 500 MG/50ML IV EMUL
INTRAVENOUS | Status: DC | PRN
  Administered 2020-12-28: 120 ug/kg/min via INTRAVENOUS

## 2020-12-28 MED ORDER — LOSARTAN POTASSIUM 25 MG PO TABS
25.0000 mg | ORAL_TABLET | Freq: Every day | ORAL | Status: DC
  Administered 2020-12-28 – 2020-12-31 (×4): 25 mg via ORAL
  Filled 2020-12-28 (×4): qty 1

## 2020-12-28 MED ORDER — ENSURE ENLIVE PO LIQD
237.0000 mL | Freq: Three times a day (TID) | ORAL | Status: DC
  Administered 2020-12-28 – 2020-12-30 (×5): 237 mL via ORAL

## 2020-12-28 MED ORDER — PROPOFOL 10 MG/ML IV BOLUS
INTRAVENOUS | Status: DC | PRN
  Administered 2020-12-28: 20 mg via INTRAVENOUS
  Administered 2020-12-28: 50 mg via INTRAVENOUS

## 2020-12-28 NOTE — Progress Notes (Signed)
Initial Nutrition Assessment  DOCUMENTATION CODES:  Non-severe (moderate) malnutrition in context of chronic illness  INTERVENTION:  Obtain updated weight.  Advance diet as medically able and as tolerated per SLP.  Add Ensure Plus High Protein po TID, each supplement provides 350 kcal and 20 grams of protein.   Add Magic cup TID with meals, each supplement provides 290 kcal and 9 grams of protein.  Continue MVI with minerals daily.  Encourage PO and supplement intake.  NUTRITION DIAGNOSIS:  Moderate Malnutrition related to chronic illness (esophageal stricture) as evidenced by mild fat depletion, mild muscle depletion.  GOAL:  Patient will meet greater than or equal to 90% of their needs  MONITOR:  Diet advancement, PO intake, Supplement acceptance  REASON FOR ASSESSMENT:  Consult Assessment of nutrition requirement/status  ASSESSMENT:  70 yo male with a PMH of hypertension, ongoing daily alcohol use, remote perirectal abscess (2017) who presents with weakness. 10/20 - MBSS (recommends regular texture with thin liquids with meds in puree) 10/21 - EGD  Spoke with pt at bedside. Pt expressed frustration regarding not being able to have breakfast this morning, explained it was likely for his EGD.  Pt reports he has been feeling poorly for the last two years, struggling to consume solids. He reports a 25-30 lb weight loss in the past two years due to this.  Per Epic, pt has lost ~14 lbs (7%) in the past week, which is significant and severe. However, admission weight appears stated. RD to order measured weight to determine weight changes.  Medications: reviewed; folic acid, MVI with minerals, Vitamin B12, Vitamin D, NaCl @ 50 ml/hr  Labs: reviewed; Na 132 (L), K 3.4 (L), Vitamin D , 25-Hydroxy 14.56 (L)  NUTRITION - FOCUSED PHYSICAL EXAM: Flowsheet Row Most Recent Value  Orbital Region Mild depletion  Upper Arm Region Mild depletion  Thoracic and Lumbar Region Mild  depletion  Buccal Region Mild depletion  Temple Region Mild depletion  Clavicle Bone Region Mild depletion  Clavicle and Acromion Bone Region Mild depletion  Scapular Bone Region Mild depletion  Dorsal Hand Mild depletion  Patellar Region Mild depletion  Anterior Thigh Region Mild depletion  Posterior Calf Region Mild depletion  Edema (RD Assessment) Moderate  [BLE]  Hair Reviewed  Eyes Reviewed  Mouth Reviewed  Skin Reviewed  Nails Reviewed   Diet Order:   Diet Order             Diet NPO time specified  Diet effective now                  EDUCATION NEEDS:  Education needs have been addressed  Skin:  Skin Assessment: Reviewed RN Assessment  Last BM:  12/27/20  Height:  Ht Readings from Last 1 Encounters:  12/26/20 6\' 3"  (1.905 m)   Weight:  Wt Readings from Last 1 Encounters:  12/26/20 83.9 kg   BMI:  Body mass index is 23.12 kg/m.  Estimated Nutritional Needs:  Kcal:  2100-2300 Protein:  100-115 grams Fluid:  >2.1 L  Derrel Nip, RD, LDN (she/her/hers) Registered Dietitian I After-Hours/Weekend Pager # in [place]

## 2020-12-28 NOTE — Op Note (Signed)
[facility] Patient Name: John Booker Procedure Date: 12/28/2020 10:58 AM MRN: [id number] Account #: [id number] Date of Birth: April 19, 1950 Admit Type: Outpatient Age: 70 Room: [facility] ENDO ROOM 1 Gender: Male Note Status: Finalized Instrument Name: Upper Endoscope [id number] Procedure:             Upper GI endoscopy Indications:           Abnormal UGI series, Esophageal dysphagia Providers:             Annamaria Helling DO, DO Medicines:             Monitored Anesthesia Care Complications:         No immediate complications. Estimated blood loss:                         Minimal. Procedure:             Pre-Anesthesia Assessment:                        - Prior to the procedure, a History and Physical was                         performed, and patient medications and allergies were                         reviewed. The patient is competent. The risks and                         benefits of the procedure and the sedation options and                         risks were discussed with the patient. All questions                         were answered and informed consent was obtained.                         Patient identification and proposed procedure were                         verified by the physician, the nurse, the anesthetist                         and the technician in the endoscopy suite. Mental                         Status Examination: alert and oriented. Airway                         Examination: normal oropharyngeal airway and neck                         mobility. Respiratory Examination: clear to                         auscultation. CV Examination: RRR, no murmurs, no S3                         or S4. Prophylactic Antibiotics: The  patient does not                         require prophylactic antibiotics. Prior                         Anticoagulants: The patient has taken no previous                         anticoagulant or antiplatelet  agents. ASA Grade                         Assessment: III - A patient with severe systemic                         disease. After reviewing the risks and benefits, the                         patient was deemed in satisfactory condition to                         undergo the procedure. The anesthesia plan was to use                         monitored anesthesia care (MAC). Immediately prior to                         administration of medications, the patient was                         re-assessed for adequacy to receive sedatives. The                         heart rate, respiratory rate, oxygen saturations,                         blood pressure, adequacy of pulmonary ventilation, and                         response to care were monitored throughout the                         procedure. The physical status of the patient was                         re-assessed after the procedure.                        After obtaining informed consent, the endoscope was                         passed under direct vision. Throughout the procedure,                         the patient's blood pressure, pulse, and oxygen                         saturations were monitored continuously. The Endoscope  was introduced through the mouth, and advanced to the                         second part of duodenum. The upper GI endoscopy was                         accomplished without difficulty. The patient tolerated                         the procedure well. Findings:      Multiple diffuse erosions without bleeding were found in the duodenal       bulb, in the first portion of the duodenum and in the second portion of       the duodenum. Biopsies for histology were taken with a cold forceps for       evaluation of celiac disease. Estimated blood loss was minimal.      Scattered mild inflammation characterized by erosions and erythema was       found in the entire examined stomach. Biopsies were  taken with a cold       forceps for Helicobacter pylori testing. Estimated blood loss was       minimal.      A small hiatal hernia was present. Estimated blood loss: none.      No mass/lesion noted on retroflexion      The Z-line was regular and was found 40 cm from the incisors. Estimated       blood loss: none.      Esophagogastric landmarks were identified: the gastroesophageal junction       was found at 40 cm from the incisors.      Abnormal motility was noted in the esophagus. The cricopharyngeus was       normal. There is spasticity of the esophageal body. The distal       esophagus/lower esophageal sphincter is spastic, but gives up passage to       the endoscope. Entire esophagus is spastic. The LES has increased tone       and eventually gives way to scope. No stricture/mass/lesion noted. A       guidewire was placed and the scope was withdrawn. Dilation was performed       with a Savary dilator with no resistance at 51 Fr. No heme return on       dilator.      Normal mucosa was found in the entire esophagus. Impression:            - Duodenal erosions without bleeding. Biopsied.                        - Gastritis. Biopsied.                        - Small hiatal hernia.                        - Z-line regular, 40 cm from the incisors.                        - Esophagogastric landmarks identified.                        - Abnormal esophageal motility. Dilated.                        -  Normal mucosa was found in the entire esophagus. Recommendation:        - Return patient to hospital ward for ongoing care.                        - Resume previous diet and advance diet as tolerated.                        - Continue present medications.                        - Await pathology results.                        - Return to GI clinic at appointment to be scheduled                         with primary gastroenterologist                        - Recommend outpatient  manometry/motility study with                         his primary gastroenterologist                        - Recommend inpatient vs outpatient CT of                         chest/abdomen/pelvis given weight loss                        - Recommend outpatient colonoscopy given weight loss                         and time lapse between procedures                        - GI to sign off at this time. Available as needed Procedure Code(s):     --- Professional ---                        [phone], 51, Esophagogastroduodenoscopy, flexible,                         transoral; with insertion of guide wire followed by                         passage of dilator(s) through esophagus over guide wire                        [id number], 59, Esophagogastroduodenoscopy, flexible,                         transoral; with biopsy, single or multiple Diagnosis Code(s):     --- Professional ---                        K26.9, Duodenal ulcer, unspecified as acute or                         chronic,  without hemorrhage or perforation                        K29.70, Gastritis, unspecified, without bleeding                        K44.9, Diaphragmatic hernia without obstruction or                         gangrene                        K22.4, Dyskinesia of esophagus                        R13.14, Dysphagia, pharyngoesophageal phase                        R93.3, Abnormal findings on diagnostic imaging of                         other parts of digestive tract CPT copyright 2019 [employer]. All rights reserved. The codes documented in this report are preliminary and upon coder review may  be revised to meet current compliance requirements. Attending Participation:      I personally performed the entire procedure. Volney American, DO Annamaria Helling DO, DO 12/28/2020 11:41:35 AM This report has been signed electronically. Number of Addenda: 0 Note Initiated On: 12/28/2020 10:58 AM Estimated Blood Loss:   Estimated blood loss was minimal.      [facility]

## 2020-12-28 NOTE — Progress Notes (Signed)
Post Endoscopy report- please see epic result for EGD for all results: Recommendations - Resume previous diet and advance diet as tolerated.  - Continue present medications.  - Await pathology results.  - Return to GI clinic at appointment to be scheduled with primary gastroenterologist ([place] [facility]) - Recommend outpatient manometry/motility study with his primary gastroenterologist - Recommend inpatient vs outpatient CT of chest/abdomen/pelvis given weight loss - Recommend outpatient colonoscopy given weight loss and time lapse since last colonoscopy - these findings were communicated to patient's daughter over the phone and to the patient in recovery - an opportunity for questions was given and appropriate answers provided. They Verbalized understanding and were appreciative of the phone call. - Plan communicated with hospitalist team  GI to sign off. Available as needed. Please do not hesitate to call regarding questions or concerns.  Annamaria Helling, DO [facility]

## 2020-12-28 NOTE — Anesthesia Preprocedure Evaluation (Signed)
Anesthesia Evaluation  Patient identified by MRN, date of birth, ID band Patient awake    Reviewed: Allergy & Precautions, NPO status , Patient's Chart, lab work & pertinent test results  Airway Mallampati: II  TM Distance: >3 FB Neck ROM: full    Dental  (+) Missing   Pulmonary neg pulmonary ROS, COPD, Patient abstained from smoking., former smoker,    Pulmonary exam normal  + decreased breath sounds      Cardiovascular Exercise Tolerance: Poor hypertension, Pt. on medications negative cardio ROS Normal cardiovascular exam Rhythm:Regular Rate:Normal     Neuro/Psych negative neurological ROS  negative psych ROS   GI/Hepatic negative GI ROS, Neg liver ROS, hiatal hernia,   Endo/Other  negative endocrine ROS  Renal/GU negative Renal ROS  negative genitourinary   Musculoskeletal negative musculoskeletal ROS (+)   Abdominal Normal abdominal exam  (+)   Peds negative pediatric ROS (+)  Hematology negative hematology ROS (+)   Anesthesia Other Findings Past Medical History: No date: Hypertension  Past Surgical History: 05/26/2018: ESOPHAGOGASTRODUODENOSCOPY (EGD) WITH PROPOFOL; N/A     Comment:  Procedure: ESOPHAGOGASTRODUODENOSCOPY (EGD) WITH               PROPOFOL;  Surgeon: Virgel Manifold, MD;  Location:               [facility] ENDOSCOPY;  Service: Endoscopy;  Laterality: N/A; 11/19/2015: INCISION AND DRAINAGE PERIRECTAL ABSCESS; N/A     Comment:  Procedure: IRRIGATION AND DEBRIDEMENT PERIRECTAL ABSCESS              and debridement of scrotal abscess;  Surgeon: Florene Glen, MD;  Location: [facility] ORS;  Service: General;                Laterality: N/A; 11/20/2015: INCISION AND DRAINAGE PERIRECTAL ABSCESS; N/A     Comment:  Procedure: IRRIGATION AND DEBRIDEMENT PERIRECTAL ABSCESS              / WITH DRESSING CHANGE;  Surgeon: Florene Glen, MD;                Location: [facility] ORS;  Service:  General;  Laterality: N/A;               peri rectal site 11/21/2015: INCISION AND DRAINAGE PERIRECTAL ABSCESS; N/A     Comment:  Procedure: IRRIGATION AND DEBRIDEMENT PERIRECTAL ABSCESS              WITH DRESSING CHANGE;  Surgeon: Florene Glen, MD;                Location: [facility] ORS;  Service: General;  Laterality: N/A; 1989: MINOR HEMORRHOIDECTOMY  BMI    Body Mass Index: 23.12 kg/m      Reproductive/Obstetrics negative OB ROS                             Anesthesia Physical Anesthesia Plan  ASA: 3 and emergent  Anesthesia Plan: General   Post-op Pain Management:    Induction: Intravenous  PONV Risk Score and Plan: Propofol infusion and TIVA  Airway Management Planned: Nasal Cannula  Additional Equipment:   Intra-op Plan:   Post-operative Plan:   Informed Consent: I have reviewed the patients History and Physical, chart, labs and discussed the procedure including the risks, benefits and alternatives for the proposed anesthesia with the patient or  authorized representative who has indicated his/her understanding and acceptance.     Dental Advisory Given  Plan Discussed with: CRNA and Surgeon  Anesthesia Plan Comments:         Anesthesia Quick Evaluation

## 2020-12-28 NOTE — H&P (View-Only) (Signed)
[facility] Gastroenterology Inpatient Consultation   Patient ID: John Booker is a 70 y.o. male.  Requesting Provider: Nicole Kindred, DO  Date of Admission: 12/26/2020  Date of Consult: 12/28/20   Reason for Consultation: abnormal barium swallow   Patient's Chief Complaint:   Chief Complaint  Patient presents with   Weakness   Fall    70 year old Caucasian male with history of alcohol abuse, hypertension who presents for generalized weakness, chronic intractable hiccups, and weight loss.  Patient seen resting in bed comfortably on Thorazine drip, still with hiccups.  Patient has been evaluated in the past for his hiccups in 2020 including seeing gastroenterology and general surgery.  He continues to have issues and notes 20 pound weight loss in 2 years time.  He has a small hiatal hernia noted on EGD in March 2020 and was sent to general surgery who recommended medical management at that time.  Barium swallow performed during his hospitalization demonstrating esophageal dysmotility and spasm.  It was also noted that his lower esophagus did not relax and open appropriately raising concern for obstructive mass.  Patient notes suprasternal sticking when swallowing solids liquids and pills.  He denies odynophagia.  He notes nausea and vomiting.  Patient denies alcohol, but his daughter notes that he drinks 12 to 16 ounces of bourbon per day per notation.  Denies any tobacco use.  John Booker is a 70 y.o. male with medical history significant for Alcohol abuse, hypertension, remote history of perirectal abscess in 2017, who presents emergency department for weakness and increased urination.   He reports the weakness has been gone going and worse recently.  He endorses poor p.o. intake for the last 2 years.  He reports that this poor p.o. intake is attribute it to his persistent and intractable hiccups.   AST ALT ratio on presentation consistent with alcohol abuse AST 98 ALT 27.  Mild  hyperbilirubinemia at 1.8 which is also consistent with alcohol abuse.  Patient reports no historic response to baclofen.  Bowel movements are loose but no blood or melena.  No jaundice or pruritus.  Denies NSAIDs, Anti-plt agents, and anticoagulants Denies family history of gastrointestinal disease and malignancy Previous Endoscopies: 05/2018 EGD- small hiatal hernia; biopsies negative for BE and HP   Past Medical History:  Diagnosis Date   Hypertension     Past Surgical History:  Procedure Laterality Date   ESOPHAGOGASTRODUODENOSCOPY (EGD) WITH PROPOFOL N/A 05/26/2018   Procedure: ESOPHAGOGASTRODUODENOSCOPY (EGD) WITH PROPOFOL;  Surgeon: Virgel Manifold, MD;  Location: [facility] ENDOSCOPY;  Service: Endoscopy;  Laterality: N/A;   INCISION AND DRAINAGE PERIRECTAL ABSCESS N/A 11/19/2015   Procedure: IRRIGATION AND DEBRIDEMENT PERIRECTAL ABSCESS and debridement of scrotal abscess;  Surgeon: Florene Glen, MD;  Location: [facility] ORS;  Service: General;  Laterality: N/A;   INCISION AND DRAINAGE PERIRECTAL ABSCESS N/A 11/20/2015   Procedure: IRRIGATION AND DEBRIDEMENT PERIRECTAL ABSCESS / WITH DRESSING CHANGE;  Surgeon: Florene Glen, MD;  Location: [facility] ORS;  Service: General;  Laterality: N/A;  peri rectal site   INCISION AND DRAINAGE PERIRECTAL ABSCESS N/A 11/21/2015   Procedure: IRRIGATION AND DEBRIDEMENT PERIRECTAL ABSCESS WITH DRESSING CHANGE;  Surgeon: Florene Glen, MD;  Location: [facility] ORS;  Service: General;  Laterality: N/A;   MINOR HEMORRHOIDECTOMY  1989    Allergies  Allergen Reactions   Adalat [Nifedipine]     Foot swelling.    Amlodipine Other (See Comments)    Leg swelling    Family History  Problem Relation Age  of Onset   Hypertension Father     Social History   Tobacco Use   Smoking status: Former    Packs/day: 0.50    Years: 38.00    Pack years: 19.00    Types: Cigarettes   Smokeless tobacco: Former  Scientific laboratory technician Use: Never used  Substance  Use Topics   Alcohol use: No   Drug use: No     Pertinent GI related history and allergies were reviewed with the patient  Review of Systems  Constitutional:  Positive for appetite change (diminished) and unexpected weight change (lost 20 lbs in 2 years). Negative for activity change, chills, fatigue and fever.  HENT:  Positive for trouble swallowing. Negative for voice change.   Respiratory:  Negative for shortness of breath and wheezing.   Cardiovascular:  Negative for chest pain and palpitations.  Gastrointestinal:  Positive for diarrhea, nausea and vomiting. Negative for abdominal distention, abdominal pain, anal bleeding, blood in stool and constipation.       Intractable hiccups  Musculoskeletal:  Negative for arthralgias and myalgias.  Skin:  Negative for color change and pallor.  Neurological:  Negative for dizziness, syncope and weakness.  Psychiatric/Behavioral:  Negative for confusion. The patient is not nervous/anxious.   All other systems reviewed and are negative.   Medications Home Medications No current facility-administered medications on file prior to encounter.   Current Outpatient Medications on File Prior to Encounter  Medication Sig Dispense Refill   baclofen (LIORESAL) 10 MG tablet Take 1 tablet (10 mg total) by mouth 2 (two) times daily. 180 each 3   hydrochlorothiazide (HYDRODIURIL) 25 MG tablet Take 1 tablet (25 mg total) by mouth daily. 90 tablet 3   lisinopril (ZESTRIL) 40 MG tablet Take 1 tablet (40 mg total) by mouth daily. 90 tablet 3   sildenafil (REVATIO) 20 MG tablet Take 1-5 pills about 30 min prior to sex. Start with 1 and increase as needed. 30 tablet 3   Pertinent GI related medications were reviewed with the patient  Inpatient Medications  Current Facility-Administered Medications:    0.9 %  sodium chloride infusion, , Intravenous, Continuous, Ezekiel Slocumb, DO, Last Rate: 50 mL/hr at 12/28/20 0901, Rate Change at 12/28/20 0901    acetaminophen (TYLENOL) tablet 650 mg, 650 mg, Oral, Q6H PRN **OR** acetaminophen (TYLENOL) suppository 650 mg, 650 mg, Rectal, Q6H PRN, Cox, Amy N, DO   baclofen (LIORESAL) tablet 10 mg, 10 mg, Oral, BID, Cox, Amy N, DO, 10 mg at 12/27/20 2127   chlorproMAZINE (THORAZINE) 12.5 mg in sodium chloride 0.9 % 25 mL IVPB, 12.5 mg, Intravenous, Q6H PRN, Bhagat, Srishti L, MD   enoxaparin (LOVENOX) injection 40 mg, 40 mg, Subcutaneous, Q24H, Cox, Amy N, DO, 40 mg at 76/16/07 3710   folic acid (FOLVITE) tablet 1 mg, 1 mg, Oral, Daily, Cox, Amy N, DO, 1 mg at 12/27/20 0947   LORazepam (ATIVAN) tablet 1-4 mg, 1-4 mg, Oral, Q1H PRN, 1 mg at 12/26/20 2345 **OR** LORazepam (ATIVAN) injection 1-4 mg, 1-4 mg, Intravenous, Q1H PRN, Cox, Amy N, DO   multivitamin with minerals tablet 1 tablet, 1 tablet, Oral, Daily, Cox, Amy N, DO, 1 tablet at 12/27/20 0947   ondansetron (ZOFRAN) tablet 4 mg, 4 mg, Oral, Q6H PRN **OR** ondansetron (ZOFRAN) injection 4 mg, 4 mg, Intravenous, Q6H PRN, Cox, Amy N, DO   saccharomyces boulardii (FLORASTOR) capsule 250 mg, 250 mg, Oral, BID, Sharion Settler, NP, 250 mg at 12/27/20 2127   vitamin  B-12 (CYANOCOBALAMIN) tablet 1,000 mcg, 1,000 mcg, Oral, Daily, Bhagat, Srishti L, MD   Vitamin D (Ergocalciferol) (DRISDOL) capsule 50,000 Units, 50,000 Units, Oral, Q7 days, Bhagat, Srishti L, MD, 50,000 Units at 12/27/20 2127  sodium chloride 50 mL/hr at 12/28/20 0901   chlorproMAZINE (THORAZINE) IV      acetaminophen **OR** acetaminophen, chlorproMAZINE (THORAZINE) IV, LORazepam **OR** LORazepam, ondansetron **OR** ondansetron (ZOFRAN) IV   Objective   Vitals:   12/27/20 2053 12/28/20 0046 12/28/20 0423 12/28/20 0720  BP: 128/64 126/78 (!) 149/98 (!) 152/93  Pulse: 90 81 80 71  Resp: 18 16 18 16   Temp: 98.5 F (36.9 C) 98.1 F (36.7 C) 98.1 F (36.7 C) 97.8 F (36.6 C)  TempSrc: Oral   Oral  SpO2: 100% 99% 100% 100%  Weight:      Height:         Physical Exam Vitals and  nursing note reviewed.  Constitutional:      General: He is not in acute distress.    Appearance: Normal appearance. He is not ill-appearing, toxic-appearing or diaphoretic.     Comments: Continued hiccups  HENT:     Head: Normocephalic and atraumatic.     Nose: Nose normal.     Mouth/Throat:     Mouth: Mucous membranes are moist.     Pharynx: Oropharynx is clear.  Eyes:     General: No scleral icterus.    Extraocular Movements: Extraocular movements intact.  Cardiovascular:     Rate and Rhythm: Normal rate and regular rhythm.     Heart sounds: Normal heart sounds. No murmur heard.   No friction rub. No gallop.  Pulmonary:     Effort: Pulmonary effort is normal. No respiratory distress.     Breath sounds: Normal breath sounds. No wheezing, rhonchi or rales.  Abdominal:     General: Bowel sounds are normal. There is no distension.     Palpations: Abdomen is soft.     Tenderness: There is no abdominal tenderness. There is no guarding or rebound.     Comments: No rigidity.  Nonperitoneal  Musculoskeletal:     Cervical back: Neck supple.     Right lower leg: No edema.     Left lower leg: No edema.  Skin:    General: Skin is warm and dry.     Coloration: Skin is not jaundiced or pale.     Comments: Telangiectasias noted about the face  Neurological:     General: No focal deficit present.     Mental Status: He is alert and oriented to person, place, and time. Mental status is at baseline.  Psychiatric:        Mood and Affect: Mood normal.        Behavior: Behavior normal.        Thought Content: Thought content normal.        Judgment: Judgment normal.    Laboratory Data Recent Labs  Lab 12/26/20 0819 12/27/20 0452 12/28/20 0239  WBC 7.5 4.0 3.7*  HGB 15.9 13.9 12.6*  HCT 46.9 40.2 36.7*  PLT 157 129* 120*   Recent Labs  Lab 12/26/20 0819 12/27/20 0452 12/28/20 0239  NA 129* 132* 132*  K 4.1 3.3* 3.4*  CL 91* 97* 100  CO2 26 27 26   BUN 22 23 24*  CALCIUM 8.9  8.4* 8.1*  PROT 7.0  --   --   BILITOT 1.8*  --   --   ALKPHOS 78  --   --  ALT 27  --   --   AST 98*  --   --   GLUCOSE 103* 85 93   No results for input(s): INR in the last 168 hours.  Recent Labs    12/26/20 0819  LIPASE 26        Imaging Studies: DG Chest 2 View  Result Date: 12/26/2020 CLINICAL DATA:  Weakness. EXAM: CHEST - 2 VIEW COMPARISON:  Prior chest radiographs 03/03/2017 and earlier. FINDINGS: Heart size within normal limits. No appreciable airspace consolidation. No evidence of pleural effusion or pneumothorax. No acute bony abnormality identified. IMPRESSION: No evidence of active cardiopulmonary disease. Electronically Signed   By: Kellie Simmering D.O.   On: 12/26/2020 11:23   US RENAL  Result Date: 12/27/2020 CLINICAL DATA:  Acute kidney injury EXAM: RENAL / URINARY TRACT ULTRASOUND COMPLETE COMPARISON:  None. FINDINGS: Right Kidney: Renal measurements: 10.5 x 4.8 x 4.3 cm = volume: 114 mL. Echogenicity within normal limits. No mass or hydronephrosis visualized. Left Kidney: Renal measurements: 10.6 x 5.4 x 4.8 cm = volume: 142 mL. Echogenicity within normal limits. No mass or hydronephrosis visualized. Bladder: Unable to evaluate due to decompression. Other: Diffuse increased echogenicity of the visualized portions of the hepatic parenchyma are a nonspecific indicator of hepatocellular dysfunction, most commonly steatosis. IMPRESSION: No significant sonographic abnormality of the kidneys. Electronically Signed   By: Miachel Roux M.D.   On: 12/27/2020 16:41   DG ESOPHAGUS W SINGLE CM (SOL OR THIN BA)  Result Date: 12/27/2020 CLINICAL DATA:  Regurgitation of food EXAM: ESOPHAGUS/BARIUM SWALLOW/TABLET STUDY TECHNIQUE: Combined double and single contrast examination was performed using effervescent crystals, high-density barium, and thin liquid barium. FLUOROSCOPY TIME:  Radiation Exposure Index (as provided by the fluoroscopic device): 3.4 minutes If the device does not  provide the exposure index: Fluoroscopy Time:  59.8 mGy Number of Acquired Images:  5 COMPARISON:  NONE. FINDINGS: The patient could not stand up straight, roll up onto his side, or go into the prone position. Images were obtained in the AP and RPO projection. Findings are made within these confines. Swallowing: The patient swallowed thick barium without difficulty. Pharynx: Unremarkable. Esophagus: The esophagus was moderately dilated. No fixed stricture or mucosal lesion was identified. Esophageal motility: There is marked esophageal dysmotility with transient appearance of esophageal spasm. The lower esophageal sphincter transiently opened a small amount one single time, but was not seen to clearly open and relax despite several minutes of waiting with intermittent fluoro. Hiatal Hernia: None. Gastroesophageal reflux: None visualized. Provocative maneuvers were not performed. Ingested 39mm barium tablet: A tablet was not given as held up contrast was obscuring the GE junction. Other: None. IMPRESSION: 1. The lower esophageal frank tear was not seen to clearly relax and open during the study causing significant holdup of barium in the lower esophagus. This raises suspicion for an obstructing mass at the GE junction or possibly achalasia, though the typical bird's beak appearance of achalasia is not seen. Repeat study on an outpatient basis when the patient can tolerate different positions and standing upright may be useful for further evaluation. Endoscopy is also recommended for direct visualization of the GE junction. 2. Esophageal dysmotility with an appearance suggestive of intermittent esophageal spasm. Electronically Signed   By: Valetta Mole M.D.   On: 12/27/2020 16:40    Assessment:   # Abnormal Barium swallow - abnormal LES relaxation raising concern for obstruction  # intractable hiccups - has undergone GI and surgical evaluation in the past (2020)  with no etiology noted - currently on  thorazine, has not responded to baclofen in the past  # dysphagia- solids, liquids, pills - suprasternal sticking  # Alcohol abuse with alcoholic hepatitis - AST:ALT >2:1  # Hiatal hernia - small noted in 05/2018 egd  # electrolyte abnormalities - hyponatremia, hypokalemia, hypomagnesemia  # generalized weakness  # weight loss- 20+ lbs in 2  years - albumin 3.7  Plan:  Pt npo since midnight, lovenox given last night Plan for egd today for evaluation If patient has not undergone CT of chest/abdomen - recommend he does this to exclude any tumor causing issue to phrenic nerve. This can be done on outpatient vs inpatient basis- defer to primary team Further recommendations pending endoscopy- please see report once complete Continue ppi Continue thorazine; gabapentin and reglan may be of consideration if other medications do not work Correction of electrolyte abnormalities as per primary team Enteral feeding tube is not indicated in this patient at this time Recommend complete alcohol cessation  Esophagogastroduodenoscopy with possible biopsy, control of bleeding, polypectomy, and interventions as necessary has been discussed with the patient/patient representative. Informed consent was obtained from the patient/patient representative after explaining the indication, nature, and risks of the procedure including but not limited to death, bleeding, perforation, missed neoplasm/lesions, cardiorespiratory compromise, and reaction to medications. Opportunity for questions was given and appropriate answers were provided. Patient/patient representative has verbalized understanding is amenable to undergoing the procedure.  I personally performed the service.  Management of other medical comorbidities as per primary team  Thank you for allowing [place] to participate in this patient's care. Please don't hesitate to call if any questions or concerns arise.   Annamaria Helling, DO [facility]  Gastroenterology  Portions of the record may have been created with voice recognition software. Occasional wrong-word or 'sound-a-like' substitutions may have occurred due to the inherent limitations of voice recognition software.  Read the chart carefully and recognize, using context, where substitutions may have occurred.

## 2020-12-28 NOTE — Progress Notes (Signed)
PROGRESS NOTE    John Booker   DUK:[id number]  DOB: 20-Jan-1951  PCP: Olin Hauser, DO    DOA: 12/26/2020 LOS: 0    Brief Narrative / Hospital Course to Date:   70 y.o. male with medical history significant for Alcohol abuse, hypertension, remote history of perirectal abscess in 2017, who presents emergency department for profound generalized weakness and increased urination.  Weakness has been slowly progressive in the setting of inadequate p.o. intake due to intractable hiccups and regurgitating of most solid foods.  He does okay with liquids or very soft foods such as ice cream or applesauce.  Prior EGD 05/25/2020 obtained for intractable hiccups, showed a small hiatal hernia.  Of note, patient's family report he drinks 12 to 16 ounces of bourbon daily, lives alone.  Assessment & Plan   Principal Problem:   Weakness Active Problems:   Essential hypertension   Alcohol abuse   Intractable hiccups   COVID-19 vaccination declined   Malnutrition of moderate degree   Esophageal dysmotility   Generalized weakness due to Inadequate oral intake Regurgitation of solid foods due to Esophageal Dysmotility Intractable hiccups Hiatal hernia Given Thorazine IV on admission per recommendation by neurology.  Responds well to combination of baclofen and thorazine.  Barium swallow study showed --  The lower esophageal sphincter was not seen to clearly relax and open during the study causing significant holdup of barium in the lower esophagus. This raises suspicion for an obstructing mass at the GE junction or possibly achalasia, though the typical bird's beak appearance of achalasia is not seen. Repeat study on an outpatient basis when the patient can tolerate different positions and standing upright may be useful for further evaluation. Endoscopy is also recommended for direct visualization of the GE junction Esophageal dysmotility with an appearance suggestive of intermittent  esophageal spasm.   10/21: recurrent hiccups; EGD today PLAN -- --SLP consulted for swallow evaluation --GI consulted for EGD evaluation --NPO for EGD today --Modified barium swallow studies pending --Neurology consulted, appreciate input regarding intractable hiccups.  Patient declined MRI brain and C-spine as recommended --Dietitian consult --Stop IV fluids and monitor PO hydration / intake   Electrolyte abnormalities: Hypokalemia, hypomagnesemia, hyponatremia 10/20: K3.3, Mg 1.5, sodium 132.   10/21: K 3.4, Mg 1.7, Na 132 - replacing K --Monitor and replace as needed --Encourage PO intake / hydration  AKI -present on admission with creatinine 1.45, compared to baseline 1.15 on 12/20/2020. Suspect prerenal azotemia due to poor p.o. intake and dehydration. -- Resume maintenance IV fluids given persistent intolerance to p.o. intake -- Follow BMP -- Renal ultrasound unremarkable   Alcohol use disorder -Per daughter, patient consumes about 12 to 16 ounces of bourbon daily. --CIWA protocol with as needed Ativan --IV thiamine, PO thiamine at d/c --Monitor and replace electrolytes --Consider maintenance medication to reduce alcohol cravings in outpatient setting --TOC consult to provide resources to assist with cessation  Vitamin D deficiency - started on supplementation --PCP follow up  Vitamin B12 deficiency - started on supplementation --PCP follow up  Essential hypertension -currently with soft blood pressure, sewed of hypotension. Take lisinopril and HCTZ at home. --Hold antihypertensives for now --Monitor BP and resume therapy and when indicated  Continue home baclofen -indication unclear but possibly started for intractable hiccups  Patient BMI: Body mass index is 23.12 kg/m.   DVT prophylaxis: enoxaparin (LOVENOX) injection 40 mg Start: 12/26/20 2200 Place TED hose Start: 12/26/20 1355   Diet:  Diet Orders (From admission, onward)  Start     Ordered    12/28/20 1151  Diet full liquid Room service appropriate? Yes; Fluid consistency: Thin  Diet effective now       Question Answer Comment  Room service appropriate? Yes   Fluid consistency: Thin      12/28/20 1151              Code Status: Full Code   Subjective 12/28/20    Patient seen prior to EGD today.  Daughter seen in room later while patient at procedure, updated.  Patient having hiccups again this AM and waiting for thorazine.  He denies other acute complaints.  Poor sleep overnight.  Had not eaten breakfast.  Hopes to go home soon.   Disposition Plan & Communication   Status is: Inpatient  Remains inpatient appropriate because: ongoing evaluation.  Anticipate d/c tomorrow if PO intake and hydration adequate of IV fluids.   Family Communication: Daughter at bedside today, 10/21   Consults, Procedures, Significant Events   Consultants:  Neurology  Procedures:  Barium swallow study pending  Antimicrobials:  Anti-infectives (From admission, onward)    None         Micro    Objective   Vitals:   12/28/20 1156 12/28/20 1206 12/28/20 1355 12/28/20 1539  BP: 132/65 (!) 145/84 137/68 (!) 146/111  Pulse: 79 75 87 (!) 114  Resp: (!) 21 20 20 16   Temp:   (!) 97.5 F (36.4 C) 98.2 F (36.8 C)  TempSrc:    Oral  SpO2: 94% 94%  92%  Weight:      Height:        Intake/Output Summary (Last 24 hours) at 12/28/2020 1637 Last data filed at 12/28/2020 1129 Gross per 24 hour  Intake 1611.56 ml  Output 250 ml  Net 1361.56 ml   Filed Weights   12/26/20 0812  Weight: 83.9 kg    Physical Exam:  General exam: awake, alert, no acute distress, chronically ill-appearing. Frequent hiccups Respiratory system: normal respiratory effort, on room air Cardiovascular system: normal S1/S2, RRR, bilateral pedal edema appears stable. Gastrointestinal system: soft, NT, ND, no HSM felt, +bowel sounds. Central nervous system: A&O x3. no gross focal neurologic deficits,  normal speech  Labs   Data Reviewed: I have personally reviewed following labs and imaging studies  CBC: Recent Labs  Lab 12/26/20 0819 12/27/20 0452 12/28/20 0239  WBC 7.5 4.0 3.7*  HGB 15.9 13.9 12.6*  HCT 46.9 40.2 36.7*  MCV 98.7 100.8* 98.9  PLT 157 129* 347*   Basic Metabolic Panel: Recent Labs  Lab 12/26/20 0819 12/26/20 1155 12/26/20 1629 12/27/20 0452 12/28/20 0239  NA 129*  --   --  132* 132*  K 4.1  --   --  3.3* 3.4*  CL 91*  --   --  97* 100  CO2 26  --   --  27 26  GLUCOSE 103*  --   --  85 93  BUN 22  --   --  23 24*  CREATININE 1.45*  --   --  1.52* 1.13  CALCIUM 8.9  --   --  8.4* 8.1*  MG  --   --  1.5*  --  1.7  PHOS  --  2.5  --   --   --    GFR: Estimated Creatinine Clearance: 71.2 mL/min (by C-G formula based on SCr of 1.13 mg/dL). Liver Function Tests: Recent Labs  Lab 12/26/20 0819  AST 98*  ALT  27  ALKPHOS 78  BILITOT 1.8*  PROT 7.0  ALBUMIN 3.7   Recent Labs  Lab 12/26/20 0819  LIPASE 26   No results for input(s): AMMONIA in the last 168 hours. Coagulation Profile: No results for input(s): INR, PROTIME in the last 168 hours. Cardiac Enzymes: No results for input(s): CKTOTAL, CKMB, CKMBINDEX, TROPONINI in the last 168 hours. BNP (last 3 results) No results for input(s): PROBNP in the last 8760 hours. HbA1C: No results for input(s): HGBA1C in the last 72 hours. CBG: No results for input(s): GLUCAP in the last 168 hours. Lipid Profile: No results for input(s): CHOL, HDL, LDLCALC, TRIG, CHOLHDL, LDLDIRECT in the last 72 hours. Thyroid Function Tests: Recent Labs    12/26/20 1155  TSH 3.100   Anemia Panel: Recent Labs    12/26/20 1527  VITAMINB12 350   Sepsis Labs: No results for input(s): PROCALCITON, LATICACIDVEN in the last 168 hours.  Recent Results (from the past 240 hour(s))  Resp Panel by RT-PCR (Flu A&B, Covid) Nasopharyngeal Swab     Status: None   Collection Time: 12/26/20  1:03 PM   Specimen:  Nasopharyngeal Swab; Nasopharyngeal(NP) swabs in vial transport medium  Result Value Ref Range Status   SARS Coronavirus 2 by RT PCR NEGATIVE NEGATIVE Final    Comment: (NOTE) SARS-CoV-2 target nucleic acids are NOT DETECTED.  The SARS-CoV-2 RNA is generally detectable in upper respiratory specimens during the acute phase of infection. The lowest concentration of SARS-CoV-2 viral copies this assay can detect is 138 copies/mL. A negative result does not preclude SARS-Cov-2 infection and should not be used as the sole basis for treatment or other patient management decisions. A negative result may occur with  improper specimen collection/handling, submission of specimen other than nasopharyngeal swab, presence of viral mutation(s) within the areas targeted by this assay, and inadequate number of viral copies(<138 copies/mL). A negative result must be combined with clinical observations, patient history, and epidemiological information. The expected result is Negative.  Fact Sheet for Patients:  EntrepreneurPulse.com.au  Fact Sheet for Healthcare Providers:  IncredibleEmployment.be  This test is no t yet approved or cleared by the [place] FDA and  has been authorized for detection and/or diagnosis of SARS-CoV-2 by FDA under an Emergency Use Authorization (EUA). This EUA will remain  in effect (meaning this test can be used) for the duration of the COVID-19 declaration under Section 564(b)(1) of the Act, 21 U.S.C.section 360bbb-3(b)(1), unless the authorization is terminated  or revoked sooner.       Influenza A by PCR NEGATIVE NEGATIVE Final   Influenza B by PCR NEGATIVE NEGATIVE Final    Comment: (NOTE) The Xpert Xpress SARS-CoV-2/FLU/RSV plus assay is intended as an aid in the diagnosis of influenza from Nasopharyngeal swab specimens and should not be used as a sole basis for treatment. Nasal washings and aspirates are unacceptable for  Xpert Xpress SARS-CoV-2/FLU/RSV testing.  Fact Sheet for Patients: EntrepreneurPulse.com.au  Fact Sheet for Healthcare Providers: IncredibleEmployment.be  This test is not yet approved or cleared by the [place] FDA and has been authorized for detection and/or diagnosis of SARS-CoV-2 by FDA under an Emergency Use Authorization (EUA). This EUA will remain in effect (meaning this test can be used) for the duration of the COVID-19 declaration under Section 564(b)(1) of the Act, 21 U.S.C. section 360bbb-3(b)(1), unless the authorization is terminated or revoked.  Performed at [facility], [address] [place], [place] [address]       Imaging Studies  US RENAL  Result Date: 12/27/2020 CLINICAL DATA:  Acute kidney injury EXAM: RENAL / URINARY TRACT ULTRASOUND COMPLETE COMPARISON:  None. FINDINGS: Right Kidney: Renal measurements: 10.5 x 4.8 x 4.3 cm = volume: 114 mL. Echogenicity within normal limits. No mass or hydronephrosis visualized. Left Kidney: Renal measurements: 10.6 x 5.4 x 4.8 cm = volume: 142 mL. Echogenicity within normal limits. No mass or hydronephrosis visualized. Bladder: Unable to evaluate due to decompression. Other: Diffuse increased echogenicity of the visualized portions of the hepatic parenchyma are a nonspecific indicator of hepatocellular dysfunction, most commonly steatosis. IMPRESSION: No significant sonographic abnormality of the kidneys. Electronically Signed   By: Miachel Roux M.D.   On: 12/27/2020 16:41   DG ESOPHAGUS W SINGLE CM (SOL OR THIN BA)  Result Date: 12/27/2020 CLINICAL DATA:  Regurgitation of food EXAM: ESOPHAGUS/BARIUM SWALLOW/TABLET STUDY TECHNIQUE: Combined double and single contrast examination was performed using effervescent crystals, high-density barium, and thin liquid barium. FLUOROSCOPY TIME:  Radiation Exposure Index (as provided by the fluoroscopic device): 3.4 minutes If the device  does not provide the exposure index: Fluoroscopy Time:  59.8 mGy Number of Acquired Images:  5 COMPARISON:  NONE. FINDINGS: The patient could not stand up straight, roll up onto his side, or go into the prone position. Images were obtained in the AP and RPO projection. Findings are made within these confines. Swallowing: The patient swallowed thick barium without difficulty. Pharynx: Unremarkable. Esophagus: The esophagus was moderately dilated. No fixed stricture or mucosal lesion was identified. Esophageal motility: There is marked esophageal dysmotility with transient appearance of esophageal spasm. The lower esophageal sphincter transiently opened a small amount one single time, but was not seen to clearly open and relax despite several minutes of waiting with intermittent fluoro. Hiatal Hernia: None. Gastroesophageal reflux: None visualized. Provocative maneuvers were not performed. Ingested 30mm barium tablet: A tablet was not given as held up contrast was obscuring the GE junction. Other: None. IMPRESSION: 1. The lower esophageal frank tear was not seen to clearly relax and open during the study causing significant holdup of barium in the lower esophagus. This raises suspicion for an obstructing mass at the GE junction or possibly achalasia, though the typical bird's beak appearance of achalasia is not seen. Repeat study on an outpatient basis when the patient can tolerate different positions and standing upright may be useful for further evaluation. Endoscopy is also recommended for direct visualization of the GE junction. 2. Esophageal dysmotility with an appearance suggestive of intermittent esophageal spasm. Electronically Signed   By: Valetta Mole M.D.   On: 12/27/2020 16:40     Medications   Scheduled Meds:  baclofen  10 mg Oral BID   enoxaparin (LOVENOX) injection  40 mg Subcutaneous Q24H   feeding supplement  237 mL Oral TID BM   folic acid  1 mg Oral Daily   multivitamin with minerals  1  tablet Oral Daily   saccharomyces boulardii  250 mg Oral BID   vitamin B-12  1,000 mcg Oral Daily   Vitamin D (Ergocalciferol)  50,000 Units Oral Q7 days   Continuous Infusions:  chlorproMAZINE (THORAZINE) IV 12.5 mg (12/28/20 1010)       LOS: 0 days    Time spent: 30 minutes with > 50% spent at bedside and in coordination of care     Ezekiel Slocumb, DO [facility]  12/28/2020, 4:37 PM      If 7PM-7AM, please contact night-coverage. How to contact the [facility] Attending or Consulting provider  7A - 7P or covering provider during after hours [employer], for this patient?    Check the care team in [facility] and look for a) attending/consulting TR[facility] provider listed and b) the [facility] team listed Log into [email or web address] and use [facility]'s universal password to access. If you do not have the password, please contact the hospital operator. Locate the [facility] provider you are looking for under [facility] and page to a number that you can be directly reached. If you still have difficulty reaching the provider, please page the [facility] (Director on Call) for the Hospitalists listed on amion for assistance.

## 2020-12-28 NOTE — TOC Progression Note (Signed)
Transition of Care [facility]) - Progression Note    Patient Details  Name: John Booker MRN: [id number] Date of Birth: [birth year]  Transition of Care [facility]) CM/SW [place], RN Phone Number: 12/28/2020, 4:56 PM  Clinical Narrative:   Patient will be set up with [facility] as per Corene Cornea at [place].  TOC to follow to discharge.      Expected Discharge Plan: [place] Barriers to Discharge: Continued Medical Work up  Expected Discharge Plan and Services Expected Discharge Plan: [place]   Discharge Planning Services: CM Consult   Living arrangements for the past 2 months: Single Family Home                 DME Arranged:  (TBD)         HH Arranged:  (TBD)           Social Determinants of Health (SDOH) Interventions    Readmission Risk Interventions No flowsheet data found.

## 2020-12-28 NOTE — Anesthesia Postprocedure Evaluation (Signed)
Anesthesia Post Note  Patient: [name]  Procedure(s) Performed: ESOPHAGOGASTRODUODENOSCOPY (EGD) WITH PROPOFOL  Patient location during evaluation: PACU Anesthesia Type: General Level of consciousness: awake, awake and alert and oriented Pain management: pain level controlled Vital Signs Assessment: post-procedure vital signs reviewed and stable Respiratory status: spontaneous breathing and nonlabored ventilation Cardiovascular status: stable Anesthetic complications: no   No notable events documented.   Last Vitals:  Vitals:   12/28/20 1156 12/28/20 1206  BP: 132/65 (!) 145/84  Pulse: 79 75  Resp: (!) 21 20  Temp:    SpO2: 94% 94%    Last Pain:  Vitals:   12/28/20 1206  TempSrc:   PainSc: 0-No pain                 VAN STAVEREN,Zared Knoth

## 2020-12-28 NOTE — Progress Notes (Signed)
   12/28/20 1712  What Happened  Was fall witnessed? Yes  Who witnessed fall? Arville Lime, RN  Patients activity before fall ambulating-unassisted;bathroom-unassisted  Point of contact hip/leg  Was patient injured? No  Follow Up  MD notified Nicole Kindred, DO  Time MD notified [phone]  Family notified Yes - comment  Time family notified 1741  Additional tests Yes-comment (CXR)  Progress note created (see row info) Yes  Adult Fall Risk Assessment  Risk Factor Category (scoring not indicated) History of more than one fall within 6 months before admission (document High fall risk)  Age 70  Fall History: Fall within 6 months prior to admission 5  Elimination; Bowel and/or Urine Incontinence 0  Elimination; Bowel and/or Urine Urgency/Frequency 2  Medications: includes PCA/Opiates, Anti-convulsants, Anti-hypertensives, Diuretics, Hypnotics, Laxatives, Sedatives, and Psychotropics 3  Patient Care Equipment 1  Mobility-Assistance 2  Mobility-Gait 2  Mobility-Sensory Deficit 0  Altered awareness of immediate physical environment 1  Impulsiveness 2  Lack of understanding of one's physical/cognitive limitations 4  Total Score 24  Patient Fall Risk Level High fall risk  Adult Fall Risk Interventions  Required Bundle Interventions *See Row Information* High fall risk - low, moderate, and high requirements implemented  Additional Interventions PT/OT need assessed if change in mobility from baseline  Screening for Fall Injury Risk (To be completed on HIGH fall risk patients) - Assessing Need for Floor Mats  Risk For Fall Injury- Criteria for Floor Mats None identified - No additional interventions needed  Vitals  BP (!) 138/102  MAP (mmHg) 108  BP Location Left Arm  BP Method Automatic  Patient Position (if appropriate) Sitting  Pulse Rate (!) 145  Pulse Rate Source Monitor  Resp 16  Oxygen Therapy  O2 Device Room Air  Pain Assessment  Pain Scale 0-10  Neurological  Neuro (WDL) X   Level of Consciousness Alert  Orientation Level Oriented to person;Oriented to time;Oriented to situation;Disoriented to place  Cognition Impulsive;Poor safety awareness;Poor judgement  Speech Clear  Musculoskeletal  Musculoskeletal (WDL) X  Assistive Device Front wheel walker  Generalized Weakness Yes  Weight Bearing Restrictions No  Integumentary  Integumentary (WDL) X  Skin Color Appropriate for ethnicity  Skin Condition Dry  Skin Integrity Ecchymosis    This nurse walked in patients room at 1700 and noticed he was partially on the floor. His legs and lower torso were on the floor but the upper part of his body was not as he was hanging onto the bed to prevent himself from falling any further. This nurse called out to charge nurse and secretary to assist patient safely back to bed. He denies any pain or injury to himself at this time. VS obtained and are abnormal. Patient is currently under the yellow MEWS protocol and is being monitored more frequently due to prior abnormal VS. Family and provider notified. Will continue to monitor.

## 2020-12-28 NOTE — Transfer of Care (Signed)
Immediate Anesthesia Transfer of Care Note  Patient: John Booker  Procedure(s) Performed: ESOPHAGOGASTRODUODENOSCOPY (EGD) WITH PROPOFOL  Patient Location: PACU and Endoscopy Unit  Anesthesia Type:General  Level of Consciousness: drowsy  Airway & Oxygen Therapy: Patient Spontanous Breathing and Patient connected to face mask oxygen  Post-op Assessment: Report given to RN  Post vital signs: stable  Last Vitals:  Vitals Value Taken Time  BP 129/79 12/28/20 1135  Temp    Pulse 96 12/28/20 1135  Resp 20 12/28/20 1135  SpO2 98 % 12/28/20 1135  Vitals shown include unvalidated device data.  Last Pain:  Vitals:   12/28/20 1103  TempSrc: Temporal  PainSc: 0-No pain         Complications: No notable events documented.

## 2020-12-28 NOTE — Interval H&P Note (Signed)
History and Physical Interval Note: Preprocedure H&P from 12/28/20  was reviewed and there was no interval change after seeing and examining the patient.  Written consent was obtained from the patient after discussion of risks, benefits, and alternatives. Patient has consented to proceed with Esophagogastroduodenoscopy with possible intervention   12/28/2020 10:52 AM  John Booker  has presented today for surgery, with the diagnosis of hiccups, weight loss, abnormal barium swallow.  The various methods of treatment have been discussed with the patient and family. After consideration of risks, benefits and other options for treatment, the patient has consented to  Procedure(s): ESOPHAGOGASTRODUODENOSCOPY (EGD) WITH PROPOFOL (N/A) as a surgical intervention.  The patient's history has been reviewed, patient examined, no change in status, stable for surgery.  I have reviewed the patient's chart and labs.  Questions were answered to the patient's satisfaction.     Annamaria Helling

## 2020-12-28 NOTE — Consult Note (Signed)
[facility] Gastroenterology Inpatient Consultation   Patient ID: John Booker is a 70 y.o. male.  Requesting Provider: Nicole Kindred, DO  Date of Admission: 12/26/2020  Date of Consult: 12/28/20   Reason for Consultation: abnormal barium swallow   Patient's Chief Complaint:   Chief Complaint  Patient presents with   Weakness   Fall    70 year old Caucasian male with history of alcohol abuse, hypertension who presents for generalized weakness, chronic intractable hiccups, and weight loss.  Patient seen resting in bed comfortably on Thorazine drip, still with hiccups.  Patient has been evaluated in the past for his hiccups in 2020 including seeing gastroenterology and general surgery.  He continues to have issues and notes 20 pound weight loss in 2 years time.  He has a small hiatal hernia noted on EGD in March 2020 and was sent to general surgery who recommended medical management at that time.  Barium swallow performed during his hospitalization demonstrating esophageal dysmotility and spasm.  It was also noted that his lower esophagus did not relax and open appropriately raising concern for obstructive mass.  Patient notes suprasternal sticking when swallowing solids liquids and pills.  He denies odynophagia.  He notes nausea and vomiting.  Patient denies alcohol, but his daughter notes that he drinks 12 to 16 ounces of bourbon per day per notation.  Denies any tobacco use.  John Booker is a 70 y.o. male with medical history significant for Alcohol abuse, hypertension, remote history of perirectal abscess in 2017, who presents emergency department for weakness and increased urination.   He reports the weakness has been gone going and worse recently.  He endorses poor p.o. intake for the last 2 years.  He reports that this poor p.o. intake is attribute it to his persistent and intractable hiccups.   AST ALT ratio on presentation consistent with alcohol abuse AST 98 ALT 27.  Mild  hyperbilirubinemia at 1.8 which is also consistent with alcohol abuse.  Patient reports no historic response to baclofen.  Bowel movements are loose but no blood or melena.  No jaundice or pruritus.  Denies NSAIDs, Anti-plt agents, and anticoagulants Denies family history of gastrointestinal disease and malignancy Previous Endoscopies: 05/2018 EGD- small hiatal hernia; biopsies negative for BE and HP   Past Medical History:  Diagnosis Date   Hypertension     Past Surgical History:  Procedure Laterality Date   ESOPHAGOGASTRODUODENOSCOPY (EGD) WITH PROPOFOL N/A 05/26/2018   Procedure: ESOPHAGOGASTRODUODENOSCOPY (EGD) WITH PROPOFOL;  Surgeon: Virgel Manifold, MD;  Location: [facility];  Service: Endoscopy;  Laterality: N/A;   INCISION AND DRAINAGE PERIRECTAL ABSCESS N/A 11/19/2015   Procedure: IRRIGATION AND DEBRIDEMENT PERIRECTAL ABSCESS and debridement of scrotal abscess;  Surgeon: Florene Glen, MD;  Location: [facility] ORS;  Service: General;  Laterality: N/A;   INCISION AND DRAINAGE PERIRECTAL ABSCESS N/A 11/20/2015   Procedure: IRRIGATION AND DEBRIDEMENT PERIRECTAL ABSCESS / WITH DRESSING CHANGE;  Surgeon: Florene Glen, MD;  Location: [facility] ORS;  Service: General;  Laterality: N/A;  peri rectal site   INCISION AND DRAINAGE PERIRECTAL ABSCESS N/A 11/21/2015   Procedure: IRRIGATION AND DEBRIDEMENT PERIRECTAL ABSCESS WITH DRESSING CHANGE;  Surgeon: Florene Glen, MD;  Location: [facility] ORS;  Service: General;  Laterality: N/A;   MINOR HEMORRHOIDECTOMY  1989    Allergies  Allergen Reactions   Adalat [Nifedipine]     Foot swelling.    Amlodipine Other (See Comments)    Leg swelling    Family History  Problem Relation Age  of Onset   Hypertension Father     Social History   Tobacco Use   Smoking status: Former    Packs/day: 0.50    Years: 38.00    Pack years: 19.00    Types: Cigarettes   Smokeless tobacco: Former  Scientific laboratory technician Use: Never used  Substance  Use Topics   Alcohol use: No   Drug use: No     Pertinent GI related history and allergies were reviewed with the patient  Review of Systems  Constitutional:  Positive for appetite change (diminished) and unexpected weight change (lost 20 lbs in 2 years). Negative for activity change, chills, fatigue and fever.  HENT:  Positive for trouble swallowing. Negative for voice change.   Respiratory:  Negative for shortness of breath and wheezing.   Cardiovascular:  Negative for chest pain and palpitations.  Gastrointestinal:  Positive for diarrhea, nausea and vomiting. Negative for abdominal distention, abdominal pain, anal bleeding, blood in stool and constipation.       Intractable hiccups  Musculoskeletal:  Negative for arthralgias and myalgias.  Skin:  Negative for color change and pallor.  Neurological:  Negative for dizziness, syncope and weakness.  Psychiatric/Behavioral:  Negative for confusion. The patient is not nervous/anxious.   All other systems reviewed and are negative.   Medications Home Medications No current facility-administered medications on file prior to encounter.   Current Outpatient Medications on File Prior to Encounter  Medication Sig Dispense Refill   baclofen (LIORESAL) 10 MG tablet Take 1 tablet (10 mg total) by mouth 2 (two) times daily. 180 each 3   hydrochlorothiazide (HYDRODIURIL) 25 MG tablet Take 1 tablet (25 mg total) by mouth daily. 90 tablet 3   lisinopril (ZESTRIL) 40 MG tablet Take 1 tablet (40 mg total) by mouth daily. 90 tablet 3   sildenafil (REVATIO) 20 MG tablet Take 1-5 pills about 30 min prior to sex. Start with 1 and increase as needed. 30 tablet 3   Pertinent GI related medications were reviewed with the patient  Inpatient Medications  Current Facility-Administered Medications:    0.9 %  sodium chloride infusion, , Intravenous, Continuous, Ezekiel Slocumb, DO, Last Rate: 50 mL/hr at 12/28/20 0901, Rate Change at 12/28/20 0901    acetaminophen (TYLENOL) tablet 650 mg, 650 mg, Oral, Q6H PRN **OR** acetaminophen (TYLENOL) suppository 650 mg, 650 mg, Rectal, Q6H PRN, Cox, Amy N, DO   baclofen (LIORESAL) tablet 10 mg, 10 mg, Oral, BID, Cox, Amy N, DO, 10 mg at 12/27/20 2127   chlorproMAZINE (THORAZINE) 12.5 mg in sodium chloride 0.9 % 25 mL IVPB, 12.5 mg, Intravenous, Q6H PRN, Bhagat, Srishti L, MD   enoxaparin (LOVENOX) injection 40 mg, 40 mg, Subcutaneous, Q24H, Cox, Amy N, DO, 40 mg at 20/94/70 9628   folic acid (FOLVITE) tablet 1 mg, 1 mg, Oral, Daily, Cox, Amy N, DO, 1 mg at 12/27/20 0947   LORazepam (ATIVAN) tablet 1-4 mg, 1-4 mg, Oral, Q1H PRN, 1 mg at 12/26/20 2345 **OR** LORazepam (ATIVAN) injection 1-4 mg, 1-4 mg, Intravenous, Q1H PRN, Cox, Amy N, DO   multivitamin with minerals tablet 1 tablet, 1 tablet, Oral, Daily, Cox, Amy N, DO, 1 tablet at 12/27/20 0947   ondansetron (ZOFRAN) tablet 4 mg, 4 mg, Oral, Q6H PRN **OR** ondansetron (ZOFRAN) injection 4 mg, 4 mg, Intravenous, Q6H PRN, Cox, Amy N, DO   saccharomyces boulardii (FLORASTOR) capsule 250 mg, 250 mg, Oral, BID, Sharion Settler, NP, 250 mg at 12/27/20 2127   vitamin  B-12 (CYANOCOBALAMIN) tablet 1,000 mcg, 1,000 mcg, Oral, Daily, Bhagat, Srishti L, MD   Vitamin D (Ergocalciferol) (DRISDOL) capsule 50,000 Units, 50,000 Units, Oral, Q7 days, Bhagat, Srishti L, MD, 50,000 Units at 12/27/20 2127  sodium chloride 50 mL/hr at 12/28/20 0901   chlorproMAZINE (THORAZINE) IV      acetaminophen **OR** acetaminophen, chlorproMAZINE (THORAZINE) IV, LORazepam **OR** LORazepam, ondansetron **OR** ondansetron (ZOFRAN) IV   Objective   Vitals:   12/27/20 2053 12/28/20 0046 12/28/20 0423 12/28/20 0720  BP: 128/64 126/78 (!) 149/98 (!) 152/93  Pulse: 90 81 80 71  Resp: 18 16 18 16   Temp: 98.5 F (36.9 C) 98.1 F (36.7 C) 98.1 F (36.7 C) 97.8 F (36.6 C)  TempSrc: Oral   Oral  SpO2: 100% 99% 100% 100%  Weight:      Height:         Physical Exam Vitals and  nursing note reviewed.  Constitutional:      General: He is not in acute distress.    Appearance: Normal appearance. He is not ill-appearing, toxic-appearing or diaphoretic.     Comments: Continued hiccups  HENT:     Head: Normocephalic and atraumatic.     Nose: Nose normal.     Mouth/Throat:     Mouth: Mucous membranes are moist.     Pharynx: Oropharynx is clear.  Eyes:     General: No scleral icterus.    Extraocular Movements: Extraocular movements intact.  Cardiovascular:     Rate and Rhythm: Normal rate and regular rhythm.     Heart sounds: Normal heart sounds. No murmur heard.   No friction rub. No gallop.  Pulmonary:     Effort: Pulmonary effort is normal. No respiratory distress.     Breath sounds: Normal breath sounds. No wheezing, rhonchi or rales.  Abdominal:     General: Bowel sounds are normal. There is no distension.     Palpations: Abdomen is soft.     Tenderness: There is no abdominal tenderness. There is no guarding or rebound.     Comments: No rigidity.  Nonperitoneal  Musculoskeletal:     Cervical back: Neck supple.     Right lower leg: No edema.     Left lower leg: No edema.  Skin:    General: Skin is warm and dry.     Coloration: Skin is not jaundiced or pale.     Comments: Telangiectasias noted about the face  Neurological:     General: No focal deficit present.     Mental Status: He is alert and oriented to person, place, and time. Mental status is at baseline.  Psychiatric:        Mood and Affect: Mood normal.        Behavior: Behavior normal.        Thought Content: Thought content normal.        Judgment: Judgment normal.    Laboratory Data Recent Labs  Lab 12/26/20 0819 12/27/20 0452 12/28/20 0239  WBC 7.5 4.0 3.7*  HGB 15.9 13.9 12.6*  HCT 46.9 40.2 36.7*  PLT 157 129* 120*   Recent Labs  Lab 12/26/20 0819 12/27/20 0452 12/28/20 0239  NA 129* 132* 132*  K 4.1 3.3* 3.4*  CL 91* 97* 100  CO2 26 27 26   BUN 22 23 24*  CALCIUM 8.9  8.4* 8.1*  PROT 7.0  --   --   BILITOT 1.8*  --   --   ALKPHOS 78  --   --  ALT 27  --   --   AST 98*  --   --   GLUCOSE 103* 85 93   No results for input(s): INR in the last 168 hours.  Recent Labs    12/26/20 0819  LIPASE 26        Imaging Studies: DG Chest 2 View  Result Date: 12/26/2020 CLINICAL DATA:  Weakness. EXAM: CHEST - 2 VIEW COMPARISON:  Prior chest radiographs 03/03/2017 and earlier. FINDINGS: Heart size within normal limits. No appreciable airspace consolidation. No evidence of pleural effusion or pneumothorax. No acute bony abnormality identified. IMPRESSION: No evidence of active cardiopulmonary disease. Electronically Signed   By: Kellie Simmering D.O.   On: 12/26/2020 11:23   US RENAL  Result Date: 12/27/2020 CLINICAL DATA:  Acute kidney injury EXAM: RENAL / URINARY TRACT ULTRASOUND COMPLETE COMPARISON:  None. FINDINGS: Right Kidney: Renal measurements: 10.5 x 4.8 x 4.3 cm = volume: 114 mL. Echogenicity within normal limits. No mass or hydronephrosis visualized. Left Kidney: Renal measurements: 10.6 x 5.4 x 4.8 cm = volume: 142 mL. Echogenicity within normal limits. No mass or hydronephrosis visualized. Bladder: Unable to evaluate due to decompression. Other: Diffuse increased echogenicity of the visualized portions of the hepatic parenchyma are a nonspecific indicator of hepatocellular dysfunction, most commonly steatosis. IMPRESSION: No significant sonographic abnormality of the kidneys. Electronically Signed   By: Miachel Roux M.D.   On: 12/27/2020 16:41   DG ESOPHAGUS W SINGLE CM (SOL OR THIN BA)  Result Date: 12/27/2020 CLINICAL DATA:  Regurgitation of food EXAM: ESOPHAGUS/BARIUM SWALLOW/TABLET STUDY TECHNIQUE: Combined double and single contrast examination was performed using effervescent crystals, high-density barium, and thin liquid barium. FLUOROSCOPY TIME:  Radiation Exposure Index (as provided by the fluoroscopic device): 3.4 minutes If the device does not  provide the exposure index: Fluoroscopy Time:  59.8 mGy Number of Acquired Images:  5 COMPARISON:  NONE. FINDINGS: The patient could not stand up straight, roll up onto his side, or go into the prone position. Images were obtained in the AP and RPO projection. Findings are made within these confines. Swallowing: The patient swallowed thick barium without difficulty. Pharynx: Unremarkable. Esophagus: The esophagus was moderately dilated. No fixed stricture or mucosal lesion was identified. Esophageal motility: There is marked esophageal dysmotility with transient appearance of esophageal spasm. The lower esophageal sphincter transiently opened a small amount one single time, but was not seen to clearly open and relax despite several minutes of waiting with intermittent fluoro. Hiatal Hernia: None. Gastroesophageal reflux: None visualized. Provocative maneuvers were not performed. Ingested 93mm barium tablet: A tablet was not given as held up contrast was obscuring the GE junction. Other: None. IMPRESSION: 1. The lower esophageal frank tear was not seen to clearly relax and open during the study causing significant holdup of barium in the lower esophagus. This raises suspicion for an obstructing mass at the GE junction or possibly achalasia, though the typical bird's beak appearance of achalasia is not seen. Repeat study on an outpatient basis when the patient can tolerate different positions and standing upright may be useful for further evaluation. Endoscopy is also recommended for direct visualization of the GE junction. 2. Esophageal dysmotility with an appearance suggestive of intermittent esophageal spasm. Electronically Signed   By: Valetta Mole M.D.   On: 12/27/2020 16:40    Assessment:   # Abnormal Barium swallow - abnormal LES relaxation raising concern for obstruction  # intractable hiccups - has undergone GI and surgical evaluation in the past (2020)  with no etiology noted - currently on  thorazine, has not responded to baclofen in the past  # dysphagia- solids, liquids, pills - suprasternal sticking  # Alcohol abuse with alcoholic hepatitis - AST:ALT >2:1  # Hiatal hernia - small noted in 05/2018 egd  # electrolyte abnormalities - hyponatremia, hypokalemia, hypomagnesemia  # generalized weakness  # weight loss- 20+ lbs in 2  years - albumin 3.7  Plan:  Pt npo since midnight, lovenox given last night Plan for egd today for evaluation If patient has not undergone CT of chest/abdomen - recommend he does this to exclude any tumor causing issue to phrenic nerve. This can be done on outpatient vs inpatient basis- defer to primary team Further recommendations pending endoscopy- please see report once complete Continue ppi Continue thorazine; gabapentin and reglan may be of consideration if other medications do not work Correction of electrolyte abnormalities as per primary team Enteral feeding tube is not indicated in this patient at this time Recommend complete alcohol cessation  Esophagogastroduodenoscopy with possible biopsy, control of bleeding, polypectomy, and interventions as necessary has been discussed with the patient/patient representative. Informed consent was obtained from the patient/patient representative after explaining the indication, nature, and risks of the procedure including but not limited to death, bleeding, perforation, missed neoplasm/lesions, cardiorespiratory compromise, and reaction to medications. Opportunity for questions was given and appropriate answers were provided. Patient/patient representative has verbalized understanding is amenable to undergoing the procedure.  I personally performed the service.  Management of other medical comorbidities as per primary team  Thank you for allowing [place] to participate in this patient's care. Please don't hesitate to call if any questions or concerns arise.   Annamaria Helling, DO [facility]  Gastroenterology  Portions of the record may have been created with voice recognition software. Occasional wrong-word or 'sound-a-like' substitutions may have occurred due to the inherent limitations of voice recognition software.  Read the chart carefully and recognize, using context, where substitutions may have occurred.

## 2020-12-28 NOTE — Progress Notes (Signed)
   12/28/20 1539  Assess: MEWS Score  Temp 98.2 F (36.8 C)  BP (!) 146/111  Pulse Rate (!) 114  Resp 16  Level of Consciousness Alert  SpO2 92 %  O2 Device Room Air  Patient Activity (if Appropriate) In bed  Assess: MEWS Score  MEWS Temp 0  MEWS Systolic 0  MEWS Pulse 2  MEWS RR 0  MEWS LOC 0  MEWS Score 2  MEWS Score Color Yellow  Assess: if the MEWS score is Yellow or Red  Were vital signs taken at a resting state? Yes  Focused Assessment No change from prior assessment  Does the patient meet 2 or more of the SIRS criteria? No  MEWS guidelines implemented *See Row Information* Yes  Treat  MEWS Interventions Administered scheduled meds/treatments  Pain Scale 0-10  Pain Score 0  Take Vital Signs  Increase Vital Sign Frequency  Yellow: Q 2hr X 2 then Q 4hr X 2, if remains yellow, continue Q 4hrs  Escalate  MEWS: Escalate Yellow: discuss with charge nurse/RN and consider discussing with provider and RRT  Notify: Charge Nurse/RN  Name of Charge Nurse/RN Notified Katherine Roan, RN  Date Charge Nurse/RN Notified 12/28/20  Time Charge Nurse/RN Notified 1157  Notify: Provider  Provider Name/Title Nicole Kindred DO  Date Provider Notified 12/28/20  Time Provider Notified 1643  Notification Type  (Secure Chat)  Notification Reason Change in status  Provider response See new orders  Date of Provider Response 12/28/20  Time of Provider Response 1647  Assess: SIRS CRITERIA  SIRS Temperature  0  SIRS Pulse 1  SIRS Respirations  0  SIRS WBC 0  SIRS Score Sum  1

## 2020-12-29 ENCOUNTER — Inpatient Hospital Stay: Payer: Medicare Other

## 2020-12-29 DIAGNOSIS — R531 Weakness: Secondary | ICD-10-CM | POA: Diagnosis not present

## 2020-12-29 DIAGNOSIS — E440 Moderate protein-calorie malnutrition: Secondary | ICD-10-CM | POA: Diagnosis not present

## 2020-12-29 LAB — COMPREHENSIVE METABOLIC PANEL
ALT: 25 U/L (ref 0–44)
AST: 38 U/L (ref 15–41)
Albumin: 2.8 g/dL — ABNORMAL LOW (ref 3.5–5.0)
Alkaline Phosphatase: 45 U/L (ref 38–126)
Anion gap: 5 (ref 5–15)
BUN: 16 mg/dL (ref 8–23)
CO2: 29 mmol/L (ref 22–32)
Calcium: 8.1 mg/dL — ABNORMAL LOW (ref 8.9–10.3)
Chloride: 100 mmol/L (ref 98–111)
Creatinine, Ser: 1.11 mg/dL (ref 0.61–1.24)
GFR, Estimated: >60 mL/min (ref >60)
Glucose, Bld: 90 mg/dL (ref 70–99)
Potassium: 4 mmol/L (ref 3.5–5.1)
Sodium: 134 mmol/L — ABNORMAL LOW (ref 135–145)
Total Bilirubin: 1.2 mg/dL (ref 0.3–1.2)
Total Protein: 5.4 g/dL — ABNORMAL LOW (ref 6.5–8.1)

## 2020-12-29 LAB — CBC
HCT: 39.5 % (ref 39.0–52.0)
Hemoglobin: 13.4 g/dL (ref 13.0–17.0)
MCH: 33.7 pg (ref 26.0–34.0)
MCHC: 33.9 g/dL (ref 30.0–36.0)
MCV: 99.2 fL (ref 80.0–100.0)
Platelets: 132 10*3/uL — ABNORMAL LOW (ref 150–400)
RBC: 3.98 MIL/uL — ABNORMAL LOW (ref 4.22–5.81)
RDW: 14.9 % (ref 11.5–15.5)
WBC: 8.1 10*3/uL (ref 4.0–10.5)
nRBC: 0 % (ref 0.0–0.2)

## 2020-12-29 LAB — COPPER, SERUM: Copper: 117 ug/dL (ref 69–132)

## 2020-12-29 LAB — ZINC: Zinc: 40 ug/dL — ABNORMAL LOW (ref 44–115)

## 2020-12-29 LAB — MAGNESIUM: Magnesium: 1.7 mg/dL (ref 1.7–2.4)

## 2020-12-29 MED ORDER — CHLORPROMAZINE HCL 10 MG PO TABS
10.0000 mg | ORAL_TABLET | Freq: Two times a day (BID) | ORAL | Status: DC
  Administered 2020-12-29 – 2020-12-31 (×5): 10 mg via ORAL
  Filled 2020-12-29 (×6): qty 1

## 2020-12-29 MED ORDER — AMOXICILLIN-POT CLAVULANATE 875-125 MG PO TABS: 1.0000 | ORAL_TABLET | Freq: Two times a day (BID) | ORAL | Status: DC

## 2020-12-29 MED ORDER — CHLORPROMAZINE HCL 10 MG PO TABS
10.0000 mg | ORAL_TABLET | Freq: Four times a day (QID) | ORAL | Status: DC
  Filled 2020-12-29 (×4): qty 1

## 2020-12-29 MED ORDER — AMOXICILLIN-POT CLAVULANATE 400-57 MG/5ML PO SUSR
800.0000 mg | Freq: Two times a day (BID) | ORAL | Status: DC
  Administered 2020-12-29 – 2020-12-31 (×5): 800 mg via ORAL
  Filled 2020-12-29 (×6): qty 10

## 2020-12-29 NOTE — Progress Notes (Signed)
Neurology Progress Note  Patient ID: NATHON STEFANSKI is a 70 y.o. with past medical history significant for hypertension, ongoing daily alcohol use, remote perirectal abscess (2017)  Initially consulted for: Hiccups  Major interval events/Subjective: -Concern for pneumonia on chest x-ray -Fall out of bed overnight -Endoscopy completed notable for gastritis and duodenal erosions which were biopsied, as well as abnormal esophageal motility which was dilated, with recommendation for CT chest abdomen pelvis to rule out other mass/malignancy as well as outpatient colonoscopy -Patient continues to feel weak and reports he is very concerned about the cost of his hospitalization -He does appear somewhat confused as he insists he has been in the hospital since Monday  Exam: Vitals:   12/29/20 0742 12/29/20 1100  BP: 137/75 (!) 126/59  Pulse: 83 89  Resp: 18 16  Temp: 98 F (36.7 C) 98.7 F (37.1 C)  SpO2: 99% 97%   Gen: In bed, comfortable, but he is hiccuping slightly today on examination, has not recently received Thorazine Resp: non-labored breathing, no grossly audible wheezing Cardiac: Perfusing extremities well  Abd: soft, nt Psych: Affect remains flat with some irritability but redirectable   Neuro: MS: Somewhat confused, and says he has been hospitalized for 1 week, but oriented to month, Saturday, year, [facility] and situation CN: EOMI, face symmetric Motor: 5/5 throughout upper extremities, continues to have mild hip flexor weakness left greater than right (4 on the left, 4+ on the right), otherwise 5/5 throughout the lower extremities DTR: Remain 3+ in the biceps, brachioradialis and patellas  Pertinent Labs:  Basic Metabolic Panel: Recent Labs  Lab 12/26/20 0819 12/26/20 1155 12/26/20 1629 12/27/20 0452 12/28/20 0239 12/29/20 0455  NA 129*  --   --  132* 132* 134*  K 4.1  --   --  3.3* 3.4* 4.0  CL 91*  --   --  97* 100 100  CO2 26  --   --  27 26 29   GLUCOSE 103*  --    --  85 93 90  BUN 22  --   --  23 24* 16  CREATININE 1.45*  --   --  1.52* 1.13 1.11  CALCIUM 8.9  --   --  8.4* 8.1* 8.1*  MG  --   --  1.5*  --  1.7 1.7  PHOS  --  2.5  --   --   --   --     CBC: Recent Labs  Lab 12/26/20 0819 12/27/20 0452 12/28/20 0239 12/29/20 0455  WBC 7.5 4.0 3.7* 8.1  HGB 15.9 13.9 12.6* 13.4  HCT 46.9 40.2 36.7* 39.5  MCV 98.7 100.8* 98.9 99.2  PLT 157 129* 120* 132*    Coagulation Studies: No results for input(s): LABPROT, INR in the last 72 hours.    Impression: Recurrent hiccups in the setting of not having Thorazine recently.  Additionally he appears to developed a pneumonia and possibly some delirium in the setting of likely alcohol withdrawal and his medical comorbidities  Recommendations: -Schedule Thorazine every 12 hours 10 mg, if hiccups remain uncontrolled may gradually uptitrate as needed -Continue baclofen 10 mg twice daily -Patient again agreed to MRI brain and cervical spine though reluctantly given concern for cost, orders placed again -Agree with GI that CT chest abdomen pelvis is reasonable study but could be completed on an outpatient basis -Appreciate management of pneumonia and other comorbidities per primary team -Neurology will follow up MRI brain and cervical spine otherwise be available on an  as-needed basis going forward  Lesleigh Noe MD-PhD [facility] [phone]   Greater than 35 minutes were spent in care of this patient today, of which greater than 50% was at bedside

## 2020-12-29 NOTE — Progress Notes (Signed)
PROGRESS NOTE    John Booker   WVP:[id number]  DOB: 1950/10/25  PCP: Olin Hauser, DO    DOA: 12/26/2020 LOS: 1    Brief Narrative / Hospital Course to Date:   70 y.o. male with medical history significant for Alcohol abuse, hypertension, remote history of perirectal abscess in 2017, who presents emergency department for profound generalized weakness and increased urination.  Weakness has been slowly progressive in the setting of inadequate p.o. intake due to intractable hiccups and regurgitating of most solid foods.  He does okay with liquids or very soft foods such as ice cream or applesauce.  Prior EGD 05/25/2020 obtained for intractable hiccups, showed a small hiatal hernia.  Of note, patient's family report he drinks 12 to 16 ounces of bourbon daily, lives alone.  Assessment & Plan   Principal Problem:   Weakness Active Problems:   Essential hypertension   Alcohol abuse   Intractable hiccups   COVID-19 vaccination declined   Malnutrition of moderate degree   Esophageal dysmotility   Generalized weakness due to Inadequate oral intake Regurgitation of solid foods due to Esophageal Dysmotility Intractable hiccups Hiatal hernia Given Thorazine IV on admission per recommendation by neurology.  Responds well to combination of baclofen and thorazine.  Barium swallow study showed --  The lower esophageal sphincter was not seen to clearly relax and open during the study causing significant holdup of barium in the lower esophagus. This raises suspicion for an obstructing mass at the GE junction or possibly achalasia, though the typical bird's beak appearance of achalasia is not seen. Repeat study on an outpatient basis when the patient can tolerate different positions and standing upright may be useful for further evaluation. Endoscopy is also recommended for direct visualization of the GE junction Esophageal dysmotility with an appearance suggestive of intermittent  esophageal spasm.   PLAN -- --SLP consulted for swallow evaluation --GI consulted for EGD evaluation --Neurology consulted, appreciate input regarding intractable hiccups.   -- 10/22: Attempt to get MRI brain and C-spine again, patient initially declined but agreeable today --Dietitian consult --Off IV fluids and monitor PO hydration / intake  --Scheduled oral Thorazine and baclofen for hiccups --Uptitrate Thorazine if needed --CT abdomen pelvis as outpatient, per GI --Outpatient GI follow-up  ?  Aspiration pneumonia -suspect with hiccups and frequent belching, and regurgitation patient possibly aspirated.  Chest x-ray obtained evening of 10/21 after he developed tachycardia showed new left airspace opacity consistent with pneumonia.   Patient denies any respiratory symptoms -- Start Augmentin --Monitor clinically --Supplement oxygen if sats drop below 90% --Add supportive care if you become symptomatic  Electrolyte abnormalities: Hypokalemia, hypomagnesemia, hyponatremia Calcium and magnesium normalized with replacement. Sodium improving, hyponatremia mild. --Monitor and replace as needed --Encourage PO intake / hydration  AKI -present on admission with creatinine 1.45, compared to baseline 1.15 on 12/20/2020. Suspect prerenal azotemia due to poor p.o. intake and dehydration. -- Resume maintenance IV fluids given persistent intolerance to p.o. intake -- Follow BMP -- Renal ultrasound unremarkable   Alcohol use disorder -Per daughter, patient consumes about 12 to 16 ounces of bourbon daily. 10/21 PM -elevated heart rates from 114 up to 145 noted.  Suspect due to withdrawal developing. 10/22: Heart rate improved to 80s.  CIWA scores low but has been given Ativan a couple of times --CIWA protocol with as needed Ativan --IV thiamine, PO thiamine at d/c --Monitor and replace electrolytes --Consider maintenance medication to reduce alcohol cravings in outpatient setting --[facility]  consult to  provide resources to assist with cessation  Vitamin D deficiency - started on supplementation --PCP follow up  Vitamin B12 deficiency - started on supplementation --PCP follow up  Essential hypertension -currently with soft blood pressure, sewed of hypotension. Take lisinopril and HCTZ at home. --Hold antihypertensives for now --Monitor BP and resume therapy and when indicated  Continue home baclofen -indication unclear but possibly started for intractable hiccups  Patient BMI: Body mass index is 24.91 kg/m.   DVT prophylaxis: enoxaparin (LOVENOX) injection 40 mg Start: 12/26/20 2200 Place TED hose Start: 12/26/20 1355   Diet:  Diet Orders (From admission, onward)     Start     Ordered   12/29/20 1050  DIET DYS 3 Room service appropriate? Yes; Fluid consistency: Thin  Diet effective now       Question Answer Comment  Room service appropriate? Yes   Fluid consistency: Thin      12/29/20 1049              Code Status: Full Code   Subjective 12/29/20    Patient seen awake resting in bed this morning.  He denied having a fall which is documented in his chart.  Wants to eat more regular food.  Is having recurrent hiccups this morning.  Would like to have Thorazine scheduled to prevent recurrent hiccups.  He denies fevers, chills, cough or shortness of breath.  Denies issues with getting choked on swallowing.  No other acute complaints.   Disposition Plan & Communication   Status is: Inpatient  Remains inpatient appropriate because: Severity of illness with new onset pneumonia and tachycardia overnight   Family Communication: Daughter updated at bedside on 10/21   Consults, Procedures, Significant Events   Consultants:  Neurology  Procedures:  Barium swallow study pending  Antimicrobials:  Anti-infectives (From admission, onward)    Start     Dose/Rate Route Frequency Ordered Stop   12/29/20 1000  amoxicillin-clavulanate (AUGMENTIN) 875-125 MG  per tablet 1 tablet  Status:  Discontinued        1 tablet Oral Every 12 hours 12/29/20 0810 12/29/20 0813   12/29/20 1000  amoxicillin-clavulanate (AUGMENTIN) 400-57 MG/5ML suspension 800 mg        800 mg Oral Every 12 hours 12/29/20 0814 01/03/21 0959         Micro    Objective   Vitals:   12/28/20 2320 12/29/20 0349 12/29/20 0742 12/29/20 1100  BP: 133/85 (!) 113/57 137/75 (!) 126/59  Pulse: 83 82 83 89  Resp: 17 15 18 16   Temp: 98.7 F (37.1 C) 97.9 F (36.6 C) 98 F (36.7 C) 98.7 F (37.1 C)  TempSrc: Oral Oral Oral Oral  SpO2: 96% 100% 99% 97%  Weight:  90.4 kg    Height:        Intake/Output Summary (Last 24 hours) at 12/29/2020 1440 Last data filed at 12/29/2020 1028 Gross per 24 hour  Intake 834.98 ml  Output 1750 ml  Net -915.02 ml   Filed Weights   12/26/20 0812 12/29/20 0349  Weight: 83.9 kg 90.4 kg    Physical Exam:  General exam: Having frequent hiccups, awake and alert, no acute distress Respiratory system: Lungs clear bilaterally, no wheezes or rhonchi, normal respiratory effort, on room air Cardiovascular system: Regular rate and rhythm, stable bilateral pedal edema but no lower extremity edema proximally Gastrointestinal system: Abdomen soft and nontender Central nervous system: A&O x3. no gross focal neurologic deficits, normal speech  Labs   Data Reviewed:  I have personally reviewed following labs and imaging studies  CBC: Recent Labs  Lab 12/26/20 0819 12/27/20 0452 12/28/20 0239 12/29/20 0455  WBC 7.5 4.0 3.7* 8.1  HGB 15.9 13.9 12.6* 13.4  HCT 46.9 40.2 36.7* 39.5  MCV 98.7 100.8* 98.9 99.2  PLT 157 129* 120* 160*   Basic Metabolic Panel: Recent Labs  Lab 12/26/20 0819 12/26/20 1155 12/26/20 1629 12/27/20 0452 12/28/20 0239 12/29/20 0455  NA 129*  --   --  132* 132* 134*  K 4.1  --   --  3.3* 3.4* 4.0  CL 91*  --   --  97* 100 100  CO2 26  --   --  27 26 29   GLUCOSE 103*  --   --  85 93 90  BUN 22  --   --  23 24*  16  CREATININE 1.45*  --   --  1.52* 1.13 1.11  CALCIUM 8.9  --   --  8.4* 8.1* 8.1*  MG  --   --  1.5*  --  1.7 1.7  PHOS  --  2.5  --   --   --   --    GFR: Estimated Creatinine Clearance: 73 mL/min (by C-G formula based on SCr of 1.11 mg/dL). Liver Function Tests: Recent Labs  Lab 12/26/20 0819 12/29/20 0455  AST 98* 38  ALT 27 25  ALKPHOS 78 45  BILITOT 1.8* 1.2  PROT 7.0 5.4*  ALBUMIN 3.7 2.8*   Recent Labs  Lab 12/26/20 0819  LIPASE 26   No results for input(s): AMMONIA in the last 168 hours. Coagulation Profile: No results for input(s): INR, PROTIME in the last 168 hours. Cardiac Enzymes: No results for input(s): CKTOTAL, CKMB, CKMBINDEX, TROPONINI in the last 168 hours. BNP (last 3 results) No results for input(s): PROBNP in the last 8760 hours. HbA1C: No results for input(s): HGBA1C in the last 72 hours. CBG: No results for input(s): GLUCAP in the last 168 hours. Lipid Profile: No results for input(s): CHOL, HDL, LDLCALC, TRIG, CHOLHDL, LDLDIRECT in the last 72 hours. Thyroid Function Tests: No results for input(s): TSH, T4TOTAL, FREET4, T3FREE, THYROIDAB in the last 72 hours.  Anemia Panel: Recent Labs    12/26/20 1527  VITAMINB12 350   Sepsis Labs: No results for input(s): PROCALCITON, LATICACIDVEN in the last 168 hours.  Recent Results (from the past 240 hour(s))  Resp Panel by RT-PCR (Flu A&B, Covid) Nasopharyngeal Swab     Status: None   Collection Time: 12/26/20  1:03 PM   Specimen: Nasopharyngeal Swab; Nasopharyngeal(NP) swabs in vial transport medium  Result Value Ref Range Status   SARS Coronavirus 2 by RT PCR NEGATIVE NEGATIVE Final    Comment: (NOTE) SARS-CoV-2 target nucleic acids are NOT DETECTED.  The SARS-CoV-2 RNA is generally detectable in upper respiratory specimens during the acute phase of infection. The lowest concentration of SARS-CoV-2 viral copies this assay can detect is 138 copies/mL. A negative result does not preclude  SARS-Cov-2 infection and should not be used as the sole basis for treatment or other patient management decisions. A negative result may occur with  improper specimen collection/handling, submission of specimen other than nasopharyngeal swab, presence of viral mutation(s) within the areas targeted by this assay, and inadequate number of viral copies(<138 copies/mL). A negative result must be combined with clinical observations, patient history, and epidemiological information. The expected result is Negative.  Fact Sheet for Patients:  EntrepreneurPulse.com.au  Fact Sheet for Healthcare Providers:  IncredibleEmployment.be  This test is no t yet approved or cleared by the [place] and  has been authorized for detection and/or diagnosis of SARS-CoV-2 by FDA under an Emergency Use Authorization (EUA). This EUA will remain  in effect (meaning this test can be used) for the duration of the COVID-19 declaration under Section 564(b)(1) of the Act, 21 U.S.C.section 360bbb-3(b)(1), unless the authorization is terminated  or revoked sooner.       Influenza A by PCR NEGATIVE NEGATIVE Final   Influenza B by PCR NEGATIVE NEGATIVE Final    Comment: (NOTE) The Xpert Xpress SARS-CoV-2/FLU/RSV plus assay is intended as an aid in the diagnosis of influenza from Nasopharyngeal swab specimens and should not be used as a sole basis for treatment. Nasal washings and aspirates are unacceptable for Xpert Xpress SARS-CoV-2/FLU/RSV testing.  Fact Sheet for Patients: EntrepreneurPulse.com.au  Fact Sheet for Healthcare Providers: IncredibleEmployment.be  This test is not yet approved or cleared by the [place] FDA and has been authorized for detection and/or diagnosis of SARS-CoV-2 by FDA under an Emergency Use Authorization (EUA). This EUA will remain in effect (meaning this test can be used) for the duration of  the COVID-19 declaration under Section 564(b)(1) of the Act, 21 U.S.C. section 360bbb-3(b)(1), unless the authorization is terminated or revoked.  Performed at [facility], [address] [place], [place] [address]       Imaging Studies   US RENAL  Result Date: 01/08/21 CLINICAL DATA:  Acute kidney injury EXAM: RENAL / URINARY TRACT ULTRASOUND COMPLETE COMPARISON:  None. FINDINGS: Right Kidney: Renal measurements: 10.5 x 4.8 x 4.3 cm = volume: 114 mL. Echogenicity within normal limits. No mass or hydronephrosis visualized. Left Kidney: Renal measurements: 10.6 x 5.4 x 4.8 cm = volume: 142 mL. Echogenicity within normal limits. No mass or hydronephrosis visualized. Bladder: Unable to evaluate due to decompression. Other: Diffuse increased echogenicity of the visualized portions of the hepatic parenchyma are a nonspecific indicator of hepatocellular dysfunction, most commonly steatosis. IMPRESSION: No significant sonographic abnormality of the kidneys. Electronically Signed   By: Miachel Roux M.D.   On: 2021/01/08 16:41   DG Chest Port 1 View  Result Date: 12/28/2020 CLINICAL DATA:  Fever EXAM: PORTABLE CHEST 1 VIEW COMPARISON:  02/21/2017, 12/26/2020 FINDINGS: Interim development of patchy airspace disease in the left thorax. Probable small left effusion. Normal cardiac size. No pneumothorax IMPRESSION: Interim development of patchy airspace opacity in the left thorax suspicious for pneumonia. Small left effusion Electronically Signed   By: Donavan Foil M.D.   On: 12/28/2020 19:41   DG ESOPHAGUS W SINGLE CM (SOL OR THIN BA)  Result Date: Jan 08, 2021 CLINICAL DATA:  Regurgitation of food EXAM: ESOPHAGUS/BARIUM SWALLOW/TABLET STUDY TECHNIQUE: Combined double and single contrast examination was performed using effervescent crystals, high-density barium, and thin liquid barium. FLUOROSCOPY TIME:  Radiation Exposure Index (as provided by the fluoroscopic device): 3.4 minutes If the  device does not provide the exposure index: Fluoroscopy Time:  59.8 mGy Number of Acquired Images:  5 COMPARISON:  NONE. FINDINGS: The patient could not stand up straight, roll up onto his side, or go into the prone position. Images were obtained in the AP and RPO projection. Findings are made within these confines. Swallowing: The patient swallowed thick barium without difficulty. Pharynx: Unremarkable. Esophagus: The esophagus was moderately dilated. No fixed stricture or mucosal lesion was identified. Esophageal motility: There is marked esophageal dysmotility with transient appearance of esophageal spasm. The lower esophageal sphincter transiently opened  a small amount one single time, but was not seen to clearly open and relax despite several minutes of waiting with intermittent fluoro. Hiatal Hernia: None. Gastroesophageal reflux: None visualized. Provocative maneuvers were not performed. Ingested 83mm barium tablet: A tablet was not given as held up contrast was obscuring the GE junction. Other: None. IMPRESSION: 1. The lower esophageal frank tear was not seen to clearly relax and open during the study causing significant holdup of barium in the lower esophagus. This raises suspicion for an obstructing mass at the GE junction or possibly achalasia, though the typical bird's beak appearance of achalasia is not seen. Repeat study on an outpatient basis when the patient can tolerate different positions and standing upright may be useful for further evaluation. Endoscopy is also recommended for direct visualization of the GE junction. 2. Esophageal dysmotility with an appearance suggestive of intermittent esophageal spasm. Electronically Signed   By: Valetta Mole M.D.   On: 12/27/2020 16:40     Medications   Scheduled Meds:  amoxicillin-clavulanate  800 mg Oral Q12H   baclofen  10 mg Oral BID   chlorproMAZINE  10 mg Oral Q12H   enoxaparin (LOVENOX) injection  40 mg Subcutaneous Q24H   feeding supplement   237 mL Oral TID BM   folic acid  1 mg Oral Daily   losartan  25 mg Oral Daily   multivitamin with minerals  1 tablet Oral Daily   saccharomyces boulardii  250 mg Oral BID   vitamin B-12  1,000 mcg Oral Daily   Vitamin D (Ergocalciferol)  50,000 Units Oral Q7 days   Continuous Infusions:       LOS: 1 day    Time spent: 30 minutes with > 50% spent at bedside and in coordination of care     Ezekiel Slocumb, DO [facility]  12/29/2020, 2:40 PM      If 7PM-7AM, please contact night-coverage. How to contact the [facility] Attending or Consulting provider [place] or covering provider during after hours [place], for this patient?    Check the care team in [facility] and look for a) attending/consulting [facility] provider listed and b) the [facility] team listed Log into [email or web address] and use [place]'s universal password to access. If you do not have the password, please contact the hospital operator. Locate the [facility] provider you are looking for under [facility] and page to a number that you can be directly reached. If you still have difficulty reaching the provider, please page the [facility] (Director on Call) for the Hospitalists listed on amion for assistance.

## 2020-12-30 ENCOUNTER — Encounter: Payer: Self-pay | Admitting: Gastroenterology

## 2020-12-30 DIAGNOSIS — E440 Moderate protein-calorie malnutrition: Secondary | ICD-10-CM | POA: Diagnosis not present

## 2020-12-30 DIAGNOSIS — M4712 Other spondylosis with myelopathy, cervical region: Secondary | ICD-10-CM | POA: Diagnosis not present

## 2020-12-30 DIAGNOSIS — R531 Weakness: Secondary | ICD-10-CM | POA: Diagnosis not present

## 2020-12-30 LAB — BASIC METABOLIC PANEL
Anion gap: 2 — ABNORMAL LOW (ref 5–15)
BUN: 15 mg/dL (ref 8–23)
CO2: 32 mmol/L (ref 22–32)
Calcium: 8.5 mg/dL — ABNORMAL LOW (ref 8.9–10.3)
Chloride: 100 mmol/L (ref 98–111)
Creatinine, Ser: 0.94 mg/dL (ref 0.61–1.24)
GFR, Estimated: >60 mL/min (ref >60)
Glucose, Bld: 106 mg/dL — ABNORMAL HIGH (ref 70–99)
Potassium: 3.8 mmol/L (ref 3.5–5.1)
Sodium: 134 mmol/L — ABNORMAL LOW (ref 135–145)

## 2020-12-30 LAB — VITAMIN B6: Vitamin B6: 4.8 ug/L (ref 3.4–65.2)

## 2020-12-30 LAB — CBC
HCT: 38.1 % — ABNORMAL LOW (ref 39.0–52.0)
Hemoglobin: 12.8 g/dL — ABNORMAL LOW (ref 13.0–17.0)
MCH: 34.2 pg — ABNORMAL HIGH (ref 26.0–34.0)
MCHC: 33.6 g/dL (ref 30.0–36.0)
MCV: 101.9 fL — ABNORMAL HIGH (ref 80.0–100.0)
Platelets: 147 10*3/uL — ABNORMAL LOW (ref 150–400)
RBC: 3.74 MIL/uL — ABNORMAL LOW (ref 4.22–5.81)
RDW: 15 % (ref 11.5–15.5)
WBC: 6.7 10*3/uL (ref 4.0–10.5)
nRBC: 0 % (ref 0.0–0.2)

## 2020-12-30 LAB — MAGNESIUM: Magnesium: 1.8 mg/dL (ref 1.7–2.4)

## 2020-12-30 NOTE — Consult Note (Signed)
Neurosurgery-New Consultation Evaluation 12/30/2020 John Booker [id number]  Identifying Statement: John Booker is a 70 y.o. male from [place] [id number] with weakness  Physician Requesting Consultation: Nicole Kindred  History of Present Illness: Mr Edelen is here for at least a week fo worsening strength. He does state he feels that he has been having problems with some mild weakness and intermittent numbness in his hands for longer. He has some chronic numbness in his feet. He notes some fine motor difficulty in his hands. He has had some recent falls and given this, he was brought to the ED where a thorough work-up was done and MRI of the cervical spine showing severe stenosis. We are asked to evaluate.myelopathy..   Social History: Social History   Socioeconomic History   Marital status: Legally Separated    Spouse name: Not on file   Number of children: Not on file   Years of education: Not on file   Highest education level: Not on file  Occupational History   Not on file  Tobacco Use   Smoking status: Former    Packs/day: 0.50    Years: 38.00    Pack years: 19.00    Types: Cigarettes   Smokeless tobacco: Former  Scientific laboratory technician Use: Never used  Substance and Sexual Activity   Alcohol use: Not Currently    Comment: several months,[place]   Drug use: No   Sexual activity: Not on file  Other Topics Concern   Not on file  Social History Narrative   Not on file   Social Determinants of Health   Financial Resource Strain: Not on file  Food Insecurity: Not on file  Transportation Needs: Not on file  Physical Activity: Not on file  Stress: Not on file  Social Connections: Not on file  Intimate Partner Violence: Not on file    Family History: Family History  Problem Relation Age of Onset   Hypertension Father     Review of Systems:  Review of Systems - General ROS: Negative Psychological ROS: Negative Ophthalmic ROS: Negative ENT ROS:  Negative Hematological and Lymphatic ROS: Negative  Endocrine ROS: Negative Respiratory ROS: Negative Cardiovascular ROS: Negative Gastrointestinal ROS: Negative Genito-Urinary ROS: Negative Musculoskeletal ROS: Negative Neurological ROS: Positive for weakness, numbness Dermatological ROS: Negative  Physical Exam: BP 127/63 (BP Location: Right Wrist)   Pulse 76   Temp 97.9 F (36.6 C) (Oral)   Resp 16   Ht 6\' 3"  (1.905 m)   Wt 92 kg   SpO2 97%   BMI 25.35 kg/m  Body mass index is 25.35 kg/m. Body surface area is 2.21 meters squared. General appearance: Alert, cooperative, in no acute distress Head: Normocephalic, atraumatic Eyes: Normal, EOM intact Neck: Supple, ROM appears full Ext: Edema noted in LE, discoloration on extremities  Neurologic exam:  Mental status: alertness: alert, affect: normal Speech: fluent and clear Motor:strength symmetric 5/5 in bilateral deltoid, bicep, tricep, grip, IO 5/5 strength in hip flexion, knee flexion/extension, foot dorsi/plantar flexion Sensory: Decreased LT in hands and legs below ankles Reflexes: 2+ and symmetric bilaterally for patella Gait: nottested  Laboratory: Results for orders placed or performed during the hospital encounter of 12/26/20  Resp Panel by RT-PCR (Flu A&B, Covid) Nasopharyngeal Swab   Specimen: Nasopharyngeal Swab; Nasopharyngeal(NP) swabs in vial transport medium  Result Value Ref Range   SARS Coronavirus 2 by RT PCR NEGATIVE NEGATIVE   Influenza A by PCR NEGATIVE NEGATIVE   Influenza B by PCR NEGATIVE NEGATIVE  Basic metabolic panel  Result Value Ref Range   Sodium 129 (L) 135 - 145 mmol/L   Potassium 4.1 3.5 - 5.1 mmol/L   Chloride 91 (L) 98 - 111 mmol/L   CO2 26 22 - 32 mmol/L   Glucose, Bld 103 (H) 70 - 99 mg/dL   BUN 22 8 - 23 mg/dL   Creatinine, Ser 1.45 (H) 0.61 - 1.24 mg/dL   Calcium 8.9 8.9 - 10.3 mg/dL   GFR, Estimated 52 (L) >60 mL/min   Anion gap 12 5 - 15  CBC  Result Value Ref Range    WBC 7.5 4.0 - 10.5 K/uL   RBC 4.75 4.22 - 5.81 MIL/uL   Hemoglobin 15.9 13.0 - 17.0 g/dL   HCT 46.9 39.0 - 52.0 %   MCV 98.7 80.0 - 100.0 fL   MCH 33.5 26.0 - 34.0 pg   MCHC 33.9 30.0 - 36.0 g/dL   RDW 15.4 11.5 - 15.5 %   Platelets 157 150 - 400 K/uL   nRBC 0.0 0.0 - 0.2 %  Urinalysis, Complete w Microscopic Urine, Clean Catch  Result Value Ref Range   Color, Urine AMBER (A) YELLOW   APPearance HAZY (A) CLEAR   Specific Gravity, Urine 1.019 1.005 - 1.030   pH 5.0 5.0 - 8.0   Glucose, UA NEGATIVE NEGATIVE mg/dL   Hgb urine dipstick MODERATE (A) NEGATIVE   Bilirubin Urine NEGATIVE NEGATIVE   Ketones, ur 5 (A) NEGATIVE mg/dL   Protein, ur 30 (A) NEGATIVE mg/dL   Nitrite NEGATIVE NEGATIVE   Leukocytes,Ua NEGATIVE NEGATIVE   RBC / HPF 0-5 0 - 5 RBC/hpf   WBC, UA 0-5 0 - 5 WBC/hpf   Bacteria, UA RARE (A) NONE SEEN   Squamous Epithelial / LPF 0-5 0 - 5   Mucus PRESENT    Hyaline Casts, UA PRESENT   Lipase, blood  Result Value Ref Range   Lipase 26 11 - 51 U/L  Hepatic function panel  Result Value Ref Range   Total Protein 7.0 6.5 - 8.1 g/dL   Albumin 3.7 3.5 - 5.0 g/dL   AST 98 (H) 15 - 41 U/L   ALT 27 0 - 44 U/L   Alkaline Phosphatase 78 38 - 126 U/L   Total Bilirubin 1.8 (H) 0.3 - 1.2 mg/dL   Bilirubin, Direct 0.3 (H) 0.0 - 0.2 mg/dL   Indirect Bilirubin 1.5 (H) 0.3 - 0.9 mg/dL  Osmolality  Result Value Ref Range   Osmolality 277 275 - 295 mOsm/kg  Brain natriuretic peptide  Result Value Ref Range   B Natriuretic Peptide 218.2 (H) 0.0 - 100.0 pg/mL  Vitamin B12  Result Value Ref Range   Vitamin B-12 350 180 - 914 pg/mL  Phosphorus  Result Value Ref Range   Phosphorus 2.5 2.5 - 4.6 mg/dL  TSH  Result Value Ref Range   TSH 3.100 0.350 - 4.500 uIU/mL  Vitamin B1  Result Value Ref Range   Vitamin B1 (Thiamine) 95.0 66.5 - 200.0 nmol/L  Ethanol  Result Value Ref Range   Alcohol, Ethyl (B) <10 <10 mg/dL  VITAMIN D 25 Hydroxy (Vit-D Deficiency, Fractures)  Result  Value Ref Range   Vit D, 25-Hydroxy 14.56 (L) 30 - 100 ng/mL  Zinc  Result Value Ref Range   Zinc 40 (L) 44 - 115 ug/dL  Copper, serum  Result Value Ref Range   Copper 117 69 - 132 ug/dL  Magnesium  Result Value Ref Range   Magnesium  1.5 (L) 1.7 - 2.4 mg/dL  Basic metabolic panel  Result Value Ref Range   Sodium 132 (L) 135 - 145 mmol/L   Potassium 3.3 (L) 3.5 - 5.1 mmol/L   Chloride 97 (L) 98 - 111 mmol/L   CO2 27 22 - 32 mmol/L   Glucose, Bld 85 70 - 99 mg/dL   BUN 23 8 - 23 mg/dL   Creatinine, Ser 1.52 (H) 0.61 - 1.24 mg/dL   Calcium 8.4 (L) 8.9 - 10.3 mg/dL   GFR, Estimated 49 (L) >60 mL/min   Anion gap 8 5 - 15  CBC  Result Value Ref Range   WBC 4.0 4.0 - 10.5 K/uL   RBC 3.99 (L) 4.22 - 5.81 MIL/uL   Hemoglobin 13.9 13.0 - 17.0 g/dL   HCT 40.2 39.0 - 52.0 %   MCV 100.8 (H) 80.0 - 100.0 fL   MCH 34.8 (H) 26.0 - 34.0 pg   MCHC 34.6 30.0 - 36.0 g/dL   RDW 15.3 11.5 - 15.5 %   Platelets 129 (L) 150 - 400 K/uL   nRBC 0.0 0.0 - 0.2 %  Vitamin B6  Result Value Ref Range   Vitamin B6 4.8 3.4 - 65.2 ug/L  Magnesium  Result Value Ref Range   Magnesium 1.7 1.7 - 2.4 mg/dL  Basic metabolic panel  Result Value Ref Range   Sodium 132 (L) 135 - 145 mmol/L   Potassium 3.4 (L) 3.5 - 5.1 mmol/L   Chloride 100 98 - 111 mmol/L   CO2 26 22 - 32 mmol/L   Glucose, Bld 93 70 - 99 mg/dL   BUN 24 (H) 8 - 23 mg/dL   Creatinine, Ser 1.13 0.61 - 1.24 mg/dL   Calcium 8.1 (L) 8.9 - 10.3 mg/dL   GFR, Estimated >60 >60 mL/min   Anion gap 6 5 - 15  CBC  Result Value Ref Range   WBC 3.7 (L) 4.0 - 10.5 K/uL   RBC 3.71 (L) 4.22 - 5.81 MIL/uL   Hemoglobin 12.6 (L) 13.0 - 17.0 g/dL   HCT 36.7 (L) 39.0 - 52.0 %   MCV 98.9 80.0 - 100.0 fL   MCH 34.0 26.0 - 34.0 pg   MCHC 34.3 30.0 - 36.0 g/dL   RDW 15.3 11.5 - 15.5 %   Platelets 120 (L) 150 - 400 K/uL   nRBC 0.0 0.0 - 0.2 %  Magnesium  Result Value Ref Range   Magnesium 1.7 1.7 - 2.4 mg/dL  CBC  Result Value Ref Range   WBC 8.1 4.0  - 10.5 K/uL   RBC 3.98 (L) 4.22 - 5.81 MIL/uL   Hemoglobin 13.4 13.0 - 17.0 g/dL   HCT 39.5 39.0 - 52.0 %   MCV 99.2 80.0 - 100.0 fL   MCH 33.7 26.0 - 34.0 pg   MCHC 33.9 30.0 - 36.0 g/dL   RDW 14.9 11.5 - 15.5 %   Platelets 132 (L) 150 - 400 K/uL   nRBC 0.0 0.0 - 0.2 %  Comprehensive metabolic panel  Result Value Ref Range   Sodium 134 (L) 135 - 145 mmol/L   Potassium 4.0 3.5 - 5.1 mmol/L   Chloride 100 98 - 111 mmol/L   CO2 29 22 - 32 mmol/L   Glucose, Bld 90 70 - 99 mg/dL   BUN 16 8 - 23 mg/dL   Creatinine, Ser 1.11 0.61 - 1.24 mg/dL   Calcium 8.1 (L) 8.9 - 10.3 mg/dL   Total Protein 5.4 (L) 6.5 -  8.1 g/dL   Albumin 2.8 (L) 3.5 - 5.0 g/dL   AST 38 15 - 41 U/L   ALT 25 0 - 44 U/L   Alkaline Phosphatase 45 38 - 126 U/L   Total Bilirubin 1.2 0.3 - 1.2 mg/dL   GFR, Estimated >60 >60 mL/min   Anion gap 5 5 - 15  Basic metabolic panel  Result Value Ref Range   Sodium 134 (L) 135 - 145 mmol/L   Potassium 3.8 3.5 - 5.1 mmol/L   Chloride 100 98 - 111 mmol/L   CO2 32 22 - 32 mmol/L   Glucose, Bld 106 (H) 70 - 99 mg/dL   BUN 15 8 - 23 mg/dL   Creatinine, Ser 0.94 0.61 - 1.24 mg/dL   Calcium 8.5 (L) 8.9 - 10.3 mg/dL   GFR, Estimated >60 >60 mL/min   Anion gap 2 (L) 5 - 15  CBC  Result Value Ref Range   WBC 6.7 4.0 - 10.5 K/uL   RBC 3.74 (L) 4.22 - 5.81 MIL/uL   Hemoglobin 12.8 (L) 13.0 - 17.0 g/dL   HCT 38.1 (L) 39.0 - 52.0 %   MCV 101.9 (H) 80.0 - 100.0 fL   MCH 34.2 (H) 26.0 - 34.0 pg   MCHC 33.6 30.0 - 36.0 g/dL   RDW 15.0 11.5 - 15.5 %   Platelets 147 (L) 150 - 400 K/uL   nRBC 0.0 0.0 - 0.2 %  Magnesium  Result Value Ref Range   Magnesium 1.8 1.7 - 2.4 mg/dL  Troponin I (High Sensitivity)  Result Value Ref Range   Troponin I (High Sensitivity) 31 (H) <18 ng/L  Troponin I (High Sensitivity)  Result Value Ref Range   Troponin I (High Sensitivity) 22 (H) <18 ng/L  Troponin I (High Sensitivity)  Result Value Ref Range   Troponin I (High Sensitivity) 24 (H) <18  ng/L   I personally reviewed labs  Imaging: MRI Cervical Spine: 1. 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 In addition there is moderate bilateral neural foraminal narrowing at this level. 2. Additional more focal increased T2 signal within the left-greater-than-right spinal cord at C5-C6, which may also be myelopathy secondary to compression. There is moderate spinal canal stenosis and moderate to severe bilateral neural foraminal narrowing at this level. 3. C4-C5 mild spinal canal stenosis and severe right neural foraminal narrowing. 4. C6-C7 severe right and mild left neural foraminal narrowing. 5. No acute intracranial process.   Impression/Plan:  Mr Shannahan is here with signs and symptoms of early cervical myelopathy and MRI showing severe stenosis at C3/4 and cord signal change. I have discussed with him that surgical treatment is needed to prevent further damage including risk of weakness and paralysis should he have falls. We discussed a C3/4 ACDF in detail including risks and at this time he declines surgery. He appears to understand the risk of not doing surgery.   If patient should change his mind, we are available to discuss surgery  1.  Diagnosis: Cervical myelopathy  2.  Plan - C3/4 ACDF offered, patient declines Can follow up in [place] clinic if patient desires  Deetta Perla, MD Neurosurgery

## 2020-12-30 NOTE — Discharge Summary (Signed)
Physician Discharge Summary  John Booker MVH:[id number] DOB: [birth year] DOA: 12/26/2020  PCP: Olin Hauser, DO  Admit date: 12/26/2020 Discharge date: 12/31/2020  Admitted From: home Disposition:  home  Recommendations for Outpatient Follow-up:  Follow up with PCP in 1-2 weeks Please obtain BMP/CBC in one week Please follow up on alcohol use disorder and consider medication to help with cessation Follow up with neurology and neurosurgery  Home Health: yes  Equipment/Devices: walker   Discharge Condition: stable  CODE STATUS: full  Diet recommendation: Dysphagia 3 - chopped  Discharge Diagnoses: Principal Problem:   Weakness Active Problems:   Essential hypertension   Alcohol abuse   Intractable hiccups   COVID-19 vaccination declined   Malnutrition of moderate degree   Esophageal dysmotility    Summary of HPI and Hospital Course:  "70 y.o. male with medical history significant for Alcohol abuse, hypertension, remote history of perirectal abscess in 2017, who presents emergency department for profound generalized weakness and increased urination.  Weakness has been slowly progressive in the setting of inadequate p.o. intake due to intractable hiccups and regurgitating of most solid foods.  He does okay with liquids or very soft foods such as ice cream or applesauce.  Prior EGD 05/25/2020 obtained for intractable hiccups, showed a small hiatal hernia.   Of note, patient's family report he drinks 12 to 16 ounces of bourbon daily, lives alone."  A&P:  Generalized weakness due to Inadequate oral intake Regurgitation of solid foods due to Esophageal Dysmotility Intractable hiccups Hiatal hernia Given Thorazine IV on admission per recommendation by neurology.  Responds well to combination of baclofen and thorazine.    PLAN -- --SLP consulted for swallow evaluation --GI consulted for EGD evaluation --Neurology consulted, appreciate input regarding intractable  hiccups.   --Dietitian consult --Off IV fluids and monitor PO hydration / intake  --Scheduled oral Thorazine and baclofen for hiccups --Uptitrate Thorazine if needed in follow up --CT abdomen pelvis as outpatient, per GI --Outpatient GI follow-up for manometry/motility study   Cervical myelopathy -MRI C-spine shows compressive myelopathy at C3-4's with central disc protrusion and also at the C5-6 level. Discussed findings with patient and he is very adamant that he would not accept surgery. -- Neurosurgery consult  --Patient declines any surgical intervention, instructed to follow up if he changes his mind   Generalized weakness -likely due in great part to the poor nutrition secondary to his hiccups and regurgitating foods.  PT recommended home health PT at discharge and rolling walker. Patient says he struggles most with getting to standing position for more service. --HH PT ordered, TOC consulted    Aspiration pneumonia - suspect with hiccups and frequent belching, and regurgitation patient possibly aspirated.  Chest x-ray obtained evening of 10/21 after he developed tachycardia showed new left airspace opacity consistent with pneumonia.   Patient denies any respiratory symptoms --Treated with Augmentin   Electrolyte abnormalities: Hypokalemia, hypomagnesemia, hyponatremia Calcium and magnesium normalized with replacement. Sodium improving, hyponatremia mild. --Monitor and replace as needed   AKI -present on admission with creatinine 1.45, compared to baseline 1.15 on 12/20/2020. Suspect prerenal azotemia due to poor p.o. intake and dehydration.  Treated with IV fluids. -- Follow BMP -- Renal U/S unremarkable    Alcohol use disorder -Per daughter, patient consumes about 12 to 16 ounces of bourbon daily. 10/21 PM -elevated heart rates from 114 up to 145 noted.  Suspect due to alcohol withdrawal. 10/22: Heart rate improved to 80s.   CIWA scores low but has  been given Ativan a  couple of times --Monitored with CIWA protocol & treated with PRN Ativan --Treated with IV thiamine, discharged PO thiamine  --Consider maintenance medication to reduce alcohol cravings in outpatient setting --TOC provided resources to assist with cessation   Vitamin D deficiency - started on supplementation --PCP follow up   Vitamin B12 deficiency - started on supplementation --PCP follow up   Essential hypertension -currently with soft blood pressure, with episodes of hypotension. Take lisinopril and HCTZ at home - held during admission with BP controlled. --Hold antihypertensives at discharge --Close PCP follow up to resume if indicated      Discharge Instructions   Discharge Instructions     Call MD for:   Complete by: As directed    Worsening hiccups Inability to keep down foods Worsening weakness or balance issues - concerning for worse pressure on the spinal cord in the neck.   Call MD for:  extreme fatigue   Complete by: As directed    Call MD for:  persistant dizziness or light-headedness   Complete by: As directed    Call MD for:  persistant nausea and vomiting   Complete by: As directed    Call MD for:  severe uncontrolled pain   Complete by: As directed    Call MD for:  temperature >100.4   Complete by: As directed    Diet - low sodium heart healthy   Complete by: As directed    Discharge instructions   Complete by: As directed    FOR HICCUPS --  We started Thorazine (chlorpromazine) TWICE daily - continue this as well as the Baclofen twice daily. Follow up with Primary Care or Neurology about refills for these, or if hiccups get worse again.  They can adjust the dose of the medicine.  Your hiccups might be related to the problems in your neck - you have discs bulging that push on your spinal cord, and this might be affecting the nerve to your diaphragm, causing hiccups.    If you change your mind about having surgery on your neck, let your doctor know and  you should see Neurosurgery.   ESOPHAGUS / SWALLOWING --  Follow up with GI in clinic.  You might have a condition called "achalasia" (just means your lower esophagus does not relax).  This can cause foods not to pass into the stomach, and they come back up.  During your upper endoscopy here in the hospital, the GI doctor dilated the bottom of your esophagus to help with this.   You should have a test done as outpatient called "esophgeal manometry" - it measures the pressure in the lower esophagus to help figure out if that is the problem.  You should eat soft foods, and/or chew your food really well, to prevent things from getting stuck and coming back up.  CERVICAL MYELOPATHY -- You have discs in your neck that are compressing your spinal cord.  Surgery is recommended for this to relieve the pressure on your spinal cord.  If you decide you would want surgery, please contact Neurosurgery (or have your Primary Care provider refer you there).   BLOOD PRESSURE ---  Your BP has been normal here on a small dose of Losartan.   I chose losartan instead of resuming your lisinopril because there is less risk of side effects or allergic reaction that lisinopril can cause (even when you've been on it a long time). You might need the dose increased or your prior medication (  HCTZ) restarted if your BP is running higher at home. Please follow up with Primary Care within 1-2 weeks.   --Dr. Nicole Kindred, DO   [facility]   Increase activity slowly   Complete by: As directed       Allergies as of 12/31/2020       Reactions   Adalat [nifedipine]    Foot swelling.    Amlodipine Other (See Comments)   Leg swelling        Medication List     STOP taking these medications    hydrochlorothiazide 25 MG tablet Commonly known as: HYDRODIURIL   lisinopril 40 MG tablet Commonly known as: ZESTRIL       TAKE these medications    amoxicillin-clavulanate 400-57 MG/5ML  suspension Commonly known as: AUGMENTIN Take 10 mLs (800 mg total) by mouth every 12 (twelve) hours for 3 days.   baclofen 10 MG tablet Commonly known as: LIORESAL Take 1 tablet (10 mg total) by mouth 2 (two) times daily.   chlorproMAZINE 10 MG tablet Commonly known as: THORAZINE Take 1 tablet (10 mg total) by mouth every 12 (twelve) hours.   cyanocobalamin 1000 MCG tablet Take 1 tablet (1,000 mcg total) by mouth daily.   feeding supplement Liqd Take 237 mLs by mouth 3 (three) times daily between meals.   losartan 25 MG tablet Commonly known as: COZAAR Take 1 tablet (25 mg total) by mouth daily.   multivitamin with minerals Tabs tablet Take 1 tablet by mouth daily.   saccharomyces boulardii 250 MG capsule Commonly known as: FLORASTOR Take 1 capsule (250 mg total) by mouth 2 (two) times daily.   sildenafil 20 MG tablet Commonly known as: REVATIO Take 1-5 pills about 30 min prior to sex. Start with 1 and increase as needed.   Vitamin D (Ergocalciferol) 1.25 MG (50000 UNIT) Caps capsule Commonly known as: DRISDOL Take 1 capsule (50,000 Units total) by mouth every 7 (seven) days. Start taking on: January 03, 2021               Durable Medical Equipment  (From admission, onward)           Start     Ordered   12/30/20 0807  For home use only DME Walker rolling  Once       Question Answer Comment  Walker: With [place]   Patient needs a walker to treat with the following condition Unsteady gait      12/30/20 0806   12/28/20 1659  For home use only DME Walker rolling  Once       Question Answer Comment  Walker: With [place] Wheels   Patient needs a walker to treat with the following condition Impaired ambulation      12/28/20 1658            Follow-up Information     Olin Hauser, DO. Schedule an appointment as soon as possible for a visit in 1 week(s).   Specialty: Family Medicine Why: Hospital Follow up Contact information: [place] [address] [phone]         Meade Maw, MD. Call.   Specialty: Neurosurgery Why: As needed if you want to pursue neck surgery to fix the compression on your spinal cord. Contact information: [place] [address] [phone]         Virgel Manifold, MD. Schedule an appointment as soon as possible for a visit.   Specialty:  Gastroenterology Why: Pt needs outpatient colonoscopy and manometry given EGD findings of possible achalasia. Contact information: [place] [address] [phone]                Allergies  Allergen Reactions   Adalat [Nifedipine]     Foot swelling.    Amlodipine Other (See Comments)    Leg swelling     If you experience worsening of your admission symptoms, develop shortness of breath, life threatening emergency, suicidal or homicidal thoughts you must seek medical attention immediately by calling 911 or calling your MD immediately  if symptoms less severe.    Please note   You were cared for by a hospitalist during your hospital stay. If you have any questions about your discharge medications or the care you received while you were in the hospital after you are discharged, you can call the unit and asked to speak with the hospitalist on call if the hospitalist that took care of you is not available. Once you are discharged, your primary care physician will handle any further medical issues. Please note that NO REFILLS for any discharge medications will be authorized once you are discharged, as it is imperative that you return to your primary care physician (or establish a relationship with a primary care physician if you do not have one) for your aftercare needs so that they can reassess your need for medications and monitor your lab values.   Consultations: Neurology  Neurosurgery   Procedures/Studies: DG Chest 2 View  Result Date: 12/26/2020 CLINICAL DATA:   Weakness. EXAM: CHEST - 2 VIEW COMPARISON:  Prior chest radiographs 03/03/2017 and earlier. FINDINGS: Heart size within normal limits. No appreciable airspace consolidation. No evidence of pleural effusion or pneumothorax. No acute bony abnormality identified. IMPRESSION: No evidence of active cardiopulmonary disease. Electronically Signed   By: Kellie Simmering D.O.   On: 12/26/2020 11:23   MR BRAIN WO CONTRAST  Result Date: 12/29/2020 CLINICAL DATA:  CN 9 neuropathy, bilateral arm and leg weakness with severe neck pain EXAM: MRI HEAD WITHOUT CONTRAST MRI CERVICAL SPINE WITHOUT CONTRAST TECHNIQUE: Multiplanar, multiecho pulse sequences of the brain and surrounding structures, and cervical spine, to include the craniocervical junction and cervicothoracic junction, were obtained without intravenous contrast. COMPARISON:  No prior MRI the head or cervical spine. Correlation is made with CT head 03/03/2017 FINDINGS: MRI HEAD FINDINGS Brain: No acute infarction, hemorrhage, hydrocephalus, extra-axial collection, or mass lesion. Mild central cerebral volume loss. No foci of hemosiderin deposition to suggest remote hemorrhage. Vascular: Normal flow voids. Skull and upper cervical spine: Normal marrow signal. Sinuses/Orbits: Negative. Other: The mastoids are well aerated. MRI CERVICAL SPINE FINDINGS Alignment: Straightening of the normal cervical lordosis. No significant listhesis. Vertebrae: Multilevel endplate degenerative changes. No fracture, evidence discitis, or osseous lesion. Congenitally short pedicles, which narrow the AP diameter of the spinal canal. Cord: Increased T2 signal within the entire cross-section spinal cord at C3-C4 (series 9, image 8 and series 12, image 10-11), secondary to focal compression. Additional punctate foci of increased T2 signal in the spinal cord at C5-C6 (series 9, image 9 and series 12, image 20), left greater than right. No evidence of cord expansion at either level. Posterior  Fossa, vertebral arteries, paraspinal tissues: Negative. Disc levels: C2-C3: Central disc protrusion. No spinal canal stenosis or neural foraminal narrowing. C3-C4: Large central disc protrusion which indents and deforms the spinal cord, which demonstrates increased T2 signal. Severe spinal canal stenosis. Facet arthropathy. Moderate bilateral neural  foraminal narrowing. C4-C5: Disc bulge with superimposed right foraminal protrusion. Facet and uncovertebral hypertrophy. Mild spinal canal stenosis. Severe right neural foraminal narrowing. C5-C6: Broad-based disc bulge, which abuts the ventral surface of the cord. Uncovertebral and facet arthropathy. Moderate spinal canal stenosis. Moderate to severe bilateral neural foraminal narrowing C6-C7: Broad-based disc bulge. Facet and uncovertebral hypertrophy. No spinal canal stenosis. Severe right and mild left neural foraminal narrowing. C7-T1: No significant disc bulge. No neural foraminal narrowing or spinal canal stenosis. IMPRESSION: 1. 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 In addition there is moderate bilateral neural foraminal narrowing at this level. 2. Additional more focal increased T2 signal within the left-greater-than-right spinal cord at C5-C6, which may also be myelopathy secondary to compression. There is moderate spinal canal stenosis and moderate to severe bilateral neural foraminal narrowing at this level. 3. C4-C5 mild spinal canal stenosis and severe right neural foraminal narrowing. 4. C6-C7 severe right and mild left neural foraminal narrowing. 5. No acute intracranial process. Electronically Signed   By: Merilyn Baba M.D.   On: 12/29/2020 20:46   MR CERVICAL SPINE WO CONTRAST  Result Date: 12/29/2020 CLINICAL DATA:  CN 9 neuropathy, bilateral arm and leg weakness with severe neck pain EXAM: MRI  HEAD WITHOUT CONTRAST MRI CERVICAL SPINE WITHOUT CONTRAST TECHNIQUE: Multiplanar, multiecho pulse sequences of the brain and surrounding structures, and cervical spine, to include the craniocervical junction and cervicothoracic junction, were obtained without intravenous contrast. COMPARISON:  No prior MRI the head or cervical spine. Correlation is made with CT head 03/03/2017 FINDINGS: MRI HEAD FINDINGS Brain: No acute infarction, hemorrhage, hydrocephalus, extra-axial collection, or mass lesion. Mild central cerebral volume loss. No foci of hemosiderin deposition to suggest remote hemorrhage. Vascular: Normal flow voids. Skull and upper cervical spine: Normal marrow signal. Sinuses/Orbits: Negative. Other: The mastoids are well aerated. MRI CERVICAL SPINE FINDINGS Alignment: Straightening of the normal cervical lordosis. No significant listhesis. Vertebrae: Multilevel endplate degenerative changes. No fracture, evidence discitis, or osseous lesion. Congenitally short pedicles, which narrow the AP diameter of the spinal canal. Cord: Increased T2 signal within the entire cross-section spinal cord at C3-C4 (series 9, image 8 and series 12, image 10-11), secondary to focal compression. Additional punctate foci of increased T2 signal in the spinal cord at C5-C6 (series 9, image 9 and series 12, image 20), left greater than right. No evidence of cord expansion at either level. Posterior Fossa, vertebral arteries, paraspinal tissues: Negative. Disc levels: C2-C3: Central disc protrusion. No spinal canal stenosis or neural foraminal narrowing. C3-C4: Large central disc protrusion which indents and deforms the spinal cord, which demonstrates increased T2 signal. Severe spinal canal stenosis. Facet arthropathy. Moderate bilateral neural foraminal narrowing. C4-C5: Disc bulge with superimposed right foraminal protrusion. Facet and uncovertebral hypertrophy. Mild spinal canal stenosis. Severe right neural foraminal narrowing.  C5-C6: Broad-based disc bulge, which abuts the ventral surface of the cord. Uncovertebral and facet arthropathy. Moderate spinal canal stenosis. Moderate to severe bilateral neural foraminal narrowing C6-C7: Broad-based disc bulge. Facet and uncovertebral hypertrophy. No spinal canal stenosis. Severe right and mild left neural foraminal narrowing. C7-T1: No significant disc bulge. No neural foraminal narrowing or spinal canal stenosis. IMPRESSION: 1. 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 In addition there is moderate bilateral neural foraminal narrowing  at this level. 2. Additional more focal increased T2 signal within the left-greater-than-right spinal cord at C5-C6, which may also be myelopathy secondary to compression. There is moderate spinal canal stenosis and moderate to severe bilateral neural foraminal narrowing at this level. 3. C4-C5 mild spinal canal stenosis and severe right neural foraminal narrowing. 4. C6-C7 severe right and mild left neural foraminal narrowing. 5. No acute intracranial process. Electronically Signed   By: Merilyn Baba M.D.   On: 12/29/2020 20:46   US RENAL  Result Date: 12/27/2020 CLINICAL DATA:  Acute kidney injury EXAM: RENAL / URINARY TRACT ULTRASOUND COMPLETE COMPARISON:  None. FINDINGS: Right Kidney: Renal measurements: 10.5 x 4.8 x 4.3 cm = volume: 114 mL. Echogenicity within normal limits. No mass or hydronephrosis visualized. Left Kidney: Renal measurements: 10.6 x 5.4 x 4.8 cm = volume: 142 mL. Echogenicity within normal limits. No mass or hydronephrosis visualized. Bladder: Unable to evaluate due to decompression. Other: Diffuse increased echogenicity of the visualized portions of the hepatic parenchyma are a nonspecific indicator of hepatocellular dysfunction, most commonly steatosis. IMPRESSION: No significant  sonographic abnormality of the kidneys. Electronically Signed   By: Miachel Roux M.D.   On: 12/27/2020 16:41   DG Chest Port 1 View  Result Date: 12/28/2020 CLINICAL DATA:  Fever EXAM: PORTABLE CHEST 1 VIEW COMPARISON:  02/21/2017, 12/26/2020 FINDINGS: Interim development of patchy airspace disease in the left thorax. Probable small left effusion. Normal cardiac size. No pneumothorax IMPRESSION: Interim development of patchy airspace opacity in the left thorax suspicious for pneumonia. Small left effusion Electronically Signed   By: Donavan Foil M.D.   On: 12/28/2020 19:41   DG ESOPHAGUS W SINGLE CM (SOL OR THIN BA)  Result Date: 12/27/2020 CLINICAL DATA:  Regurgitation of food EXAM: ESOPHAGUS/BARIUM SWALLOW/TABLET STUDY TECHNIQUE: Combined double and single contrast examination was performed using effervescent crystals, high-density barium, and thin liquid barium. FLUOROSCOPY TIME:  Radiation Exposure Index (as provided by the fluoroscopic device): 3.4 minutes If the device does not provide the exposure index: Fluoroscopy Time:  59.8 mGy Number of Acquired Images:  5 COMPARISON:  NONE. FINDINGS: The patient could not stand up straight, roll up onto his side, or go into the prone position. Images were obtained in the AP and RPO projection. Findings are made within these confines. Swallowing: The patient swallowed thick barium without difficulty. Pharynx: Unremarkable. Esophagus: The esophagus was moderately dilated. No fixed stricture or mucosal lesion was identified. Esophageal motility: There is marked esophageal dysmotility with transient appearance of esophageal spasm. The lower esophageal sphincter transiently opened a small amount one single time, but was not seen to clearly open and relax despite several minutes of waiting with intermittent fluoro. Hiatal Hernia: None. Gastroesophageal reflux: None visualized. Provocative maneuvers were not performed. Ingested 70mm barium tablet: A tablet was not  given as held up contrast was obscuring the GE junction. Other: None. IMPRESSION: 1. The lower esophageal frank tear was not seen to clearly relax and open during the study causing significant holdup of barium in the lower esophagus. This raises suspicion for an obstructing mass at the GE junction or possibly achalasia, though the typical bird's beak appearance of achalasia is not seen. Repeat study on an outpatient basis when the patient can tolerate different positions and standing upright may be useful for further evaluation. Endoscopy is also recommended for direct visualization of the GE junction. 2. Esophageal dysmotility with an appearance suggestive of intermittent esophageal spasm. Electronically Signed   By: Court Joy.D.  On: 12/27/2020 16:40     Barium swallow study on 12/27/2020 --  The lower esophageal sphincter was not seen to clearly relax and open during the study causing significant holdup of barium in the lower esophagus. This raises suspicion for an obstructing mass at the GE junction or possibly achalasia, though the typical bird's beak appearance of achalasia is not seen. Repeat study on an outpatient basis when the patient can tolerate different positions and standing upright may be useful for further evaluation. Endoscopy is also recommended for direct visualization of the GE junction Esophageal dysmotility with an appearance suggestive of intermittent esophageal spasm.   EGD on 12/28/2020 ---   - Duodenal erosions without bleeding. Biopsied. - Gastritis. Biopsied. - Small hiatal hernia. - Z-line regular, 40 cm from the incisors. - Esophagogastric landmarks identified. - Abnormal esophageal motility. Dilated. - Normal mucosa was found in the entire esophagus.     Subjective: Pt feels well.  Eager to go home.  Again declines surgery for his neck despite that it may improve hiccups and some of this issues with weakness.  Just wants to be home and out of hospital.  Hiccups  doing better, but do come back shortly before next dose of medication.   Discharge Exam: Vitals:   12/31/20 0034 12/31/20 0807  BP: 132/71 138/86  Pulse: 79 80  Resp: 18 16  Temp: 99 F (37.2 C) 98.1 F (36.7 C)  SpO2: 98% 97%   Vitals:   12/30/20 0809 12/30/20 2139 12/31/20 0034 12/31/20 0807  BP: 127/63 101/70 132/71 138/86  Pulse: 76 80 79 80  Resp: 16 17 18 16   Temp: 97.9 F (36.6 C) 98.6 F (37 C) 99 F (37.2 C) 98.1 F (36.7 C)  TempSrc: Oral Oral Oral Oral  SpO2: 97% 97% 98% 97%  Weight:      Height:        General: Pt is alert, awake, not in acute distress Cardiovascular: RRR, S1/S2 +, no rubs, no gallops Respiratory: CTA bilaterally, no wheezing, no rhonchi Abdominal: Soft, NT, ND, bowel sounds + Extremities: no edema, no cyanosis    The results of significant diagnostics from this hospitalization (including imaging, microbiology, ancillary and laboratory) are listed below for reference.     Microbiology: Recent Results (from the past 240 hour(s))  Resp Panel by RT-PCR (Flu A&B, Covid) Nasopharyngeal Swab     Status: None   Collection Time: 12/26/20  1:03 PM   Specimen: Nasopharyngeal Swab; Nasopharyngeal(NP) swabs in vial transport medium  Result Value Ref Range Status   SARS Coronavirus 2 by RT PCR NEGATIVE NEGATIVE Final    Comment: (NOTE) SARS-CoV-2 target nucleic acids are NOT DETECTED.  The SARS-CoV-2 RNA is generally detectable in upper respiratory specimens during the acute phase of infection. The lowest concentration of SARS-CoV-2 viral copies this assay can detect is 138 copies/mL. A negative result does not preclude SARS-Cov-2 infection and should not be used as the sole basis for treatment or other patient management decisions. A negative result may occur with  improper specimen collection/handling, submission of specimen other than nasopharyngeal swab, presence of viral mutation(s) within the areas targeted by this assay, and inadequate  number of viral copies(<138 copies/mL). A negative result must be combined with clinical observations, patient history, and epidemiological information. The expected result is Negative.  Fact Sheet for Patients:  EntrepreneurPulse.com.au  Fact Sheet for Healthcare Providers:  IncredibleEmployment.be  This test is no t yet approved or cleared by the [place] and  has been authorized for detection and/or diagnosis of SARS-CoV-2 by FDA under an Emergency Use Authorization (EUA). This EUA will remain  in effect (meaning this test can be used) for the duration of the COVID-19 declaration under Section 564(b)(1) of the Act, 21 U.S.C.section 360bbb-3(b)(1), unless the authorization is terminated  or revoked sooner.       Influenza A by PCR NEGATIVE NEGATIVE Final   Influenza B by PCR NEGATIVE NEGATIVE Final    Comment: (NOTE) The Xpert Xpress SARS-CoV-2/FLU/RSV plus assay is intended as an aid in the diagnosis of influenza from Nasopharyngeal swab specimens and should not be used as a sole basis for treatment. Nasal washings and aspirates are unacceptable for Xpert Xpress SARS-CoV-2/FLU/RSV testing.  Fact Sheet for Patients: EntrepreneurPulse.com.au  Fact Sheet for Healthcare Providers: IncredibleEmployment.be  This test is not yet approved or cleared by the [place] FDA and has been authorized for detection and/or diagnosis of SARS-CoV-2 by FDA under an Emergency Use Authorization (EUA). This EUA will remain in effect (meaning this test can be used) for the duration of the COVID-19 declaration under Section 564(b)(1) of the Act, 21 U.S.C. section 360bbb-3(b)(1), unless the authorization is terminated or revoked.  Performed at [facility], [address] [place], [place] [address]      Labs: BNP (last 3 results) Recent Labs    12/26/20 0819  BNP 009.3*   Basic Metabolic  Panel: Recent Labs  Lab 12/26/20 1155 12/26/20 1629 12/27/20 0452 12/28/20 0239 12/29/20 0455 12/30/20 0500 12/31/20 0500  NA  --   --  132* 132* 134* 134* 135  K  --   --  3.3* 3.4* 4.0 3.8 3.6  CL  --   --  97* 100 100 100 95*  CO2  --   --  27 26 29  32 27  GLUCOSE  --   --  85 93 90 106* 103*  BUN  --   --  23 24* 16 15 18   CREATININE  --   --  1.52* 1.13 1.11 0.94 0.98  CALCIUM  --   --  8.4* 8.1* 8.1* 8.5* 8.6*  MG  --  1.5*  --  1.7 1.7 1.8  --   PHOS 2.5  --   --   --   --   --   --    Liver Function Tests: Recent Labs  Lab 12/26/20 0819 12/29/20 0455  AST 98* 38  ALT 27 25  ALKPHOS 78 45  BILITOT 1.8* 1.2  PROT 7.0 5.4*  ALBUMIN 3.7 2.8*   Recent Labs  Lab 12/26/20 0819  LIPASE 26   No results for input(s): AMMONIA in the last 168 hours. CBC: Recent Labs  Lab 12/26/20 0819 12/27/20 0452 12/28/20 0239 12/29/20 0455 12/30/20 0500  WBC 7.5 4.0 3.7* 8.1 6.7  HGB 15.9 13.9 12.6* 13.4 12.8*  HCT 46.9 40.2 36.7* 39.5 38.1*  MCV 98.7 100.8* 98.9 99.2 101.9*  PLT 157 129* 120* 132* 147*   Cardiac Enzymes: No results for input(s): CKTOTAL, CKMB, CKMBINDEX, TROPONINI in the last 168 hours. BNP: Invalid input(s): POCBNP CBG: No results for input(s): GLUCAP in the last 168 hours. D-Dimer No results for input(s): DDIMER in the last 72 hours. Hgb A1c No results for input(s): HGBA1C in the last 72 hours. Lipid Profile No results for input(s): CHOL, HDL, LDLCALC, TRIG, CHOLHDL, LDLDIRECT in the last 72 hours. Thyroid function studies No results for input(s): TSH, T4TOTAL, T3FREE, THYROIDAB in the last 72 hours.  Invalid input(s): FREET3 Anemia work up No results for input(s): VITAMINB12, FOLATE, FERRITIN, TIBC, IRON, RETICCTPCT in the last 72 hours. Urinalysis    Component Value Date/Time   COLORURINE AMBER (A) 12/26/2020 1155   APPEARANCEUR HAZY (A) 12/26/2020 1155   LABSPEC 1.019 12/26/2020 1155   PHURINE 5.0 12/26/2020 1155   GLUCOSEU NEGATIVE  12/26/2020 1155   HGBUR MODERATE (A) 12/26/2020 1155   BILIRUBINUR NEGATIVE 12/26/2020 1155   KETONESUR 5 (A) 12/26/2020 1155   PROTEINUR 30 (A) 12/26/2020 1155   NITRITE NEGATIVE 12/26/2020 1155   LEUKOCYTESUR NEGATIVE 12/26/2020 1155   Sepsis Labs Invalid input(s): PROCALCITONIN,  WBC,  LACTICIDVEN Microbiology Recent Results (from the past 240 hour(s))  Resp Panel by RT-PCR (Flu A&B, Covid) Nasopharyngeal Swab     Status: None   Collection Time: 12/26/20  1:03 PM   Specimen: Nasopharyngeal Swab; Nasopharyngeal(NP) swabs in vial transport medium  Result Value Ref Range Status   SARS Coronavirus 2 by RT PCR NEGATIVE NEGATIVE Final    Comment: (NOTE) SARS-CoV-2 target nucleic acids are NOT DETECTED.  The SARS-CoV-2 RNA is generally detectable in upper respiratory specimens during the acute phase of infection. The lowest concentration of SARS-CoV-2 viral copies this assay can detect is 138 copies/mL. A negative result does not preclude SARS-Cov-2 infection and should not be used as the sole basis for treatment or other patient management decisions. A negative result may occur with  improper specimen collection/handling, submission of specimen other than nasopharyngeal swab, presence of viral mutation(s) within the areas targeted by this assay, and inadequate number of viral copies(<138 copies/mL). A negative result must be combined with clinical observations, patient history, and epidemiological information. The expected result is Negative.  Fact Sheet for Patients:  EntrepreneurPulse.com.au  Fact Sheet for Healthcare Providers:  IncredibleEmployment.be  This test is no t yet approved or cleared by the [place] FDA and  has been authorized for detection and/or diagnosis of SARS-CoV-2 by FDA under an Emergency Use Authorization (EUA). This EUA will remain  in effect (meaning this test can be used) for the duration of the COVID-19  declaration under Section 564(b)(1) of the Act, 21 U.S.C.section 360bbb-3(b)(1), unless the authorization is terminated  or revoked sooner.       Influenza A by PCR NEGATIVE NEGATIVE Final   Influenza B by PCR NEGATIVE NEGATIVE Final    Comment: (NOTE) The Xpert Xpress SARS-CoV-2/FLU/RSV plus assay is intended as an aid in the diagnosis of influenza from Nasopharyngeal swab specimens and should not be used as a sole basis for treatment. Nasal washings and aspirates are unacceptable for Xpert Xpress SARS-CoV-2/FLU/RSV testing.  Fact Sheet for Patients: EntrepreneurPulse.com.au  Fact Sheet for Healthcare Providers: IncredibleEmployment.be  This test is not yet approved or cleared by the [place] FDA and has been authorized for detection and/or diagnosis of SARS-CoV-2 by FDA under an Emergency Use Authorization (EUA). This EUA will remain in effect (meaning this test can be used) for the duration of the COVID-19 declaration under Section 564(b)(1) of the Act, 21 U.S.C. section 360bbb-3(b)(1), unless the authorization is terminated or revoked.  Performed at [facility], [address] [place], [place] [address]      Time coordinating discharge: Over 30 minutes  SIGNED:   Ezekiel Slocumb, DO [facility] 12/31/2020, 10:44 AM   If 7PM-7AM, please contact night-coverage [email or web address]

## 2020-12-30 NOTE — Progress Notes (Addendum)
Neurology Progress Note  Patient ID: John Booker is a 70 y.o. with past medical history significant for hypertension, ongoing daily alcohol use, remote perirectal abscess (2017)  Initially consulted for: Hiccups  Major interval events/Subjective: -On Augmentin for pneumonia -VSS   Exam: Vitals:   12/30/20 0422 12/30/20 0809  BP: 128/66 127/63  Pulse: 80 76  Resp: 16 16  Temp: 98.2 F (36.8 C) 97.9 F (36.6 C)  SpO2: 97% 97%   Gen: In bed, comfortable, no significant hiccups Resp: non-labored breathing, no grossly audible wheezing Cardiac: Perfusing extremities well  Abd: soft, nt Psych: Affect remains flat with some irritability but redirectable   Neuro: MS: Conversant, follows commands, remembers conversation with neurosurgeon CN: EOMI, face symmetric Motor: 5/5 throughout upper extremities, continues to have mild hip flexor weakness left greater than right (4 on the left, 4+ on the right), otherwise 5/5 throughout the lower extremities DTR: Remain 3+ in the biceps, brachioradialis and patellas  Pertinent Labs:  Basic Metabolic Panel: Recent Labs  Lab 12/26/20 0819 12/26/20 1155 12/26/20 1629 12/27/20 0452 12/28/20 0239 12/29/20 0455 12/30/20 0500  NA 129*  --   --  132* 132* 134* 134*  K 4.1  --   --  3.3* 3.4* 4.0 3.8  CL 91*  --   --  97* 100 100 100  CO2 26  --   --  27 26 29  32  GLUCOSE 103*  --   --  85 93 90 106*  BUN 22  --   --  23 24* 16 15  CREATININE 1.45*  --   --  1.52* 1.13 1.11 0.94  CALCIUM 8.9  --   --  8.4* 8.1* 8.1* 8.5*  MG  --   --  1.5*  --  1.7 1.7 1.8  PHOS  --  2.5  --   --   --   --   --      CBC: Recent Labs  Lab 12/26/20 0819 12/27/20 0452 12/28/20 0239 12/29/20 0455 12/30/20 0500  WBC 7.5 4.0 3.7* 8.1 6.7  HGB 15.9 13.9 12.6* 13.4 12.8*  HCT 46.9 40.2 36.7* 39.5 38.1*  MCV 98.7 100.8* 98.9 99.2 101.9*  PLT 157 129* 120* 132* 147*     Coagulation Studies: No results for input(s): LABPROT, INR in the last 72  hours.    Results for John Booker, John Booker (MRN [id number]) as of 12/30/2020 08:17  Ref. Range 12/26/2020 15:27 12/26/2020 16:29 12/27/2020 11:10  Copper Latest Ref Range: 69 - 132 ug/dL  117   Vitamin B1 (Thiamine) Latest Ref Range: 66.5 - 200.0 nmol/L 95.0    Vitamin D, 25-Hydroxy Latest Ref Range: 30 - 100 ng/mL  14.56 (L)   Vitamin B12 Latest Ref Range: 180 - 914 pg/mL 350    Vitamin B6 Latest Ref Range: 3.4 - 65.2 ug/L   4.8  Zinc Latest Ref Range: 44 - 115 ug/dL  40 (L)    MRI brain and C-spine 10/22 personally reviewed, agree with radiology:  1. 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 In addition there is moderate bilateral neural foraminal narrowing at this level. 2. Additional more focal increased T2 signal within the left-greater-than-right spinal cord at C5-C6, which may also be myelopathy secondary to compression. There is moderate spinal canal stenosis and moderate to severe bilateral neural foraminal narrowing at this level. 3. C4-C5  mild spinal canal stenosis and severe right neural foraminal narrowing. 4. C6-C7 severe right and mild left neural foraminal narrowing. 5. No acute intracranial process.  Imaging reviewed with the patient  Impression: Severe spinal cord stenosis at C3-C4, with an additional lesion at C5-C6.  This would explain patient's myelopathic examination, and given the phrenic nerve arises from C3-C5 spinal nerve roots, may also be playing a role in his hiccups.  Appreciate neurosurgical evaluation.  Reviewed significant risk of further decompensation including progression to quadriplegia with the patient, especially in the setting of ongoing falls at home.  He was able to express his understanding of the same.  Recommendations: -Continue Thorazine every 12 hours 10 mg, if hiccups remain uncontrolled may  gradually uptitrate as needed -Continue baclofen 10 mg twice daily -Appreciate neurosurgery evaluation -Appreciate management of pneumonia and other comorbidities per primary team -Neurology will be available on an as-needed basis going forward, please reach out if new questions or concerns arise  Lesleigh Noe MD-PhD [facility] [phone]   Greater than 25 minutes were spent in care of this patient today, of which greater than 50% was at bedside

## 2020-12-30 NOTE — Progress Notes (Signed)
PROGRESS NOTE    John Booker   EVO:[id number]  DOB: 07-Jul-1950  PCP: Olin Hauser, DO    DOA: 12/26/2020 LOS: 2    Brief Narrative / Hospital Course to Date:   70 y.o. male with medical history significant for Alcohol abuse, hypertension, remote history of perirectal abscess in 2017, who presents emergency department for profound generalized weakness and increased urination.  Weakness has been slowly progressive in the setting of inadequate p.o. intake due to intractable hiccups and regurgitating of most solid foods.  He does okay with liquids or very soft foods such as ice cream or applesauce.  Prior EGD 05/25/2020 obtained for intractable hiccups, showed a small hiatal hernia.  Of note, patient's family report he drinks 12 to 16 ounces of bourbon daily, lives alone.  Assessment & Plan   Principal Problem:   Weakness Active Problems:   Essential hypertension   Alcohol abuse   Intractable hiccups   COVID-19 vaccination declined   Malnutrition of moderate degree   Esophageal dysmotility   Generalized weakness due to Inadequate oral intake Regurgitation of solid foods due to Esophageal Dysmotility Intractable hiccups Hiatal hernia Given Thorazine IV on admission per recommendation by neurology.  Responds well to combination of baclofen and thorazine.   PLAN -- --SLP consulted for swallow evaluation --GI consulted for EGD evaluation --Neurology consulted, appreciate input regarding intractable hiccups.   --Dietitian consult --Off IV fluids and monitor PO hydration / intake  --Scheduled oral Thorazine and baclofen for hiccups --Uptitrate Thorazine if needed --CT abdomen pelvis as outpatient, per GI --Outpatient GI follow-up for manometry/motility study  Cervical myelopathy -MRI C-spine shows compressive myelopathy at C3-4's with central disc protrusion and also at the C5-6 level. Discussed findings with patient and he is pretty adamant that he would not  accept surgery. -- Neurosurgery consult pending  Generalized weakness -likely due in great part to the poor nutrition secondary to his hiccups and regurgitating foods.  PT recommended home health PT at discharge and rolling walker. Patient says he struggles most with getting to standing position for more service. --HH PT ordered, TOC consulted --Continue PT while admitted  ?  Aspiration pneumonia -suspect with hiccups and frequent belching, and regurgitation patient possibly aspirated.  Chest x-ray obtained evening of 10/21 after he developed tachycardia showed new left airspace opacity consistent with pneumonia.   Patient denies any respiratory symptoms --Continue Augmentin --Monitor clinically --Supplement oxygen if sats drop below 90% --Add supportive care if you become symptomatic  Electrolyte abnormalities: Hypokalemia, hypomagnesemia, hyponatremia Calcium and magnesium normalized with replacement. Sodium improving, hyponatremia mild. --Monitor and replace as needed --Encourage PO intake / hydration  AKI -present on admission with creatinine 1.45, compared to baseline 1.15 on 12/20/2020. Suspect prerenal azotemia due to poor p.o. intake and dehydration. -- Resume maintenance IV fluids given persistent intolerance to p.o. intake -- Follow BMP -- Renal ultrasound unremarkable   Alcohol use disorder -Per daughter, patient consumes about 12 to 16 ounces of bourbon daily. 10/21 PM -elevated heart rates from 114 up to 145 noted.  Suspect due to withdrawal developing. 10/22: Heart rate improved to 80s.  CIWA scores low but has been given Ativan a couple of times --CIWA protocol with as needed Ativan --IV thiamine, PO thiamine at d/c --Monitor and replace electrolytes --Consider maintenance medication to reduce alcohol cravings in outpatient setting --TOC consult to provide resources to assist with cessation  Vitamin D deficiency - started on supplementation --PCP follow  up  Vitamin B12 deficiency -  started on supplementation --PCP follow up  Essential hypertension -currently with soft blood pressure, sewed of hypotension. Take lisinopril and HCTZ at home. --Hold antihypertensives for now --Monitor BP and resume therapy and when indicated  Continue home baclofen -indication unclear but possibly started for intractable hiccups  Patient BMI: Body mass index is 25.35 kg/m.   DVT prophylaxis: enoxaparin (LOVENOX) injection 40 mg Start: 12/26/20 2200 Place TED hose Start: 12/26/20 1355   Diet:  Diet Orders (From admission, onward)     Start     Ordered   12/29/20 1050  DIET DYS 3 Room service appropriate? Yes; Fluid consistency: Thin  Diet effective now       Question Answer Comment  Room service appropriate? Yes   Fluid consistency: Thin      12/29/20 1049              Code Status: Full Code   Subjective 12/30/20    Patient awake laying in bed when seen this morning.  He reports overall hiccups improved but they do come back when medicine starts wearing off.  We discussed the findings of his cervical spine MRI and myelopathy likely surgery would be recommended for.  He says he absolutely will not have surgery on his neck because he has heard too many nightmare stories about back and neck surgeries.  He reports significant difficulty with getting himself up to stand from a low surface but does okay once he is up using the walker.  Has some diarrhea today after diet was resumed yesterday and reports having this on and off chronically.  No other acute complaints.   Disposition Plan & Communication   Status is: Inpatient  Remains inpatient appropriate because: potential need for neurosurgical intervention, pending neurosurgical consult   Family Communication: Daughter updated at bedside on 10/21   Consults, Procedures, Significant Events   Consultants:  Neurology  Procedures:  Barium swallow study on 12/27/2020 --  The lower  esophageal sphincter was not seen to clearly relax and open during the study causing significant holdup of barium in the lower esophagus. This raises suspicion for an obstructing mass at the GE junction or possibly achalasia, though the typical bird's beak appearance of achalasia is not seen. Repeat study on an outpatient basis when the patient can tolerate different positions and standing upright may be useful for further evaluation. Endoscopy is also recommended for direct visualization of the GE junction Esophageal dysmotility with an appearance suggestive of intermittent esophageal spasm.  EGD on 12/28/2020 ---   - Duodenal erosions without bleeding. Biopsied. - Gastritis. Biopsied. - Small hiatal hernia. - Z-line regular, 40 cm from the incisors. - Esophagogastric landmarks identified. - Abnormal esophageal motility. Dilated. - Normal mucosa was found in the entire esophagus.     Antimicrobials:  Anti-infectives (From admission, onward)    Start     Dose/Rate Route Frequency Ordered Stop   12/29/20 1000  amoxicillin-clavulanate (AUGMENTIN) 875-125 MG per tablet 1 tablet  Status:  Discontinued        1 tablet Oral Every 12 hours 12/29/20 0810 12/29/20 0813   12/29/20 1000  amoxicillin-clavulanate (AUGMENTIN) 400-57 MG/5ML suspension 800 mg        800 mg Oral Every 12 hours 12/29/20 0814 01/03/21 0959         Micro    Objective   Vitals:   12/29/20 2116 12/30/20 0422 12/30/20 0500 12/30/20 0809  BP: 123/66 128/66  127/63  Pulse: 88 80  76  Resp: 20 16  16  Temp: 98.3 F (36.8 C) 98.2 F (36.8 C)  97.9 F (36.6 C)  TempSrc:  Oral  Oral  SpO2: 96% 97%  97%  Weight:   92 kg   Height:        Intake/Output Summary (Last 24 hours) at 12/30/2020 1505 Last data filed at 12/30/2020 1429 Gross per 24 hour  Intake 717 ml  Output 1900 ml  Net -1183 ml   Filed Weights   12/26/20 0812 12/29/20 0349 12/30/20 0500  Weight: 83.9 kg 90.4 kg 92 kg    Physical  Exam:  General exam: awake laying in bed, alert, no acute distress, few infrequent hiccups Respiratory system: normal respiratory effort, on room air Cardiovascular system: RRR, S1/S2+ Gastrointestinal system: Abdomen soft and nontender Central nervous system: A&O x3. no gross focal neurologic deficits, normal speech Psychiatric: normal mood, flat affect  Labs   Data Reviewed: I have personally reviewed following labs and imaging studies  CBC: Recent Labs  Lab 12/26/20 0819 12/27/20 0452 12/28/20 0239 12/29/20 0455 12/30/20 0500  WBC 7.5 4.0 3.7* 8.1 6.7  HGB 15.9 13.9 12.6* 13.4 12.8*  HCT 46.9 40.2 36.7* 39.5 38.1*  MCV 98.7 100.8* 98.9 99.2 101.9*  PLT 157 129* 120* 132* 419*   Basic Metabolic Panel: Recent Labs  Lab 12/26/20 0819 12/26/20 1155 12/26/20 1629 12/27/20 0452 12/28/20 0239 12/29/20 0455 12/30/20 0500  NA 129*  --   --  132* 132* 134* 134*  K 4.1  --   --  3.3* 3.4* 4.0 3.8  CL 91*  --   --  97* 100 100 100  CO2 26  --   --  27 26 29  32  GLUCOSE 103*  --   --  85 93 90 106*  BUN 22  --   --  23 24* 16 15  CREATININE 1.45*  --   --  1.52* 1.13 1.11 0.94  CALCIUM 8.9  --   --  8.4* 8.1* 8.1* 8.5*  MG  --   --  1.5*  --  1.7 1.7 1.8  PHOS  --  2.5  --   --   --   --   --    GFR: Estimated Creatinine Clearance: 86.1 mL/min (by C-G formula based on SCr of 0.94 mg/dL). Liver Function Tests: Recent Labs  Lab 12/26/20 0819 12/29/20 0455  AST 98* 38  ALT 27 25  ALKPHOS 78 45  BILITOT 1.8* 1.2  PROT 7.0 5.4*  ALBUMIN 3.7 2.8*   Recent Labs  Lab 12/26/20 0819  LIPASE 26   No results for input(s): AMMONIA in the last 168 hours. Coagulation Profile: No results for input(s): INR, PROTIME in the last 168 hours. Cardiac Enzymes: No results for input(s): CKTOTAL, CKMB, CKMBINDEX, TROPONINI in the last 168 hours. BNP (last 3 results) No results for input(s): PROBNP in the last 8760 hours. HbA1C: No results for input(s): HGBA1C in the last 72  hours. CBG: No results for input(s): GLUCAP in the last 168 hours. Lipid Profile: No results for input(s): CHOL, HDL, LDLCALC, TRIG, CHOLHDL, LDLDIRECT in the last 72 hours. Thyroid Function Tests: No results for input(s): TSH, T4TOTAL, FREET4, T3FREE, THYROIDAB in the last 72 hours.  Anemia Panel: No results for input(s): VITAMINB12, FOLATE, FERRITIN, TIBC, IRON, RETICCTPCT in the last 72 hours.  Sepsis Labs: No results for input(s): PROCALCITON, LATICACIDVEN in the last 168 hours.  Recent Results (from the past 240 hour(s))  Resp Panel by RT-PCR (Flu A&B, Covid)  Nasopharyngeal Swab     Status: None   Collection Time: 12/26/20  1:03 PM   Specimen: Nasopharyngeal Swab; Nasopharyngeal(NP) swabs in vial transport medium  Result Value Ref Range Status   SARS Coronavirus 2 by RT PCR NEGATIVE NEGATIVE Final    Comment: (NOTE) SARS-CoV-2 target nucleic acids are NOT DETECTED.  The SARS-CoV-2 RNA is generally detectable in upper respiratory specimens during the acute phase of infection. The lowest concentration of SARS-CoV-2 viral copies this assay can detect is 138 copies/mL. A negative result does not preclude SARS-Cov-2 infection and should not be used as the sole basis for treatment or other patient management decisions. A negative result may occur with  improper specimen collection/handling, submission of specimen other than nasopharyngeal swab, presence of viral mutation(s) within the areas targeted by this assay, and inadequate number of viral copies(<138 copies/mL). A negative result must be combined with clinical observations, patient history, and epidemiological information. The expected result is Negative.  Fact Sheet for Patients:  EntrepreneurPulse.com.au  Fact Sheet for Healthcare Providers:  IncredibleEmployment.be  This test is no t yet approved or cleared by the [place] FDA and  has been authorized for detection and/or  diagnosis of SARS-CoV-2 by FDA under an Emergency Use Authorization (EUA). This EUA will remain  in effect (meaning this test can be used) for the duration of the COVID-19 declaration under Section 564(b)(1) of the Act, 21 U.S.C.section 360bbb-3(b)(1), unless the authorization is terminated  or revoked sooner.       Influenza A by PCR NEGATIVE NEGATIVE Final   Influenza B by PCR NEGATIVE NEGATIVE Final    Comment: (NOTE) The Xpert Xpress SARS-CoV-2/FLU/RSV plus assay is intended as an aid in the diagnosis of influenza from Nasopharyngeal swab specimens and should not be used as a sole basis for treatment. Nasal washings and aspirates are unacceptable for Xpert Xpress SARS-CoV-2/FLU/RSV testing.  Fact Sheet for Patients: EntrepreneurPulse.com.au  Fact Sheet for Healthcare Providers: IncredibleEmployment.be  This test is not yet approved or cleared by the [place] FDA and has been authorized for detection and/or diagnosis of SARS-CoV-2 by FDA under an Emergency Use Authorization (EUA). This EUA will remain in effect (meaning this test can be used) for the duration of the COVID-19 declaration under Section 564(b)(1) of the Act, 21 U.S.C. section 360bbb-3(b)(1), unless the authorization is terminated or revoked.  Performed at [facility], [address] [place], [place] [address]       Imaging Studies   MR BRAIN WO CONTRAST  Result Date: 12/29/2020 CLINICAL DATA:  CN 9 neuropathy, bilateral arm and leg weakness with severe neck pain EXAM: MRI HEAD WITHOUT CONTRAST MRI CERVICAL SPINE WITHOUT CONTRAST TECHNIQUE: Multiplanar, multiecho pulse sequences of the brain and surrounding structures, and cervical spine, to include the craniocervical junction and cervicothoracic junction, were obtained without intravenous contrast. COMPARISON:  No prior MRI the head or cervical spine. Correlation is made with CT head 03/03/2017 FINDINGS:  MRI HEAD FINDINGS Brain: No acute infarction, hemorrhage, hydrocephalus, extra-axial collection, or mass lesion. Mild central cerebral volume loss. No foci of hemosiderin deposition to suggest remote hemorrhage. Vascular: Normal flow voids. Skull and upper cervical spine: Normal marrow signal. Sinuses/Orbits: Negative. Other: The mastoids are well aerated. MRI CERVICAL SPINE FINDINGS Alignment: Straightening of the normal cervical lordosis. No significant listhesis. Vertebrae: Multilevel endplate degenerative changes. No fracture, evidence discitis, or osseous lesion. Congenitally short pedicles, which narrow the AP diameter of the spinal canal. Cord: Increased T2 signal within the entire cross-section spinal  cord at C3-C4 (series 9, image 8 and series 12, image 10-11), secondary to focal compression. Additional punctate foci of increased T2 signal in the spinal cord at C5-C6 (series 9, image 9 and series 12, image 20), left greater than right. No evidence of cord expansion at either level. Posterior Fossa, vertebral arteries, paraspinal tissues: Negative. Disc levels: C2-C3: Central disc protrusion. No spinal canal stenosis or neural foraminal narrowing. C3-C4: Large central disc protrusion which indents and deforms the spinal cord, which demonstrates increased T2 signal. Severe spinal canal stenosis. Facet arthropathy. Moderate bilateral neural foraminal narrowing. C4-C5: Disc bulge with superimposed right foraminal protrusion. Facet and uncovertebral hypertrophy. Mild spinal canal stenosis. Severe right neural foraminal narrowing. C5-C6: Broad-based disc bulge, which abuts the ventral surface of the cord. Uncovertebral and facet arthropathy. Moderate spinal canal stenosis. Moderate to severe bilateral neural foraminal narrowing C6-C7: Broad-based disc bulge. Facet and uncovertebral hypertrophy. No spinal canal stenosis. Severe right and mild left neural foraminal narrowing. C7-T1: No significant disc bulge. No  neural foraminal narrowing or spinal canal stenosis. IMPRESSION: 1. 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 In addition there is moderate bilateral neural foraminal narrowing at this level. 2. Additional more focal increased T2 signal within the left-greater-than-right spinal cord at C5-C6, which may also be myelopathy secondary to compression. There is moderate spinal canal stenosis and moderate to severe bilateral neural foraminal narrowing at this level. 3. C4-C5 mild spinal canal stenosis and severe right neural foraminal narrowing. 4. C6-C7 severe right and mild left neural foraminal narrowing. 5. No acute intracranial process. Electronically Signed   By: Merilyn Baba M.D.   On: 12/29/2020 20:46   MR CERVICAL SPINE WO CONTRAST  Result Date: 12/29/2020 CLINICAL DATA:  CN 9 neuropathy, bilateral arm and leg weakness with severe neck pain EXAM: MRI HEAD WITHOUT CONTRAST MRI CERVICAL SPINE WITHOUT CONTRAST TECHNIQUE: Multiplanar, multiecho pulse sequences of the brain and surrounding structures, and cervical spine, to include the craniocervical junction and cervicothoracic junction, were obtained without intravenous contrast. COMPARISON:  No prior MRI the head or cervical spine. Correlation is made with CT head 03/03/2017 FINDINGS: MRI HEAD FINDINGS Brain: No acute infarction, hemorrhage, hydrocephalus, extra-axial collection, or mass lesion. Mild central cerebral volume loss. No foci of hemosiderin deposition to suggest remote hemorrhage. Vascular: Normal flow voids. Skull and upper cervical spine: Normal marrow signal. Sinuses/Orbits: Negative. Other: The mastoids are well aerated. MRI CERVICAL SPINE FINDINGS Alignment: Straightening of the normal cervical lordosis. No significant listhesis. Vertebrae: Multilevel endplate degenerative changes. No fracture,  evidence discitis, or osseous lesion. Congenitally short pedicles, which narrow the AP diameter of the spinal canal. Cord: Increased T2 signal within the entire cross-section spinal cord at C3-C4 (series 9, image 8 and series 12, image 10-11), secondary to focal compression. Additional punctate foci of increased T2 signal in the spinal cord at C5-C6 (series 9, image 9 and series 12, image 20), left greater than right. No evidence of cord expansion at either level. Posterior Fossa, vertebral arteries, paraspinal tissues: Negative. Disc levels: C2-C3: Central disc protrusion. No spinal canal stenosis or neural foraminal narrowing. C3-C4: Large central disc protrusion which indents and deforms the spinal cord, which demonstrates increased T2 signal. Severe spinal canal stenosis. Facet arthropathy. Moderate bilateral neural foraminal narrowing. C4-C5: Disc bulge with superimposed right foraminal protrusion. Facet and uncovertebral hypertrophy. Mild spinal canal stenosis. Severe right neural foraminal narrowing. C5-C6:  Broad-based disc bulge, which abuts the ventral surface of the cord. Uncovertebral and facet arthropathy. Moderate spinal canal stenosis. Moderate to severe bilateral neural foraminal narrowing C6-C7: Broad-based disc bulge. Facet and uncovertebral hypertrophy. No spinal canal stenosis. Severe right and mild left neural foraminal narrowing. C7-T1: No significant disc bulge. No neural foraminal narrowing or spinal canal stenosis. IMPRESSION: 1. 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 In addition there is moderate bilateral neural foraminal narrowing at this level. 2. Additional more focal increased T2 signal within the left-greater-than-right spinal cord at C5-C6, which may also be myelopathy secondary to compression. There is moderate spinal canal stenosis and  moderate to severe bilateral neural foraminal narrowing at this level. 3. C4-C5 mild spinal canal stenosis and severe right neural foraminal narrowing. 4. C6-C7 severe right and mild left neural foraminal narrowing. 5. No acute intracranial process. Electronically Signed   By: Merilyn Baba M.D.   On: 12/29/2020 20:46   DG Chest Port 1 View  Result Date: 12/28/2020 CLINICAL DATA:  Fever EXAM: PORTABLE CHEST 1 VIEW COMPARISON:  02/21/2017, 12/26/2020 FINDINGS: Interim development of patchy airspace disease in the left thorax. Probable small left effusion. Normal cardiac size. No pneumothorax IMPRESSION: Interim development of patchy airspace opacity in the left thorax suspicious for pneumonia. Small left effusion Electronically Signed   By: Donavan Foil M.D.   On: 12/28/2020 19:41     Medications   Scheduled Meds:  amoxicillin-clavulanate  800 mg Oral Q12H   baclofen  10 mg Oral BID   chlorproMAZINE  10 mg Oral Q12H   enoxaparin (LOVENOX) injection  40 mg Subcutaneous Q24H   feeding supplement  237 mL Oral TID BM   folic acid  1 mg Oral Daily   losartan  25 mg Oral Daily   multivitamin with minerals  1 tablet Oral Daily   saccharomyces boulardii  250 mg Oral BID   vitamin B-12  1,000 mcg Oral Daily   Vitamin D (Ergocalciferol)  50,000 Units Oral Q7 days   Continuous Infusions:       LOS: 2 days    Time spent: 30 minutes with > 50% spent at bedside and in coordination of care     Ezekiel Slocumb, DO [facility]  12/30/2020, 3:05 PM      If 7PM-7AM, please contact night-coverage. How to contact the [facility] Attending or Consulting provider [place] or covering provider during after hours [place], for this patient?    Check the care team in [facility] and look for a) attending/consulting [facility] provider listed and b) the [facility] team listed Log into [email or web address] and use [place]'s universal password to access. If you do not have the password, please contact the hospital  operator. Locate the [facility] provider you are looking for under [facility] and page to a number that you can be directly reached. If you still have difficulty reaching the provider, please page the [facility] (Director on Call) for the Hospitalists listed on amion for assistance.

## 2020-12-31 LAB — VITAMIN E
Vitamin E (Alpha Tocopherol): 7.6 mg/L — ABNORMAL LOW (ref 9.0–29.0)
Vitamin E(Gamma Tocopherol): 1.3 mg/L (ref 0.5–4.9)

## 2020-12-31 LAB — BASIC METABOLIC PANEL
Anion gap: 13 (ref 5–15)
BUN: 18 mg/dL (ref 8–23)
CO2: 27 mmol/L (ref 22–32)
Calcium: 8.6 mg/dL — ABNORMAL LOW (ref 8.9–10.3)
Chloride: 95 mmol/L — ABNORMAL LOW (ref 98–111)
Creatinine, Ser: 0.98 mg/dL (ref 0.61–1.24)
GFR, Estimated: >60 mL/min (ref >60)
Glucose, Bld: 103 mg/dL — ABNORMAL HIGH (ref 70–99)
Potassium: 3.6 mmol/L (ref 3.5–5.1)
Sodium: 135 mmol/L (ref 135–145)

## 2020-12-31 LAB — SURGICAL PATHOLOGY

## 2020-12-31 LAB — VITAMIN A: Vitamin A (Retinoic Acid): 30 ug/dL (ref 22.0–69.5)

## 2020-12-31 MED ORDER — KETOROLAC TROMETHAMINE 15 MG/ML IJ SOLN
15.0000 mg | Freq: Four times a day (QID) | INTRAMUSCULAR | Status: DC | PRN
  Filled 2020-12-31: qty 1

## 2020-12-31 MED ORDER — MORPHINE SULFATE (PF) 2 MG/ML IV SOLN
2.0000 mg | INTRAVENOUS | Status: DC | PRN
  Administered 2020-12-31: 2 mg via INTRAVENOUS
  Filled 2020-12-31: qty 1

## 2020-12-31 MED ORDER — VITAMIN D (ERGOCALCIFEROL) 1.25 MG (50000 UNIT) PO CAPS: 50000.0000 [IU] | ORAL_CAPSULE | ORAL | 3 refills | Status: DC

## 2020-12-31 MED ORDER — CHLORPROMAZINE HCL 10 MG PO TABS: 10.0000 mg | ORAL_TABLET | Freq: Two times a day (BID) | ORAL | 1 refills | Status: DC

## 2020-12-31 MED ORDER — CYANOCOBALAMIN 1000 MCG PO TABS: 1000.0000 ug | ORAL_TABLET | Freq: Every day | ORAL | 1 refills | Status: DC

## 2020-12-31 MED ORDER — LOSARTAN POTASSIUM 25 MG PO TABS: 25.0000 mg | ORAL_TABLET | Freq: Every day | ORAL | 1 refills | Status: DC

## 2020-12-31 MED ORDER — AMOXICILLIN-POT CLAVULANATE 400-57 MG/5ML PO SUSR: 800.0000 mg | Freq: Two times a day (BID) | ORAL | 0 refills | Status: AC

## 2020-12-31 MED ORDER — ADULT MULTIVITAMIN W/MINERALS CH: 1.0000 | ORAL_TABLET | Freq: Every day | ORAL | Status: DC

## 2020-12-31 MED ORDER — LIDOCAINE 5 % EX PTCH
1.0000 | MEDICATED_PATCH | CUTANEOUS | Status: DC
  Filled 2020-12-31: qty 1

## 2020-12-31 MED ORDER — ENSURE ENLIVE PO LIQD: 237.0000 mL | Freq: Three times a day (TID) | ORAL | 12 refills | Status: DC

## 2020-12-31 MED ORDER — SACCHAROMYCES BOULARDII 250 MG PO CAPS: 250.0000 mg | ORAL_CAPSULE | Freq: Two times a day (BID) | ORAL | 1 refills | Status: DC

## 2020-12-31 NOTE — Progress Notes (Signed)
Physical Therapy Treatment Patient Details Name: John Booker MRN: [id number] DOB: [birth year] Today's Date: 12/31/2020   History of Present Illness John Booker is a 69 y.o. male here with generalized weakness.  The patient states that over the last several days, he has had progressive worsening, generalized weakness.  He states that he has been essentially unable to get himself up out of his couch.  He has fallen twice in the last 24 hours.    PT Comments    Pt is making good progress towards PT goals. Of note, unwitnessed fall documented with RN staff over weekend. Pt received in bathroom un attended and needs cues and supervision for rising from low surface. RW used for ambulation in room, pt wanting to return to bed to eat ice cream. Repeated technique for standing from low surface with pt reporting he feels confident. Will continue to progress as needed. Pt reports he feels ready for dc this date. Care team updated.   Recommendations for follow up therapy are one component of a multi-disciplinary discharge planning process, led by the attending physician.  Recommendations may be updated based on patient status, additional functional criteria and insurance authorization.  Follow Up Recommendations  Home health PT     Assistance Recommended at Discharge Set up Supervision/Assistance  Equipment Recommendations  Rolling walker (2 wheels)    Recommendations for Other Services       Precautions / Restrictions Precautions Precautions: Fall Restrictions Weight Bearing Restrictions: No     Mobility  Bed Mobility Overal bed mobility: Modified Independent             General bed mobility comments: uses bed controls to adjust bed in order to scoot towards HOB.    Transfers Overall transfer level: Modified independent Equipment used: Rolling walker (2 wheels) Transfers: Sit to/from Stand Sit to Stand: Modified independent (Device/Increase time)           General  transfer comment: discussed safe hand placement (1 hand on surface, 1 on RW). Pt able to demonstrate and verablize technique prior to performance. Once standing, upright posture noted    Ambulation/Gait Ambulation/Gait assistance: Min guard Gait Distance (Feet): 15 Feet Assistive device: Rolling walker (2 wheels) Gait Pattern/deviations: Step-through pattern;Shuffle     General Gait Details: received in bathroom unattended. Assisted with ambulation back to bed with RW and cga. Shuffle gait noted. Pt deferred further ambulation as he wanted to eat his ice cream   Stairs             Wheelchair Mobility    Modified Rankin (Stroke Patients Only)       Balance Overall balance assessment: Needs assistance Sitting-balance support: No upper extremity supported;Feet supported Sitting balance-Leahy Scale: Normal     Standing balance support: Bilateral upper extremity supported Standing balance-Leahy Scale: Fair                              Cognition Arousal/Alertness: Awake/alert Behavior During Therapy: WFL for tasks assessed/performed Overall Cognitive Status: Within Functional Limits for tasks assessed                                          Exercises Other Exercises Other Exercises: received in bathroom. Needs assist from grab bar to stand from low surfaces    General Comments  Pertinent Vitals/Pain Pain Assessment: No/denies pain    Home Living                          Prior Function            PT Goals (current goals can now be found in the care plan section) Acute Rehab PT Goals Patient Stated Goal: to go home PT Goal Formulation: With patient Time For Goal Achievement: 01/09/21 Potential to Achieve Goals: Good Progress towards PT goals: Progressing toward goals    Frequency    Min 2X/week      PT Plan Current plan remains appropriate    Co-evaluation              AM-PAC PT "6 Clicks"  Mobility   Outcome Measure  Help needed turning from your back to your side while in a flat bed without using bedrails?: None Help needed moving from lying on your back to sitting on the side of a flat bed without using bedrails?: None Help needed moving to and from a bed to a chair (including a wheelchair)?: A Little Help needed standing up from a chair using your arms (e.g., wheelchair or bedside chair)?: A Little Help needed to walk in hospital room?: A Little Help needed climbing 3-5 steps with a railing? : A Lot 6 Click Score: 19    End of Session   Activity Tolerance: Patient tolerated treatment well Patient left: in bed;with bed alarm set Nurse Communication: Mobility status PT Visit Diagnosis: Unsteadiness on feet (R26.81);Repeated falls (R29.6);Muscle weakness (generalized) (M62.81);History of falling (Z91.81);Other abnormalities of gait and mobility (R26.89);Difficulty in walking, not elsewhere classified (R26.2)     Time: 6578-4696 PT Time Calculation (min) (ACUTE ONLY): 10 min  Charges:  $Gait Training: 8-22 mins                     John Booker, PT, DPT [phone]    John Booker 12/31/2020, 10:25 AM

## 2020-12-31 NOTE — Progress Notes (Signed)
Patient and daughter given AVS and PIV removed.  Patient discharged to home with daughter, walker and all belongings taken with patient.  Patient leaves the unit via w/c.

## 2020-12-31 NOTE — TOC Progression Note (Signed)
Transition of Care [facility]) - Progression Note    Patient Details  Name: John Booker MRN: [id number] Date of Birth: August 18, 1950  Transition of Care [facility]) CM/SW [place], RN Phone Number: 12/31/2020, 12:04 PM  Clinical Narrative:  Patient will be discharging home today with Home Health, [place] will take patient for care.  Patient has walker by bedside.  Good RX card provided.  Patient did not want to speak with Case Manager for anything further.  TOC contact information provided to patient.     Expected Discharge Plan: [place] Barriers to Discharge: Continued Medical Work up  Expected Discharge Plan and Services Expected Discharge Plan: [place]   Discharge Planning Services: CM Consult   Living arrangements for the past 2 months: Single Family Home Expected Discharge Date: 12/31/20               DME Arranged:  (TBD)         HH Arranged:  (TBD)           Social Determinants of Health (SDOH) Interventions    Readmission Risk Interventions No flowsheet data found.

## 2021-01-01 ENCOUNTER — Telehealth: Payer: Self-pay

## 2021-01-01 NOTE — Telephone Encounter (Signed)
Transition Care Management Follow-up Telephone Call Date of discharge and from where: 12/31/2020 [facility] How have you been since you were released from the hospital? ok Any questions or concerns? No  Items Reviewed: Did the pt receive and understand the discharge instructions provided? Yes  Medications obtained and verified? Yes  Other? No  Any new allergies since your discharge? No  Dietary orders reviewed? Yes Do you have support at home? Yes   Home Care and Equipment/Supplies: Were home health services ordered? not applicable If so, what is the name of the agency? N/a  Has the agency set up a time to come to the patient's home? not applicable Were any new equipment or medical supplies ordered?  No What is the name of the medical supply agency? N/a Were you able to get the supplies/equipment? not applicable Do you have any questions related to the use of the equipment or supplies? No  Functional Questionnaire: (I = Independent and D = Dependent) ADLs: I  Bathing/Dressing- I  Meal Prep- I  Eating- I  Maintaining continence- I  Transferring/Ambulation- I  Managing Meds- I  Follow up appointments reviewed:  PCP Hospital f/u appt confirmed? Yes  Scheduled to see Dr. Parks Ranger on 01/08/2021 @ 2:20. Are transportation arrangements needed? No  If their condition worsens, is the pt aware to call PCP or go to the Emergency Dept.? Yes Was the patient provided with contact information for the PCP's office or ED? Yes Was to pt encouraged to call back with questions or concerns? Yes

## 2021-01-02 LAB — VITAMIN K1, SERUM: VITAMIN K1: <0.1 ng/mL — ABNORMAL LOW (ref 0.10–2.20)

## 2021-01-06 LAB — MISC LABCORP TEST (SEND OUT): Labcorp test code: 70115

## 2021-01-08 ENCOUNTER — Inpatient Hospital Stay: Payer: Medicare Other | Admitting: Family Medicine

## 2021-01-22 DIAGNOSIS — L02212 Cutaneous abscess of back [any part, except buttock]: Secondary | ICD-10-CM | POA: Diagnosis not present

## 2021-01-22 DIAGNOSIS — L720 Epidermal cyst: Secondary | ICD-10-CM | POA: Diagnosis not present

## 2021-01-22 DIAGNOSIS — L02214 Cutaneous abscess of groin: Secondary | ICD-10-CM | POA: Diagnosis not present

## 2021-02-05 ENCOUNTER — Ambulatory Visit: Payer: Self-pay | Admitting: Surgery

## 2021-02-05 ENCOUNTER — Ambulatory Visit: Payer: Self-pay | Admitting: *Deleted

## 2021-02-05 DIAGNOSIS — D171 Benign lipomatous neoplasm of skin and subcutaneous tissue of trunk: Secondary | ICD-10-CM | POA: Diagnosis not present

## 2021-02-05 DIAGNOSIS — L02212 Cutaneous abscess of back [any part, except buttock]: Secondary | ICD-10-CM | POA: Diagnosis not present

## 2021-02-05 DIAGNOSIS — L02214 Cutaneous abscess of groin: Secondary | ICD-10-CM | POA: Diagnosis not present

## 2021-02-05 DIAGNOSIS — L720 Epidermal cyst: Secondary | ICD-10-CM | POA: Diagnosis not present

## 2021-02-05 NOTE — H&P (Signed)
Subjective:  CC: Groin abscess [L02.214] and back abscess POSTOP  HPI:  John Booker is a 70 y.o. male who is here for followup from above.  Some drainage from back abscess site, feels lump in groin I&D site.  Also requesting removal of cyst and lump on back, as well as skin tag in right groin     Current Medications: has a current medication list which includes the following prescription(s): baclofen, chlorpromazine, cyanocobalamin, losartan, multivitamin with minerals, saccharomyces boulardii, and vitamin d2.  Allergies:  Allergies Allergen Reactions  Nifedipine Swelling   Foot swelling.   Amlodipine Other (See Comments)   Leg swelling   ROS: General: Denies weight loss, weight gain, fatigue, fevers, chills, and night sweats. Heart: Denies chest pain, palpitations, racing heart, irregular heartbeat, leg pain or swelling, and decreased activity tolerance. Respiratory: Denies breathing difficulty, shortness of breath, wheezing, cough, and sputum. GI: Denies change in appetite, heartburn, nausea, vomiting, constipation, diarrhea, and blood in stool. GU: Denies difficulty urinating, pain with urinating, urgency, frequency, blood in urine    Objective:    BP (!) 145/83   Pulse 96   Ht 190.5 cm (6\' 3" )   Wt 94.3 kg (208 lb)   BMI 26.00 kg/m   Constitutional :  Alert, no distress, cooperative Gastrointestinal: soft, non-tender; bowel sounds normal; no masses,  no organomegaly.  Musculoskeletal: Steady gait and movement Skin: Cool and moist, incisions clean, dry, .  No erythema, induration or purulent drainage to indicate infection. However, area of caseous drainage noted adjacent to back I&D site.  Has one benign cyst on right posterior shoulder and deeper cyst medial to it. Deeper cyst measures 3.2cm x 3cm.  Also has likely lipoma on right scapula measuring 4.5cm x 5cm, somewhat deep and immobile to palpation.    Right groin area with skin tag, medium size Psychiatric: Normal  affect, non-agitated, not confused     LABS:  N/A   RADS: N/A  Assessment:     Groin abscess [L02.214] and back abscess S/p I&D in office  Plan:    1. Groin Healing well.  No issues.  Healing ridge will flatten and resolve on its own.  Continue activity restrictions as previously discussed.  2.  Pt requesting removal of shoulder cyst in office today.  Will proceed.  3.  Back abscess site with recurrent caseous discharge from adjacent pore.  Will need formal wider excision of area.  Will schedule in OR at same time as removal of another epidermal cyst on back, skin tag in right groin, and lipoma on back as well.   Alternatives include continued observation.  Benefits include possible symptom relief, pathologic evaluation, improved cosmesis. Discussed the risk of surgery including recurrence, chronic pain, post-op infxn, poor cosmesis, poor/delayed wound healing, and possible re-operation to address said risks. The risks of general anesthetic, if used, includes MI, CVA, sudden death or even reaction to anesthetic medications also discussed.  Typical post-op recovery time of 3-5 days with possible activity restrictions were also discussed.  The patient verbalized understanding and all questions were answered to the patient's satisfaction.  Prone, then supine positions

## 2021-02-05 NOTE — Telephone Encounter (Signed)
Reason for Disposition  [1] MODERATE dizziness (e.g., interferes with normal activities) AND [2] has been evaluated by physician for this  Answer Assessment - Initial Assessment Questions 1. DESCRIPTION: "Describe your dizziness."     lightheaded 2. LIGHTHEADED: "Do you feel lightheaded?" (e.g., somewhat faint, woozy, weak upon standing)     "Swimmy" head , upon standing or sitting postion 3. VERTIGO: "Do you feel like either you or the room is spinning or tilting?" (i.e. vertigo)     na 4. SEVERITY: "How bad is it?"  "Do you feel like you are going to faint?" "Can you stand and walk?"   - MILD: Feels slightly dizzy, but walking normally.   - MODERATE: Feels unsteady when walking, but not falling; interferes with normal activities (e.g., school, work).   - SEVERE: Unable to walk without falling, or requires assistance to walk without falling; feels like passing out now.      Mild moderate  5. ONSET:  "When did the dizziness begin?"     Middle of last week  6. AGGRAVATING FACTORS: "Does anything make it worse?" (e.g., standing, change in head position)     Sitting or standing position  7. HEART RATE: "Can you tell me your heart rate?" "How many beats in 15 seconds?"  (Note: not all patients can do this)       No  8. CAUSE: "What do you think is causing the dizziness?"     Started after starting new medication dose of losartan 9. RECURRENT SYMPTOM: "Have you had dizziness before?" If Yes, ask: "When was the last time?" "What happened that time?"     Na  10. OTHER SYMPTOMS: "Do you have any other symptoms?" (e.g., fever, chest pain, vomiting, diarrhea, bleeding)       No  11. PREGNANCY: "Is there any chance you are pregnant?" "When was your last menstrual period?"       na  Protocols used: Dizziness - Lightheadedness-A-AH

## 2021-02-05 NOTE — Telephone Encounter (Signed)
Patient's daughter called to report patient is having dizzy spells and is prone to falling. Reports patient was in hospital  In Oct. And had an occasion of orthostatic hypotension. C/o dizziness happening more often since lowering of losartan after leaving hospital. C/o blurry vision, lightheadedness, 'swimmy head". Denies chest pain , difficulty breathing no headaches , weakness on either side of body.  BP this am 145/84.  Earliest appt 02/12/21. Care advise given. Patient 's daughter verbalized understanding of care advise and to call back or go to [facility] or ED if symptoms worsen.

## 2021-02-06 NOTE — Telephone Encounter (Signed)
PCP will discuss at upcoming visit. Recommend UC/ER if has worsening dizzy spells/falls prior to that time

## 2021-02-07 DIAGNOSIS — L97511 Non-pressure chronic ulcer of other part of right foot limited to breakdown of skin: Secondary | ICD-10-CM | POA: Diagnosis not present

## 2021-02-07 DIAGNOSIS — M216X1 Other acquired deformities of right foot: Secondary | ICD-10-CM | POA: Diagnosis not present

## 2021-02-07 DIAGNOSIS — M778 Other enthesopathies, not elsewhere classified: Secondary | ICD-10-CM | POA: Diagnosis not present

## 2021-02-12 ENCOUNTER — Encounter: Payer: Self-pay | Admitting: Family Medicine

## 2021-02-12 ENCOUNTER — Ambulatory Visit (INDEPENDENT_AMBULATORY_CARE_PROVIDER_SITE_OTHER): Payer: Medicare Other | Admitting: Family Medicine

## 2021-02-12 ENCOUNTER — Other Ambulatory Visit: Payer: Self-pay

## 2021-02-12 VITALS — BP 152/71 | HR 86 | Ht 75.0 in | Wt 218.6 lb

## 2021-02-12 DIAGNOSIS — R066 Hiccough: Secondary | ICD-10-CM

## 2021-02-12 DIAGNOSIS — I10 Essential (primary) hypertension: Secondary | ICD-10-CM

## 2021-02-12 MED ORDER — LOSARTAN POTASSIUM 50 MG PO TABS: 50.0000 mg | ORAL_TABLET | Freq: Every day | ORAL | 2 refills | Status: DC

## 2021-02-12 NOTE — Patient Instructions (Addendum)
Thank you for coming to the office today.  Step 1)  Start with increasing Losartan from 25mg  up to 50mg  - NEW rx Losartan 50mg  sent to pharmacy now.  Keep on the Thorazine 10mg  twice a day  Try these plan for 1-2 weeks.  If BP improves (goes down) and dizziness stays the same, then likely the Losartan is not the cause of dizziness.  If BP improves and dizziness gets worse then perhaps losartan is the problem and we can switch off and on to a new med.   Step 2)  If  BP improved on Losartan and we want to change the Thorazine dose  We can switch to Thorazine 10mg  ONCE daily in the morning.  Try this to see if dizziness improves. If hiccups get worse, but dizziness does NOT improve.  Then back to the drawing board and we can figure out other causes of dizziness.   Back up plan - consider Gabapentin trial again, or consider Neurologist or Neurosurgery options.   Please schedule a Follow-up Appointment to: Return in about 4 weeks (around 03/12/2021), or if symptoms worsen or fail to improve.  If you have any other questions or concerns, please feel free to call the office or send a message through [place]. You may also schedule an earlier appointment if necessary.  Additionally, you may be receiving a survey about your experience at our office within a few days to 1 week by e-mail or mail. We value your feedback.  [name], DO [place]

## 2021-02-12 NOTE — Progress Notes (Signed)
Subjective:    Patient ID: John Booker, male    DOB: 1950/07/23, 70 y.o.   MRN: [id number]  John Booker is a 70 y.o. male presenting on 02/12/2021 for Hypertension   HPI  Essential Hypertension Orthostatic Hypotension Hiccups  Last visit with me 12/20/20 Hospital follow-up Admitted for dehydration on 12/26/20 and discharged 12/31/20 He was considered a fall risk and having issues with orthostatic hypotension, with standing up would have bottoming out of BP in ED He was taken off Lisinopril, and switched him to Losartan Discontinued off HCTZ at that time.  Hiccups, retractable Hospitalization they adjusted med, and neurology consult. On Baclofen and Chlorpromazine (Thorazine) for hiccups with improvement, but not resolved. Now able to eat more and get wt back up - Consider follow-up with Neurology on hiccups. Also has 2 bulging discs in neck and he will see Neurosurgery eval to figure out if there is C-spine disc impingement causing diaphragm problem.  He was advised no major side effects on Thorazine, and now we are thinking it may be causing dizziness.   Depression screen [facility] 2/9 02/12/2021 12/20/2020 12/20/2019  Decreased Interest 3 0 0  Down, Depressed, Hopeless 1 0 0  PHQ - 2 Score 4 0 0  Altered sleeping 1 - -  Tired, decreased energy 2 - -  Change in appetite 0 - -  Feeling bad or failure about yourself  0 - -  Trouble concentrating 0 - -  Moving slowly or fidgety/restless 0 - -  Suicidal thoughts 0 - -  PHQ-9 Score 7 - -  Difficult doing work/chores Somewhat difficult - -    Social History   Tobacco Use   Smoking status: Former    Packs/day: 0.50    Years: 38.00    Pack years: 19.00    Types: Cigarettes   Smokeless tobacco: Former  Scientific laboratory technician Use: Never used  Substance Use Topics   Alcohol use: Not Currently    Comment: several months,[place]   Drug use: No    Review of Systems Per HPI unless specifically indicated above      Objective:    BP (!) 152/71   Pulse 86   Ht 6\' 3"  (1.905 m)   Wt 218 lb 9.6 oz (99.2 kg)   SpO2 100%   BMI 27.32 kg/m   Wt Readings from Last 3 Encounters:  02/12/21 218 lb 9.6 oz (99.2 kg)  12/30/20 202 lb 13.2 oz (92 kg)  12/20/20 199 lb (90.3 kg)    Physical Exam Vitals and nursing note reviewed.  Constitutional:      General: He is not in acute distress.    Appearance: Normal appearance. He is well-developed. He is not diaphoretic.     Comments: Well-appearing, comfortable, cooperative  HENT:     Head: Normocephalic and atraumatic.  Eyes:     General:        Right eye: No discharge.        Left eye: No discharge.     Conjunctiva/sclera: Conjunctivae normal.  Cardiovascular:     Rate and Rhythm: Normal rate.  Pulmonary:     Effort: Pulmonary effort is normal.  Skin:    General: Skin is warm and dry.     Findings: No erythema or rash.  Neurological:     Mental Status: He is alert and oriented to person, place, and time.  Psychiatric:        Mood and Affect: Mood normal.  Behavior: Behavior normal.        Thought Content: Thought content normal.     Comments: Well groomed, good eye contact, normal speech and thoughts     Results for orders placed or performed during the hospital encounter of 12/26/20  Resp Panel by RT-PCR (Flu A&B, Covid) Nasopharyngeal Swab   Specimen: Nasopharyngeal Swab; Nasopharyngeal(NP) swabs in vial transport medium  Result Value Ref Range   SARS Coronavirus 2 by RT PCR NEGATIVE NEGATIVE   Influenza A by PCR NEGATIVE NEGATIVE   Influenza B by PCR NEGATIVE NEGATIVE  Basic metabolic panel  Result Value Ref Range   Sodium 129 (L) 135 - 145 mmol/L   Potassium 4.1 3.5 - 5.1 mmol/L   Chloride 91 (L) 98 - 111 mmol/L   CO2 26 22 - 32 mmol/L   Glucose, Bld 103 (H) 70 - 99 mg/dL   BUN 22 8 - 23 mg/dL   Creatinine, Ser 1.45 (H) 0.61 - 1.24 mg/dL   Calcium 8.9 8.9 - 10.3 mg/dL   GFR, Estimated 52 (L) >60 mL/min   Anion gap 12 5 - 15   CBC  Result Value Ref Range   WBC 7.5 4.0 - 10.5 K/uL   RBC 4.75 4.22 - 5.81 MIL/uL   Hemoglobin 15.9 13.0 - 17.0 g/dL   HCT 46.9 39.0 - 52.0 %   MCV 98.7 80.0 - 100.0 fL   MCH 33.5 26.0 - 34.0 pg   MCHC 33.9 30.0 - 36.0 g/dL   RDW 15.4 11.5 - 15.5 %   Platelets 157 150 - 400 K/uL   nRBC 0.0 0.0 - 0.2 %  Urinalysis, Complete w Microscopic Urine, Clean Catch  Result Value Ref Range   Color, Urine AMBER (A) YELLOW   APPearance HAZY (A) CLEAR   Specific Gravity, Urine 1.019 1.005 - 1.030   pH 5.0 5.0 - 8.0   Glucose, UA NEGATIVE NEGATIVE mg/dL   Hgb urine dipstick MODERATE (A) NEGATIVE   Bilirubin Urine NEGATIVE NEGATIVE   Ketones, ur 5 (A) NEGATIVE mg/dL   Protein, ur 30 (A) NEGATIVE mg/dL   Nitrite NEGATIVE NEGATIVE   Leukocytes,Ua NEGATIVE NEGATIVE   RBC / HPF 0-5 0 - 5 RBC/hpf   WBC, UA 0-5 0 - 5 WBC/hpf   Bacteria, UA RARE (A) NONE SEEN   Squamous Epithelial / LPF 0-5 0 - 5   Mucus PRESENT    Hyaline Casts, UA PRESENT   Lipase, blood  Result Value Ref Range   Lipase 26 11 - 51 U/L  Hepatic function panel  Result Value Ref Range   Total Protein 7.0 6.5 - 8.1 g/dL   Albumin 3.7 3.5 - 5.0 g/dL   AST 98 (H) 15 - 41 U/L   ALT 27 0 - 44 U/L   Alkaline Phosphatase 78 38 - 126 U/L   Total Bilirubin 1.8 (H) 0.3 - 1.2 mg/dL   Bilirubin, Direct 0.3 (H) 0.0 - 0.2 mg/dL   Indirect Bilirubin 1.5 (H) 0.3 - 0.9 mg/dL  Osmolality  Result Value Ref Range   Osmolality 277 275 - 295 mOsm/kg  Brain natriuretic peptide  Result Value Ref Range   B Natriuretic Peptide 218.2 (H) 0.0 - 100.0 pg/mL  Vitamin B12  Result Value Ref Range   Vitamin B-12 350 180 - 914 pg/mL  Phosphorus  Result Value Ref Range   Phosphorus 2.5 2.5 - 4.6 mg/dL  TSH  Result Value Ref Range   TSH 3.100 0.350 - 4.500 uIU/mL  Vitamin  B1  Result Value Ref Range   Vitamin B1 (Thiamine) 95.0 66.5 - 200.0 nmol/L  Ethanol  Result Value Ref Range   Alcohol, Ethyl (B) <10 <10 mg/dL  Miscellaneous [facility] test  (send-out)  Result Value Ref Range   [facility] test code [id number]    [facility] test name B3    Misc [facility] result COMMENT   Vitamin A  Result Value Ref Range   Vitamin A (Retinoic Acid) 30.0 22.0 - 69.5 ug/dL  Vitamin E  Result Value Ref Range   Vitamin E (Alpha Tocopherol) 7.6 (L) 9.0 - 29.0 mg/L   Vitamin E(Gamma Tocopherol) 1.3 0.5 - 4.9 mg/L  Vitamin K1, Serum  Result Value Ref Range   VITAMIN K1 <0.10 (L) 0.10 - 2.20 ng/mL  VITAMIN D 25 Hydroxy (Vit-D Deficiency, Fractures)  Result Value Ref Range   Vit D, 25-Hydroxy 14.56 (L) 30 - 100 ng/mL  Zinc  Result Value Ref Range   Zinc 40 (L) 44 - 115 ug/dL  Copper, serum  Result Value Ref Range   Copper 117 69 - 132 ug/dL  Magnesium  Result Value Ref Range   Magnesium 1.5 (L) 1.7 - 2.4 mg/dL  Basic metabolic panel  Result Value Ref Range   Sodium 132 (L) 135 - 145 mmol/L   Potassium 3.3 (L) 3.5 - 5.1 mmol/L   Chloride 97 (L) 98 - 111 mmol/L   CO2 27 22 - 32 mmol/L   Glucose, Bld 85 70 - 99 mg/dL   BUN 23 8 - 23 mg/dL   Creatinine, Ser 1.52 (H) 0.61 - 1.24 mg/dL   Calcium 8.4 (L) 8.9 - 10.3 mg/dL   GFR, Estimated 49 (L) >60 mL/min   Anion gap 8 5 - 15  CBC  Result Value Ref Range   WBC 4.0 4.0 - 10.5 K/uL   RBC 3.99 (L) 4.22 - 5.81 MIL/uL   Hemoglobin 13.9 13.0 - 17.0 g/dL   HCT 40.2 39.0 - 52.0 %   MCV 100.8 (H) 80.0 - 100.0 fL   MCH 34.8 (H) 26.0 - 34.0 pg   MCHC 34.6 30.0 - 36.0 g/dL   RDW 15.3 11.5 - 15.5 %   Platelets 129 (L) 150 - 400 K/uL   nRBC 0.0 0.0 - 0.2 %  Vitamin B6  Result Value Ref Range   Vitamin B6 4.8 3.4 - 65.2 ug/L  Magnesium  Result Value Ref Range   Magnesium 1.7 1.7 - 2.4 mg/dL  Basic metabolic panel  Result Value Ref Range   Sodium 132 (L) 135 - 145 mmol/L   Potassium 3.4 (L) 3.5 - 5.1 mmol/L   Chloride 100 98 - 111 mmol/L   CO2 26 22 - 32 mmol/L   Glucose, Bld 93 70 - 99 mg/dL   BUN 24 (H) 8 - 23 mg/dL   Creatinine, Ser 1.13 0.61 - 1.24 mg/dL   Calcium 8.1 (L) 8.9 - 10.3 mg/dL    GFR, Estimated >60 >60 mL/min   Anion gap 6 5 - 15  CBC  Result Value Ref Range   WBC 3.7 (L) 4.0 - 10.5 K/uL   RBC 3.71 (L) 4.22 - 5.81 MIL/uL   Hemoglobin 12.6 (L) 13.0 - 17.0 g/dL   HCT 36.7 (L) 39.0 - 52.0 %   MCV 98.9 80.0 - 100.0 fL   MCH 34.0 26.0 - 34.0 pg   MCHC 34.3 30.0 - 36.0 g/dL   RDW 15.3 11.5 - 15.5 %   Platelets 120 (L) 150 - 400  K/uL   nRBC 0.0 0.0 - 0.2 %  Magnesium  Result Value Ref Range   Magnesium 1.7 1.7 - 2.4 mg/dL  CBC  Result Value Ref Range   WBC 8.1 4.0 - 10.5 K/uL   RBC 3.98 (L) 4.22 - 5.81 MIL/uL   Hemoglobin 13.4 13.0 - 17.0 g/dL   HCT 39.5 39.0 - 52.0 %   MCV 99.2 80.0 - 100.0 fL   MCH 33.7 26.0 - 34.0 pg   MCHC 33.9 30.0 - 36.0 g/dL   RDW 14.9 11.5 - 15.5 %   Platelets 132 (L) 150 - 400 K/uL   nRBC 0.0 0.0 - 0.2 %  Comprehensive metabolic panel  Result Value Ref Range   Sodium 134 (L) 135 - 145 mmol/L   Potassium 4.0 3.5 - 5.1 mmol/L   Chloride 100 98 - 111 mmol/L   CO2 29 22 - 32 mmol/L   Glucose, Bld 90 70 - 99 mg/dL   BUN 16 8 - 23 mg/dL   Creatinine, Ser 1.11 0.61 - 1.24 mg/dL   Calcium 8.1 (L) 8.9 - 10.3 mg/dL   Total Protein 5.4 (L) 6.5 - 8.1 g/dL   Albumin 2.8 (L) 3.5 - 5.0 g/dL   AST 38 15 - 41 U/L   ALT 25 0 - 44 U/L   Alkaline Phosphatase 45 38 - 126 U/L   Total Bilirubin 1.2 0.3 - 1.2 mg/dL   GFR, Estimated >60 >60 mL/min   Anion gap 5 5 - 15  Basic metabolic panel  Result Value Ref Range   Sodium 134 (L) 135 - 145 mmol/L   Potassium 3.8 3.5 - 5.1 mmol/L   Chloride 100 98 - 111 mmol/L   CO2 32 22 - 32 mmol/L   Glucose, Bld 106 (H) 70 - 99 mg/dL   BUN 15 8 - 23 mg/dL   Creatinine, Ser 0.94 0.61 - 1.24 mg/dL   Calcium 8.5 (L) 8.9 - 10.3 mg/dL   GFR, Estimated >60 >60 mL/min   Anion gap 2 (L) 5 - 15  CBC  Result Value Ref Range   WBC 6.7 4.0 - 10.5 K/uL   RBC 3.74 (L) 4.22 - 5.81 MIL/uL   Hemoglobin 12.8 (L) 13.0 - 17.0 g/dL   HCT 38.1 (L) 39.0 - 52.0 %   MCV 101.9 (H) 80.0 - 100.0 fL   MCH 34.2 (H) 26.0 -  34.0 pg   MCHC 33.6 30.0 - 36.0 g/dL   RDW 15.0 11.5 - 15.5 %   Platelets 147 (L) 150 - 400 K/uL   nRBC 0.0 0.0 - 0.2 %  Magnesium  Result Value Ref Range   Magnesium 1.8 1.7 - 2.4 mg/dL  Basic metabolic panel  Result Value Ref Range   Sodium 135 135 - 145 mmol/L   Potassium 3.6 3.5 - 5.1 mmol/L   Chloride 95 (L) 98 - 111 mmol/L   CO2 27 22 - 32 mmol/L   Glucose, Bld 103 (H) 70 - 99 mg/dL   BUN 18 8 - 23 mg/dL   Creatinine, Ser 0.98 0.61 - 1.24 mg/dL   Calcium 8.6 (L) 8.9 - 10.3 mg/dL   GFR, Estimated >60 >60 mL/min   Anion gap 13 5 - 15  Surgical pathology  Result Value Ref Range   SURGICAL PATHOLOGY      SURGICAL PATHOLOGY CASE: [id number] PATIENT: [facility] Surgical Pathology Report     Specimen Submitted: A. Duodenum; cbx B. Stomach; cbx  Clinical History: Intractable hiccups, weight loss,  abnormal barium swallow. Findings: Gastritis/duodenitis (mild)     DIAGNOSIS: A.  DUODENUM; COLD BIOPSY: - NONSPECIFIC CHRONIC DUODENITIS WITH MILD INACTIVE INFLAMMATION, GASTRIC METAPLASIA, AND BRUNNER'S GLAND HYPERPLASIA. - VILLOUS ARCHITECTURE IS GENERALLY PRESERVED. - NEGATIVE FOR INTRAEPITHELIAL LYMPHOCYTOSIS, DYSPLASIA, AND MALIGNANCY.  B.  STOMACH; COLD BIOPSY: - MILD REACTIVE GASTROPATHY AND CONGESTION. - NEGATIVE FOR ACTIVE INFLAMMATION, H. PYLORI, INTESTINAL METAPLASIA, DYSPLASIA, AND MALIGNANCY.  GROSS DESCRIPTION: A. Labeled: CBX duodenum for duodenitis Received: In formalin Collection time: 11:18 AM on 12/28/2020 Placed into formalin time: 11:19 AM on 12/28/2020 Tissue fragment(s): 2 Size: Up to 0.5 and 0.4 cm Description: Received are 2 fragm ents of pink-tan soft tissue. Entirely submitted in cassette 1.  B. Labeled: CBX gastric for gastritis Received: In formalin Collection time: 11:25 AM on 12/28/2020 Placed into formalin time: 11:25 AM on 12/28/2020 Tissue fragment(s): 1 Size: 0.5 x 0.4 x 0.1 cm Description: Received is a single  fragment of pink-tan soft tissue. Entirely submitted in cassette 1.  BAS 12/28/2020   Final Diagnosis performed by Bryan Lemma, MD.   Electronically signed 12/31/2020 7:51:41PM The electronic signature indicates that the named Attending Pathologist has evaluated the specimen Technical component performed at [facility], [address] [place], [place] [address] Lab: [phone] Dir: Rush Farmer, MD, MMM  Professional component performed at [facility], [facility], [place], [place], [place] [address] Lab: [phone] Dir: Kathi Simpers, MD   Troponin I (High Sensitivity)  Result Value Ref Range   Troponin I (High Sensitivity) 31 (H) <18 ng/L  Troponin I (High Sensitivity)  Result Value Ref Range   Troponin I (High Sensitivity) 22 (H) <18 ng/L  Troponin I (High Sensitivity)  Result Value Ref Range   Troponin I (High Sensitivity) 24 (H) <18 ng/L      Assessment & Plan:   Problem List Items Addressed This Visit     Intractable hiccups   Essential hypertension - Primary   Relevant Medications   losartan (COZAAR) 50 MG tablet    I am not convinced Losartan is causing dizziness, at low dose 25mg , when he was on much higher HTN medications prior to hospitalization.  He had poor PO caused dehydration and orthostasis, this was thought due to hiccups impacting his intake. With weight loss.  Now improved s/p thorazine therapy, he had consult w/ neurology they are consider options for managing the hiccups in future, other med and also neurosurgery consult for nerve in spine being impacted that controls diaphragm  Now will adjust medications.  Elevated BP HTN  Step 1)  Start with increasing Losartan from 25mg  up to 50mg  - NEW rx Losartan 50mg  sent to pharmacy now.  Keep on the Thorazine 10mg  twice a day  Try these plan for 1-2 weeks.  If BP improves (goes down) and dizziness stays the same, then likely the Losartan is not the cause of dizziness.  If BP  improves and dizziness gets worse then perhaps losartan is the problem and we can switch off and on to a new med.   Step 2)  If  BP improved on Losartan and we want to change the Thorazine dose  We can switch to Thorazine 10mg  ONCE daily in the morning.  Try this to see if dizziness improves. If hiccups get worse, but dizziness does NOT improve.  Then back to the drawing board and we can figure out other causes of dizziness.   Back up plan - consider Gabapentin trial again, or consider Neurologist or Neurosurgery options.  Meds ordered this encounter  Medications   losartan (COZAAR) 50 MG tablet    Sig: Take 1 tablet (50 mg total) by mouth daily.    Dispense:  30 tablet    Refill:  2    Dose increase from 25 to 50mg . Please discontinue previous rx.      Follow up plan: Return in about 4 weeks (around 03/12/2021), or if symptoms worsen or fail to improve.  John Booker, [facility] 02/12/2021, 2:39 PM

## 2021-02-14 ENCOUNTER — Telehealth: Payer: Self-pay | Admitting: Family Medicine

## 2021-02-14 DIAGNOSIS — R066 Hiccough: Secondary | ICD-10-CM

## 2021-02-14 NOTE — Telephone Encounter (Signed)
Medication Refill - Medication: chlorproMAZINE (THORAZINE) 10 MG tablet  Has the patient contacted their pharmacy? Yes.     (Agent: If yes, when and what did the pharmacy advise?) No additional refills   Preferred Pharmacy (with phone number or street name):   [place], [place] - [place]  [place] [address]  Phone: [phone] Fax: [phone]   Has the patient been seen for an appointment in the last year OR does the patient have an upcoming appointment? Yes.    Agent: Please be advised that RX refills may take up to 3 business days. We ask that you follow-up with your pharmacy.

## 2021-02-14 NOTE — Telephone Encounter (Signed)
Requested medication (s) are due for refill today:   Provider to review  Requested medication (s) are on the active medication list:   Yes  Future visit scheduled:   Yes   Last ordered: 12/31/2020 #60, 1 refill  Returned because it's a non delegated refill.    Requested Prescriptions  Pending Prescriptions Disp Refills   chlorproMAZINE (THORAZINE) 10 MG tablet 60 tablet 1    Sig: Take 1 tablet (10 mg total) by mouth every 12 (twelve) hours.     Not Delegated - Psychiatry:  Antipsychotics - First Generation (Typical) Failed - 02/14/2021  2:47 PM      Failed - This refill cannot be delegated      Passed - Cr in normal range and within 360 days    Creat  Date Value Ref Range Status  12/20/2020 1.15 0.70 - 1.28 mg/dL Final   Creatinine, Ser  Date Value Ref Range Status  12/31/2020 0.98 0.61 - 1.24 mg/dL Final          Passed - AST in normal range and within 360 days    AST  Date Value Ref Range Status  12/29/2020 38 15 - 41 U/L Final   SGOT(AST)  Date Value Ref Range Status  04/15/2012 72 (H) 15 - 37 Unit/L Final          Passed - ALT in normal range and within 360 days    ALT  Date Value Ref Range Status  12/29/2020 25 0 - 44 U/L Final   SGPT (ALT)  Date Value Ref Range Status  04/15/2012 63 12 - 78 U/L Final          Passed - Valid encounter within last 6 months    Recent Outpatient Visits           2 days ago Essential hypertension   [place], Devonne Doughty, DO   1 month ago Annual physical exam   [facility] Olin Hauser, DO   7 months ago Essential hypertension   [facility] Olin Hauser, DO   11 months ago Essential hypertension   [place], Devonne Doughty, DO   1 year ago Annual physical exam   [place], DO       Future Appointments             In 4 months [place], Devonne Doughty, DO  [facility], [facility]

## 2021-02-15 ENCOUNTER — Other Ambulatory Visit: Payer: Self-pay

## 2021-02-15 MED ORDER — CHLORPROMAZINE HCL 10 MG PO TABS: 10.0000 mg | ORAL_TABLET | Freq: Two times a day (BID) | ORAL | 0 refills | Status: DC

## 2021-02-15 NOTE — Addendum Note (Signed)
Addended by: Olin Hauser on: 02/15/2021 05:18 PM   Modules accepted: Orders

## 2021-02-15 NOTE — Telephone Encounter (Signed)
Ordered med as requested to [facility].  Keep in mind our last visit we advised to adjust his medications. They will need to share an update before future refills I know which med is working and what dose he is taking  [name], [facility] 02/15/2021, 5:18 PM

## 2021-02-15 NOTE — Telephone Encounter (Signed)
Pts Daughter John Booker is calling to check on the status of the medication refill Pt daughter is wanting the medication to be sent to   [place], [address]  [place] [address]  Phone: [phone] Fax: [phone]  Hours: Not open 24 hours  Instead of [facility].

## 2021-02-26 DIAGNOSIS — H5213 Myopia, bilateral: Secondary | ICD-10-CM | POA: Diagnosis not present

## 2021-02-28 ENCOUNTER — Ambulatory Visit: Payer: Medicare Other | Admitting: Podiatry

## 2021-03-05 ENCOUNTER — Ambulatory Visit: Payer: Self-pay

## 2021-03-05 DIAGNOSIS — R600 Localized edema: Secondary | ICD-10-CM

## 2021-03-05 MED ORDER — FUROSEMIDE 20 MG PO TABS: 20.0000 mg | ORAL_TABLET | Freq: Every day | ORAL | 1 refills | Status: DC | PRN

## 2021-03-05 NOTE — Telephone Encounter (Signed)
°  Chief Complaint: Swelling to both feet,ankles Symptoms: Swelling up to calf. Toes light purple color Frequency: Started 2 weeks ago Pertinent Negatives: Patient denies shortness of breath or chest pain. Disposition: [] ED /[] Urgent Care (no appt availability in office) / [x] Appointment(In office/virtual)/ []  [facility]/ [] Home Care/ [] Refused Recommended Disposition  Additional Notes: Daughter asking if pt. Can take OTC diuretic until appointment next weak. Please advise daughter.   Reason for Disposition  [1] MILD swelling of both ankles (i.e., pedal edema) AND [2] new-onset or worsening  Answer Assessment - Initial Assessment Questions 1. ONSET: "When did the swelling start?" (e.g., minutes, hours, days)     2 weeks ago 2. LOCATION: "What part of the leg is swollen?"  "Are both legs swollen or just one leg?"     Both 3. SEVERITY: "How bad is the swelling?" (e.g., localized; mild, moderate, severe)  - Localized - small area of swelling localized to one leg  - MILD pedal edema - swelling limited to foot and ankle, pitting edema < 1/4 inch (6 mm) deep, rest and elevation eliminate most or all swelling  - MODERATE edema - swelling of lower leg to knee, pitting edema > 1/4 inch (6 mm) deep, rest and elevation only partially reduce swelling  - SEVERE edema - swelling extends above knee, facial or hand swelling present      Up to calf 4. REDNESS: "Does the swelling look red or infected?"     Looks purple 5. PAIN: "Is the swelling painful to touch?" If Yes, ask: "How painful is it?"   (Scale 1-10; mild, moderate or severe)     Moderate 6. FEVER: "Do you have a fever?" If Yes, ask: "What is it, how was it measured, and when did it start?"      No 7. CAUSE: "What do you think is causing the leg swelling?"     Unsure 8. MEDICAL HISTORY: "Do you have a history of heart failure, kidney disease, liver failure, or cancer?"     No 9. RECURRENT SYMPTOM: "Have you had leg swelling  before?" If Yes, ask: "When was the last time?" "What happened that time?"     Yes 10. OTHER SYMPTOMS: "Do you have any other symptoms?" (e.g., chest pain, difficulty breathing)       No 11. PREGNANCY: "Is there any chance you are pregnant?" "When was your last menstrual period?"       N/a  Protocols used: Leg Swelling and Edema-A-AH

## 2021-03-05 NOTE — Addendum Note (Signed)
Addended by: Olin Hauser on: 03/05/2021 11:53 AM   Modules accepted: Orders

## 2021-03-05 NOTE — Telephone Encounter (Signed)
He has had issue with lower extremity edema swelling in past.  He has tried multiple medication as well for Blood Pressure and we have run into side effects.  He has failed Thiazide diuretic (Hydrochlorothiazide) due to hyponatremia worsening electrolytes.  Please notify patient:  I would recommend order of Lasix furosemide 20mg  daily ONLY AS NEEDED for swelling / fluid. Can take one a day for few days and see how it goes, but should NOT take every day. Think of this as take for 2-3 days then stop, for 3+ days or a week then take again for 2-3 days for example.  Sent rx to [facility]  We don't want to run the risk of TOO LOW BP and Dizziness side effects.  We can see him at his upcoming apt and may end up referring to Vascular Specialist next for legs and swelling.  [name], [facility] 03/05/2021, 11:51 AM

## 2021-03-05 NOTE — Telephone Encounter (Signed)
The pt was notified of the providers recommendation. He verbalize understanding, no questions or concerns.

## 2021-03-12 ENCOUNTER — Encounter: Payer: Self-pay | Admitting: Family Medicine

## 2021-03-12 ENCOUNTER — Other Ambulatory Visit: Payer: Self-pay

## 2021-03-12 ENCOUNTER — Ambulatory Visit (INDEPENDENT_AMBULATORY_CARE_PROVIDER_SITE_OTHER): Payer: Medicare Other | Admitting: Family Medicine

## 2021-03-12 VITALS — BP 176/86 | HR 81 | Ht 75.0 in | Wt 234.6 lb

## 2021-03-12 DIAGNOSIS — I10 Essential (primary) hypertension: Secondary | ICD-10-CM | POA: Diagnosis not present

## 2021-03-12 DIAGNOSIS — R0609 Other forms of dyspnea: Secondary | ICD-10-CM | POA: Diagnosis not present

## 2021-03-12 DIAGNOSIS — F1021 Alcohol dependence, in remission: Secondary | ICD-10-CM | POA: Diagnosis not present

## 2021-03-12 DIAGNOSIS — R600 Localized edema: Secondary | ICD-10-CM

## 2021-03-12 NOTE — Progress Notes (Signed)
Subjective:    Patient ID: John Booker, male    DOB: Dec 10, 1950, 71 y.o.   MRN: [id number]  John Booker is a 71 y.o. male presenting on 03/12/2021 for Leg Swelling   HPI  Essential Hypertension Orthostatic Hypotension Hiccups   Last visit with me 12/20/20 HFU and 12/6 for HTN Edema Admitted for dehydration on 12/26/20 and discharged 12/31/20 He was considered a fall risk and having issues with orthostatic hypotension, with standing up would have bottoming out of BP in ED He was taken off Lisinopril, and switched him to Losartan Discontinued off HCTZ at that time.   Hiccups, retractable Hospitalization they adjusted med, and neurology consult. On Baclofen and Chlorpromazine (Thorazine) for hiccups with improvement Improved on current therapy  Father with history of Congestive Heart Failure  Dizziness has resolved.  Today now reports persistent Lower Ext Edema, he was last started on Furosemide 20mg  daily PRN about 3+ weeks ago, mixed results, he is urinating often on med but swelling doesn't go down much. Not wearing compression. No prior history of ECHO completed, he has fam history of CHF with Father. He has not established with Cardiology Remains off HCTZ but he had issues with sodium and hypotension on this med.  Depression screen [facility] 2/9 03/12/2021 02/12/2021 12/20/2020  Decreased Interest 1 3 0  Down, Depressed, Hopeless 1 1 0  PHQ - 2 Score 2 4 0  Altered sleeping 0 1 -  Tired, decreased energy 3 2 -  Change in appetite 0 0 -  Feeling bad or failure about yourself  0 0 -  Trouble concentrating 0 0 -  Moving slowly or fidgety/restless 2 0 -  Suicidal thoughts 0 0 -  PHQ-9 Score 7 7 -  Difficult doing work/chores Somewhat difficult Somewhat difficult -  Some recent data might be hidden    Social History   Tobacco Use   Smoking status: Former    Packs/day: 0.50    Years: 38.00    Pack years: 19.00    Types: Cigarettes   Smokeless tobacco: Former  IT trainer Use: Never used  Substance Use Topics   Alcohol use: Not Currently    Comment: several months,[place]   Drug use: No    Review of Systems Per HPI unless specifically indicated above     Objective:    BP (!) 176/86    Pulse 81    Ht 6\' 3"  (1.905 m)    Wt 234 lb 9.6 oz (106.4 kg)    SpO2 100%    BMI 29.32 kg/m   Wt Readings from Last 3 Encounters:  03/12/21 234 lb 9.6 oz (106.4 kg)  02/12/21 218 lb 9.6 oz (99.2 kg)  12/30/20 202 lb 13.2 oz (92 kg)    Physical Exam Vitals and nursing note reviewed.  Constitutional:      General: He is not in acute distress.    Appearance: He is well-developed. He is not diaphoretic.     Comments: Well-appearing, comfortable, cooperative  HENT:     Head: Normocephalic and atraumatic.  Eyes:     General:        Right eye: No discharge.        Left eye: No discharge.     Conjunctiva/sclera: Conjunctivae normal.  Neck:     Thyroid: No thyromegaly.  Cardiovascular:     Rate and Rhythm: Normal rate and regular rhythm.     Pulses: Normal pulses.     Heart  sounds: Normal heart sounds. No murmur heard. Pulmonary:     Effort: Pulmonary effort is normal. No respiratory distress.     Breath sounds: Normal breath sounds. No wheezing or rales.  Musculoskeletal:        General: Normal range of motion.     Cervical back: Normal range of motion and neck supple.     Right lower leg: Edema (+3 pitting lymphedema bilateral legs) present.     Left lower leg: Edema present.  Lymphadenopathy:     Cervical: No cervical adenopathy.  Skin:    General: Skin is warm and dry.     Findings: No erythema or rash.  Neurological:     Mental Status: He is alert and oriented to person, place, and time. Mental status is at baseline.  Psychiatric:        Behavior: Behavior normal.     Comments: Well groomed, good eye contact, normal speech and thoughts      Results for orders placed or performed during the hospital encounter of 12/26/20  Resp Panel  by RT-PCR (Flu A&B, Covid) Nasopharyngeal Swab   Specimen: Nasopharyngeal Swab; Nasopharyngeal(NP) swabs in vial transport medium  Result Value Ref Range   SARS Coronavirus 2 by RT PCR NEGATIVE NEGATIVE   Influenza A by PCR NEGATIVE NEGATIVE   Influenza B by PCR NEGATIVE NEGATIVE  Basic metabolic panel  Result Value Ref Range   Sodium 129 (L) 135 - 145 mmol/L   Potassium 4.1 3.5 - 5.1 mmol/L   Chloride 91 (L) 98 - 111 mmol/L   CO2 26 22 - 32 mmol/L   Glucose, Bld 103 (H) 70 - 99 mg/dL   BUN 22 8 - 23 mg/dL   Creatinine, Ser 1.45 (H) 0.61 - 1.24 mg/dL   Calcium 8.9 8.9 - 10.3 mg/dL   GFR, Estimated 52 (L) >60 mL/min   Anion gap 12 5 - 15  CBC  Result Value Ref Range   WBC 7.5 4.0 - 10.5 K/uL   RBC 4.75 4.22 - 5.81 MIL/uL   Hemoglobin 15.9 13.0 - 17.0 g/dL   HCT 46.9 39.0 - 52.0 %   MCV 98.7 80.0 - 100.0 fL   MCH 33.5 26.0 - 34.0 pg   MCHC 33.9 30.0 - 36.0 g/dL   RDW 15.4 11.5 - 15.5 %   Platelets 157 150 - 400 K/uL   nRBC 0.0 0.0 - 0.2 %  Urinalysis, Complete w Microscopic Urine, Clean Catch  Result Value Ref Range   Color, Urine AMBER (A) YELLOW   APPearance HAZY (A) CLEAR   Specific Gravity, Urine 1.019 1.005 - 1.030   pH 5.0 5.0 - 8.0   Glucose, UA NEGATIVE NEGATIVE mg/dL   Hgb urine dipstick MODERATE (A) NEGATIVE   Bilirubin Urine NEGATIVE NEGATIVE   Ketones, ur 5 (A) NEGATIVE mg/dL   Protein, ur 30 (A) NEGATIVE mg/dL   Nitrite NEGATIVE NEGATIVE   Leukocytes,Ua NEGATIVE NEGATIVE   RBC / HPF 0-5 0 - 5 RBC/hpf   WBC, UA 0-5 0 - 5 WBC/hpf   Bacteria, UA RARE (A) NONE SEEN   Squamous Epithelial / LPF 0-5 0 - 5   Mucus PRESENT    Hyaline Casts, UA PRESENT   Lipase, blood  Result Value Ref Range   Lipase 26 11 - 51 U/L  Hepatic function panel  Result Value Ref Range   Total Protein 7.0 6.5 - 8.1 g/dL   Albumin 3.7 3.5 - 5.0 g/dL   AST 98 (H) 15 -  41 U/L   ALT 27 0 - 44 U/L   Alkaline Phosphatase 78 38 - 126 U/L   Total Bilirubin 1.8 (H) 0.3 - 1.2 mg/dL    Bilirubin, Direct 0.3 (H) 0.0 - 0.2 mg/dL   Indirect Bilirubin 1.5 (H) 0.3 - 0.9 mg/dL  Osmolality  Result Value Ref Range   Osmolality 277 275 - 295 mOsm/kg  Brain natriuretic peptide  Result Value Ref Range   B Natriuretic Peptide 218.2 (H) 0.0 - 100.0 pg/mL  Vitamin B12  Result Value Ref Range   Vitamin B-12 350 180 - 914 pg/mL  Phosphorus  Result Value Ref Range   Phosphorus 2.5 2.5 - 4.6 mg/dL  TSH  Result Value Ref Range   TSH 3.100 0.350 - 4.500 uIU/mL  Vitamin B1  Result Value Ref Range   Vitamin B1 (Thiamine) 95.0 66.5 - 200.0 nmol/L  Ethanol  Result Value Ref Range   Alcohol, Ethyl (B) <10 <10 mg/dL  Miscellaneous [facility] test (send-out)  Result Value Ref Range   [facility] test code [id number]    [facility] test name B3    Misc [facility] result COMMENT   Vitamin A  Result Value Ref Range   Vitamin A (Retinoic Acid) 30.0 22.0 - 69.5 ug/dL  Vitamin E  Result Value Ref Range   Vitamin E (Alpha Tocopherol) 7.6 (L) 9.0 - 29.0 mg/L   Vitamin E(Gamma Tocopherol) 1.3 0.5 - 4.9 mg/L  Vitamin K1, Serum  Result Value Ref Range   VITAMIN K1 <0.10 (L) 0.10 - 2.20 ng/mL  VITAMIN D 25 Hydroxy (Vit-D Deficiency, Fractures)  Result Value Ref Range   Vit D, 25-Hydroxy 14.56 (L) 30 - 100 ng/mL  Zinc  Result Value Ref Range   Zinc 40 (L) 44 - 115 ug/dL  Copper, serum  Result Value Ref Range   Copper 117 69 - 132 ug/dL  Magnesium  Result Value Ref Range   Magnesium 1.5 (L) 1.7 - 2.4 mg/dL  Basic metabolic panel  Result Value Ref Range   Sodium 132 (L) 135 - 145 mmol/L   Potassium 3.3 (L) 3.5 - 5.1 mmol/L   Chloride 97 (L) 98 - 111 mmol/L   CO2 27 22 - 32 mmol/L   Glucose, Bld 85 70 - 99 mg/dL   BUN 23 8 - 23 mg/dL   Creatinine, Ser 1.52 (H) 0.61 - 1.24 mg/dL   Calcium 8.4 (L) 8.9 - 10.3 mg/dL   GFR, Estimated 49 (L) >60 mL/min   Anion gap 8 5 - 15  CBC  Result Value Ref Range   WBC 4.0 4.0 - 10.5 K/uL   RBC 3.99 (L) 4.22 - 5.81 MIL/uL   Hemoglobin 13.9 13.0 - 17.0 g/dL    HCT 40.2 39.0 - 52.0 %   MCV 100.8 (H) 80.0 - 100.0 fL   MCH 34.8 (H) 26.0 - 34.0 pg   MCHC 34.6 30.0 - 36.0 g/dL   RDW 15.3 11.5 - 15.5 %   Platelets 129 (L) 150 - 400 K/uL   nRBC 0.0 0.0 - 0.2 %  Vitamin B6  Result Value Ref Range   Vitamin B6 4.8 3.4 - 65.2 ug/L  Magnesium  Result Value Ref Range   Magnesium 1.7 1.7 - 2.4 mg/dL  Basic metabolic panel  Result Value Ref Range   Sodium 132 (L) 135 - 145 mmol/L   Potassium 3.4 (L) 3.5 - 5.1 mmol/L   Chloride 100 98 - 111 mmol/L   CO2 26 22 - 32 mmol/L  Glucose, Bld 93 70 - 99 mg/dL   BUN 24 (H) 8 - 23 mg/dL   Creatinine, Ser 1.13 0.61 - 1.24 mg/dL   Calcium 8.1 (L) 8.9 - 10.3 mg/dL   GFR, Estimated >60 >60 mL/min   Anion gap 6 5 - 15  CBC  Result Value Ref Range   WBC 3.7 (L) 4.0 - 10.5 K/uL   RBC 3.71 (L) 4.22 - 5.81 MIL/uL   Hemoglobin 12.6 (L) 13.0 - 17.0 g/dL   HCT 36.7 (L) 39.0 - 52.0 %   MCV 98.9 80.0 - 100.0 fL   MCH 34.0 26.0 - 34.0 pg   MCHC 34.3 30.0 - 36.0 g/dL   RDW 15.3 11.5 - 15.5 %   Platelets 120 (L) 150 - 400 K/uL   nRBC 0.0 0.0 - 0.2 %  Magnesium  Result Value Ref Range   Magnesium 1.7 1.7 - 2.4 mg/dL  CBC  Result Value Ref Range   WBC 8.1 4.0 - 10.5 K/uL   RBC 3.98 (L) 4.22 - 5.81 MIL/uL   Hemoglobin 13.4 13.0 - 17.0 g/dL   HCT 39.5 39.0 - 52.0 %   MCV 99.2 80.0 - 100.0 fL   MCH 33.7 26.0 - 34.0 pg   MCHC 33.9 30.0 - 36.0 g/dL   RDW 14.9 11.5 - 15.5 %   Platelets 132 (L) 150 - 400 K/uL   nRBC 0.0 0.0 - 0.2 %  Comprehensive metabolic panel  Result Value Ref Range   Sodium 134 (L) 135 - 145 mmol/L   Potassium 4.0 3.5 - 5.1 mmol/L   Chloride 100 98 - 111 mmol/L   CO2 29 22 - 32 mmol/L   Glucose, Bld 90 70 - 99 mg/dL   BUN 16 8 - 23 mg/dL   Creatinine, Ser 1.11 0.61 - 1.24 mg/dL   Calcium 8.1 (L) 8.9 - 10.3 mg/dL   Total Protein 5.4 (L) 6.5 - 8.1 g/dL   Albumin 2.8 (L) 3.5 - 5.0 g/dL   AST 38 15 - 41 U/L   ALT 25 0 - 44 U/L   Alkaline Phosphatase 45 38 - 126 U/L   Total Bilirubin 1.2  0.3 - 1.2 mg/dL   GFR, Estimated >60 >60 mL/min   Anion gap 5 5 - 15  Basic metabolic panel  Result Value Ref Range   Sodium 134 (L) 135 - 145 mmol/L   Potassium 3.8 3.5 - 5.1 mmol/L   Chloride 100 98 - 111 mmol/L   CO2 32 22 - 32 mmol/L   Glucose, Bld 106 (H) 70 - 99 mg/dL   BUN 15 8 - 23 mg/dL   Creatinine, Ser 0.94 0.61 - 1.24 mg/dL   Calcium 8.5 (L) 8.9 - 10.3 mg/dL   GFR, Estimated >60 >60 mL/min   Anion gap 2 (L) 5 - 15  CBC  Result Value Ref Range   WBC 6.7 4.0 - 10.5 K/uL   RBC 3.74 (L) 4.22 - 5.81 MIL/uL   Hemoglobin 12.8 (L) 13.0 - 17.0 g/dL   HCT 38.1 (L) 39.0 - 52.0 %   MCV 101.9 (H) 80.0 - 100.0 fL   MCH 34.2 (H) 26.0 - 34.0 pg   MCHC 33.6 30.0 - 36.0 g/dL   RDW 15.0 11.5 - 15.5 %   Platelets 147 (L) 150 - 400 K/uL   nRBC 0.0 0.0 - 0.2 %  Magnesium  Result Value Ref Range   Magnesium 1.8 1.7 - 2.4 mg/dL  Basic metabolic panel  Result Value  Ref Range   Sodium 135 135 - 145 mmol/L   Potassium 3.6 3.5 - 5.1 mmol/L   Chloride 95 (L) 98 - 111 mmol/L   CO2 27 22 - 32 mmol/L   Glucose, Bld 103 (H) 70 - 99 mg/dL   BUN 18 8 - 23 mg/dL   Creatinine, Ser 0.98 0.61 - 1.24 mg/dL   Calcium 8.6 (L) 8.9 - 10.3 mg/dL   GFR, Estimated >60 >60 mL/min   Anion gap 13 5 - 15  Surgical pathology  Result Value Ref Range   SURGICAL PATHOLOGY      SURGICAL PATHOLOGY CASE: [id number] PATIENT: [facility] Surgical Pathology Report     Specimen Submitted: A. Duodenum; cbx B. Stomach; cbx  Clinical History: Intractable hiccups, weight loss, abnormal barium swallow. Findings: Gastritis/duodenitis (mild)     DIAGNOSIS: A.  DUODENUM; COLD BIOPSY: - NONSPECIFIC CHRONIC DUODENITIS WITH MILD INACTIVE INFLAMMATION, GASTRIC METAPLASIA, AND BRUNNER'S GLAND HYPERPLASIA. - VILLOUS ARCHITECTURE IS GENERALLY PRESERVED. - NEGATIVE FOR INTRAEPITHELIAL LYMPHOCYTOSIS, DYSPLASIA, AND MALIGNANCY.  B.  STOMACH; COLD BIOPSY: - MILD REACTIVE GASTROPATHY AND CONGESTION. - NEGATIVE  FOR ACTIVE INFLAMMATION, H. PYLORI, INTESTINAL METAPLASIA, DYSPLASIA, AND MALIGNANCY.  GROSS DESCRIPTION: A. Labeled: CBX duodenum for duodenitis Received: In formalin Collection time: 11:18 AM on 12/28/2020 Placed into formalin time: 11:19 AM on 12/28/2020 Tissue fragment(s): 2 Size: Up to 0.5 and 0.4 cm Description: Received are 2 fragm ents of pink-tan soft tissue. Entirely submitted in cassette 1.  B. Labeled: CBX gastric for gastritis Received: In formalin Collection time: 11:25 AM on 12/28/2020 Placed into formalin time: 11:25 AM on 12/28/2020 Tissue fragment(s): 1 Size: 0.5 x 0.4 x 0.1 cm Description: Received is a single fragment of pink-tan soft tissue. Entirely submitted in cassette 1.  BAS 12/28/2020   Final Diagnosis performed by Bryan Lemma, MD.   Electronically signed 12/31/2020 7:51:41PM The electronic signature indicates that the named Attending Pathologist has evaluated the specimen Technical component performed at [facility], [address], [place], [place] [address] Lab: [phone] Dir: Rush Farmer, MD, MMM  Professional component performed at [facility], [facility], [place], [place], [place] [address] Lab: [phone] Dir: Kathi Simpers, MD   Troponin I (High Sensitivity)  Result Value Ref Range   Troponin I (High Sensitivity) 31 (H) <18 ng/L  Troponin I (High Sensitivity)  Result Value Ref Range   Troponin I (High Sensitivity) 22 (H) <18 ng/L  Troponin I (High Sensitivity)  Result Value Ref Range   Troponin I (High Sensitivity) 24 (H) <18 ng/L      Assessment & Plan:   Problem List Items Addressed This Visit     Essential hypertension - Primary   Relevant Medications   losartan (COZAAR) 100 MG tablet   Other Relevant Orders   Ambulatory referral to Cardiology   ECHOCARDIOGRAM COMPLETE   Bilateral lower extremity edema   Relevant Orders   Ambulatory referral to Cardiology   ECHOCARDIOGRAM COMPLETE   Alcohol  dependence in sustained full remission [facility])   Other Visit Diagnoses     Dyspnea on exertion       Relevant Orders   Ambulatory referral to Cardiology   ECHOCARDIOGRAM COMPLETE      BP elevated. Persistent extensive LE Edema vs Lymphedema. Fatigue vs dyspnea on exertion and some decline in function Could be weakness due to extensive swelling fluid gain in legs.  Remains off HCTZ, may have been helping control his swelling, however it caused hypotension and hyponatremia.  Hx of Alcohol  Dependence, may have impacted heart function. Fam history of CHF.  Will adjust meds today and order ECHOcardiogram and Referral to Cardiology for further eval.  Dose increase Losartan from 50 to 100mg  daily, double current, order new rx 100mg  tab daily when ready  May dose adjust Lasix 20mg  daily as well, can go to daily for 7 days then pause for few days to week, see if helps, IF need we can double dose to 40mg  (x 2 of the 20mg ) for higher dose temporarily to see if this would work.  Referral to [place]. Likely seen after ECHO is complete.  Ultimately, advised may need to see Vascular specialist, if he has more Lymphedema concerns this may need vascular consult.  Recommend compression stockings   No orders of the defined types were placed in this encounter.  Orders Placed This Encounter  Procedures   Ambulatory referral to Cardiology    Referral Priority:   Routine    Referral Type:   Consultation    Referral Reason:   Specialty Services Required    Requested Specialty:   Cardiology    Number of Visits Requested:   1   ECHOCARDIOGRAM COMPLETE    Standing Status:   Future    Standing Expiration Date:   03/12/2022    Order Specific Question:   Where should this test be performed    Answer:   [facility]    Order Specific Question:   Perflutren DEFINITY (image enhancing agent) should be administered unless hypersensitivity or allergy exist    Answer:   Administer  Perflutren    Order Specific Question:   Is a special reader required? (athlete or structural heart)    Answer:   No    Order Specific Question:   Does this study need to be read by the Structural team/Level 3 readers?    Answer:   No    Order Specific Question:   Reason for exam-Echo    Answer:   Dyspnea  R06.00    Order Specific Question:   Release to patient    Answer:   Immediate      Follow up plan: Return if symptoms worsen or fail to improve.   [name], [facility] 03/12/2021, 11:24 AM

## 2021-03-12 NOTE — Patient Instructions (Addendum)
Thank you for coming to the office today.  Referral to Cardiology for further evaluation of heart.  Ordered ECHOcardiogram.  [facility]) HeartCare at [place], [place] [address] Main: [phone]   Stay tuned for apt from Cardiologist  Increase Losartan from 50mg  up to x 2 = 100mg  daily, try this for now for elevated BP. Goal to adjust further to other meds if need.  Increase Furosemide (Lasix) 20mg  daily to up to ONCE DAILY for 7 days. See if this helps.  If still not helping, you have permission to take TWO Lasix 20mg  x 2 = 40mg  at one time daily for a few days at a time to see if this helps.  Try a few different options here to see what works. And then follow-up with the heart doctors.  Future we may end up consulting a Vein / Vascular specialist as well.  Use RICE therapy: - R - Rest / relative rest with activity modification avoid overuse of joint - I - Ice packs (make sure you use a towel or sock / something to protect skin) - C - Compression with stockings and elevation - E - Elevation - if significant swelling, lift leg above heart level (toes above your nose) to help reduce swelling, most helpful at night after day of being on your feet    Please schedule a Follow-up Appointment to: Return if symptoms worsen or fail to improve.  If you have any other questions or concerns, please feel free to call the office or send a message through [place]. You may also schedule an earlier appointment if necessary.  Additionally, you may be receiving a survey about your experience at our office within a few days to 1 week by e-mail or mail. We value your feedback.  [name], DO [place]

## 2021-04-04 ENCOUNTER — Other Ambulatory Visit: Payer: Self-pay | Admitting: Family Medicine

## 2021-04-04 DIAGNOSIS — R600 Localized edema: Secondary | ICD-10-CM

## 2021-04-04 DIAGNOSIS — R0609 Other forms of dyspnea: Secondary | ICD-10-CM

## 2021-04-08 ENCOUNTER — Telehealth: Payer: Self-pay | Admitting: Family Medicine

## 2021-04-08 NOTE — Telephone Encounter (Signed)
Left message for patient to call back and schedule Medicare Annual Wellness Visit (AWV) to be done virtually or by telephone.  No hx of AWV eligible as of 02/08/15  Please schedule at anytime with [facility].      40 Minutes appointment   Any questions, please call me at [phone]

## 2021-04-11 ENCOUNTER — Other Ambulatory Visit: Payer: Self-pay

## 2021-04-11 ENCOUNTER — Ambulatory Visit (INDEPENDENT_AMBULATORY_CARE_PROVIDER_SITE_OTHER): Payer: Medicare Other

## 2021-04-11 DIAGNOSIS — R0609 Other forms of dyspnea: Secondary | ICD-10-CM | POA: Diagnosis not present

## 2021-04-11 DIAGNOSIS — R600 Localized edema: Secondary | ICD-10-CM

## 2021-04-11 LAB — ECHOCARDIOGRAM COMPLETE
AR max vel: 3.93 cm2
AV Area VTI: 4 cm2
AV Area mean vel: 3.91 cm2
AV Mean grad: 2 mmHg
AV Peak grad: 4.2 mmHg
Ao pk vel: 1.03 m/s
Area-P 1/2: 3.53 cm2
Calc EF: 55.9 %
S' Lateral: 2.6 cm
Single Plane A2C EF: 61.1 %
Single Plane A4C EF: 56 %

## 2021-04-16 NOTE — Progress Notes (Signed)
I spoke with the patients daughter and discussed the results and the current treatment plan that Dr. Raliegh Ip has advised

## 2021-04-23 ENCOUNTER — Other Ambulatory Visit: Payer: Self-pay | Admitting: Family Medicine

## 2021-04-23 DIAGNOSIS — I10 Essential (primary) hypertension: Secondary | ICD-10-CM

## 2021-04-23 NOTE — Telephone Encounter (Signed)
Requested medications are due for refill today.  no  Requested medications are on the active medications list.  no  Last refill. 02/12/2021  Future visit scheduled.   yes  Notes to clinic.  Dosage changed.    Requested Prescriptions  Pending Prescriptions Disp Refills   losartan (COZAAR) 50 MG tablet [Pharmacy Med Name: LOSARTAN POTASSIUM 50 MG TAB] 30 tablet 2    Sig: TAKE 1 TABLET BY MOUTH DAILY     Cardiovascular:  Angiotensin Receptor Blockers Failed - 04/23/2021 11:11 AM      Failed - Last BP in normal range    BP Readings from Last 1 Encounters:  03/12/21 (!) 176/86          Passed - Cr in normal range and within 180 days    Creat  Date Value Ref Range Status  12/20/2020 1.15 0.70 - 1.28 mg/dL Final   Creatinine, Ser  Date Value Ref Range Status  12/31/2020 0.98 0.61 - 1.24 mg/dL Final          Passed - K in normal range and within 180 days    Potassium  Date Value Ref Range Status  12/31/2020 3.6 3.5 - 5.1 mmol/L Final  04/15/2012 3.5 3.5 - 5.1 mmol/L Final          Passed - Patient is not pregnant      Passed - Valid encounter within last 6 months    Recent Outpatient Visits           1 month ago Essential hypertension   [facility] Olin Hauser, DO   2 months ago Essential hypertension   [place], Devonne Doughty, DO   4 months ago Annual physical exam   [facility] Olin Hauser, DO   10 months ago Essential hypertension   [place], Devonne Doughty, DO   1 year ago Essential hypertension   [place], Devonne Doughty, DO       Future Appointments             In 1 month [place], Devonne Doughty, [facility], [facility]

## 2021-05-07 ENCOUNTER — Ambulatory Visit: Payer: Self-pay

## 2021-05-07 NOTE — Telephone Encounter (Signed)
I am not sure how soon we can call back with detailed information, currently busy with patients in office.  I believe there is some confusion. They may be referred to ECHOcardiogram ? Not ECG?  He had ECHO done that I ordered on 04/12/21.  He was also referred to Cardiologist on 03/12/21.  I don't see the apt scheduled yet with Cardiology.  I reviewed his ECHO back on 04/12/21 when it was done.  Here is the copy of my result note  "ECHOcardiogram showed no significant evidence of heart failure or other issues with heart valves.   Next step is to follow-up with Cardiologist as they should be scheduling soon and they can go over this test in more detail.   After the heart is fully checked out and evaluated, then we may end up referring to Vascular doctors, they are the artery/vein circulation specialists and this may be causing the swelling. But first we will finish evaluating the heart before that referral."  [name], [facility] 05/07/2021, 2:43 PM

## 2021-05-07 NOTE — Telephone Encounter (Signed)
°  Chief Complaint: Needs Follow up on ECG results Symptoms: ongoing leg swelling Frequency: ongoing Pertinent Negatives: Patient denies  Disposition: [] ED /[] Urgent Care (no appt availability in office) / [] Appointment(In office/virtual)/ []  [place]/ [] Home Care/ [] Refused Recommended Disposition /[] [place] [facility]/ [x]  Follow-up with PCP    Additional Notes: Spoke with Pt's daighter Caressa.  She is following up on ECG pt had earlier. She was called regarding ECG results, and was told to follow up with vascular specialist. Daughter is unsure of Dr. Marthann Schiller recommendations for follow up and needs a return call ASAP to know how to proceed with father's care.   Reason for Disposition  [1] Caller is not with the adult (patient) AND [2] probable NON-URGENT symptoms  Answer Assessment - Initial Assessment Questions 1. REASON FOR CALL or QUESTION: "What is your reason for calling today?" or "How can I best help you?" or "What question do you have that I can help answer?"     Needs follow up on ECG  Protocols used: Information Only Call - No Triage-A-AH

## 2021-05-10 ENCOUNTER — Encounter: Payer: Self-pay | Admitting: Family Medicine

## 2021-05-10 DIAGNOSIS — R945 Abnormal results of liver function studies: Secondary | ICD-10-CM

## 2021-05-10 DIAGNOSIS — R770 Abnormality of albumin: Secondary | ICD-10-CM

## 2021-05-10 DIAGNOSIS — I10 Essential (primary) hypertension: Secondary | ICD-10-CM

## 2021-05-10 DIAGNOSIS — F1021 Alcohol dependence, in remission: Secondary | ICD-10-CM

## 2021-05-10 DIAGNOSIS — R600 Localized edema: Secondary | ICD-10-CM

## 2021-05-12 ENCOUNTER — Other Ambulatory Visit: Payer: Self-pay | Admitting: Family Medicine

## 2021-05-12 ENCOUNTER — Encounter: Payer: Self-pay | Admitting: Family Medicine

## 2021-05-12 DIAGNOSIS — R066 Hiccough: Secondary | ICD-10-CM

## 2021-05-12 DIAGNOSIS — I10 Essential (primary) hypertension: Secondary | ICD-10-CM

## 2021-05-13 MED ORDER — LOSARTAN POTASSIUM 100 MG PO TABS: 100.0000 mg | ORAL_TABLET | Freq: Every day | ORAL | 1 refills | Status: AC

## 2021-05-13 MED ORDER — CHLORPROMAZINE HCL 10 MG PO TABS: 10.0000 mg | ORAL_TABLET | Freq: Two times a day (BID) | ORAL | 2 refills | Status: DC

## 2021-05-13 MED ORDER — BACLOFEN 10 MG PO TABS: 10.0000 mg | ORAL_TABLET | Freq: Two times a day (BID) | ORAL | 3 refills | Status: DC

## 2021-05-21 ENCOUNTER — Telehealth: Payer: Self-pay | Admitting: Family Medicine

## 2021-05-21 NOTE — Telephone Encounter (Signed)
N/A unable to leave a message for patient to call back and schedule Medicare Annual Wellness Visit (AWV) to be done virtually or by telephone. ? ?No hx of AWV eligible as of 02/08/15 ? ?Please schedule at anytime with [facility].     ? ?40 Minutes appointment  ? ?Any questions, please call me at [phone]  ?

## 2021-06-05 DIAGNOSIS — M216X1 Other acquired deformities of right foot: Secondary | ICD-10-CM | POA: Diagnosis not present

## 2021-06-05 DIAGNOSIS — L97511 Non-pressure chronic ulcer of other part of right foot limited to breakdown of skin: Secondary | ICD-10-CM | POA: Diagnosis not present

## 2021-06-18 ENCOUNTER — Encounter (INDEPENDENT_AMBULATORY_CARE_PROVIDER_SITE_OTHER): Payer: Medicare Other | Admitting: Nurse Practitioner

## 2021-06-20 ENCOUNTER — Ambulatory Visit: Payer: Medicare Other | Admitting: Family Medicine

## 2021-06-20 ENCOUNTER — Telehealth: Payer: Self-pay | Admitting: Family Medicine

## 2021-06-20 ENCOUNTER — Ambulatory Visit: Payer: Self-pay | Admitting: *Deleted

## 2021-06-20 ENCOUNTER — Encounter (INDEPENDENT_AMBULATORY_CARE_PROVIDER_SITE_OTHER): Payer: Self-pay | Admitting: Nurse Practitioner

## 2021-06-20 ENCOUNTER — Ambulatory Visit (INDEPENDENT_AMBULATORY_CARE_PROVIDER_SITE_OTHER): Payer: Medicare Other | Admitting: Nurse Practitioner

## 2021-06-20 VITALS — BP 179/112 | HR 79 | Resp 16 | Wt 232.8 lb

## 2021-06-20 DIAGNOSIS — I10 Essential (primary) hypertension: Secondary | ICD-10-CM | POA: Diagnosis not present

## 2021-06-20 DIAGNOSIS — R600 Localized edema: Secondary | ICD-10-CM

## 2021-06-20 NOTE — Telephone Encounter (Signed)
?  Chief Complaint: patient's daughter not with patient now. C/o elevated BP and headache ?Symptoms: bad headache, BP 210/119 and rechecked for 179/112 when leaving Vein clinic.  ?Frequency: na ?Pertinent Negatives: Patient denies visual issues, weakness on either side of body  ?Disposition: '[x]'$ ED /'[]'$ Urgent Care (no appt availability in office) / '[]'$ Appointment(In office/virtual)/ '[]'$  [facility]/ '[]'$ Home Care/ '[x]'$ Refused Recommended Disposition /'[]'$ [facility]/ '[x]'$  Follow-up with PCP ?Additional Notes:  ? ?Patient's daughter requesting if patient can take Rx still left over and within date HCTZ for elevated BP. After hospital discharge patient was taken off of HCTZ and BP has prgressively elevated. Attempted to contact clinic and no answer. Please advise and call daughter leave message or send My chart message.  ? ? ? Reason for Disposition ? [0] Systolic BP  >= 100 OR Diastolic >= 712 AND [1] cardiac or neurologic symptoms (e.g., chest pain, difficulty breathing, unsteady gait, blurred vision) ? ?Answer Assessment - Initial Assessment Questions ?1. BLOOD PRESSURE: "What is the blood pressure?" "Did you take at least two measurements 5 minutes apart?" ?    BP 210/119 ?2. ONSET: "When did you take your blood pressure?" ?    Taken at visit with [facility]in and vascular  center. ?3. HOW: "How did you obtain the blood pressure?" (e.g., visiting nurse, automatic home BP monitor) ?    na ?4. HISTORY: "Do you have a history of high blood pressure?" ?    yes ?5. MEDICATIONS: "Are you taking any medications for blood pressure?" "Have you missed any doses recently?" ?    Yes only losartan ?6. OTHER SYMPTOMS: "Do you have any symptoms?" (e.g., headache, chest pain, blurred vision, difficulty breathing, weakness) ?    Bad headache . Daughter not with patient now  ?7. PREGNANCY: "Is there any chance you are pregnant?" "When was your last menstrual period?" ?    na ? ?Protocols used: Blood Pressure - High-A-AH ? ?

## 2021-06-20 NOTE — Telephone Encounter (Signed)
Attempted to call patient around 5pm today to review his BP medication following Vascular visit today with Eulogio Ditch NP. We have corresponded on [facility] regarding his recent elevated BP reading in her office today and options for BP management at this time. ? ?I was unable to reach patient on both mobile and home number. ? ?I am not confident that he would do well back on the HCTZ Thiazide diuretic. Even though it may help control his BP. He had Hyponatremia and orthostatic symptoms previously. ? ?I would suggest trial on Spironolactone '25mg'$  daily to start for better BP management in this setting. I can follow up with him as scheduled in 2 weeks and we can repeat lab for chemistry monitoring. ? ?[name], DO ?[facility] ?[facility] ?06/20/2021, 5:02 PM ? ? ? ?

## 2021-06-21 ENCOUNTER — Telehealth: Payer: Self-pay

## 2021-06-21 MED ORDER — SPIRONOLACTONE 25 MG PO TABS: 25.0000 mg | ORAL_TABLET | Freq: Every day | ORAL | 2 refills | Status: DC

## 2021-06-21 NOTE — Addendum Note (Signed)
Addended by: Olin Hauser on: 06/21/2021 03:03 PM ? ? Modules accepted: Orders ? ?

## 2021-06-21 NOTE — Telephone Encounter (Signed)
His daughter, Orson Slick is aware of Dr. Marthann Schiller previous message concerning his treatment.  ?

## 2021-06-21 NOTE — Telephone Encounter (Signed)
Daughter given message from PCP 06/20/21. States she would like him started on new medication. Please advise daughter when this is sent to [facility]. Caressa Thaxton [phone].  ?

## 2021-06-21 NOTE — Telephone Encounter (Signed)
Note this is a duplicate call message. I have copied my response from yesterday here as well. Can you please contact patient. ? ?I was unable to reach patient on both mobile and home number yesterday. ?  ?I am not confident that he would do well back on the HCTZ Thiazide diuretic. Even though it may help control his BP. He had Hyponatremia and orthostatic symptoms previously. ?  ?I would suggest trial on Spironolactone '25mg'$  daily to start for better BP management in this setting. ? ?Let me know if they are okay to try Spironolactone new med, and I can order it now. ? ?I can follow up with him as scheduled in 2 weeks and we can repeat lab for chemistry monitoring. ? ?If severe symptoms BP >200 with headache then I would recommend ED for quick treatment. ?  ?[name], DO ?[facility] ?[facility] ?06/20/2021, 5:02 PM ?

## 2021-06-26 ENCOUNTER — Other Ambulatory Visit: Payer: Self-pay

## 2021-06-26 ENCOUNTER — Observation Stay: Payer: Medicare Other

## 2021-06-26 ENCOUNTER — Emergency Department: Payer: Medicare Other

## 2021-06-26 ENCOUNTER — Encounter: Payer: Self-pay | Admitting: Emergency Medicine

## 2021-06-26 ENCOUNTER — Observation Stay
Admission: EM | Admit: 2021-06-26 | Discharge: 2021-06-27 | Disposition: A | Discharge status: Home / Self Care | Payer: Medicare Other | Attending: Internal Medicine | Admitting: Internal Medicine

## 2021-06-26 DIAGNOSIS — R0789 Other chest pain: Secondary | ICD-10-CM | POA: Diagnosis not present

## 2021-06-26 DIAGNOSIS — I110 Hypertensive heart disease with heart failure: Secondary | ICD-10-CM | POA: Diagnosis not present

## 2021-06-26 DIAGNOSIS — J189 Pneumonia, unspecified organism: Principal | ICD-10-CM | POA: Insufficient documentation

## 2021-06-26 DIAGNOSIS — Z87891 Personal history of nicotine dependence: Secondary | ICD-10-CM | POA: Diagnosis not present

## 2021-06-26 DIAGNOSIS — R519 Headache, unspecified: Secondary | ICD-10-CM | POA: Insufficient documentation

## 2021-06-26 DIAGNOSIS — Z79899 Other long term (current) drug therapy: Secondary | ICD-10-CM | POA: Diagnosis not present

## 2021-06-26 DIAGNOSIS — M7989 Other specified soft tissue disorders: Secondary | ICD-10-CM | POA: Diagnosis not present

## 2021-06-26 DIAGNOSIS — Z20822 Contact with and (suspected) exposure to covid-19: Secondary | ICD-10-CM | POA: Insufficient documentation

## 2021-06-26 DIAGNOSIS — R7989 Other specified abnormal findings of blood chemistry: Secondary | ICD-10-CM | POA: Diagnosis not present

## 2021-06-26 DIAGNOSIS — R030 Elevated blood-pressure reading, without diagnosis of hypertension: Secondary | ICD-10-CM | POA: Diagnosis not present

## 2021-06-26 DIAGNOSIS — I6782 Cerebral ischemia: Secondary | ICD-10-CM | POA: Diagnosis not present

## 2021-06-26 DIAGNOSIS — I517 Cardiomegaly: Secondary | ICD-10-CM | POA: Diagnosis not present

## 2021-06-26 DIAGNOSIS — G319 Degenerative disease of nervous system, unspecified: Secondary | ICD-10-CM | POA: Diagnosis not present

## 2021-06-26 DIAGNOSIS — R600 Localized edema: Secondary | ICD-10-CM

## 2021-06-26 DIAGNOSIS — I5032 Chronic diastolic (congestive) heart failure: Secondary | ICD-10-CM | POA: Diagnosis not present

## 2021-06-26 DIAGNOSIS — R079 Chest pain, unspecified: Secondary | ICD-10-CM | POA: Diagnosis present

## 2021-06-26 DIAGNOSIS — F1010 Alcohol abuse, uncomplicated: Secondary | ICD-10-CM | POA: Diagnosis present

## 2021-06-26 DIAGNOSIS — I10 Essential (primary) hypertension: Secondary | ICD-10-CM | POA: Diagnosis not present

## 2021-06-26 LAB — URINE DRUG SCREEN, QUALITATIVE (ARMC ONLY)
Amphetamines, Ur Screen: NOT DETECTED
Barbiturates, Ur Screen: NOT DETECTED
Benzodiazepine, Ur Scrn: NOT DETECTED
Cannabinoid 50 Ng, Ur ~~LOC~~: NOT DETECTED
Cocaine Metabolite,Ur ~~LOC~~: NOT DETECTED
MDMA (Ecstasy)Ur Screen: NOT DETECTED
Methadone Scn, Ur: NOT DETECTED
Opiate, Ur Screen: POSITIVE — AB
Phencyclidine (PCP) Ur S: NOT DETECTED
Tricyclic, Ur Screen: NOT DETECTED

## 2021-06-26 LAB — TROPONIN I (HIGH SENSITIVITY)
Troponin I (High Sensitivity): 8 ng/L (ref <18)
Troponin I (High Sensitivity): 7 ng/L (ref <18)

## 2021-06-26 LAB — CBC
HCT: 51.5 % (ref 39.0–52.0)
Hemoglobin: 16.7 g/dL (ref 13.0–17.0)
MCH: 30.6 pg (ref 26.0–34.0)
MCHC: 32.4 g/dL (ref 30.0–36.0)
MCV: 94.5 fL (ref 80.0–100.0)
Platelets: 177 10*3/uL (ref 150–400)
RBC: 5.45 MIL/uL (ref 4.22–5.81)
RDW: 13.2 % (ref 11.5–15.5)
WBC: 10.4 10*3/uL (ref 4.0–10.5)
nRBC: 0 % (ref 0.0–0.2)

## 2021-06-26 LAB — HEPATIC FUNCTION PANEL
ALT: 15 U/L (ref 0–44)
AST: 18 U/L (ref 15–41)
Albumin: 4.3 g/dL (ref 3.5–5.0)
Alkaline Phosphatase: 54 U/L (ref 38–126)
Bilirubin, Direct: 0.3 mg/dL — ABNORMAL HIGH (ref 0.0–0.2)
Indirect Bilirubin: 1.2 mg/dL — ABNORMAL HIGH (ref 0.3–0.9)
Total Bilirubin: 1.5 mg/dL — ABNORMAL HIGH (ref 0.3–1.2)
Total Protein: 7.2 g/dL (ref 6.5–8.1)

## 2021-06-26 LAB — BASIC METABOLIC PANEL
Anion gap: 10 (ref 5–15)
BUN: 22 mg/dL (ref 8–23)
CO2: 26 mmol/L (ref 22–32)
Calcium: 9.3 mg/dL (ref 8.9–10.3)
Chloride: 100 mmol/L (ref 98–111)
Creatinine, Ser: 0.95 mg/dL (ref 0.61–1.24)
GFR, Estimated: >60 mL/min (ref >60)
Glucose, Bld: 133 mg/dL — ABNORMAL HIGH (ref 70–99)
Potassium: 3.7 mmol/L (ref 3.5–5.1)
Sodium: 136 mmol/L (ref 135–145)

## 2021-06-26 LAB — RESP PANEL BY RT-PCR (FLU A&B, COVID) ARPGX2
Influenza A by PCR: NEGATIVE
Influenza B by PCR: NEGATIVE
SARS Coronavirus 2 by RT PCR: NEGATIVE

## 2021-06-26 LAB — PROCALCITONIN: Procalcitonin: 3.19 ng/mL

## 2021-06-26 LAB — HEMOGLOBIN A1C
Hgb A1c MFr Bld: 5.3 % (ref 4.8–5.6)
Mean Plasma Glucose: 105.41 mg/dL

## 2021-06-26 LAB — LIPASE, BLOOD: Lipase: 30 U/L (ref 11–51)

## 2021-06-26 LAB — D-DIMER, QUANTITATIVE: D-Dimer, Quant: 0.66 ug/mL-FEU — ABNORMAL HIGH (ref 0.00–0.50)

## 2021-06-26 LAB — BRAIN NATRIURETIC PEPTIDE: B Natriuretic Peptide: 97.6 pg/mL (ref 0.0–100.0)

## 2021-06-26 MED ORDER — SODIUM CHLORIDE 0.9 % IV SOLN
2.0000 g | INTRAVENOUS | Status: DC
  Administered 2021-06-27: 2 g via INTRAVENOUS
  Filled 2021-06-26: qty 2

## 2021-06-26 MED ORDER — ACETAMINOPHEN 325 MG PO TABS
650.0000 mg | ORAL_TABLET | Freq: Four times a day (QID) | ORAL | Status: DC | PRN
Start: 2021-06-26 — End: 2021-06-27
  Administered 2021-06-26: 650 mg via ORAL
  Filled 2021-06-26: qty 2

## 2021-06-26 MED ORDER — SODIUM CHLORIDE 0.9 % IV SOLN
2.0000 g | Freq: Once | INTRAVENOUS | Status: AC
  Administered 2021-06-26: 2 g via INTRAVENOUS
  Filled 2021-06-26: qty 20

## 2021-06-26 MED ORDER — ONDANSETRON HCL 4 MG/2ML IJ SOLN
4.0000 mg | Freq: Once | INTRAMUSCULAR | Status: AC
  Administered 2021-06-26: 4 mg via INTRAVENOUS
  Filled 2021-06-26: qty 2

## 2021-06-26 MED ORDER — ADULT MULTIVITAMIN W/MINERALS CH
1.0000 | ORAL_TABLET | Freq: Every day | ORAL | Status: DC
  Administered 2021-06-27: 1 via ORAL
  Filled 2021-06-26: qty 1

## 2021-06-26 MED ORDER — ASPIRIN EC 81 MG PO TBEC
81.0000 mg | DELAYED_RELEASE_TABLET | Freq: Every day | ORAL | Status: DC
  Administered 2021-06-26 – 2021-06-27 (×2): 81 mg via ORAL
  Filled 2021-06-26 (×2): qty 1

## 2021-06-26 MED ORDER — ALUM & MAG HYDROXIDE-SIMETH 200-200-20 MG/5ML PO SUSP: 30.0000 mL | Freq: Four times a day (QID) | ORAL | Status: DC | PRN

## 2021-06-26 MED ORDER — NITROGLYCERIN 0.4 MG SL SUBL
0.4000 mg | SUBLINGUAL_TABLET | SUBLINGUAL | Status: DC | PRN
Start: 2021-06-26 — End: 2021-06-27

## 2021-06-26 MED ORDER — ENOXAPARIN SODIUM 40 MG/0.4ML IJ SOSY
40.0000 mg | PREFILLED_SYRINGE | INTRAMUSCULAR | Status: DC
  Administered 2021-06-26: 40 mg via SUBCUTANEOUS
  Filled 2021-06-26: qty 0.4

## 2021-06-26 MED ORDER — MORPHINE SULFATE (PF) 4 MG/ML IV SOLN
4.0000 mg | Freq: Once | INTRAVENOUS | Status: AC
  Administered 2021-06-26: 4 mg via INTRAVENOUS
  Filled 2021-06-26: qty 1

## 2021-06-26 MED ORDER — SODIUM CHLORIDE 0.9 % IV SOLN
500.0000 mg | INTRAVENOUS | Status: DC
  Administered 2021-06-26: 500 mg via INTRAVENOUS
  Filled 2021-06-26: qty 500
  Filled 2021-06-26: qty 5

## 2021-06-26 MED ORDER — SPIRONOLACTONE 25 MG PO TABS
25.0000 mg | ORAL_TABLET | Freq: Every day | ORAL | Status: DC
Start: 2021-06-27 — End: 2021-06-27
  Administered 2021-06-27: 25 mg via ORAL
  Filled 2021-06-26: qty 1

## 2021-06-26 MED ORDER — DM-GUAIFENESIN ER 30-600 MG PO TB12: 1.0000 | ORAL_TABLET | Freq: Two times a day (BID) | ORAL | Status: DC | PRN

## 2021-06-26 MED ORDER — HYDRALAZINE HCL 20 MG/ML IJ SOLN
5.0000 mg | INTRAMUSCULAR | Status: DC | PRN
  Administered 2021-06-27: 5 mg via INTRAVENOUS
  Filled 2021-06-26: qty 1

## 2021-06-26 MED ORDER — OXYCODONE-ACETAMINOPHEN 5-325 MG PO TABS
1.0000 | ORAL_TABLET | ORAL | Status: DC | PRN
  Administered 2021-06-26 – 2021-06-27 (×3): 1 via ORAL
  Filled 2021-06-26 (×3): qty 1

## 2021-06-26 MED ORDER — SACCHAROMYCES BOULARDII 250 MG PO CAPS
250.0000 mg | ORAL_CAPSULE | Freq: Two times a day (BID) | ORAL | Status: DC
  Administered 2021-06-26 – 2021-06-27 (×2): 250 mg via ORAL
  Filled 2021-06-26 (×2): qty 1

## 2021-06-26 MED ORDER — MORPHINE SULFATE (PF) 2 MG/ML IV SOLN
2.0000 mg | INTRAVENOUS | Status: DC | PRN
  Administered 2021-06-26 – 2021-06-27 (×3): 2 mg via INTRAVENOUS
  Filled 2021-06-26 (×3): qty 1

## 2021-06-26 MED ORDER — SODIUM CHLORIDE 0.9 % IV BOLUS
500.0000 mL | Freq: Once | INTRAVENOUS | Status: AC
  Administered 2021-06-26: 500 mL via INTRAVENOUS

## 2021-06-26 MED ORDER — ONDANSETRON HCL 4 MG/2ML IJ SOLN: 4.0000 mg | Freq: Three times a day (TID) | INTRAMUSCULAR | Status: DC | PRN

## 2021-06-26 MED ORDER — VITAMIN B-12 1000 MCG PO TABS
1000.0000 ug | ORAL_TABLET | Freq: Every day | ORAL | Status: DC
  Administered 2021-06-27: 1000 ug via ORAL
  Filled 2021-06-26: qty 1

## 2021-06-26 MED ORDER — BACLOFEN 10 MG PO TABS
10.0000 mg | ORAL_TABLET | Freq: Two times a day (BID) | ORAL | Status: DC
  Administered 2021-06-26 – 2021-06-27 (×2): 10 mg via ORAL
  Filled 2021-06-26 (×2): qty 1

## 2021-06-26 MED ORDER — ALBUTEROL SULFATE (2.5 MG/3ML) 0.083% IN NEBU: 2.5000 mg | INHALATION_SOLUTION | RESPIRATORY_TRACT | Status: DC | PRN

## 2021-06-26 MED ORDER — FUROSEMIDE 20 MG PO TABS
20.0000 mg | ORAL_TABLET | Freq: Every day | ORAL | Status: DC
  Administered 2021-06-26 – 2021-06-27 (×2): 20 mg via ORAL
  Filled 2021-06-26 (×2): qty 1

## 2021-06-26 MED ORDER — PANTOPRAZOLE SODIUM 40 MG PO TBEC
40.0000 mg | DELAYED_RELEASE_TABLET | Freq: Every day | ORAL | Status: DC
  Administered 2021-06-26: 40 mg via ORAL
  Filled 2021-06-26: qty 1

## 2021-06-26 MED ORDER — LOSARTAN POTASSIUM 50 MG PO TABS
100.0000 mg | ORAL_TABLET | Freq: Every day | ORAL | Status: DC
  Administered 2021-06-26 – 2021-06-27 (×2): 100 mg via ORAL
  Filled 2021-06-26 (×2): qty 2

## 2021-06-26 MED ORDER — IOHEXOL 350 MG/ML SOLN
75.0000 mL | Freq: Once | INTRAVENOUS | Status: AC | PRN
  Administered 2021-06-26: 75 mL via INTRAVENOUS

## 2021-06-26 NOTE — Assessment & Plan Note (Signed)
-   IV hydralazine as needed ?-Cozaar ?-Patient is also on Lasix and spironolactone ?

## 2021-06-26 NOTE — H&P (Addendum)
?History and Physical  ? ? ?John Booker:[id number] DOB: 27-Oct-1950 DOA: 06/26/2021 ? ?Referring MD/NP/PA:  ? ?PCP: Pcp, No  ? ?Patient coming from:  The patient is coming from home.  At baseline, pt is independent for most of ADL.       ? ?Chief Complaint: chest pain, headache ? ?HPI: John Booker is a 71 y.o. male with medical history significant of hypertension, alcohol abuse in remission, esophageal dysmotility, dCHF, who presents with chest pain and headache. ? ?Pt states that he has headache for 3 days, which is located in the back of his head, severe, aching, nonradiating.  No unilateral numbness or tinglings in extremities, no facial droop or slurred speech.  Patient also reports chest pain today. The chest pain is located in front lower chest, pointing to epigastric area, burning-like, with indigestion feeling, nonradiating.  Associate with mild shortness breath and dry cough.  Denies fever or chills.  Patient has nausea, no vomiting, diarrhea or abdominal pain.  No symptoms of UTI.  Patient has chronic bilateral leg edema.  Patient was given referral to [facility].  ? ?Data Reviewed and ED Course: pt was found to have WBC 10.4, BMP 218, troponin level 8 -->7, GFR> 60, positive D-dimer 0.66, negative COVID PCR, BNP 218. Temperature normal, blood pressure 164/81, heart rate 102, RR 19, oxygen saturation 93-98% on room air.  Chest x-ray showed new airspace disease in the right middle lobe and right lower lobe.  CT of head is negative.  CT angiogram is negative for PE, but showed multifocal infiltration. Patient is placed on telemetry bed for observation. ? ?CTA: ?There are patchy alveolar infiltrates in the right middle lobe and ?both lower lobes suggesting multifocal pneumonia. There is no ?pleural effusion. ?  ?There is no evidence of central pulmonary artery embolism. There is ?ectasia of main pulmonary artery suggesting possible pulmonary ?arterial hypertension. ?  ?There is no evidence of thoracic aortic  dissection. There is ectasia ?of ascending thoracic aorta. ?  ?There is minimal stranding in the peripancreatic fat which may ?suggest scarring from previous inflammation or early acute ?pancreatitis. Please correlate with laboratory findings. ?  ?Small hiatal hernia. There is mild diffuse wall thickening in the ?thoracic esophagus suggesting possible reflux esophagitis. ?  ?Other findings as described in the body of the report. ? ? ?EKG: I have personally reviewed.  Sinus rhythm, QTc 441, LAE, right axis deviation, poor R wave progression ? ? ?Review of Systems:  ? ?General: no fevers, chills, no body weight gain, has fatigue ?HEENT: no blurry vision, hearing changes or sore throat. Has HA. ?Respiratory: has dyspnea, coughing, no wheezing ?CV: has chest pain, no palpitations ?GI: has nausea, no vomiting, abdominal pain, diarrhea, constipation ?GU: no dysuria, burning on urination, increased urinary frequency, hematuria  ?Ext:  has leg edema ?Neuro: no unilateral weakness, numbness, or tingling, no vision change or hearing loss ?Skin: no rash, no skin tear. ?MSK: No muscle spasm, no deformity, no limitation of range of movement in spin ?Heme: No easy bruising.  ?Travel history: No recent long distant travel. ? ? ?Allergy:  ?Allergies  ?Allergen Reactions  ? Adalat [Nifedipine]   ?  Foot swelling.   ? Amlodipine Other (See Comments)  ?  Leg swelling  ? ? ?Past Medical History:  ?Diagnosis Date  ? Hypertension   ? ? ?Past Surgical History:  ?Procedure Laterality Date  ? ESOPHAGOGASTRODUODENOSCOPY (EGD) WITH PROPOFOL N/A 05/26/2018  ? Procedure: ESOPHAGOGASTRODUODENOSCOPY (EGD) WITH PROPOFOL;  Surgeon: Bonna Gains,  Lennette Bihari, MD;  Location: [facility] ENDOSCOPY;  Service: Endoscopy;  Laterality: N/A;  ? ESOPHAGOGASTRODUODENOSCOPY (EGD) WITH PROPOFOL N/A 12/28/2020  ? Procedure: ESOPHAGOGASTRODUODENOSCOPY (EGD) WITH PROPOFOL;  Surgeon: Annamaria Helling, DO;  Location: [facility] ENDOSCOPY;  Service: Gastroenterology;  Laterality:  N/A;  ? INCISION AND DRAINAGE PERIRECTAL ABSCESS N/A 11/19/2015  ? Procedure: IRRIGATION AND DEBRIDEMENT PERIRECTAL ABSCESS and debridement of scrotal abscess;  Surgeon: Florene Glen, MD;  Location: [facility] ORS;  Service: General;  Laterality: N/A;  ? INCISION AND DRAINAGE PERIRECTAL ABSCESS N/A 11/20/2015  ? Procedure: IRRIGATION AND DEBRIDEMENT PERIRECTAL ABSCESS / WITH DRESSING CHANGE;  Surgeon: Florene Glen, MD;  Location: [facility] ORS;  Service: General;  Laterality: N/A;  peri rectal site  ? INCISION AND DRAINAGE PERIRECTAL ABSCESS N/A 11/21/2015  ? Procedure: IRRIGATION AND DEBRIDEMENT PERIRECTAL ABSCESS WITH DRESSING CHANGE;  Surgeon: Florene Glen, MD;  Location: [facility] ORS;  Service: General;  Laterality: N/A;  ? [place]  ? ? ?Social History:  reports that he has quit smoking. His smoking use included cigarettes. He has a 19.00 pack-year smoking history. He has quit using smokeless tobacco. He reports that he does not currently use alcohol. He reports that he does not use drugs. ? ?Family History:  ?Family History  ?Problem Relation Age of Onset  ? Hypertension Father   ?  ? ?Prior to Admission medications   ?Medication Sig Start Date End Date Taking? Authorizing Provider  ?baclofen (LIORESAL) 10 MG tablet Take 1 tablet (10 mg total) by mouth 2 (two) times daily. 05/13/21   Karamalegos, Devonne Doughty, DO  ?feeding supplement (ENSURE ENLIVE / ENSURE PLUS) LIQD Take 237 mLs by mouth 3 (three) times daily between meals. 12/31/20   Ezekiel Slocumb, DO  ?furosemide (LASIX) 20 MG tablet Take 1 tablet (20 mg total) by mouth daily as needed for edema or fluid. ?Patient not taking: Reported on 06/20/2021 03/05/21   Olin Hauser, DO  ?losartan (COZAAR) 100 MG tablet Take 1 tablet (100 mg total) by mouth daily. 05/13/21   Olin Hauser, DO  ?Multiple Vitamin (MULTIVITAMIN WITH MINERALS) TABS tablet Take 1 tablet by mouth daily. 12/31/20   Ezekiel Slocumb, DO  ?saccharomyces  boulardii (FLORASTOR) 250 MG capsule Take 1 capsule (250 mg total) by mouth 2 (two) times daily. 12/31/20   Ezekiel Slocumb, DO  ?sildenafil (REVATIO) 20 MG tablet Take 1-5 pills about 30 min prior to sex. Start with 1 and increase as needed. ?Patient not taking: Reported on 06/20/2021 12/20/19   Olin Hauser, DO  ?spironolactone (ALDACTONE) 25 MG tablet Take 1 tablet (25 mg total) by mouth daily. 06/21/21   Karamalegos, Devonne Doughty, DO  ?vitamin B-12 1000 MCG tablet Take 1 tablet (1,000 mcg total) by mouth daily. 12/31/20   Ezekiel Slocumb, DO  ?Vitamin D, Ergocalciferol, (DRISDOL) 1.25 MG (50000 UNIT) CAPS capsule Take 1 capsule (50,000 Units total) by mouth every 7 (seven) days. 01/03/21   Ezekiel Slocumb, DO  ? ? ?Physical Exam: ?Vitals:  ? 06/26/21 2325 06/27/21 0344 06/27/21 0354 06/27/21 0437  ?BP: (!) 149/75 (!) 203/97  (!) 170/88  ?Pulse: 73 87  81  ?Resp: '16 18  17  '$ ?Temp: 98.3 ?F (36.8 ?C) 98 ?F (36.7 ?C)  98.2 ?F (36.8 ?C)  ?TempSrc:      ?SpO2: 100% 100%  98%  ?Weight:   106.2 kg   ?Height:      ? ?General: Not in acute distress ?HEENT: ?  Eyes: PERRL, EOMI, no scleral icterus. ?      ENT: No discharge from the ears and nose, no pharynx injection, no tonsillar enlargement.  ?      Neck: No JVD, no bruit, no mass felt. ?Heme: No neck lymph node enlargement. ?Cardiac: S1/S2, RRR, No murmurs, No gallops or rubs. ?Respiratory: No rales, wheezing, rhonchi or rubs. ?GI: Soft, nondistended, nontender, no rebound pain, no organomegaly, BS present. ?GU: No hematuria ?Ext: has 2+ leg edema bilaterally. 1+DP/PT pulse bilaterally. ?Musculoskeletal: No joint deformities, No joint redness or warmth, no limitation of ROM in spin. ?Skin: No rashes.  ?Neuro: Alert, oriented X3, cranial nerves II-XII grossly intact, moves all extremities normally. ?Psych: Patient is not psychotic, no suicidal or hemocidal ideation. ? ?Labs on Admission: I have personally reviewed following labs and imaging  studies ? ?CBC: ?Recent Labs  ?Lab 06/26/21 ?1118  ?WBC 10.4  ?HGB 16.7  ?HCT 51.5  ?MCV 94.5  ?PLT 177  ? ?Basic Metabolic Panel: ?Recent Labs  ?Lab 06/26/21 ?1118 06/27/21 ?7371  ?NA 136 134*  ?K 3.7 3.8  ?CL 10

## 2021-06-26 NOTE — ED Provider Triage Note (Signed)
Emergency Medicine Provider Triage Evaluation Note ? ?John Booker , a 71 y.o. male  was evaluated in triage.  Pt complains of chest pain, headache, elevated blood pressure, swelling in lower extremities. ? ?Review of Systems  ?Positive: Chest pain ?Negative: Fever, chills ? ?Physical Exam  ?BP (!) 168/98 (BP Location: Right Arm)   Pulse (!) 102   Temp 98.5 ?F (36.9 ?C) (Oral)   Resp 18   Ht '6\' 3"'$  (1.905 m)   Wt 105.6 kg   SpO2 93%   BMI 29.10 kg/m?  ?Gen:   Awake, no distress   ?Resp:  Normal effort  ?MSK:   Moves extremities without difficulty, 1+ pitting edema noted bilaterally ?Other:   ? ?Medical Decision Making  ?Medically screening exam initiated at 11:23 AM.  Appropriate orders placed.  John Booker was informed that the remainder of the evaluation will be completed by another provider, this initial triage assessment does not replace that evaluation, and the importance of remaining in the ED until their evaluation is complete. ? ?Due to the headache and elevated blood pressure with no history of migraines ordered CT of the head.  This pain protocol started.  Patient also has some indigestion so added hepatic panel and lipase. ?  ?Versie Starks, PA-C ?06/26/21 1124 ? ?

## 2021-06-26 NOTE — Assessment & Plan Note (Addendum)
2D echo on 04/11/2021 showed EF 60 to 65% with grade 1 diastolic dysfunction.  Patient has 2+ leg edema, BNP slightly elevated 218, but no pulm edema chest x-ray.  Does not seem to have CHF exacerbation.  Patient is on as needed Lasix at home ?-Schedule Lasix 20 mg daily ?-Continue home spironolactone ?-f/u lower extremity Doppler to rule out DVT ?

## 2021-06-26 NOTE — ED Provider Notes (Signed)
? ?[facility] ?Provider Note ? ? ? Event Date/Time  ? First MD Initiated Contact with Patient 06/26/21 1307   ?  (approximate) ? ? ?History  ? ?Chest Pain, Leg Swelling, and Headache ? ? ?HPI ? ?John Booker is a 71 y.o. male with history of hypertension, swelling in lower extremities, presents emergency department complaining of chest pain, headache, and not feeling well.  Patient states that the chest pain is central, been ongoing for 2 days.  States some chest pain feels like his GERD, comes and goes.  Leg swelling has been there for months and is being worked up by vascular.  They are supposed to have ultrasounds of the lower extremities.  He had an echo done in March which did not show CHF.  He denies fever, chills ? ?  ? ? ?Physical Exam  ? ?Triage Vital Signs: ?ED Triage Vitals  ?Enc Vitals Group  ?   BP 06/26/21 1115 (!) 168/98  ?   Pulse Rate 06/26/21 1115 (!) 102  ?   Resp 06/26/21 1115 18  ?   Temp 06/26/21 1115 98.5 ?F (36.9 ?C)  ?   Temp Source 06/26/21 1115 Oral  ?   SpO2 06/26/21 1115 93 %  ?   Weight 06/26/21 1116 232 lb 12.8 oz (105.6 kg)  ?   Height 06/26/21 1116 '6\' 3"'$  (1.905 m)  ?   Head Circumference --   ?   Peak Flow --   ?   Pain Score 06/26/21 1116 7  ?   Pain Loc --   ?   Pain Edu? --   ?   Excl. in [place]? --   ? ? ?Most recent vital signs: ?Vitals:  ? 06/26/21 1115 06/26/21 1303  ?BP: (!) 168/98 (!) 164/81  ?Pulse: (!) 102 93  ?Resp: 18 19  ?Temp: 98.5 ?F (36.9 ?C)   ?SpO2: 93% 98%  ? ? ? ?General: Awake, no distress.   ?CV:  Good peripheral perfusion.  Tachycardic and regular rhythm ?Resp:  Normal effort. Lungs CTA ?Abd:  No distention.   ?Other:    ? ? ?ED Results / Procedures / Treatments  ? ?Labs ?(all labs ordered are listed, but only abnormal results are displayed) ?Labs Reviewed  ?BASIC METABOLIC PANEL - Abnormal; Notable for the following components:  ?    Result Value  ? Glucose, Bld 133 (*)   ? All other components within normal limits  ?HEPATIC FUNCTION  PANEL - Abnormal; Notable for the following components:  ? Total Bilirubin 1.5 (*)   ? Bilirubin, Direct 0.3 (*)   ? Indirect Bilirubin 1.2 (*)   ? All other components within normal limits  ?CULTURE, BLOOD (ROUTINE X 2)  ?CULTURE, BLOOD (ROUTINE X 2)  ?EXPECTORATED SPUTUM ASSESSMENT W GRAM STAIN, RFLX TO RESP C  ?RESP PANEL BY RT-PCR (FLU A&B, COVID) ARPGX2  ?CBC  ?LIPASE, BLOOD  ?BRAIN NATRIURETIC PEPTIDE  ?HEMOGLOBIN A1C  ?URINE DRUG SCREEN, QUALITATIVE ([facility] ONLY)  ?D-DIMER, QUANTITATIVE  ?PROCALCITONIN  ?STREP PNEUMONIAE URINARY ANTIGEN  ?LEGIONELLA PNEUMOPHILA SEROGP 1 UR AG  ?TROPONIN I (HIGH SENSITIVITY)  ?TROPONIN I (HIGH SENSITIVITY)  ? ? ? ?EKG ? ?EKG ? ? ?RADIOLOGY ?Chest x-ray ? ? ? ?PROCEDURES: ? ? ?Procedures ? ? ?MEDICATIONS ORDERED IN ED: ?Medications  ?morphine (PF) 4 MG/ML injection 4 mg (4 mg Intravenous Incomplete 06/26/21 1420)  ?ondansetron (ZOFRAN) injection 4 mg (has no administration in time range)  ?cefTRIAXone (ROCEPHIN) 2 g in sodium  chloride 0.9 % 100 mL IVPB (has no administration in time range)  ?sodium chloride 0.9 % bolus 500 mL (has no administration in time range)  ?ondansetron (ZOFRAN) injection 4 mg (has no administration in time range)  ?acetaminophen (TYLENOL) tablet 650 mg (has no administration in time range)  ?hydrALAZINE (APRESOLINE) injection 5 mg (has no administration in time range)  ?aspirin EC tablet 81 mg (has no administration in time range)  ?nitroGLYCERIN (NITROSTAT) SL tablet 0.4 mg (has no administration in time range)  ?morphine (PF) 2 MG/ML injection 2 mg (has no administration in time range)  ?enoxaparin (LOVENOX) injection 40 mg (has no administration in time range)  ?pantoprazole (PROTONIX) EC tablet 40 mg (has no administration in time range)  ?albuterol (PROVENTIL) (2.5 MG/3ML) 0.083% nebulizer solution 2.5 mg (has no administration in time range)  ?dextromethorphan-guaiFENesin (MUCINEX DM) 30-600 MG per 12 hr tablet 1 tablet (has no administration in time  range)  ? ? ? ?IMPRESSION / MDM / ASSESSMENT AND PLAN / ED COURSE  ?I reviewed the triage vital signs and the nursing notes. ?             ?               ? ?Differential diagnosis includes, but is not limited to, MI, CHF, CAP, CVA, migraine ? ?Patient's labs are reassuring at this time, CBC, metabolic panel, first troponin along with lipase is normal.  His hepatic function panel has total bilirubin of 1.5, direct bilirubin 0.3 and indirect is 1.2 which are all elevated.  Patient does have history of EtOH abuse which may be the cause of his elevated liver enzymes. ? ?Chest x-ray was independently reviewed by me and confirmed by radiology to have CAP. ? ?CT of the head independently reviewed by me and confirmed by radiology is negative for any acute abnormality. ? ?EKG shows sinus tachycardia with first-degree AV block.  See physician read. ? ?Discussed patient with Dr. Joni Fears.  Recommends that we admit the patient.  Discussed this with the family and the patient.  He is agreeable to admission. ? ?Due to the pneumonia Rocephin 2 g IV was ordered.  Patient still complained of a headache so he was given morphine 4 mg IV along with Zofran 4 mg IV.   ? ?Consult hospitalist. ?Hospitalist to admit. ? ? ? ?  ? ? ?FINAL CLINICAL IMPRESSION(S) / ED DIAGNOSES  ? ?Final diagnoses:  ?Other chest pain  ?Community acquired pneumonia of right middle lobe of lung  ?Bad headache  ? ? ? ?Rx / DC Orders  ? ?ED Discharge Orders   ? ? None  ? ?  ? ? ? ?Note:  This document was prepared using Dragon voice recognition software and may include unintentional dictation errors. ? ?  ?Versie Starks, PA-C ?06/26/21 1425 ? ?  ?Carrie Mew, MD ?06/26/21 1523 ? ?

## 2021-06-26 NOTE — ED Triage Notes (Signed)
Pt comes into the ED via POV c/o central chest pain, leg swelling, headaches, and HTN problems.  Pt states this has been ongoing for a couple days now.  Pt described his chest pain as GERD that is intermittent.  PT has had new medication changes.  Pt states the leg swelling has been there for months.  Pt currently has even and unlabored respirations.  Echo done in [facility] which didn't show any CHF.  ?

## 2021-06-26 NOTE — Assessment & Plan Note (Addendum)
Patient has not had fever or leukocytosis, but the chest x-ray showed infiltration in right middle lobe and right lower lobe.  CT angiograms negative for PE, but showed multifocal infiltration, clinically patient likely has multifocal pneumonia.  Patient does not meet criteria for sepsis. Another potential DD is CHF. Will get procalcitonin level. ? ?- Placed on MedSurg bed for observation ?- IV Rocephin and azithromycin ?- Mucinex for cough  ?- Bronchodilators ?- Urine legionella and S. pneumococcal antigen ?- Follow up blood culture x2, sputum culture  ? -Follow-up procalcitonin level ? ?

## 2021-06-26 NOTE — Assessment & Plan Note (Signed)
Etiology is not clear.  CT head negative.  No focal neurologic deficit on physical examination ?-As needed Tylenol ?

## 2021-06-26 NOTE — Assessment & Plan Note (Signed)
Likely due to multifocal pneumonia.  Troponin negative x2 ?-Check A1c, FLP ?-Aspirin 81 mg daily empirically ?-As needed morphine, nitroglycerin ?

## 2021-06-26 NOTE — Assessment & Plan Note (Signed)
-   In remission for more than 1 year ?

## 2021-06-27 DIAGNOSIS — J189 Pneumonia, unspecified organism: Secondary | ICD-10-CM | POA: Diagnosis not present

## 2021-06-27 LAB — BASIC METABOLIC PANEL
Anion gap: 4 — ABNORMAL LOW (ref 5–15)
BUN: 22 mg/dL (ref 8–23)
CO2: 29 mmol/L (ref 22–32)
Calcium: 8.5 mg/dL — ABNORMAL LOW (ref 8.9–10.3)
Chloride: 101 mmol/L (ref 98–111)
Creatinine, Ser: 1.01 mg/dL (ref 0.61–1.24)
GFR, Estimated: >60 mL/min (ref >60)
Glucose, Bld: 121 mg/dL — ABNORMAL HIGH (ref 70–99)
Potassium: 3.8 mmol/L (ref 3.5–5.1)
Sodium: 134 mmol/L — ABNORMAL LOW (ref 135–145)

## 2021-06-27 LAB — LIPID PANEL
Cholesterol: 101 mg/dL (ref 0–200)
HDL: 51 mg/dL (ref >40)
LDL Cholesterol: 44 mg/dL (ref 0–99)
Total CHOL/HDL Ratio: 2 RATIO
Triglycerides: 28 mg/dL (ref <150)
VLDL: 6 mg/dL (ref 0–40)

## 2021-06-27 LAB — STREP PNEUMONIAE URINARY ANTIGEN: Strep Pneumo Urinary Antigen: NEGATIVE

## 2021-06-27 MED ORDER — AZITHROMYCIN 250 MG PO TABS: ORAL_TABLET | ORAL | 0 refills | Status: AC

## 2021-06-27 MED ORDER — ASPIRIN 81 MG PO TBEC: 81.0000 mg | DELAYED_RELEASE_TABLET | Freq: Every day | ORAL | 11 refills | Status: DC

## 2021-06-27 MED ORDER — ACETAMINOPHEN 325 MG PO TABS: 650.0000 mg | ORAL_TABLET | Freq: Four times a day (QID) | ORAL | 0 refills | Status: DC | PRN

## 2021-06-27 MED ORDER — AMOXICILLIN-POT CLAVULANATE 875-125 MG PO TABS: 1.0000 | ORAL_TABLET | Freq: Two times a day (BID) | ORAL | 0 refills | Status: AC

## 2021-06-27 MED ORDER — ALUM & MAG HYDROXIDE-SIMETH 200-200-20 MG/5ML PO SUSP: 30.0000 mL | Freq: Four times a day (QID) | ORAL | 0 refills | Status: DC | PRN

## 2021-06-27 MED ORDER — DM-GUAIFENESIN ER 30-600 MG PO TB12: 1.0000 | ORAL_TABLET | Freq: Two times a day (BID) | ORAL | 0 refills | Status: DC | PRN

## 2021-06-27 MED ORDER — PANTOPRAZOLE SODIUM 40 MG PO TBEC: 40.0000 mg | DELAYED_RELEASE_TABLET | Freq: Every day | ORAL | 1 refills | Status: DC

## 2021-06-27 MED ORDER — FUROSEMIDE 20 MG PO TABS: 40.0000 mg | ORAL_TABLET | Freq: Every day | ORAL | 1 refills | Status: DC

## 2021-06-27 MED ORDER — OXYCODONE-ACETAMINOPHEN 5-325 MG PO TABS: 1.0000 | ORAL_TABLET | ORAL | 0 refills | Status: DC | PRN

## 2021-06-27 NOTE — Progress Notes (Signed)
[name] to be D/C'd Home per MD order.  Discussed prescriptions and follow up appointments with the patient. Prescriptions given to patient, medication list explained in detail. Pt verbalized understanding. ? ?Allergies as of 06/27/2021   ? ?   Reactions  ? Adalat [nifedipine]   ? Foot swelling.   ? Amlodipine Other (See Comments)  ? Leg swelling  ? ?  ? ?  ?Medication List  ?  ? ?STOP taking these medications   ? ?oxymetazoline 0.05 % nasal spray ?Commonly known as: AFRIN ?  ? ?  ? ?TAKE these medications   ? ?acetaminophen 325 MG tablet ?Commonly known as: TYLENOL ?Take 2 tablets (650 mg total) by mouth every 6 (six) hours as needed for mild pain or fever. ?  ?alum & mag hydroxide-simeth 200-200-20 MG/5ML suspension ?Commonly known as: MAALOX/MYLANTA ?Take 30 mLs by mouth every 6 (six) hours as needed for indigestion or heartburn. ?  ?amoxicillin-clavulanate 875-125 MG tablet ?Commonly known as: AUGMENTIN ?Take 1 tablet by mouth 2 (two) times daily for 5 days. ?  ?aspirin 81 MG EC tablet ?Take 1 tablet (81 mg total) by mouth daily. Swallow whole. ?  ?azithromycin 250 MG tablet ?Commonly known as: Zithromax Z-Pak ?Take 2 tablets (500 mg) on  Day 1,  followed by 1 tablet (250 mg) once daily on Days 2 through 5. ?  ?baclofen 10 MG tablet ?Commonly known as: LIORESAL ?Take 1 tablet (10 mg total) by mouth 2 (two) times daily. ?  ?cyanocobalamin 1000 MCG tablet ?Take 1 tablet (1,000 mcg total) by mouth daily. ?  ?dextromethorphan-guaiFENesin 30-600 MG 12hr tablet ?Commonly known as: [place] DM ?Take 1 tablet by mouth 2 (two) times daily as needed for cough. ?  ?feeding supplement Liqd ?Take 237 mLs by mouth 3 (three) times daily between meals. ?  ?furosemide 20 MG tablet ?Commonly known as: LASIX ?Take 2 tablets (40 mg total) by mouth daily. ?What changed:  ?how much to take ?when to take this ?reasons to take this ?  ?losartan 100 MG tablet ?Commonly known as: COZAAR ?Take 1 tablet (100 mg total) by mouth  daily. ?  ?multivitamin with minerals Tabs tablet ?Take 1 tablet by mouth daily. ?  ?oxyCODONE-acetaminophen 5-325 MG tablet ?Commonly known as: PERCOCET/ROXICET ?Take 1 tablet by mouth every 4 (four) hours as needed for moderate pain. ?  ?pantoprazole 40 MG tablet ?Commonly known as: PROTONIX ?Take 1 tablet (40 mg total) by mouth daily at 12 noon. ?  ?saccharomyces boulardii 250 MG capsule ?Commonly known as: FLORASTOR ?Take 1 capsule (250 mg total) by mouth 2 (two) times daily. ?  ?sildenafil 20 MG tablet ?Commonly known as: REVATIO ?Take 1-5 pills about 30 min prior to sex. Start with 1 and increase as needed. ?  ?spironolactone 25 MG tablet ?Commonly known as: ALDACTONE ?Take 1 tablet (25 mg total) by mouth daily. ?  ?Vitamin D (Ergocalciferol) 1.25 MG (50000 UNIT) Caps capsule ?Commonly known as: DRISDOL ?Take 1 capsule (50,000 Units total) by mouth every 7 (seven) days. ?  ? ?  ? ? ?Vitals:  ? 06/27/21 0959 06/27/21 1144  ?BP: (!) 144/75 (!) 150/73  ?Pulse: 87 75  ?Resp:    ?Temp:  97.7 ?F (36.5 ?C)  ?SpO2:  98%  ? ?Tele box removed and returned. IV catheter discontinued intact. Site without signs and symptoms of complications. Dressing and pressure applied. Pt denies pain at this time. No complaints noted. ? ?An After Visit Summary was printed and given to  the patient. ?Patient escorted via [facility], and D/C home via private auto. ? ?Rolley Sims  ?

## 2021-06-27 NOTE — Discharge Summary (Signed)
?Physician Discharge Summary ?  ?Patient: John Booker MRN: [id number] DOB: 10-18-1950  ?Admit date:     06/26/2021  ?Discharge date: 06/27/21  ?Discharge Physician: Lorella Nimrod  ? ?PCP: Pcp, No  ? ?Recommendations at discharge:  ?Please repeat chest x-ray in 4 weeks to see the resolution of pneumonia. ?Follow-up with gastroenterology to discuss about esophageal dysmotility and management. ?Follow-up with primary care provider within a week. ? ?Discharge Diagnoses: ?Principal Problem: ?  Multifocal pneumonia ?Active Problems: ?  Chest pain ?  Essential hypertension ?  Alcohol abuse ?  Chronic diastolic CHF (congestive heart failure) ([place]) ?  Headache ? ?Hospital Course: ?John Booker is a 71 y.o. male with medical history significant of hypertension, alcohol abuse in remission, esophageal dysmotility, dCHF, who presents with chest pain and headache. ?  ?Pt states that he has headache for 3 days, which is located in the back of his head, severe, aching, nonradiating.  No unilateral numbness or tinglings in extremities, no facial droop or slurred speech.  Patient also reports chest pain today. The chest pain is located in front lower chest, pointing to epigastric area, burning-like, with indigestion feeling, nonradiating.  Associate with mild shortness breath and dry cough.  Denies fever or chills.  Patient has nausea, no vomiting, diarrhea or abdominal pain.  No symptoms of UTI.  Patient has chronic bilateral leg edema.  He used to take Lasix before which he has stopped taking it. ? ?pt was found to have WBC 10.4, BMP 218, troponin level 8 -->7, GFR> 60, positive D-dimer 0.66, negative COVID PCR, BNP 218. Temperature normal, blood pressure 164/81, heart rate 102, RR 19, oxygen saturation 93-98% on room air.  Chest x-ray showed new airspace disease in the right middle lobe and right lower lobe.  CT of head is negative.  CT angiogram is negative for PE, but showed multifocal infiltration. ?Procalcitonin was elevated  at 3.19.  He was started on ceftriaxone and Zithromax. ?Patient denies any choking spells or coughing with food. ?Patient remained afebrile and stable, is being discharged on Augmentin and Zithromax and need to follow-up with primary care provider for further recommendations.  He will need a repeat chest x-ray in 4 to 6 weeks to see the resolution of his pneumonia. ? ?Patient do have chronic lower extremity edema.  He stopped taking his Lasix which was initially as needed, we made it 40 mg daily and his PCP should be able to follow-up. ?Lower extremity venous Doppler was negative for any DVT.  No concern of cellulitis. ?He will also continue with his home dose of spironolactone. ? ?Chest pain is most likely secondary to pneumonia.  Troponin remains negative. ?He was started on baby aspirin empirically. ? ?Patient continues to have some headache, improved with Percocet.  CT head was negative.  No focal neurologic deficit.  He was given few tablets of Percocet which he can use as needed and follow-up with primary care provider. ? ?Blood pressure was elevated.  Apparently stopped taking his losartan which he need to resume and have a close follow-up with primary care provider for further recommendations. ? ?Patient will continue his current medications except with the changes as mentioned above and follow-up with his providers. ? ?Assessment and Plan: ?* Multifocal pneumonia ?Patient has not had fever or leukocytosis, but the chest x-ray showed infiltration in right middle lobe and right lower lobe.  CT angiograms negative for PE, but showed multifocal infiltration, clinically patient likely has multifocal pneumonia.  Patient does not  meet criteria for sepsis. Another potential DD is CHF. Will get procalcitonin level. ? ?- Placed on MedSurg bed for observation ?- IV Rocephin and azithromycin ?- Mucinex for cough  ?- Bronchodilators ?- Urine legionella and S. pneumococcal antigen ?- Follow up blood culture x2, sputum  culture  ? -Follow-up procalcitonin level ? ? ?Chest pain ?Likely due to multifocal pneumonia.  Troponin negative x2 ?-Check A1c, FLP ?-Aspirin 81 mg daily empirically ?-As needed morphine, nitroglycerin ? ?Headache ?Etiology is not clear.  CT head negative.  No focal neurologic deficit on physical examination ?-As needed Tylenol ? ?Chronic diastolic CHF (congestive heart failure) ([place]) ?2D echo on 04/11/2021 showed EF 60 to 65% with grade 1 diastolic dysfunction.  Patient has 2+ leg edema, BNP slightly elevated 218, but no pulm edema chest x-ray.  Does not seem to have CHF exacerbation.  Patient is on as needed Lasix at home ?-Schedule Lasix 20 mg daily ?-Continue home spironolactone ?-f/u lower extremity Doppler to rule out DVT ? ?Alcohol abuse ?- In remission for more than 1 year ? ?Essential hypertension ?- IV hydralazine as needed ?-Cozaar ?-Patient is also on Lasix and spironolactone ? ? ?Consultants: None ?Procedures performed: None ?Disposition: Home ?Diet recommendation:  ?Discharge Diet Orders (From admission, onward)  ? ?  Start     Ordered  ? 06/27/21 0000  Diet - low sodium heart healthy       ? 06/27/21 1222  ? ?  ?  ? ?  ? ?Cardiac diet ?DISCHARGE MEDICATION: ?Allergies as of 06/27/2021   ? ?   Reactions  ? Adalat [nifedipine]   ? Foot swelling.   ? Amlodipine Other (See Comments)  ? Leg swelling  ? ?  ? ?  ?Medication List  ?  ? ?STOP taking these medications   ? ?oxymetazoline 0.05 % nasal spray ?Commonly known as: AFRIN ?  ? ?  ? ?TAKE these medications   ? ?acetaminophen 325 MG tablet ?Commonly known as: TYLENOL ?Take 2 tablets (650 mg total) by mouth every 6 (six) hours as needed for mild pain or fever. ?  ?alum & mag hydroxide-simeth 200-200-20 MG/5ML suspension ?Commonly known as: MAALOX/MYLANTA ?Take 30 mLs by mouth every 6 (six) hours as needed for indigestion or heartburn. ?  ?amoxicillin-clavulanate 875-125 MG tablet ?Commonly known as: AUGMENTIN ?Take 1 tablet by mouth 2 (two) times daily for  5 days. ?  ?aspirin 81 MG EC tablet ?Take 1 tablet (81 mg total) by mouth daily. Swallow whole. ?  ?azithromycin 250 MG tablet ?Commonly known as: Zithromax Z-Pak ?Take 2 tablets (500 mg) on  Day 1,  followed by 1 tablet (250 mg) once daily on Days 2 through 5. ?  ?baclofen 10 MG tablet ?Commonly known as: LIORESAL ?Take 1 tablet (10 mg total) by mouth 2 (two) times daily. ?  ?cyanocobalamin 1000 MCG tablet ?Take 1 tablet (1,000 mcg total) by mouth daily. ?  ?dextromethorphan-guaiFENesin 30-600 MG 12hr tablet ?Commonly known as: [place] DM ?Take 1 tablet by mouth 2 (two) times daily as needed for cough. ?  ?feeding supplement Liqd ?Take 237 mLs by mouth 3 (three) times daily between meals. ?  ?furosemide 20 MG tablet ?Commonly known as: LASIX ?Take 2 tablets (40 mg total) by mouth daily. ?What changed:  ?how much to take ?when to take this ?reasons to take this ?  ?losartan 100 MG tablet ?Commonly known as: COZAAR ?Take 1 tablet (100 mg total) by mouth daily. ?  ?multivitamin with minerals Tabs tablet ?  Take 1 tablet by mouth daily. ?  ?oxyCODONE-acetaminophen 5-325 MG tablet ?Commonly known as: PERCOCET/ROXICET ?Take 1 tablet by mouth every 4 (four) hours as needed for moderate pain. ?  ?pantoprazole 40 MG tablet ?Commonly known as: PROTONIX ?Take 1 tablet (40 mg total) by mouth daily at 12 noon. ?  ?saccharomyces boulardii 250 MG capsule ?Commonly known as: FLORASTOR ?Take 1 capsule (250 mg total) by mouth 2 (two) times daily. ?  ?sildenafil 20 MG tablet ?Commonly known as: REVATIO ?Take 1-5 pills about 30 min prior to sex. Start with 1 and increase as needed. ?  ?spironolactone 25 MG tablet ?Commonly known as: ALDACTONE ?Take 1 tablet (25 mg total) by mouth daily. ?  ?Vitamin D (Ergocalciferol) 1.25 MG (50000 UNIT) Caps capsule ?Commonly known as: DRISDOL ?Take 1 capsule (50,000 Units total) by mouth every 7 (seven) days. ?  ? ?  ? ? ?Discharge Exam: ?Filed Weights  ? 06/26/21 1116 06/27/21 0354  ?Weight: 105.6 kg  106.2 kg  ? ?General.     In no acute distress. ?Pulmonary.  Few right-sided basal rhonchi, normal respiratory effort. ?CV.  Regular rate and rhythm, no JVD, rub or murmur. ?Abdomen.  Soft, nontender

## 2021-06-27 NOTE — TOC CM/SW Note (Signed)
Patient has orders to discharge home today. Chart reviewed. PCP is [name], DO. On room air. Has bruising. No TOC needs identified. CSW signing off. ? ?Dayton Scrape, [place] ?[phone] ? ?

## 2021-07-01 LAB — CULTURE, BLOOD (ROUTINE X 2)
Culture: NO GROWTH
Culture: NO GROWTH

## 2021-07-02 ENCOUNTER — Ambulatory Visit: Payer: Medicare Other | Admitting: Family Medicine

## 2021-07-02 LAB — LEGIONELLA PNEUMOPHILA SEROGP 1 UR AG: L. pneumophila Serogp 1 Ur Ag: NEGATIVE

## 2021-07-04 DIAGNOSIS — R600 Localized edema: Secondary | ICD-10-CM | POA: Diagnosis not present

## 2021-07-04 DIAGNOSIS — R0609 Other forms of dyspnea: Secondary | ICD-10-CM | POA: Diagnosis not present

## 2021-07-04 DIAGNOSIS — Z87891 Personal history of nicotine dependence: Secondary | ICD-10-CM | POA: Insufficient documentation

## 2021-07-04 DIAGNOSIS — F1011 Alcohol abuse, in remission: Secondary | ICD-10-CM | POA: Insufficient documentation

## 2021-07-04 DIAGNOSIS — I10 Essential (primary) hypertension: Secondary | ICD-10-CM | POA: Diagnosis not present

## 2021-07-04 DIAGNOSIS — Z09 Encounter for follow-up examination after completed treatment for conditions other than malignant neoplasm: Secondary | ICD-10-CM | POA: Diagnosis not present

## 2021-07-07 ENCOUNTER — Encounter (INDEPENDENT_AMBULATORY_CARE_PROVIDER_SITE_OTHER): Payer: Self-pay | Admitting: Nurse Practitioner

## 2021-07-07 NOTE — Progress Notes (Signed)
? ?Subjective:  ? ? Patient ID: John Booker, male    DOB: 1950-11-16, 71 y.o.   MRN: [id number] ?Chief Complaint  ?Patient presents with  ? New Patient (Initial Visit)  ?  Ref Kar[place]os bilateral le edema  ? ? ?Patient is seen for evaluation of leg swelling. The patient first noticed the swelling remotely but is now concerned because of a significant increase in the overall edema. The swelling isn't associated with significant pain.  There has been an increasing amount of  discoloration noted by the patient. The patient notes that in the morning the legs are improved but they steadily worsened throughout the course of the day. Elevation seems to make the swelling of the legs better, dependency makes them much worse.  ? ?There is no history of ulcerations associated with the swelling.  ? ?The patient denies any recent changes in their medications. ? ?The patient has not been wearing graduated compression. ? ?The patient has no had any past angiography, interventions or vascular surgery. ? ?The patient denies a history of DVT or PE. There is no prior history of phlebitis. ?There is no history of primary lymphedema. ? ?There is no history of radiation treatment to the groin or pelvis ?No history of malignancies. ?No history of trauma or groin or pelvic surgery. ?No history of foreign travel or parasitic infections area  ?  ? ? ?Review of Systems  ?Cardiovascular:  Positive for leg swelling.  ?All other systems reviewed and are negative. ? ?   ?Objective:  ? Physical Exam ?Vitals reviewed.  ?HENT:  ?   Head: Normocephalic.  ?Cardiovascular:  ?   Rate and Rhythm: Normal rate.  ?   Pulses: Normal pulses.  ?Pulmonary:  ?   Effort: Pulmonary effort is normal.  ?Musculoskeletal:  ?   Right lower leg: Edema present.  ?   Left lower leg: Edema present.  ?Skin: ?   General: Skin is warm and dry.  ?Neurological:  ?   Mental Status: He is alert and oriented to person, place, and time.  ?Psychiatric:     ?   Mood and Affect:  Mood normal.     ?   Behavior: Behavior normal.     ?   Thought Content: Thought content normal.     ?   Judgment: Judgment normal.  ? ? ?BP (!) 179/112 (BP Location: Right Arm)   Pulse 79   Resp 16   Wt 232 lb 12.8 oz (105.6 kg)   BMI 29.10 kg/m?  ? ?Past Medical History:  ?Diagnosis Date  ? Hypertension   ? ? ?Social History  ? ?Socioeconomic History  ? Marital status: Legally Separated  ?  Spouse name: Not on file  ? Number of children: Not on file  ? Years of education: Not on file  ? Highest education level: Not on file  ?Occupational History  ? Not on file  ?Tobacco Use  ? Smoking status: Former  ?  Packs/day: 0.50  ?  Years: 38.00  ?  Pack years: 19.00  ?  Types: Cigarettes  ? Smokeless tobacco: Former  ?Vaping Use  ? Vaping Use: Never used  ?Substance and Sexual Activity  ? Alcohol use: Not Currently  ?  Comment: several months,[place]  ? Drug use: No  ? Sexual activity: Not on file  ?Other Topics Concern  ? Not on file  ?Social History Narrative  ? Not on file  ? ?Social Determinants of Health  ? ?Emergency planning/management officer  Strain: Not on file  ?Food Insecurity: Not on file  ?Transportation Needs: Not on file  ?Physical Activity: Not on file  ?Stress: Not on file  ?Social Connections: Not on file  ?Intimate Partner Violence: Not on file  ? ? ?Past Surgical History:  ?Procedure Laterality Date  ? ESOPHAGOGASTRODUODENOSCOPY (EGD) WITH PROPOFOL N/A 05/26/2018  ? Procedure: ESOPHAGOGASTRODUODENOSCOPY (EGD) WITH PROPOFOL;  Surgeon: Virgel Manifold, MD;  Location: [facility] ENDOSCOPY;  Service: Endoscopy;  Laterality: N/A;  ? ESOPHAGOGASTRODUODENOSCOPY (EGD) WITH PROPOFOL N/A 12/28/2020  ? Procedure: ESOPHAGOGASTRODUODENOSCOPY (EGD) WITH PROPOFOL;  Surgeon: Annamaria Helling, DO;  Location: [facility] ENDOSCOPY;  Service: Gastroenterology;  Laterality: N/A;  ? INCISION AND DRAINAGE PERIRECTAL ABSCESS N/A 11/19/2015  ? Procedure: IRRIGATION AND DEBRIDEMENT PERIRECTAL ABSCESS and debridement of scrotal abscess;  Surgeon:  Florene Glen, MD;  Location: [facility] ORS;  Service: General;  Laterality: N/A;  ? INCISION AND DRAINAGE PERIRECTAL ABSCESS N/A 11/20/2015  ? Procedure: IRRIGATION AND DEBRIDEMENT PERIRECTAL ABSCESS / WITH DRESSING CHANGE;  Surgeon: Florene Glen, MD;  Location: [facility] ORS;  Service: General;  Laterality: N/A;  peri rectal site  ? INCISION AND DRAINAGE PERIRECTAL ABSCESS N/A 11/21/2015  ? Procedure: IRRIGATION AND DEBRIDEMENT PERIRECTAL ABSCESS WITH DRESSING CHANGE;  Surgeon: Florene Glen, MD;  Location: [facility] ORS;  Service: General;  Laterality: N/A;  ? [place]  ? ? ?Family History  ?Problem Relation Age of Onset  ? Hypertension Father   ? ? ?Allergies  ?Allergen Reactions  ? Adalat [Nifedipine]   ?  Foot swelling.   ? Amlodipine Other (See Comments)  ?  Leg swelling  ? ? ? ?  Latest Ref Rng & Units 06/26/2021  ? 11:18 AM 12/30/2020  ?  5:00 AM 12/29/2020  ?  4:55 AM  ?CBC  ?WBC 4.0 - 10.5 K/uL 10.4   6.7   8.1    ?Hemoglobin 13.0 - 17.0 g/dL 16.7   12.8   13.4    ?Hematocrit 39.0 - 52.0 % 51.5   38.1   39.5    ?Platelets 150 - 400 K/uL 177   147   132    ? ? ? ? ?CMP  ?   ?Component Value Date/Time  ? NA 134 (L) 06/27/2021 0556  ? NA 135 (L) 04/15/2012 2049  ? K 3.8 06/27/2021 0556  ? K 3.5 04/15/2012 2049  ? CL 101 06/27/2021 0556  ? CL 98 04/15/2012 2049  ? CO2 29 06/27/2021 0556  ? CO2 30 04/15/2012 2049  ? GLUCOSE 121 (H) 06/27/2021 0556  ? GLUCOSE 107 (H) 04/15/2012 2049  ? BUN 22 06/27/2021 0556  ? BUN 17 04/15/2012 2049  ? CREATININE 1.01 06/27/2021 0556  ? CREATININE 1.15 12/20/2020 0946  ? CALCIUM 8.5 (L) 06/27/2021 0556  ? CALCIUM 8.5 04/15/2012 2049  ? PROT 7.2 06/26/2021 1118  ? PROT 7.3 04/15/2012 2049  ? ALBUMIN 4.3 06/26/2021 1118  ? ALBUMIN 3.4 04/15/2012 2049  ? AST 18 06/26/2021 1118  ? AST 72 (H) 04/15/2012 2049  ? ALT 15 06/26/2021 1118  ? ALT 63 04/15/2012 2049  ? ALKPHOS 54 06/26/2021 1118  ? ALKPHOS 88 04/15/2012 2049  ? BILITOT 1.5 (H) 06/26/2021 1118  ? BILITOT 0.8  04/15/2012 2049  ? GFRNONAA >60 06/27/2021 0556  ? GFRNONAA 73 12/20/2019 0916  ? GFRAA 85 12/20/2019 0916  ? ? ? ?No results found. ? ?   ?Assessment & Plan:  ? ?1. Bilateral lower extremity edema ?Recommend: ? ?I  have had a long discussion with the patient regarding swelling and why it  causes symptoms.  Patient will begin wearing graduated compression on a daily basis a prescription was given. The patient will  wear the stockings first thing in the morning and removing them in the evening. The patient is instructed specifically not to sleep in the stockings.  ? ?In addition, behavioral modification will be initiated.  This will include frequent elevation, use of over the counter pain medications and exercise such as walking. ? ?Consideration for a lymph pump will also be made based upon the effectiveness of conservative therapy.  This would help to improve the edema control and prevent sequela such as ulcers and infections  ? ?Patient should undergo duplex ultrasound of the venous system to ensure that DVT or reflux is not present. ? ?The patient will follow-up with me after the ultrasound.   ? ?2. Essential hypertension ?Continue antihypertensive medications as already ordered, these medications have been reviewed and there are no changes at this time.  Patient's blood pressure extremely elevated with a headache.  We discharged the patient's PCP due to issues with his blood pressure regulation in the past.  Discussed with PCP and they will reach out to the patient with instructions and follow-up. ? ? ?Current Outpatient Medications on File Prior to Visit  ?Medication Sig Dispense Refill  ? baclofen (LIORESAL) 10 MG tablet Take 1 tablet (10 mg total) by mouth 2 (two) times daily. 180 each 3  ? feeding supplement (ENSURE ENLIVE / ENSURE PLUS) LIQD Take 237 mLs by mouth 3 (three) times daily between meals. 237 mL 12  ? losartan (COZAAR) 100 MG tablet Take 1 tablet (100 mg total) by mouth daily. 90 tablet 1  ?  Multiple Vitamin (MULTIVITAMIN WITH MINERALS) TABS tablet Take 1 tablet by mouth daily.    ? saccharomyces boulardii (FLORASTOR) 250 MG capsule Take 1 capsule (250 mg total) by mouth 2 (two) times daily. 60 capsul

## 2021-07-23 DIAGNOSIS — M5020 Other cervical disc displacement, unspecified cervical region: Secondary | ICD-10-CM | POA: Insufficient documentation

## 2021-07-23 DIAGNOSIS — I5032 Chronic diastolic (congestive) heart failure: Secondary | ICD-10-CM | POA: Diagnosis not present

## 2021-07-23 DIAGNOSIS — I110 Hypertensive heart disease with heart failure: Secondary | ICD-10-CM | POA: Diagnosis not present

## 2021-07-29 ENCOUNTER — Other Ambulatory Visit: Payer: Self-pay | Admitting: Family Medicine

## 2021-07-30 DIAGNOSIS — H2512 Age-related nuclear cataract, left eye: Secondary | ICD-10-CM | POA: Diagnosis not present

## 2021-07-30 DIAGNOSIS — H2513 Age-related nuclear cataract, bilateral: Secondary | ICD-10-CM | POA: Diagnosis not present

## 2021-07-30 DIAGNOSIS — H25043 Posterior subcapsular polar age-related cataract, bilateral: Secondary | ICD-10-CM | POA: Diagnosis not present

## 2021-07-30 DIAGNOSIS — H18413 Arcus senilis, bilateral: Secondary | ICD-10-CM | POA: Diagnosis not present

## 2021-07-30 DIAGNOSIS — H25013 Cortical age-related cataract, bilateral: Secondary | ICD-10-CM | POA: Diagnosis not present

## 2021-08-19 ENCOUNTER — Other Ambulatory Visit: Payer: Self-pay | Admitting: Family Medicine

## 2021-08-19 DIAGNOSIS — I10 Essential (primary) hypertension: Secondary | ICD-10-CM

## 2021-09-03 DIAGNOSIS — M216X1 Other acquired deformities of right foot: Secondary | ICD-10-CM | POA: Diagnosis not present

## 2021-09-03 DIAGNOSIS — Z872 Personal history of diseases of the skin and subcutaneous tissue: Secondary | ICD-10-CM | POA: Diagnosis not present

## 2021-09-03 DIAGNOSIS — M778 Other enthesopathies, not elsewhere classified: Secondary | ICD-10-CM | POA: Diagnosis not present

## 2021-09-04 ENCOUNTER — Telehealth: Payer: Self-pay | Admitting: Gastroenterology

## 2021-09-04 NOTE — Telephone Encounter (Signed)
Daughter left a detailed voicemail stating that the patient is needing to be seen before his appointment on 09/24/2021. States patient has been hospitalized twice for this and she needs to know whether she needs to take him somewhere else to be seen sooner or he will end up being hospitalized again. Daughter also states that the patients doctor said he needs an esophageal manometry test. States if we cannot do this test then she needs to know so she can take him somewhere else. States he cannot keep any food down and is malnutrition and has a hernia in his esophagus. Asking if she should come here or go straight to [facility] or [place]. Requesting a call back. [phone]. Daughter and Scientific laboratory technician. States if we do not think we can help him let her know so we can cancel and get him the help he needs.  Chronic hiccups Esophageal hernia    ~I did call patients daughter and left her a vm letting her know we received her vm and that I sent a message.

## 2021-09-05 NOTE — Telephone Encounter (Signed)
Can you please call the daughter and let her know that her father is seen sooner than other patients. She is more than welcome to reach out at other places for her father to be seen. I'm sorry but Dr. Vicente Males is already full scheduled.

## 2021-09-24 ENCOUNTER — Other Ambulatory Visit: Payer: Self-pay

## 2021-09-24 ENCOUNTER — Ambulatory Visit: Payer: Medicare Other | Admitting: Gastroenterology

## 2021-09-24 ENCOUNTER — Encounter: Payer: Self-pay | Admitting: Gastroenterology

## 2021-09-24 ENCOUNTER — Telehealth: Payer: Self-pay

## 2021-09-24 VITALS — BP 182/90 | HR 84 | Temp 98.0°F | Ht 75.0 in | Wt 231.0 lb

## 2021-09-24 DIAGNOSIS — R1310 Dysphagia, unspecified: Secondary | ICD-10-CM | POA: Diagnosis not present

## 2021-09-24 DIAGNOSIS — R933 Abnormal findings on diagnostic imaging of other parts of digestive tract: Secondary | ICD-10-CM | POA: Diagnosis not present

## 2021-09-24 DIAGNOSIS — R634 Abnormal weight loss: Secondary | ICD-10-CM | POA: Diagnosis not present

## 2021-09-24 DIAGNOSIS — R066 Hiccough: Secondary | ICD-10-CM

## 2021-09-24 NOTE — Addendum Note (Signed)
Addended by: Vanetta Mulders on: 09/24/2021 04:20 PM   Modules accepted: Orders

## 2021-09-24 NOTE — Progress Notes (Signed)
[name] MD, MRCP(U.K) [address]  [place], [place] [address]  Main: [phone]  Fax: [phone]   Gastroenterology Consultation  Referring Provider:     [name] * Primary Care Physician:  Pcp, No Primary Gastroenterologist:  Dr. [name]  Reason for Consultation:     Transfer of care from Dr. Bonna Booker  HPI:   John Booker is Booker 71 y.o. y/o male He was initially seen in May 2019 by Dr. Bonna Booker for hiccups.  This has been going on for over 6 months.  Underwent an upper endoscopy in March 2020 that showed no gross abnormalities.  Hiatal hernia was noted.Subsequently the patient was admitted to [facility] in October 2020.  Prior history of alcohol abuse hypertension and on admission was noted to have intractable hiccups and weight loss was placed on Booker Thorazine drip and GI was consulted for Booker weight loss of over 20 pounds in 2 years.  Barium swallow showed esophageal dysmotility and spasm and inability of the lower esophagus to relax and there were concerns for obstructive mass.  Underwent an EGD was recommended outpatient CT chest abdomen and pelvis, colonoscopy for weight loss, outpatient esophageal manometry and motility.  12/28/2020 EGD: Showed erosions in the duodenal bulb hiatal hernia erythema in the stomach abnormal motility was noted in the esophagus increased tone of the LES dilated empirically to 51 [place] biopsies showed chronic duodenitis reactive gastropathy  06/27/2021 creatinine 1.01 UDS positive for opiates hemoglobin 16.7 g hepatic function panel showed no significant abnormality   He states that he has had hiccups for over 5 years.  Only thing that has worked as baclofen to an extent.  Weight loss has kind of stopped and he has stabilized around 230 pounds weight no recent colonoscopy he does have dysphagia on and off for many years for solids and also has history of regurgitation.  Past Medical History:  Diagnosis Date    Hypertension     Past Surgical History:  Procedure Laterality Date   ESOPHAGOGASTRODUODENOSCOPY (EGD) WITH PROPOFOL N/Booker 05/26/2018   Procedure: ESOPHAGOGASTRODUODENOSCOPY (EGD) WITH PROPOFOL;  Surgeon: John Manifold, MD;  Location: [facility] ENDOSCOPY;  Service: Endoscopy;  Laterality: N/Booker;   ESOPHAGOGASTRODUODENOSCOPY (EGD) WITH PROPOFOL N/Booker 12/28/2020   Procedure: ESOPHAGOGASTRODUODENOSCOPY (EGD) WITH PROPOFOL;  Surgeon: John Helling, DO;  Location: [facility] ENDOSCOPY;  Service: Gastroenterology;  Laterality: N/Booker;   INCISION AND DRAINAGE PERIRECTAL ABSCESS N/Booker 11/19/2015   Procedure: IRRIGATION AND DEBRIDEMENT PERIRECTAL ABSCESS and debridement of scrotal abscess;  Surgeon: John Glen, MD;  Location: [facility] ORS;  Service: General;  Laterality: N/Booker;   INCISION AND DRAINAGE PERIRECTAL ABSCESS N/Booker 11/20/2015   Procedure: IRRIGATION AND DEBRIDEMENT PERIRECTAL ABSCESS / WITH DRESSING CHANGE;  Surgeon: John Glen, MD;  Location: [facility] ORS;  Service: General;  Laterality: N/Booker;  peri rectal site   INCISION AND DRAINAGE PERIRECTAL ABSCESS N/Booker 11/21/2015   Procedure: IRRIGATION AND DEBRIDEMENT PERIRECTAL ABSCESS WITH DRESSING CHANGE;  Surgeon: John Glen, MD;  Location: [facility] ORS;  Service: General;  Laterality: N/Booker;   [place]    Prior to Admission medications   Medication Sig Start Date End Date Taking? Authorizing Provider  acetaminophen (TYLENOL) 325 MG tablet Take 2 tablets (650 mg total) by mouth every 6 (six) hours as needed for mild pain or fever. 06/27/21   Lorella Nimrod, MD  alum & mag hydroxide-simeth (MAALOX/MYLANTA) 200-200-20 MG/5ML suspension Take 30 mLs by mouth every 6 (six) hours as needed for  indigestion or heartburn. 06/27/21   Lorella Nimrod, MD  aspirin EC 81 MG EC tablet Take 1 tablet (81 mg total) by mouth daily. Swallow whole. 06/27/21   Lorella Nimrod, MD  baclofen (LIORESAL) 10 MG tablet Take 1 tablet (10 mg total) by mouth 2 (two) times  daily. 05/13/21   Karamalegos, Devonne Doughty, DO  dextromethorphan-guaiFENesin (MUCINEX DM) 30-600 MG 12hr tablet Take 1 tablet by mouth 2 (two) times daily as needed for cough. 06/27/21   Lorella Nimrod, MD  feeding supplement (ENSURE ENLIVE / ENSURE PLUS) LIQD Take 237 mLs by mouth 3 (three) times daily between meals. 12/31/20   John Slocumb, DO  furosemide (LASIX) 20 MG tablet Take 2 tablets (40 mg total) by mouth daily. 06/27/21   Lorella Nimrod, MD  losartan (COZAAR) 100 MG tablet Take 1 tablet (100 mg total) by mouth daily. 05/13/21   Karamalegos, Devonne Doughty, DO  Multiple Vitamin (MULTIVITAMIN WITH MINERALS) TABS tablet Take 1 tablet by mouth daily. 12/31/20   John Slocumb, DO  oxyCODONE-acetaminophen (PERCOCET/ROXICET) 5-325 MG tablet Take 1 tablet by mouth every 4 (four) hours as needed for moderate pain. 06/27/21   Lorella Nimrod, MD  pantoprazole (PROTONIX) 40 MG tablet Take 1 tablet (40 mg total) by mouth daily at 12 noon. 06/27/21   Lorella Nimrod, MD  saccharomyces boulardii (FLORASTOR) 250 MG capsule Take 1 capsule (250 mg total) by mouth 2 (two) times daily. 12/31/20   John Kindred A, DO  sildenafil (REVATIO) 20 MG tablet Take 1-5 pills about 30 min prior to sex. Start with 1 and increase as needed. Patient not taking: Reported on 06/20/2021 12/20/19   John Hauser, DO  spironolactone (ALDACTONE) 25 MG tablet Take 1 tablet (25 mg total) by mouth daily. 06/21/21   Karamalegos, Devonne Doughty, DO  vitamin B-12 1000 MCG tablet Take 1 tablet (1,000 mcg total) by mouth daily. 12/31/20   John Slocumb, DO  Vitamin D, Ergocalciferol, (DRISDOL) 1.25 MG (50000 UNIT) CAPS capsule Take 1 capsule (50,000 Units total) by mouth every 7 (seven) days. 01/03/21   John Slocumb, DO    Family History  Problem Relation Age of Onset   Hypertension Father      Social History   Tobacco Use   Smoking status: Former    Packs/day: 0.50    Years: 38.00    Total pack years: 19.00     Types: Cigarettes   Smokeless tobacco: Former  Scientific laboratory technician Use: Never used  Substance Use Topics   Alcohol use: Not Currently    Comment: several months,[place]   Drug use: No    Allergies as of 09/24/2021 - Review Complete 07/07/2021  Allergen Reaction Noted   Adalat [nifedipine]  12/05/2015   Amlodipine Other (See Comments) 07/14/2017    Review of Systems:    All systems reviewed and negative except where noted in HPI.   Physical Exam:  BP (!) 182/90 (BP Location: Right Arm, Patient Position: Sitting, Cuff Size: Normal) Comment: Has not taken BP Meds  Pulse 84   Temp 98 F (36.7 C) (Oral)  No LMP for male patient. Psych:  Alert and cooperative. Normal mood and affect. General:   Alert,  Well-developed, well-nourished, pleasant and cooperative in NAD Head:  Normocephalic and atraumatic. Eyes:  Sclera clear, no icterus.   Conjunctiva pink. Ears:  Normal auditory acuity. Neurologic:  Alert and oriented x3;  grossly normal neurologically. Psych:  Alert and cooperative. Normal mood and affect.  Imaging Studies: No results found.  Assessment and Plan:   John Booker is Booker 71 y.o. y/o male here to see me after being seen by Dr. Virgina Jock in the hospital when he was consulted for hiccups.  Ongoing for over 5 years and EGD showed increased tone of the lower esophagealsphincter.  Based on history my concern is he may have Booker achalazia.  Causes for hiccups may be central in the brain or peripheral along the course of the phrenic nerve or related to the diaphragm   Plan 1.  CT chest abdomen pelvis to rule out any abnormalities in the diaphragm along the course of the phrenic nerve.  If negative will obtain MRI of the brain. 2.  Colonoscopy to complete evaluation of unintentional weight loss and colon cancer screening 3.  Refer to [facility] for esophageal manometry, pH impedance testing to rule out achalasia   I have discussed alternative options, risks & benefits,  which include,  but are not limited to, bleeding, infection, perforation,respiratory complication & drug reaction.  The patient agrees with this plan & written consent will be obtained.     Follow up in video visit in 3 to 4 months  Dr [name] MD,MRCP(U.K)

## 2021-09-24 NOTE — Telephone Encounter (Signed)
Left voice message with patients daughter to give her appt details for her fathers CT Chest Abd. Appt is scheduled for Tuesday 10/01/21 at [place].  Her father should be NPO 4 hours prior to CT Scan, and she will need to pick up contrast at [facility].  Asked her to please call back to confirm appt information was received.  Thanks, [place], [place]

## 2021-09-24 NOTE — Addendum Note (Signed)
Addended by: Vanetta Mulders on: 09/24/2021 04:19 PM   Modules accepted: Orders

## 2021-10-01 ENCOUNTER — Telehealth: Payer: Self-pay

## 2021-10-01 ENCOUNTER — Ambulatory Visit
Admission: RE | Admit: 2021-10-01 | Discharge: 2021-10-01 | Disposition: A | Discharge status: Home / Self Care | Payer: Medicare Other | Source: Ambulatory Visit | Attending: Gastroenterology | Admitting: Gastroenterology

## 2021-10-01 DIAGNOSIS — I700 Atherosclerosis of aorta: Secondary | ICD-10-CM | POA: Diagnosis not present

## 2021-10-01 DIAGNOSIS — R590 Localized enlarged lymph nodes: Secondary | ICD-10-CM | POA: Diagnosis not present

## 2021-10-01 DIAGNOSIS — R634 Abnormal weight loss: Secondary | ICD-10-CM | POA: Insufficient documentation

## 2021-10-01 DIAGNOSIS — R9341 Abnormal radiologic findings on diagnostic imaging of renal pelvis, ureter, or bladder: Secondary | ICD-10-CM

## 2021-10-01 DIAGNOSIS — R066 Hiccough: Secondary | ICD-10-CM | POA: Insufficient documentation

## 2021-10-01 DIAGNOSIS — J9859 Other diseases of mediastinum, not elsewhere classified: Secondary | ICD-10-CM | POA: Diagnosis not present

## 2021-10-01 DIAGNOSIS — K5730 Diverticulosis of large intestine without perforation or abscess without bleeding: Secondary | ICD-10-CM | POA: Diagnosis not present

## 2021-10-01 DIAGNOSIS — N200 Calculus of kidney: Secondary | ICD-10-CM | POA: Diagnosis not present

## 2021-10-01 DIAGNOSIS — R9389 Abnormal findings on diagnostic imaging of other specified body structures: Secondary | ICD-10-CM

## 2021-10-01 LAB — POCT I-STAT CREATININE: Creatinine, Ser: 1.1 mg/dL (ref 0.61–1.24)

## 2021-10-01 MED ORDER — IOHEXOL 300 MG/ML  SOLN
100.0000 mL | Freq: Once | INTRAMUSCULAR | Status: AC | PRN
  Administered 2021-10-01: 100 mL via INTRAVENOUS

## 2021-10-01 NOTE — Telephone Encounter (Signed)
Dr. Vicente Males, [facility] called and left a voicemail for you to review patient's CT Scan results. Can you please review and let me know if you need me to do anything.

## 2021-10-02 ENCOUNTER — Other Ambulatory Visit: Payer: Self-pay

## 2021-10-02 DIAGNOSIS — K229 Disease of esophagus, unspecified: Secondary | ICD-10-CM

## 2021-10-02 NOTE — Telephone Encounter (Signed)
Patient's daughter-Caressa called and she wanted to know what was going on with her dad. I checked if I was able to communicate with her first and then I let her know what his CT Scan showed and what Dr. Vicente Males recommends. Caressa agreed and she was able to schedule her dad's EGD and was going to wait for a call from urology and oncology to set-up appointments. I provided her with their numbers just in case her father misses their call. EGD was scheduled for 10/09/2021.

## 2021-10-02 NOTE — Telephone Encounter (Signed)
Inform  1. Scan is abnormal , may be a lesion in the chest causing the hiccups - needs further eval  2. Abnormality in the bladder seen - refer to urology  3. Abnormality seen in esophagus- needs EGD 4. Refer to Dr Janese Banks in Oncology to be seen by her to determine next steps in terms of lesion in thorax  Referrals were sent to urology and oncology as urgent.  Called patient to schedule his EGD but he did not answer my call. I will call him later on the day.

## 2021-10-02 NOTE — Progress Notes (Signed)
John Booker  Inform  1. Scan is abnormal , may be a lesion in the chest causing the hiccups - needs further eval  2. Abnormality in the bladder seen - refer to urology  3. Abnormality seen in esophagus- needs EGD 4. Refer to Dr John Booker in Oncology to be seen by her to determine next steps in terms of lesion in thorax   Dr [name] MD,MRCP [facility]) Gastroenterology/Hepatology Pager: [phone]

## 2021-10-04 ENCOUNTER — Encounter: Payer: Self-pay | Admitting: *Deleted

## 2021-10-09 ENCOUNTER — Ambulatory Visit
Admission: RE | Admit: 2021-10-09 | Discharge: 2021-10-09 | Disposition: A | Discharge status: Home / Self Care | Payer: Medicare Other | Source: Ambulatory Visit | Attending: Gastroenterology | Admitting: Gastroenterology

## 2021-10-09 ENCOUNTER — Ambulatory Visit: Payer: Medicare Other | Admitting: Anesthesiology

## 2021-10-09 ENCOUNTER — Encounter
Admission: RE | Disposition: A | Discharge status: Home / Self Care | Payer: Self-pay | Source: Ambulatory Visit | Attending: Gastroenterology

## 2021-10-09 ENCOUNTER — Encounter: Payer: Self-pay | Admitting: Gastroenterology

## 2021-10-09 DIAGNOSIS — K209 Esophagitis, unspecified without bleeding: Secondary | ICD-10-CM | POA: Diagnosis not present

## 2021-10-09 DIAGNOSIS — K229 Disease of esophagus, unspecified: Secondary | ICD-10-CM

## 2021-10-09 DIAGNOSIS — I509 Heart failure, unspecified: Secondary | ICD-10-CM | POA: Diagnosis not present

## 2021-10-09 DIAGNOSIS — R933 Abnormal findings on diagnostic imaging of other parts of digestive tract: Secondary | ICD-10-CM

## 2021-10-09 DIAGNOSIS — K3189 Other diseases of stomach and duodenum: Secondary | ICD-10-CM | POA: Insufficient documentation

## 2021-10-09 DIAGNOSIS — Z79899 Other long term (current) drug therapy: Secondary | ICD-10-CM | POA: Insufficient documentation

## 2021-10-09 DIAGNOSIS — Z87891 Personal history of nicotine dependence: Secondary | ICD-10-CM | POA: Insufficient documentation

## 2021-10-09 DIAGNOSIS — I110 Hypertensive heart disease with heart failure: Secondary | ICD-10-CM | POA: Insufficient documentation

## 2021-10-09 DIAGNOSIS — K208 Other esophagitis without bleeding: Secondary | ICD-10-CM

## 2021-10-09 DIAGNOSIS — K317 Polyp of stomach and duodenum: Secondary | ICD-10-CM | POA: Diagnosis not present

## 2021-10-09 DIAGNOSIS — K2980 Duodenitis without bleeding: Secondary | ICD-10-CM | POA: Insufficient documentation

## 2021-10-09 HISTORY — PX: ESOPHAGOGASTRODUODENOSCOPY (EGD) WITH PROPOFOL: SHX5813

## 2021-10-09 SURGERY — ESOPHAGOGASTRODUODENOSCOPY (EGD) WITH PROPOFOL
Anesthesia: General

## 2021-10-09 MED ORDER — SODIUM CHLORIDE 0.9 % IV SOLN: INTRAVENOUS | Status: DC

## 2021-10-09 MED ORDER — PROPOFOL 10 MG/ML IV BOLUS
INTRAVENOUS | Status: DC | PRN
  Administered 2021-10-09: 60 mg via INTRAVENOUS

## 2021-10-09 MED ORDER — PROPOFOL 500 MG/50ML IV EMUL
INTRAVENOUS | Status: DC | PRN
  Administered 2021-10-09: 120 ug/kg/min via INTRAVENOUS

## 2021-10-09 MED ORDER — LIDOCAINE HCL (CARDIAC) PF 100 MG/5ML IV SOSY
PREFILLED_SYRINGE | INTRAVENOUS | Status: DC | PRN
  Administered 2021-10-09: 80 mg via INTRAVENOUS

## 2021-10-09 MED ORDER — LABETALOL HCL 5 MG/ML IV SOLN
INTRAVENOUS | Status: DC | PRN
  Administered 2021-10-09: 5 mg via INTRAVENOUS

## 2021-10-09 NOTE — Anesthesia Postprocedure Evaluation (Signed)
Anesthesia Post Note  Patient: John Booker  Procedure(s) Performed: ESOPHAGOGASTRODUODENOSCOPY (EGD) WITH PROPOFOL  Patient location during evaluation: PACU Anesthesia Type: General Level of consciousness: awake and alert Pain management: satisfactory to patient Vital Signs Assessment: post-procedure vital signs reviewed and stable Respiratory status: spontaneous breathing Cardiovascular status: stable (Elevated BP's, patient took none of his antihypertensive's this am) Anesthetic complications: no   No notable events documented.   Last Vitals:  Vitals:   10/09/21 1313 10/09/21 1323  BP: (!) 169/71 (!) 176/101  Pulse:    Resp:    Temp: (!) 36 C   SpO2:      Last Pain:  Vitals:   10/09/21 1323  TempSrc:   PainSc: 0-No pain                 VAN STAVEREN,Jacobe Study

## 2021-10-09 NOTE — H&P (Signed)
John Bellows, MD [address], [place], [place], [place], [address] [address], [place], [place], [place], [address] Phone: [phone]  Fax: [phone]  Primary Care Physician:  Dion Body, MD   Pre-Procedure History & Physical: HPI:  John Booker is a 71 y.o. male is here for an endoscopy    Past Medical History:  Diagnosis Date   Hypertension     Past Surgical History:  Procedure Laterality Date   ESOPHAGOGASTRODUODENOSCOPY (EGD) WITH PROPOFOL N/A 05/26/2018   Procedure: ESOPHAGOGASTRODUODENOSCOPY (EGD) WITH PROPOFOL;  Surgeon: Virgel Manifold, MD;  Location: [facility] ENDOSCOPY;  Service: Endoscopy;  Laterality: N/A;   ESOPHAGOGASTRODUODENOSCOPY (EGD) WITH PROPOFOL N/A 12/28/2020   Procedure: ESOPHAGOGASTRODUODENOSCOPY (EGD) WITH PROPOFOL;  Surgeon: Annamaria Helling, DO;  [facility]COPY;  Service: Gastroenterology;  Laterality: N/A;   INCISION AND DRAINAGE PERIRECTAL ABSCESS N/A 11/19/2015   Procedure: IRRIGATION AND DEBRIDEMENT PERIRECTAL ABSCESS and debridement of scrotal abscess;  Surgeon: Florene Glen, MD;  Location: [facility] ORS;  Service: General;  Laterality: N/A;   INCISION AND DRAINAGE PERIRECTAL ABSCESS N/A 11/20/2015   Procedure: IRRIGATION AND DEBRIDEMENT PERIRECTAL ABSCESS / WITH DRESSING CHANGE;  Surgeon: Florene Glen, MD;  Location: [facility] ORS;  Service: General;  Laterality: N/A;  peri rectal site   INCISION AND DRAINAGE PERIRECTAL ABSCESS N/A 11/21/2015   Procedure: IRRIGATION AND DEBRIDEMENT PERIRECTAL ABSCESS WITH DRESSING CHANGE;  Surgeon: Florene Glen, MD;  Location: [facility] ORS;  Service: General;  Laterality: N/A;   [place]    Prior to Admission medications   Medication Sig Start Date End Date Taking? Authorizing Provider  aspirin EC 81 MG EC tablet Take 1 tablet (81 mg total) by mouth daily. Swallow whole. 06/27/21  Yes Lorella Nimrod, MD  acetaminophen (TYLENOL) 325 MG tablet Take 2 tablets (650  mg total) by mouth every 6 (six) hours as needed for mild pain or fever. Patient not taking: Reported on 09/24/2021 06/27/21   Lorella Nimrod, MD  alum & mag hydroxide-simeth (MAALOX/MYLANTA) 200-200-20 MG/5ML suspension Take 30 mLs by mouth every 6 (six) hours as needed for indigestion or heartburn. Patient not taking: Reported on 09/24/2021 06/27/21   Lorella Nimrod, MD  baclofen (LIORESAL) 10 MG tablet Take 1 tablet (10 mg total) by mouth 2 (two) times daily. 05/13/21   Karamalegos, Devonne Doughty, DO  dextromethorphan-guaiFENesin (MUCINEX DM) 30-600 MG 12hr tablet Take 1 tablet by mouth 2 (two) times daily as needed for cough. Patient not taking: Reported on 09/24/2021 06/27/21   Lorella Nimrod, MD  feeding supplement (ENSURE ENLIVE / ENSURE PLUS) LIQD Take 237 mLs by mouth 3 (three) times daily between meals. 12/31/20   Ezekiel Slocumb, DO  furosemide (LASIX) 20 MG tablet Take 2 tablets (40 mg total) by mouth daily. 06/27/21   Lorella Nimrod, MD  losartan (COZAAR) 100 MG tablet Take 1 tablet (100 mg total) by mouth daily. 05/13/21   Karamalegos, Devonne Doughty, DO  Multiple Vitamin (MULTIVITAMIN WITH MINERALS) TABS tablet Take 1 tablet by mouth daily. 12/31/20   Ezekiel Slocumb, DO  oxyCODONE-acetaminophen (PERCOCET/ROXICET) 5-325 MG tablet Take 1 tablet by mouth every 4 (four) hours as needed for moderate pain. Patient not taking: Reported on 09/24/2021 06/27/21   Lorella Nimrod, MD  pantoprazole (PROTONIX) 40 MG tablet Take 1 tablet (40 mg total) by mouth daily at 12 noon. 06/27/21   Lorella Nimrod, MD  saccharomyces boulardii (FLORASTOR) 250 MG capsule Take 1 capsule (250 mg total) by mouth 2 (  two) times daily. 12/31/20   Nicole Kindred A, DO  sildenafil (REVATIO) 20 MG tablet Take 1-5 pills about 30 min prior to sex. Start with 1 and increase as needed. Patient not taking: Reported on 09/24/2021 12/20/19   Olin Hauser, DO  spironolactone (ALDACTONE) 25 MG tablet Take 1 tablet (25 mg total) by mouth  daily. 06/21/21   Karamalegos, Devonne Doughty, DO  vitamin B-12 1000 MCG tablet Take 1 tablet (1,000 mcg total) by mouth daily. 12/31/20   Ezekiel Slocumb, DO  Vitamin D, Ergocalciferol, (DRISDOL) 1.25 MG (50000 UNIT) CAPS capsule Take 1 capsule (50,000 Units total) by mouth every 7 (seven) days. 01/03/21   Nicole Kindred A, DO    Allergies as of 10/02/2021 - Review Complete 10/01/2021  Allergen Reaction Noted   Adalat [nifedipine]  12/05/2015   Amlodipine Other (See Comments) 07/14/2017    Family History  Problem Relation Age of Onset   Hypertension Father     Social History   Socioeconomic History   Marital status: Legally Separated    Spouse name: Not on file   Number of children: Not on file   Years of education: Not on file   Highest education level: Not on file  Occupational History   Not on file  Tobacco Use   Smoking status: Former    Packs/day: 0.50    Years: 38.00    Total pack years: 19.00    Types: Cigarettes   Smokeless tobacco: Former  Scientific laboratory technician Use: Never used  Substance and Sexual Activity   Alcohol use: Not Currently    Comment: several months,[place]   Drug use: No   Sexual activity: Not on file  Other Topics Concern   Not on file  Social History Narrative   Not on file   Social Determinants of Health   Financial Resource Strain: Not on file  Food Insecurity: Not on file  Transportation Needs: Not on file  Physical Activity: Not on file  Stress: Not on file  Social Connections: Not on file  Intimate Partner Violence: Not on file    Review of Systems: See HPI, otherwise negative ROS  Physical Exam: BP (!) 203/105   Pulse 69   Temp (!) 96.4 F (35.8 C) (Temporal)   Resp 16   Ht '6\' 3"'$  (1.905 m)   Wt 108.9 kg   SpO2 100%   BMI 30.00 kg/m  General:   Alert,  pleasant and cooperative in NAD Head:  Normocephalic and atraumatic. Neck:  Supple; no masses or thyromegaly. Lungs:  Clear throughout to auscultation, normal  respiratory effort.    Heart:  +S1, +S2, Regular rate and rhythm, No edema. Abdomen:  Soft, nontender and nondistended. Normal bowel sounds, without guarding, and without rebound.   Neurologic:  Alert and  oriented x4;  grossly normal neurologically.  Impression/Plan: [name] is here for an endoscopy  to be performed for  evaluation of abnormal ct scan of chest     Risks, benefits, limitations, and alternatives regarding endoscopy have been reviewed with the patient.  Questions have been answered.  All parties agreeable.   John Bellows, MD  10/09/2021, 12:50 PM

## 2021-10-09 NOTE — Anesthesia Preprocedure Evaluation (Signed)
Anesthesia Evaluation  Patient identified by MRN, date of birth, ID band Patient awake and Patient confused    Reviewed: Allergy & Precautions, NPO status , Patient's Chart, lab work & pertinent test results  Airway Mallampati: III  TM Distance: >3 FB Neck ROM: Full    Dental  (+) Teeth Intact   Pulmonary neg pulmonary ROS, Patient abstained from smoking., former smoker,    Pulmonary exam normal breath sounds clear to auscultation       Cardiovascular Exercise Tolerance: Poor hypertension, Pt. on medications +CHF  negative cardio ROS Normal cardiovascular exam Rhythm:Regular     Neuro/Psych  Headaches, negative neurological ROS  negative psych ROS   GI/Hepatic negative GI ROS, Neg liver ROS, hiatal hernia,   Endo/Other  negative endocrine ROS  Renal/GU negative Renal ROS  negative genitourinary   Musculoskeletal   Abdominal (+) + obese,   Peds negative pediatric ROS (+)  Hematology negative hematology ROS (+)   Anesthesia Other Findings Past Medical History: No date: Hypertension  Past Surgical History: 05/26/2018: ESOPHAGOGASTRODUODENOSCOPY (EGD) WITH PROPOFOL; N/A     Comment:  Procedure: ESOPHAGOGASTRODUODENOSCOPY (EGD) WITH               PROPOFOL;  Surgeon: Virgel Manifold, MD;  Location:               [facility] ENDOSCOPY;  Service: Endoscopy;  Laterality: N/A; 12/28/2020: ESOPHAGOGASTRODUODENOSCOPY (EGD) WITH PROPOFOL; N/A     Comment:  Procedure: ESOPHAGOGASTRODUODENOSCOPY (EGD) WITH               PROPOFOL;  Surgeon: Annamaria Helling, DO;  Location:              [facility] ENDOSCOPY;  Service: Gastroenterology;  Laterality:               N/A; 11/19/2015: INCISION AND DRAINAGE PERIRECTAL ABSCESS; N/A     Comment:  Procedure: IRRIGATION AND DEBRIDEMENT PERIRECTAL ABSCESS              and debridement of scrotal abscess;  Surgeon: Florene Glen, MD;  Location: [facility] ORS;  Service: General;                 Laterality: N/A; 11/20/2015: INCISION AND DRAINAGE PERIRECTAL ABSCESS; N/A     Comment:  Procedure: IRRIGATION AND DEBRIDEMENT PERIRECTAL ABSCESS              / WITH DRESSING CHANGE;  Surgeon: Florene Glen, MD;                Location: [facility] ORS;  Service: General;  Laterality: N/A;               peri rectal site 11/21/2015: INCISION AND DRAINAGE PERIRECTAL ABSCESS; N/A     Comment:  Procedure: IRRIGATION AND DEBRIDEMENT PERIRECTAL ABSCESS              WITH DRESSING CHANGE;  Surgeon: Florene Glen, MD;                Location: [facility] ORS;  Service: General;  Laterality: N/A; 1989: MINOR HEMORRHOIDECTOMY  BMI    Body Mass Index: 30.00 kg/m      Reproductive/Obstetrics negative OB ROS                             Anesthesia Physical Anesthesia Plan  ASA: 3  Anesthesia Plan: General   Post-op Pain Management:    Induction: Intravenous  PONV Risk Score and Plan: Propofol infusion and TIVA  Airway Management Planned: Natural Airway  Additional Equipment:   Intra-op Plan:   Post-operative Plan:   Informed Consent: I have reviewed the patients History and Physical, chart, labs and discussed the procedure including the risks, benefits and alternatives for the proposed anesthesia with the patient or authorized representative who has indicated his/her understanding and acceptance.     Dental Advisory Given  Plan Discussed with: CRNA and Surgeon  Anesthesia Plan Comments:         Anesthesia Quick Evaluation

## 2021-10-09 NOTE — Op Note (Signed)
[facility] Gastroenterology Patient Name: John Booker Procedure Date: 10/09/2021 12:48 PM MRN: [id number] Account #: [id number] Date of Birth: 1950/05/21 Admit Type: Outpatient Age: 71 Room: [facility] ENDO ROOM 3 Gender: Male Note Status: Finalized Instrument Name: Upper Endoscope [id number] Procedure:             Upper GI endoscopy Indications:           Abnormal CT of the GI tract Providers:             [name] MD, MD Referring MD:          No Local Md, MD (Referring MD) Medicines:             Monitored Anesthesia Care Complications:         No immediate complications. Procedure:             Pre-Anesthesia Assessment:                        - Prior to the procedure, a History and Physical was                         performed, and patient medications, allergies and                         sensitivities were reviewed. The patient's tolerance                         of previous anesthesia was reviewed.                        - The risks and benefits of the procedure and the                         sedation options and risks were discussed with the                         patient. All questions were answered and informed                         consent was obtained.                        - ASA Grade Assessment: III - A patient with severe                         systemic disease.                        After obtaining informed consent, the endoscope was                         passed under direct vision. Throughout the procedure,                         the patient's blood pressure, pulse, and oxygen                         saturations were monitored continuously. The Endoscope  was introduced through the mouth, and advanced to the                         third part of duodenum. The upper GI endoscopy was                         accomplished with ease. The patient tolerated the                         procedure well. Findings:      LA Grade B  (one or more mucosal breaks greater than 5 mm, not extending       between the tops of two mucosal folds) esophagitis with no bleeding was       found at the gastroesophageal junction.      The stomach was normal.      The cardia and gastric fundus were normal on retroflexion.      Localized moderate inflammation characterized by congestion (edema) and       erythema was found in the duodenal bulb. Biopsies were taken with a cold       forceps for histology. Impression:            - LA Grade B esophagitis with no bleeding.                        - Normal stomach.                        - Duodenitis. Biopsied. Recommendation:        - Await pathology results.                        - Discharge patient to home (with escort).                        - Resume previous diet.                        - Use Prilosec (omeprazole) 40 mg PO BID for 4 months.                        - Repeat upper endoscopy in 2 months to evaluate the                         response to therapy.                        - Return to GI office as previously scheduled. Procedure Code(s):     --- Professional ---                        [id number], Esophagogastroduodenoscopy, flexible,                         transoral; with biopsy, single or multiple Diagnosis Code(s):     --- Professional ---                        K20.90, Esophagitis, unspecified without bleeding  K29.80, Duodenitis without bleeding                        R93.3, Abnormal findings on diagnostic imaging of                         other parts of digestive tract CPT copyright 2019 American Medical Association. All rights reserved. The codes documented in this report are preliminary and upon coder review may  be revised to meet current compliance requirements. [name], MD [name] MD, MD 10/09/2021 1:08:46 PM This report has been signed electronically. Number of Addenda: 0 Note Initiated On: 10/09/2021 12:48 PM Estimated Blood Loss:   Estimated blood loss: none.      [facility]

## 2021-10-09 NOTE — Transfer of Care (Signed)
Immediate Anesthesia Transfer of Care Note  Patient: John Booker  Procedure(s) Performed: ESOPHAGOGASTRODUODENOSCOPY (EGD) WITH PROPOFOL  Patient Location: PACU  Anesthesia Type:General  Level of Consciousness: awake, alert  and oriented  Airway & Oxygen Therapy: Patient Spontanous Breathing  Post-op Assessment: Report given to RN and Post -op Vital signs reviewed and stable  Post vital signs: Reviewed and stable  Last Vitals:  Vitals Value Taken Time  BP 169/91 10/09/21 1314  Temp 36 C 10/09/21 1313  Pulse 81 10/09/21 1315  Resp 19 10/09/21 1315  SpO2 94 % 10/09/21 1315  Vitals shown include unvalidated device data.  Last Pain:  Vitals:   10/09/21 1313  TempSrc: Temporal  PainSc:          Complications: No notable events documented.

## 2021-10-10 ENCOUNTER — Ambulatory Visit
Admission: RE | Admit: 2021-10-10 | Discharge: 2021-10-10 | Disposition: A | Discharge status: Home / Self Care | Payer: Medicare Other | Source: Ambulatory Visit | Attending: Gastroenterology | Admitting: Gastroenterology

## 2021-10-10 ENCOUNTER — Encounter: Payer: Self-pay | Admitting: Gastroenterology

## 2021-10-10 ENCOUNTER — Other Ambulatory Visit: Payer: Self-pay | Admitting: Family Medicine

## 2021-10-10 DIAGNOSIS — R9389 Abnormal findings on diagnostic imaging of other specified body structures: Secondary | ICD-10-CM | POA: Insufficient documentation

## 2021-10-10 DIAGNOSIS — M47814 Spondylosis without myelopathy or radiculopathy, thoracic region: Secondary | ICD-10-CM | POA: Diagnosis not present

## 2021-10-10 DIAGNOSIS — I10 Essential (primary) hypertension: Secondary | ICD-10-CM

## 2021-10-10 DIAGNOSIS — R222 Localized swelling, mass and lump, trunk: Secondary | ICD-10-CM | POA: Diagnosis not present

## 2021-10-10 DIAGNOSIS — M2578 Osteophyte, vertebrae: Secondary | ICD-10-CM | POA: Diagnosis not present

## 2021-10-10 LAB — SURGICAL PATHOLOGY

## 2021-10-10 MED ORDER — GADOBUTROL 1 MMOL/ML IV SOLN
10.0000 mL | Freq: Once | INTRAVENOUS | Status: AC | PRN
  Administered 2021-10-10: 10 mL via INTRAVENOUS

## 2021-10-10 NOTE — Progress Notes (Signed)
Inform   MRI is abnormal : he must follow up with oncolgy, please let oncology navigator get in touch with him so he is not missed. Inform pcp of mri results

## 2021-10-11 ENCOUNTER — Other Ambulatory Visit: Payer: Self-pay | Admitting: Family Medicine

## 2021-10-11 DIAGNOSIS — I10 Essential (primary) hypertension: Secondary | ICD-10-CM

## 2021-10-17 ENCOUNTER — Inpatient Hospital Stay: Payer: Medicare Other

## 2021-10-17 ENCOUNTER — Encounter: Payer: Self-pay | Admitting: Oncology

## 2021-10-17 ENCOUNTER — Inpatient Hospital Stay: Payer: Medicare Other | Attending: Oncology | Admitting: Oncology

## 2021-10-17 VITALS — BP 135/74 | HR 88 | Temp 98.7°F | Resp 20 | Wt 221.7 lb

## 2021-10-17 DIAGNOSIS — Z87891 Personal history of nicotine dependence: Secondary | ICD-10-CM | POA: Diagnosis not present

## 2021-10-17 DIAGNOSIS — R066 Hiccough: Secondary | ICD-10-CM | POA: Insufficient documentation

## 2021-10-17 DIAGNOSIS — R222 Localized swelling, mass and lump, trunk: Secondary | ICD-10-CM | POA: Insufficient documentation

## 2021-10-17 DIAGNOSIS — R9389 Abnormal findings on diagnostic imaging of other specified body structures: Secondary | ICD-10-CM | POA: Diagnosis not present

## 2021-10-17 DIAGNOSIS — K229 Disease of esophagus, unspecified: Secondary | ICD-10-CM | POA: Diagnosis not present

## 2021-10-17 LAB — COMPREHENSIVE METABOLIC PANEL
ALT: 19 U/L (ref 0–44)
AST: 18 U/L (ref 15–41)
Albumin: 4.3 g/dL (ref 3.5–5.0)
Alkaline Phosphatase: 66 U/L (ref 38–126)
Anion gap: 10 (ref 5–15)
BUN: 15 mg/dL (ref 8–23)
CO2: 32 mmol/L (ref 22–32)
Calcium: 9.7 mg/dL (ref 8.9–10.3)
Chloride: 93 mmol/L — ABNORMAL LOW (ref 98–111)
Creatinine, Ser: 1.09 mg/dL (ref 0.61–1.24)
GFR, Estimated: >60 mL/min (ref >60)
Glucose, Bld: 104 mg/dL — ABNORMAL HIGH (ref 70–99)
Potassium: 3.4 mmol/L — ABNORMAL LOW (ref 3.5–5.1)
Sodium: 135 mmol/L (ref 135–145)
Total Bilirubin: 0.8 mg/dL (ref 0.3–1.2)
Total Protein: 7.6 g/dL (ref 6.5–8.1)

## 2021-10-17 LAB — CBC WITH DIFFERENTIAL/PLATELET
Abs Immature Granulocytes: 0.02 10*3/uL (ref 0.00–0.07)
Basophils Absolute: 0.1 10*3/uL (ref 0.0–0.1)
Basophils Relative: 1 %
Eosinophils Absolute: 0.1 10*3/uL (ref 0.0–0.5)
Eosinophils Relative: 2 %
HCT: 50.7 % (ref 39.0–52.0)
Hemoglobin: 17.1 g/dL — ABNORMAL HIGH (ref 13.0–17.0)
Immature Granulocytes: 0 %
Lymphocytes Relative: 22 %
Lymphs Abs: 1.1 10*3/uL (ref 0.7–4.0)
MCH: 32.7 pg (ref 26.0–34.0)
MCHC: 33.7 g/dL (ref 30.0–36.0)
MCV: 96.9 fL (ref 80.0–100.0)
Monocytes Absolute: 0.5 10*3/uL (ref 0.1–1.0)
Monocytes Relative: 10 %
Neutro Abs: 3.4 10*3/uL (ref 1.7–7.7)
Neutrophils Relative %: 65 %
Platelets: 260 10*3/uL (ref 150–400)
RBC: 5.23 MIL/uL (ref 4.22–5.81)
RDW: 14.6 % (ref 11.5–15.5)
WBC: 5.2 10*3/uL (ref 4.0–10.5)
nRBC: 0 % (ref 0.0–0.2)

## 2021-10-17 LAB — PSA: Prostatic Specific Antigen: 1.34 ng/mL (ref 0.00–4.00)

## 2021-10-17 LAB — LACTATE DEHYDROGENASE: LDH: 127 U/L (ref 98–192)

## 2021-10-17 NOTE — Progress Notes (Signed)
[place]  Telephone:([phone] Fax:([phone]) [phone]  ID: John Booker OB: 1951-01-01  MR#: [id number]  CSN#:[id number]  Patient Care Team: Dion Body, MD as PCP - General (Family Medicine)  CHIEF COMPLAINT: Mediastinal masses  INTERVAL HISTORY: Patient is a 71 year old male who was seeing GI for persistent hiccups who underwent imaging which revealed several soft tissue mediastinal masses of unclear etiology.  Other than patient's hiccups, he feels well.  He has no neurologic complaints.  He denies any recent fevers or illnesses.  He has a good appetite and denies weight loss.  He has no chest pain, shortness of breath, cough, or hemoptysis.  He denies any nausea, vomiting, constipation, or diarrhea.  He has no urinary complaints.  Patient offers no further specific complaints today.  REVIEW OF SYSTEMS:   Review of Systems  Constitutional: Negative.  Negative for fever, malaise/fatigue and weight loss.  Respiratory: Negative.  Negative for cough, hemoptysis and shortness of breath.   Cardiovascular: Negative.  Negative for chest pain and leg swelling.  Gastrointestinal: Negative.  Negative for abdominal pain.  Genitourinary: Negative.  Negative for dysuria.  Musculoskeletal: Negative.  Negative for back pain.  Skin: Negative.  Negative for rash.  Neurological: Negative.  Negative for dizziness, focal weakness, weakness and headaches.  Psychiatric/Behavioral: Negative.  The patient is not nervous/anxious.     As per HPI. Otherwise, a complete review of systems is negative.  PAST MEDICAL HISTORY: Past Medical History:  Diagnosis Date   Hypertension     PAST SURGICAL HISTORY: Past Surgical History:  Procedure Laterality Date   ESOPHAGOGASTRODUODENOSCOPY (EGD) WITH PROPOFOL N/A 05/26/2018   Procedure: ESOPHAGOGASTRODUODENOSCOPY (EGD) WITH PROPOFOL;  Surgeon: Virgel Manifold, MD;  Location: [facility] ENDOSCOPY;  Service: Endoscopy;  Laterality: N/A;    ESOPHAGOGASTRODUODENOSCOPY (EGD) WITH PROPOFOL N/A 12/28/2020   Procedure: ESOPHAGOGASTRODUODENOSCOPY (EGD) WITH PROPOFOL;  Surgeon: Annamaria Helling, DO;  Location: [facility] ENDOSCOPY;  Service: Gastroenterology;  Laterality: N/A;   ESOPHAGOGASTRODUODENOSCOPY (EGD) WITH PROPOFOL N/A 10/09/2021   Procedure: ESOPHAGOGASTRODUODENOSCOPY (EGD) WITH PROPOFOL;  Surgeon: [name], MD;  Location: [facility] ENDOSCOPY;  Service: Gastroenterology;  Laterality: N/A;   INCISION AND DRAINAGE PERIRECTAL ABSCESS N/A 11/19/2015   Procedure: IRRIGATION AND DEBRIDEMENT PERIRECTAL ABSCESS and debridement of scrotal abscess;  Surgeon: Florene Glen, MD;  Location: [facility] ORS;  Service: General;  Laterality: N/A;   INCISION AND DRAINAGE PERIRECTAL ABSCESS N/A 11/20/2015   Procedure: IRRIGATION AND DEBRIDEMENT PERIRECTAL ABSCESS / WITH DRESSING CHANGE;  Surgeon: Florene Glen, MD;  Location: [facility] ORS;  Service: General;  Laterality: N/A;  peri rectal site   INCISION AND DRAINAGE PERIRECTAL ABSCESS N/A 11/21/2015   Procedure: IRRIGATION AND DEBRIDEMENT PERIRECTAL ABSCESS WITH DRESSING CHANGE;  Surgeon: Florene Glen, MD;  Location: [facility] ORS;  Service: General;  Laterality: N/A;   MINOR HEMORRHOIDECTOMY  1989    FAMILY HISTORY: Family History  Problem Relation Age of Onset   Hypertension Father     ADVANCED DIRECTIVES (Y/N):  N  HEALTH MAINTENANCE: Social History   Tobacco Use   Smoking status: Former    Packs/day: 0.50    Years: 38.00    Total pack years: 19.00    Types: Cigarettes   Smokeless tobacco: Former  Scientific laboratory technician Use: Never used  Substance Use Topics   Alcohol use: Not Currently    Comment: several months,[place]   Drug use: No     Colonoscopy:  PAP:  Bone density:  Lipid panel:  Allergies  Allergen Reactions  Adalat [Nifedipine]     Foot swelling.    Amlodipine Other (See Comments)    Leg swelling    Current Outpatient Medications  Medication Sig Dispense Refill    aspirin EC 81 MG EC tablet Take 1 tablet (81 mg total) by mouth daily. Swallow whole. 30 tablet 11   baclofen (LIORESAL) 10 MG tablet Take 1 tablet (10 mg total) by mouth 2 (two) times daily. 180 each 3   feeding supplement (ENSURE ENLIVE / ENSURE PLUS) LIQD Take 237 mLs by mouth 3 (three) times daily between meals. 237 mL 12   furosemide (LASIX) 20 MG tablet Take 2 tablets (40 mg total) by mouth daily. 60 tablet 1   losartan (COZAAR) 100 MG tablet Take 1 tablet (100 mg total) by mouth daily. 90 tablet 1   Multiple Vitamin (MULTIVITAMIN WITH MINERALS) TABS tablet Take 1 tablet by mouth daily.     omeprazole (PRILOSEC) 20 MG capsule Take 20 mg by mouth in the morning and at bedtime.     spironolactone (ALDACTONE) 25 MG tablet Take 1 tablet (25 mg total) by mouth daily. 30 tablet 2   vitamin B-12 1000 MCG tablet Take 1 tablet (1,000 mcg total) by mouth daily. 30 tablet 1   Vitamin D, Ergocalciferol, (DRISDOL) 1.25 MG (50000 UNIT) CAPS capsule Take 1 capsule (50,000 Units total) by mouth every 7 (seven) days. 5 capsule 3   acetaminophen (TYLENOL) 325 MG tablet Take 2 tablets (650 mg total) by mouth every 6 (six) hours as needed for mild pain or fever. (Patient not taking: Reported on 09/24/2021) 60 tablet 0   alum & mag hydroxide-simeth (MAALOX/MYLANTA) 200-200-20 MG/5ML suspension Take 30 mLs by mouth every 6 (six) hours as needed for indigestion or heartburn. (Patient not taking: Reported on 09/24/2021) 355 mL 0   dextromethorphan-guaiFENesin (MUCINEX DM) 30-600 MG 12hr tablet Take 1 tablet by mouth 2 (two) times daily as needed for cough. (Patient not taking: Reported on 09/24/2021) 30 tablet 0   oxyCODONE-acetaminophen (PERCOCET/ROXICET) 5-325 MG tablet Take 1 tablet by mouth every 4 (four) hours as needed for moderate pain. (Patient not taking: Reported on 09/24/2021) 20 tablet 0   pantoprazole (PROTONIX) 40 MG tablet Take 1 tablet (40 mg total) by mouth daily at 12 noon. (Patient not taking: Reported on  10/17/2021) 30 tablet 1   saccharomyces boulardii (FLORASTOR) 250 MG capsule Take 1 capsule (250 mg total) by mouth 2 (two) times daily. (Patient not taking: Reported on 10/17/2021) 60 capsule 1   sildenafil (REVATIO) 20 MG tablet Take 1-5 pills about 30 min prior to sex. Start with 1 and increase as needed. (Patient not taking: Reported on 09/24/2021) 30 tablet 3   No current facility-administered medications for this visit.    OBJECTIVE: Vitals:   10/17/21 1324  BP: 135/74  Pulse: 88  Resp: 20  Temp: 98.7 F (37.1 C)  SpO2: 99%     Body mass index is 27.71 kg/m.    ECOG FS:0 - Asymptomatic  General: Well-developed, well-nourished, no acute distress. Eyes: Pink conjunctiva, anicteric sclera. HEENT: Normocephalic, moist mucous membranes. Lungs: No audible wheezing or coughing. Heart: Regular rate and rhythm. Abdomen: Soft, nontender, no obvious distention. Musculoskeletal: No edema, cyanosis, or clubbing. Neuro: Alert, answering all questions appropriately. Cranial nerves grossly intact. Skin: No rashes or petechiae noted. Psych: Normal affect. Lymphatics: No cervical, calvicular, axillary or inguinal LAD.   LAB RESULTS:  Lab Results  Component Value Date   NA 135 10/17/2021   K 3.4 (  L) 10/17/2021   CL 93 (L) 10/17/2021   CO2 32 10/17/2021   GLUCOSE 104 (H) 10/17/2021   BUN 15 10/17/2021   CREATININE 1.09 10/17/2021   CALCIUM 9.7 10/17/2021   PROT 7.6 10/17/2021   ALBUMIN 4.3 10/17/2021   AST 18 10/17/2021   ALT 19 10/17/2021   ALKPHOS 66 10/17/2021   BILITOT 0.8 10/17/2021   GFRNONAA >60 10/17/2021   GFRAA 85 12/20/2019    Lab Results  Component Value Date   WBC 5.2 10/17/2021   NEUTROABS 3.4 10/17/2021   HGB 17.1 (H) 10/17/2021   HCT 50.7 10/17/2021   MCV 96.9 10/17/2021   PLT 260 10/17/2021     STUDIES: MR THORACIC SPINE W WO CONTRAST  Result Date: 10/10/2021 CLINICAL DATA:  Abnormality seen on recent CT chest EXAM: MRI THORACIC WITHOUT AND WITH  CONTRAST TECHNIQUE: Multiplanar and multiecho pulse sequences of the thoracic spine were obtained without and with intravenous contrast. CONTRAST:  57m GADAVIST GADOBUTROL 1 MMOL/ML IV SOLN COMPARISON:  CT a chest 10/01/2021, CT chest 06/26/2021 FINDINGS: Alignment:  Normal. Vertebrae: There is mild compression deformity of the superior T6 endplate without marrow edema to suggest recent fracture. The other vertebral bodies are preserved. Small foci of T1 hyperintensity in the T7 vertebral body likely reflect benign intraosseous hemangiomas. There is no suspicious marrow signal abnormality or marrow edema, or abnormal marrow enhancement. Cord:  Normal in signal and morphology. Paraspinal and other soft tissues: Lobulated partially fat density lesions are again seen in the left paraspinal soft tissues at T9-T10. There are apparently 3 separate lesions. One lesion measures 1.6 cm x 1.1 cm and abuts the anterolateral margin of the left T9 transverse process (24-33). Just anteromedial to this lesion common there is a 2.7 cm x 1.8 cm lesion abutting the lateral margin of the T9 vertebral body. More inferiorly, there is a 2.9 cm x 2.2 cm lesion abutting the lateral margin of the T10 vertebral body/costovertebral junction. The lesions demonstrate internal fat density as well as enhancement. There is no extension into the adjacent neural foramina. The esophagus is patulous and fluid-filled. Disc levels: There are flowing anterior osteophytes in the thoracic spine consistent with diffuse idiopathic skeletal hyperostosis. Otherwise, there is overall mild degenerative change without significant spinal canal or neural foraminal stenosis. There is no evidence of cord or nerve root impingement. IMPRESSION: 1. Again seen are lobulated partially fat density enhancing masses in the left paraspinal soft tissues at T9-T10, with 3 distinct lesions identified. Given multiplicity, these lesions are favored to reflect extramedullary  hematopoiesis; however, liposarcoma is not entirely excluded. Recommend PET-CT for further evaluation. 2. Patulous and fluid-filled esophagus. Correlate with any history of reflux and recent EGD. Electronically Signed   By: PValetta MoleM.D.   On: 10/10/2021 14:20   CT CHEST ABDOMEN PELVIS W CONTRAST  Result Date: 10/01/2021 CLINICAL DATA:  Unintended weight loss and hiccups. * Tracking Code: BO *. EXAM: CT CHEST, ABDOMEN, AND PELVIS WITH CONTRAST TECHNIQUE: Multidetector CT imaging of the chest, abdomen and pelvis was performed following the standard protocol during bolus administration of intravenous contrast. RADIATION DOSE REDUCTION: This exam was performed according to the departmental dose-optimization program which includes automated exposure control, adjustment of the mA and/or kV according to patient size and/or use of iterative reconstruction technique. CONTRAST:  1043mOMNIPAQUE IOHEXOL 300 MG/ML  SOLN COMPARISON:  CTA chest June 26, 2021 FINDINGS: CT CHEST FINDINGS Cardiovascular: Aortic atherosclerosis. No central pulmonary embolus on this nondedicated study. Normal size  heart. Trace pericardial effusion is within physiologic normal limits. Coronary artery calcifications. Mediastinum/Nodes: No suspicious thyroid nodule. No pathologically enlarged mediastinal, hilar or axillary lymph nodes. Retained versus refluxed contrast in the esophagus with diffuse esophageal wall thickening similar in appearance to prior CT. Nodular soft tissue components within a lobular area of fat in the posterior mediastinum along the left side of the vertebral bodies measures approximally 5.3 x 2.9 x 2.7 cm in total on images 152/4 and 42/2 with the largest soft tissue component measuring 2.5 x 2.2 cm on image 42/2, new dating back to April 15, 2012. No erosions of the adjacent osseous structures identified. Lungs/Pleura: No suspicious pulmonary nodules or masses. Scarring versus atelectasis in the bilateral lung bases.  No pleural effusion. No pneumothorax. Musculoskeletal: No aggressive lytic or blastic lesion of bone. Sclerotic focus in the right glenoid and left humeral head are stable dating back to April 15, 2012 consistent with a benign finding. Multilevel degenerative changes spine. CT ABDOMEN PELVIS FINDINGS Hepatobiliary: Hypodense hepatic lesions measure up to 13 mm and are statistically likely reflect benign cysts or hemangiomas. Gallbladder is unremarkable. No biliary ductal dilation. Pancreas: No pancreatic ductal dilation or evidence of acute inflammation. Spleen: No splenomegaly or focal splenic lesion. Adrenals/Urinary Tract: Bilateral adrenal glands appear normal. No hydronephrosis. Nonobstructive renal stones measure up to 4 mm. No suspicious renal mass. Enhancing endophytic nodular focus in the posterior urinary bladder measures 11 mm on image 117/2. Stomach/Bowel: Radiopaque enteric contrast material traverses the rectum. Stomach is minimally distended limiting evaluation. No pathologic dilation of small or large bowel. No evidence of acute bowel inflammation. No suspicious colonic wall thickening or mass like lesions identified. Sigmoid colonic diverticulosis without findings of acute diverticulitis. Vascular/Lymphatic: Aortic atherosclerosis without aneurysmal dilation. No pathologically enlarged abdominal or pelvic lymph nodes. Reproductive: Enlarged prostate gland. Other: No significant abdominopelvic free fluid. Musculoskeletal: No aggressive lytic or blastic lesion of bone. Multilevel degenerative changes spine with flowing anterior vertebral osteophytes. Degenerative changes bilateral hips. Sclerotic focus in the left femoral neck is unchanged dating back to November 19, 2015 consistent with a benign finding. IMPRESSION: 1. Mixed fat and soft tissue lesion in the posterior mediastinum without underlying osseous erosions is nonspecific with primary differential considerations of extramedullary  hematopoiesis or liposarcoma. Suggest clinical evaluation with laboratory values for myelodysplastic disease and further imaging evaluation of the posterior mediastinal mass with PET-CT. 2. Enhancing endophytic nodular focus in the posterior urinary bladder measuring 11 mm but is incompletely evaluated on this study, primary differential considerations include urothelial neoplasm or a focus of nodular hyperplasia in the median lobe of the prostate gland, recommend urology consult. 3. Retained versus refluxed contrast in the esophagus with diffuse esophageal wall thickening suggestive of reflux esophagitis but incompletely evaluated on CT. Consider assessment with upper endoscopy to exclude underlying mass lesion. 4. Colonic diverticulosis without findings of acute diverticulitis. 5. Nonobstructive nephrolithiasis. 6.  Aortic Atherosclerosis (ICD10-I70.0). These results will be called to the ordering clinician or representative by the Radiologist Assistant, and communication documented in the PACS or [employer]. Electronically Signed   By: Dahlia Bailiff M.D.   On: 10/01/2021 15:43    ASSESSMENT: Mediastinal mass.  PLAN:    Mediastinal mass: CT scan results from October 01, 2021 reviewed independently and reported as above with posterior mediastinal masses measuring 5.3 x 2.9 x 2.7 cm that are new since February 2014.  Unclear if this is the etiology of patient's hiccups.  Have ordered laboratory work today for completeness and  we will get a PET scan in the next 1 to 2 weeks for further evaluation.  If PET scan is negative, will continue to follow masses with serial CT scans.  If PET scan is positive, patient will likely require biopsy.  He will have a video assisted telemedicine visit 2 to 3 days after the PET scan to discuss the results and additional diagnostic planning.  I spent a total of 60 minutes reviewing chart data, face-to-face evaluation with the patient, counseling and coordination of care as  detailed above.  Patient expressed understanding and was in agreement with this plan. He also understands that He can call clinic at any time with any questions, concerns, or complaints.    Cancer Staging  No matching staging information was found for the patient.  Lloyd Huger, MD   10/17/2021 3:13 PM

## 2021-10-19 LAB — AFP TUMOR MARKER: AFP, Serum, Tumor Marker: 3.5 ng/mL (ref 0.0–8.4)

## 2021-10-19 LAB — CEA: CEA: 2.1 ng/mL (ref 0.0–4.7)

## 2021-10-19 LAB — CANCER ANTIGEN 19-9: CA 19-9: 7 U/mL (ref 0–35)

## 2021-10-29 ENCOUNTER — Ambulatory Visit: Payer: Medicare Other | Admitting: Urology

## 2021-10-29 ENCOUNTER — Encounter: Payer: Self-pay | Admitting: Urology

## 2021-10-29 VITALS — BP 176/92 | HR 83 | Ht 75.0 in

## 2021-10-29 DIAGNOSIS — N3289 Other specified disorders of bladder: Secondary | ICD-10-CM | POA: Diagnosis not present

## 2021-10-29 MED ORDER — DIAZEPAM 10 MG PO TABS: 10.0000 mg | ORAL_TABLET | Freq: Once | ORAL | 0 refills | Status: AC

## 2021-10-29 NOTE — Patient Instructions (Signed)

## 2021-10-29 NOTE — Progress Notes (Signed)
10/29/21 12:44 PM   John Booker 1950/07/31 [id number]  Referring provider:  No referring provider defined for this encounter. Chief Complaint  Patient presents with   bladder mass       HPI: John Booker is a 71 y.o.male who presents today for further evaluation of abnormalities of bladder on CT scan.   He underwent a CT, abdomen, chest and pelvis on 10/01/2021 to further evaluate unintended weight loss. It visualized bilateral adrenal glands appearing normal with no hydronephrosis; Nonobstructive renal stones were visualized and measured up to 4 mm. No suspicious renal mass there's an enhancing endophytic nodule focus in the posterior urinary bladder that measures 11 mm.   He has a history of smoking but quit in 2022.   IPSS 7 below.  Denies any other symptoms other than some urinary frequency.  He is accompanied by his daughter today.   Patient denies any modifying or aggravating factors.  Patient denies any gross hematuria, dysuria or suprapubic/flank pain.  Patient denies any fevers, chills, nausea or vomiting.     IPSS     Row Name 10/29/21 1000         International Prostate Symptom Score   How often have you had the sensation of not emptying your bladder? Not at All     How often have you had to urinate less than every two hours? About half the time     How often have you found you stopped and started again several times when you urinated? Not at All     How often have you found it difficult to postpone urination? About half the time     How often have you had a weak urinary stream? Not at All     How often have you had to strain to start urination? Not at All     How many times did you typically get up at night to urinate? 1 Time     Total IPSS Score 7       Quality of Life due to urinary symptoms   If you were to spend the rest of your life with your urinary condition just the way it is now how would you feel about that? Pleased               Score:  1-7 Mild 8-19 Moderate 20-35 Severe    PMH: Past Medical History:  Diagnosis Date   Hypertension     Surgical History: Past Surgical History:  Procedure Laterality Date   ESOPHAGOGASTRODUODENOSCOPY (EGD) WITH PROPOFOL N/A 05/26/2018   Procedure: ESOPHAGOGASTRODUODENOSCOPY (EGD) WITH PROPOFOL;  Surgeon: Virgel Manifold, MD;  Location: [facility] ENDOSCOPY;  Service: Endoscopy;  Laterality: N/A;   ESOPHAGOGASTRODUODENOSCOPY (EGD) WITH PROPOFOL N/A 12/28/2020   Procedure: ESOPHAGOGASTRODUODENOSCOPY (EGD) WITH PROPOFOL;  Surgeon: Annamaria Helling, DO;  Location: [facility] ENDOSCOPY;  Service: Gastroenterology;  Laterality: N/A;   ESOPHAGOGASTRODUODENOSCOPY (EGD) WITH PROPOFOL N/A 10/09/2021   Procedure: ESOPHAGOGASTRODUODENOSCOPY (EGD) WITH PROPOFOL;  Surgeon: [name], MD;  Location: [facility] ENDOSCOPY;  Service: Gastroenterology;  Laterality: N/A;   INCISION AND DRAINAGE PERIRECTAL ABSCESS N/A 11/19/2015   Procedure: IRRIGATION AND DEBRIDEMENT PERIRECTAL ABSCESS and debridement of scrotal abscess;  Surgeon: Florene Glen, MD;  Location: [facility] ORS;  Service: General;  Laterality: N/A;   INCISION AND DRAINAGE PERIRECTAL ABSCESS N/A 11/20/2015   Procedure: IRRIGATION AND DEBRIDEMENT PERIRECTAL ABSCESS / WITH DRESSING CHANGE;  Surgeon: Florene Glen, MD;  Location: [facility] ORS;  Service: General;  Laterality: N/A;  peri rectal site   INCISION AND DRAINAGE PERIRECTAL ABSCESS N/A 11/21/2015   Procedure: IRRIGATION AND DEBRIDEMENT PERIRECTAL ABSCESS WITH DRESSING CHANGE;  Surgeon: Florene Glen, MD;  Location: [facility] ORS;  Service: General;  Laterality: N/A;   MINOR HEMORRHOIDECTOMY  1989    Home Medications:  Allergies as of 10/29/2021       Reactions   Adalat [nifedipine]    Foot swelling.    Amlodipine Other (See Comments)   Leg swelling        Medication List        Accurate as of October 29, 2021 12:44 PM. If you have any questions, ask your nurse or doctor.           STOP taking these medications    alum & mag hydroxide-simeth 200-200-20 MG/5ML suspension Commonly known as: MAALOX/MYLANTA Stopped by: Hollice Espy, MD   dextromethorphan-guaiFENesin 30-600 MG 12hr tablet Commonly known as: [place] DM Stopped by: Hollice Espy, MD   oxyCODONE-acetaminophen 5-325 MG tablet Commonly known as: PERCOCET/ROXICET Stopped by: Hollice Espy, MD   pantoprazole 40 MG tablet Commonly known as: PROTONIX Stopped by: Hollice Espy, MD   spironolactone 25 MG tablet Commonly known as: ALDACTONE Stopped by: Hollice Espy, MD       TAKE these medications    acetaminophen 325 MG tablet Commonly known as: TYLENOL Take 2 tablets (650 mg total) by mouth every 6 (six) hours as needed for mild pain or fever.   aspirin EC 81 MG tablet Take 1 tablet (81 mg total) by mouth daily. Swallow whole.   baclofen 10 MG tablet Commonly known as: LIORESAL Take 1 tablet (10 mg total) by mouth 2 (two) times daily.   cyanocobalamin 1000 MCG tablet Take 1 tablet (1,000 mcg total) by mouth daily.   diazepam 10 MG tablet Commonly known as: Valium Take 1 tablet (10 mg total) by mouth once for 1 dose. Take 1 hour prior to procedure. Please have a driver available. Started by: Hollice Espy, MD   feeding supplement Liqd Take 237 mLs by mouth 3 (three) times daily between meals.   furosemide 20 MG tablet Commonly known as: LASIX Take 2 tablets (40 mg total) by mouth daily.   losartan 100 MG tablet Commonly known as: COZAAR Take 1 tablet (100 mg total) by mouth daily.   multivitamin with minerals Tabs tablet Take 1 tablet by mouth daily.   omeprazole 20 MG capsule Commonly known as: PRILOSEC Take 20 mg by mouth in the morning and at bedtime.   saccharomyces boulardii 250 MG capsule Commonly known as: FLORASTOR Take 1 capsule (250 mg total) by mouth 2 (two) times daily.   sildenafil 20 MG tablet Commonly known as: REVATIO Take 1-5 pills about 30  min prior to sex. Start with 1 and increase as needed.   Vitamin D (Ergocalciferol) 1.25 MG (50000 UNIT) Caps capsule Commonly known as: DRISDOL Take 1 capsule (50,000 Units total) by mouth every 7 (seven) days.        Allergies:  Allergies  Allergen Reactions   Adalat [Nifedipine]     Foot swelling.    Amlodipine Other (See Comments)    Leg swelling    Family History: Family History  Problem Relation Age of Onset   Hypertension Father     Social History:  reports that he has quit smoking. His smoking use included cigarettes. He has a 19.00 pack-year smoking history. He has quit using smokeless tobacco. He reports that he does not currently use alcohol.  He reports that he does not use drugs.   Physical Exam: BP (!) 176/92   Pulse 83   Ht '6\' 3"'$  (1.905 m)   BMI 27.71 kg/m   Constitutional:  Alert and oriented, No acute distress. HEENT: [place] AT, moist mucus membranes.  Trachea midline, no masses. Cardiovascular: No clubbing, cyanosis, or edema. Respiratory: Normal respiratory effort, no increased work of breathing. Skin: No rashes, bruises or suspicious lesions. Neurologic: Grossly intact, no focal deficits, moving all 4 extremities. Psychiatric: Normal mood and affect.  Laboratory Data:  Lab Results  Component Value Date   CREATININE 1.09 10/17/2021   Lab Results  Component Value Date   HGBA1C 5.3 06/26/2021    Urinalysis Unremarkable   Pertinent Imaging: CLINICAL DATA:  Unintended weight loss and hiccups. * Tracking Code: BO *.   EXAM: CT CHEST, ABDOMEN, AND PELVIS WITH CONTRAST   TECHNIQUE: Multidetector CT imaging of the chest, abdomen and pelvis was performed following the standard protocol during bolus administration of intravenous contrast.   RADIATION DOSE REDUCTION: This exam was performed according to the departmental dose-optimization program which includes automated exposure control, adjustment of the mA and/or kV according to patient size  and/or use of iterative reconstruction technique.   CONTRAST:  148m OMNIPAQUE IOHEXOL 300 MG/ML  SOLN   COMPARISON:  CTA chest June 26, 2021   FINDINGS: CT CHEST FINDINGS   Cardiovascular: Aortic atherosclerosis. No central pulmonary embolus on this nondedicated study. Normal size heart. Trace pericardial effusion is within physiologic normal limits. Coronary artery calcifications.   Mediastinum/Nodes: No suspicious thyroid nodule. No pathologically enlarged mediastinal, hilar or axillary lymph nodes.   Retained versus refluxed contrast in the esophagus with diffuse esophageal wall thickening similar in appearance to prior CT.   Nodular soft tissue components within a lobular area of fat in the posterior mediastinum along the left side of the vertebral bodies measures approximally 5.3 x 2.9 x 2.7 cm in total on images 152/4 and 42/2 with the largest soft tissue component measuring 2.5 x 2.2 cm on image 42/2, new dating back to April 15, 2012. No erosions of the adjacent osseous structures identified.   Lungs/Pleura: No suspicious pulmonary nodules or masses. Scarring versus atelectasis in the bilateral lung bases. No pleural effusion. No pneumothorax.   Musculoskeletal: No aggressive lytic or blastic lesion of bone. Sclerotic focus in the right glenoid and left humeral head are stable dating back to April 15, 2012 consistent with a benign finding. Multilevel degenerative changes spine.   CT ABDOMEN PELVIS FINDINGS   Hepatobiliary: Hypodense hepatic lesions measure up to 13 mm and are statistically likely reflect benign cysts or hemangiomas. Gallbladder is unremarkable. No biliary ductal dilation.   Pancreas: No pancreatic ductal dilation or evidence of acute inflammation.   Spleen: No splenomegaly or focal splenic lesion.   Adrenals/Urinary Tract: Bilateral adrenal glands appear normal. No hydronephrosis. Nonobstructive renal stones measure up to 4 mm.  No suspicious renal mass. Enhancing endophytic nodular focus in the posterior urinary bladder measures 11 mm on image 117/2.   Stomach/Bowel: Radiopaque enteric contrast material traverses the rectum. Stomach is minimally distended limiting evaluation. No pathologic dilation of small or large bowel. No evidence of acute bowel inflammation. No suspicious colonic wall thickening or mass like lesions identified. Sigmoid colonic diverticulosis without findings of acute diverticulitis.   Vascular/Lymphatic: Aortic atherosclerosis without aneurysmal dilation. No pathologically enlarged abdominal or pelvic lymph nodes.   Reproductive: Enlarged prostate gland.   Other: No significant abdominopelvic free fluid.  Musculoskeletal: No aggressive lytic or blastic lesion of bone. Multilevel degenerative changes spine with flowing anterior vertebral osteophytes. Degenerative changes bilateral hips. Sclerotic focus in the left femoral neck is unchanged dating back to November 19, 2015 consistent with a benign finding.   IMPRESSION: 1. Mixed fat and soft tissue lesion in the posterior mediastinum without underlying osseous erosions is nonspecific with primary differential considerations of extramedullary hematopoiesis or liposarcoma. Suggest clinical evaluation with laboratory values for myelodysplastic disease and further imaging evaluation of the posterior mediastinal mass with PET-CT. 2. Enhancing endophytic nodular focus in the posterior urinary bladder measuring 11 mm but is incompletely evaluated on this study, primary differential considerations include urothelial neoplasm or a focus of nodular hyperplasia in the median lobe of the prostate gland, recommend urology consult. 3. Retained versus refluxed contrast in the esophagus with diffuse esophageal wall thickening suggestive of reflux esophagitis but incompletely evaluated on CT. Consider assessment with upper endoscopy to exclude  underlying mass lesion. 4. Colonic diverticulosis without findings of acute diverticulitis. 5. Nonobstructive nephrolithiasis. 6.  Aortic Atherosclerosis (ICD10-I70.0).   These results will be called to the ordering clinician or representative by the Radiologist Assistant, and communication documented in the PACS or [employer].     Electronically Signed   By: Dahlia Bailiff M.D.   On: 10/01/2021 15:43   I have personally reviewed the images and agree with radiologist interpretation.     Assessment & Plan:    Bladder lesion - We discussed the possibility of malignancy including bladder cancer versus portion of the median lobe.  On my own personal interpretation, its more consistent with a median lobe however unable to rule out bladder cancer.  He also has risk factors including smoking and as such, would recommend definitive imaging in the form of cystoscopy.   - He is very anxious and hesitant with proceeding with this. He was offered a valium which he would like for his cystoscopy.  - UA today unremarkable   Conley Rolls as a scribe for Hollice Espy, MD.,have documented all relevant documentation on the behalf of Hollice Espy, MD,as directed by  Hollice Espy, MD while in the presence of Hollice Espy, MD.  I have reviewed the above documentation for accuracy and completeness, and I agree with the above.   Hollice Espy, MD  [facility] [address] [place], [place] [address] [phone]

## 2021-10-30 LAB — URINALYSIS, COMPLETE
Bilirubin, UA: NEGATIVE
Glucose, UA: NEGATIVE
Ketones, UA: NEGATIVE
Leukocytes,UA: NEGATIVE
Nitrite, UA: NEGATIVE
RBC, UA: NEGATIVE
Specific Gravity, UA: 1.015 (ref 1.005–1.030)
Urobilinogen, Ur: 1 mg/dL (ref 0.2–1.0)
pH, UA: >9 — ABNORMAL HIGH (ref 5.0–7.5)

## 2021-10-30 LAB — MICROSCOPIC EXAMINATION

## 2021-10-31 DIAGNOSIS — I10 Essential (primary) hypertension: Secondary | ICD-10-CM | POA: Diagnosis not present

## 2021-10-31 DIAGNOSIS — R600 Localized edema: Secondary | ICD-10-CM | POA: Diagnosis not present

## 2021-10-31 DIAGNOSIS — I5032 Chronic diastolic (congestive) heart failure: Secondary | ICD-10-CM | POA: Diagnosis not present

## 2021-11-04 ENCOUNTER — Encounter: Admission: RE | Admit: 2021-11-04 | Payer: Medicare Other | Source: Ambulatory Visit

## 2021-11-05 ENCOUNTER — Encounter: Payer: Self-pay | Admitting: Urology

## 2021-11-05 ENCOUNTER — Telehealth: Payer: Self-pay | Admitting: Oncology

## 2021-11-05 ENCOUNTER — Ambulatory Visit: Payer: Medicare Other | Admitting: Urology

## 2021-11-05 VITALS — BP 173/93 | HR 96 | Ht 75.0 in | Wt 221.0 lb

## 2021-11-05 DIAGNOSIS — N3289 Other specified disorders of bladder: Secondary | ICD-10-CM | POA: Diagnosis not present

## 2021-11-05 NOTE — Telephone Encounter (Signed)
Patients daughter called to say PET was broke for appointment and they have rescheduled the PET to 9/6. Moved appointment with Dr. Grayland Ormond to after that.

## 2021-11-05 NOTE — Progress Notes (Signed)
   11/05/21  CC:  Chief Complaint  Patient presents with   Cysto    HPI: 71 year old male who presents today for cystoscopy for further evaluation of incidental bladder mass.  He is minimally symptomatic in terms of urinary issues.    NED. A&Ox3.   No respiratory distress   Abd soft, NT, ND Normal phallus with bilateral descended testicles  Cystoscopy Procedure Note  Patient identification was confirmed, informed consent was obtained, and patient was prepped using Betadine solution.  Lidocaine jelly was administered per urethral meatus.     Pre-Procedure: - Inspection reveals a normal caliber ureteral meatus.  Procedure: The flexible cystoscope was introduced without difficulty - No urethral strictures/lesions are present. - Enlarged prostate trilobar coaptation - Normal bladder neck - Bilateral ureteral orifices identified - Bladder mucosa  reveals no ulcers, tumors, or lesions - No bladder stones - Mild trabeculation  Retroflexion shows well-circumscribed median lobe   Post-Procedure: - Patient tolerated the procedure well  Assessment/ Plan:  1. Bladder mass Bladder mass in question represents median lobe of the prostate, no evidence of bladder cancer which is reassuring  He does have some sequela of outlet obstruction with mild trabeculation.  If he develops urinary symptoms, would benefit from HoLEP versus TURP for his prostate anatomy.  Follow-up as needed    Hollice Espy, MD

## 2021-11-07 ENCOUNTER — Inpatient Hospital Stay: Payer: Medicare Other | Admitting: Oncology

## 2021-11-07 DIAGNOSIS — N189 Chronic kidney disease, unspecified: Secondary | ICD-10-CM | POA: Diagnosis not present

## 2021-11-07 DIAGNOSIS — Z0000 Encounter for general adult medical examination without abnormal findings: Secondary | ICD-10-CM | POA: Diagnosis not present

## 2021-11-07 DIAGNOSIS — N1831 Chronic kidney disease, stage 3a: Secondary | ICD-10-CM | POA: Insufficient documentation

## 2021-11-07 DIAGNOSIS — Z1159 Encounter for screening for other viral diseases: Secondary | ICD-10-CM | POA: Diagnosis not present

## 2021-11-07 DIAGNOSIS — Z1389 Encounter for screening for other disorder: Secondary | ICD-10-CM | POA: Diagnosis not present

## 2021-11-08 ENCOUNTER — Telehealth: Payer: Self-pay

## 2021-11-08 DIAGNOSIS — R7881 Bacteremia: Secondary | ICD-10-CM

## 2021-11-08 DIAGNOSIS — B9561 Methicillin susceptible Staphylococcus aureus infection as the cause of diseases classified elsewhere: Secondary | ICD-10-CM

## 2021-11-08 HISTORY — DX: Bacteremia: R78.81

## 2021-11-08 HISTORY — DX: Methicillin susceptible Staphylococcus aureus infection as the cause of diseases classified elsewhere: B95.61

## 2021-11-08 NOTE — Telephone Encounter (Signed)
Erroneous encounter, please disregard.  Jeral Fruit, RN 11/08/21 10:13 AM

## 2021-11-12 ENCOUNTER — Encounter
Admission: RE | Admit: 2021-11-12 | Discharge: 2021-11-12 | Disposition: A | Discharge status: Home / Self Care | Payer: Medicare Other | Source: Ambulatory Visit | Attending: Oncology | Admitting: Oncology

## 2021-11-12 DIAGNOSIS — K5790 Diverticulosis of intestine, part unspecified, without perforation or abscess without bleeding: Secondary | ICD-10-CM | POA: Diagnosis not present

## 2021-11-12 DIAGNOSIS — E878 Other disorders of electrolyte and fluid balance, not elsewhere classified: Secondary | ICD-10-CM | POA: Diagnosis not present

## 2021-11-12 DIAGNOSIS — I700 Atherosclerosis of aorta: Secondary | ICD-10-CM | POA: Diagnosis not present

## 2021-11-12 DIAGNOSIS — A4101 Sepsis due to Methicillin susceptible Staphylococcus aureus: Secondary | ICD-10-CM | POA: Diagnosis not present

## 2021-11-12 DIAGNOSIS — I5032 Chronic diastolic (congestive) heart failure: Secondary | ICD-10-CM | POA: Diagnosis not present

## 2021-11-12 DIAGNOSIS — R509 Fever, unspecified: Secondary | ICD-10-CM | POA: Diagnosis not present

## 2021-11-12 DIAGNOSIS — E860 Dehydration: Secondary | ICD-10-CM | POA: Diagnosis not present

## 2021-11-12 DIAGNOSIS — N179 Acute kidney failure, unspecified: Secondary | ICD-10-CM | POA: Diagnosis not present

## 2021-11-12 DIAGNOSIS — M6282 Rhabdomyolysis: Secondary | ICD-10-CM | POA: Diagnosis not present

## 2021-11-12 DIAGNOSIS — K229 Disease of esophagus, unspecified: Secondary | ICD-10-CM | POA: Insufficient documentation

## 2021-11-12 DIAGNOSIS — E785 Hyperlipidemia, unspecified: Secondary | ICD-10-CM | POA: Diagnosis not present

## 2021-11-12 DIAGNOSIS — G319 Degenerative disease of nervous system, unspecified: Secondary | ICD-10-CM | POA: Diagnosis not present

## 2021-11-12 DIAGNOSIS — H9190 Unspecified hearing loss, unspecified ear: Secondary | ICD-10-CM | POA: Diagnosis not present

## 2021-11-12 DIAGNOSIS — R531 Weakness: Secondary | ICD-10-CM | POA: Diagnosis not present

## 2021-11-12 DIAGNOSIS — K224 Dyskinesia of esophagus: Secondary | ICD-10-CM | POA: Diagnosis not present

## 2021-11-12 DIAGNOSIS — M4802 Spinal stenosis, cervical region: Secondary | ICD-10-CM | POA: Diagnosis not present

## 2021-11-12 DIAGNOSIS — Z66 Do not resuscitate: Secondary | ICD-10-CM | POA: Diagnosis not present

## 2021-11-12 DIAGNOSIS — E871 Hypo-osmolality and hyponatremia: Secondary | ICD-10-CM | POA: Diagnosis not present

## 2021-11-12 DIAGNOSIS — S0990XA Unspecified injury of head, initial encounter: Secondary | ICD-10-CM | POA: Diagnosis not present

## 2021-11-12 DIAGNOSIS — S199XXA Unspecified injury of neck, initial encounter: Secondary | ICD-10-CM | POA: Diagnosis not present

## 2021-11-12 DIAGNOSIS — R000 Tachycardia, unspecified: Secondary | ICD-10-CM | POA: Diagnosis not present

## 2021-11-12 DIAGNOSIS — K2980 Duodenitis without bleeding: Secondary | ICD-10-CM | POA: Diagnosis not present

## 2021-11-12 DIAGNOSIS — R6520 Severe sepsis without septic shock: Secondary | ICD-10-CM | POA: Diagnosis not present

## 2021-11-12 DIAGNOSIS — K269 Duodenal ulcer, unspecified as acute or chronic, without hemorrhage or perforation: Secondary | ICD-10-CM | POA: Diagnosis not present

## 2021-11-12 DIAGNOSIS — K2100 Gastro-esophageal reflux disease with esophagitis, without bleeding: Secondary | ICD-10-CM | POA: Diagnosis not present

## 2021-11-12 DIAGNOSIS — K3189 Other diseases of stomach and duodenum: Secondary | ICD-10-CM | POA: Diagnosis not present

## 2021-11-12 DIAGNOSIS — N289 Disorder of kidney and ureter, unspecified: Secondary | ICD-10-CM | POA: Diagnosis not present

## 2021-11-12 DIAGNOSIS — R9082 White matter disease, unspecified: Secondary | ICD-10-CM | POA: Diagnosis not present

## 2021-11-12 DIAGNOSIS — R066 Hiccough: Secondary | ICD-10-CM | POA: Diagnosis not present

## 2021-11-12 DIAGNOSIS — Z20822 Contact with and (suspected) exposure to covid-19: Secondary | ICD-10-CM | POA: Diagnosis not present

## 2021-11-12 DIAGNOSIS — I110 Hypertensive heart disease with heart failure: Secondary | ICD-10-CM | POA: Diagnosis not present

## 2021-11-12 DIAGNOSIS — E876 Hypokalemia: Secondary | ICD-10-CM | POA: Diagnosis not present

## 2021-11-12 LAB — GLUCOSE, CAPILLARY: Glucose-Capillary: 104 mg/dL — ABNORMAL HIGH (ref 70–99)

## 2021-11-12 MED ORDER — FLUDEOXYGLUCOSE F - 18 (FDG) INJECTION
11.5000 | Freq: Once | INTRAVENOUS | Status: AC | PRN
  Administered 2021-11-12: 12.63 via INTRAVENOUS

## 2021-11-15 ENCOUNTER — Emergency Department: Payer: Medicare Other

## 2021-11-15 ENCOUNTER — Other Ambulatory Visit: Payer: Self-pay

## 2021-11-15 ENCOUNTER — Inpatient Hospital Stay
Admission: EM | Admit: 2021-11-15 | Discharge: 2021-11-20 | DRG: 872 | Disposition: A | Discharge status: Skilled Nursing Facility | Payer: Medicare Other | Attending: Internal Medicine | Admitting: Internal Medicine

## 2021-11-15 DIAGNOSIS — A4101 Sepsis due to Methicillin susceptible Staphylococcus aureus: Principal | ICD-10-CM | POA: Diagnosis present

## 2021-11-15 DIAGNOSIS — Z20822 Contact with and (suspected) exposure to covid-19: Secondary | ICD-10-CM | POA: Diagnosis present

## 2021-11-15 DIAGNOSIS — M0008 Staphylococcal arthritis, vertebrae: Secondary | ICD-10-CM | POA: Diagnosis not present

## 2021-11-15 DIAGNOSIS — R066 Hiccough: Secondary | ICD-10-CM | POA: Diagnosis not present

## 2021-11-15 DIAGNOSIS — K2980 Duodenitis without bleeding: Secondary | ICD-10-CM | POA: Diagnosis present

## 2021-11-15 DIAGNOSIS — W19XXXA Unspecified fall, initial encounter: Secondary | ICD-10-CM | POA: Diagnosis present

## 2021-11-15 DIAGNOSIS — E876 Hypokalemia: Secondary | ICD-10-CM | POA: Diagnosis present

## 2021-11-15 DIAGNOSIS — I700 Atherosclerosis of aorta: Secondary | ICD-10-CM | POA: Diagnosis present

## 2021-11-15 DIAGNOSIS — K269 Duodenal ulcer, unspecified as acute or chronic, without hemorrhage or perforation: Secondary | ICD-10-CM | POA: Diagnosis present

## 2021-11-15 DIAGNOSIS — I5032 Chronic diastolic (congestive) heart failure: Secondary | ICD-10-CM | POA: Diagnosis present

## 2021-11-15 DIAGNOSIS — N179 Acute kidney failure, unspecified: Secondary | ICD-10-CM | POA: Diagnosis present

## 2021-11-15 DIAGNOSIS — E860 Dehydration: Secondary | ICD-10-CM | POA: Diagnosis present

## 2021-11-15 DIAGNOSIS — M0080 Arthritis due to other bacteria, unspecified joint: Secondary | ICD-10-CM | POA: Diagnosis not present

## 2021-11-15 DIAGNOSIS — R509 Fever, unspecified: Secondary | ICD-10-CM | POA: Diagnosis not present

## 2021-11-15 DIAGNOSIS — N289 Disorder of kidney and ureter, unspecified: Secondary | ICD-10-CM

## 2021-11-15 DIAGNOSIS — Z736 Limitation of activities due to disability: Secondary | ICD-10-CM | POA: Diagnosis not present

## 2021-11-15 DIAGNOSIS — I10 Essential (primary) hypertension: Secondary | ICD-10-CM | POA: Diagnosis not present

## 2021-11-15 DIAGNOSIS — R6520 Severe sepsis without septic shock: Secondary | ICD-10-CM | POA: Diagnosis present

## 2021-11-15 DIAGNOSIS — E785 Hyperlipidemia, unspecified: Secondary | ICD-10-CM | POA: Diagnosis present

## 2021-11-15 DIAGNOSIS — S0990XA Unspecified injury of head, initial encounter: Secondary | ICD-10-CM | POA: Diagnosis not present

## 2021-11-15 DIAGNOSIS — K2100 Gastro-esophageal reflux disease with esophagitis, without bleeding: Secondary | ICD-10-CM | POA: Diagnosis present

## 2021-11-15 DIAGNOSIS — M5000 Cervical disc disorder with myelopathy, unspecified cervical region: Secondary | ICD-10-CM | POA: Diagnosis not present

## 2021-11-15 DIAGNOSIS — I110 Hypertensive heart disease with heart failure: Secondary | ICD-10-CM | POA: Diagnosis present

## 2021-11-15 DIAGNOSIS — E669 Obesity, unspecified: Secondary | ICD-10-CM | POA: Diagnosis present

## 2021-11-15 DIAGNOSIS — S199XXA Unspecified injury of neck, initial encounter: Secondary | ICD-10-CM | POA: Diagnosis not present

## 2021-11-15 DIAGNOSIS — R7881 Bacteremia: Secondary | ICD-10-CM | POA: Insufficient documentation

## 2021-11-15 DIAGNOSIS — E871 Hypo-osmolality and hyponatremia: Secondary | ICD-10-CM | POA: Diagnosis not present

## 2021-11-15 DIAGNOSIS — E569 Vitamin deficiency, unspecified: Secondary | ICD-10-CM | POA: Diagnosis not present

## 2021-11-15 DIAGNOSIS — K219 Gastro-esophageal reflux disease without esophagitis: Secondary | ICD-10-CM | POA: Diagnosis present

## 2021-11-15 DIAGNOSIS — K224 Dyskinesia of esophagus: Secondary | ICD-10-CM | POA: Diagnosis present

## 2021-11-15 DIAGNOSIS — M6259 Muscle wasting and atrophy, not elsewhere classified, multiple sites: Secondary | ICD-10-CM | POA: Diagnosis not present

## 2021-11-15 DIAGNOSIS — E878 Other disorders of electrolyte and fluid balance, not elsewhere classified: Secondary | ICD-10-CM | POA: Diagnosis present

## 2021-11-15 DIAGNOSIS — M6282 Rhabdomyolysis: Secondary | ICD-10-CM

## 2021-11-15 DIAGNOSIS — Z66 Do not resuscitate: Secondary | ICD-10-CM | POA: Diagnosis present

## 2021-11-15 DIAGNOSIS — G8929 Other chronic pain: Secondary | ICD-10-CM | POA: Diagnosis present

## 2021-11-15 DIAGNOSIS — I499 Cardiac arrhythmia, unspecified: Secondary | ICD-10-CM | POA: Diagnosis not present

## 2021-11-15 DIAGNOSIS — B9561 Methicillin susceptible Staphylococcus aureus infection as the cause of diseases classified elsewhere: Secondary | ICD-10-CM | POA: Insufficient documentation

## 2021-11-15 DIAGNOSIS — Z87891 Personal history of nicotine dependence: Secondary | ICD-10-CM

## 2021-11-15 DIAGNOSIS — H9190 Unspecified hearing loss, unspecified ear: Secondary | ICD-10-CM | POA: Diagnosis present

## 2021-11-15 DIAGNOSIS — K3189 Other diseases of stomach and duodenum: Secondary | ICD-10-CM | POA: Diagnosis present

## 2021-11-15 DIAGNOSIS — R000 Tachycardia, unspecified: Secondary | ICD-10-CM | POA: Diagnosis not present

## 2021-11-15 DIAGNOSIS — M545 Low back pain, unspecified: Secondary | ICD-10-CM | POA: Diagnosis not present

## 2021-11-15 DIAGNOSIS — M4802 Spinal stenosis, cervical region: Secondary | ICD-10-CM | POA: Diagnosis not present

## 2021-11-15 DIAGNOSIS — Z6827 Body mass index (BMI) 27.0-27.9, adult: Secondary | ICD-10-CM

## 2021-11-15 DIAGNOSIS — G319 Degenerative disease of nervous system, unspecified: Secondary | ICD-10-CM | POA: Diagnosis not present

## 2021-11-15 DIAGNOSIS — R0689 Other abnormalities of breathing: Secondary | ICD-10-CM | POA: Diagnosis not present

## 2021-11-15 DIAGNOSIS — Z79899 Other long term (current) drug therapy: Secondary | ICD-10-CM

## 2021-11-15 DIAGNOSIS — W19XXXD Unspecified fall, subsequent encounter: Secondary | ICD-10-CM | POA: Diagnosis not present

## 2021-11-15 DIAGNOSIS — Z8249 Family history of ischemic heart disease and other diseases of the circulatory system: Secondary | ICD-10-CM

## 2021-11-15 DIAGNOSIS — Z888 Allergy status to other drugs, medicaments and biological substances status: Secondary | ICD-10-CM

## 2021-11-15 DIAGNOSIS — R531 Weakness: Secondary | ICD-10-CM | POA: Diagnosis not present

## 2021-11-15 DIAGNOSIS — N400 Enlarged prostate without lower urinary tract symptoms: Secondary | ICD-10-CM | POA: Diagnosis present

## 2021-11-15 DIAGNOSIS — I330 Acute and subacute infective endocarditis: Secondary | ICD-10-CM | POA: Diagnosis not present

## 2021-11-15 DIAGNOSIS — R5383 Other fatigue: Secondary | ICD-10-CM | POA: Diagnosis not present

## 2021-11-15 DIAGNOSIS — R9082 White matter disease, unspecified: Secondary | ICD-10-CM | POA: Diagnosis not present

## 2021-11-15 DIAGNOSIS — Z7982 Long term (current) use of aspirin: Secondary | ICD-10-CM

## 2021-11-15 LAB — URINALYSIS, COMPLETE (UACMP) WITH MICROSCOPIC
Bilirubin Urine: NEGATIVE
Glucose, UA: 50 mg/dL — AB
Ketones, ur: 5 mg/dL — AB
Nitrite: NEGATIVE
Protein, ur: 100 mg/dL — AB
Specific Gravity, Urine: 1.019 (ref 1.005–1.030)
pH: 5 (ref 5.0–8.0)

## 2021-11-15 LAB — CBC WITH DIFFERENTIAL/PLATELET
Abs Immature Granulocytes: 0.15 10*3/uL — ABNORMAL HIGH (ref 0.00–0.07)
Basophils Absolute: 0 10*3/uL (ref 0.0–0.1)
Basophils Relative: 0 %
Eosinophils Absolute: 0 10*3/uL (ref 0.0–0.5)
Eosinophils Relative: 0 %
HCT: 49.6 % (ref 39.0–52.0)
Hemoglobin: 17 g/dL (ref 13.0–17.0)
Immature Granulocytes: 1 %
Lymphocytes Relative: 2 %
Lymphs Abs: 0.2 10*3/uL — ABNORMAL LOW (ref 0.7–4.0)
MCH: 32.3 pg (ref 26.0–34.0)
MCHC: 34.3 g/dL (ref 30.0–36.0)
MCV: 94.3 fL (ref 80.0–100.0)
Monocytes Absolute: 0.6 10*3/uL (ref 0.1–1.0)
Monocytes Relative: 5 %
Neutro Abs: 12.2 10*3/uL — ABNORMAL HIGH (ref 1.7–7.7)
Neutrophils Relative %: 92 %
Platelets: 186 10*3/uL (ref 150–400)
RBC: 5.26 MIL/uL (ref 4.22–5.81)
RDW: 13.4 % (ref 11.5–15.5)
WBC: 13.2 10*3/uL — ABNORMAL HIGH (ref 4.0–10.5)
nRBC: 0 % (ref 0.0–0.2)

## 2021-11-15 LAB — SARS CORONAVIRUS 2 BY RT PCR: SARS Coronavirus 2 by RT PCR: NEGATIVE

## 2021-11-15 LAB — TROPONIN I (HIGH SENSITIVITY)
Troponin I (High Sensitivity): 34 ng/L — ABNORMAL HIGH (ref <18)
Troponin I (High Sensitivity): 30 ng/L — ABNORMAL HIGH (ref <18)

## 2021-11-15 MED ORDER — MORPHINE SULFATE (PF) 4 MG/ML IV SOLN
4.0000 mg | Freq: Once | INTRAVENOUS | Status: AC
  Administered 2021-11-15: 4 mg via INTRAVENOUS
  Filled 2021-11-15: qty 1

## 2021-11-15 MED ORDER — MAGNESIUM HYDROXIDE 400 MG/5ML PO SUSP: 30.0000 mL | Freq: Every day | ORAL | Status: DC | PRN

## 2021-11-15 MED ORDER — TRAZODONE HCL 50 MG PO TABS: 25.0000 mg | ORAL_TABLET | Freq: Every evening | ORAL | Status: DC | PRN

## 2021-11-15 MED ORDER — OXYCODONE-ACETAMINOPHEN 5-325 MG PO TABS
1.0000 | ORAL_TABLET | Freq: Once | ORAL | Status: AC
  Administered 2021-11-15: 1 via ORAL
  Filled 2021-11-15: qty 1

## 2021-11-15 MED ORDER — ONDANSETRON HCL 4 MG PO TABS: 4.0000 mg | ORAL_TABLET | Freq: Four times a day (QID) | ORAL | Status: DC | PRN

## 2021-11-15 MED ORDER — ACETAMINOPHEN 650 MG RE SUPP: 650.0000 mg | Freq: Four times a day (QID) | RECTAL | Status: DC | PRN

## 2021-11-15 MED ORDER — SODIUM CHLORIDE 0.9 % IV BOLUS
1000.0000 mL | Freq: Once | INTRAVENOUS | Status: AC
  Administered 2021-11-15: 1000 mL via INTRAVENOUS

## 2021-11-15 MED ORDER — VITAMIN B-12 1000 MCG PO TABS
1000.0000 ug | ORAL_TABLET | Freq: Every day | ORAL | Status: DC
  Administered 2021-11-16 – 2021-11-20 (×5): 1000 ug via ORAL
  Filled 2021-11-15 (×5): qty 1

## 2021-11-15 MED ORDER — PANTOPRAZOLE SODIUM 40 MG PO TBEC
40.0000 mg | DELAYED_RELEASE_TABLET | Freq: Every day | ORAL | Status: DC
  Administered 2021-11-16 – 2021-11-20 (×5): 40 mg via ORAL
  Filled 2021-11-15 (×5): qty 1

## 2021-11-15 MED ORDER — ACETAMINOPHEN 325 MG PO TABS: 650.0000 mg | ORAL_TABLET | Freq: Four times a day (QID) | ORAL | Status: DC | PRN

## 2021-11-15 MED ORDER — ACETAMINOPHEN 325 MG RE SUPP: 650.0000 mg | Freq: Four times a day (QID) | RECTAL | Status: DC | PRN

## 2021-11-15 MED ORDER — TRAZODONE HCL 50 MG PO TABS
25.0000 mg | ORAL_TABLET | Freq: Every evening | ORAL | Status: DC | PRN
  Administered 2021-11-16: 25 mg via ORAL
  Filled 2021-11-15: qty 1

## 2021-11-15 MED ORDER — SODIUM CHLORIDE 0.9 % IV SOLN: INTRAVENOUS | Status: DC

## 2021-11-15 MED ORDER — ENOXAPARIN SODIUM 40 MG/0.4ML IJ SOSY: 40.0000 mg | PREFILLED_SYRINGE | INTRAMUSCULAR | Status: DC

## 2021-11-15 MED ORDER — ONDANSETRON HCL 4 MG/2ML IJ SOLN: 4.0000 mg | Freq: Four times a day (QID) | INTRAMUSCULAR | Status: DC | PRN

## 2021-11-15 MED ORDER — BACLOFEN 10 MG PO TABS
10.0000 mg | ORAL_TABLET | Freq: Two times a day (BID) | ORAL | Status: DC
  Administered 2021-11-16 – 2021-11-20 (×10): 10 mg via ORAL
  Filled 2021-11-15 (×11): qty 1

## 2021-11-15 MED ORDER — ADULT MULTIVITAMIN W/MINERALS CH
1.0000 | ORAL_TABLET | Freq: Every day | ORAL | Status: DC
  Administered 2021-11-16 – 2021-11-20 (×5): 1 via ORAL
  Filled 2021-11-15 (×5): qty 1

## 2021-11-15 MED ORDER — VITAMIN D (ERGOCALCIFEROL) 1.25 MG (50000 UNIT) PO CAPS
50000.0000 [IU] | ORAL_CAPSULE | ORAL | Status: DC
  Filled 2021-11-15 (×2): qty 1

## 2021-11-15 MED ORDER — ACETAMINOPHEN 325 MG PO TABS
650.0000 mg | ORAL_TABLET | Freq: Four times a day (QID) | ORAL | Status: DC | PRN
  Administered 2021-11-16: 650 mg via ORAL
  Filled 2021-11-15: qty 2

## 2021-11-15 MED ORDER — ASPIRIN 81 MG PO TBEC
81.0000 mg | DELAYED_RELEASE_TABLET | Freq: Every day | ORAL | Status: DC
  Administered 2021-11-16 – 2021-11-20 (×5): 81 mg via ORAL
  Filled 2021-11-15 (×5): qty 1

## 2021-11-15 MED ORDER — ENOXAPARIN SODIUM 60 MG/0.6ML IJ SOSY
0.5000 mg/kg | PREFILLED_SYRINGE | INTRAMUSCULAR | Status: DC
  Administered 2021-11-16 – 2021-11-20 (×5): 50 mg via SUBCUTANEOUS
  Filled 2021-11-15 (×6): qty 0.6

## 2021-11-15 MED ORDER — POTASSIUM CHLORIDE CRYS ER 20 MEQ PO TBCR
40.0000 meq | EXTENDED_RELEASE_TABLET | Freq: Once | ORAL | Status: AC
  Administered 2021-11-15: 40 meq via ORAL
  Filled 2021-11-15: qty 2

## 2021-11-15 MED ORDER — SODIUM CHLORIDE 0.9 % IV BOLUS: 1000.0000 mL | Freq: Once | INTRAVENOUS | Status: DC

## 2021-11-15 MED ORDER — SACCHAROMYCES BOULARDII 250 MG PO CAPS
250.0000 mg | ORAL_CAPSULE | Freq: Two times a day (BID) | ORAL | Status: DC
  Administered 2021-11-16 – 2021-11-20 (×9): 250 mg via ORAL
  Filled 2021-11-15 (×10): qty 1

## 2021-11-15 MED ORDER — POTASSIUM CHLORIDE IN NACL 20-0.9 MEQ/L-% IV SOLN
INTRAVENOUS | Status: DC
  Filled 2021-11-15 (×3): qty 1000

## 2021-11-15 MED ORDER — ENSURE ENLIVE PO LIQD: 237.0000 mL | Freq: Three times a day (TID) | ORAL | Status: DC

## 2021-11-15 NOTE — ED Triage Notes (Addendum)
Pt comes via EMS with c/o trip and fall at home while going to the bathroom. Pt denies any loc or hitting head.   VSS

## 2021-11-15 NOTE — ED Provider Notes (Signed)
[facility] Provider Note    None    (approximate)  History   Chief Complaint: Fall  HPI  John Booker is a 71 y.o. male with a past medical history of hypertension presents emergency department for weakness and a fall.  According to the patient since this morning he has been feeling very weak, states he had a fall due to weakness.  Denies hitting his head denies LOC.  Patient is not sure if he takes blood thinners.  Patient states he was on the ground from around 8:00 until 4 PM due to significant weakness.  Patient states he has not fallen in a very long time and this is not typical for him.  Patient found to be tachycardic around 130 bpm slightly tachypneic as well.  Denies any chest pain or shortness of breath.  Denies abdominal pain.  Denies any recent nausea vomiting diarrhea or dysuria.  No cough or fever.  Physical Exam   Triage Vital Signs: ED Triage Vitals  Enc Vitals Group     BP 11/15/21 1753 (!) 136/106     Pulse Rate 11/15/21 1753 (!) 135     Resp 11/15/21 1753 (!) 22     Temp 11/15/21 1753 98.9 F (37.2 C)     Temp src --      SpO2 11/15/21 1753 94 %     Weight 11/15/21 1751 221 lb (100.2 kg)     Height --      Head Circumference --      Peak Flow --      Pain Score 11/15/21 1750 5     Pain Loc --      Pain Edu? --      Excl. in [place]? --     Most recent vital signs: Vitals:   11/15/21 1753  BP: (!) 136/106  Pulse: (!) 135  Resp: (!) 22  Temp: 98.9 F (37.2 C)  SpO2: 94%    General: Awake, no distress.  CV:  Good peripheral perfusion.  Regular rate and rhythm  Resp:  Normal effort.  Equal breath sounds bilaterally.  Abd:  No distention.  Soft, nontender.  No rebound or guarding.   ED Results / Procedures / Treatments   EKG  EKG viewed and interpreted by myself shows what appears to be sinus tachycardia 136 bpm with a narrow QRS, normal axis, normal intervals, nonspecific ST changes.  RADIOLOGY  I have reviewed and  interpreted the chest x-ray findings.  No significant abnormality seen on my evaluation. Radiology is read the chest x-ray as negative.   MEDICATIONS ORDERED IN ED: Medications  sodium chloride 0.9 % bolus 1,000 mL (has no administration in time range)     IMPRESSION / MDM / ASSESSMENT AND PLAN / ED COURSE  I reviewed the triage vital signs and the nursing notes.  Patient's presentation is most consistent with acute presentation with potential threat to life or bodily function.  Patient presents emergency department for generalized weakness.  Patient also had a fall today was on the ground for approximately 8 hours due to weakness.  Patient states he felt well yesterday and the weakness just started today.  Differential is quite broad but would include infectious etiology such as urinary tract infection COVID, pneumonia.  Given the fall patient is complaining some mild neck pain patient is in a c-collar we will obtain CT imaging of the head and C-spine as a precaution.  We will check labs to evaluate for  any possible rhabdomyolysis renal dysfunction electrolyte or metabolic dysfunction or anemia.  We will IV hydrate while awaiting results.  Patient agreeable to plan of care.  Patient's labs have resulted showing significant CK elevation greater than 3500 consistent with rhabdomyolysis.  Patient does have renal insufficiency as well baseline creatinine around 1.0 currently greater than 2.0.  Patient receiving IV fluids.  CT imaging does not appear to show any significant abnormality.  We will admit to the hospital service for weakness rhabdomyolysis and acute renal insufficiency.  Patient agreeable to plan of care.  Remainder the lab work shows slight leukocytosis otherwise reassuring CBC mild troponin elevation at 30 likely related to renal insufficiency, largely unchanged after 2 hours.  Urinalysis shows no significant findings.  FINAL CLINICAL IMPRESSION(S) / ED DIAGNOSES    Fall Weakness Rhabdomyolysis Acute renal insufficiency  Note:  This document was prepared using Dragon voice recognition software and may include unintentional dictation errors.   Harvest Dark, MD 11/15/21 2325

## 2021-11-15 NOTE — ED Triage Notes (Signed)
Pt arrives with c/o fall in his bathroom. Pt c/o neck pain also.

## 2021-11-15 NOTE — Progress Notes (Signed)
PHARMACIST - PHYSICIAN COMMUNICATION  CONCERNING:  Enoxaparin (Lovenox) for DVT Prophylaxis    RECOMMENDATION: Patient was prescribed enoxaprin '40mg'$  q24 hours for VTE prophylaxis.   Filed Weights   11/15/21 1751  Weight: 100.2 kg (221 lb)    Body mass index is 27.62 kg/m.  Estimated Creatinine Clearance: 35.8 mL/min (A) (by C-G formula based on SCr of 2.23 mg/dL (H)).   Based on [place] patient is candidate for enoxaparin 0.'5mg'$ /kg TBW SQ every 24 hours based on BMI being >30.  DESCRIPTION: Pharmacy has adjusted enoxaparin dose per [facility] policy.  Patient is now receiving enoxaparin 0.5 mg/kg every 24 hours   Renda Rolls, PharmD, [facility] 11/15/2021 11:15 PM

## 2021-11-16 DIAGNOSIS — R509 Fever, unspecified: Secondary | ICD-10-CM

## 2021-11-16 DIAGNOSIS — B9561 Methicillin susceptible Staphylococcus aureus infection as the cause of diseases classified elsewhere: Secondary | ICD-10-CM | POA: Insufficient documentation

## 2021-11-16 DIAGNOSIS — N179 Acute kidney failure, unspecified: Secondary | ICD-10-CM | POA: Diagnosis not present

## 2021-11-16 DIAGNOSIS — M6282 Rhabdomyolysis: Secondary | ICD-10-CM | POA: Diagnosis not present

## 2021-11-16 DIAGNOSIS — K219 Gastro-esophageal reflux disease without esophagitis: Secondary | ICD-10-CM | POA: Diagnosis present

## 2021-11-16 DIAGNOSIS — E876 Hypokalemia: Secondary | ICD-10-CM | POA: Diagnosis present

## 2021-11-16 DIAGNOSIS — E871 Hypo-osmolality and hyponatremia: Secondary | ICD-10-CM

## 2021-11-16 LAB — CBC
HCT: 47 % (ref 39.0–52.0)
Hemoglobin: 15.9 g/dL (ref 13.0–17.0)
MCH: 32.2 pg (ref 26.0–34.0)
MCHC: 33.8 g/dL (ref 30.0–36.0)
MCV: 95.1 fL (ref 80.0–100.0)
Platelets: 150 10*3/uL (ref 150–400)
RBC: 4.94 MIL/uL (ref 4.22–5.81)
RDW: 13.7 % (ref 11.5–15.5)
WBC: 8.6 10*3/uL (ref 4.0–10.5)
nRBC: 0 % (ref 0.0–0.2)

## 2021-11-16 LAB — COMPREHENSIVE METABOLIC PANEL
ALT: 27 U/L (ref 0–44)
AST: 98 U/L — ABNORMAL HIGH (ref 15–41)
Albumin: 3.6 g/dL (ref 3.5–5.0)
Alkaline Phosphatase: 65 U/L (ref 38–126)
Anion gap: 17 — ABNORMAL HIGH (ref 5–15)
BUN: 29 mg/dL — ABNORMAL HIGH (ref 8–23)
CO2: 26 mmol/L (ref 22–32)
Calcium: 8.6 mg/dL — ABNORMAL LOW (ref 8.9–10.3)
Chloride: 86 mmol/L — ABNORMAL LOW (ref 98–111)
Creatinine, Ser: 2.23 mg/dL — ABNORMAL HIGH (ref 0.61–1.24)
GFR, Estimated: 31 mL/min — ABNORMAL LOW (ref >60)
Glucose, Bld: 146 mg/dL — ABNORMAL HIGH (ref 70–99)
Potassium: 2.9 mmol/L — ABNORMAL LOW (ref 3.5–5.1)
Sodium: 129 mmol/L — ABNORMAL LOW (ref 135–145)
Total Bilirubin: 2.3 mg/dL — ABNORMAL HIGH (ref 0.3–1.2)
Total Protein: 6.7 g/dL (ref 6.5–8.1)

## 2021-11-16 LAB — BASIC METABOLIC PANEL
Anion gap: 11 (ref 5–15)
BUN: 31 mg/dL — ABNORMAL HIGH (ref 8–23)
CO2: 29 mmol/L (ref 22–32)
Calcium: 7.8 mg/dL — ABNORMAL LOW (ref 8.9–10.3)
Chloride: 91 mmol/L — ABNORMAL LOW (ref 98–111)
Creatinine, Ser: 1.81 mg/dL — ABNORMAL HIGH (ref 0.61–1.24)
GFR, Estimated: 39 mL/min — ABNORMAL LOW (ref >60)
Glucose, Bld: 124 mg/dL — ABNORMAL HIGH (ref 70–99)
Potassium: 3.1 mmol/L — ABNORMAL LOW (ref 3.5–5.1)
Sodium: 131 mmol/L — ABNORMAL LOW (ref 135–145)

## 2021-11-16 LAB — MAGNESIUM: Magnesium: 1.6 mg/dL — ABNORMAL LOW (ref 1.7–2.4)

## 2021-11-16 LAB — PROCALCITONIN: Procalcitonin: 12.93 ng/mL

## 2021-11-16 LAB — CK
Total CK: 3521 U/L — ABNORMAL HIGH (ref 49–397)
Total CK: 6455 U/L — ABNORMAL HIGH (ref 49–397)

## 2021-11-16 MED ORDER — MAGNESIUM SULFATE 2 GM/50ML IV SOLN
2.0000 g | Freq: Once | INTRAVENOUS | Status: AC
  Administered 2021-11-16: 2 g via INTRAVENOUS
  Filled 2021-11-16: qty 50

## 2021-11-16 MED ORDER — SODIUM CHLORIDE 0.9 % IV SOLN
12.5000 mg | Freq: Four times a day (QID) | INTRAVENOUS | Status: DC | PRN
  Administered 2021-11-16: 12.5 mg via INTRAVENOUS
  Filled 2021-11-16: qty 0.5

## 2021-11-16 MED ORDER — MORPHINE SULFATE (PF) 2 MG/ML IV SOLN
2.0000 mg | INTRAVENOUS | Status: DC | PRN
  Administered 2021-11-16 – 2021-11-20 (×7): 2 mg via INTRAVENOUS
  Filled 2021-11-16 (×7): qty 1

## 2021-11-16 MED ORDER — ORAL CARE MOUTH RINSE: 15.0000 mL | OROMUCOSAL | Status: DC | PRN

## 2021-11-16 MED ORDER — SODIUM CHLORIDE 0.9 % IV SOLN
2.0000 g | INTRAVENOUS | Status: DC
  Administered 2021-11-16: 2 g via INTRAVENOUS
  Filled 2021-11-16 (×2): qty 20

## 2021-11-16 MED ORDER — POTASSIUM CHLORIDE CRYS ER 20 MEQ PO TBCR
40.0000 meq | EXTENDED_RELEASE_TABLET | Freq: Once | ORAL | Status: AC
  Administered 2021-11-16: 40 meq via ORAL
  Filled 2021-11-16: qty 2

## 2021-11-16 MED ORDER — LABETALOL HCL 5 MG/ML IV SOLN
10.0000 mg | INTRAVENOUS | Status: DC | PRN
  Filled 2021-11-16: qty 4

## 2021-11-16 NOTE — Progress Notes (Signed)
   11/16/21 1539  Assess: MEWS Score  Temp (!) 101.1 F (38.4 C)  BP (!) 179/81  MAP (mmHg) 107  Pulse Rate (!) 120  Resp 18  SpO2 94 %  O2 Device Room Air  Assess: MEWS Score  MEWS Temp 1  MEWS Systolic 0  MEWS Pulse 2  MEWS RR 0  MEWS LOC 0  MEWS Score 3  MEWS Score Color Yellow  Assess: if the MEWS score is Yellow or Red  Were vital signs taken at a resting state? Yes  Focused Assessment No change from prior assessment  Does the patient meet 2 or more of the SIRS criteria? No  MEWS guidelines implemented *See Row Information* Yes  Take Vital Signs  Increase Vital Sign Frequency  Yellow: Q 2hr X 2 then Q 4hr X 2, if remains yellow, continue Q 4hrs  Escalate  MEWS: Escalate Yellow: discuss with charge nurse/RN and consider discussing with provider and RRT  Notify: Charge Nurse/RN  Name of Charge Nurse/RN Notified Otis Peak, Therapist, sports.  Date Charge Nurse/RN Notified 11/16/21  Time Charge Nurse/RN Notified 1545  Notify: Provider  Date Provider Notified 11/16/21  Time Provider Notified 1541  Method of Notification Page  Notification Reason Other (Comment) (Abnl vitals and yellow MEWS status)  Provider response See new orders  Date of Provider Response 11/16/21  Assess: SIRS CRITERIA  SIRS Temperature  1  SIRS Pulse 1  SIRS Respirations  0  SIRS WBC 1  SIRS Score Sum  3

## 2021-11-16 NOTE — Progress Notes (Signed)
Progress Note   Patient: John Booker YQM:[id number] DOB: [birth year] DOA: 11/15/2021     1 DOS: the patient was seen and examined on 11/16/2021   Brief hospital course: Taken from H&P.  John Booker is a 71 y.o. male with medical history significant for hypertension, presented to emergency room because of generalized weakness lately and a fall.  The patient was too weak to get up and remained on the ground about 8 hours before he was found.  He denied any chest pain or palpitations, presyncope or syncope.  No injuries including head injuries.  No dysuria, oliguria or hematuria or flank pain.  He has mild lower extremity edema.  He admitted to diarrhea without melena or bright red bleeding per rectum.  No cough or wheezing or dyspnea.   ED Course: When he came to the ER, BP was 33/105 and heart rate 108 with respiratory rate of 24 with otherwise normal vital signs.  Labs revealed.  Labs revealed hyponatremia 129 hypochloremia of 86, hypokalemia of 2.9 and a BUN of 29 with creatinine of 2.23 and calcium of 8.6 with anion gap of 17 and AST of 98 with total bili of 2.3.  High sensitive troponin I was 34 and later 30.  Previous renal functions were within normal.  CK was significantly elevated at 3521 and CBC showed leukocytosis with neutrophilia UA showed 11-20 WBCs with rare bacteria and 100 protein with 50 glucose. EKG as reviewed by me : EKG showed supraventricular cardia with a rate of 136 without axis deviation and Q waves anteroseptally. Imaging: Chest x-ray showed no acute cardiopulmonary disease.  Head and C-spine CT showed no acute intracranial normalities and no osseous fracture or traumatic subluxation.  There was posterior longitudinal ligament medication with subsequent severe central canal stenosis at C3-4 and to lesser extent at C2-3 with multilevel foraminal stenosis.  There was stable atrophy and white matter disease.  9/9: Worsening CK level at 6455 today.  Creatinine and potassium  improving.  Gap closed.  Leukocytosis resolved. Continuing IV fluid and replacing potassium. Patient became febrile in the afternoon.  Fever of 101.1. Checking urine and blood cultures. Check procalcitonin Start him on ceftriaxone    Assessment and Plan: * Acute renal failure due to rhabdomyolysis ([place]) Some improvement with IV fluid, creatinine at 1.81 today.  Baseline seems to be around 1 CK continued to rise, at 6455 today -Continue with IV fluid -Monitor CK and BMP  Fever Patient developed fever of 101 point 1 in the afternoon.  UA with some leukocytosis but no significant urinary symptoms.  Chest x-ray was without any acute abnormality. -Check urine and blood cultures -Check procalcitonin -Start him on ceftriaxone -Continue with supportive care  Hypokalemia Hypokalemia with hypomagnesemia, -Replete potassium and magnesium -Continue to monitor  Hyponatremia Improving, sodium at 131 today. -Continue with IV fluid  Essential hypertension Blood pressure not elevated.  Patient was on Lasix and losartan at home which is being held due to AKI and rhabdomyolysis. Allergies noted for amlodipine. -As needed labetalol  GERD without esophagitis - Continue PPI therapy.  Intractable hiccups Patient takes baclofen at home which was resumed. -As needed Thorazine   Subjective: Patient was complaining of hiccups when seen today and requesting to resume home baclofen.  Physical Exam: Vitals:   11/16/21 0054 11/16/21 0441 11/16/21 0747 11/16/21 1539  BP: 117/81 132/85 (!) 113/90 (!) 179/81  Pulse: 83 73 81 (!) 120  Resp: '18 18 18 18  '$ Temp: 98.2 F (36.8 C)  98.3 F (36.8 C) (!) 97.5 F (36.4 C) (!) 101.1 F (38.4 C)  TempSrc: Oral  Oral   SpO2: 98% 99% 100% 94%  Weight:       General.  Hard of hearing elderly man, in no acute distress. Pulmonary.  Lungs clear bilaterally, normal respiratory effort. CV.  Regular rate and rhythm, no JVD, rub or murmur. Abdomen.  Soft,  nontender, nondistended, BS positive. CNS.  Alert and oriented .  No focal neurologic deficit. Extremities.  No edema, no cyanosis, pulses intact and symmetrical. Psychiatry.  Judgment and insight appears normal.  Data Reviewed: Prior data reviewed  Family Communication: Called daughter with no response  Disposition: Status is: Inpatient Remains inpatient appropriate because: Severity of illness   Planned Discharge Destination: To be determined  Time spent: 50 minutes  This record has been created using Systems analyst. Errors have been sought and corrected,but may not always be located. Such creation errors do not reflect on the standard of care.  Author: Lorella Nimrod, MD 11/16/2021 3:57 PM  For on call review [email or web address].

## 2021-11-16 NOTE — Assessment & Plan Note (Signed)
Patient takes baclofen at home which was resumed. -As needed Thorazine

## 2021-11-16 NOTE — Assessment & Plan Note (Signed)
Patient developed fever of 101 point 1 in the afternoon.  UA with some leukocytosis but no significant urinary symptoms.  Chest x-ray was without any acute abnormality. -Check urine and blood cultures -Check procalcitonin -Start him on ceftriaxone -Continue with supportive care

## 2021-11-16 NOTE — H&P (Signed)
[place]   PATIENT NAME: John Booker    MR#:  [id number]  DATE OF BIRTH:  [birth year]  DATE OF ADMISSION:  11/15/2021  PRIMARY CARE PHYSICIAN: Dion Body, MD   Patient is coming from: Home  REQUESTING/REFERRING PHYSICIAN: Harvest Dark, MD  CHIEF COMPLAINT:   Chief Complaint  Patient presents with   Fall    HISTORY OF PRESENT ILLNESS:  John Booker is a 71 y.o. male with medical history significant for hypertension, presented to emergency room because of generalized weakness lately and a fall.  The patient was too weak to get up and remained on the ground about 8 hours before he was found.  He denied any chest pain or palpitations, presyncope or syncope.  No injuries including head injuries.  No dysuria, oliguria or hematuria or flank pain.  He has mild lower extremity edema.  He admitted to diarrhea without melena or bright red bleeding per rectum.  No cough or wheezing or dyspnea.  ED Course: When he came to the ER, BP was 33/105 and heart rate 108 with respiratory rate of 24 with otherwise normal vital signs.  Labs revealed.  Labs revealed hyponatremia 129 hypochloremia of 86, hypokalemia of 2.9 and a BUN of 29 with creatinine of 2.23 and calcium of 8.6 with anion gap of 17 and AST of 98 with total bili of 2.3.  High sensitive troponin I was 34 and later 30.  Previous renal functions were within normal.  CK was significantly elevated at 3521 and CBC showed leukocytosis with neutrophilia UA showed 11-20 WBCs with rare bacteria and 100 protein with 50 glucose. EKG as reviewed by me : EKG showed supraventricular cardia with a rate of 136 without axis deviation and Q waves anteroseptally. Imaging: Chest x-ray showed no acute cardiopulmonary disease.  Head and C-spine CT showed no acute intracranial normalities and no osseous fracture or traumatic subluxation.  There was posterior longitudinal ligament medication with subsequent severe central canal stenosis at C3-4  and to lesser extent at C2-3 with multilevel foraminal stenosis.  There was stable atrophy and white matter disease.  The patient was given 2 L bolus of IV normal saline and 1 p.o. Percocet as well as 4 mg of IV morphine sulfate.  He will be admitted to a medical bed for further evaluation and management. PAST MEDICAL HISTORY:   Past Medical History:  Diagnosis Date   Hypertension     PAST SURGICAL HISTORY:   Past Surgical History:  Procedure Laterality Date   ESOPHAGOGASTRODUODENOSCOPY (EGD) WITH PROPOFOL N/A 05/26/2018   Procedure: ESOPHAGOGASTRODUODENOSCOPY (EGD) WITH PROPOFOL;  Surgeon: Virgel Manifold, MD;  Location: [facility] ENDOSCOPY;  Service: Endoscopy;  Laterality: N/A;   ESOPHAGOGASTRODUODENOSCOPY (EGD) WITH PROPOFOL N/A 12/28/2020   Procedure: ESOPHAGOGASTRODUODENOSCOPY (EGD) WITH PROPOFOL;  Surgeon: Annamaria Helling, DO;  Location: [facility] ENDOSCOPY;  Service: Gastroenterology;  Laterality: N/A;   ESOPHAGOGASTRODUODENOSCOPY (EGD) WITH PROPOFOL N/A 10/09/2021   Procedure: ESOPHAGOGASTRODUODENOSCOPY (EGD) WITH PROPOFOL;  Surgeon: [name], MD;  Location: [facility] ENDOSCOPY;  Service: Gastroenterology;  Laterality: N/A;   INCISION AND DRAINAGE PERIRECTAL ABSCESS N/A 11/19/2015   Procedure: IRRIGATION AND DEBRIDEMENT PERIRECTAL ABSCESS and debridement of scrotal abscess;  Surgeon: Florene Glen, MD;  Location: [facility] ORS;  Service: General;  Laterality: N/A;   INCISION AND DRAINAGE PERIRECTAL ABSCESS N/A 11/20/2015   Procedure: IRRIGATION AND DEBRIDEMENT PERIRECTAL ABSCESS / WITH DRESSING CHANGE;  Surgeon: Florene Glen, MD;  Location: [facility] ORS;  Service: General;  Laterality: N/A;  peri rectal site   INCISION AND DRAINAGE PERIRECTAL ABSCESS N/A 11/21/2015   Procedure: IRRIGATION AND DEBRIDEMENT PERIRECTAL ABSCESS WITH DRESSING CHANGE;  Surgeon: Florene Glen, MD;  Location: [facility] ORS;  Service: General;  Laterality: N/A;   MINOR HEMORRHOIDECTOMY  1989    SOCIAL HISTORY:    Social History   Tobacco Use   Smoking status: Former    Packs/day: 0.50    Years: 38.00    Total pack years: 19.00    Types: Cigarettes   Smokeless tobacco: Former  Substance Use Topics   Alcohol use: Not Currently    Comment: several months,[place]    FAMILY HISTORY:   Family History  Problem Relation Age of Onset   Hypertension Father     DRUG ALLERGIES:   Allergies  Allergen Reactions   Adalat [Nifedipine]     Foot swelling.    Amlodipine Other (See Comments)    Leg swelling    REVIEW OF SYSTEMS:   ROS As per history of present illness. All pertinent systems were reviewed above. Constitutional, HEENT, cardiovascular, respiratory, GI, GU, musculoskeletal, neuro, psychiatric, endocrine, integumentary and hematologic systems were reviewed and are otherwise negative/unremarkable except for positive findings mentioned above in the HPI.   MEDICATIONS AT HOME:   Prior to Admission medications   Medication Sig Start Date End Date Taking? Authorizing Provider  acetaminophen (TYLENOL) 325 MG tablet Take 2 tablets (650 mg total) by mouth every 6 (six) hours as needed for mild pain or fever. 06/27/21   Lorella Nimrod, MD  aspirin EC 81 MG EC tablet Take 1 tablet (81 mg total) by mouth daily. Swallow whole. 06/27/21   Lorella Nimrod, MD  baclofen (LIORESAL) 10 MG tablet Take 1 tablet (10 mg total) by mouth 2 (two) times daily. 05/13/21   Karamalegos, Devonne Doughty, DO  feeding supplement (ENSURE ENLIVE / ENSURE PLUS) LIQD Take 237 mLs by mouth 3 (three) times daily between meals. 12/31/20   Ezekiel Slocumb, DO  furosemide (LASIX) 20 MG tablet Take 2 tablets (40 mg total) by mouth daily. 06/27/21   Lorella Nimrod, MD  losartan (COZAAR) 100 MG tablet Take 1 tablet (100 mg total) by mouth daily. 05/13/21   Karamalegos, Devonne Doughty, DO  Multiple Vitamin (MULTIVITAMIN WITH MINERALS) TABS tablet Take 1 tablet by mouth daily. 12/31/20   Ezekiel Slocumb, DO  omeprazole (PRILOSEC) 20 MG  capsule Take 20 mg by mouth in the morning and at bedtime.    [provider]  saccharomyces boulardii (FLORASTOR) 250 MG capsule Take 1 capsule (250 mg total) by mouth 2 (two) times daily. 12/31/20   Nicole Kindred A, DO  sildenafil (REVATIO) 20 MG tablet Take 1-5 pills about 30 min prior to sex. Start with 1 and increase as needed. 12/20/19   Karamalegos, Devonne Doughty, DO  vitamin B-12 1000 MCG tablet Take 1 tablet (1,000 mcg total) by mouth daily. 12/31/20   Ezekiel Slocumb, DO  Vitamin D, Ergocalciferol, (DRISDOL) 1.25 MG (50000 UNIT) CAPS capsule Take 1 capsule (50,000 Units total) by mouth every 7 (seven) days. 01/03/21   Ezekiel Slocumb, DO      VITAL SIGNS:  Blood pressure 117/81, pulse 83, temperature 98.2 F (36.8 C), temperature source Oral, resp. rate 18, weight 100.2 kg, SpO2 98 %.  PHYSICAL EXAMINATION:  Physical Exam  GENERAL:  71 y.o.-year-old occasion Male patient lying in the bed with no acute distress.  EYES: Pupils equal, round, reactive to light and accommodation. No scleral icterus. Extraocular  muscles intact.  HEENT: Head atraumatic, normocephalic. Oropharynx and nasopharynx clear.  NECK:  Supple, no jugular venous distention. No thyroid enlargement, no tenderness.  LUNGS: Normal breath sounds bilaterally, no wheezing, rales,rhonchi or crepitation. No use of accessory muscles of respiration.  CARDIOVASCULAR: Regular rate and rhythm, S1, S2 normal. No murmurs, rubs, or gallops.  ABDOMEN: Soft, nondistended, nontender. Bowel sounds present. No organomegaly or mass.  EXTREMITIES: No pedal edema, cyanosis, or clubbing.  NEUROLOGIC: Cranial nerves II through XII are intact. Muscle strength 5/5 in all extremities. Sensation intact. Gait not checked.  PSYCHIATRIC: The patient is alert and oriented x 3.  Normal affect and good eye contact. SKIN: No obvious rash, lesion, or ulcer.   LABORATORY PANEL:   CBC Recent Labs  Lab 11/15/21 1911  WBC 13.2*  HGB  17.0  HCT 49.6  PLT 186   ------------------------------------------------------------------------------------------------------------------  Chemistries  Recent Labs  Lab 11/15/21 1911  NA 129*  K 2.9*  CL 86*  CO2 26  GLUCOSE 146*  BUN 29*  CREATININE 2.23*  CALCIUM 8.6*  AST 98*  ALT 27  ALKPHOS 65  BILITOT 2.3*   ------------------------------------------------------------------------------------------------------------------  Cardiac Enzymes No results for input(s): "TROPONINI" in the last 168 hours. ------------------------------------------------------------------------------------------------------------------  RADIOLOGY:  CT Cervical Spine Wo Contrast  Result Date: 11/15/2021 CLINICAL DATA:  Fall at home.  Neck trauma. EXAM: CT CERVICAL SPINE WITHOUT CONTRAST TECHNIQUE: Multidetector CT imaging of the cervical spine was performed without intravenous contrast. Multiplanar CT image reconstructions were also generated. RADIATION DOSE REDUCTION: This exam was performed according to the departmental dose-optimization program which includes automated exposure control, adjustment of the mA and/or kV according to patient size and/or use of iterative reconstruction technique. COMPARISON:  MRI of the cervical spine 12/29/2020 FINDINGS: Alignment: No significant listhesis is present. Straightening of the normal cervical lordosis is again noted. Skull base and vertebrae: Craniocervical junction is within normal limits. Vertebral body heights are maintained. No acute fractures are present. Soft tissues and spinal canal: No prevertebral fluid or swelling. No visible canal hematoma. 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 Moderate left foraminal stenosis at C3-4 and right foraminal stenosis at C4-5 is stable. Moderate foraminal narrowing bilaterally at C5-6 is stable. Upper chest: Lung  apices are clear. The thoracic inlet is within normal limits. IMPRESSION: 1. No acute fracture or traumatic subluxation. 2. 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 3. Multilevel foraminal stenosis as described. Electronically Signed   By: San Morelle M.D.   On: 11/15/2021 18:43   DG Chest Portable 1 View  Result Date: 11/15/2021 CLINICAL DATA:  Fall in bathroom.  Weakness. EXAM: PORTABLE CHEST 1 VIEW COMPARISON:  06/27/2011 FINDINGS: The heart size and mediastinal contours are within normal limits. Both lungs are clear. No evidence of pneumothorax or hemothorax. The visualized skeletal structures are unremarkable. IMPRESSION: No active disease. Electronically Signed   By: Marlaine Hind M.D.   On: 11/15/2021 18:40   CT HEAD WO CONTRAST (5MM)  Result Date: 11/15/2021 CLINICAL DATA:  Fall at home while going to the bathroom. No loss of consciousness. Patient denies hitting head. EXAM: CT HEAD WITHOUT CONTRAST TECHNIQUE: Contiguous axial images were obtained from the base of the skull through the vertex without intravenous contrast. RADIATION DOSE REDUCTION: This exam was performed according to the departmental dose-optimization program which includes automated  exposure control, adjustment of the mA and/or kV according to patient size and/or use of iterative reconstruction technique. COMPARISON:  CT head without contrast 06/26/2021 MR head without contrast 12/29/2020 FINDINGS: Brain: No acute infarct, hemorrhage, or mass lesion is present. Mild atrophy and white matter changes are stable, likely within normal limits for age. The ventricles are of normal size. No significant extraaxial fluid collection is present. The brainstem and cerebellum are within normal limits. Vascular: No hyperdense vessel or unexpected calcification. Skull: No significant extracranial soft tissue lesion is present. Calvarium is intact. No  focal lytic or blastic lesions are present. Cerumen is present in the external auditory canals bilaterally. Sinuses/Orbits: The paranasal sinuses and mastoid air cells are clear. The globes and orbits are within normal limits. IMPRESSION: 1. No acute intracranial abnormality or significant interval change. 2. Stable atrophy and white matter disease, likely within normal limits for age. Electronically Signed   By: San Morelle M.D.   On: 11/15/2021 18:40      IMPRESSION AND PLAN:  Assessment and Plan: * Acute renal failure due to rhabdomyolysis [facility]) - The patient be admitted to a medical bed. - We will continue hydration with IV normal saline. - We will follow serial BMPs and CK levels. - Pain management to be provided. - We will avoid nephrotoxins.  Hypokalemia - We will replace potassium and check magnesium level  Hyponatremia This is likely hypovolemic due to volume depletion dehydration. - We will hydrate with IV normal saline and follow BMP.  GERD without esophagitis - We will continue PPI therapy.  Essential hypertension - He will be placed on as needed IV labetalol. - We will holding off ARB and diuretic therapy given acute kidney injury.       DVT prophylaxis: Lovenox.  Advanced Care Planning:  Code Status: He is DNR/DNI.  This was discussed with him.  Family Communication:  The plan of care was discussed in details with the patient (and family). I answered all questions. The patient agreed to proceed with the above mentioned plan. Further management will depend upon hospital course. Disposition Plan: Back to previous home environment Consults called: none.  All the records are reviewed and case discussed with ED provider.  Status is: Inpatient   At the time of the admission, it appears that the appropriate admission status for this patient is inpatient.  This is judged to be reasonable and necessary in order to provide the required intensity of service to  ensure the patient's safety given the presenting symptoms, physical exam findings and initial radiographic and laboratory data in the context of comorbid conditions.  The patient requires inpatient status due to high intensity of service, high risk of further deterioration and high frequency of surveillance required.  I certify that at the time of admission, it is my clinical judgment that the patient will require inpatient hospital care extending more than 2 midnights.                            Dispo: The patient is from: Home              Anticipated d/c is to: Home              Patient currently is not medically stable to d/c.              Difficult to place patient: No  Christel Mormon M.D on 11/16/2021 at 3:44 AM  [facility]  From 7 PM-7 AM, contact night-coverage [email or web address]  CC: Primary care physician; Dion Body, MD

## 2021-11-16 NOTE — Hospital Course (Addendum)
Taken from H&P.  John Booker is a 71 y.o. male with medical history significant for hypertension, presented to emergency room because of generalized weakness lately and a fall.  The patient was too weak to get up and remained on the ground about 8 hours before he was found.  He denied any chest pain or palpitations, presyncope or syncope.  No injuries including head injuries.  No dysuria, oliguria or hematuria or flank pain.  He has mild lower extremity edema.  He admitted to diarrhea without melena or bright red bleeding per rectum.  No cough or wheezing or dyspnea.   ED Course: When he came to the ER, BP was 33/105 and heart rate 108 with respiratory rate of 24 with otherwise normal vital signs.  Labs revealed.  Labs revealed hyponatremia 129 hypochloremia of 86, hypokalemia of 2.9 and a BUN of 29 with creatinine of 2.23 and calcium of 8.6 with anion gap of 17 and AST of 98 with total bili of 2.3.  High sensitive troponin I was 34 and later 30.  Previous renal functions were within normal.  CK was significantly elevated at 3521 and CBC showed leukocytosis with neutrophilia UA showed 11-20 WBCs with rare bacteria and 100 protein with 50 glucose. EKG as reviewed by me : EKG showed supraventricular cardia with a rate of 136 without axis deviation and Q waves anteroseptally. Imaging: Chest x-ray showed no acute cardiopulmonary disease.  Head and C-spine CT showed no acute intracranial normalities and no osseous fracture or traumatic subluxation.  There was posterior longitudinal ligament medication with subsequent severe central canal stenosis at C3-4 and to lesser extent at C2-3 with multilevel foraminal stenosis.  There was stable atrophy and white matter disease.  9/9: Worsening CK level at 6455 today.  Creatinine and potassium improving.  Gap closed.  Leukocytosis resolved. Continuing IV fluid and replacing potassium. Patient became febrile in the afternoon.  Fever of 101.1. Checking urine and blood  cultures. Check procalcitonin Start him on ceftriaxone  9/10: Patient did not had any more fever.  Procalcitonin was elevated at 12.93 which improved to 6.56 this morning. Preliminary blood cultures with MSSA.  Urine cultures pending.  CK started improving after peaking at 6455, currently at 3511.  Renal function improved.  Stable hyponatremia with sodium of 131.  Antibiotics switched to cefazolin, echocardiogram ordered. We will continue IV fluid for another day. For his concern of lower back pain secondary to hospital bed, he was encouraged to get out of bed.  PT was also ordered. He was asking for Percocet and morphine together, explained to him that we can do 1 thing to help with this pain. Communicated with nursing staff to try Percocet first.  9/11: Repeat blood cultures remain negative in 24 hours. TTE with obvious vegetations.  Procalcitonin and CK improving. PT/OT are recommending SNF.  9/12: Renal function has been normalized.  Repeat blood cultures remain negative in 2 days.  CK continued to improve, currently at 772.  Potassium at 3.1 Taking magnesium and repleting potassium. Patient agrees to go to SNF-TOC started work-up. TEE on Wednesday. MRI of lumbar spine ordered for concern of lower back pain to rule out osteomyelitis

## 2021-11-16 NOTE — Assessment & Plan Note (Addendum)
Blood pressure not elevated.  Patient was on Lasix and losartan at home which is being held due to AKI and rhabdomyolysis. Allergies noted for amlodipine. -As needed labetalol

## 2021-11-16 NOTE — Assessment & Plan Note (Addendum)
Some improvement with IV fluid, creatinine at 1.81 today.  Baseline seems to be around 1 CK continued to rise, at 6455 today -Continue with IV fluid -Monitor CK and BMP

## 2021-11-16 NOTE — Assessment & Plan Note (Addendum)
Improving, sodium at 131 today. -Continue with IV fluid

## 2021-11-16 NOTE — Assessment & Plan Note (Addendum)
Hypokalemia with hypomagnesemia, -Replete potassium and magnesium -Continue to monitor

## 2021-11-16 NOTE — Assessment & Plan Note (Addendum)
Continue PPI therapy. 

## 2021-11-17 DIAGNOSIS — N289 Disorder of kidney and ureter, unspecified: Secondary | ICD-10-CM | POA: Diagnosis not present

## 2021-11-17 DIAGNOSIS — B9561 Methicillin susceptible Staphylococcus aureus infection as the cause of diseases classified elsewhere: Secondary | ICD-10-CM

## 2021-11-17 DIAGNOSIS — N179 Acute kidney failure, unspecified: Secondary | ICD-10-CM

## 2021-11-17 DIAGNOSIS — R7881 Bacteremia: Secondary | ICD-10-CM

## 2021-11-17 DIAGNOSIS — M6282 Rhabdomyolysis: Principal | ICD-10-CM

## 2021-11-17 LAB — BLOOD CULTURE ID PANEL (REFLEXED) - BCID2

## 2021-11-17 LAB — BASIC METABOLIC PANEL
Anion gap: 7 (ref 5–15)
BUN: 23 mg/dL (ref 8–23)
CO2: 28 mmol/L (ref 22–32)
Calcium: 7.4 mg/dL — ABNORMAL LOW (ref 8.9–10.3)
Chloride: 96 mmol/L — ABNORMAL LOW (ref 98–111)
Creatinine, Ser: 1.13 mg/dL (ref 0.61–1.24)
GFR, Estimated: >60 mL/min (ref >60)
Glucose, Bld: 96 mg/dL (ref 70–99)
Potassium: 3.5 mmol/L (ref 3.5–5.1)
Sodium: 131 mmol/L — ABNORMAL LOW (ref 135–145)

## 2021-11-17 LAB — CBC WITH DIFFERENTIAL/PLATELET
Abs Immature Granulocytes: 0.05 10*3/uL (ref 0.00–0.07)
Basophils Absolute: 0 10*3/uL (ref 0.0–0.1)
Basophils Relative: 0 %
Eosinophils Absolute: 0 10*3/uL (ref 0.0–0.5)
Eosinophils Relative: 0 %
HCT: 42.3 % (ref 39.0–52.0)
Hemoglobin: 14.4 g/dL (ref 13.0–17.0)
Immature Granulocytes: 1 %
Lymphocytes Relative: 6 %
Lymphs Abs: 0.5 10*3/uL — ABNORMAL LOW (ref 0.7–4.0)
MCH: 32.5 pg (ref 26.0–34.0)
MCHC: 34 g/dL (ref 30.0–36.0)
MCV: 95.5 fL (ref 80.0–100.0)
Monocytes Absolute: 0.8 10*3/uL (ref 0.1–1.0)
Monocytes Relative: 10 %
Neutro Abs: 7.2 10*3/uL (ref 1.7–7.7)
Neutrophils Relative %: 83 %
Platelets: 147 10*3/uL — ABNORMAL LOW (ref 150–400)
RBC: 4.43 MIL/uL (ref 4.22–5.81)
RDW: 13.4 % (ref 11.5–15.5)
WBC: 8.6 10*3/uL (ref 4.0–10.5)
nRBC: 0 % (ref 0.0–0.2)

## 2021-11-17 LAB — PROCALCITONIN: Procalcitonin: 6.56 ng/mL

## 2021-11-17 LAB — CK: Total CK: 3511 U/L — ABNORMAL HIGH (ref 49–397)

## 2021-11-17 MED ORDER — OXYCODONE-ACETAMINOPHEN 5-325 MG PO TABS
1.0000 | ORAL_TABLET | ORAL | Status: DC | PRN
  Administered 2021-11-17 – 2021-11-20 (×9): 2 via ORAL
  Filled 2021-11-17 (×10): qty 2

## 2021-11-17 MED ORDER — CEFAZOLIN SODIUM-DEXTROSE 2-4 GM/100ML-% IV SOLN
2.0000 g | Freq: Three times a day (TID) | INTRAVENOUS | Status: DC
  Administered 2021-11-17 – 2021-11-20 (×10): 2 g via INTRAVENOUS
  Filled 2021-11-17 (×11): qty 100

## 2021-11-17 NOTE — Progress Notes (Signed)
      INFECTIOUS DISEASE ATTENDING ADDENDUM:   Date: 11/17/2021  Patient name: John Booker  Medical record number: [id number]  Date of birth: [birth year]   [name] is a 71 year old man who was found down and when brought to the ER hypotensive and with rhabdomyolyses, he was not originally started on antibiotics but became febrile and then blood cultures and urine cultures were taken and since then 1/2 blood culture sites (within 12 hours) has begun growing [place] with BCID ID [id number].  He has been narrowed to cefazolin and blood cultures repeated, TTE is ordered  I have spent more than 15 minutes reviewing the patient and his labs and discussing the case for more than 5 minutes with Dr. Reesa Chew.  I will let Dr. Delaine Lame know about the patient and she will formally consult on him tomorrow.  [name] 11/17/2021, 2:55 PM

## 2021-11-17 NOTE — Progress Notes (Signed)
PHARMACY - PHYSICIAN COMMUNICATION CRITICAL VALUE ALERT - BLOOD CULTURE IDENTIFICATION (BCID)  John Booker is an 71 y.o. male who presented to [facility] on 11/15/2021 with a chief complaint of generalized weakness lately and a fall  Assessment:  1/4 (anerobic) GPC: MSSA- unknown source.    Name of physician (or Provider) Contacted: Dr. Reesa Chew, [place]  Current antibiotics: ceftriaxone 2 gram Q24H  Changes to prescribed antibiotics recommended: Change antibiotic to Ancef 2 gram Q8H for MSSA bacteremia  Recommendations accepted by provider  Results for orders placed or performed during the hospital encounter of 11/15/21  Blood Culture ID Panel (Reflexed) (Collected: 11/16/2021  6:28 PM)  Result Value Ref Range   Enterococcus faecalis NOT DETECTED NOT DETECTED   Enterococcus Faecium NOT DETECTED NOT DETECTED   Listeria monocytogenes NOT DETECTED NOT DETECTED   Staphylococcus species DETECTED (A) NOT DETECTED   Staphylococcus aureus (BCID) DETECTED (A) NOT DETECTED   Staphylococcus epidermidis NOT DETECTED NOT DETECTED   Staphylococcus lugdunensis NOT DETECTED NOT DETECTED   Streptococcus species NOT DETECTED NOT DETECTED   Streptococcus agalactiae NOT DETECTED NOT DETECTED   Streptococcus pneumoniae NOT DETECTED NOT DETECTED   Streptococcus pyogenes NOT DETECTED NOT DETECTED   A.calcoaceticus-baumannii NOT DETECTED NOT DETECTED   Bacteroides fragilis NOT DETECTED NOT DETECTED   Enterobacterales NOT DETECTED NOT DETECTED   Enterobacter cloacae complex NOT DETECTED NOT DETECTED   Escherichia coli NOT DETECTED NOT DETECTED   Klebsiella aerogenes NOT DETECTED NOT DETECTED   Klebsiella oxytoca NOT DETECTED NOT DETECTED   Klebsiella pneumoniae NOT DETECTED NOT DETECTED   Proteus species NOT DETECTED NOT DETECTED   Salmonella species NOT DETECTED NOT DETECTED   Serratia marcescens NOT DETECTED NOT DETECTED   Haemophilus influenzae NOT DETECTED NOT DETECTED   Neisseria meningitidis NOT  DETECTED NOT DETECTED   Pseudomonas aeruginosa NOT DETECTED NOT DETECTED   Stenotrophomonas maltophilia NOT DETECTED NOT DETECTED   Candida albicans NOT DETECTED NOT DETECTED   Candida auris NOT DETECTED NOT DETECTED   Candida glabrata NOT DETECTED NOT DETECTED   Candida krusei NOT DETECTED NOT DETECTED   Candida parapsilosis NOT DETECTED NOT DETECTED   Candida tropicalis NOT DETECTED NOT DETECTED   Cryptococcus neoformans/gattii NOT DETECTED NOT DETECTED   Meth resistant mecA/C and MREJ NOT DETECTED NOT John Booker, PharmD, BCPS Clinical Pharmacist   11/17/2021  11:40 AM

## 2021-11-17 NOTE — Assessment & Plan Note (Signed)
Improved. -Replete potassium and magnesium as needed -Continue to monitor

## 2021-11-17 NOTE — Assessment & Plan Note (Signed)
Continue PPI therapy. 

## 2021-11-17 NOTE — Assessment & Plan Note (Signed)
Patient takes baclofen at home which was resumed.  Apparently a chronic issue which has been investigated, CT chest with concern of mediastinal masses which were followed up by PET scan and they were negative for malignancy. -As needed Thorazine

## 2021-11-17 NOTE — Assessment & Plan Note (Signed)
Blood and urine cultures were sent yesterday when he developed fever.  Elevated procalcitonin which started improving today.  Preliminary blood cultures with MSSA.  Urine cultures pending. -Switch to cefazolin -ID consult -Echocardiogram-if negative then he might need TEE.

## 2021-11-17 NOTE — Assessment & Plan Note (Signed)
Stable at 131.  Might have some element of SIADH along with poor p.o. intake. -Continue with IV fluid -Monitor sodium

## 2021-11-17 NOTE — Assessment & Plan Note (Signed)
Blood pressure mildly elevated.  Patient was on Lasix and losartan at home which is being held due to AKI and rhabdomyolysis. Allergies noted for amlodipine. -As needed labetalol

## 2021-11-17 NOTE — Progress Notes (Signed)
Progress Note   Patient: John Booker:[id number] DOB: 1951/02/03 DOA: 11/15/2021     2 DOS: the patient was seen and examined on 11/17/2021   Brief hospital course: Taken from H&P.  John Booker is a 71 y.o. male with medical history significant for hypertension, presented to emergency room because of generalized weakness lately and a fall.  The patient was too weak to get up and remained on the ground about 8 hours before he was found.  He denied any chest pain or palpitations, presyncope or syncope.  No injuries including head injuries.  No dysuria, oliguria or hematuria or flank pain.  He has mild lower extremity edema.  He admitted to diarrhea without melena or bright red bleeding per rectum.  No cough or wheezing or dyspnea.   ED Course: When he came to the ER, BP was 33/105 and heart rate 108 with respiratory rate of 24 with otherwise normal vital signs.  Labs revealed.  Labs revealed hyponatremia 129 hypochloremia of 86, hypokalemia of 2.9 and a BUN of 29 with creatinine of 2.23 and calcium of 8.6 with anion gap of 17 and AST of 98 with total bili of 2.3.  High sensitive troponin I was 34 and later 30.  Previous renal functions were within normal.  CK was significantly elevated at 3521 and CBC showed leukocytosis with neutrophilia UA showed 11-20 WBCs with rare bacteria and 100 protein with 50 glucose. EKG as reviewed by me : EKG showed supraventricular cardia with a rate of 136 without axis deviation and Q waves anteroseptally. Imaging: Chest x-ray showed no acute cardiopulmonary disease.  Head and C-spine CT showed no acute intracranial normalities and no osseous fracture or traumatic subluxation.  There was posterior longitudinal ligament medication with subsequent severe central canal stenosis at C3-4 and to lesser extent at C2-3 with multilevel foraminal stenosis.  There was stable atrophy and white matter disease.  9/9: Worsening CK level at 6455 today.  Creatinine and potassium  improving.  Gap closed.  Leukocytosis resolved. Continuing IV fluid and replacing potassium. Patient became febrile in the afternoon.  Fever of 101.1. Checking urine and blood cultures. Check procalcitonin Start him on ceftriaxone  9/10: Patient did not had any more fever.  Procalcitonin was elevated at 12.93 which improved to 6.56 this morning. Preliminary blood cultures with MSSA.  Urine cultures pending.  CK started improving after peaking at 6455, currently at 3511.  Renal function improved.  Stable hyponatremia with sodium of 131.  Antibiotics switched to cefazolin, echocardiogram ordered. We will continue IV fluid for another day. For his concern of lower back pain secondary to hospital bed, he was encouraged to get out of bed.  PT was also ordered. He was asking for Percocet and morphine together, explained to him that we can do 1 thing to help with this pain. Communicated with nursing staff to try Percocet first.   Assessment and Plan: * Acute renal failure due to rhabdomyolysis ([place]) Some improvement with IV fluid, creatinine at 1.81 today.  Baseline seems to be around 1 CK continued to rise, at 6455 today -Continue with IV fluid -Monitor CK and BMP  Bacteremia due to methicillin susceptible Staphylococcus aureus (MSSA) Blood and urine cultures were sent yesterday when he developed fever.  Elevated procalcitonin which started improving today.  Preliminary blood cultures with MSSA.  Urine cultures pending. -Switch to cefazolin -ID consult -Echocardiogram-if negative then he might need TEE.  Hypokalemia Improved. -Replete potassium and magnesium as needed -Continue to  monitor  Hyponatremia Stable at 131.  Might have some element of SIADH along with poor p.o. intake. -Continue with IV fluid -Monitor sodium  Essential hypertension Blood pressure mildly elevated.  Patient was on Lasix and losartan at home which is being held due to AKI and rhabdomyolysis. Allergies noted  for amlodipine. -As needed labetalol  GERD without esophagitis - Continue PPI therapy.  Intractable hiccups Patient takes baclofen at home which was resumed.  Apparently a chronic issue which has been investigated, CT chest with concern of mediastinal masses which were followed up by PET scan and they were negative for malignancy. -As needed Thorazine  Subjective: Patient was seen and examined today.  He was complaining of lower back pain since in the hospital, stating that this bed is very uncomfortable. Encouraged patient to get out of the bed and sit in chair. PT was also ordered.  Physical Exam: Vitals:   11/16/21 1847 11/16/21 2200 11/17/21 0440 11/17/21 0745  BP: 119/63 (!) 162/91 122/77 (!) 147/82  Pulse: (!) 104 99 (!) 102 90  Resp:  '18 18 14  '$ Temp: 98.9 F (37.2 C) 97.8 F (36.6 C) 97.8 F (36.6 C) 98.7 F (37.1 C)  TempSrc: Oral Oral  Oral  SpO2: 95% 98% 92% 95%  Weight:  100.2 kg    Height:  '6\' 3"'$  (1.905 m)     General.  Obese elderly man, hard of hearing, in no acute distress. Pulmonary.  Lungs clear bilaterally, normal respiratory effort. CV.  Regular rate and rhythm, no JVD, rub or murmur. Abdomen.  Soft, nontender, nondistended, BS positive. CNS.  Alert and oriented .  No focal neurologic deficit. Extremities.  No edema, no cyanosis, pulses intact and symmetrical. Psychiatry.  Judgment and insight appears normal.  Data Reviewed: Prior data reviewed  Family Communication: Tried calling daughter again with no response  Disposition: Status is: Inpatient Remains inpatient appropriate because: Severity of illness   Planned Discharge Destination: Home with Home Health  DVT prophylaxis.  Lovenox Time spent: 50 minutes  This record has been created using Systems analyst. Errors have been sought and corrected,but may not always be located. Such creation errors do not reflect on the standard of care.  Author: Lorella Nimrod, MD 11/17/2021  1:43 PM  For on call review [email or web address].

## 2021-11-18 ENCOUNTER — Inpatient Hospital Stay (HOSPITAL_COMMUNITY)
Admit: 2021-11-18 | Discharge: 2021-11-18 | Disposition: A | Discharge status: Home / Self Care | Payer: Medicare Other | Attending: Internal Medicine | Admitting: Internal Medicine

## 2021-11-18 ENCOUNTER — Telehealth: Payer: Self-pay | Admitting: Oncology

## 2021-11-18 DIAGNOSIS — R7881 Bacteremia: Secondary | ICD-10-CM | POA: Diagnosis not present

## 2021-11-18 DIAGNOSIS — N179 Acute kidney failure, unspecified: Secondary | ICD-10-CM | POA: Diagnosis not present

## 2021-11-18 DIAGNOSIS — R066 Hiccough: Secondary | ICD-10-CM

## 2021-11-18 DIAGNOSIS — M6282 Rhabdomyolysis: Secondary | ICD-10-CM | POA: Diagnosis not present

## 2021-11-18 LAB — ECHOCARDIOGRAM COMPLETE
AR max vel: 2.46 cm2
AV Area VTI: 3.07 cm2
AV Area mean vel: 2.44 cm2
AV Mean grad: 2 mmHg
AV Peak grad: 4 mmHg
Ao pk vel: 1 m/s
Area-P 1/2: 4.39 cm2
Height: 75 in
S' Lateral: 2.3 cm
Weight: 3534.41 oz

## 2021-11-18 LAB — CK: Total CK: 1593 U/L — ABNORMAL HIGH (ref 49–397)

## 2021-11-18 LAB — PROCALCITONIN: Procalcitonin: 3.48 ng/mL

## 2021-11-18 NOTE — Evaluation (Signed)
Occupational Therapy Evaluation Patient Details Name: John Booker MRN: [id number] DOB: [birth year] Today's Date: 11/18/2021   History of Present Illness Pt admitted for ARF secondary to rhabo. History includes HTN. Pt reports he tripped and fell going to bathroom at home and was on the floor x 8 hours, unable to get up independently. Recent hospitaliztion for similar issue   Clinical Impression   Pt was seen for OT evaluation this date. Prior to hospital admission, pt was living by himself with limited assistance available and reports was generally able to complete ADL and IADL himself. He does report using 2WW for going to/from bathroom and kitchen "sometimes" and notes he typically does not use AD while walking down his driveway. Per chart/recent PT evaluation, pt reported not using AD at all. No family present to verify PLOF/home setup. Pt presents to acute OT demonstrating impaired ADL performance and functional mobility 2/2 decreased strength, activity tolerance, and balance (See OT problem list). Pt declined ADL/mobility with OT. Pt stated "I get choked up" and held a napkin to his mouth. When asked if he was going to be sick, pt stated, "I don't know. I get the hiccups if I talk too much. I have had hiccups every day for the past 5 years and I don't want them again. So I need you to stop talking." Pt then asked OT several questions unprompted. Pt would benefit from skilled OT services to address noted impairments and functional limitations (see below for any additional details) in order to maximize safety and independence while minimizing falls risk and caregiver burden. Upon hospital discharge, recommend STR to maximize pt safety and return to PLOF.     Recommendations for follow up therapy are one component of a multi-disciplinary discharge planning process, led by the attending physician.  Recommendations may be updated based on patient status, additional functional criteria and insurance  authorization.   Follow Up Recommendations  Skilled nursing-short term rehab (<3 hours/day)    Assistance Recommended at Discharge Frequent or constant Supervision/Assistance  Patient can return home with the following A lot of help with walking and/or transfers;A lot of help with bathing/dressing/bathroom;Assist for transportation;Help with stairs or ramp for entrance    Functional Status Assessment  Patient has had a recent decline in their functional status and demonstrates the ability to make significant improvements in function in a reasonable and predictable amount of time.  Equipment Recommendations  None recommended by OT    Recommendations for Other Services       Precautions / Restrictions Precautions Precautions: Fall Restrictions Weight Bearing Restrictions: No      Mobility Bed Mobility               General bed mobility comments: NT, up in recliner    Transfers                   General transfer comment: Pt declined 2/2 fear of hiccups returning, per PT evaluation anticipate pt will require MOD A      Balance Overall balance assessment: Needs assistance, History of Falls Sitting-balance support: Feet supported, No upper extremity supported Sitting balance-Leahy Scale: Fair                                     ADL either performed or assessed with clinical judgement   ADL  General ADL Comments: Limited ADL assessment 2/2 pt endorsing that with additional talking and activity he is at high risk of hiccups returning, stating, "I've had hiccups every day for 5 years. I don't want them again!" Anticipate pt requires at least MIN A for LB ADL tasks and MOD A for toilet transfers with RW.     Vision         Perception     Praxis      Pertinent Vitals/Pain Pain Assessment Pain Assessment: Faces Faces Pain Scale: Hurts a little bit Pain Location: neck Pain  Descriptors / Indicators: Aching Pain Intervention(s): Limited activity within patient's tolerance, Monitored during session, Repositioned     Hand Dominance     Extremity/Trunk Assessment Upper Extremity Assessment Upper Extremity Assessment: Generalized weakness   Lower Extremity Assessment Lower Extremity Assessment: Generalized weakness       Communication Communication Communication: No difficulties   Cognition Arousal/Alertness: Awake/alert Behavior During Therapy: WFL for tasks assessed/performed Overall Cognitive Status: No family/caregiver present to determine baseline cognitive functioning                                 General Comments: Pt expresses complaints/frustrations, questionable historian with discrepancies noted with PLOF reporting between OT and PT evaluations.     General Comments       Exercises Other Exercises Other Exercises: Pt educated in role of OT in acute care   Shoulder Instructions      Home Living Family/patient expects to be discharged to:: Private residence Living Arrangements: Alone Available Help at Discharge: Family;Available PRN/intermittently Type of Home: House Home Access: Stairs to enter Entrance Stairs-Number of Steps: 5 Entrance Stairs-Rails: Left Home Layout: One level               Home Equipment: Conservation officer, nature (2 wheels)   Additional Comments: reports he has family near by, however unable to provide reliable assistance. Uses RW for all mobility      Prior Functioning/Environment Prior Level of Function : Driving;Independent/Modified Independent;History of Falls (last six months)             Mobility Comments: reports he was previously indep and has been "awhile" since last fall. Has been able to drive.  With OT, pt reports "soemtimes" using RW to go to the bathroom or kitchen, but also reports he goes down the driveway without RW. No family present to verify. ADLs Comments: Pt reports indep  with ADL and IADL        OT Problem List: Decreased strength;Pain;Decreased activity tolerance;Decreased safety awareness;Impaired balance (sitting and/or standing);Decreased knowledge of use of DME or AE      OT Treatment/Interventions: Self-care/ADL training;Therapeutic activities;Therapeutic exercise;DME and/or AE instruction;Patient/family education;Balance training    OT Goals(Current goals can be found in the care plan section) Acute Rehab OT Goals Patient Stated Goal: go home OT Goal Formulation: With patient Time For Goal Achievement: 12/02/21 Potential to Achieve Goals: Good ADL Goals Additional ADL Goal #1: Pt will complete all aspects of dressing, primarily seated, with supervision, 2/2 opportunities. Additional ADL Goal #2: Pt will complete all aspects of bathing, primarily seated, with supervision, 2/2 opportunities. Additional ADL Goal #3: Pt will verbalize plan to implement at least 2 learned falls prevention strategies.  OT Frequency: Min 2X/week    Co-evaluation              AM-PAC OT "6 Clicks" Daily Activity  Outcome Measure Help from another person eating meals?: None Help from another person taking care of personal grooming?: A Little Help from another person toileting, which includes using toliet, bedpan, or urinal?: A Lot Help from another person bathing (including washing, rinsing, drying)?: A Lot Help from another person to put on and taking off regular upper body clothing?: A Little Help from another person to put on and taking off regular lower body clothing?: A Lot 6 Click Score: 16   End of Session    Activity Tolerance: Other (comment) (concerned by hiccups or nausea, pt unsure which) Patient left: in chair;with call bell/phone within reach  OT Visit Diagnosis: Other abnormalities of gait and mobility (R26.89);History of falling (Z91.81);Muscle weakness (generalized) (M62.81);Pain Pain - Right/Left:  (neck)                Time:  1010-1020 OT Time Calculation (min): 10 min Charges:  OT General Charges $OT Visit: 1 Visit OT Evaluation $OT Eval Low Complexity: 1 Low  Ardeth Perfect., MPH, MS, OTR/L ascom [phone] 11/18/21, 1:45 PM

## 2021-11-18 NOTE — Consult Note (Signed)
NAME: John Booker  DOB: Jul 04, 1950  MRN: [id number]  Date/Time: 11/18/2021 1:39 PM  REQUESTING PROVIDER: Dr.Amin Subjective:  REASON FOR CONSULT: staph aureus bacteremia ? John Booker is a 71 y.o. with a history of BPH, ( cystoscopy on 11/05/21). Paraspinal soft tissue lobulated partially fat density enhancing mass  on the left T9-T10 t, neg PET(11/12/21) presented from  home Brought in by EMS on 9/8 for a fall and being on the floor for 8 hours. Pt says he is not sure how he fell HE has a walker He live son his own He has Hiccups and difficulty in swallowing which has been going for years HE has pain neck and arms which eh says is due to 2 disc prolapse He has chronic low back pain    11/15/21  BP 128/74  Temp 98.7 F (37.1 C)  Pulse Rate 88  Resp 17  SpO2 99 %    Latest Reference Range & Units 11/15/21  WBC 4.0 - 10.5 K/uL 13.2 (H)  Hemoglobin 13.0 - 17.0 g/dL 17.0  HCT 39.0 - 52.0 % 49.6  Platelets 150 - 400 K/uL 186  Creatinine 0.61 - 1.24 mg/dL 2.23 (H) (C) [1]    Found to have rhabdomyolysis and AKI HE had a fever  the next day and blood culture was s ent and it came back as MSSA in 1 set of 2 I am seeing the patient for the same ID on call over the weekend discussed the case with hospitalist. Pt is on cefazolin    CT cervical spine  Ossification of the posterior longitudinal ligament results in severe central canal stenosis at C3-4 and to a lesser extent at C2-3.  Has been worked up for intractable hiccups  Initially May 2019 with woeight loss of 20 pounds- barium swallow showed esophageal dysmotility and spasm and inability of the lower esophagus to relax. EGD was done on 05/26/18 and showed small hiatus hernia Biopsy of the GEJ  March 2020 showed STRATIFIED SQUAMOUS EPITHELIUM WITH REACTIVE PARAKERATOSIS AND FOCAL  MILD GLYCOGENIC ACANTHOSIS  Repeat endoscoy on 12/28/2020 Duodenal erosions without bleeding.  - Abnormal esophageal motility. Dilated. -  Normal mucosa was found in the entire esophagus.   Pathology- non specific chronic duodenitis and reactive gastropathy  On 10/09/21 he had another endoscopy and it showed LA Grade B esophagitis with no bleeding. - Normal stomach. - Duodenitis  12/27/20 Barium swallow Esophagus: The esophagus was moderately dilated. No fixed stricture or mucosal lesion was identified.  There was  marked esophageal dysmotility with transient appearance of esophageal spasm.  Another endosocpy on 10/09/21 showed duodenitis DUODENAL MUCOSA WITH MODERATE BENIGN BRUNNER'S GLAND HYPERPLASIA.    Past Medical History:  Diagnosis Date   Hypertension    Chronic diastolic CHF  Past Surgical History:  Procedure Laterality Date   ESOPHAGOGASTRODUODENOSCOPY (EGD) WITH PROPOFOL N/A 05/26/2018   Procedure: ESOPHAGOGASTRODUODENOSCOPY (EGD) WITH PROPOFOL;  Surgeon: Virgel Manifold, MD;  Location: [facility] ENDOSCOPY;  Service: Endoscopy;  Laterality: N/A;   ESOPHAGOGASTRODUODENOSCOPY (EGD) WITH PROPOFOL N/A 12/28/2020   Procedure: ESOPHAGOGASTRODUODENOSCOPY (EGD) WITH PROPOFOL;  Surgeon: Annamaria Helling, DO;  Location: [facility] ENDOSCOPY;  Service: Gastroenterology;  Laterality: N/A;   ESOPHAGOGASTRODUODENOSCOPY (EGD) WITH PROPOFOL N/A 10/09/2021   Procedure: ESOPHAGOGASTRODUODENOSCOPY (EGD) WITH PROPOFOL;  Surgeon: [name], MD;  Location: [facility] ENDOSCOPY;  Service: Gastroenterology;  Laterality: N/A;   INCISION AND DRAINAGE PERIRECTAL ABSCESS N/A 11/19/2015   Procedure: IRRIGATION AND DEBRIDEMENT PERIRECTAL ABSCESS and debridement of scrotal abscess;  Surgeon: Delfino Lovett  Melchor Amour, MD;  Location: [facility] ORS;  Service: General;  Laterality: N/A;   INCISION AND DRAINAGE PERIRECTAL ABSCESS N/A 11/20/2015   Procedure: IRRIGATION AND DEBRIDEMENT PERIRECTAL ABSCESS / WITH DRESSING CHANGE;  Surgeon: Florene Glen, MD;  Location: [facility] ORS;  Service: General;  Laterality: N/A;  peri rectal site   INCISION AND DRAINAGE PERIRECTAL  ABSCESS N/A 11/21/2015   Procedure: IRRIGATION AND DEBRIDEMENT PERIRECTAL ABSCESS WITH DRESSING CHANGE;  Surgeon: Florene Glen, MD;  Location: [facility] ORS;  Service: General;  Laterality: N/A;   MINOR HEMORRHOIDECTOMY  1989    Social History   Socioeconomic History   Marital status: Legally Separated    Spouse name: Not on file   Number of children: Not on file   Years of education: Not on file   Highest education level: Not on file  Occupational History   Not on file  Tobacco Use   Smoking status: Former    Packs/day: 0.50    Years: 38.00    Total pack years: 19.00    Types: Cigarettes   Smokeless tobacco: Former  Scientific laboratory technician Use: Never used  Substance and Sexual Activity   Alcohol use: Not Currently    Comment: several months,[place]   Drug use: No   Sexual activity: Not Currently  Other Topics Concern   Not on file  Social History Narrative   Not on file   Social Determinants of Health   Financial Resource Strain: Not on file  Food Insecurity: Not on file  Transportation Needs: Not on file  Physical Activity: Not on file  Stress: Not on file  Social Connections: Not on file  Intimate Partner Violence: Not on file    Family History  Problem Relation Age of Onset   Hypertension Father    Allergies  Allergen Reactions   Adalat [Nifedipine]     Foot swelling.    Amlodipine Other (See Comments)    Leg swelling   I? Current Facility-Administered Medications  Medication Dose Route Frequency Provider Last Rate Last Admin   0.9 %  sodium chloride infusion   Intravenous Continuous Lorella Nimrod, MD 100 mL/hr at 11/18/21 1330 New Bag at 11/18/21 1330   acetaminophen (TYLENOL) tablet 650 mg  650 mg Oral Q6H PRN Mansy, Jan A, MD   650 mg at 11/16/21 1633   Or   acetaminophen (TYLENOL) suppository 650 mg  650 mg Rectal Q6H PRN Mansy, Jan A, MD       aspirin EC tablet 81 mg  81 mg Oral Daily Mansy, Jan A, MD   81 mg at 11/18/21 0941   baclofen (LIORESAL)  tablet 10 mg  10 mg Oral BID Mansy, Jan A, MD   10 mg at 11/18/21 0941   ceFAZolin (ANCEF) IVPB 2g/100 mL premix  2 g Intravenous Q8H Amin, Soundra Pilon, MD 200 mL/hr at 11/18/21 1331 2 g at 11/18/21 1331   chlorproMAZINE (THORAZINE) 12.5 mg in sodium chloride 0.9 % 25 mL IVPB  12.5 mg Intravenous Q6H PRN Lorella Nimrod, MD   Stopped at 11/16/21 1720   cyanocobalamin (VITAMIN B12) tablet 1,000 mcg  1,000 mcg Oral Daily Mansy, Jan A, MD   1,000 mcg at 11/18/21 0941   enoxaparin (LOVENOX) injection 50 mg  0.5 mg/kg Subcutaneous Q24H Mansy, Jan A, MD   50 mg at 11/18/21 0944   feeding supplement (ENSURE ENLIVE / ENSURE PLUS) liquid 237 mL  237 mL Oral TID BM Mansy, Arvella Merles, MD  labetalol (NORMODYNE) injection 10 mg  10 mg Intravenous Q2H PRN Lorella Nimrod, MD       magnesium hydroxide (MILK OF MAGNESIA) suspension 30 mL  30 mL Oral Daily PRN Mansy, Jan A, MD       morphine (PF) 2 MG/ML injection 2 mg  2 mg Intravenous Q4H PRN Mansy, Jan A, MD   2 mg at 11/17/21 0641   multivitamin with minerals tablet 1 tablet  1 tablet Oral Daily Mansy, Jan A, MD   1 tablet at 11/18/21 0941   ondansetron (ZOFRAN) tablet 4 mg  4 mg Oral Q6H PRN Mansy, Jan A, MD       Or   ondansetron [facility]) injection 4 mg  4 mg Intravenous Q6H PRN Mansy, Arvella Merles, MD       Oral care mouth rinse  15 mL Mouth Rinse PRN Lorella Nimrod, MD       oxyCODONE-acetaminophen (PERCOCET/ROXICET) 5-325 MG per tablet 1-2 tablet  1-2 tablet Oral Q4H PRN Lorella Nimrod, MD   2 tablet at 11/18/21 0941   pantoprazole (PROTONIX) EC tablet 40 mg  40 mg Oral Daily Mansy, Jan A, MD   40 mg at 11/18/21 0941   saccharomyces boulardii (FLORASTOR) capsule 250 mg  250 mg Oral BID Mansy, Jan A, MD   250 mg at 11/18/21 0941   sodium chloride 0.9 % bolus 1,000 mL  1,000 mL Intravenous Once Harvest Dark, MD       traZODone (DESYREL) tablet 25 mg  25 mg Oral QHS PRN Mansy, Jan A, MD   25 mg at 11/16/21 0111   Vitamin D (Ergocalciferol) (DRISDOL) 1.25 MG (50000  UNIT) capsule 50,000 Units  50,000 Units Oral Q7 days Mansy, Jan A, MD         Abtx:  Anti-infectives (From admission, onward)    Start     Dose/Rate Route Frequency Ordered Stop   11/17/21 1400  ceFAZolin (ANCEF) IVPB 2g/100 mL premix        2 g 200 mL/hr over 30 Minutes Intravenous Every 8 hours 11/17/21 1139     11/16/21 1645  cefTRIAXone (ROCEPHIN) 2 g in sodium chloride 0.9 % 100 mL IVPB  Status:  Discontinued        2 g 200 mL/hr over 30 Minutes Intravenous Every 24 hours 11/16/21 1546 11/17/21 1139       REVIEW OF SYSTEMS:  Const: negative fever, negative chills, negative weight loss Eyes: negative diplopia or visual changes, negative eye pain ENT: negative coryza, negative sore throat Resp: negative cough, hemoptysis, dyspnea Cards: negative for chest pain, palpitations, lower extremity edema GU: negative for frequency, dysuria and hematuria GI: Negative for abdominal pain, diarrhea, bleeding, constipation Skin: negative for rash and pruritus Heme: negative for easy bruising and gum/nose bleeding MS: neck pain, low back pain Neurolo:negative for headaches, dizziness, vertigo, memory problems  Psych: negative for feelings of anxiety, depression  Endocrine: negative for thyroid, diabetes Allergy/Immunology- as above : Objective:  VITALS:  BP (!) 152/76   Pulse 89   Temp 98.3 F (36.8 C) (Oral)   Resp 20   Ht '6\' 3"'$  (1.905 m)   Wt 100.2 kg   SpO2 96%   BMI 27.61 kg/m   PHYSICAL EXAM:  General: Alert, not too cooperative, no distress, appears stated age.  Head: Normocephalic, without obvious abnormality, atraumatic. Eyes: Conjunctivae injected, anicteric sclerae. Pupils are equal ENT Nares normal. No drainage or sinus tenderness. Lips, mucosa, and tongue normal. No Thrush Neck: symmetrical,  no adenopathy, thyroid: non tender no carotid bruit and no JVD. Back: No CVA tenderness. Lungs:b/l air entry Heart: Regular rate and rhythm, no murmur, rub or  gallop. Abdomen: Soft, non-tender,not distended. Bowel sounds normal. No masses Extremities: bruising over upper extremities Edema legs Wound over the toes Skin: No rashes or lesions. Or bruising Lymph: Cervical, supraclavicular normal. Neurologic: did not examine in detail Pertinent Labs Lab Results CBC    Component Value Date/Time   WBC 8.6 11/17/2021 0500   RBC 4.43 11/17/2021 0500   HGB 14.4 11/17/2021 0500   HGB 15.7 04/15/2012 2049   HCT 42.3 11/17/2021 0500   HCT 47.7 04/15/2012 2049   PLT 147 (L) 11/17/2021 0500   PLT 175 04/15/2012 2049   MCV 95.5 11/17/2021 0500   MCV 92 04/15/2012 2049   MCH 32.5 11/17/2021 0500   MCHC 34.0 11/17/2021 0500   RDW 13.4 11/17/2021 0500   RDW 13.9 04/15/2012 2049   LYMPHSABS 0.5 (L) 11/17/2021 0500   MONOABS 0.8 11/17/2021 0500   EOSABS 0.0 11/17/2021 0500   BASOSABS 0.0 11/17/2021 0500       Latest Ref Rng & Units 11/17/2021    5:00 AM 11/16/2021    5:35 AM 11/15/2021    7:11 PM  CMP  Glucose 70 - 99 mg/dL 96  124  146   BUN 8 - 23 mg/dL '23  31  29   '$ Creatinine 0.61 - 1.24 mg/dL 1.13  1.81  2.23  C  Sodium 135 - 145 mmol/L 131  131  129   Potassium 3.5 - 5.1 mmol/L 3.5  3.1  2.9   Chloride 98 - 111 mmol/L 96  91  86   CO2 22 - 32 mmol/L '28  29  26   '$ Calcium 8.9 - 10.3 mg/dL 7.4  7.8  8.6   Total Protein 6.5 - 8.1 g/dL   6.7   Total Bilirubin 0.3 - 1.2 mg/dL   2.3   Alkaline Phos 38 - 126 U/L   65   AST 15 - 41 U/L   98   ALT 0 - 44 U/L   27     C Corrected result      Microbiology: Recent Results (from the past 240 hour(s))  SARS Coronavirus 2 by RT PCR (hospital order, performed in [place] hospital lab) *cepheid single result test* Anterior Nasal Swab     Status: None   Collection Time: 11/15/21  7:11 PM   Specimen: Anterior Nasal Swab  Result Value Ref Range Status   SARS Coronavirus 2 by RT PCR NEGATIVE NEGATIVE Final    Comment: (NOTE) SARS-CoV-2 target nucleic acids are NOT DETECTED.  The SARS-CoV-2 RNA  is generally detectable in upper and lower respiratory specimens during the acute phase of infection. The lowest concentration of SARS-CoV-2 viral copies this assay can detect is 250 copies / mL. A negative result does not preclude SARS-CoV-2 infection and should not be used as the sole basis for treatment or other patient management decisions.  A negative result may occur with improper specimen collection / handling, submission of specimen other than nasopharyngeal swab, presence of viral mutation(s) within the areas targeted by this assay, and inadequate number of viral copies (<250 copies / mL). A negative result must be combined with clinical observations, patient history, and epidemiological information.  Fact Sheet for Patients:   https://www.patel.info/  Fact Sheet for Healthcare Providers: https://hall.com/  This test is not yet approved or  cleared by the [place]  States FDA and has been authorized for detection and/or diagnosis of SARS-CoV-2 by FDA under an Emergency Use Authorization (EUA).  This EUA will remain in effect (meaning this test can be used) for the duration of the COVID-19 declaration under Section 564(b)(1) of the Act, 21 U.S.C. section 360bbb-3(b)(1), unless the authorization is terminated or revoked sooner.  Performed at [facility], [address], [place], [place] [address]   Urine Culture     Status: Abnormal (Preliminary result)   Collection Time: 11/16/21  3:46 PM   Specimen: Urine, Clean Catch  Result Value Ref Range Status   Specimen Description   Final    URINE, CLEAN CATCH Performed at [facility], [address] [place], [place] [address]    Special Requests   Final    NONE Performed at [facility], [address] [place], [place] [address]    Culture >=100,000 COLONIES/mL STAPHYLOCOCCUS EPIDERMIDIS (A)  Final   Report Status PENDING  Incomplete  Culture, blood  (Routine X 2) w Reflex to ID Panel     Status: Abnormal (Preliminary result)   Collection Time: 11/16/21  6:28 PM   Specimen: BLOOD  Result Value Ref Range Status   Specimen Description   Final    BLOOD LEFT AC Performed at [facility], [address], [place], [place] [address]    Special Requests   Final    BOTTLES DRAWN AEROBIC AND ANAEROBIC Blood Culture results may not be optimal due to an excessive volume of blood received in culture bottles Performed at [facility], [address] [place], [place] [address]    Culture  Setup Time   Final    GRAM POSITIVE COCCI IN BOTH AEROBIC AND ANAEROBIC BOTTLES CRITICAL RESULT CALLED TO, READ BACK BY AND VERIFIED WITH: CARISSA [facility] 11/17/21 1131 AMK    Culture (A)  Final    STAPHYLOCOCCUS AUREUS SUSCEPTIBILITIES TO FOLLOW Performed at [facility], [place] [address], [place], [place] [address]    Report Status PENDING  Incomplete  Culture, blood (Routine X 2) w Reflex to ID Panel     Status: None (Preliminary result)   Collection Time: 11/16/21  6:28 PM   Specimen: BLOOD RIGHT HAND  Result Value Ref Range Status   Specimen Description BLOOD RIGHT HAND  Final   Special Requests   Final    BOTTLES DRAWN AEROBIC AND ANAEROBIC Blood Culture adequate volume   Culture   Final    NO GROWTH 2 DAYS Performed at [facility], [address] [place],  [address]    Report Status PENDING  Incomplete  Blood Culture ID Panel (Reflexed)     Status: Abnormal   Collection Time: 11/16/21  6:28 PM  Result Value Ref Range Status   Enterococcus faecalis NOT DETECTED NOT DETECTED Final   Enterococcus Faecium NOT DETECTED NOT DETECTED Final   Listeria monocytogenes NOT DETECTED NOT DETECTED Final   Staphylococcus species DETECTED (A) NOT DETECTED Final    Comment: CRITICAL RESULT CALLED TO, READ BACK BY AND VERIFIED WITH: CARISSA DOLAN 11/17/21 1131 AMK    Staphylococcus aureus (BCID) DETECTED (A) NOT  DETECTED Final    Comment: CRITICAL RESULT CALLED TO, READ BACK BY AND VERIFIED WITH: CARISSA DOLAN 11/17/21 1131 AMK    Staphylococcus epidermidis NOT DETECTED NOT DETECTED Final   Staphylococcus lugdunensis NOT DETECTED NOT DETECTED Final   Streptococcus species NOT DETECTED NOT DETECTED Final   Streptococcus agalactiae NOT DETECTED NOT DETECTED  Final   Streptococcus pneumoniae NOT DETECTED NOT DETECTED Final   Streptococcus pyogenes NOT DETECTED NOT DETECTED Final   A.calcoaceticus-baumannii NOT DETECTED NOT DETECTED Final   Bacteroides fragilis NOT DETECTED NOT DETECTED Final   Enterobacterales NOT DETECTED NOT DETECTED Final   Enterobacter cloacae complex NOT DETECTED NOT DETECTED Final   Escherichia coli NOT DETECTED NOT DETECTED Final   Klebsiella aerogenes NOT DETECTED NOT DETECTED Final   Klebsiella oxytoca NOT DETECTED NOT DETECTED Final   Klebsiella pneumoniae NOT DETECTED NOT DETECTED Final   Proteus species NOT DETECTED NOT DETECTED Final   Salmonella species NOT DETECTED NOT DETECTED Final   Serratia marcescens NOT DETECTED NOT DETECTED Final   Haemophilus influenzae NOT DETECTED NOT DETECTED Final   Neisseria meningitidis NOT DETECTED NOT DETECTED Final   Pseudomonas aeruginosa NOT DETECTED NOT DETECTED Final   Stenotrophomonas maltophilia NOT DETECTED NOT DETECTED Final   Candida albicans NOT DETECTED NOT DETECTED Final   Candida auris NOT DETECTED NOT DETECTED Final   Candida glabrata NOT DETECTED NOT DETECTED Final   Candida krusei NOT DETECTED NOT DETECTED Final   Candida parapsilosis NOT DETECTED NOT DETECTED Final   Candida tropicalis NOT DETECTED NOT DETECTED Final   Cryptococcus neoformans/gattii NOT DETECTED NOT DETECTED Final   Meth resistant mecA/C and MREJ NOT DETECTED NOT DETECTED Final    Comment: Performed at [facility], [address] [place], [place] [address]  Culture, blood (Routine X 2) w Reflex to ID Panel     Status: None (Preliminary  result)   Collection Time: 11/17/21 12:28 PM   Specimen: BLOOD LEFT HAND  Result Value Ref Range Status   Specimen Description BLOOD LEFT HAND  Final   Special Requests   Final    BOTTLES DRAWN AEROBIC AND ANAEROBIC Blood Culture adequate volume   Culture   Final    NO GROWTH < 24 HOURS Performed at [facility], [address] [place], [place] [address]    Report Status PENDING  Incomplete  Culture, blood (Routine X 2) w Reflex to ID Panel     Status: None (Preliminary result)   Collection Time: 11/17/21  1:17 PM   Specimen: BLOOD RIGHT FOREARM  Result Value Ref Range Status   Specimen Description BLOOD RIGHT FOREARM  Final   Special Requests   Final    BOTTLES DRAWN AEROBIC AND ANAEROBIC Blood Culture results may not be optimal due to an inadequate volume of blood received in culture bottles   Culture   Final    NO GROWTH < 24 HOURS Performed at [facility], [address], [place],  [address]    Report Status PENDING  Incomplete    IMAGING RESULTS:   CXR - no infiltrate Ct head- no acute changes CT cervical spine- ossification of the posterior longitudinal ligament causing severe canal stenosis at c3-c4 I have personally reviewed the films ? Impression/Recommendation ?71 yr male presenting with fall and found on the floor for 8 hours  Fall - unclear cause ? Due to infection   MSSA bacteremia- very likely present on admission but blood culture not sent until 24 hrs. He has some small wounds on his toes but they dont look infected. 2 d echo done He will need TEE Would also image his lumbar spine to r/o any infection Continue cefazolin Await repeat blood culture  AKI on presentation- resolved due to rhabdo possibly  Acute traumatic rhabdomyolysis  HTN  Chronic diastolic CHF  H/o hiccups and dysphagia-  since 2019- esophageal dysmotility- followed by GI  Small lobulated masses paraspinal area on lefte T9-T10 unclear etiology ?  Check IgG4 level  Cervical stenosis with ossification of the pst longitudinal ligament  Cataract ( surgery was planned for this week) - cannot have it until infection cleared   ? ___________________________________________________ Discussed with patient, and requesting provider Note:  This document was prepared using Dragon voice recognition software and may include unintentional dictation errors.

## 2021-11-18 NOTE — Assessment & Plan Note (Signed)
Improved. -Replete potassium and magnesium as needed -Continue to monitor

## 2021-11-18 NOTE — TOC Initial Note (Signed)
Transition of Care [facility]) - Initial/Assessment Note    Patient Details  Name: John Booker MRN: [id number] Date of Birth: [birth year]  Transition of [facility]) CM/SW Contact:    Beverly Sessions, RN Phone Number: 11/18/2021, 3:46 PM  Clinical Narrative:                  Admitted [id number]  Admitted from: home alone PCP: [facility]    Therapy recommending SNF.  Patient in agreement to bed search.  Does not want [facility] or [facility].   Fl2 sent for signature Bed Search initiated        Patient Goals and CMS Choice        Expected Discharge Plan and Services                                                Prior Living Arrangements/Services                       Activities of Daily Living Home Assistive Devices/Equipment: Gilford Rile (specify type) ADL Screening (condition at time of admission) Patient's cognitive ability adequate to safely complete daily activities?: No Is the patient deaf or have difficulty hearing?: Yes Does the patient have difficulty seeing, even when wearing glasses/contacts?: No Does the patient have difficulty concentrating, remembering, or making decisions?: Yes Patient able to express need for assistance with ADLs?: Yes Does the patient have difficulty dressing or bathing?: Yes Independently performs ADLs?: No Communication: Independent Dressing (OT): Dependent Is this a change from baseline?: Change from baseline, expected to last <3days Grooming: Independent Feeding: Independent Bathing: Dependent Is this a change from baseline?: Change from baseline, expected to last <3 days Toileting: Dependent Is this a change from baseline?: Change from baseline, expected to last <3 days In/Out Bed: Dependent Is this a change from baseline?: Change from baseline, expected to last <3 days Walks in Home: Independent with device (comment) (normaly) Does the patient have difficulty walking or climbing stairs?:  Yes Weakness of Legs: Both Weakness of Arms/Hands: None  Permission Sought/Granted                  Emotional Assessment              Admission diagnosis:  Acute renal insufficiency [N28.9] Non-traumatic rhabdomyolysis [M62.82] Acute renal failure due to rhabdomyolysis ([place]) [N17.9, M62.82] Patient Active Problem List   Diagnosis Date Noted   Acute renal insufficiency    Non-traumatic rhabdomyolysis    GERD without esophagitis 11/16/2021   Hypokalemia 11/16/2021   Hyponatremia 11/16/2021   Bacteremia due to methicillin susceptible Staphylococcus aureus (MSSA) 11/16/2021   Acute renal failure due to rhabdomyolysis ([place]) 11/15/2021   Protrusion of cervical intervertebral disc 07/23/2021   Exertional dyspnea 07/04/2021   History of alcohol abuse 07/04/2021   History of tobacco abuse 07/04/2021   Chronic diastolic CHF (congestive heart failure) ([place]) 06/26/2021   Headache 06/26/2021   Low serum albumin 05/10/2021   Malnutrition of moderate degree 12/28/2020   Esophageal dysmotility 12/28/2020   Weakness 12/26/2020   COVID-19 vaccination declined 03/21/2020   Overweight (BMI 25.0-29.9) 12/20/2019   Blister of skin 11/07/2019   Neuropathy 04/04/2019   Pre-ulcerative calluses 10/07/2018   Skin lesion 10/07/2018   Plantar flexed metatarsal bone of right foot 10/07/2018   Hiatal hernia    Stomach irritation  Intractable hiccups 10/16/2017   Elevated serum creatinine 07/15/2017   Bilateral lower extremity edema 05/08/2017   Alcohol abuse 03/06/2017   Alcohol dependence in sustained full remission ([place]) 03/05/2017   Essential hypertension 11/15/2015   Vitamin D deficiency 11/15/2015   PCP:  Dion Body, MD Pharmacy:   [place], [place] [address] Phone: [phone] Fax: [phone]  [place] [address], [place] - [place] [address] Phone:  [phone] Fax: [phone]     Social Determinants of Health (SDOH) Interventions    Readmission Risk Interventions     No data to display

## 2021-11-18 NOTE — NC FL2 (Signed)
[place] LEVEL OF CARE SCREENING TOOL     IDENTIFICATION  Patient Name: John Booker Birthdate: [birth year] Sex: male Admission Date (Current Location): 11/15/2021  [facility] and [place] Number:  Engineering geologist and Address:         Provider Number: [id number]  Attending Physician Name and Address:  Lorella Nimrod, MD  Relative Name and Phone Number:       Current Level of Care: Hospital Recommended Level of Care: [place] Prior Approval Number:    Date Approved/Denied:   PASRR Number: [id number]  Discharge Plan: SNF    Current Diagnoses: Patient Active Problem List   Diagnosis Date Noted   Acute renal insufficiency    Non-traumatic rhabdomyolysis    GERD without esophagitis 11/16/2021   Hypokalemia 11/16/2021   Hyponatremia 11/16/2021   Bacteremia due to methicillin susceptible Staphylococcus aureus (MSSA) 11/16/2021   Acute renal failure due to rhabdomyolysis ([place]) 11/15/2021   Protrusion of cervical intervertebral disc 07/23/2021   Exertional dyspnea 07/04/2021   History of alcohol abuse 07/04/2021   History of tobacco abuse 07/04/2021   Chronic diastolic CHF (congestive heart failure) ([place]) 06/26/2021   Headache 06/26/2021   Low serum albumin 05/10/2021   Malnutrition of moderate degree 12/28/2020   Esophageal dysmotility 12/28/2020   Weakness 12/26/2020   COVID-19 vaccination declined 03/21/2020   Overweight (BMI 25.0-29.9) 12/20/2019   Blister of skin 11/07/2019   Neuropathy 04/04/2019   Pre-ulcerative calluses 10/07/2018   Skin lesion 10/07/2018   Plantar flexed metatarsal bone of right foot 10/07/2018   Hiatal hernia    Stomach irritation    Intractable hiccups 10/16/2017   Elevated serum creatinine 07/15/2017   Bilateral lower extremity edema 05/08/2017   Alcohol abuse 03/06/2017   Alcohol dependence in sustained full remission ([place]) 03/05/2017   Essential hypertension 11/15/2015   Vitamin D deficiency  11/15/2015    Orientation RESPIRATION BLADDER Height & Weight     Self, Place, Situation, Time  Normal Continent Weight: 100.2 kg Height:  '6\' 3"'$  (190.5 cm)  BEHAVIORAL SYMPTOMS/MOOD NEUROLOGICAL BOWEL NUTRITION STATUS      Continent Diet (heart healthy)  AMBULATORY STATUS COMMUNICATION OF NEEDS Skin   Limited Assist Verbally Skin abrasions, Bruising                       Personal Care Assistance Level of Assistance              Functional Limitations Info             SPECIAL CARE FACTORS FREQUENCY  PT (By licensed PT), OT (By licensed OT)                    Contractures Contractures Info: Not present    Additional Factors Info  Code Status, Allergies Code Status Info: dNR Allergies Info: Adalat (Nifedipine), Amlodipine           Current Medications (11/18/2021):  This is the current hospital active medication list Current Facility-Administered Medications  Medication Dose Route Frequency Provider Last Rate Last Admin   acetaminophen (TYLENOL) tablet 650 mg  650 mg Oral Q6H PRN Mansy, Jan A, MD   650 mg at 11/16/21 1633   Or   acetaminophen (TYLENOL) suppository 650 mg  650 mg Rectal Q6H PRN Mansy, Jan A, MD       aspirin EC tablet 81 mg  81 mg Oral Daily Mansy, Jan A, MD   81 mg at 11/18/21  0941   baclofen (LIORESAL) tablet 10 mg  10 mg Oral BID Mansy, Jan A, MD   10 mg at 11/18/21 0941   ceFAZolin (ANCEF) IVPB 2g/100 mL premix  2 g Intravenous Q8H Amin, Soundra Pilon, MD 200 mL/hr at 11/18/21 1331 2 g at 11/18/21 1331   chlorproMAZINE (THORAZINE) 12.5 mg in sodium chloride 0.9 % 25 mL IVPB  12.5 mg Intravenous Q6H PRN Lorella Nimrod, MD   Stopped at 11/16/21 1720   cyanocobalamin (VITAMIN B12) tablet 1,000 mcg  1,000 mcg Oral Daily Mansy, Jan A, MD   1,000 mcg at 11/18/21 0941   enoxaparin (LOVENOX) injection 50 mg  0.5 mg/kg Subcutaneous Q24H Mansy, Jan A, MD   50 mg at 11/18/21 0944   feeding supplement (ENSURE ENLIVE / ENSURE PLUS) liquid 237 mL  237 mL  Oral TID BM Mansy, Jan A, MD       labetalol (NORMODYNE) injection 10 mg  10 mg Intravenous Q2H PRN Lorella Nimrod, MD       magnesium hydroxide (MILK OF MAGNESIA) suspension 30 mL  30 mL Oral Daily PRN Mansy, Jan A, MD       morphine (PF) 2 MG/ML injection 2 mg  2 mg Intravenous Q4H PRN Mansy, Jan A, MD   2 mg at 11/17/21 0641   multivitamin with minerals tablet 1 tablet  1 tablet Oral Daily Mansy, Jan A, MD   1 tablet at 11/18/21 0941   ondansetron (ZOFRAN) tablet 4 mg  4 mg Oral Q6H PRN Mansy, Jan A, MD       Or   ondansetron [facility]) injection 4 mg  4 mg Intravenous Q6H PRN Mansy, Arvella Merles, MD       Oral care mouth rinse  15 mL Mouth Rinse PRN Lorella Nimrod, MD       oxyCODONE-acetaminophen (PERCOCET/ROXICET) 5-325 MG per tablet 1-2 tablet  1-2 tablet Oral Q4H PRN Lorella Nimrod, MD   2 tablet at 11/18/21 0941   pantoprazole (PROTONIX) EC tablet 40 mg  40 mg Oral Daily Mansy, Jan A, MD   40 mg at 11/18/21 0941   saccharomyces boulardii (FLORASTOR) capsule 250 mg  250 mg Oral BID Mansy, Jan A, MD   250 mg at 11/18/21 0941   sodium chloride 0.9 % bolus 1,000 mL  1,000 mL Intravenous Once Harvest Dark, MD       traZODone (DESYREL) tablet 25 mg  25 mg Oral QHS PRN Mansy, Jan A, MD   25 mg at 11/16/21 0111   Vitamin D (Ergocalciferol) (DRISDOL) 1.25 MG (50000 UNIT) capsule 50,000 Units  50,000 Units Oral Q7 days Mansy, Arvella Merles, MD         Discharge Medications: Please see discharge summary for a list of discharge medications.  Relevant Imaging Results:  Relevant Lab Results:   Additional Information SS# [id number]  Beverly Sessions, RN

## 2021-11-18 NOTE — Care Management Important Message (Signed)
Important Message  Patient Details  Name: John Booker MRN: [id number] Date of Birth: [birth year]   Medicare Important Message Given:  Yes     Loann Quill 11/18/2021, 3:42 PM

## 2021-11-18 NOTE — Progress Notes (Addendum)
Progress Note   Patient: John Booker PTW:[id number] DOB: [birth year] DOA: 11/15/2021     3 DOS: the patient was seen and examined on 11/18/2021   Brief hospital course: Taken from H&P.  LIEUTENANT ABARCA is a 71 y.o. male with medical history significant for hypertension, presented to emergency room because of generalized weakness lately and a fall.  The patient was too weak to get up and remained on the ground about 8 hours before he was found.  He denied any chest pain or palpitations, presyncope or syncope.  No injuries including head injuries.  No dysuria, oliguria or hematuria or flank pain.  He has mild lower extremity edema.  He admitted to diarrhea without melena or bright red bleeding per rectum.  No cough or wheezing or dyspnea.   ED Course: When he came to the ER, BP was 33/105 and heart rate 108 with respiratory rate of 24 with otherwise normal vital signs.  Labs revealed.  Labs revealed hyponatremia 129 hypochloremia of 86, hypokalemia of 2.9 and a BUN of 29 with creatinine of 2.23 and calcium of 8.6 with anion gap of 17 and AST of 98 with total bili of 2.3.  High sensitive troponin I was 34 and later 30.  Previous renal functions were within normal.  CK was significantly elevated at 3521 and CBC showed leukocytosis with neutrophilia UA showed 11-20 WBCs with rare bacteria and 100 protein with 50 glucose. EKG as reviewed by me : EKG showed supraventricular cardia with a rate of 136 without axis deviation and Q waves anteroseptally. Imaging: Chest x-ray showed no acute cardiopulmonary disease.  Head and C-spine CT showed no acute intracranial normalities and no osseous fracture or traumatic subluxation.  There was posterior longitudinal ligament medication with subsequent severe central canal stenosis at C3-4 and to lesser extent at C2-3 with multilevel foraminal stenosis.  There was stable atrophy and white matter disease.  9/9: Worsening CK level at 6455 today.  Creatinine and potassium  improving.  Gap closed.  Leukocytosis resolved. Continuing IV fluid and replacing potassium. Patient became febrile in the afternoon.  Fever of 101.1. Checking urine and blood cultures. Check procalcitonin Start him on ceftriaxone  9/10: Patient did not had any more fever.  Procalcitonin was elevated at 12.93 which improved to 6.56 this morning. Preliminary blood cultures with MSSA.  Urine cultures pending.  CK started improving after peaking at 6455, currently at 3511.  Renal function improved.  Stable hyponatremia with sodium of 131.  Antibiotics switched to cefazolin, echocardiogram ordered. We will continue IV fluid for another day. For his concern of lower back pain secondary to hospital bed, he was encouraged to get out of bed.  PT was also ordered. He was asking for Percocet and morphine together, explained to him that we can do 1 thing to help with this pain. Communicated with nursing staff to try Percocet first.  9/11: Repeat blood cultures remain negative in 24 hours. TTE with obvious vegetations.  Procalcitonin and CK improving. PT/OT are recommending SNF.    Assessment and Plan: * Acute renal failure due to rhabdomyolysis ([place]) Improved.  CK improving. Stop IV fluid and encourage p.o. hydration -Monitor CK and BMP  Bacteremia due to methicillin susceptible Staphylococcus aureus (MSSA) Blood and urine cultures were sent yesterday when he developed fever.  Elevated procalcitonin which started improving today.  Preliminary blood cultures with MSSA.  Urine cultures with Staphylococcus epidermidis. Initially received ceftriaxone and later switched to cefazolin. Repeat blood cultures negative in  24 hours. TTE was negative for any vegetations. -Continue cefazolin -ID consult -Consult cardiology for TEE   Hypokalemia Improved. -Replete potassium and magnesium as needed -Continue to monitor  Hyponatremia Stable at 131.  Might have some element of SIADH along with poor p.o.  intake. -Continue with IV fluid -Monitor sodium  Essential hypertension Blood pressure mildly elevated.  Patient was on Lasix and losartan at home which is being held due to AKI and rhabdomyolysis. Allergies noted for amlodipine. -As needed labetalol  GERD without esophagitis - Continue PPI therapy.  Intractable hiccups Patient takes baclofen at home which was resumed.  Apparently a chronic issue which has been investigated, CT chest with concern of mediastinal masses which were followed up by PET scan and they were negative for malignancy. -As needed Thorazine  Subjective: Patient was sitting in chair when seen today.  He thinks that he is improving, no new complaints.  He was little upset that nobody is letting him rest or eat peacefully as people keep coming to his room.  Physical Exam: Vitals:   11/17/21 1837 11/17/21 1930 11/18/21 0443 11/18/21 1008  BP: (!) 153/85 138/81 (!) 164/79 (!) 152/76  Pulse: (!) 104 97 94 89  Resp:  20 20   Temp:  98.1 F (36.7 C) 98.2 F (36.8 C) 98.3 F (36.8 C)  TempSrc:  Oral Oral Oral  SpO2: 98% 96% 97% 96%  Weight:      Height:       General.  Well-developed, overweight elderly man, in no acute distress. Pulmonary.  Lungs clear bilaterally, normal respiratory effort. CV.  Regular rate and rhythm, no JVD, rub or murmur. Abdomen.  Soft, nontender, nondistended, BS positive. CNS.  Alert and oriented .  No focal neurologic deficit. Extremities.  No edema, no cyanosis, pulses intact and symmetrical. Psychiatry.  Judgment and insight appears normal.  Data Reviewed: Prior data is reviewed  Family Communication: Daughter called back and able to discuss with her in detail.  Per daughter he lives alone and will not be able to take care of himself at this time and he might not even agree for rehab.  She will talk with him and let us know.  I also advised to have a POA in place.  Disposition: Status is: Inpatient Remains inpatient appropriate  because: Severity of illness   Planned Discharge Destination: Skilled nursing facility  Time spent: 45 minutes  This record has been created using Systems analyst. Errors have been sought and corrected,but may not always be located. Such creation errors do not reflect on the standard of care.  Author: Lorella Nimrod, MD 11/18/2021 2:53 PM  For on call review [email or web address].

## 2021-11-18 NOTE — Evaluation (Signed)
Physical Therapy Evaluation Patient Details Name: John Booker MRN: [id number] DOB: [birth year] Today's Date: 11/18/2021  History of Present Illness  Pt admitted for ARF secondary to rhabo. History includes HTN. Pt reports he tripped and fell going to bathroom at home and was on the floor x 8 hours, unable to get up independently. Recent hospitaliztion for similar issue  Clinical Impression  Pt is a pleasant 71 year old male who was admitted for ARF secondary to rhabdo. Pt performs bed mobility with min assist, transfers with mod assist, and ambulation with min assist and RW. Pt demonstrates deficits with strength/pain/mobility. Pt is very fearful of falling repeatedly stating he can't walk, however eventually does more than he expected. Needs assist for all mobility and would be unsafe at home alone as he is not at baseline level. Would benefit from skilled PT to address above deficits and promote optimal return to PLOF; recommend transition to STR upon discharge from acute hospitalization. Pt very clear in his decision to dc home and is not willing to consider SNF at this time.      Recommendations for follow up therapy are one component of a multi-disciplinary discharge planning process, led by the attending physician.  Recommendations may be updated based on patient status, additional functional criteria and insurance authorization.  Follow Up Recommendations Skilled nursing-short term rehab (<3 hours/day) (pt currently refusing) Can patient physically be transported by private vehicle: Yes    Assistance Recommended at Discharge Intermittent Supervision/Assistance  Patient can return home with the following  A lot of help with walking and/or transfers;A lot of help with bathing/dressing/bathroom;Help with stairs or ramp for entrance    Equipment Recommendations BSC/3in1  Recommendations for Other Services       Functional Status Assessment Patient has had a recent decline in their  functional status and demonstrates the ability to make significant improvements in function in a reasonable and predictable amount of time.     Precautions / Restrictions Precautions Precautions: Fall Restrictions Weight Bearing Restrictions: No      Mobility  Bed Mobility Overal bed mobility: Needs Assistance Bed Mobility: Supine to Sit     Supine to sit: Min assist     General bed mobility comments: needs assist for B LE and trunkal support. Once seated at EOB, upright posture noted. Reports increased LBP with movement. No dizziness    Transfers Overall transfer level: Needs assistance Equipment used: Rolling walker (2 wheels) Transfers: Sit to/from Stand Sit to Stand: Mod assist           General transfer comment: needs cues and momentum for successful transfer. Unable to stand from low bed, required elevation in addition to pillow placed in chair. Once standing, able to stand with cga    Ambulation/Gait Ambulation/Gait assistance: Min assist Gait Distance (Feet): 10 Feet Assistive device: Rolling walker (2 wheels) Gait Pattern/deviations: Step-to pattern       General Gait Details: cautious gait with high fear of falling. Short step to gait pattern and keeps RW too far away from body  Stairs            Wheelchair Mobility    Modified Rankin (Stroke Patients Only)       Balance Overall balance assessment: Needs assistance, History of Falls Sitting-balance support: Feet supported, No upper extremity supported Sitting balance-Leahy Scale: Fair     Standing balance support: Bilateral upper extremity supported Standing balance-Leahy Scale: Fair  Pertinent Vitals/Pain Pain Assessment Pain Assessment: Faces Faces Pain Scale: Hurts whole lot Pain Location: low back Pain Descriptors / Indicators: Aching, Dull, Guarding Pain Intervention(s): Limited activity within patient's tolerance, Premedicated before  session, Repositioned    Home Living Family/patient expects to be discharged to:: Private residence Living Arrangements: Alone Available Help at Discharge: Family;Available PRN/intermittently Type of Home: House Home Access: Stairs to enter Entrance Stairs-Rails: Left Entrance Stairs-Number of Steps: 5   Home Layout: One level Home Equipment: Conservation officer, nature (2 wheels) Additional Comments: reports he has family near by, however unable to provide reliable assistance. Uses RW for all mobility    Prior Function Prior Level of Function : Driving;Independent/Modified Independent;History of Falls (last six months)             Mobility Comments: reports he was previously indep and has been "awhile" since last fall. Has been able to drive       Hand Dominance        Extremity/Trunk Assessment   Upper Extremity Assessment Upper Extremity Assessment: Generalized weakness (grossly 4/5; unable to lift arms above shoulder level)    Lower Extremity Assessment Lower Extremity Assessment: Generalized weakness (edema present. B LE grossly 3+/5)       Communication   Communication: No difficulties  Cognition Arousal/Alertness: Awake/alert Behavior During Therapy: WFL for tasks assessed/performed Overall Cognitive Status: Within Functional Limits for tasks assessed                                 General Comments: unsatisfied with level of care. Offered service recovery and opporutinty to speak to leadership, pt declined. Active listening given        General Comments      Exercises Other Exercises Other Exercises: educated on benefits of mobility and expectations for rehab in acute care setting   Assessment/Plan    PT Assessment Patient needs continued PT services  PT Problem List Decreased strength;Decreased activity tolerance;Decreased balance;Decreased mobility;Cardiopulmonary status limiting activity;Pain       PT Treatment Interventions Gait  training;DME instruction;Therapeutic exercise;Balance training    PT Goals (Current goals can be found in the Care Plan section)  Acute Rehab PT Goals Patient Stated Goal: to go home PT Goal Formulation: With patient Time For Goal Achievement: 12/02/21 Potential to Achieve Goals: Good    Frequency Min 2X/week     Co-evaluation               AM-PAC PT "6 Clicks" Mobility  Outcome Measure Help needed turning from your back to your side while in a flat bed without using bedrails?: A Little Help needed moving from lying on your back to sitting on the side of a flat bed without using bedrails?: A Little Help needed moving to and from a bed to a chair (including a wheelchair)?: A Lot Help needed standing up from a chair using your arms (e.g., wheelchair or bedside chair)?: A Lot Help needed to walk in hospital room?: A Lot Help needed climbing 3-5 steps with a railing? : A Lot 6 Click Score: 14    End of Session Equipment Utilized During Treatment: Gait belt Activity Tolerance: Patient limited by pain Patient left: in chair;with chair alarm set Nurse Communication: Mobility status PT Visit Diagnosis: Muscle weakness (generalized) (M62.81);History of falling (Z91.81);Difficulty in walking, not elsewhere classified (R26.2);Pain;Unsteadiness on feet (R26.81) Pain - Right/Left:  (center) Pain - part of body:  (back)  Time: 2883-3744 PT Time Calculation (min) (ACUTE ONLY): 34 min   Charges:   PT Evaluation $PT Eval Moderate Complexity: 1 Mod PT Treatments $Gait Training: 8-22 mins        Greggory Stallion, PT, DPT, GCS [phone]   John Booker 11/18/2021, 11:41 AM

## 2021-11-18 NOTE — Progress Notes (Signed)
*  PRELIMINARY RESULTS* Echocardiogram 2D Echocardiogram has been performed.  John Booker 11/18/2021, 9:09 AM

## 2021-11-18 NOTE — Assessment & Plan Note (Signed)
Blood and urine cultures were sent yesterday when he developed fever.  Elevated procalcitonin which started improving today.  Preliminary blood cultures with MSSA.  Urine cultures with Staphylococcus epidermidis. Initially received ceftriaxone and later switched to cefazolin. Repeat blood cultures negative in 24 hours. TTE was negative for any vegetations. -Continue cefazolin -ID consult -Consult cardiology for TEE

## 2021-11-18 NOTE — Assessment & Plan Note (Signed)
Stable at 131.  Might have some element of SIADH along with poor p.o. intake. -Continue with IV fluid -Monitor sodium

## 2021-11-18 NOTE — Assessment & Plan Note (Signed)
Improved.  CK improving. Stop IV fluid and encourage p.o. hydration -Monitor CK and BMP

## 2021-11-18 NOTE — Telephone Encounter (Signed)
pt daughter called in, pt is currently in hospital due to a fall. She states that she will give a call back to have him r/s

## 2021-11-19 ENCOUNTER — Inpatient Hospital Stay: Payer: Medicare Other

## 2021-11-19 DIAGNOSIS — M6282 Rhabdomyolysis: Secondary | ICD-10-CM | POA: Diagnosis not present

## 2021-11-19 DIAGNOSIS — N179 Acute kidney failure, unspecified: Secondary | ICD-10-CM | POA: Diagnosis not present

## 2021-11-19 LAB — CULTURE, BLOOD (ROUTINE X 2)

## 2021-11-19 LAB — BASIC METABOLIC PANEL
Anion gap: 8 (ref 5–15)
BUN: 14 mg/dL (ref 8–23)
CO2: 29 mmol/L (ref 22–32)
Calcium: 8.3 mg/dL — ABNORMAL LOW (ref 8.9–10.3)
Chloride: 97 mmol/L — ABNORMAL LOW (ref 98–111)
Creatinine, Ser: 0.85 mg/dL (ref 0.61–1.24)
GFR, Estimated: >60 mL/min (ref >60)
Glucose, Bld: 119 mg/dL — ABNORMAL HIGH (ref 70–99)
Potassium: 3.1 mmol/L — ABNORMAL LOW (ref 3.5–5.1)
Sodium: 134 mmol/L — ABNORMAL LOW (ref 135–145)

## 2021-11-19 LAB — URINE CULTURE: Culture: >=100000 — AB

## 2021-11-19 LAB — MAGNESIUM: Magnesium: 2.1 mg/dL (ref 1.7–2.4)

## 2021-11-19 LAB — CK: Total CK: 772 U/L — ABNORMAL HIGH (ref 49–397)

## 2021-11-19 MED ORDER — SODIUM CHLORIDE 0.9 % IV SOLN: INTRAVENOUS | Status: DC

## 2021-11-19 MED ORDER — POTASSIUM CHLORIDE CRYS ER 20 MEQ PO TBCR
40.0000 meq | EXTENDED_RELEASE_TABLET | Freq: Once | ORAL | Status: AC
  Administered 2021-11-19: 40 meq via ORAL
  Filled 2021-11-19: qty 2

## 2021-11-19 MED ORDER — FUROSEMIDE 40 MG PO TABS
40.0000 mg | ORAL_TABLET | Freq: Every day | ORAL | Status: DC
Start: 2021-11-19 — End: 2021-11-20
  Administered 2021-11-19 – 2021-11-20 (×2): 40 mg via ORAL
  Filled 2021-11-19 (×2): qty 1

## 2021-11-19 NOTE — Progress Notes (Signed)
Consent completed for TEE and placed in patient's chart. Patient aware no food or water after midnight.   Fuller Mandril, RN

## 2021-11-19 NOTE — Progress Notes (Signed)
Patient having difficulty swallowing pills. Recommended to patient crushing with applesauce. Patient agreed to try and was able to swallow crushed medications without difficulty. Advised patient to continue having medications crushed going forward.   Fuller Mandril, RN

## 2021-11-19 NOTE — Plan of Care (Signed)
  Problem: Activity: Goal: Risk for activity intolerance will decrease Outcome: Progressing   Problem: Nutrition: Goal: Adequate nutrition will be maintained Outcome: Progressing   Problem: Coping: Goal: Level of anxiety will decrease Outcome: Not Progressing   Problem: Elimination: Goal: Will not experience complications related to bowel motility Outcome: Progressing Goal: Will not experience complications related to urinary retention Outcome: Progressing   Problem: Pain Managment: Goal: General experience of comfort will improve Outcome: Progressing   Problem: Safety: Goal: Ability to remain free from injury will improve Outcome: Progressing   Problem: Skin Integrity: Goal: Risk for impaired skin integrity will decrease Outcome: Progressing

## 2021-11-19 NOTE — TOC Progression Note (Signed)
Transition of Care [facility]) - Progression Note    Patient Details  Name: John Booker MRN: [id number] Date of Birth: 1951-01-01  [facility]) CM/SW Contact  Beverly Sessions, RN Phone Number: 11/19/2021, 3:02 PM  Clinical Narrative:      Bed offers presented.  Patient accepts bed at [facility]. Accepted bed in [place] and notified Tammy at [facility] TOC to start  insurance auth once confirmed he is nearing medical stability for discharge  With patient permission VM left for daughter to update      Expected Discharge Plan and Services                                                 Social Determinants of Health (SDOH) Interventions    Readmission Risk Interventions     No data to display

## 2021-11-19 NOTE — Assessment & Plan Note (Signed)
Blood and urine cultures were sent yesterday when he developed fever.  Elevated procalcitonin which started improving today.  Preliminary blood cultures with MSSA.  Urine cultures with Staphylococcus epidermidis. Initially received ceftriaxone and later switched to cefazolin. Repeat blood cultures negative in 24 hours. TTE was negative for any vegetations. -Continue cefazolin -ID consult -Consult cardiology for TEE-scheduled for Wednesday. -MRI lumbar spine ordered due to his complaint of lower back pain to rule out osteo

## 2021-11-19 NOTE — Progress Notes (Signed)
Physical Therapy Treatment Patient Details Name: John Booker MRN: [id number] DOB: June 06, 1950 Today's Date: 11/19/2021   History of Present Illness Pt admitted for ARF secondary to rhabo. History includes HTN. Pt reports he tripped and fell going to bathroom at home and was on the floor x 8 hours, unable to get up independently. Recent hospitaliztion for similar issue    PT Comments    Pt is making gradual progress towards goals with ability to ambulate slightly further distance this session, however still unsteady while using RW with shuffle gait pattern. Still reports back pain, however states it is slightly improved from previous date. Will continue to progress as able.   Recommendations for follow up therapy are one component of a multi-disciplinary discharge planning process, led by the attending physician.  Recommendations may be updated based on patient status, additional functional criteria and insurance authorization.  Follow Up Recommendations  Skilled nursing-short term rehab (<3 hours/day) Can patient physically be transported by private vehicle: Yes   Assistance Recommended at Discharge Intermittent Supervision/Assistance  Patient can return home with the following A lot of help with walking and/or transfers;A lot of help with bathing/dressing/bathroom;Help with stairs or ramp for entrance   Equipment Recommendations  BSC/3in1    Recommendations for Other Services       Precautions / Restrictions Precautions Precautions: Fall Restrictions Weight Bearing Restrictions: No     Mobility  Bed Mobility Overal bed mobility: Needs Assistance Bed Mobility: Supine to Sit     Supine to sit: Min assist     General bed mobility comments: improved technique with ability to rise quicker than previous session. Once seated at EOB, upright posture noted. Slight assist for B LE management    Transfers Overall transfer level: Needs assistance Equipment used: Rolling walker  (2 wheels) Transfers: Sit to/from Stand Sit to Stand: Min assist           General transfer comment: improved technique with required bed elevated (slightly). Once standing, RW used    Ambulation/Gait Ambulation/Gait assistance: Min Web designer (Feet): 60 Feet Assistive device: Rolling walker (2 wheels) Gait Pattern/deviations: Step-to pattern       General Gait Details: shuffle gait pattern with short step to gait. RW used and fatigues quickly with 2 standing rest breaks. Cues for sequencing   Stairs             Wheelchair Mobility    Modified Rankin (Stroke Patients Only)       Balance Overall balance assessment: Needs assistance, History of Falls Sitting-balance support: Feet supported, No upper extremity supported Sitting balance-Leahy Scale: Good     Standing balance support: Bilateral upper extremity supported Standing balance-Leahy Scale: Fair                              Cognition Arousal/Alertness: Awake/alert Behavior During Therapy: WFL for tasks assessed/performed Overall Cognitive Status: Within Functional Limits for tasks assessed                                 General Comments: Pt reports pain with mobility, however agreeable to participate        Exercises Other Exercises Other Exercises: seated ther-ex performed including LAQ, hip add squeezes, and alt marching. 15 reps performed with cga.    General Comments        Pertinent Vitals/Pain Pain Assessment Pain Assessment: Faces  Faces Pain Scale: Hurts even more Pain Location: low back Pain Descriptors / Indicators: Aching Pain Intervention(s): Limited activity within patient's tolerance, Repositioned    Home Living                          Prior Function            PT Goals (current goals can now be found in the care plan section) Acute Rehab PT Goals Patient Stated Goal: to go home PT Goal Formulation: With patient Time  For Goal Achievement: 12/02/21 Potential to Achieve Goals: Good Progress towards PT goals: Progressing toward goals    Frequency    Min 2X/week      PT Plan Current plan remains appropriate    Co-evaluation              AM-PAC PT "6 Clicks" Mobility   Outcome Measure  Help needed turning from your back to your side while in a flat bed without using bedrails?: A Little Help needed moving from lying on your back to sitting on the side of a flat bed without using bedrails?: A Little Help needed moving to and from a bed to a chair (including a wheelchair)?: A Little Help needed standing up from a chair using your arms (e.g., wheelchair or bedside chair)?: A Little Help needed to walk in hospital room?: A Little Help needed climbing 3-5 steps with a railing? : A Lot 6 Click Score: 17    End of Session Equipment Utilized During Treatment: Gait belt Activity Tolerance: Patient limited by pain Patient left: in bed;with bed alarm set (declined to sit in recliner) Nurse Communication: Mobility status PT Visit Diagnosis: Muscle weakness (generalized) (M62.81);History of falling (Z91.81);Difficulty in walking, not elsewhere classified (R26.2);Pain;Unsteadiness on feet (R26.81) Pain - Right/Left:  (midline) Pain - part of body:  (back)     Time: 1219-7588 PT Time Calculation (min) (ACUTE ONLY): 23 min  Charges:  $Gait Training: 8-22 mins $Therapeutic Exercise: 8-22 mins                     Greggory Stallion, PT, DPT, [facility] [phone]    Faythe Heitzenrater 11/19/2021, 1:32 PM

## 2021-11-19 NOTE — Assessment & Plan Note (Signed)
Improved.  CK improving. IV fluid was discontinued yesterday. -Encourage p.o. hydration -Monitor CK and BMP

## 2021-11-19 NOTE — Progress Notes (Signed)
Transport here to take patient down for MRI. Patient stated that he was not told about MRI. Explained to patient why test was ordered. Patient very grumpy. Patient refused MRI tonight and asked to have test done tomorrow .

## 2021-11-19 NOTE — Progress Notes (Signed)
Progress Note   Patient: John Booker JKK:[id number] DOB: April 04, 1950 DOA: 11/15/2021     4 DOS: the patient was seen and examined on 11/19/2021   Brief hospital course: Taken from H&P.  BRESLIN HEMANN is a 71 y.o. male with medical history significant for hypertension, presented to emergency room because of generalized weakness lately and a fall.  The patient was too weak to get up and remained on the ground about 8 hours before he was found.  He denied any chest pain or palpitations, presyncope or syncope.  No injuries including head injuries.  No dysuria, oliguria or hematuria or flank pain.  He has mild lower extremity edema.  He admitted to diarrhea without melena or bright red bleeding per rectum.  No cough or wheezing or dyspnea.   ED Course: When he came to the ER, BP was 33/105 and heart rate 108 with respiratory rate of 24 with otherwise normal vital signs.  Labs revealed.  Labs revealed hyponatremia 129 hypochloremia of 86, hypokalemia of 2.9 and a BUN of 29 with creatinine of 2.23 and calcium of 8.6 with anion gap of 17 and AST of 98 with total bili of 2.3.  High sensitive troponin I was 34 and later 30.  Previous renal functions were within normal.  CK was significantly elevated at 3521 and CBC showed leukocytosis with neutrophilia UA showed 11-20 WBCs with rare bacteria and 100 protein with 50 glucose. EKG as reviewed by me : EKG showed supraventricular cardia with a rate of 136 without axis deviation and Q waves anteroseptally. Imaging: Chest x-ray showed no acute cardiopulmonary disease.  Head and C-spine CT showed no acute intracranial normalities and no osseous fracture or traumatic subluxation.  There was posterior longitudinal ligament medication with subsequent severe central canal stenosis at C3-4 and to lesser extent at C2-3 with multilevel foraminal stenosis.  There was stable atrophy and white matter disease.  9/9: Worsening CK level at 6455 today.  Creatinine and potassium  improving.  Gap closed.  Leukocytosis resolved. Continuing IV fluid and replacing potassium. Patient became febrile in the afternoon.  Fever of 101.1. Checking urine and blood cultures. Check procalcitonin Start him on ceftriaxone  9/10: Patient did not had any more fever.  Procalcitonin was elevated at 12.93 which improved to 6.56 this morning. Preliminary blood cultures with MSSA.  Urine cultures pending.  CK started improving after peaking at 6455, currently at 3511.  Renal function improved.  Stable hyponatremia with sodium of 131.  Antibiotics switched to cefazolin, echocardiogram ordered. We will continue IV fluid for another day. For his concern of lower back pain secondary to hospital bed, he was encouraged to get out of bed.  PT was also ordered. He was asking for Percocet and morphine together, explained to him that we can do 1 thing to help with this pain. Communicated with nursing staff to try Percocet first.  9/11: Repeat blood cultures remain negative in 24 hours. TTE with obvious vegetations.  Procalcitonin and CK improving. PT/OT are recommending SNF.  9/12: Renal function has been normalized.  Repeat blood cultures remain negative in 2 days.  CK continued to improve, currently at 772.  Potassium at 3.1 Taking magnesium and repleting potassium. Patient agrees to go to SNF-TOC started work-up. TEE on Wednesday. MRI of lumbar spine ordered for concern of lower back pain to rule out osteomyelitis   Assessment and Plan: * Acute renal failure due to rhabdomyolysis (HCC) Improved.  CK improving. IV fluid was discontinued yesterday. -  Encourage p.o. hydration -Monitor CK and BMP  Bacteremia due to methicillin susceptible Staphylococcus aureus (MSSA) Blood and urine cultures were sent yesterday when he developed fever.  Elevated procalcitonin which started improving today.  Preliminary blood cultures with MSSA.  Urine cultures with Staphylococcus epidermidis. Initially  received ceftriaxone and later switched to cefazolin. Repeat blood cultures negative in 24 hours. TTE was negative for any vegetations. -Continue cefazolin -ID consult -Consult cardiology for TEE-scheduled for Wednesday. -MRI lumbar spine ordered due to his complaint of lower back pain to rule out osteo   Hypokalemia Improved. -Replete potassium and magnesium as needed -Continue to monitor  Hyponatremia Stable at 131.  Might have some element of SIADH along with poor p.o. intake. -Continue with IV fluid -Monitor sodium  Essential hypertension Blood pressure mildly elevated.  Patient was on Lasix and losartan at home which is being held due to AKI and rhabdomyolysis. Allergies noted for amlodipine. -As needed labetalol  GERD without esophagitis - Continue PPI therapy.  Intractable hiccups Patient takes baclofen at home which was resumed.  Apparently a chronic issue which has been investigated, CT chest with concern of mediastinal masses which were followed up by PET scan and they were negative for malignancy. -As needed Thorazine   Subjective: Patient is feeling little improved when seen today.  Intermittently having lower back pain.  No other concerns.  Agrees to go to rehab.  Physical Exam: Vitals:   11/18/21 1902 11/18/21 2030 11/18/21 2147 11/19/21 0607  BP: (!) 175/86 (!) 167/88 (!) 168/81 (!) 160/84  Pulse: 98 94 90 90  Resp:  16  16  Temp: 98.3 F (36.8 C) 98.9 F (37.2 C)  98.1 F (36.7 C)  TempSrc: Oral   Oral  SpO2: 100% 98% 97% 100%  Weight:      Height:       General.  Hard of hearing elderly man, in no acute distress. Pulmonary.  Lungs clear bilaterally, normal respiratory effort. CV.  Regular rate and rhythm, no JVD, rub or murmur. Abdomen.  Soft, nontender, nondistended, BS positive. CNS.  Alert and oriented .  No focal neurologic deficit. Extremities.  No edema, no cyanosis, pulses intact and symmetrical. Psychiatry.  Judgment and insight appears  normal.  Data Reviewed: Prior data reviewed  Family Communication:   Disposition: Status is: Inpatient Remains inpatient appropriate because: Severity of illness   Planned Discharge Destination: Skilled nursing facility  Time spent: 45 minutes  This record has been created using Systems analyst. Errors have been sought and corrected,but may not always be located. Such creation errors do not reflect on the standard of care.  Author: Lorella Nimrod, MD 11/19/2021 3:22 PM  For on call review [email or web address].

## 2021-11-20 ENCOUNTER — Inpatient Hospital Stay
Admit: 2021-11-20 | Discharge: 2021-11-20 | Disposition: A | Discharge status: Home / Self Care | Payer: Medicare Other | Attending: Cardiology | Admitting: Cardiology

## 2021-11-20 ENCOUNTER — Encounter: Payer: Self-pay | Admitting: Family Medicine

## 2021-11-20 ENCOUNTER — Encounter
Admission: EM | Disposition: A | Discharge status: Skilled Nursing Facility | Payer: Self-pay | Source: Home / Self Care | Attending: Internal Medicine

## 2021-11-20 ENCOUNTER — Inpatient Hospital Stay: Payer: Self-pay

## 2021-11-20 ENCOUNTER — Inpatient Hospital Stay: Payer: Medicare Other

## 2021-11-20 ENCOUNTER — Telehealth: Payer: Medicare Other | Admitting: Oncology

## 2021-11-20 DIAGNOSIS — R222 Localized swelling, mass and lump, trunk: Secondary | ICD-10-CM | POA: Insufficient documentation

## 2021-11-20 DIAGNOSIS — M5000 Cervical disc disorder with myelopathy, unspecified cervical region: Secondary | ICD-10-CM | POA: Diagnosis not present

## 2021-11-20 DIAGNOSIS — L97511 Non-pressure chronic ulcer of other part of right foot limited to breakdown of skin: Secondary | ICD-10-CM | POA: Diagnosis not present

## 2021-11-20 DIAGNOSIS — M79675 Pain in left toe(s): Secondary | ICD-10-CM | POA: Diagnosis not present

## 2021-11-20 DIAGNOSIS — B351 Tinea unguium: Secondary | ICD-10-CM | POA: Diagnosis not present

## 2021-11-20 DIAGNOSIS — I330 Acute and subacute infective endocarditis: Secondary | ICD-10-CM | POA: Diagnosis not present

## 2021-11-20 DIAGNOSIS — R7881 Bacteremia: Secondary | ICD-10-CM | POA: Diagnosis not present

## 2021-11-20 DIAGNOSIS — Z79899 Other long term (current) drug therapy: Secondary | ICD-10-CM | POA: Diagnosis not present

## 2021-11-20 DIAGNOSIS — B9561 Methicillin susceptible Staphylococcus aureus infection as the cause of diseases classified elsewhere: Secondary | ICD-10-CM | POA: Diagnosis not present

## 2021-11-20 DIAGNOSIS — I2510 Atherosclerotic heart disease of native coronary artery without angina pectoris: Secondary | ICD-10-CM | POA: Diagnosis not present

## 2021-11-20 DIAGNOSIS — M0008 Staphylococcal arthritis, vertebrae: Secondary | ICD-10-CM | POA: Diagnosis not present

## 2021-11-20 DIAGNOSIS — R066 Hiccough: Secondary | ICD-10-CM | POA: Diagnosis not present

## 2021-11-20 DIAGNOSIS — N289 Disorder of kidney and ureter, unspecified: Secondary | ICD-10-CM | POA: Diagnosis not present

## 2021-11-20 DIAGNOSIS — A4101 Sepsis due to Methicillin susceptible Staphylococcus aureus: Secondary | ICD-10-CM | POA: Diagnosis not present

## 2021-11-20 DIAGNOSIS — M545 Low back pain, unspecified: Secondary | ICD-10-CM | POA: Diagnosis not present

## 2021-11-20 DIAGNOSIS — I5032 Chronic diastolic (congestive) heart failure: Secondary | ICD-10-CM | POA: Diagnosis not present

## 2021-11-20 DIAGNOSIS — M0080 Arthritis due to other bacteria, unspecified joint: Secondary | ICD-10-CM | POA: Diagnosis not present

## 2021-11-20 DIAGNOSIS — L600 Ingrowing nail: Secondary | ICD-10-CM | POA: Diagnosis not present

## 2021-11-20 DIAGNOSIS — N179 Acute kidney failure, unspecified: Secondary | ICD-10-CM | POA: Diagnosis not present

## 2021-11-20 DIAGNOSIS — Z736 Limitation of activities due to disability: Secondary | ICD-10-CM | POA: Diagnosis not present

## 2021-11-20 DIAGNOSIS — E871 Hypo-osmolality and hyponatremia: Secondary | ICD-10-CM | POA: Diagnosis not present

## 2021-11-20 DIAGNOSIS — W19XXXD Unspecified fall, subsequent encounter: Secondary | ICD-10-CM | POA: Diagnosis not present

## 2021-11-20 DIAGNOSIS — M79674 Pain in right toe(s): Secondary | ICD-10-CM | POA: Diagnosis not present

## 2021-11-20 DIAGNOSIS — E876 Hypokalemia: Secondary | ICD-10-CM | POA: Diagnosis not present

## 2021-11-20 DIAGNOSIS — I10 Essential (primary) hypertension: Secondary | ICD-10-CM | POA: Diagnosis not present

## 2021-11-20 DIAGNOSIS — M6281 Muscle weakness (generalized): Secondary | ICD-10-CM | POA: Diagnosis not present

## 2021-11-20 DIAGNOSIS — E569 Vitamin deficiency, unspecified: Secondary | ICD-10-CM | POA: Diagnosis not present

## 2021-11-20 DIAGNOSIS — R5383 Other fatigue: Secondary | ICD-10-CM | POA: Diagnosis not present

## 2021-11-20 DIAGNOSIS — G062 Extradural and subdural abscess, unspecified: Secondary | ICD-10-CM | POA: Diagnosis not present

## 2021-11-20 DIAGNOSIS — M6259 Muscle wasting and atrophy, not elsewhere classified, multiple sites: Secondary | ICD-10-CM | POA: Diagnosis not present

## 2021-11-20 DIAGNOSIS — K219 Gastro-esophageal reflux disease without esophagitis: Secondary | ICD-10-CM | POA: Diagnosis not present

## 2021-11-20 HISTORY — PX: TEE WITHOUT CARDIOVERSION: SHX5443

## 2021-11-20 LAB — CK: Total CK: 348 U/L (ref 49–397)

## 2021-11-20 LAB — BASIC METABOLIC PANEL
Anion gap: 7 (ref 5–15)
BUN: 12 mg/dL (ref 8–23)
CO2: 31 mmol/L (ref 22–32)
Calcium: 8.5 mg/dL — ABNORMAL LOW (ref 8.9–10.3)
Chloride: 97 mmol/L — ABNORMAL LOW (ref 98–111)
Creatinine, Ser: 0.82 mg/dL (ref 0.61–1.24)
GFR, Estimated: >60 mL/min (ref >60)
Glucose, Bld: 95 mg/dL (ref 70–99)
Potassium: 3.1 mmol/L — ABNORMAL LOW (ref 3.5–5.1)
Sodium: 135 mmol/L (ref 135–145)

## 2021-11-20 LAB — MAGNESIUM: Magnesium: 1.9 mg/dL (ref 1.7–2.4)

## 2021-11-20 LAB — C-REACTIVE PROTEIN: CRP: 20.3 mg/dL — ABNORMAL HIGH (ref <1.0)

## 2021-11-20 LAB — SEDIMENTATION RATE: Sed Rate: 61 mm/hr — ABNORMAL HIGH (ref 0–20)

## 2021-11-20 SURGERY — ECHOCARDIOGRAM, TRANSESOPHAGEAL
Anesthesia: Moderate Sedation

## 2021-11-20 MED ORDER — MIDAZOLAM HCL 2 MG/2ML IJ SOLN
INTRAMUSCULAR | Status: AC | PRN
  Administered 2021-11-20 (×2): 1 mg via INTRAVENOUS

## 2021-11-20 MED ORDER — CEFAZOLIN IV (FOR PTA / DISCHARGE USE ONLY): 2.0000 g | Freq: Three times a day (TID) | INTRAVENOUS | 0 refills | Status: DC

## 2021-11-20 MED ORDER — MIDAZOLAM HCL 2 MG/2ML IJ SOLN
INTRAMUSCULAR | Status: AC
  Filled 2021-11-20: qty 2

## 2021-11-20 MED ORDER — MIDAZOLAM HCL 2 MG/2ML IJ SOLN
INTRAMUSCULAR | Status: AC | PRN
  Administered 2021-11-20: 2 mg via INTRAVENOUS

## 2021-11-20 MED ORDER — FENTANYL CITRATE (PF) 100 MCG/2ML IJ SOLN
INTRAMUSCULAR | Status: AC
  Filled 2021-11-20: qty 2

## 2021-11-20 MED ORDER — BUTAMBEN-TETRACAINE-BENZOCAINE 2-2-14 % EX AERO
INHALATION_SPRAY | CUTANEOUS | Status: AC
  Filled 2021-11-20: qty 5

## 2021-11-20 MED ORDER — SODIUM CHLORIDE 0.9% FLUSH: 10.0000 mL | Freq: Two times a day (BID) | INTRAVENOUS | Status: DC

## 2021-11-20 MED ORDER — POTASSIUM CHLORIDE 10 MEQ/100ML IV SOLN
10.0000 meq | INTRAVENOUS | Status: AC
  Administered 2021-11-20 (×5): 10 meq via INTRAVENOUS
  Filled 2021-11-20 (×4): qty 100

## 2021-11-20 MED ORDER — LOSARTAN POTASSIUM 50 MG PO TABS
100.0000 mg | ORAL_TABLET | Freq: Every day | ORAL | Status: DC
  Administered 2021-11-20: 100 mg via ORAL
  Filled 2021-11-20: qty 2

## 2021-11-20 MED ORDER — LIDOCAINE VISCOUS HCL 2 % MT SOLN
OROMUCOSAL | Status: AC
  Filled 2021-11-20: qty 15

## 2021-11-20 MED ORDER — CHLORHEXIDINE GLUCONATE CLOTH 2 % EX PADS: 6.0000 | MEDICATED_PAD | Freq: Every day | CUTANEOUS | Status: DC

## 2021-11-20 MED ORDER — SODIUM CHLORIDE 0.9% FLUSH: 10.0000 mL | INTRAVENOUS | Status: DC | PRN

## 2021-11-20 MED ORDER — SODIUM CHLORIDE FLUSH 0.9 % IV SOLN
INTRAVENOUS | Status: AC
  Filled 2021-11-20: qty 10

## 2021-11-20 MED ORDER — POTASSIUM CHLORIDE CRYS ER 20 MEQ PO TBCR: 40.0000 meq | EXTENDED_RELEASE_TABLET | Freq: Every day | ORAL | 0 refills | Status: DC

## 2021-11-20 MED ORDER — FENTANYL CITRATE PF 50 MCG/ML IJ SOSY
PREFILLED_SYRINGE | INTRAMUSCULAR | Status: AC | PRN
  Administered 2021-11-20: 50 ug via INTRAVENOUS
  Administered 2021-11-20: 25 ug

## 2021-11-20 NOTE — Progress Notes (Signed)
Peripherally Inserted Central Catheter Placement  The IV Nurse has discussed with the patient and/or persons authorized to consent for the patient, the purpose of this procedure and the potential benefits and risks involved with this procedure.  The benefits include less needle sticks, lab draws from the catheter, and the patient may be discharged home with the catheter. Risks include, but not limited to, infection, bleeding, blood clot (thrombus formation), and puncture of an artery; nerve damage and irregular heartbeat and possibility to perform a PICC exchange if needed/ordered by physician.  Alternatives to this procedure were also discussed.  Bard Power PICC patient education guide, fact sheet on infection prevention and patient information card has been provided to patient /or left at bedside.    PICC Placement Documentation  PICC Single Lumen 79/72/82 Right Basilic 41 cm 0 cm (Active)  Indication for Insertion or Continuance of Line Prolonged intravenous therapies 11/20/21 1600  Exposed Catheter (cm) 0 cm 11/20/21 1600  Site Assessment Clean, Dry, Intact 11/20/21 1600  Line Status Flushed;Saline locked;Blood return noted 11/20/21 1600  Dressing Type Transparent;Securing device 11/20/21 1600  Dressing Status Antimicrobial disc in place 11/20/21 1600  Safety Lock Not Applicable 08/09/54 1537  Line Care Connections checked and tightened 11/20/21 1600  Line Adjustment (NICU/IV Team Only) No 11/20/21 1600  Dressing Intervention New dressing 11/20/21 1600  Dressing Change Due 11/27/21 11/20/21 1600       [place] 11/20/2021, 4:08 PM

## 2021-11-20 NOTE — Progress Notes (Deleted)
[place]  [phone] [phone]) [phone]  ID: John Booker OB: [birth year]  MR#: [id number]  CZY#:[id number]  Patient Care Team: Dion Body, MD as PCP - General (Family Medicine)  I connected with John Booker on 11/20/21 at  3:45 PM EDT by {Blank single:19197::"video enabled telemedicine visit","telephone visit"} and verified that I am speaking with the correct person using two identifiers.   I discussed the limitations, risks, security and privacy concerns of performing an evaluation and management service by telemedicine and the availability of in-person appointments. I also discussed with the patient that there may be a patient responsible charge related to this service. The patient expressed understanding and agreed to proceed.   Other persons participating in the visit and their role in the encounter: Patient, MD.  Patient's location: Home. Provider's location: Clinic.  CHIEF COMPLAINT: Benign paraspinal mass.  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  Other than patient's hiccups, he feels well.  He has no neurologic complaints.  He denies any recent fevers or illnesses.  He has a good appetite and denies weight loss.  He has no chest pain, shortness of breath, cough, or hemoptysis.  He denies any nausea, vomiting, constipation, or diarrhea.  He has no urinary complaints.  Patient offers no further specific complaints today.  REVIEW OF SYSTEMS:   Review of Systems  Constitutional: Negative.  Negative for fever, malaise/fatigue and weight loss.  Respiratory: Negative.  Negative for cough, hemoptysis and shortness of breath.   Cardiovascular: Negative.  Negative for chest pain and leg swelling.  Gastrointestinal: Negative.  Negative for abdominal pain.  Genitourinary: Negative.  Negative for  dysuria.  Musculoskeletal: Negative.  Negative for back pain.  Skin: Negative.  Negative for rash.  Neurological: Negative.  Negative for dizziness, focal weakness, weakness and headaches.  Psychiatric/Behavioral: Negative.  The patient is not nervous/anxious.     As per HPI. Otherwise, a complete review of systems is negative.  PAST MEDICAL HISTORY: Past Medical History:  Diagnosis Date   Hypertension     PAST SURGICAL HISTORY: Past Surgical History:  Procedure Laterality Date   ESOPHAGOGASTRODUODENOSCOPY (EGD) WITH PROPOFOL N/A 05/26/2018   Procedure: ESOPHAGOGASTRODUODENOSCOPY (EGD) WITH PROPOFOL;  Surgeon: Virgel Manifold, MD;  Location: [facility] ENDOSCOPY;  Service: Endoscopy;  Laterality: N/A;   ESOPHAGOGASTRODUODENOSCOPY (EGD) WITH PROPOFOL N/A 12/28/2020   Procedure: ESOPHAGOGASTRODUODENOSCOPY (EGD) WITH PROPOFOL;  Surgeon: Annamaria Helling, DO;  Location: [facility] ENDOSCOPY;  Service: Gastroenterology;  Laterality: N/A;   ESOPHAGOGASTRODUODENOSCOPY (EGD) WITH PROPOFOL N/A 10/09/2021   Procedure: ESOPHAGOGASTRODUODENOSCOPY (EGD) WITH PROPOFOL;  Surgeon: [name], MD;  Location: [facility] ENDOSCOPY;  Service: Gastroenterology;  Laterality: N/A;   INCISION AND DRAINAGE PERIRECTAL ABSCESS N/A 11/19/2015   Procedure: IRRIGATION AND DEBRIDEMENT PERIRECTAL ABSCESS and debridement of scrotal abscess;  Surgeon: Florene Glen, MD;  Location: [facility] ORS;  Service: General;  Laterality: N/A;   INCISION AND DRAINAGE PERIRECTAL ABSCESS N/A 11/20/2015   Procedure: IRRIGATION AND DEBRIDEMENT PERIRECTAL ABSCESS / WITH DRESSING CHANGE;  Surgeon: Florene Glen, MD;  Location: [facility] ORS;  Service: General;  Laterality: N/A;  peri rectal site   INCISION AND DRAINAGE PERIRECTAL ABSCESS N/A 11/21/2015   Procedure: IRRIGATION AND DEBRIDEMENT PERIRECTAL ABSCESS WITH DRESSING CHANGE;  Surgeon: Florene Glen, MD;  Location: [facility] ORS;  Service: General;  Laterality: N/A;   [place]  HISTORY: Family History  Problem Relation Age of Onset   Hypertension Father     ADVANCED DIRECTIVES (Y/N):  N  HEALTH MAINTENANCE: Social History   Tobacco Use   Smoking status: Former    Packs/day: 0.50    Years: 38.00    Total pack years: 19.00    Types: Cigarettes   Smokeless tobacco: Former  Scientific laboratory technician Use: Never used  Substance Use Topics   Alcohol use: Not Currently    Comment: several months,[place]   Drug use: No     Colonoscopy:  PAP:  Bone density:  Lipid panel:  Allergies  Allergen Reactions   Adalat [Nifedipine]     Foot swelling.    Amlodipine Other (See Comments)    Leg swelling    Current Outpatient Medications  Medication Sig Dispense Refill   acetaminophen (TYLENOL) 325 MG tablet Take 2 tablets (650 mg total) by mouth every 6 (six) hours as needed for mild pain or fever. 60 tablet 0   aspirin EC 81 MG EC tablet Take 1 tablet (81 mg total) by mouth daily. Swallow whole. 30 tablet 11   baclofen (LIORESAL) 10 MG tablet Take 1 tablet (10 mg total) by mouth 2 (two) times daily. 180 each 3   ceFAZolin (ANCEF) IVPB Inject 2 g into the vein every 8 (eight) hours. Indication:  MSSA bacteremia and possible paraspinal abscess Last Day of Therapy:  12/28/2021 Labs - Once weekly:  CBC/D, CMP, ESR and CRP Please pull PIC at completion of IV antibiotics Fax weekly lab results  promptly to [phone] Method of administration: IV Push Method of administration may be changed at the discretion of home infusion pharmacist based upon assessment of the patient and/or caregiver's ability to self-administer the medication ordered. 38 Units 0   feeding supplement (ENSURE ENLIVE / ENSURE PLUS) LIQD Take 237 mLs by mouth 3 (three) times daily between meals. 237 mL 12   furosemide (LASIX) 40 MG tablet Take 40 mg by mouth daily.     losartan (COZAAR) 100 MG tablet Take 1 tablet (100 mg total) by mouth daily. 90 tablet 1   Multiple Vitamin  (MULTIVITAMIN WITH MINERALS) TABS tablet Take 1 tablet by mouth daily.     omeprazole (PRILOSEC) 20 MG capsule Take 20 mg by mouth in the morning and at bedtime.     [START ON 11/21/2021] potassium chloride SA (KLOR-CON M) 20 MEQ tablet Take 2 tablets (40 mEq total) by mouth daily for 3 days. 6 tablet 0   sildenafil (REVATIO) 20 MG tablet Take 1-5 pills about 30 min prior to sex. Start with 1 and increase as needed. 30 tablet 3   vitamin B-12 1000 MCG tablet Take 1 tablet (1,000 mcg total) by mouth daily. 30 tablet 1   No current facility-administered medications for this visit.    OBJECTIVE: There were no vitals filed for this visit.    There is no height or weight on file to calculate BMI.    ECOG FS:0 - Asymptomatic  General: Well-developed, well-nourished, no acute distress. Eyes: Pink conjunctiva, anicteric sclera. HEENT: Normocephalic, moist mucous membranes. Lungs: No audible wheezing or coughing. Heart: Regular rate and rhythm. Abdomen: Soft, nontender, no obvious distention. Musculoskeletal: No edema, cyanosis, or clubbing. Neuro: Alert, answering all questions appropriately. Cranial nerves grossly intact. Skin: No rashes or petechiae noted. Psych: Normal affect. Lymphatics: No cervical, calvicular, axillary or inguinal LAD.   LAB RESULTS:  Lab Results  Component Value Date   NA  135 11/20/2021   K 3.1 (L) 11/20/2021   CL 97 (L) 11/20/2021   CO2 31 11/20/2021   GLUCOSE 95 11/20/2021   BUN 12 11/20/2021   CREATININE 0.82 11/20/2021   CALCIUM 8.5 (L) 11/20/2021   PROT 6.7 11/15/2021   ALBUMIN 3.6 11/15/2021   AST 98 (H) 11/15/2021   ALT 27 11/15/2021   ALKPHOS 65 11/15/2021   BILITOT 2.3 (H) 11/15/2021   GFRNONAA >60 11/20/2021   GFRAA 85 12/20/2019    Lab Results  Component Value Date   WBC 8.6 11/17/2021   NEUTROABS 7.2 11/17/2021   HGB 14.4 11/17/2021   HCT 42.3 11/17/2021   MCV 95.5 11/17/2021   PLT 147 (L) 11/17/2021     STUDIES: [place] EKG SITE  RITE  Result Date: 11/20/2021 If Site Rite image not attached, placement could not be confirmed due to current cardiac rhythm.  MR LUMBAR SPINE WO CONTRAST  Result Date: 11/20/2021 CLINICAL DATA:  Low back pain infection suspected. EXAM: MRI LUMBAR SPINE WITHOUT CONTRAST TECHNIQUE: Multiplanar, multisequence MR imaging of the lumbar spine was performed. No intravenous contrast was administered. COMPARISON:  PET-CT 11/12/2021 FINDINGS: Segmentation: The last well-formed disc space is designated as L5-S1 with a small rudimentary disc space at S1-S2. Congenitally small AP dimension of the spinal canal. Alignment: Physiologic. Vertebrae: No evidence of vertebral body height loss or endplate erosion. There are scattered degenerative reactive marrow changes. Conus medullaris and cauda equina: Conus extends to the L1 level. Conus and cauda equina appear normal. Paraspinal and other soft tissues: There is soft tissue edema within the paraspinal musculature the S1 vertebral body level with 2 small fluid collections adjacent to the L5-S1 facet joints, measuring up to 0 9 x 2.0 cm on the left (series 7, image 13) and 1.4 x 1.8 cm on the right (series 7, image 5-6). There is also surrounding soft tissue edema adjacent to these fluid collections. These collections are not visualized on PET-CT dated 11/12/2021 Disc levels: T12-L1: No significant spinal stenosis. L1-L2: Mild bilateral facet degenerative change. No spinal canal stenosis. No foraminal stenosis. L2-L3: Mild bilateral facet degenerative change. No spinal canal or foraminal stenosis. L3-L4: Mild bilateral facet degenerative change. No spinal canal or foraminal stenosis. L4-L5: Mild epidural lipomatosis. Moderate to severe bilateral facet degenerative change. Ligamentum flavum hypertrophy. Mild bony spinal canal stenosis. Moderate narrowing of the thecal sac. L5-S1: Small disc bulge with an annular fissure. Ligamentum flavum hypertrophy. Epidural lipomatosis. Mild  bony spinal canal narrowing. Moderate narrowing of the thecal sac. IMPRESSION: 1. There are two small soft tissue fluid collections arising from the bilateral L5-S1 facet joints with surrounding soft tissue edema. The collection on the right measures 1.4 x 1.8 cm and the collection on the left measures 0.9 x 2.0 cm. There is no evidence of marrow edema or osseous destruction at the adjacent facet joints. Findings could be degenerative or reactive in nature, but given patient's history of bacteremia and elevated inflammatory markers, early septic arthritis is difficult to exclude. If clinically indicated, this could be further assessed with a contrast enhanced lumbar spine MRI. 2. Lower lumbar spine predominant degenerative disc and facet disease without evidence of high grade bony spinal canal stenosis. Electronically Signed   By: Marin Roberts M.D.   On: 11/20/2021 09:15   ECHO TEE  Result Date: 11/20/2021    TRANSESOPHOGEAL ECHO REPORT   Patient Name:   John Booker Date of Exam: 11/20/2021 Medical Rec #:  [id number]  Height:       75.0 in Accession #:    [id number]     Weight:       220.9 lb Date of Birth:  Jul 12, 1950      BSA:          2.289 m Patient Age:    64 years       BP:           179/83 mmHg Patient Gender: M              HR:           85 bpm. Exam Location:  [facility] Procedure: Transesophageal Echo, Cardiac Doppler, Color Doppler and Saline            Contrast Bubble Study Indications:     Bacteremia R78.81  History:         Patient has prior history of Echocardiogram examinations, most                  recent 11/18/2021. Risk Factors:Hypertension.  Sonographer:     Sherrie Sport Referring Phys:  Alanson Puls TANG Diagnosing Phys: Serafina Royals MD PROCEDURE: The transesophogeal probe was passed without difficulty through the esophogus of the patient. Sedation performed by performing physician. The patient developed no complications during the procedure. IMPRESSIONS  1. Left ventricular ejection  fraction, by estimation, is 60 to 65%. The left ventricle has normal function.  2. Right ventricular systolic function is normal. The right ventricular size is normal.  3. No left atrial/left atrial appendage thrombus was detected.  4. The mitral valve is normal in structure. Trivial mitral valve regurgitation.  5. The aortic valve is normal in structure. Aortic valve regurgitation is not visualized.  6. Agitated saline contrast bubble study was negative, with no evidence of any interatrial shunt. FINDINGS  Left Ventricle: Left ventricular ejection fraction, by estimation, is 60 to 65%. The left ventricle has normal function. The left ventricular internal cavity size was small. Right Ventricle: The right ventricular size is normal. No increase in right ventricular wall thickness. Right ventricular systolic function is normal. Left Atrium: Left atrial size was normal in size. No left atrial/left atrial appendage thrombus was detected. Right Atrium: Right atrial size was normal in size. Pericardium: There is no evidence of pericardial effusion. Mitral Valve: The mitral valve is normal in structure. Trivial mitral valve regurgitation. There is no evidence of mitral valve vegetation. Tricuspid Valve: The tricuspid valve is normal in structure. Tricuspid valve regurgitation is trivial. There is no evidence of tricuspid valve vegetation. Aortic Valve: The aortic valve is normal in structure. Aortic valve regurgitation is not visualized. There is no evidence of aortic valve vegetation. Pulmonic Valve: The pulmonic valve was normal in structure. Pulmonic valve regurgitation is trivial. There is no evidence of pulmonic valve vegetation. Aorta: The aortic root and ascending aorta are structurally normal, with no evidence of dilitation. There is minimal (Grade I) plaque. IAS/Shunts: No atrial level shunt detected by color flow Doppler. Agitated saline contrast was given intravenously to evaluate for intracardiac shunting.  Agitated saline contrast bubble study was negative, with no evidence of any interatrial shunt. There  is no evidence of a patent foramen ovale. No ventricular septal defect is seen or detected. There is no evidence of an atrial septal defect. Serafina Royals MD Electronically signed by Serafina Royals MD Signature Date/Time: 11/20/2021/8:31:34 AM    Final    ECHOCARDIOGRAM COMPLETE  Result Date: 11/18/2021    ECHOCARDIOGRAM REPORT  Patient Name:   John Booker Date of Exam: 11/18/2021 Medical Rec #:  [id number]      Height:       75.0 in Accession #:    [id number]     Weight:       220.9 lb Date of Birth:  04-15-1950      BSA:          2.289 m Patient Age:    73 years       BP:           164/79 mmHg Patient Gender: M              HR:           94 bpm. Exam Location:  [facility] Procedure: Transesophageal Echo, Color Doppler and Cardiac Doppler Indications:     Bacteremia R78.81  History:         Patient has prior history of Echocardiogram examinations, most                  recent 04/11/2021. Risk Factors:Hypertension.  Sonographer:     Sherrie Sport Referring Phys:  [id number] [facility] AMIN Diagnosing Phys: Kathlyn Sacramento MD  Sonographer Comments: Technically challenging study due to limited acoustic windows, suboptimal apical window and no subcostal window. IMPRESSIONS  1. Left ventricular ejection fraction, by estimation, is 60 to 65%. The left ventricle has normal function. The left ventricle has no regional wall motion abnormalities. There is mild left ventricular hypertrophy. Left ventricular diastolic parameters are consistent with Grade I diastolic dysfunction (impaired relaxation).  2. Right ventricular systolic function is normal. The right ventricular size is normal. Tricuspid regurgitation signal is inadequate for assessing PA pressure.  3. The mitral valve is normal in structure. No evidence of mitral valve regurgitation. No evidence of mitral stenosis.  4. The aortic valve is normal in structure. Aortic valve  regurgitation is not visualized. No aortic stenosis is present. Conclusion(s)/Recommendation(s): No evidence of valvular vegetations on this transthoracic echocardiogram. Suboptimal images. FINDINGS  Left Ventricle: Left ventricular ejection fraction, by estimation, is 60 to 65%. The left ventricle has normal function. The left ventricle has no regional wall motion abnormalities. The left ventricular internal cavity size was normal in size. There is  mild left ventricular hypertrophy. Left ventricular diastolic parameters are consistent with Grade I diastolic dysfunction (impaired relaxation). Right Ventricle: The right ventricular size is normal. No increase in right ventricular wall thickness. Right ventricular systolic function is normal. Tricuspid regurgitation signal is inadequate for assessing PA pressure. Left Atrium: Left atrial size was normal in size. Right Atrium: Right atrial size was normal in size. Pericardium: Trivial pericardial effusion is present. Mitral Valve: The mitral valve is normal in structure. No evidence of mitral valve regurgitation. No evidence of mitral valve stenosis. Tricuspid Valve: The tricuspid valve is normal in structure. Tricuspid valve regurgitation is not demonstrated. No evidence of tricuspid stenosis. Aortic Valve: The aortic valve is normal in structure. Aortic valve regurgitation is not visualized. No aortic stenosis is present. Aortic valve mean gradient measures 2.0 mmHg. Aortic valve peak gradient measures 4.0 mmHg. Aortic valve area, by VTI measures 3.07 cm. Pulmonic Valve: The pulmonic valve was normal in structure. Pulmonic valve regurgitation is not visualized. No evidence of pulmonic stenosis. Aorta: The aortic root is normal in size and structure. Venous: The inferior vena cava was not well visualized. IAS/Shunts: No atrial level shunt detected by color flow Doppler.  LEFT VENTRICLE PLAX 2D LVIDd:  3.60 cm   Diastology LVIDs:         2.30 cm   LV e'  medial:    4.90 cm/s LV PW:         1.20 cm   LV E/e' medial:  17.2 LV IVS:        1.20 cm   LV e' lateral:   9.79 cm/s LVOT diam:     2.00 cm   LV E/e' lateral: 8.6 LV SV:         55 LV SV Index:   24 LVOT Area:     3.14 cm  RIGHT VENTRICLE RV Basal diam:  3.50 cm RV S prime:     14.60 cm/s TAPSE (M-mode): 2.9 cm LEFT ATRIUM           Index        RIGHT ATRIUM           Index LA diam:      3.10 cm 1.35 cm/m   RA Area:     14.10 cm LA Vol (A4C): 39.6 ml 17.30 ml/m  RA Volume:   31.10 ml  13.59 ml/m  AORTIC VALVE AV Area (Vmax):    2.46 cm AV Area (Vmean):   2.44 cm AV Area (VTI):     3.07 cm AV Vmax:           100.00 cm/s AV Vmean:          68.200 cm/s AV VTI:            0.180 m AV Peak Grad:      4.0 mmHg AV Mean Grad:      2.0 mmHg LVOT Vmax:         78.30 cm/s LVOT Vmean:        52.900 cm/s LVOT VTI:          0.176 m LVOT/AV VTI ratio: 0.98  AORTA Ao Root diam: 3.60 cm MITRAL VALVE                TRICUSPID VALVE MV Area (PHT): 4.39 cm     TR Peak grad:   8.8 mmHg MV Decel Time: 173 msec     TR Vmax:        148.00 cm/s MV E velocity: 84.40 cm/s MV A velocity: 129.00 cm/s  SHUNTS MV E/A ratio:  0.65         Systemic VTI:  0.18 m                             Systemic Diam: 2.00 cm Kathlyn Sacramento MD Electronically signed by Kathlyn Sacramento MD Signature Date/Time: 11/18/2021/1:52:49 PM    Final    CT Cervical Spine Wo Contrast  Result Date: 11/15/2021 CLINICAL DATA:  Fall at home.  Neck trauma. EXAM: CT CERVICAL SPINE WITHOUT CONTRAST TECHNIQUE: Multidetector CT imaging of the cervical spine was performed without intravenous contrast. Multiplanar CT image reconstructions were also generated. RADIATION DOSE REDUCTION: This exam was performed according to the departmental dose-optimization program which includes automated exposure control, adjustment of the mA and/or kV according to patient size and/or use of iterative reconstruction technique. COMPARISON:  MRI of the cervical spine 12/29/2020 FINDINGS:  Alignment: No significant listhesis is present. Straightening of the normal cervical lordosis is again noted. Skull base and vertebrae: Craniocervical junction is within normal limits. Vertebral body heights are maintained. No acute fractures are present. Soft tissues and spinal canal: No  prevertebral fluid or swelling. No visible canal hematoma. 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 Moderate left foraminal stenosis at C3-4 and right foraminal stenosis at C4-5 is stable. Moderate foraminal narrowing bilaterally at C5-6 is stable. Upper chest: Lung apices are clear. The thoracic inlet is within normal limits. IMPRESSION: 1. No acute fracture or traumatic subluxation. 2. 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 3. Multilevel foraminal stenosis as described. Electronically Signed   By: San Morelle M.D.   On: 11/15/2021 18:43   DG Chest Portable 1 View  Result Date: 11/15/2021 CLINICAL DATA:  Fall in bathroom.  Weakness. EXAM: PORTABLE CHEST 1 VIEW COMPARISON:  06/27/2011 FINDINGS: The heart size and mediastinal contours are within normal limits. Both lungs are clear. No evidence of pneumothorax or hemothorax. The visualized skeletal structures are unremarkable. IMPRESSION: No active disease. Electronically Signed   By: Marlaine Hind M.D.   On: 11/15/2021 18:40   CT HEAD WO CONTRAST (5MM)  Result Date: 11/15/2021 CLINICAL DATA:  Fall at home while going to the bathroom. No loss of consciousness. Patient denies hitting head. EXAM: CT HEAD WITHOUT CONTRAST TECHNIQUE: Contiguous axial images were obtained from the base of the skull through the vertex without intravenous contrast. RADIATION DOSE REDUCTION: This exam was performed according to the departmental dose-optimization program which includes automated  exposure control, adjustment of the mA and/or kV according to patient size and/or use of iterative reconstruction technique. COMPARISON:  CT head without contrast 06/26/2021 MR head without contrast 12/29/2020 FINDINGS: Brain: No acute infarct, hemorrhage, or mass lesion is present. Mild atrophy and white matter changes are stable, likely within normal limits for age. The ventricles are of normal size. No significant extraaxial fluid collection is present. The brainstem and cerebellum are within normal limits. Vascular: No hyperdense vessel or unexpected calcification. Skull: No significant extracranial soft tissue lesion is present. Calvarium is intact. No focal lytic or blastic lesions are present. Cerumen is present in the external auditory canals bilaterally. Sinuses/Orbits: The paranasal sinuses and mastoid air cells are clear. The globes and orbits are within normal limits. IMPRESSION: 1. No acute intracranial abnormality or significant interval change. 2. Stable atrophy and white matter disease, likely within normal limits for age. Electronically Signed   By: San Morelle M.D.   On: 11/15/2021 18:40   NM PET Image Initial (PI) Skull Base To Thigh  Result Date: 11/13/2021 CLINICAL DATA:  Initial treatment strategy for mediastinal mass and possible esophageal lesion. EXAM: NUCLEAR MEDICINE PET SKULL BASE TO THIGH TECHNIQUE: 12.6 mCi F-18 FDG was injected intravenously. Full-ring PET imaging was performed from the skull base to thigh after the radiotracer. CT data was obtained and used for attenuation correction and anatomic localization. Fasting blood glucose: 104 mg/dl COMPARISON:  CT on 10/01/2021 FINDINGS: Mediastinal blood-pool activity (background): SUV max = 1.8 Liver activity (reference): SUV max = N/A NECK:  No hypermetabolic lymph nodes or masses. Incidental CT findings:  None. CHEST: No hypermetabolic lymph nodes or other masses. No suspicious pulmonary nodules seen on CT images. Mixed fat  and soft tissue attenuation masses are again seen in the left thoracic paraspinal regions, but show absence of hypermetabolic activity suggesting benign lesions such as extramedullary hematopoiesis. No evidence of hypermetabolism involving the esophagus. Incidental CT findings:  None. ABDOMEN/PELVIS: No abnormal hypermetabolic activity within the liver,  pancreas, adrenal glands, or spleen. No hypermetabolic lymph nodes in the abdomen or pelvis. Incidental CT findings: Stable mildly enlarged prostate gland. Diverticulosis is seen mainly involving the sigmoid colon, however there is no evidence of diverticulitis. Aortic atherosclerotic calcification incidentally noted. SKELETON: No focal hypermetabolic bone lesions to suggest skeletal metastasis. Incidental CT findings:  None. IMPRESSION: No evidence of malignancy. Left thoracic paraspinal mixed fat and soft tissue attenuation masses show absence of hypermetabolism, consistent with benign etiology such as extramedullary hematopoiesis. Suggest continued follow-up by chest CT in 6 months. Electronically Signed   By: Marlaine Hind M.D.   On: 11/13/2021 13:33    ASSESSMENT: Benign paraspinal mass.  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  Unclear if this is the etiology of patient's hiccups.  Have ordered laboratory work today for completeness and we will get a PET scan in the next 1 to 2 weeks for further evaluation.  If PET scan is negative, will continue to follow masses with serial CT scans.  If PET scan is positive, patient will likely require biopsy.  He will have a video assisted telemedicine visit 2 to 3 days after the PET scan to discuss the results and additional diagnostic planning.   I provided *** minutes of {Blank single:19197::"face-to-face video visit time","non face-to-face  telephone visit time"} during this encounter which included chart review, counseling, and coordination of care as documented above.  Patient expressed understanding and was in agreement with this plan. He also understands that He can call clinic at any time with any questions, concerns, or complaints.    Cancer Staging  No matching staging information was found for the patient.  Lloyd Huger, MD   11/20/2021 11:26 PM

## 2021-11-20 NOTE — CV Procedure (Signed)
Transesophageal echocardiogram preliminary report  John Booker [id number] [birth year]  Preliminary diagnosis  Bacteremia with possible endocarditis  Postprocedural diagnosis Normal LV function without evidence of endocarditis or vegetation  Time out A timeout was performed by the nursing staff and physicians specifically identifying the procedure performed, identification of the patient, the type of sedation, all allergies and medications, all pertinent medical history, and presedation assessment of nasopharynx. The patient and or family understand the risks of the procedure including the rare risks of death, stroke, heart attack, esophogeal perforation, sore throat, and reaction to medications given.  Moderate sedation During this procedure the patient has received Versed 3 milligrams and fentanyl 75 micrograms to achieve appropriate moderate sedation.  The patient had continued monitoring of heart rate, oxygenation, blood pressure, respiratory rate, and extent of signs of sedation throughout the entire procedure.  The patient received this moderate sedation over a period of 16 minutes.  Both the nursing staff and I were present during the procedure when the patient had moderate sedation for 100% of the time.  Treatment considerations  No additional treatment considerations needed for bacteremia due to no current evidence of endocarditis  For further details of transesophageal echocardiogram please refer to final report.  Signed,  Corey Skains M.D. [facility] 11/20/2021 8:27 AM

## 2021-11-20 NOTE — Plan of Care (Signed)
PIV removed, PICC placed for long term antibiotic therapy.  Report called to [facility] and patient discharged with daughter who will transport to facility.

## 2021-11-20 NOTE — Discharge Summary (Signed)
Physician Discharge Summary  John Booker XHB:[id number] DOB: [birth year] DOA: 11/15/2021  PCP: Dion Body, MD  Admit date: 11/15/2021 Discharge date: 11/20/2021  Admitted From: Home Disposition:  SNF  Discharge Condition:Stable CODE STATUS: DNR Diet recommendation: Heart Healthy   Brief/Interim Summary: John Booker is a 71 y.o. male with medical history significant for hypertension, presented to emergency room because of generalized weakness lately and a fall.  The patient was too weak to get up and remained on the ground about 8 hours before he was found..  Lab work showed hypokalemia, creatinine of 2.2, CK was 3521.  Patient was started on IV fluids.  Procalcitonin was found to be elevated.  Blood cultures showed MSSA.  ID consulted.  Started on cefazolin.  Due to echo and TEE did not show any vegetation.  PT/OT recommending SNF nursing facility on discharge.  MRI of lumbar spine did not show any signs of osteomyelitis but small collection on L5-S1 .  ID recommended 6 weeks of IV antibiotics with cefazolin.  Medically stable for discharge to SNF after PICC line.   Following problems were addressed during his hospitalization:  Acute renal failure due to rhabdomyolysis ([place]) Improved with iv fluids  Bacteremia due to methicillin susceptible Staphylococcus aureus (MSSA) Procalcitonin was elevated.  Blood cultures showed MSSA Initially received ceftriaxone and later switched to cefazolin. Repeat blood cultures negative in 24 hours. TTE was negative for any vegetations.  TEE was also negative. ID was following MRI lumbar spine ordered due to his complaint of lower back pain to rule out osteo.It showed  small soft tissue collection/edema around L5-S1. ID recommended 6 weeks of IV antibiotics.  PICC line will be placed. Regimen: cefazolin 2gm IV q8h End date: 12/28/2021   Labs - Once weekly:  CBC/D, CMP, ESR and CRP  Please pull PIC at completion of IV antibiotics  Fax weekly  lab results  promptly to [phone]   He will follow-up with ID on 12/12/2021 at 10:45 AM     Hypokalemia Continue supplementation.  Check BMP in a week   hyponatremia Stable ,monitor    Essential hypertension On losartan, Lasix   GERD without esophagitis - Continue PPI therapy.   Intractable hiccups Patient takes baclofen at home which was resumed.  Apparently a chronic issue which has been investigated, CT chest with concern of mediastinal masses which were followed up by PET scan and they were negative for malignancy. -Treated with Thorazine    Discharge Diagnoses:  Principal Problem:   Acute renal failure due to rhabdomyolysis [facility]) Active Problems:   Bacteremia due to methicillin susceptible Staphylococcus aureus (MSSA)   Hypokalemia   Hyponatremia   Essential hypertension   GERD without esophagitis   Intractable hiccups   Acute renal insufficiency   Non-traumatic rhabdomyolysis    Discharge Instructions  Discharge Instructions     Diet - low sodium heart healthy   Complete by: As directed    Discharge instructions   Complete by: As directed    1)Please take prescribed medications as instructed 2)Do a CBC and BMP test in a week 3)Follow up with infectious disease on 12/12/2021 at 10:45 AM   Home infusion instructions   Complete by: As directed    Instructions: Flushing of vascular access device: 0.9% NaCl pre/post medication administration and prn patency; Heparin 100 u/ml, 59m for implanted ports and Heparin 10u/ml, 597mfor all other central venous catheters.   Increase activity slowly   Complete by: As directed  Allergies as of 11/20/2021       Reactions   Adalat [nifedipine]    Foot swelling.    Amlodipine Other (See Comments)   Leg swelling        Medication List     STOP taking these medications    saccharomyces boulardii 250 MG capsule Commonly known as: FLORASTOR   Vitamin D (Ergocalciferol) 1.25 MG (50000 UNIT) Caps  capsule Commonly known as: DRISDOL       TAKE these medications    acetaminophen 325 MG tablet Commonly known as: TYLENOL Take 2 tablets (650 mg total) by mouth every 6 (six) hours as needed for mild pain or fever.   aspirin EC 81 MG tablet Take 1 tablet (81 mg total) by mouth daily. Swallow whole.   baclofen 10 MG tablet Commonly known as: LIORESAL Take 1 tablet (10 mg total) by mouth 2 (two) times daily.   ceFAZolin  IVPB Commonly known as: ANCEF Inject 2 g into the vein every 8 (eight) hours. Indication:  MSSA bacteremia and possible paraspinal abscess Last Day of Therapy:  12/28/2021 Labs - Once weekly:  CBC/D, CMP, ESR and CRP Please pull PIC at completion of IV antibiotics Fax weekly lab results  promptly to [phone] Method of administration: IV Push Method of administration may be changed at the discretion of home infusion pharmacist based upon assessment of the patient and/or caregiver's ability to self-administer the medication ordered.   cyanocobalamin 1000 MCG tablet Take 1 tablet (1,000 mcg total) by mouth daily.   feeding supplement Liqd Take 237 mLs by mouth 3 (three) times daily between meals.   furosemide 40 MG tablet Commonly known as: LASIX Take 40 mg by mouth daily.   losartan 100 MG tablet Commonly known as: COZAAR Take 1 tablet (100 mg total) by mouth daily.   multivitamin with minerals Tabs tablet Take 1 tablet by mouth daily.   omeprazole 20 MG capsule Commonly known as: PRILOSEC Take 20 mg by mouth in the morning and at bedtime.   potassium chloride SA 20 MEQ tablet Commonly known as: KLOR-CON M Take 2 tablets (40 mEq total) by mouth daily for 3 days. Start taking on: November 21, 2021   sildenafil 20 MG tablet Commonly known as: REVATIO Take 1-5 pills about 30 min prior to sex. Start with 1 and increase as needed.               Home Infusion Instuctions  (From admission, onward)           Start     Ordered    11/20/21 0000  Home infusion instructions       Question:  Instructions  Answer:  Flushing of vascular access device: 0.9% NaCl pre/post medication administration and prn patency; Heparin 100 u/ml, 69m for implanted ports and Heparin 10u/ml, 548mfor all other central venous catheters.   11/20/21 1452            Contact information for after-discharge care     Destination     [facility] Preferred SNF .   Service: Skilled Nursing Contact information: [phone]                  Allergies  Allergen Reactions   Adalat [Nifedipine]     Foot swelling.    Amlodipine Other (See Comments)    Leg swelling    Consultations: ID   Procedures/Studies: [place] SITE RITE  Result Date: 11/20/2021 If Site Rite image not attached, placement could not be confirmed due to current cardiac rhythm.  MR LUMBAR SPINE WO CONTRAST  Result Date: 11/20/2021 CLINICAL DATA:  Low back pain infection suspected. EXAM: MRI LUMBAR SPINE WITHOUT CONTRAST TECHNIQUE: Multiplanar, multisequence MR imaging of the lumbar spine was performed. No intravenous contrast was administered. COMPARISON:  PET-CT 11/12/2021 FINDINGS: Segmentation: The last well-formed disc space is designated as L5-S1 with a small rudimentary disc space at S1-S2. Congenitally small AP dimension of the spinal canal. Alignment: Physiologic. Vertebrae: No evidence of vertebral body height loss or endplate erosion. There are scattered degenerative reactive marrow changes. Conus medullaris and cauda equina: Conus extends to the L1 level. Conus and cauda equina appear normal. Paraspinal and other soft tissues: There is soft tissue edema within the paraspinal musculature the S1 vertebral body level with 2 small fluid collections adjacent to the L5-S1 facet joints, measuring up to 0 9 x 2.0 cm on the left (series 7, image 13) and 1.4 x 1.8 cm on the right (series 7, image  5-6). There is also surrounding soft tissue edema adjacent to these fluid collections. These collections are not visualized on PET-CT dated 11/12/2021 Disc levels: T12-L1: No significant spinal stenosis. L1-L2: Mild bilateral facet degenerative change. No spinal canal stenosis. No foraminal stenosis. L2-L3: Mild bilateral facet degenerative change. No spinal canal or foraminal stenosis. L3-L4: Mild bilateral facet degenerative change. No spinal canal or foraminal stenosis. L4-L5: Mild epidural lipomatosis. Moderate to severe bilateral facet degenerative change. Ligamentum flavum hypertrophy. Mild bony spinal canal stenosis. Moderate narrowing of the thecal sac. L5-S1: Small disc bulge with an annular fissure. Ligamentum flavum hypertrophy. Epidural lipomatosis. Mild bony spinal canal narrowing. Moderate narrowing of the thecal sac. IMPRESSION: 1. There are two small soft tissue fluid collections arising from the bilateral L5-S1 facet joints with surrounding soft tissue edema. The collection on the right measures 1.4 x 1.8 cm and the collection on the left measures 0.9 x 2.0 cm. There is no evidence of marrow edema or osseous destruction at the adjacent facet joints. Findings could be degenerative or reactive in nature, but given patient's history of bacteremia and elevated inflammatory markers, early septic arthritis is difficult to exclude. If clinically indicated, this could be further assessed with a contrast enhanced lumbar spine MRI. 2. Lower lumbar spine predominant degenerative disc and facet disease without evidence of high grade bony spinal canal stenosis. Electronically Signed   By: Marin Roberts M.D.   On: 11/20/2021 09:15   ECHO TEE  Result Date: 11/20/2021    TRANSESOPHOGEAL ECHO REPORT   Patient Name:   John Booker Date of Exam: 11/20/2021 Medical Rec #:  [id number]      Height:       75.0 in Accession #:    [id number]     Weight:       220.9 lb Date of Birth:  1950/06/17      BSA:          2.289  m Patient Age:    59 years       BP:           179/83 mmHg Patient Gender: M              HR:           85 bpm. Exam Location:  [facility] Procedure: Transesophageal Echo, Cardiac Doppler, Color Doppler and Saline            Contrast Bubble Study  Indications:     Bacteremia R78.81  History:         Patient has prior history of Echocardiogram examinations, most                  recent 11/18/2021. Risk Factors:Hypertension.  Sonographer:     Sherrie Sport Referring Phys:  Alanson Puls TANG Diagnosing Phys: Serafina Royals MD PROCEDURE: The transesophogeal probe was passed without difficulty through the esophogus of the patient. Sedation performed by performing physician. The patient developed no complications during the procedure. IMPRESSIONS  1. Left ventricular ejection fraction, by estimation, is 60 to 65%. The left ventricle has normal function.  2. Right ventricular systolic function is normal. The right ventricular size is normal.  3. No left atrial/left atrial appendage thrombus was detected.  4. The mitral valve is normal in structure. Trivial mitral valve regurgitation.  5. The aortic valve is normal in structure. Aortic valve regurgitation is not visualized.  6. Agitated saline contrast bubble study was negative, with no evidence of any interatrial shunt. FINDINGS  Left Ventricle: Left ventricular ejection fraction, by estimation, is 60 to 65%. The left ventricle has normal function. The left ventricular internal cavity size was small. Right Ventricle: The right ventricular size is normal. No increase in right ventricular wall thickness. Right ventricular systolic function is normal. Left Atrium: Left atrial size was normal in size. No left atrial/left atrial appendage thrombus was detected. Right Atrium: Right atrial size was normal in size. Pericardium: There is no evidence of pericardial effusion. Mitral Valve: The mitral valve is normal in structure. Trivial mitral valve regurgitation. There is no evidence of  mitral valve vegetation. Tricuspid Valve: The tricuspid valve is normal in structure. Tricuspid valve regurgitation is trivial. There is no evidence of tricuspid valve vegetation. Aortic Valve: The aortic valve is normal in structure. Aortic valve regurgitation is not visualized. There is no evidence of aortic valve vegetation. Pulmonic Valve: The pulmonic valve was normal in structure. Pulmonic valve regurgitation is trivial. There is no evidence of pulmonic valve vegetation. Aorta: The aortic root and ascending aorta are structurally normal, with no evidence of dilitation. There is minimal (Grade I) plaque. IAS/Shunts: No atrial level shunt detected by color flow Doppler. Agitated saline contrast was given intravenously to evaluate for intracardiac shunting. Agitated saline contrast bubble study was negative, with no evidence of any interatrial shunt. There  is no evidence of a patent foramen ovale. No ventricular septal defect is seen or detected. There is no evidence of an atrial septal defect. Serafina Royals MD Electronically signed by Serafina Royals MD Signature Date/Time: 11/20/2021/8:31:34 AM    Final    ECHOCARDIOGRAM COMPLETE  Result Date: 11/18/2021    ECHOCARDIOGRAM REPORT   Patient Name:   John Booker Date of Exam: 11/18/2021 Medical Rec #:  [id number]      Height:       75.0 in Accession #:    [id number]     Weight:       220.9 lb Date of Birth:  05/20/1950      BSA:          2.289 m Patient Age:    68 years       BP:           164/79 mmHg Patient Gender: M              HR:           94 bpm. Exam Location:  [facility] Procedure: Transesophageal Echo,  Color Doppler and Cardiac Doppler Indications:     Bacteremia R78.81  History:         Patient has prior history of Echocardiogram examinations, most                  recent 04/11/2021. Risk Factors:Hypertension.  Sonographer:     Sherrie Sport Referring Phys:  [id number] [facility] AMIN Diagnosing Phys: Kathlyn Sacramento MD  Sonographer Comments: Technically challenging  study due to limited acoustic windows, suboptimal apical window and no subcostal window. IMPRESSIONS  1. Left ventricular ejection fraction, by estimation, is 60 to 65%. The left ventricle has normal function. The left ventricle has no regional wall motion abnormalities. There is mild left ventricular hypertrophy. Left ventricular diastolic parameters are consistent with Grade I diastolic dysfunction (impaired relaxation).  2. Right ventricular systolic function is normal. The right ventricular size is normal. Tricuspid regurgitation signal is inadequate for assessing PA pressure.  3. The mitral valve is normal in structure. No evidence of mitral valve regurgitation. No evidence of mitral stenosis.  4. The aortic valve is normal in structure. Aortic valve regurgitation is not visualized. No aortic stenosis is present. Conclusion(s)/Recommendation(s): No evidence of valvular vegetations on this transthoracic echocardiogram. Suboptimal images. FINDINGS  Left Ventricle: Left ventricular ejection fraction, by estimation, is 60 to 65%. The left ventricle has normal function. The left ventricle has no regional wall motion abnormalities. The left ventricular internal cavity size was normal in size. There is  mild left ventricular hypertrophy. Left ventricular diastolic parameters are consistent with Grade I diastolic dysfunction (impaired relaxation). Right Ventricle: The right ventricular size is normal. No increase in right ventricular wall thickness. Right ventricular systolic function is normal. Tricuspid regurgitation signal is inadequate for assessing PA pressure. Left Atrium: Left atrial size was normal in size. Right Atrium: Right atrial size was normal in size. Pericardium: Trivial pericardial effusion is present. Mitral Valve: The mitral valve is normal in structure. No evidence of mitral valve regurgitation. No evidence of mitral valve stenosis. Tricuspid Valve: The tricuspid valve is normal in structure.  Tricuspid valve regurgitation is not demonstrated. No evidence of tricuspid stenosis. Aortic Valve: The aortic valve is normal in structure. Aortic valve regurgitation is not visualized. No aortic stenosis is present. Aortic valve mean gradient measures 2.0 mmHg. Aortic valve peak gradient measures 4.0 mmHg. Aortic valve area, by VTI measures 3.07 cm. Pulmonic Valve: The pulmonic valve was normal in structure. Pulmonic valve regurgitation is not visualized. No evidence of pulmonic stenosis. Aorta: The aortic root is normal in size and structure. Venous: The inferior vena cava was not well visualized. IAS/Shunts: No atrial level shunt detected by color flow Doppler.  LEFT VENTRICLE PLAX 2D LVIDd:         3.60 cm   Diastology LVIDs:         2.30 cm   LV e' medial:    4.90 cm/s LV PW:         1.20 cm   LV E/e' medial:  17.2 LV IVS:        1.20 cm   LV e' lateral:   9.79 cm/s LVOT diam:     2.00 cm   LV E/e' lateral: 8.6 LV SV:         55 LV SV Index:   24 LVOT Area:     3.14 cm  RIGHT VENTRICLE RV Basal diam:  3.50 cm RV S prime:     14.60 cm/s TAPSE (M-mode): 2.9 cm LEFT ATRIUM  Index        RIGHT ATRIUM           Index LA diam:      3.10 cm 1.35 cm/m   RA Area:     14.10 cm LA Vol (A4C): 39.6 ml 17.30 ml/m  RA Volume:   31.10 ml  13.59 ml/m  AORTIC VALVE AV Area (Vmax):    2.46 cm AV Area (Vmean):   2.44 cm AV Area (VTI):     3.07 cm AV Vmax:           100.00 cm/s AV Vmean:          68.200 cm/s AV VTI:            0.180 m AV Peak Grad:      4.0 mmHg AV Mean Grad:      2.0 mmHg LVOT Vmax:         78.30 cm/s LVOT Vmean:        52.900 cm/s LVOT VTI:          0.176 m LVOT/AV VTI ratio: 0.98  AORTA Ao Root diam: 3.60 cm MITRAL VALVE                TRICUSPID VALVE MV Area (PHT): 4.39 cm     TR Peak grad:   8.8 mmHg MV Decel Time: 173 msec     TR Vmax:        148.00 cm/s MV E velocity: 84.40 cm/s MV A velocity: 129.00 cm/s  SHUNTS MV E/A ratio:  0.65         Systemic VTI:  0.18 m                              Systemic Diam: 2.00 cm Kathlyn Sacramento MD Electronically signed by Kathlyn Sacramento MD Signature Date/Time: 11/18/2021/1:52:49 PM    Final    CT Cervical Spine Wo Contrast  Result Date: 11/15/2021 CLINICAL DATA:  Fall at home.  Neck trauma. EXAM: CT CERVICAL SPINE WITHOUT CONTRAST TECHNIQUE: Multidetector CT imaging of the cervical spine was performed without intravenous contrast. Multiplanar CT image reconstructions were also generated. RADIATION DOSE REDUCTION: This exam was performed according to the departmental dose-optimization program which includes automated exposure control, adjustment of the mA and/or kV according to patient size and/or use of iterative reconstruction technique. COMPARISON:  MRI of the cervical spine 12/29/2020 FINDINGS: Alignment: No significant listhesis is present. Straightening of the normal cervical lordosis is again noted. Skull base and vertebrae: Craniocervical junction is within normal limits. Vertebral body heights are maintained. No acute fractures are present. Soft tissues and spinal canal: No prevertebral fluid or swelling. No visible canal hematoma. 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 Moderate left foraminal stenosis at C3-4 and right foraminal stenosis at C4-5 is stable. Moderate foraminal narrowing bilaterally at C5-6 is stable. Upper chest: Lung apices are clear. The thoracic inlet is within normal limits. IMPRESSION: 1. No acute fracture or traumatic subluxation. 2. 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 3. Multilevel foraminal stenosis as described. Electronically Signed   By: San Morelle M.D.   On: 11/15/2021 18:43   DG Chest Portable 1 View  Result Date: 11/15/2021 CLINICAL DATA:  Fall in bathroom.  Weakness. EXAM: PORTABLE CHEST 1  VIEW COMPARISON:   06/27/2011 FINDINGS: The heart size and mediastinal contours are within normal limits. Both lungs are clear. No evidence of pneumothorax or hemothorax. The visualized skeletal structures are unremarkable. IMPRESSION: No active disease. Electronically Signed   By: Marlaine Hind M.D.   On: 11/15/2021 18:40   CT HEAD WO CONTRAST (5MM)  Result Date: 11/15/2021 CLINICAL DATA:  Fall at home while going to the bathroom. No loss of consciousness. Patient denies hitting head. EXAM: CT HEAD WITHOUT CONTRAST TECHNIQUE: Contiguous axial images were obtained from the base of the skull through the vertex without intravenous contrast. RADIATION DOSE REDUCTION: This exam was performed according to the departmental dose-optimization program which includes automated exposure control, adjustment of the mA and/or kV according to patient size and/or use of iterative reconstruction technique. COMPARISON:  CT head without contrast 06/26/2021 MR head without contrast 12/29/2020 FINDINGS: Brain: No acute infarct, hemorrhage, or mass lesion is present. Mild atrophy and white matter changes are stable, likely within normal limits for age. The ventricles are of normal size. No significant extraaxial fluid collection is present. The brainstem and cerebellum are within normal limits. Vascular: No hyperdense vessel or unexpected calcification. Skull: No significant extracranial soft tissue lesion is present. Calvarium is intact. No focal lytic or blastic lesions are present. Cerumen is present in the external auditory canals bilaterally. Sinuses/Orbits: The paranasal sinuses and mastoid air cells are clear. The globes and orbits are within normal limits. IMPRESSION: 1. No acute intracranial abnormality or significant interval change. 2. Stable atrophy and white matter disease, likely within normal limits for age. Electronically Signed   By: San Morelle M.D.   On: 11/15/2021 18:40   NM PET Image Initial (PI) Skull Base To  Thigh  Result Date: 11/13/2021 CLINICAL DATA:  Initial treatment strategy for mediastinal mass and possible esophageal lesion. EXAM: NUCLEAR MEDICINE PET SKULL BASE TO THIGH TECHNIQUE: 12.6 mCi F-18 FDG was injected intravenously. Full-ring PET imaging was performed from the skull base to thigh after the radiotracer. CT data was obtained and used for attenuation correction and anatomic localization. Fasting blood glucose: 104 mg/dl COMPARISON:  CT on 10/01/2021 FINDINGS: Mediastinal blood-pool activity (background): SUV max = 1.8 Liver activity (reference): SUV max = N/A NECK:  No hypermetabolic lymph nodes or masses. Incidental CT findings:  None. CHEST: No hypermetabolic lymph nodes or other masses. No suspicious pulmonary nodules seen on CT images. Mixed fat and soft tissue attenuation masses are again seen in the left thoracic paraspinal regions, but show absence of hypermetabolic activity suggesting benign lesions such as extramedullary hematopoiesis. No evidence of hypermetabolism involving the esophagus. Incidental CT findings:  None. ABDOMEN/PELVIS: No abnormal hypermetabolic activity within the liver, pancreas, adrenal glands, or spleen. No hypermetabolic lymph nodes in the abdomen or pelvis. Incidental CT findings: Stable mildly enlarged prostate gland. Diverticulosis is seen mainly involving the sigmoid colon, however there is no evidence of diverticulitis. Aortic atherosclerotic calcification incidentally noted. SKELETON: No focal hypermetabolic bone lesions to suggest skeletal metastasis. Incidental CT findings:  None. IMPRESSION: No evidence of malignancy. Left thoracic paraspinal mixed fat and soft tissue attenuation masses show absence of hypermetabolism, consistent with benign etiology such as extramedullary hematopoiesis. Suggest continued follow-up by chest CT in 6 months. Electronically Signed   By: Marlaine Hind M.D.   On: 11/13/2021 13:33      Subjective: Patient seen and examined at  bedside today.  Hemodynamically stable.  Medically stable for discharge after PICC line placement.  Discharge Exam: Vitals:  11/20/21 0830 11/20/21 0845  BP: (!) 156/77 (!) 147/74  Pulse: 95 85  Resp: (!) 27 17  Temp:    SpO2: 95% 94%   Vitals:   11/20/21 0813 11/20/21 0824 11/20/21 0830 11/20/21 0845  BP: (!) 164/90 (!) 165/84 (!) 156/77 (!) 147/74  Pulse:  92 95 85  Resp:  (!) 27 (!) 27 17  Temp:      TempSrc:      SpO2:  95% 95% 94%  Weight:      Height:        General: Pt is alert, awake, not in acute distress Cardiovascular: RRR, S1/S2 +, no rubs, no gallops Respiratory: CTA bilaterally, no wheezing, no rhonchi Abdominal: Soft, NT, ND, bowel sounds + Extremities: no edema, no cyanosis    The results of significant diagnostics from this hospitalization (including imaging, microbiology, ancillary and laboratory) are listed below for reference.     Microbiology: Recent Results (from the past 240 hour(s))  SARS Coronavirus 2 by RT PCR (hospital order, performed in [facility] hospital lab) *cepheid single result test* Anterior Nasal Swab     Status: None   Collection Time: 11/15/21  7:11 PM   Specimen: Anterior Nasal Swab  Result Value Ref Range Status   SARS Coronavirus 2 by RT PCR NEGATIVE NEGATIVE Final    Comment: (NOTE) SARS-CoV-2 target nucleic acids are NOT DETECTED.  The SARS-CoV-2 RNA is generally detectable in upper and lower respiratory specimens during the acute phase of infection. The lowest concentration of SARS-CoV-2 viral copies this assay can detect is 250 copies / mL. A negative result does not preclude SARS-CoV-2 infection and should not be used as the sole basis for treatment or other patient management decisions.  A negative result may occur with improper specimen collection / handling, submission of specimen other than nasopharyngeal swab, presence of viral mutation(s) within the areas targeted by this assay, and inadequate number of viral  copies (<250 copies / mL). A negative result must be combined with clinical observations, patient history, and epidemiological information.  Fact Sheet for Patients:   https://www.patel.info/  Fact Sheet for Healthcare Providers: https://hall.com/  This test is not yet approved or  cleared by the [place] FDA and has been authorized for detection and/or diagnosis of SARS-CoV-2 by FDA under an Emergency Use Authorization (EUA).  This EUA will remain in effect (meaning this test can be used) for the duration of the COVID-19 declaration under Section 564(b)(1) of the Act, 21 U.S.C. section 360bbb-3(b)(1), unless the authorization is terminated or revoked sooner.  Performed at [facility], [address], [place], [place] [address]   Urine Culture     Status: Abnormal   Collection Time: 11/16/21  3:46 PM   Specimen: Urine, Clean Catch  Result Value Ref Range Status   Specimen Description   Final    URINE, CLEAN CATCH Performed at [facility], [address], [place], [place] [address]    Special Requests   Final    NONE Performed at [facility], [address] [place], [place] [address]    Culture >=100,000 COLONIES/mL STAPHYLOCOCCUS EPIDERMIDIS (A)  Final   Report Status 11/19/2021 FINAL  Final   Organism ID, Bacteria STAPHYLOCOCCUS EPIDERMIDIS (A)  Final      Susceptibility   Staphylococcus epidermidis - MIC*    CIPROFLOXACIN <=0.5 SENSITIVE Sensitive     GENTAMICIN <=0.5 SENSITIVE Sensitive     NITROFURANTOIN <=16 SENSITIVE Sensitive     OXACILLIN >=4  RESISTANT Resistant     TETRACYCLINE <=1 SENSITIVE Sensitive     VANCOMYCIN 1 SENSITIVE Sensitive     TRIMETH/SULFA <=10 SENSITIVE Sensitive     CLINDAMYCIN <=0.25 SENSITIVE Sensitive     RIFAMPIN <=0.5 SENSITIVE Sensitive     Inducible Clindamycin NEGATIVE Sensitive     * >=100,000 COLONIES/mL STAPHYLOCOCCUS EPIDERMIDIS  Culture, blood  (Routine X 2) w Reflex to ID Panel     Status: Abnormal   Collection Time: 11/16/21  6:28 PM   Specimen: BLOOD  Result Value Ref Range Status   Specimen Description   Final    BLOOD LEFT AC Performed at [facility], [address] [place], [place] [address]    Special Requests   Final    BOTTLES DRAWN AEROBIC AND ANAEROBIC Blood Culture results may not be optimal due to an excessive volume of blood received in culture bottles Performed at [facility], [address] [place], [place] [address]    Culture  Setup Time   Final    GRAM POSITIVE COCCI IN BOTH AEROBIC AND ANAEROBIC BOTTLES CRITICAL RESULT CALLED TO, READ BACK BY AND VERIFIED WITH: CARISSA [facility] 11/17/21 1131 AMK    Culture STAPHYLOCOCCUS AUREUS (A)  Final   Report Status 11/19/2021 FINAL  Final   Organism ID, Bacteria STAPHYLOCOCCUS AUREUS  Final      Susceptibility   Staphylococcus aureus - MIC*    CIPROFLOXACIN <=0.5 SENSITIVE Sensitive     ERYTHROMYCIN <=0.25 SENSITIVE Sensitive     GENTAMICIN <=0.5 SENSITIVE Sensitive     OXACILLIN <=0.25 SENSITIVE Sensitive     TETRACYCLINE <=1 SENSITIVE Sensitive     VANCOMYCIN 1 SENSITIVE Sensitive     TRIMETH/SULFA <=10 SENSITIVE Sensitive     CLINDAMYCIN <=0.25 SENSITIVE Sensitive     RIFAMPIN <=0.5 SENSITIVE Sensitive     Inducible Clindamycin NEGATIVE Sensitive     * STAPHYLOCOCCUS AUREUS  Culture, blood (Routine X 2) w Reflex to ID Panel     Status: None (Preliminary result)   Collection Time: 11/16/21  6:28 PM   Specimen: BLOOD RIGHT HAND  Result Value Ref Range Status   Specimen Description BLOOD RIGHT HAND  Final   Special Requests   Final    BOTTLES DRAWN AEROBIC AND ANAEROBIC Blood Culture adequate volume   Culture   Final    NO GROWTH 4 DAYS Performed at [facility], [address] [place], [place] [address]    Report Status PENDING  Incomplete  Blood Culture ID Panel (Reflexed)     Status: Abnormal   Collection Time:  11/16/21  6:28 PM  Result Value Ref Range Status   Enterococcus faecalis NOT DETECTED NOT DETECTED Final   Enterococcus Faecium NOT DETECTED NOT DETECTED Final   Listeria monocytogenes NOT DETECTED NOT DETECTED Final   Staphylococcus species DETECTED (A) NOT DETECTED Final    Comment: CRITICAL RESULT CALLED TO, READ BACK BY AND VERIFIED WITH: CARISSA DOLAN 11/17/21 1131 AMK    Staphylococcus aureus (BCID) DETECTED (A) NOT DETECTED Final    Comment: CRITICAL RESULT CALLED TO, READ BACK BY AND VERIFIED WITH: CARISSA DOLAN 11/17/21 1131 AMK    Staphylococcus epidermidis NOT DETECTED NOT DETECTED Final   Staphylococcus lugdunensis NOT DETECTED NOT DETECTED Final   Streptococcus species NOT DETECTED NOT DETECTED Final   Streptococcus agalactiae NOT DETECTED NOT DETECTED Final   Streptococcus pneumoniae NOT DETECTED NOT DETECTED Final   Streptococcus pyogenes NOT DETECTED NOT DETECTED Final   A.calcoaceticus-baumannii NOT DETECTED NOT  DETECTED Final   Bacteroides fragilis NOT DETECTED NOT DETECTED Final   Enterobacterales NOT DETECTED NOT DETECTED Final   Enterobacter cloacae complex NOT DETECTED NOT DETECTED Final   Escherichia coli NOT DETECTED NOT DETECTED Final   Klebsiella aerogenes NOT DETECTED NOT DETECTED Final   Klebsiella oxytoca NOT DETECTED NOT DETECTED Final   Klebsiella pneumoniae NOT DETECTED NOT DETECTED Final   Proteus species NOT DETECTED NOT DETECTED Final   Salmonella species NOT DETECTED NOT DETECTED Final   Serratia marcescens NOT DETECTED NOT DETECTED Final   Haemophilus influenzae NOT DETECTED NOT DETECTED Final   Neisseria meningitidis NOT DETECTED NOT DETECTED Final   Pseudomonas aeruginosa NOT DETECTED NOT DETECTED Final   Stenotrophomonas maltophilia NOT DETECTED NOT DETECTED Final   Candida albicans NOT DETECTED NOT DETECTED Final   Candida auris NOT DETECTED NOT DETECTED Final   Candida glabrata NOT DETECTED NOT DETECTED Final   Candida krusei NOT DETECTED  NOT DETECTED Final   Candida parapsilosis NOT DETECTED NOT DETECTED Final   Candida tropicalis NOT DETECTED NOT DETECTED Final   Cryptococcus neoformans/gattii NOT DETECTED NOT DETECTED Final   Meth resistant mecA/C and MREJ NOT DETECTED NOT DETECTED Final    Comment: Performed at [facility], [address] [place], [place] [address]  Culture, blood (Routine X 2) w Reflex to ID Panel     Status: None (Preliminary result)   Collection Time: 11/17/21 12:28 PM   Specimen: BLOOD LEFT HAND  Result Value Ref Range Status   Specimen Description BLOOD LEFT HAND  Final   Special Requests   Final    BOTTLES DRAWN AEROBIC AND ANAEROBIC Blood Culture adequate volume   Culture   Final    NO GROWTH 3 DAYS Performed at [facility], [address] [place], [place] [address]    Report Status PENDING  Incomplete  Culture, blood (Routine X 2) w Reflex to ID Panel     Status: None (Preliminary result)   Collection Time: 11/17/21  1:17 PM   Specimen: BLOOD RIGHT FOREARM  Result Value Ref Range Status   Specimen Description BLOOD RIGHT FOREARM  Final   Special Requests   Final    BOTTLES DRAWN AEROBIC AND ANAEROBIC Blood Culture results may not be optimal due to an inadequate volume of blood received in culture bottles   Culture   Final    NO GROWTH 3 DAYS Performed at [facility], [address] [place], [place] [address]    Report Status PENDING  Incomplete     Labs: BNP (last 3 results) Recent Labs    12/26/20 0819 06/26/21 1345  BNP 218.2* 62.9    Basic Metabolic Panel: Recent Labs  Lab 11/15/21 1911 11/16/21 0535 11/17/21 0500 11/19/21 0508 11/20/21 0522  NA 129* 131* 131* 134* 135  K 2.9* 3.1* 3.5 3.1* 3.1*  CL 86* 91* 96* 97* 97*  CO2 26 29 28 29 31   GLUCOSE 146* 124* 96 119* 95  BUN 29* 31* 23 14 12   CREATININE 2.23* 1.81* 1.13 0.85 0.82  CALCIUM 8.6* 7.8* 7.4* 8.3* 8.5*  MG  --  1.6*  --  2.1 1.9    Liver Function Tests: Recent Labs   Lab 11/15/21 1911  AST 98*  ALT 27  ALKPHOS 65  BILITOT 2.3*  PROT 6.7  ALBUMIN 3.6    No results for input(s): "LIPASE", "AMYLASE" in the last 168 hours. No results for input(s): "AMMONIA" in the last 168 hours. CBC: Recent Labs  Lab 11/15/21 1911 11/16/21 0535 11/17/21 0500  WBC 13.2* 8.6 8.6  NEUTROABS 12.2*  --  7.2  HGB 17.0 15.9 14.4  HCT 49.6 47.0 42.3  MCV 94.3 95.1 95.5  PLT 186 150 147*    Cardiac Enzymes: Recent Labs  Lab 11/16/21 0535 11/17/21 0500 11/18/21 0453 11/19/21 0508 11/20/21 0522  CKTOTAL 6,455* 3,511* 1,593* 772* 348    BNP: Invalid input(s): "POCBNP" CBG: No results for input(s): "GLUCAP" in the last 168 hours. D-Dimer No results for input(s): "DDIMER" in the last 72 hours. Hgb A1c No results for input(s): "HGBA1C" in the last 72 hours. Lipid Profile No results for input(s): "CHOL", "HDL", "LDLCALC", "TRIG", "CHOLHDL", "LDLDIRECT" in the last 72 hours. Thyroid function studies No results for input(s): "TSH", "T4TOTAL", "T3FREE", "THYROIDAB" in the last 72 hours.  Invalid input(s): "FREET3" Anemia work up No results for input(s): "VITAMINB12", "FOLATE", "FERRITIN", "TIBC", "IRON", "RETICCTPCT" in the last 72 hours. Urinalysis    Component Value Date/Time   COLORURINE AMBER (A) 11/15/2021 1911   APPEARANCEUR CLOUDY (A) 11/15/2021 1911   APPEARANCEUR Clear 10/29/2021 1034   LABSPEC 1.019 11/15/2021 1911   PHURINE 5.0 11/15/2021 1911   GLUCOSEU 50 (A) 11/15/2021 1911   HGBUR LARGE (A) 11/15/2021 1911   BILIRUBINUR NEGATIVE 11/15/2021 1911   BILIRUBINUR Negative 10/29/2021 1034   KETONESUR 5 (A) 11/15/2021 1911   PROTEINUR 100 (A) 11/15/2021 1911   NITRITE NEGATIVE 11/15/2021 1911   LEUKOCYTESUR TRACE (A) 11/15/2021 1911   Sepsis Labs Recent Labs  Lab 11/15/21 1911 11/16/21 0535 11/17/21 0500  WBC 13.2* 8.6 8.6    Microbiology Recent Results (from the past 240 hour(s))  SARS Coronavirus 2 by RT PCR (hospital order,  performed in [place] hospital lab) *cepheid single result test* Anterior Nasal Swab     Status: None   Collection Time: 11/15/21  7:11 PM   Specimen: Anterior Nasal Swab  Result Value Ref Range Status   SARS Coronavirus 2 by RT PCR NEGATIVE NEGATIVE Final    Comment: (NOTE) SARS-CoV-2 target nucleic acids are NOT DETECTED.  The SARS-CoV-2 RNA is generally detectable in upper and lower respiratory specimens during the acute phase of infection. The lowest concentration of SARS-CoV-2 viral copies this assay can detect is 250 copies / mL. A negative result does not preclude SARS-CoV-2 infection and should not be used as the sole basis for treatment or other patient management decisions.  A negative result may occur with improper specimen collection / handling, submission of specimen other than nasopharyngeal swab, presence of viral mutation(s) within the areas targeted by this assay, and inadequate number of viral copies (<250 copies / mL). A negative result must be combined with clinical observations, patient history, and epidemiological information.  Fact Sheet for Patients:   https://www.patel.info/  Fact Sheet for Healthcare Providers: https://hall.com/  This test is not yet approved or  cleared by the [place] FDA and has been authorized for detection and/or diagnosis of SARS-CoV-2 by FDA under an Emergency Use Authorization (EUA).  This EUA will remain in effect (meaning this test can be used) for the duration of the COVID-19 declaration under Section 564(b)(1) of the Act, 21 U.S.C. section 360bbb-3(b)(1), unless the authorization is terminated or revoked sooner.  Performed at [facility], [address], [place], [place] [address]   Urine Culture     Status: Abnormal   Collection Time: 11/16/21  3:46 PM   Specimen: Urine, Clean Catch  Result Value Ref Range Status   Specimen  Description   Final    URINE, CLEAN  CATCH Performed at [facility], [address] [place], [place] [address]    Special Requests   Final    NONE Performed at [facility], [address] [place], [place] [address]    Culture >=100,000 COLONIES/mL STAPHYLOCOCCUS EPIDERMIDIS (A)  Final   Report Status 11/19/2021 FINAL  Final   Organism ID, Bacteria STAPHYLOCOCCUS EPIDERMIDIS (A)  Final      Susceptibility   Staphylococcus epidermidis - MIC*    CIPROFLOXACIN <=0.5 SENSITIVE Sensitive     GENTAMICIN <=0.5 SENSITIVE Sensitive     NITROFURANTOIN <=16 SENSITIVE Sensitive     OXACILLIN >=4 RESISTANT Resistant     TETRACYCLINE <=1 SENSITIVE Sensitive     VANCOMYCIN 1 SENSITIVE Sensitive     TRIMETH/SULFA <=10 SENSITIVE Sensitive     CLINDAMYCIN <=0.25 SENSITIVE Sensitive     RIFAMPIN <=0.5 SENSITIVE Sensitive     Inducible Clindamycin NEGATIVE Sensitive     * >=100,000 COLONIES/mL STAPHYLOCOCCUS EPIDERMIDIS  Culture, blood (Routine X 2) w Reflex to ID Panel     Status: Abnormal   Collection Time: 11/16/21  6:28 PM   Specimen: BLOOD  Result Value Ref Range Status   Specimen Description   Final    BLOOD LEFT AC Performed at [facility], [address] [place], [place] [address]    Special Requests   Final    BOTTLES DRAWN AEROBIC AND ANAEROBIC Blood Culture results may not be optimal due to an excessive volume of blood received in culture bottles Performed at [facility], [address] [place], [place] [address]    Culture  Setup Time   Final    GRAM POSITIVE COCCI IN BOTH AEROBIC AND ANAEROBIC BOTTLES CRITICAL RESULT CALLED TO, READ BACK BY AND VERIFIED WITH: CARISSA [facility] 11/17/21 1131 AMK    Culture STAPHYLOCOCCUS AUREUS (A)  Final   Report Status 11/19/2021 FINAL  Final   Organism ID, Bacteria STAPHYLOCOCCUS AUREUS  Final      Susceptibility   Staphylococcus aureus - MIC*    CIPROFLOXACIN <=0.5 SENSITIVE Sensitive     ERYTHROMYCIN <=0.25 SENSITIVE Sensitive      GENTAMICIN <=0.5 SENSITIVE Sensitive     OXACILLIN <=0.25 SENSITIVE Sensitive     TETRACYCLINE <=1 SENSITIVE Sensitive     VANCOMYCIN 1 SENSITIVE Sensitive     TRIMETH/SULFA <=10 SENSITIVE Sensitive     CLINDAMYCIN <=0.25 SENSITIVE Sensitive     RIFAMPIN <=0.5 SENSITIVE Sensitive     Inducible Clindamycin NEGATIVE Sensitive     * STAPHYLOCOCCUS AUREUS  Culture, blood (Routine X 2) w Reflex to ID Panel     Status: None (Preliminary result)   Collection Time: 11/16/21  6:28 PM   Specimen: BLOOD RIGHT HAND  Result Value Ref Range Status   Specimen Description BLOOD RIGHT HAND  Final   Special Requests   Final    BOTTLES DRAWN AEROBIC AND ANAEROBIC Blood Culture adequate volume   Culture   Final    NO GROWTH 4 DAYS Performed at [facility], [address], [place], [place] [address]    Report Status PENDING  Incomplete  Blood Culture ID Panel (Reflexed)     Status: Abnormal   Collection Time: 11/16/21  6:28 PM  Result Value Ref Range Status   Enterococcus faecalis NOT DETECTED NOT DETECTED Final   Enterococcus Faecium NOT DETECTED NOT DETECTED Final   Listeria monocytogenes NOT DETECTED NOT DETECTED Final   Staphylococcus  species DETECTED (A) NOT DETECTED Final    Comment: CRITICAL RESULT CALLED TO, READ BACK BY AND VERIFIED WITH: CARISSA DOLAN 11/17/21 1131 AMK    Staphylococcus aureus (BCID) DETECTED (A) NOT DETECTED Final    Comment: CRITICAL RESULT CALLED TO, READ BACK BY AND VERIFIED WITH: CARISSA DOLAN 11/17/21 1131 AMK    Staphylococcus epidermidis NOT DETECTED NOT DETECTED Final   Staphylococcus lugdunensis NOT DETECTED NOT DETECTED Final   Streptococcus species NOT DETECTED NOT DETECTED Final   Streptococcus agalactiae NOT DETECTED NOT DETECTED Final   Streptococcus pneumoniae NOT DETECTED NOT DETECTED Final   Streptococcus pyogenes NOT DETECTED NOT DETECTED Final   A.calcoaceticus-baumannii NOT DETECTED NOT DETECTED Final   Bacteroides fragilis NOT DETECTED  NOT DETECTED Final   Enterobacterales NOT DETECTED NOT DETECTED Final   Enterobacter cloacae complex NOT DETECTED NOT DETECTED Final   Escherichia coli NOT DETECTED NOT DETECTED Final   Klebsiella aerogenes NOT DETECTED NOT DETECTED Final   Klebsiella oxytoca NOT DETECTED NOT DETECTED Final   Klebsiella pneumoniae NOT DETECTED NOT DETECTED Final   Proteus species NOT DETECTED NOT DETECTED Final   Salmonella species NOT DETECTED NOT DETECTED Final   Serratia marcescens NOT DETECTED NOT DETECTED Final   Haemophilus influenzae NOT DETECTED NOT DETECTED Final   Neisseria meningitidis NOT DETECTED NOT DETECTED Final   Pseudomonas aeruginosa NOT DETECTED NOT DETECTED Final   Stenotrophomonas maltophilia NOT DETECTED NOT DETECTED Final   Candida albicans NOT DETECTED NOT DETECTED Final   Candida auris NOT DETECTED NOT DETECTED Final   Candida glabrata NOT DETECTED NOT DETECTED Final   Candida krusei NOT DETECTED NOT DETECTED Final   Candida parapsilosis NOT DETECTED NOT DETECTED Final   Candida tropicalis NOT DETECTED NOT DETECTED Final   Cryptococcus neoformans/gattii NOT DETECTED NOT DETECTED Final   Meth resistant mecA/C and MREJ NOT DETECTED NOT DETECTED Final    Comment: Performed at [facility], [address] [place], [place] [address]  Culture, blood (Routine X 2) w Reflex to ID Panel     Status: None (Preliminary result)   Collection Time: 11/17/21 12:28 PM   Specimen: BLOOD LEFT HAND  Result Value Ref Range Status   Specimen Description BLOOD LEFT HAND  Final   Special Requests   Final    BOTTLES DRAWN AEROBIC AND ANAEROBIC Blood Culture adequate volume   Culture   Final    NO GROWTH 3 DAYS Performed at [facility], [address] [place], [place] [address]    Report Status PENDING  Incomplete  Culture, blood (Routine X 2) w Reflex to ID Panel     Status: None (Preliminary result)   Collection Time: 11/17/21  1:17 PM   Specimen: BLOOD RIGHT FOREARM   Result Value Ref Range Status   Specimen Description BLOOD RIGHT FOREARM  Final   Special Requests   Final    BOTTLES DRAWN AEROBIC AND ANAEROBIC Blood Culture results may not be optimal due to an inadequate volume of blood received in culture bottles   Culture   Final    NO GROWTH 3 DAYS Performed at [facility], [address] [place], [place] [address]    Report Status PENDING  Incomplete    Please note: You were cared for by a hospitalist during your hospital stay. Once you are discharged, your primary care physician will handle any further medical issues. Please note that NO REFILLS for any discharge medications will be authorized once you are discharged, as it is imperative  that you return to your primary care physician (or establish a relationship with a primary care physician if you do not have one) for your post hospital discharge needs so that they can reassess your need for medications and monitor your lab values.    Time coordinating discharge: 40 minutes  SIGNED:   Shelly Coss, MD  [facility] 11/20/2021, 3:14 PM Pager [phone]  If 7PM-7AM, please contact night-coverage [email or web address] Password [email or web address]

## 2021-11-20 NOTE — Treatment Plan (Signed)
Diagnosis: MSSA bacteremia with possible L5-S1 paraspinal very small abscess?  Baseline Creatinine <1    Allergies  Allergen Reactions   Adalat [Nifedipine]     Foot swelling.    Amlodipine Other (See Comments)    Leg swelling    OPAT Orders Discharge antibiotics: Cefazolin 2 grams IV every 8 hours Duration: 6 weeks End Date:12/28/21   PIC Care Per Protocol:  Labs weekly while on IV antibiotics: _X_ CBC with differential  _X_ CMP _X_ CRP _X_ ESR   _X_ Please pull PIC at completion of IV antibiotics   Fax weekly lab results  promptly to [phone]  Clinic Follow Up Appt:12/12/21 at 10.45 Am   Call [phone] with any questions

## 2021-11-20 NOTE — TOC Progression Note (Signed)
Transition of Care [facility]) - Progression Note    Patient Details  Name: John Booker MRN: [id number] Date of Birth: 31-Mar-1950  [facility] CM/SW Contact  Beverly Sessions, RN Phone Number: 11/20/2021, 9:38 AM  Clinical Narrative:      Per MD patient medically ready for discharge.  Auth started in [facility] portal.  ID # [id number]      Expected Discharge Plan and Services                                                 Social Determinants of Health (SDOH) Interventions    Readmission Risk Interventions     No data to display

## 2021-11-20 NOTE — Progress Notes (Signed)
*  PRELIMINARY RESULTS* Echocardiogram Echocardiogram Transesophageal has been performed.  Sherrie Sport 11/20/2021, 8:26 AM

## 2021-11-20 NOTE — Progress Notes (Signed)
Physical Therapy Treatment Patient Details Name: John Booker MRN: [id number] DOB: [birth year] Today's Date: 11/20/2021   History of Present Illness Pt admitted for ARF secondary to rhabo. History includes HTN. Pt reports he tripped and fell going to bathroom at home and was on the floor x 8 hours, unable to get up independently. Recent hospitaliztion for similar issue    PT Comments    Pt is making good progress towards goals with ability to ambulate short distance in hallway using RW. Still demonstrates shuffle gait pattern. Ambulated to [facility], however needs assist for hygiene. Still requires assist for all mobility. Will continue to progress.   Recommendations for follow up therapy are one component of a multi-disciplinary discharge planning process, led by the attending physician.  Recommendations may be updated based on patient status, additional functional criteria and insurance authorization.  Follow Up Recommendations  Skilled nursing-short term rehab (<3 hours/day) Can patient physically be transported by private vehicle: Yes   Assistance Recommended at Discharge Intermittent Supervision/Assistance  Patient can return home with the following A lot of help with walking and/or transfers;A lot of help with bathing/dressing/bathroom   Equipment Recommendations  BSC/3in1    Recommendations for Other Services       Precautions / Restrictions Precautions Precautions: Fall Restrictions Weight Bearing Restrictions: No     Mobility  Bed Mobility Overal bed mobility: Needs Assistance Bed Mobility: Supine to Sit     Supine to sit: Min assist     General bed mobility comments: still requires guidance for B LE management. Once seated at EOB, able to sit with supervision    Transfers Overall transfer level: Needs assistance Equipment used: Rolling walker (2 wheels) Transfers: Sit to/from Stand Sit to Stand: Min assist           General transfer comment: slightly  elevated bed. Good carryover of proper hand positioning    Ambulation/Gait Ambulation/Gait assistance: Min assist Gait Distance (Feet): 80 Feet Assistive device: Rolling walker (2 wheels) Gait Pattern/deviations: Step-to pattern       General Gait Details: shuffle gait pattern with slow technique. Becomes fatigued with exertion. Requested chair, however not used. Standing rest break   Stairs             Wheelchair Mobility    Modified Rankin (Stroke Patients Only)       Balance Overall balance assessment: Needs assistance, History of Falls Sitting-balance support: Feet supported, No upper extremity supported Sitting balance-Leahy Scale: Good     Standing balance support: Bilateral upper extremity supported Standing balance-Leahy Scale: Fair                              Cognition Arousal/Alertness: Awake/alert Behavior During Therapy: Flat affect Overall Cognitive Status: Within Functional Limits for tasks assessed                                 General Comments: flat affect today, however agreeable to session        Exercises Other Exercises Other Exercises: ambulated to [facility] to void, min assist for transfers and hygiene. Follows commands for sequencing    General Comments        Pertinent Vitals/Pain Pain Assessment Pain Assessment: 0-10 Pain Score: 5  Pain Location: low back Pain Descriptors / Indicators: Aching Pain Intervention(s): Limited activity within patient's tolerance, Premedicated before session    Home Living  Prior Function            PT Goals (current goals can now be found in the care plan section) Acute Rehab PT Goals Patient Stated Goal: to go home PT Goal Formulation: With patient Time For Goal Achievement: 12/02/21 Potential to Achieve Goals: Good Progress towards PT goals: Progressing toward goals    Frequency    Min 2X/week      PT Plan Current  plan remains appropriate    Co-evaluation              AM-PAC PT "6 Clicks" Mobility   Outcome Measure  Help needed turning from your back to your side while in a flat bed without using bedrails?: A Little Help needed moving from lying on your back to sitting on the side of a flat bed without using bedrails?: A Little Help needed moving to and from a bed to a chair (including a wheelchair)?: A Little Help needed standing up from a chair using your arms (e.g., wheelchair or bedside chair)?: A Little Help needed to walk in hospital room?: A Little Help needed climbing 3-5 steps with a railing? : A Lot 6 Click Score: 17    End of Session Equipment Utilized During Treatment: Gait belt Activity Tolerance: Patient limited by pain Patient left: in bed;with bed alarm set (still declines chair due to back pain) Nurse Communication: Mobility status PT Visit Diagnosis: Muscle weakness (generalized) (M62.81);History of falling (Z91.81);Difficulty in walking, not elsewhere classified (R26.2);Pain;Unsteadiness on feet (R26.81) Pain - Right/Left:  (midline) Pain - part of body:  (back)     Time: 1610-9604 PT Time Calculation (min) (ACUTE ONLY): 26 min  Charges:  $Gait Training: 8-22 mins $Therapeutic Activity: 8-22 mins                     Greggory Stallion, PT, DPT, [facility] [phone]    John Booker 11/20/2021, 1:22 PM

## 2021-11-20 NOTE — Discharge Planning (Deleted)
Physician Discharge Summary  CLOYD RAGAS LNL:[id number] DOB: 05-Nov-1950 DOA: 11/15/2021  PCP: Dion Body, MD  Admit date: 11/15/2021 Discharge date: 11/20/2021  Admitted From: Home Disposition:  SNF  Discharge Condition:Stable CODE STATUS: DNR Diet recommendation: Heart Healthy   Brief/Interim Summary: ERDEM NAAS is a 71 y.o. male with medical history significant for hypertension, presented to emergency room because of generalized weakness lately and a fall.  The patient was too weak to get up and remained on the ground about 8 hours before he was found..  Lab work showed hypokalemia, creatinine of 2.2, CK was 3521.  Patient was started on IV fluids.  Procalcitonin was found to be elevated.  Blood cultures showed MSSA.  ID consulted.  Started on cefazolin.  Due to echo and TEE did not show any vegetation.  PT/OT recommending SNF nursing facility on discharge.  MRI of lumbar spine did not show any signs of osteomyelitis but small collection on L5-S1 .  ID recommended 6 weeks of IV antibiotics with cefazolin.  Medically stable for discharge to SNF after PICC line.   Following problems were addressed during his hospitalization:  Acute renal failure due to rhabdomyolysis ([place]) Improved with iv fluids  Bacteremia due to methicillin susceptible Staphylococcus aureus (MSSA) Procalcitonin was elevated.  Blood cultures showed MSSA Initially received ceftriaxone and later switched to cefazolin. Repeat blood cultures negative in 24 hours. TTE was negative for any vegetations.  TEE was also negative. ID was following MRI lumbar spine ordered due to his complaint of lower back pain to rule out osteo.It showed  small soft tissue collection/edema around L5-S1. ID recommended 6 weeks of IV antibiotics.  PICC line will be placed. Regimen: cefazolin 2gm IV q8h End date: 12/28/2021   Labs - Once weekly:  CBC/D, CMP, ESR and CRP  Please pull PIC at completion of IV antibiotics  Fax weekly  lab results  promptly to [phone]   He will follow-up with ID on 12/12/2021 at 10:45 AM     Hypokalemia Continue supplementation.  Check BMP in a week   hyponatremia Stable ,monitor    Essential hypertension On losartan, Lasix   GERD without esophagitis - Continue PPI therapy.   Intractable hiccups Patient takes baclofen at home which was resumed.  Apparently a chronic issue which has been investigated, CT chest with concern of mediastinal masses which were followed up by PET scan and they were negative for malignancy. -Treated with Thorazine    Discharge Diagnoses:  Principal Problem:   Acute renal failure due to rhabdomyolysis [facility]) Active Problems:   Bacteremia due to methicillin susceptible Staphylococcus aureus (MSSA)   Hypokalemia   Hyponatremia   Essential hypertension   GERD without esophagitis   Intractable hiccups   Acute renal insufficiency   Non-traumatic rhabdomyolysis    Discharge Instructions  Discharge Instructions     Diet - low sodium heart healthy   Complete by: As directed    Discharge instructions   Complete by: As directed    1)Please take prescribed medications as instructed 2)Do a CBC and BMP test in a week 3)Follow up with infectious disease on 12/12/2021 at 10:45 AM   Home infusion instructions   Complete by: As directed    Instructions: Flushing of vascular access device: 0.9% NaCl pre/post medication administration and prn patency; Heparin 100 u/ml, 32m for implanted ports and Heparin 10u/ml, 535mfor all other central venous catheters.   Increase activity slowly   Complete by: As directed  Allergies as of 11/20/2021       Reactions   Adalat [nifedipine]    Foot swelling.    Amlodipine Other (See Comments)   Leg swelling        Medication List     STOP taking these medications    saccharomyces boulardii 250 MG capsule Commonly known as: FLORASTOR   Vitamin D (Ergocalciferol) 1.25 MG (50000 UNIT) Caps  capsule Commonly known as: DRISDOL       TAKE these medications    acetaminophen 325 MG tablet Commonly known as: TYLENOL Take 2 tablets (650 mg total) by mouth every 6 (six) hours as needed for mild pain or fever.   aspirin EC 81 MG tablet Take 1 tablet (81 mg total) by mouth daily. Swallow whole.   baclofen 10 MG tablet Commonly known as: LIORESAL Take 1 tablet (10 mg total) by mouth 2 (two) times daily.   ceFAZolin  IVPB Commonly known as: ANCEF Inject 2 g into the vein every 8 (eight) hours. Indication:  MSSA bacteremia and possible paraspinal abscess Last Day of Therapy:  12/28/2021 Labs - Once weekly:  CBC/D, CMP, ESR and CRP Please pull PIC at completion of IV antibiotics Fax weekly lab results  promptly to [phone] Method of administration: IV Push Method of administration may be changed at the discretion of home infusion pharmacist based upon assessment of the patient and/or caregiver's ability to self-administer the medication ordered.   cyanocobalamin 1000 MCG tablet Take 1 tablet (1,000 mcg total) by mouth daily.   feeding supplement Liqd Take 237 mLs by mouth 3 (three) times daily between meals.   furosemide 40 MG tablet Commonly known as: LASIX Take 40 mg by mouth daily.   losartan 100 MG tablet Commonly known as: COZAAR Take 1 tablet (100 mg total) by mouth daily.   multivitamin with minerals Tabs tablet Take 1 tablet by mouth daily.   omeprazole 20 MG capsule Commonly known as: PRILOSEC Take 20 mg by mouth in the morning and at bedtime.   potassium chloride SA 20 MEQ tablet Commonly known as: KLOR-CON M Take 2 tablets (40 mEq total) by mouth daily for 3 days. Start taking on: November 21, 2021   sildenafil 20 MG tablet Commonly known as: REVATIO Take 1-5 pills about 30 min prior to sex. Start with 1 and increase as needed.               Home Infusion Instuctions  (From admission, onward)           Start     Ordered    11/20/21 0000  Home infusion instructions       Question:  Instructions  Answer:  Flushing of vascular access device: 0.9% NaCl pre/post medication administration and prn patency; Heparin 100 u/ml, 31m for implanted ports and Heparin 10u/ml, 566mfor all other central venous catheters.   11/20/21 1452            Contact information for after-discharge care     Destination     [facility] Preferred [facility]F .   Service: Skilled Nursing Contact information: [address] [phone]                  Allergies  Allergen Reactions   Adalat [Nifedipine]     Foot swelling.    Amlodipine Other (See Comments)    Leg swelling    Consultations: ID   Procedures/Studies: [place] SITE RITE  Result Date: 11/20/2021 If Site Rite image not attached, placement could not be confirmed due to current cardiac rhythm.  MR LUMBAR SPINE WO CONTRAST  Result Date: 11/20/2021 CLINICAL DATA:  Low back pain infection suspected. EXAM: MRI LUMBAR SPINE WITHOUT CONTRAST TECHNIQUE: Multiplanar, multisequence MR imaging of the lumbar spine was performed. No intravenous contrast was administered. COMPARISON:  PET-CT 11/12/2021 FINDINGS: Segmentation: The last well-formed disc space is designated as L5-S1 with a small rudimentary disc space at S1-S2. Congenitally small AP dimension of the spinal canal. Alignment: Physiologic. Vertebrae: No evidence of vertebral body height loss or endplate erosion. There are scattered degenerative reactive marrow changes. Conus medullaris and cauda equina: Conus extends to the L1 level. Conus and cauda equina appear normal. Paraspinal and other soft tissues: There is soft tissue edema within the paraspinal musculature the S1 vertebral body level with 2 small fluid collections adjacent to the L5-S1 facet joints, measuring up to 0 9 x 2.0 cm on the left (series 7, image 13) and 1.4 x 1.8 cm on the right (series 7, image  5-6). There is also surrounding soft tissue edema adjacent to these fluid collections. These collections are not visualized on PET-CT dated 11/12/2021 Disc levels: T12-L1: No significant spinal stenosis. L1-L2: Mild bilateral facet degenerative change. No spinal canal stenosis. No foraminal stenosis. L2-L3: Mild bilateral facet degenerative change. No spinal canal or foraminal stenosis. L3-L4: Mild bilateral facet degenerative change. No spinal canal or foraminal stenosis. L4-L5: Mild epidural lipomatosis. Moderate to severe bilateral facet degenerative change. Ligamentum flavum hypertrophy. Mild bony spinal canal stenosis. Moderate narrowing of the thecal sac. L5-S1: Small disc bulge with an annular fissure. Ligamentum flavum hypertrophy. Epidural lipomatosis. Mild bony spinal canal narrowing. Moderate narrowing of the thecal sac. IMPRESSION: 1. There are two small soft tissue fluid collections arising from the bilateral L5-S1 facet joints with surrounding soft tissue edema. The collection on the right measures 1.4 x 1.8 cm and the collection on the left measures 0.9 x 2.0 cm. There is no evidence of marrow edema or osseous destruction at the adjacent facet joints. Findings could be degenerative or reactive in nature, but given patient's history of bacteremia and elevated inflammatory markers, early septic arthritis is difficult to exclude. If clinically indicated, this could be further assessed with a contrast enhanced lumbar spine MRI. 2. Lower lumbar spine predominant degenerative disc and facet disease without evidence of high grade bony spinal canal stenosis. Electronically Signed   By: Marin Roberts M.D.   On: 11/20/2021 09:15   ECHO TEE  Result Date: 11/20/2021    TRANSESOPHOGEAL ECHO REPORT   Patient Name:   JULIANO MCEACHIN Date of Exam: 11/20/2021 Medical Rec #:  [id number]      Height:       75.0 in Accession #:    [id number]     Weight:       220.9 lb Date of Birth:  1950-04-21      BSA:          2.289  m Patient Age:    46 years       BP:           179/83 mmHg Patient Gender: M              HR:           85 bpm. Exam Location:  [facility] Procedure: Transesophageal Echo, Cardiac Doppler, Color Doppler and Saline            Contrast Bubble Study  Indications:     Bacteremia R78.81  History:         Patient has prior history of Echocardiogram examinations, most                  recent 11/18/2021. Risk Factors:Hypertension.  Sonographer:     Sherrie Sport Referring Phys:  Alanson Puls TANG Diagnosing Phys: Serafina Royals MD PROCEDURE: The transesophogeal probe was passed without difficulty through the esophogus of the patient. Sedation performed by performing physician. The patient developed no complications during the procedure. IMPRESSIONS  1. Left ventricular ejection fraction, by estimation, is 60 to 65%. The left ventricle has normal function.  2. Right ventricular systolic function is normal. The right ventricular size is normal.  3. No left atrial/left atrial appendage thrombus was detected.  4. The mitral valve is normal in structure. Trivial mitral valve regurgitation.  5. The aortic valve is normal in structure. Aortic valve regurgitation is not visualized.  6. Agitated saline contrast bubble study was negative, with no evidence of any interatrial shunt. FINDINGS  Left Ventricle: Left ventricular ejection fraction, by estimation, is 60 to 65%. The left ventricle has normal function. The left ventricular internal cavity size was small. Right Ventricle: The right ventricular size is normal. No increase in right ventricular wall thickness. Right ventricular systolic function is normal. Left Atrium: Left atrial size was normal in size. No left atrial/left atrial appendage thrombus was detected. Right Atrium: Right atrial size was normal in size. Pericardium: There is no evidence of pericardial effusion. Mitral Valve: The mitral valve is normal in structure. Trivial mitral valve regurgitation. There is no evidence of  mitral valve vegetation. Tricuspid Valve: The tricuspid valve is normal in structure. Tricuspid valve regurgitation is trivial. There is no evidence of tricuspid valve vegetation. Aortic Valve: The aortic valve is normal in structure. Aortic valve regurgitation is not visualized. There is no evidence of aortic valve vegetation. Pulmonic Valve: The pulmonic valve was normal in structure. Pulmonic valve regurgitation is trivial. There is no evidence of pulmonic valve vegetation. Aorta: The aortic root and ascending aorta are structurally normal, with no evidence of dilitation. There is minimal (Grade I) plaque. IAS/Shunts: No atrial level shunt detected by color flow Doppler. Agitated saline contrast was given intravenously to evaluate for intracardiac shunting. Agitated saline contrast bubble study was negative, with no evidence of any interatrial shunt. There  is no evidence of a patent foramen ovale. No ventricular septal defect is seen or detected. There is no evidence of an atrial septal defect. Serafina Royals MD Electronically signed by Serafina Royals MD Signature Date/Time: 11/20/2021/8:31:34 AM    Final    ECHOCARDIOGRAM COMPLETE  Result Date: 11/18/2021    ECHOCARDIOGRAM REPORT   Patient Name:   NINA HOAR Date of Exam: 11/18/2021 Medical Rec #:  [id number]      Height:       75.0 in Accession #:    [id number]     Weight:       220.9 lb Date of Birth:  05/04/1950      BSA:          2.289 m Patient Age:    71 years       BP:           164/79 mmHg Patient Gender: M              HR:           94 bpm. Exam Location:  [facility] Procedure: Transesophageal Echo,  Color Doppler and Cardiac Doppler Indications:     Bacteremia R78.81  History:         Patient has prior history of Echocardiogram examinations, most                  recent 04/11/2021. Risk Factors:Hypertension.  Sonographer:     Sherrie Sport Referring Phys:  [id number] [facility] AMIN Diagnosing Phys: Kathlyn Sacramento MD  Sonographer Comments: Technically challenging  study due to limited acoustic windows, suboptimal apical window and no subcostal window. IMPRESSIONS  1. Left ventricular ejection fraction, by estimation, is 60 to 65%. The left ventricle has normal function. The left ventricle has no regional wall motion abnormalities. There is mild left ventricular hypertrophy. Left ventricular diastolic parameters are consistent with Grade I diastolic dysfunction (impaired relaxation).  2. Right ventricular systolic function is normal. The right ventricular size is normal. Tricuspid regurgitation signal is inadequate for assessing PA pressure.  3. The mitral valve is normal in structure. No evidence of mitral valve regurgitation. No evidence of mitral stenosis.  4. The aortic valve is normal in structure. Aortic valve regurgitation is not visualized. No aortic stenosis is present. Conclusion(s)/Recommendation(s): No evidence of valvular vegetations on this transthoracic echocardiogram. Suboptimal images. FINDINGS  Left Ventricle: Left ventricular ejection fraction, by estimation, is 60 to 65%. The left ventricle has normal function. The left ventricle has no regional wall motion abnormalities. The left ventricular internal cavity size was normal in size. There is  mild left ventricular hypertrophy. Left ventricular diastolic parameters are consistent with Grade I diastolic dysfunction (impaired relaxation). Right Ventricle: The right ventricular size is normal. No increase in right ventricular wall thickness. Right ventricular systolic function is normal. Tricuspid regurgitation signal is inadequate for assessing PA pressure. Left Atrium: Left atrial size was normal in size. Right Atrium: Right atrial size was normal in size. Pericardium: Trivial pericardial effusion is present. Mitral Valve: The mitral valve is normal in structure. No evidence of mitral valve regurgitation. No evidence of mitral valve stenosis. Tricuspid Valve: The tricuspid valve is normal in structure.  Tricuspid valve regurgitation is not demonstrated. No evidence of tricuspid stenosis. Aortic Valve: The aortic valve is normal in structure. Aortic valve regurgitation is not visualized. No aortic stenosis is present. Aortic valve mean gradient measures 2.0 mmHg. Aortic valve peak gradient measures 4.0 mmHg. Aortic valve area, by VTI measures 3.07 cm. Pulmonic Valve: The pulmonic valve was normal in structure. Pulmonic valve regurgitation is not visualized. No evidence of pulmonic stenosis. Aorta: The aortic root is normal in size and structure. Venous: The inferior vena cava was not well visualized. IAS/Shunts: No atrial level shunt detected by color flow Doppler.  LEFT VENTRICLE PLAX 2D LVIDd:         3.60 cm   Diastology LVIDs:         2.30 cm   LV e' medial:    4.90 cm/s LV PW:         1.20 cm   LV E/e' medial:  17.2 LV IVS:        1.20 cm   LV e' lateral:   9.79 cm/s LVOT diam:     2.00 cm   LV E/e' lateral: 8.6 LV SV:         55 LV SV Index:   24 LVOT Area:     3.14 cm  RIGHT VENTRICLE RV Basal diam:  3.50 cm RV S prime:     14.60 cm/s TAPSE (M-mode): 2.9 cm LEFT ATRIUM  Index        RIGHT ATRIUM           Index LA diam:      3.10 cm 1.35 cm/m   RA Area:     14.10 cm LA Vol (A4C): 39.6 ml 17.30 ml/m  RA Volume:   31.10 ml  13.59 ml/m  AORTIC VALVE AV Area (Vmax):    2.46 cm AV Area (Vmean):   2.44 cm AV Area (VTI):     3.07 cm AV Vmax:           100.00 cm/s AV Vmean:          68.200 cm/s AV VTI:            0.180 m AV Peak Grad:      4.0 mmHg AV Mean Grad:      2.0 mmHg LVOT Vmax:         78.30 cm/s LVOT Vmean:        52.900 cm/s LVOT VTI:          0.176 m LVOT/AV VTI ratio: 0.98  AORTA Ao Root diam: 3.60 cm MITRAL VALVE                TRICUSPID VALVE MV Area (PHT): 4.39 cm     TR Peak grad:   8.8 mmHg MV Decel Time: 173 msec     TR Vmax:        148.00 cm/s MV E velocity: 84.40 cm/s MV A velocity: 129.00 cm/s  SHUNTS MV E/A ratio:  0.65         Systemic VTI:  0.18 m                              Systemic Diam: 2.00 cm Kathlyn Sacramento MD Electronically signed by Kathlyn Sacramento MD Signature Date/Time: 11/18/2021/1:52:49 PM    Final    CT Cervical Spine Wo Contrast  Result Date: 11/15/2021 CLINICAL DATA:  Fall at home.  Neck trauma. EXAM: CT CERVICAL SPINE WITHOUT CONTRAST TECHNIQUE: Multidetector CT imaging of the cervical spine was performed without intravenous contrast. Multiplanar CT image reconstructions were also generated. RADIATION DOSE REDUCTION: This exam was performed according to the departmental dose-optimization program which includes automated exposure control, adjustment of the mA and/or kV according to patient size and/or use of iterative reconstruction technique. COMPARISON:  MRI of the cervical spine 12/29/2020 FINDINGS: Alignment: No significant listhesis is present. Straightening of the normal cervical lordosis is again noted. Skull base and vertebrae: Craniocervical junction is within normal limits. Vertebral body heights are maintained. No acute fractures are present. Soft tissues and spinal canal: No prevertebral fluid or swelling. No visible canal hematoma. 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 Moderate left foraminal stenosis at C3-4 and right foraminal stenosis at C4-5 is stable. Moderate foraminal narrowing bilaterally at C5-6 is stable. Upper chest: Lung apices are clear. The thoracic inlet is within normal limits. IMPRESSION: 1. No acute fracture or traumatic subluxation. 2. 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 3. Multilevel foraminal stenosis as described. Electronically Signed   By: San Morelle M.D.   On: 11/15/2021 18:43   DG Chest Portable 1 View  Result Date: 11/15/2021 CLINICAL DATA:  Fall in bathroom.  Weakness. EXAM: PORTABLE CHEST 1  VIEW COMPARISON:   06/27/2011 FINDINGS: The heart size and mediastinal contours are within normal limits. Both lungs are clear. No evidence of pneumothorax or hemothorax. The visualized skeletal structures are unremarkable. IMPRESSION: No active disease. Electronically Signed   By: Marlaine Hind M.D.   On: 11/15/2021 18:40   CT HEAD WO CONTRAST (5MM)  Result Date: 11/15/2021 CLINICAL DATA:  Fall at home while going to the bathroom. No loss of consciousness. Patient denies hitting head. EXAM: CT HEAD WITHOUT CONTRAST TECHNIQUE: Contiguous axial images were obtained from the base of the skull through the vertex without intravenous contrast. RADIATION DOSE REDUCTION: This exam was performed according to the departmental dose-optimization program which includes automated exposure control, adjustment of the mA and/or kV according to patient size and/or use of iterative reconstruction technique. COMPARISON:  CT head without contrast 06/26/2021 MR head without contrast 12/29/2020 FINDINGS: Brain: No acute infarct, hemorrhage, or mass lesion is present. Mild atrophy and white matter changes are stable, likely within normal limits for age. The ventricles are of normal size. No significant extraaxial fluid collection is present. The brainstem and cerebellum are within normal limits. Vascular: No hyperdense vessel or unexpected calcification. Skull: No significant extracranial soft tissue lesion is present. Calvarium is intact. No focal lytic or blastic lesions are present. Cerumen is present in the external auditory canals bilaterally. Sinuses/Orbits: The paranasal sinuses and mastoid air cells are clear. The globes and orbits are within normal limits. IMPRESSION: 1. No acute intracranial abnormality or significant interval change. 2. Stable atrophy and white matter disease, likely within normal limits for age. Electronically Signed   By: San Morelle M.D.   On: 11/15/2021 18:40   NM PET Image Initial (PI) Skull Base To  Thigh  Result Date: 11/13/2021 CLINICAL DATA:  Initial treatment strategy for mediastinal mass and possible esophageal lesion. EXAM: NUCLEAR MEDICINE PET SKULL BASE TO THIGH TECHNIQUE: 12.6 mCi F-18 FDG was injected intravenously. Full-ring PET imaging was performed from the skull base to thigh after the radiotracer. CT data was obtained and used for attenuation correction and anatomic localization. Fasting blood glucose: 104 mg/dl COMPARISON:  CT on 10/01/2021 FINDINGS: Mediastinal blood-pool activity (background): SUV max = 1.8 Liver activity (reference): SUV max = N/A NECK:  No hypermetabolic lymph nodes or masses. Incidental CT findings:  None. CHEST: No hypermetabolic lymph nodes or other masses. No suspicious pulmonary nodules seen on CT images. Mixed fat and soft tissue attenuation masses are again seen in the left thoracic paraspinal regions, but show absence of hypermetabolic activity suggesting benign lesions such as extramedullary hematopoiesis. No evidence of hypermetabolism involving the esophagus. Incidental CT findings:  None. ABDOMEN/PELVIS: No abnormal hypermetabolic activity within the liver, pancreas, adrenal glands, or spleen. No hypermetabolic lymph nodes in the abdomen or pelvis. Incidental CT findings: Stable mildly enlarged prostate gland. Diverticulosis is seen mainly involving the sigmoid colon, however there is no evidence of diverticulitis. Aortic atherosclerotic calcification incidentally noted. SKELETON: No focal hypermetabolic bone lesions to suggest skeletal metastasis. Incidental CT findings:  None. IMPRESSION: No evidence of malignancy. Left thoracic paraspinal mixed fat and soft tissue attenuation masses show absence of hypermetabolism, consistent with benign etiology such as extramedullary hematopoiesis. Suggest continued follow-up by chest CT in 6 months. Electronically Signed   By: Marlaine Hind M.D.   On: 11/13/2021 13:33      Subjective: Patient seen and examined at  bedside today.  Hemodynamically stable.  Medically stable for discharge after PICC line placement.  Discharge Exam: Vitals:  11/20/21 0830 11/20/21 0845  BP: (!) 156/77 (!) 147/74  Pulse: 95 85  Resp: (!) 27 17  Temp:    SpO2: 95% 94%   Vitals:   11/20/21 0813 11/20/21 0824 11/20/21 0830 11/20/21 0845  BP: (!) 164/90 (!) 165/84 (!) 156/77 (!) 147/74  Pulse:  92 95 85  Resp:  (!) 27 (!) 27 17  Temp:      TempSrc:      SpO2:  95% 95% 94%  Weight:      Height:        General: Pt is alert, awake, not in acute distress Cardiovascular: RRR, S1/S2 +, no rubs, no gallops Respiratory: CTA bilaterally, no wheezing, no rhonchi Abdominal: Soft, NT, ND, bowel sounds + Extremities: no edema, no cyanosis    The results of significant diagnostics from this hospitalization (including imaging, microbiology, ancillary and laboratory) are listed below for reference.     Microbiology: Recent Results (from the past 240 hour(s))  SARS Coronavirus 2 by RT PCR (hospital order, performed in [facility] hospital lab) *cepheid single result test* Anterior Nasal Swab     Status: None   Collection Time: 11/15/21  7:11 PM   Specimen: Anterior Nasal Swab  Result Value Ref Range Status   SARS Coronavirus 2 by RT PCR NEGATIVE NEGATIVE Final    Comment: (NOTE) SARS-CoV-2 target nucleic acids are NOT DETECTED.  The SARS-CoV-2 RNA is generally detectable in upper and lower respiratory specimens during the acute phase of infection. The lowest concentration of SARS-CoV-2 viral copies this assay can detect is 250 copies / mL. A negative result does not preclude SARS-CoV-2 infection and should not be used as the sole basis for treatment or other patient management decisions.  A negative result may occur with improper specimen collection / handling, submission of specimen other than nasopharyngeal swab, presence of viral mutation(s) within the areas targeted by this assay, and inadequate number of viral  copies (<250 copies / mL). A negative result must be combined with clinical observations, patient history, and epidemiological information.  Fact Sheet for Patients:   https://www.patel.info/  Fact Sheet for Healthcare Providers: https://hall.com/  This test is not yet approved or  cleared by the [place] FDA and has been authorized for detection and/or diagnosis of SARS-CoV-2 by FDA under an Emergency Use Authorization (EUA).  This EUA will remain in effect (meaning this test can be used) for the duration of the COVID-19 declaration under Section 564(b)(1) of the Act, 21 U.S.C. section 360bbb-3(b)(1), unless the authorization is terminated or revoked sooner.  Performed at [facility], [address], [place], [place] [address]   Urine Culture     Status: Abnormal   Collection Time: 11/16/21  3:46 PM   Specimen: Urine, Clean Catch  Result Value Ref Range Status   Specimen Description   Final    URINE, CLEAN CATCH Performed at [facility], [address], [place], [place] [address]    Special Requests   Final    NONE Performed at [facility], [address] [place], [place] [address]    Culture >=100,000 COLONIES/mL STAPHYLOCOCCUS EPIDERMIDIS (A)  Final   Report Status 11/19/2021 FINAL  Final   Organism ID, Bacteria STAPHYLOCOCCUS EPIDERMIDIS (A)  Final      Susceptibility   Staphylococcus epidermidis - MIC*    CIPROFLOXACIN <=0.5 SENSITIVE Sensitive     GENTAMICIN <=0.5 SENSITIVE Sensitive     NITROFURANTOIN <=16 SENSITIVE Sensitive     OXACILLIN >=4  RESISTANT Resistant     TETRACYCLINE <=1 SENSITIVE Sensitive     VANCOMYCIN 1 SENSITIVE Sensitive     TRIMETH/SULFA <=10 SENSITIVE Sensitive     CLINDAMYCIN <=0.25 SENSITIVE Sensitive     RIFAMPIN <=0.5 SENSITIVE Sensitive     Inducible Clindamycin NEGATIVE Sensitive     * >=100,000 COLONIES/mL STAPHYLOCOCCUS EPIDERMIDIS  Culture, blood  (Routine X 2) w Reflex to ID Panel     Status: Abnormal   Collection Time: 11/16/21  6:28 PM   Specimen: BLOOD  Result Value Ref Range Status   Specimen Description   Final    BLOOD LEFT AC Performed at [facility], [address] [place], [place] [address]    Special Requests   Final    BOTTLES DRAWN AEROBIC AND ANAEROBIC Blood Culture results may not be optimal due to an excessive volume of blood received in culture bottles Performed at [facility], [address] [place], [place] [address]    Culture  Setup Time   Final    GRAM POSITIVE COCCI IN BOTH AEROBIC AND ANAEROBIC BOTTLES CRITICAL RESULT CALLED TO, READ BACK BY AND VERIFIED WITH: CARISSA [facility] 11/17/21 1131 AMK    Culture STAPHYLOCOCCUS AUREUS (A)  Final   Report Status 11/19/2021 FINAL  Final   Organism ID, Bacteria STAPHYLOCOCCUS AUREUS  Final      Susceptibility   Staphylococcus aureus - MIC*    CIPROFLOXACIN <=0.5 SENSITIVE Sensitive     ERYTHROMYCIN <=0.25 SENSITIVE Sensitive     GENTAMICIN <=0.5 SENSITIVE Sensitive     OXACILLIN <=0.25 SENSITIVE Sensitive     TETRACYCLINE <=1 SENSITIVE Sensitive     VANCOMYCIN 1 SENSITIVE Sensitive     TRIMETH/SULFA <=10 SENSITIVE Sensitive     CLINDAMYCIN <=0.25 SENSITIVE Sensitive     RIFAMPIN <=0.5 SENSITIVE Sensitive     Inducible Clindamycin NEGATIVE Sensitive     * STAPHYLOCOCCUS AUREUS  Culture, blood (Routine X 2) w Reflex to ID Panel     Status: None (Preliminary result)   Collection Time: 11/16/21  6:28 PM   Specimen: BLOOD RIGHT HAND  Result Value Ref Range Status   Specimen Description BLOOD RIGHT HAND  Final   Special Requests   Final    BOTTLES DRAWN AEROBIC AND ANAEROBIC Blood Culture adequate volume   Culture   Final    NO GROWTH 4 DAYS Performed at [facility], [address] [place], [place] [address]    Report Status PENDING  Incomplete  Blood Culture ID Panel (Reflexed)     Status: Abnormal   Collection Time:  11/16/21  6:28 PM  Result Value Ref Range Status   Enterococcus faecalis NOT DETECTED NOT DETECTED Final   Enterococcus Faecium NOT DETECTED NOT DETECTED Final   Listeria monocytogenes NOT DETECTED NOT DETECTED Final   Staphylococcus species DETECTED (A) NOT DETECTED Final    Comment: CRITICAL RESULT CALLED TO, READ BACK BY AND VERIFIED WITH: CARISSA DOLAN 11/17/21 1131 AMK    Staphylococcus aureus (BCID) DETECTED (A) NOT DETECTED Final    Comment: CRITICAL RESULT CALLED TO, READ BACK BY AND VERIFIED WITH: CARISSA DOLAN 11/17/21 1131 AMK    Staphylococcus epidermidis NOT DETECTED NOT DETECTED Final   Staphylococcus lugdunensis NOT DETECTED NOT DETECTED Final   Streptococcus species NOT DETECTED NOT DETECTED Final   Streptococcus agalactiae NOT DETECTED NOT DETECTED Final   Streptococcus pneumoniae NOT DETECTED NOT DETECTED Final   Streptococcus pyogenes NOT DETECTED NOT DETECTED Final   A.calcoaceticus-baumannii NOT DETECTED NOT  DETECTED Final   Bacteroides fragilis NOT DETECTED NOT DETECTED Final   Enterobacterales NOT DETECTED NOT DETECTED Final   Enterobacter cloacae complex NOT DETECTED NOT DETECTED Final   Escherichia coli NOT DETECTED NOT DETECTED Final   Klebsiella aerogenes NOT DETECTED NOT DETECTED Final   Klebsiella oxytoca NOT DETECTED NOT DETECTED Final   Klebsiella pneumoniae NOT DETECTED NOT DETECTED Final   Proteus species NOT DETECTED NOT DETECTED Final   Salmonella species NOT DETECTED NOT DETECTED Final   Serratia marcescens NOT DETECTED NOT DETECTED Final   Haemophilus influenzae NOT DETECTED NOT DETECTED Final   Neisseria meningitidis NOT DETECTED NOT DETECTED Final   Pseudomonas aeruginosa NOT DETECTED NOT DETECTED Final   Stenotrophomonas maltophilia NOT DETECTED NOT DETECTED Final   Candida albicans NOT DETECTED NOT DETECTED Final   Candida auris NOT DETECTED NOT DETECTED Final   Candida glabrata NOT DETECTED NOT DETECTED Final   Candida krusei NOT DETECTED  NOT DETECTED Final   Candida parapsilosis NOT DETECTED NOT DETECTED Final   Candida tropicalis NOT DETECTED NOT DETECTED Final   Cryptococcus neoformans/gattii NOT DETECTED NOT DETECTED Final   Meth resistant mecA/C and MREJ NOT DETECTED NOT DETECTED Final    Comment: Performed at [facility], [address] [place], [place] [address]  Culture, blood (Routine X 2) w Reflex to ID Panel     Status: None (Preliminary result)   Collection Time: 11/17/21 12:28 PM   Specimen: BLOOD LEFT HAND  Result Value Ref Range Status   Specimen Description BLOOD LEFT HAND  Final   Special Requests   Final    BOTTLES DRAWN AEROBIC AND ANAEROBIC Blood Culture adequate volume   Culture   Final    NO GROWTH 3 DAYS Performed at [facility], [address] [place], [place] [address]    Report Status PENDING  Incomplete  Culture, blood (Routine X 2) w Reflex to ID Panel     Status: None (Preliminary result)   Collection Time: 11/17/21  1:17 PM   Specimen: BLOOD RIGHT FOREARM  Result Value Ref Range Status   Specimen Description BLOOD RIGHT FOREARM  Final   Special Requests   Final    BOTTLES DRAWN AEROBIC AND ANAEROBIC Blood Culture results may not be optimal due to an inadequate volume of blood received in culture bottles   Culture   Final    NO GROWTH 3 DAYS Performed at [facility], [address] [place], [place] [address]    Report Status PENDING  Incomplete     Labs: BNP (last 3 results) Recent Labs    12/26/20 0819 06/26/21 1345  BNP 218.2* 40.7   Basic Metabolic Panel: Recent Labs  Lab 11/15/21 1911 11/16/21 0535 11/17/21 0500 11/19/21 0508 11/20/21 0522  NA 129* 131* 131* 134* 135  K 2.9* 3.1* 3.5 3.1* 3.1*  CL 86* 91* 96* 97* 97*  CO2 26 29 28 29 31   GLUCOSE 146* 124* 96 119* 95  BUN 29* 31* 23 14 12   CREATININE 2.23* 1.81* 1.13 0.85 0.82  CALCIUM 8.6* 7.8* 7.4* 8.3* 8.5*  MG  --  1.6*  --  2.1 1.9   Liver Function Tests: Recent Labs   Lab 11/15/21 1911  AST 98*  ALT 27  ALKPHOS 65  BILITOT 2.3*  PROT 6.7  ALBUMIN 3.6   No results for input(s): "LIPASE", "AMYLASE" in the last 168 hours. No results for input(s): "AMMONIA" in the last 168 hours. CBC: Recent Labs  Lab 11/15/21  1911 11/16/21 0535 11/17/21 0500  WBC 13.2* 8.6 8.6  NEUTROABS 12.2*  --  7.2  HGB 17.0 15.9 14.4  HCT 49.6 47.0 42.3  MCV 94.3 95.1 95.5  PLT 186 150 147*   Cardiac Enzymes: Recent Labs  Lab 11/16/21 0535 11/17/21 0500 11/18/21 0453 11/19/21 0508 11/20/21 0522  CKTOTAL 6,455* 3,511* 1,593* 772* 348   BNP: Invalid input(s): "POCBNP" CBG: No results for input(s): "GLUCAP" in the last 168 hours. D-Dimer No results for input(s): "DDIMER" in the last 72 hours. Hgb A1c No results for input(s): "HGBA1C" in the last 72 hours. Lipid Profile No results for input(s): "CHOL", "HDL", "LDLCALC", "TRIG", "CHOLHDL", "LDLDIRECT" in the last 72 hours. Thyroid function studies No results for input(s): "TSH", "T4TOTAL", "T3FREE", "THYROIDAB" in the last 72 hours.  Invalid input(s): "FREET3" Anemia work up No results for input(s): "VITAMINB12", "FOLATE", "FERRITIN", "TIBC", "IRON", "RETICCTPCT" in the last 72 hours. Urinalysis    Component Value Date/Time   COLORURINE AMBER (A) 11/15/2021 1911   APPEARANCEUR CLOUDY (A) 11/15/2021 1911   APPEARANCEUR Clear 10/29/2021 1034   LABSPEC 1.019 11/15/2021 1911   PHURINE 5.0 11/15/2021 1911   GLUCOSEU 50 (A) 11/15/2021 1911   HGBUR LARGE (A) 11/15/2021 1911   BILIRUBINUR NEGATIVE 11/15/2021 1911   BILIRUBINUR Negative 10/29/2021 1034   KETONESUR 5 (A) 11/15/2021 1911   PROTEINUR 100 (A) 11/15/2021 1911   NITRITE NEGATIVE 11/15/2021 1911   LEUKOCYTESUR TRACE (A) 11/15/2021 1911   Sepsis Labs Recent Labs  Lab 11/15/21 1911 11/16/21 0535 11/17/21 0500  WBC 13.2* 8.6 8.6   Microbiology Recent Results (from the past 240 hour(s))  SARS Coronavirus 2 by RT PCR (hospital order,  performed in [place] hospital lab) *cepheid single result test* Anterior Nasal Swab     Status: None   Collection Time: 11/15/21  7:11 PM   Specimen: Anterior Nasal Swab  Result Value Ref Range Status   SARS Coronavirus 2 by RT PCR NEGATIVE NEGATIVE Final    Comment: (NOTE) SARS-CoV-2 target nucleic acids are NOT DETECTED.  The SARS-CoV-2 RNA is generally detectable in upper and lower respiratory specimens during the acute phase of infection. The lowest concentration of SARS-CoV-2 viral copies this assay can detect is 250 copies / mL. A negative result does not preclude SARS-CoV-2 infection and should not be used as the sole basis for treatment or other patient management decisions.  A negative result may occur with improper specimen collection / handling, submission of specimen other than nasopharyngeal swab, presence of viral mutation(s) within the areas targeted by this assay, and inadequate number of viral copies (<250 copies / mL). A negative result must be combined with clinical observations, patient history, and epidemiological information.  Fact Sheet for Patients:   https://www.patel.info/  Fact Sheet for Healthcare Providers: https://hall.com/  This test is not yet approved or  cleared by the [place] FDA and has been authorized for detection and/or diagnosis of SARS-CoV-2 by FDA under an Emergency Use Authorization (EUA).  This EUA will remain in effect (meaning this test can be used) for the duration of the COVID-19 declaration under Section 564(b)(1) of the Act, 21 U.S.C. section 360bbb-3(b)(1), unless the authorization is terminated or revoked sooner.  Performed at [facility], [address], [place], [place] [address]   Urine Culture     Status: Abnormal   Collection Time: 11/16/21  3:46 PM   Specimen: Urine, Clean Catch  Result Value Ref Range Status   Specimen Description   Final  URINE, CLEAN  CATCH Performed at [facility], [address] [place], [place] [address]    Special Requests   Final    NONE Performed at [facility], [address] [place], [place] [address]    Culture >=100,000 COLONIES/mL STAPHYLOCOCCUS EPIDERMIDIS (A)  Final   Report Status 11/19/2021 FINAL  Final   Organism ID, Bacteria STAPHYLOCOCCUS EPIDERMIDIS (A)  Final      Susceptibility   Staphylococcus epidermidis - MIC*    CIPROFLOXACIN <=0.5 SENSITIVE Sensitive     GENTAMICIN <=0.5 SENSITIVE Sensitive     NITROFURANTOIN <=16 SENSITIVE Sensitive     OXACILLIN >=4 RESISTANT Resistant     TETRACYCLINE <=1 SENSITIVE Sensitive     VANCOMYCIN 1 SENSITIVE Sensitive     TRIMETH/SULFA <=10 SENSITIVE Sensitive     CLINDAMYCIN <=0.25 SENSITIVE Sensitive     RIFAMPIN <=0.5 SENSITIVE Sensitive     Inducible Clindamycin NEGATIVE Sensitive     * >=100,000 COLONIES/mL STAPHYLOCOCCUS EPIDERMIDIS  Culture, blood (Routine X 2) w Reflex to ID Panel     Status: Abnormal   Collection Time: 11/16/21  6:28 PM   Specimen: BLOOD  Result Value Ref Range Status   Specimen Description   Final    BLOOD LEFT AC Performed at [facility], [address], [place], [place] [address]    Special Requests   Final    BOTTLES DRAWN AEROBIC AND ANAEROBIC Blood Culture results may not be optimal due to an excessive volume of blood received in culture bottles Performed at [facility], [address] [place], [place] [address]    Culture  Setup Time   Final    GRAM POSITIVE COCCI IN BOTH AEROBIC AND ANAEROBIC BOTTLES CRITICAL RESULT CALLED TO, READ BACK BY AND VERIFIED WITH: CARISSA [facility] 11/17/21 1131 AMK    Culture STAPHYLOCOCCUS AUREUS (A)  Final   Report Status 11/19/2021 FINAL  Final   Organism ID, Bacteria STAPHYLOCOCCUS AUREUS  Final      Susceptibility   Staphylococcus aureus - MIC*    CIPROFLOXACIN <=0.5 SENSITIVE Sensitive     ERYTHROMYCIN <=0.25 SENSITIVE Sensitive      GENTAMICIN <=0.5 SENSITIVE Sensitive     OXACILLIN <=0.25 SENSITIVE Sensitive     TETRACYCLINE <=1 SENSITIVE Sensitive     VANCOMYCIN 1 SENSITIVE Sensitive     TRIMETH/SULFA <=10 SENSITIVE Sensitive     CLINDAMYCIN <=0.25 SENSITIVE Sensitive     RIFAMPIN <=0.5 SENSITIVE Sensitive     Inducible Clindamycin NEGATIVE Sensitive     * STAPHYLOCOCCUS AUREUS  Culture, blood (Routine X 2) w Reflex to ID Panel     Status: None (Preliminary result)   Collection Time: 11/16/21  6:28 PM   Specimen: BLOOD RIGHT HAND  Result Value Ref Range Status   Specimen Description BLOOD RIGHT HAND  Final   Special Requests   Final    BOTTLES DRAWN AEROBIC AND ANAEROBIC Blood Culture adequate volume   Culture   Final    NO GROWTH 4 DAYS Performed at [facility], [address] [place], [place] [address]    Report Status PENDING  Incomplete  Blood Culture ID Panel (Reflexed)     Status: Abnormal   Collection Time: 11/16/21  6:28 PM  Result Value Ref Range Status   Enterococcus faecalis NOT DETECTED NOT DETECTED Final   Enterococcus Faecium NOT DETECTED NOT DETECTED Final   Listeria monocytogenes NOT DETECTED NOT DETECTED Final   Staphylococcus species DETECTED (A) NOT DETECTED Final  Comment: CRITICAL RESULT CALLED TO, READ BACK BY AND VERIFIED WITH: CARISSA DOLAN 11/17/21 1131 AMK    Staphylococcus aureus (BCID) DETECTED (A) NOT DETECTED Final    Comment: CRITICAL RESULT CALLED TO, READ BACK BY AND VERIFIED WITH: CARISSA DOLAN 11/17/21 1131 AMK    Staphylococcus epidermidis NOT DETECTED NOT DETECTED Final   Staphylococcus lugdunensis NOT DETECTED NOT DETECTED Final   Streptococcus species NOT DETECTED NOT DETECTED Final   Streptococcus agalactiae NOT DETECTED NOT DETECTED Final   Streptococcus pneumoniae NOT DETECTED NOT DETECTED Final   Streptococcus pyogenes NOT DETECTED NOT DETECTED Final   A.calcoaceticus-baumannii NOT DETECTED NOT DETECTED Final   Bacteroides fragilis NOT DETECTED  NOT DETECTED Final   Enterobacterales NOT DETECTED NOT DETECTED Final   Enterobacter cloacae complex NOT DETECTED NOT DETECTED Final   Escherichia coli NOT DETECTED NOT DETECTED Final   Klebsiella aerogenes NOT DETECTED NOT DETECTED Final   Klebsiella oxytoca NOT DETECTED NOT DETECTED Final   Klebsiella pneumoniae NOT DETECTED NOT DETECTED Final   Proteus species NOT DETECTED NOT DETECTED Final   Salmonella species NOT DETECTED NOT DETECTED Final   Serratia marcescens NOT DETECTED NOT DETECTED Final   Haemophilus influenzae NOT DETECTED NOT DETECTED Final   Neisseria meningitidis NOT DETECTED NOT DETECTED Final   Pseudomonas aeruginosa NOT DETECTED NOT DETECTED Final   Stenotrophomonas maltophilia NOT DETECTED NOT DETECTED Final   Candida albicans NOT DETECTED NOT DETECTED Final   Candida auris NOT DETECTED NOT DETECTED Final   Candida glabrata NOT DETECTED NOT DETECTED Final   Candida krusei NOT DETECTED NOT DETECTED Final   Candida parapsilosis NOT DETECTED NOT DETECTED Final   Candida tropicalis NOT DETECTED NOT DETECTED Final   Cryptococcus neoformans/gattii NOT DETECTED NOT DETECTED Final   Meth resistant mecA/C and MREJ NOT DETECTED NOT DETECTED Final    Comment: Performed at [facility], [address] [place], [place] [address]  Culture, blood (Routine X 2) w Reflex to ID Panel     Status: None (Preliminary result)   Collection Time: 11/17/21 12:28 PM   Specimen: BLOOD LEFT HAND  Result Value Ref Range Status   Specimen Description BLOOD LEFT HAND  Final   Special Requests   Final    BOTTLES DRAWN AEROBIC AND ANAEROBIC Blood Culture adequate volume   Culture   Final    NO GROWTH 3 DAYS Performed at [facility], [address] [place], [place] [address]    Report Status PENDING  Incomplete  Culture, blood (Routine X 2) w Reflex to ID Panel     Status: None (Preliminary result)   Collection Time: 11/17/21  1:17 PM   Specimen: BLOOD RIGHT FOREARM   Result Value Ref Range Status   Specimen Description BLOOD RIGHT FOREARM  Final   Special Requests   Final    BOTTLES DRAWN AEROBIC AND ANAEROBIC Blood Culture results may not be optimal due to an inadequate volume of blood received in culture bottles   Culture   Final    NO GROWTH 3 DAYS Performed at [facility], [address], [place],  [address]    Report Status PENDING  Incomplete    Please note: You were cared for by a hospitalist during your hospital stay. Once you are discharged, your primary care physician will handle any further medical issues. Please note that NO REFILLS for any discharge medications will be authorized once you are discharged, as it is imperative that you return to your primary care physician (or  establish a relationship with a primary care physician if you do not have one) for your post hospital discharge needs so that they can reassess your need for medications and monitor your lab values.    Time coordinating discharge: 40 minutes  SIGNED:   Shelly Coss, MD  [facility] 11/20/2021, 2:53 PM Pager [phone]  If 7PM-7AM, please contact night-coverage [email or web address] Password [email or web address]

## 2021-11-20 NOTE — Progress Notes (Signed)
Date of Admission:  11/15/2021      ID: John Booker is a 71 y.o. male  Principal Problem:   Acute renal failure due to rhabdomyolysis [facility]) Active Problems:   Essential hypertension   Intractable hiccups   GERD without esophagitis   Hypokalemia   Hyponatremia   Bacteremia due to methicillin susceptible Staphylococcus aureus (MSSA)   Acute renal insufficiency   Non-traumatic rhabdomyolysis    Subjective: Pt says he is feeling better Pain lumbar spine better Had TEE today and it is negative MRI L/S spine done and it shows  Medications:   aspirin EC  81 mg Oral Daily   baclofen  10 mg Oral BID   butamben-tetracaine-benzocaine       cyanocobalamin  1,000 mcg Oral Daily   enoxaparin (LOVENOX) injection  0.5 mg/kg Subcutaneous Q24H   feeding supplement  237 mL Oral TID BM   fentaNYL       furosemide  40 mg Oral Daily   lidocaine       midazolam       midazolam       multivitamin with minerals  1 tablet Oral Daily   pantoprazole  40 mg Oral Daily   saccharomyces boulardii  250 mg Oral BID   sodium chloride flush       Vitamin D (Ergocalciferol)  50,000 Units Oral Q7 days    Objective: Vital signs in last 24 hours: Temp:  [97.9 F (36.6 C)-99.3 F (37.4 C)] 97.9 F (36.6 C) (09/13 0546) Pulse Rate:  [82-113] 85 (09/13 0845) Resp:  [16-27] 17 (09/13 0845) BP: (147-179)/(74-120) 147/74 (09/13 0845) SpO2:  [93 %-99 %] 94 % (09/13 0845)  LDA Foley Central lines Other catheters  PHYSICAL EXAM:  General: Alert, cooperative, no distress, appears stated age.  Lungs: Clear to auscultation bilaterally. No Wheezing or Rhonchi. No rales. Heart: Regular rate and rhythm, no murmur, rub or gallop. Abdomen: Soft, non-tender,not distended. Bowel sounds normal. No masses Extremities: atraumatic, no cyanosis. No edema. No clubbing Skin:bruising both upper extremities Small wounds- toes  Lymph: Cervical, supraclavicular normal. Neurologic: Grossly non-focal  Lab  Results Recent Labs    11/19/21 0508 11/20/21 0522  NA 134* 135  K 3.1* 3.1*  CL 97* 97*  CO2 29 31  BUN 14 12  CREATININE 0.85 0.82   Liver Panel No results for input(s): "PROT", "ALBUMIN", "AST", "ALT", "ALKPHOS", "BILITOT", "BILIDIR", "IBILI" in the last 72 hours. Sedimentation Rate Recent Labs    11/20/21 0522  ESRSEDRATE 60*    Microbiology: 9/923 MSSA bacteremia 1 set of 2 11/17/21 BC NG Studies/Results:  two small soft tissue fluid collections arising from the bilateral L5-S1 facet joints with surrounding soft tissue edema. The collection on the right measures 1.4 x 1.8 cm and the collection on the left measures 0.9 x 2.0 cm   MR LUMBAR SPINE WO CONTRAST  Result Date: 11/20/2021 CLINICAL DATA:  Low back pain infection suspected. EXAM: MRI LUMBAR SPINE WITHOUT CONTRAST TECHNIQUE: Multiplanar, multisequence MR imaging of the lumbar spine was performed. No intravenous contrast was administered. COMPARISON:  PET-CT 11/12/2021 FINDINGS: Segmentation: The last well-formed disc space is designated as L5-S1 with a small rudimentary disc space at S1-S2. Congenitally small AP dimension of the spinal canal. Alignment: Physiologic. Vertebrae: No evidence of vertebral body height loss or endplate erosion. There are scattered degenerative reactive marrow changes. Conus medullaris and cauda equina: Conus extends to the L1 level. Conus and cauda equina appear normal. Paraspinal and other soft  tissues: There is soft tissue edema within the paraspinal musculature the S1 vertebral body level with 2 small fluid collections adjacent to the L5-S1 facet joints, measuring up to 0 9 x 2.0 cm on the left (series 7, image 13) and 1.4 x 1.8 cm on the right (series 7, image 5-6). There is also surrounding soft tissue edema adjacent to these fluid collections. These collections are not visualized on PET-CT dated 11/12/2021 Disc levels: T12-L1: No significant spinal stenosis. L1-L2: Mild bilateral facet  degenerative change. No spinal canal stenosis. No foraminal stenosis. L2-L3: Mild bilateral facet degenerative change. No spinal canal or foraminal stenosis. L3-L4: Mild bilateral facet degenerative change. No spinal canal or foraminal stenosis. L4-L5: Mild epidural lipomatosis. Moderate to severe bilateral facet degenerative change. Ligamentum flavum hypertrophy. Mild bony spinal canal stenosis. Moderate narrowing of the thecal sac. L5-S1: Small disc bulge with an annular fissure. Ligamentum flavum hypertrophy. Epidural lipomatosis. Mild bony spinal canal narrowing. Moderate narrowing of the thecal sac. IMPRESSION: 1. There are two small soft tissue fluid collections arising from the bilateral L5-S1 facet joints with surrounding soft tissue edema. The collection on the right measures 1.4 x 1.8 cm and the collection on the left measures 0.9 x 2.0 cm. There is no evidence of marrow edema or osseous destruction at the adjacent facet joints. Findings could be degenerative or reactive in nature, but given patient's history of bacteremia and elevated inflammatory markers, early septic arthritis is difficult to exclude. If clinically indicated, this could be further assessed with a contrast enhanced lumbar spine MRI. 2. Lower lumbar spine predominant degenerative disc and facet disease without evidence of high grade bony spinal canal stenosis. Electronically Signed   By: Marin Roberts M.D.   On: 11/20/2021 09:15   ECHO TEE  Result Date: 11/20/2021    TRANSESOPHOGEAL ECHO REPORT   Patient Name:   John Booker Date of Exam: 11/20/2021 Medical Rec #:  [id number]      Height:       75.0 in Accession #:    [id number]     Weight:       220.9 lb Date of Birth:  1951-02-04      BSA:          2.289 m Patient Age:    6 years       BP:           179/83 mmHg Patient Gender: M              HR:           85 bpm. Exam Location:  [facility] Procedure: Transesophageal Echo, Cardiac Doppler, Color Doppler and Saline            Contrast  Bubble Study Indications:     Bacteremia R78.81  History:         Patient has prior history of Echocardiogram examinations, most                  recent 11/18/2021. Risk Factors:Hypertension.  Sonographer:     Sherrie Sport Referring Phys:  Alanson Puls TANG Diagnosing Phys: Serafina Royals MD PROCEDURE: The transesophogeal probe was passed without difficulty through the esophogus of the patient. Sedation performed by performing physician. The patient developed no complications during the procedure. IMPRESSIONS  1. Left ventricular ejection fraction, by estimation, is 60 to 65%. The left ventricle has normal function.  2. Right ventricular systolic function is normal. The right ventricular size is normal.  3. No left atrial/left atrial  appendage thrombus was detected.  4. The mitral valve is normal in structure. Trivial mitral valve regurgitation.  5. The aortic valve is normal in structure. Aortic valve regurgitation is not visualized.  6. Agitated saline contrast bubble study was negative, with no evidence of any interatrial shunt. FINDINGS  Left Ventricle: Left ventricular ejection fraction, by estimation, is 60 to 65%. The left ventricle has normal function. The left ventricular internal cavity size was small. Right Ventricle: The right ventricular size is normal. No increase in right ventricular wall thickness. Right ventricular systolic function is normal. Left Atrium: Left atrial size was normal in size. No left atrial/left atrial appendage thrombus was detected. Right Atrium: Right atrial size was normal in size. Pericardium: There is no evidence of pericardial effusion. Mitral Valve: The mitral valve is normal in structure. Trivial mitral valve regurgitation. There is no evidence of mitral valve vegetation. Tricuspid Valve: The tricuspid valve is normal in structure. Tricuspid valve regurgitation is trivial. There is no evidence of tricuspid valve vegetation. Aortic Valve: The aortic valve is normal in  structure. Aortic valve regurgitation is not visualized. There is no evidence of aortic valve vegetation. Pulmonic Valve: The pulmonic valve was normal in structure. Pulmonic valve regurgitation is trivial. There is no evidence of pulmonic valve vegetation. Aorta: The aortic root and ascending aorta are structurally normal, with no evidence of dilitation. There is minimal (Grade I) plaque. IAS/Shunts: No atrial level shunt detected by color flow Doppler. Agitated saline contrast was given intravenously to evaluate for intracardiac shunting. Agitated saline contrast bubble study was negative, with no evidence of any interatrial shunt. There  is no evidence of a patent foramen ovale. No ventricular septal defect is seen or detected. There is no evidence of an atrial septal defect. Serafina Royals MD Electronically signed by Serafina Royals MD Signature Date/Time: 11/20/2021/8:31:34 AM    Final      Assessment/Plan: 71 yr male presenting with fall and found on the floor for 8 hours   Fall - unclear cause ? Due to infection     MSSA bacteremia- very likely present on admission but blood culture not sent until 24 hrs. He has some small wounds on his toes but they dont look infected. 2 d echo done TEE neg for endocarditis MRI lumbar spine shows small collections B/l facet joint area l5-S1 Suggestive of septic arthritis esepcailly with bacteremia, pain and high esr Will treat like one with 6 weeks of IV cefazolin PT says he cannot stay at [facility] for more than 20 days- if that is the case he would have completed 4 weeks OF IV and then we can switch to oral antibiotic HE is agreeable to this plan   AKI on presentation- resolved    Acute traumatic rhabdomyolysis- reoslved   HTN   Chronic diastolic CHF   H/o hiccups and dysphagia- since 2019- esophageal dysmotility- followed by GI   Small lobulated masses paraspinal area on lefte T9-T10 unclear etiology ? Check IgG4 level   Cervical stenosis with  ossification of the pst longitudinal ligament   Cataract ( surgery was planned for this week) - cannot have it until infection cleared  Disucssed the management with patient and care team in detail Follow up as OP on 10/5

## 2021-11-20 NOTE — Consult Note (Signed)
[facility] Consultation Note  Patient ID: John Booker, MRN: [id number], DOB/AGE: October 27, 1950 71 y.o. Admit date: 11/15/2021   Date of Consult: 11/20/2021 Primary Physician: Dion Body, MD Primary Cardiologist: None  Chief Complaint:  Chief Complaint  Patient presents with   Fall   Reason for Consult:  Possible endocarditis  HPI: 71 y.o. male with known hypertension and borderline hyperlipidemia who has had new onset of significant sepsis and bacteremia methicillin sensitive Staphylococcus aureus.  The patient has had treatment of this with appropriate antibiotics and has had improvements of his symptoms.  The patient therefore has the potential for endocarditis.  Echocardiogram showed no evidence of significant valvular heart disease.  The patient has now consented for transesophageal echocardiogram and further evaluation for the possibility of endocarditis.  Transesophageal echocardiogram shows normal LV systolic function with ejection fraction of 55 to 60% and no evidence of vegetation on valves or any valvular heart disease.  There is no evidence of PFO or ASD.  The patient does have mild aortic atherosclerosis  Past Medical History:  Diagnosis Date   Hypertension       Surgical History:  Past Surgical History:  Procedure Laterality Date   ESOPHAGOGASTRODUODENOSCOPY (EGD) WITH PROPOFOL N/A 05/26/2018   Procedure: ESOPHAGOGASTRODUODENOSCOPY (EGD) WITH PROPOFOL;  Surgeon: Virgel Manifold, MD;  Location: [facility] ENDOSCOPY;  Service: Endoscopy;  Laterality: N/A;   ESOPHAGOGASTRODUODENOSCOPY (EGD) WITH PROPOFOL N/A 12/28/2020   Procedure: ESOPHAGOGASTRODUODENOSCOPY (EGD) WITH PROPOFOL;  Surgeon: Annamaria Helling, DO;  Location: [facility] ENDOSCOPY;  Service: Gastroenterology;  Laterality: N/A;   ESOPHAGOGASTRODUODENOSCOPY (EGD) WITH PROPOFOL N/A 10/09/2021   Procedure: ESOPHAGOGASTRODUODENOSCOPY (EGD) WITH PROPOFOL;  Surgeon: [name], MD;  Location: [facility]  ENDOSCOPY;  Service: Gastroenterology;  Laterality: N/A;   INCISION AND DRAINAGE PERIRECTAL ABSCESS N/A 11/19/2015   Procedure: IRRIGATION AND DEBRIDEMENT PERIRECTAL ABSCESS and debridement of scrotal abscess;  Surgeon: Florene Glen, MD;  Location: [facility] ORS;  Service: General;  Laterality: N/A;   INCISION AND DRAINAGE PERIRECTAL ABSCESS N/A 11/20/2015   Procedure: IRRIGATION AND DEBRIDEMENT PERIRECTAL ABSCESS / WITH DRESSING CHANGE;  Surgeon: Florene Glen, MD;  Location: [facility] ORS;  Service: General;  Laterality: N/A;  peri rectal site   INCISION AND DRAINAGE PERIRECTAL ABSCESS N/A 11/21/2015   Procedure: IRRIGATION AND DEBRIDEMENT PERIRECTAL ABSCESS WITH DRESSING CHANGE;  Surgeon: Florene Glen, MD;  Location: [facility] ORS;  Service: General;  Laterality: N/A;   MINOR HEMORRHOIDECTOMY  1989     Home Meds: Prior to Admission medications   Medication Sig Start Date End Date Taking? Authorizing Provider  acetaminophen (TYLENOL) 325 MG tablet Take 2 tablets (650 mg total) by mouth every 6 (six) hours as needed for mild pain or fever. 06/27/21  Yes Lorella Nimrod, MD  aspirin EC 81 MG EC tablet Take 1 tablet (81 mg total) by mouth daily. Swallow whole. 06/27/21  Yes Lorella Nimrod, MD  baclofen (LIORESAL) 10 MG tablet Take 1 tablet (10 mg total) by mouth 2 (two) times daily. 05/13/21  Yes Karamalegos, Devonne Doughty, DO  furosemide (LASIX) 40 MG tablet Take 40 mg by mouth daily. 11/07/21  Yes [provider]  losartan (COZAAR) 100 MG tablet Take 1 tablet (100 mg total) by mouth daily. 05/13/21  Yes Karamalegos, Devonne Doughty, DO  Multiple Vitamin (MULTIVITAMIN WITH MINERALS) TABS tablet Take 1 tablet by mouth daily. 12/31/20  Yes Nicole Kindred A, DO  omeprazole (PRILOSEC) 20 MG capsule Take 20 mg by mouth in the morning and at bedtime.  Yes [provider]  vitamin B-12 1000 MCG tablet Take 1 tablet (1,000 mcg total) by mouth daily. 12/31/20  Yes Nicole Kindred A, DO  feeding supplement  (ENSURE ENLIVE / ENSURE PLUS) LIQD Take 237 mLs by mouth 3 (three) times daily between meals. 12/31/20   Ezekiel Slocumb, DO  saccharomyces boulardii (FLORASTOR) 250 MG capsule Take 1 capsule (250 mg total) by mouth 2 (two) times daily. Patient not taking: Reported on 11/16/2021 12/31/20   Nicole Kindred A, DO  sildenafil (REVATIO) 20 MG tablet Take 1-5 pills about 30 min prior to sex. Start with 1 and increase as needed. 12/20/19   Karamalegos, Devonne Doughty, DO  Vitamin D, Ergocalciferol, (DRISDOL) 1.25 MG (50000 UNIT) CAPS capsule Take 1 capsule (50,000 Units total) by mouth every 7 (seven) days. Patient not taking: Reported on 11/16/2021 01/03/21   Ezekiel Slocumb, DO    Inpatient Medications:   aspirin EC  81 mg Oral Daily   baclofen  10 mg Oral BID   butamben-tetracaine-benzocaine       cyanocobalamin  1,000 mcg Oral Daily   enoxaparin (LOVENOX) injection  0.5 mg/kg Subcutaneous Q24H   feeding supplement  237 mL Oral TID BM   fentaNYL       furosemide  40 mg Oral Daily   lidocaine       midazolam       midazolam       multivitamin with minerals  1 tablet Oral Daily   pantoprazole  40 mg Oral Daily   saccharomyces boulardii  250 mg Oral BID   sodium chloride flush       Vitamin D (Ergocalciferol)  50,000 Units Oral Q7 days    sodium chloride 20 mL/hr at 11/19/21 2222    ceFAZolin (ANCEF) IV 2 g (11/20/21 0550)   chlorproMAZINE (THORAZINE) 12.5 mg in sodium chloride 0.9 % 25 mL IVPB Stopped (11/16/21 1720)   potassium chloride     sodium chloride      Allergies:  Allergies  Allergen Reactions   Adalat [Nifedipine]     Foot swelling.    Amlodipine Other (See Comments)    Leg swelling    Social History   Socioeconomic History   Marital status: Legally Separated    Spouse name: Not on file   Number of children: Not on file   Years of education: Not on file   Highest education level: Not on file  Occupational History   Not on file  Tobacco Use   Smoking status:  Former    Packs/day: 0.50    Years: 38.00    Total pack years: 19.00    Types: Cigarettes   Smokeless tobacco: Former  Scientific laboratory technician Use: Never used  Substance and Sexual Activity   Alcohol use: Not Currently    Comment: several months,[place]   Drug use: No   Sexual activity: Not Currently  Other Topics Concern   Not on file  Social History Narrative   Not on file   Social Determinants of Health   Financial Resource Strain: Not on file  Food Insecurity: Not on file  Transportation Needs: Not on file  Physical Activity: Not on file  Stress: Not on file  Social Connections: Not on file  Intimate Partner Violence: Not on file     Family History  Problem Relation Age of Onset   Hypertension Father      Review of Systems Positive for cough shortness of breath weakness Negative for:  General:  chills, fever, night sweats or weight changes.  Cardiovascular: PND orthopnea syncope dizziness  Dermatological skin lesions rashes Respiratory: Positive for cough congestion Urologic: Frequent urination urination at night and hematuria Abdominal: negative for nausea, vomiting, diarrhea, bright red blood per rectum, melena, or hematemesis Neurologic: negative for visual changes, and/or hearing changes  All other systems reviewed and are otherwise negative except as noted above.  Labs: Recent Labs    11/18/21 0453 11/19/21 0508 11/20/21 0522  CKTOTAL 1,593* 772* 348   Lab Results  Component Value Date   WBC 8.6 11/17/2021   HGB 14.4 11/17/2021   HCT 42.3 11/17/2021   MCV 95.5 11/17/2021   PLT 147 (L) 11/17/2021    Recent Labs  Lab 11/15/21 1911 11/16/21 0535 11/20/21 0522  NA 129*   < > 135  K 2.9*   < > 3.1*  CL 86*   < > 97*  CO2 26   < > 31  BUN 29*   < > 12  CREATININE 2.23*   < > 0.82  CALCIUM 8.6*   < > 8.5*  PROT 6.7  --   --   BILITOT 2.3*  --   --   ALKPHOS 65  --   --   ALT 27  --   --   AST 98*  --   --   GLUCOSE 146*   < > 95   < > =  values in this interval not displayed.   Lab Results  Component Value Date   CHOL 101 06/27/2021   HDL 51 06/27/2021   LDLCALC 44 06/27/2021   TRIG 28 06/27/2021   Lab Results  Component Value Date   DDIMER 0.66 (H) 06/26/2021    Radiology/Studies:  ECHOCARDIOGRAM COMPLETE  Result Date: 11/18/2021    ECHOCARDIOGRAM REPORT   Patient Name:   John Booker Date of Exam: 11/18/2021 Medical Rec #:  [id number]      Height:       75.0 in Accession #:    [id number]     Weight:       220.9 lb Date of Birth:  [birth year]      BSA:          2.289 m Patient Age:    32 years       BP:           164/79 mmHg Patient Gender: M              HR:           94 bpm. Exam Location:  [facility] Procedure: Transesophageal Echo, Color Doppler and Cardiac Doppler Indications:     Bacteremia R78.81  History:         Patient has prior history of Echocardiogram examinations, most                  recent 04/11/2021. Risk Factors:Hypertension.  Sonographer:     Sherrie Sport Referring Phys:  [id number] [facility] AMIN Diagnosing Phys: Kathlyn Sacramento MD  Sonographer Comments: Technically challenging study due to limited acoustic windows, suboptimal apical window and no subcostal window. IMPRESSIONS  1. Left ventricular ejection fraction, by estimation, is 60 to 65%. The left ventricle has normal function. The left ventricle has no regional wall motion abnormalities. There is mild left ventricular hypertrophy. Left ventricular diastolic parameters are consistent with Grade I diastolic dysfunction (impaired relaxation).  2. Right ventricular systolic function is normal. The right ventricular size is normal. Tricuspid regurgitation signal is  inadequate for assessing PA pressure.  3. The mitral valve is normal in structure. No evidence of mitral valve regurgitation. No evidence of mitral stenosis.  4. The aortic valve is normal in structure. Aortic valve regurgitation is not visualized. No aortic stenosis is present. Conclusion(s)/Recommendation(s): No  evidence of valvular vegetations on this transthoracic echocardiogram. Suboptimal images. FINDINGS  Left Ventricle: Left ventricular ejection fraction, by estimation, is 60 to 65%. The left ventricle has normal function. The left ventricle has no regional wall motion abnormalities. The left ventricular internal cavity size was normal in size. There is  mild left ventricular hypertrophy. Left ventricular diastolic parameters are consistent with Grade I diastolic dysfunction (impaired relaxation). Right Ventricle: The right ventricular size is normal. No increase in right ventricular wall thickness. Right ventricular systolic function is normal. Tricuspid regurgitation signal is inadequate for assessing PA pressure. Left Atrium: Left atrial size was normal in size. Right Atrium: Right atrial size was normal in size. Pericardium: Trivial pericardial effusion is present. Mitral Valve: The mitral valve is normal in structure. No evidence of mitral valve regurgitation. No evidence of mitral valve stenosis. Tricuspid Valve: The tricuspid valve is normal in structure. Tricuspid valve regurgitation is not demonstrated. No evidence of tricuspid stenosis. Aortic Valve: The aortic valve is normal in structure. Aortic valve regurgitation is not visualized. No aortic stenosis is present. Aortic valve mean gradient measures 2.0 mmHg. Aortic valve peak gradient measures 4.0 mmHg. Aortic valve area, by VTI measures 3.07 cm. Pulmonic Valve: The pulmonic valve was normal in structure. Pulmonic valve regurgitation is not visualized. No evidence of pulmonic stenosis. Aorta: The aortic root is normal in size and structure. Venous: The inferior vena cava was not well visualized. IAS/Shunts: No atrial level shunt detected by color flow Doppler.  LEFT VENTRICLE PLAX 2D LVIDd:         3.60 cm   Diastology LVIDs:         2.30 cm   LV e' medial:    4.90 cm/s LV PW:         1.20 cm   LV E/e' medial:  17.2 LV IVS:        1.20 cm   LV e'  lateral:   9.79 cm/s LVOT diam:     2.00 cm   LV E/e' lateral: 8.6 LV SV:         55 LV SV Index:   24 LVOT Area:     3.14 cm  RIGHT VENTRICLE RV Basal diam:  3.50 cm RV S prime:     14.60 cm/s TAPSE (M-mode): 2.9 cm LEFT ATRIUM           Index        RIGHT ATRIUM           Index LA diam:      3.10 cm 1.35 cm/m   RA Area:     14.10 cm LA Vol (A4C): 39.6 ml 17.30 ml/m  RA Volume:   31.10 ml  13.59 ml/m  AORTIC VALVE AV Area (Vmax):    2.46 cm AV Area (Vmean):   2.44 cm AV Area (VTI):     3.07 cm AV Vmax:           100.00 cm/s AV Vmean:          68.200 cm/s AV VTI:            0.180 m AV Peak Grad:      4.0 mmHg AV Mean Grad:  2.0 mmHg LVOT Vmax:         78.30 cm/s LVOT Vmean:        52.900 cm/s LVOT VTI:          0.176 m LVOT/AV VTI ratio: 0.98  AORTA Ao Root diam: 3.60 cm MITRAL VALVE                TRICUSPID VALVE MV Area (PHT): 4.39 cm     TR Peak grad:   8.8 mmHg MV Decel Time: 173 msec     TR Vmax:        148.00 cm/s MV E velocity: 84.40 cm/s MV A velocity: 129.00 cm/s  SHUNTS MV E/A ratio:  0.65         Systemic VTI:  0.18 m                             Systemic Diam: 2.00 cm Kathlyn Sacramento MD Electronically signed by Kathlyn Sacramento MD Signature Date/Time: 11/18/2021/1:52:49 PM    Final    CT Cervical Spine Wo Contrast  Result Date: 11/15/2021 CLINICAL DATA:  Fall at home.  Neck trauma. EXAM: CT CERVICAL SPINE WITHOUT CONTRAST TECHNIQUE: Multidetector CT imaging of the cervical spine was performed without intravenous contrast. Multiplanar CT image reconstructions were also generated. RADIATION DOSE REDUCTION: This exam was performed according to the departmental dose-optimization program which includes automated exposure control, adjustment of the mA and/or kV according to patient size and/or use of iterative reconstruction technique. COMPARISON:  MRI of the cervical spine 12/29/2020 FINDINGS: Alignment: No significant listhesis is present. Straightening of the normal cervical lordosis is again  noted. Skull base and vertebrae: Craniocervical junction is within normal limits. Vertebral body heights are maintained. No acute fractures are present. Soft tissues and spinal canal: No prevertebral fluid or swelling. No visible canal hematoma. 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 Moderate left foraminal stenosis at C3-4 and right foraminal stenosis at C4-5 is stable. Moderate foraminal narrowing bilaterally at C5-6 is stable. Upper chest: Lung apices are clear. The thoracic inlet is within normal limits. IMPRESSION: 1. No acute fracture or traumatic subluxation. 2. 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 3. Multilevel foraminal stenosis as described. Electronically Signed   By: San Morelle M.D.   On: 11/15/2021 18:43   DG Chest Portable 1 View  Result Date: 11/15/2021 CLINICAL DATA:  Fall in bathroom.  Weakness. EXAM: PORTABLE CHEST 1 VIEW COMPARISON:  06/27/2011 FINDINGS: The heart size and mediastinal contours are within normal limits. Both lungs are clear. No evidence of pneumothorax or hemothorax. The visualized skeletal structures are unremarkable. IMPRESSION: No active disease. Electronically Signed   By: Marlaine Hind M.D.   On: 11/15/2021 18:40   CT HEAD WO CONTRAST (5MM)  Result Date: 11/15/2021 CLINICAL DATA:  Fall at home while going to the bathroom. No loss of consciousness. Patient denies hitting head. EXAM: CT HEAD WITHOUT CONTRAST TECHNIQUE: Contiguous axial images were obtained from the base of the skull through the vertex without intravenous contrast. RADIATION DOSE REDUCTION: This exam was performed according to the departmental dose-optimization program which includes automated exposure control, adjustment of the mA and/or kV according to patient size and/or use of iterative  reconstruction technique. COMPARISON:  CT head without contrast 06/26/2021 MR head  without contrast 12/29/2020 FINDINGS: Brain: No acute infarct, hemorrhage, or mass lesion is present. Mild atrophy and white matter changes are stable, likely within normal limits for age. The ventricles are of normal size. No significant extraaxial fluid collection is present. The brainstem and cerebellum are within normal limits. Vascular: No hyperdense vessel or unexpected calcification. Skull: No significant extracranial soft tissue lesion is present. Calvarium is intact. No focal lytic or blastic lesions are present. Cerumen is present in the external auditory canals bilaterally. Sinuses/Orbits: The paranasal sinuses and mastoid air cells are clear. The globes and orbits are within normal limits. IMPRESSION: 1. No acute intracranial abnormality or significant interval change. 2. Stable atrophy and white matter disease, likely within normal limits for age. Electronically Signed   By: San Morelle M.D.   On: 11/15/2021 18:40   NM PET Image Initial (PI) Skull Base To Thigh  Result Date: 11/13/2021 CLINICAL DATA:  Initial treatment strategy for mediastinal mass and possible esophageal lesion. EXAM: NUCLEAR MEDICINE PET SKULL BASE TO THIGH TECHNIQUE: 12.6 mCi F-18 FDG was injected intravenously. Full-ring PET imaging was performed from the skull base to thigh after the radiotracer. CT data was obtained and used for attenuation correction and anatomic localization. Fasting blood glucose: 104 mg/dl COMPARISON:  CT on 10/01/2021 FINDINGS: Mediastinal blood-pool activity (background): SUV max = 1.8 Liver activity (reference): SUV max = N/A NECK:  No hypermetabolic lymph nodes or masses. Incidental CT findings:  None. CHEST: No hypermetabolic lymph nodes or other masses. No suspicious pulmonary nodules seen on CT images. Mixed fat and soft tissue attenuation masses are again seen in the left thoracic paraspinal regions, but show  absence of hypermetabolic activity suggesting benign lesions such as extramedullary hematopoiesis. No evidence of hypermetabolism involving the esophagus. Incidental CT findings:  None. ABDOMEN/PELVIS: No abnormal hypermetabolic activity within the liver, pancreas, adrenal glands, or spleen. No hypermetabolic lymph nodes in the abdomen or pelvis. Incidental CT findings: Stable mildly enlarged prostate gland. Diverticulosis is seen mainly involving the sigmoid colon, however there is no evidence of diverticulitis. Aortic atherosclerotic calcification incidentally noted. SKELETON: No focal hypermetabolic bone lesions to suggest skeletal metastasis. Incidental CT findings:  None. IMPRESSION: No evidence of malignancy. Left thoracic paraspinal mixed fat and soft tissue attenuation masses show absence of hypermetabolism, consistent with benign etiology such as extramedullary hematopoiesis. Suggest continued follow-up by chest CT in 6 months. Electronically Signed   By: Marlaine Hind M.D.   On: 11/13/2021 13:33    EKG: Normal sinus rhythm with poor R wave progression  Weights: Filed Weights   11/15/21 1751 11/16/21 2200  Weight: 100.2 kg 100.2 kg     Physical Exam: Blood pressure (!) 164/90, pulse (!) 113, temperature 97.9 F (36.6 C), temperature source Oral, resp. rate 17, height '6\' 3"'$  (1.905 m), weight 100.2 kg, SpO2 93 %. Body mass index is 27.61 kg/m. General: Well developed, well nourished, in no acute distress. Head eyes ears nose throat: Normocephalic, atraumatic, sclera non-icteric, no xanthomas, nares are without discharge. No apparent thyromegaly and/or mass  Lungs: Normal respiratory effort.  no wheezes, no rales, diffuse rhonchi.  Heart: RRR with normal S1 S2. no murmur gallop, no rub, PMI is normal size and placement, carotid upstroke normal without bruit, jugular venous pressure is normal Abdomen: Soft, non-tender, non-distended with normoactive bowel sounds. No hepatomegaly. No  rebound/guarding. No obvious abdominal masses. Abdominal aorta is normal size without bruit Extremities: No edema. no cyanosis, no clubbing, no ulcers  Peripheral : 2+ bilateral upper extremity  pulses, 2+ bilateral femoral pulses, 2+ bilateral dorsal pedal pulse Neuro: Alert and oriented. No facial asymmetry. No focal deficit. Moves all extremities spontaneously. Musculoskeletal: Normal muscle tone without kyphosis Psych:  Responds to questions appropriately with a normal affect.    Assessment: 71 year old male with weakness fatigue and methicillin sensitive Staphylococcus aureus bacteremia with no current evidence of endocarditis by transesophageal echocardiogram and no current evidence of congestive heart failure or myocardial infarction  Plan: 1.  No further cardiac intervention at this time due to no evidence of endocarditis or LV dysfunction or congestive heart failure 2.  Continue medication management and further investigation of source bacteremia 3.  Further treatment options after above  Signed, Corey Skains M.D. [facility] 11/20/2021, 8:23 AM

## 2021-11-20 NOTE — Progress Notes (Signed)
PHARMACY CONSULT NOTE FOR:  OUTPATIENT  PARENTERAL ANTIBIOTIC THERAPY (OPAT)  Indication: MSSA bacteremia and possible paraspinal abscess Regimen: cefazolin 2gm IV q8h End date: 12/28/2021  Labs - Once weekly:  CBC/D, CMP, ESR and CRP  Please pull PIC at completion of IV antibiotics  Fax weekly lab results  promptly to [phone]   IV antibiotic discharge orders are pended. To discharging provider:  please sign these orders via discharge navigator,  Select New Orders & click on the button choice - Manage This Unsigned Work.     Thank you for allowing pharmacy to be a part of this patient's care.  Doreene Eland, PharmD, BCPS, BCIDP Work Cell: [phone] 11/20/2021 2:22 PM

## 2021-11-20 NOTE — TOC Transition Note (Addendum)
Transition of Care [facility]) - CM/SW Discharge Note   Patient Details  Name: John Booker MRN: [id number] Date of Birth: 05/14/1950  [facility]) CM/SW Contact:  Beverly Sessions, RN Phone Number: 11/20/2021, 4:04 PM   Clinical Narrative:      Patient will DC to: [facility] Anticipated DC date: 11/20/21  Family notified:VM left for daughter Transport by: daughter   Per MD patient ready for DC to . RN, patient, patient's family, and facility notified of DC. Discharge Summary sent to facility. RN given number for report.  TOC signing off.  Isaias Cowman [facility] [phone]  Update:  daughter states she will be here  a little before 6pm to pick up patient and transport to [facility].  [facility] updated.  Medical team updated        Patient Goals and CMS Choice        Discharge Placement                       Discharge Plan and Services                                     Social Determinants of Health (SDOH) Interventions     Readmission Risk Interventions     No data to display

## 2021-11-20 NOTE — TOC Progression Note (Addendum)
Transition of Care [facility]) - Progression Note    Patient Details  Name: John Booker MRN: [id number] Date of Birth: 04/26/1950  Transition [facility]) CM/SW Contact  Beverly Sessions, RN Phone Number: 11/20/2021, 3:15 PM  Clinical Narrative:      Josem Kaufmann received by Chinita Greenland [facility] confirms that they can accept patient today Plan now for PICC line and IV antibiotics at discharge.  [facility] aware and confirms they can still accept.  Daughter aware that she will need to transport at discharge  DC information sent in [place]     Per MD patient still potentially to DC today after picc line placed.  Per [facility] daughter can bring him up until 7pm.  VM left for daughter to update   Expected Discharge Plan and Services           Expected Discharge Date: 11/20/21                                     Social Determinants of Health (SDOH) Interventions    Readmission Risk Interventions     No data to display

## 2021-11-21 ENCOUNTER — Telehealth: Payer: Self-pay

## 2021-11-21 ENCOUNTER — Inpatient Hospital Stay: Payer: Medicare Other | Admitting: Oncology

## 2021-11-21 ENCOUNTER — Encounter: Payer: Self-pay | Admitting: Internal Medicine

## 2021-11-21 DIAGNOSIS — I10 Essential (primary) hypertension: Secondary | ICD-10-CM | POA: Diagnosis not present

## 2021-11-21 DIAGNOSIS — R222 Localized swelling, mass and lump, trunk: Secondary | ICD-10-CM

## 2021-11-21 DIAGNOSIS — M6281 Muscle weakness (generalized): Secondary | ICD-10-CM | POA: Diagnosis not present

## 2021-11-21 DIAGNOSIS — K219 Gastro-esophageal reflux disease without esophagitis: Secondary | ICD-10-CM | POA: Diagnosis not present

## 2021-11-21 DIAGNOSIS — G062 Extradural and subdural abscess, unspecified: Secondary | ICD-10-CM | POA: Diagnosis not present

## 2021-11-21 LAB — CULTURE, BLOOD (ROUTINE X 2)
Culture: NO GROWTH
Special Requests: ADEQUATE

## 2021-11-21 NOTE — Telephone Encounter (Signed)
Spoke with patient's daughter to go over chart for video visit, Orson Slick states she called on 9/11 to cancel due to patient currently in Rehabilitation. Daughter will call back to r/s video visit for when patient is feeling better. Anderson Malta, please cancel today's appt

## 2021-11-22 LAB — CULTURE, BLOOD (ROUTINE X 2)
Culture: NO GROWTH
Culture: NO GROWTH
Special Requests: ADEQUATE

## 2021-11-25 DIAGNOSIS — I2510 Atherosclerotic heart disease of native coronary artery without angina pectoris: Secondary | ICD-10-CM | POA: Diagnosis not present

## 2021-11-25 DIAGNOSIS — Z79899 Other long term (current) drug therapy: Secondary | ICD-10-CM | POA: Diagnosis not present

## 2021-11-26 DIAGNOSIS — G062 Extradural and subdural abscess, unspecified: Secondary | ICD-10-CM | POA: Diagnosis not present

## 2021-11-26 DIAGNOSIS — R066 Hiccough: Secondary | ICD-10-CM | POA: Diagnosis not present

## 2021-11-26 DIAGNOSIS — I10 Essential (primary) hypertension: Secondary | ICD-10-CM | POA: Diagnosis not present

## 2021-11-26 NOTE — Telephone Encounter (Signed)
error 

## 2021-11-27 ENCOUNTER — Telehealth: Payer: Self-pay

## 2021-11-27 NOTE — Telephone Encounter (Signed)
Error in creation of entry.  Jeral Fruit, RN 11/27/21 10:15 AM

## 2021-11-28 ENCOUNTER — Telehealth: Payer: Self-pay

## 2021-11-28 DIAGNOSIS — R066 Hiccough: Secondary | ICD-10-CM | POA: Diagnosis not present

## 2021-11-28 DIAGNOSIS — G062 Extradural and subdural abscess, unspecified: Secondary | ICD-10-CM | POA: Diagnosis not present

## 2021-11-28 DIAGNOSIS — I10 Essential (primary) hypertension: Secondary | ICD-10-CM | POA: Diagnosis not present

## 2021-11-28 NOTE — Telephone Encounter (Signed)
Faith NP with [facility] [phone]) called stating the patient is getting very agitated and requesting to go home. Patient does not have anyone at home to assist with infusing IV antibiotics.  I have also spoke to patient's daughter Orson Slick) and she stated she is not comfortable to the IV antibiotics.  Per Dr. Delaine Lame patient will need to remain at the facility until completion of IV antibiotics and the only other option is to end IV antibiotics at 4 weeks (12/09/21) if patient completely refuses to stay the full 6 weeks.  Faith NP with discuss treatment plan with the patient and follow back up with out office. Gad Aymond T Brooks Sailors

## 2021-12-02 DIAGNOSIS — I10 Essential (primary) hypertension: Secondary | ICD-10-CM | POA: Diagnosis not present

## 2021-12-02 NOTE — Telephone Encounter (Signed)
Received call from [place] at [facility] requesting clarification of patient's stop date for IV cefazolin. Confirmed that the current order states duration of 6 weeks with end of treatment on 12/28/21; noted that there had been some discussion of the treatment plan last week. John Booker stated she was aware, and verbalized understanding of current order.  Binnie Kand, RN

## 2021-12-03 DIAGNOSIS — I10 Essential (primary) hypertension: Secondary | ICD-10-CM | POA: Diagnosis not present

## 2021-12-03 DIAGNOSIS — G062 Extradural and subdural abscess, unspecified: Secondary | ICD-10-CM | POA: Diagnosis not present

## 2021-12-03 DIAGNOSIS — R066 Hiccough: Secondary | ICD-10-CM | POA: Diagnosis not present

## 2021-12-05 ENCOUNTER — Other Ambulatory Visit: Payer: Self-pay | Admitting: *Deleted

## 2021-12-05 DIAGNOSIS — B351 Tinea unguium: Secondary | ICD-10-CM | POA: Diagnosis not present

## 2021-12-05 DIAGNOSIS — M79675 Pain in left toe(s): Secondary | ICD-10-CM | POA: Diagnosis not present

## 2021-12-05 DIAGNOSIS — M79674 Pain in right toe(s): Secondary | ICD-10-CM | POA: Diagnosis not present

## 2021-12-05 DIAGNOSIS — L97511 Non-pressure chronic ulcer of other part of right foot limited to breakdown of skin: Secondary | ICD-10-CM | POA: Diagnosis not present

## 2021-12-05 NOTE — Patient Outreach (Addendum)
Mr. Lamantia resides in [facility]. Screening for potential [facility] care coordination services as benefit of insurance plan and PCP.   Secure communication sent to SNF SW to inquire about transition plans and for potential [facility] needs.    Addendum: Update from [place], [place] worker reporting Mr. Sohn is receiving iv antibiotics. Plan is to return home upon completion.  Will continue to follow.   Marthenia Rolling, MSN, RN,BSN [place] Acute Care Coordinator [phone] (Direct dial)

## 2021-12-09 ENCOUNTER — Telehealth: Payer: Self-pay

## 2021-12-09 DIAGNOSIS — K219 Gastro-esophageal reflux disease without esophagitis: Secondary | ICD-10-CM | POA: Diagnosis not present

## 2021-12-09 DIAGNOSIS — I10 Essential (primary) hypertension: Secondary | ICD-10-CM | POA: Diagnosis not present

## 2021-12-09 DIAGNOSIS — G062 Extradural and subdural abscess, unspecified: Secondary | ICD-10-CM | POA: Diagnosis not present

## 2021-12-09 DIAGNOSIS — M6281 Muscle weakness (generalized): Secondary | ICD-10-CM | POA: Diagnosis not present

## 2021-12-09 MED ORDER — CEPHALEXIN 500 MG PO CAPS: 1000.0000 mg | ORAL_CAPSULE | Freq: Four times a day (QID) | ORAL | 0 refills | Status: AC

## 2021-12-09 NOTE — Telephone Encounter (Addendum)
[name], NP with [facility] stating patient is being discharged from SNF tomorrow due to insurance. Patient's insurance is no longer covering patient's stay at the facility. [name] wanted to know patient's new treatment plan since they will be discharging him home tomorrow. Patient will be starting Cephalexin 1 gram every 6 hours per Dr. Delaine Lame. RX sent to [facility] in [place] per [employer] request. John Booker

## 2021-12-11 ENCOUNTER — Other Ambulatory Visit: Payer: Self-pay | Admitting: *Deleted

## 2021-12-11 DIAGNOSIS — I10 Essential (primary) hypertension: Secondary | ICD-10-CM

## 2021-12-11 NOTE — Patient Outreach (Signed)
Verified in [facility] Mr. John Booker discharged from [facility] on 12/10/21.   Will follow up with SNF SW to inquire about home health arrangements.   Will make referral to [facility] care coordination team.  Mr. John Booker has medical history of HTN, GERD, bacteremia.   Marthenia Rolling, MSN, RN,BSN [place] Acute Care Coordinator [phone] (Direct dial)

## 2021-12-12 ENCOUNTER — Ambulatory Visit: Payer: Medicare Other | Attending: Infectious Diseases | Admitting: Infectious Diseases

## 2021-12-12 ENCOUNTER — Encounter: Payer: Self-pay | Admitting: Infectious Diseases

## 2021-12-12 ENCOUNTER — Telehealth: Payer: Self-pay

## 2021-12-12 VITALS — BP 157/84 | HR 86 | Temp 97.1°F | Ht 75.0 in | Wt 230.0 lb

## 2021-12-12 DIAGNOSIS — K224 Dyskinesia of esophagus: Secondary | ICD-10-CM | POA: Insufficient documentation

## 2021-12-12 DIAGNOSIS — R7881 Bacteremia: Secondary | ICD-10-CM | POA: Insufficient documentation

## 2021-12-12 DIAGNOSIS — B9561 Methicillin susceptible Staphylococcus aureus infection as the cause of diseases classified elsewhere: Secondary | ICD-10-CM | POA: Diagnosis not present

## 2021-12-12 DIAGNOSIS — N400 Enlarged prostate without lower urinary tract symptoms: Secondary | ICD-10-CM | POA: Diagnosis not present

## 2021-12-12 DIAGNOSIS — I110 Hypertensive heart disease with heart failure: Secondary | ICD-10-CM | POA: Diagnosis not present

## 2021-12-12 DIAGNOSIS — M549 Dorsalgia, unspecified: Secondary | ICD-10-CM | POA: Insufficient documentation

## 2021-12-12 DIAGNOSIS — I5032 Chronic diastolic (congestive) heart failure: Secondary | ICD-10-CM | POA: Diagnosis not present

## 2021-12-12 NOTE — Patient Instructions (Addendum)
You are here for follow up of the staph aureus infection- you are on kefelx 1 gram PO every 6 hrs after getting 3 weeks of Iv antibiotic- you will complete the medicine on 12/28/21. Today will do labs- ESR/CRP/CMP/CBC Follow up as needed

## 2021-12-12 NOTE — Chronic Care Management (AMB) (Signed)
  Care Coordination  Outreach Note  12/12/2021 Name: John Booker MRN: [id number] DOB: [birth year]   Care Coordination Outreach Attempts: An unsuccessful telephone outreach was attempted today to offer the patient information about available care coordination services as a benefit of their health plan.   Follow Up Plan:  Additional outreach attempts will be made to offer the patient care coordination information and services.   Encounter Outcome:  No Answer  Sig Noreene Larsson, [place], [place] [address] Direct Dial: [phone] [email or web address]

## 2021-12-12 NOTE — Progress Notes (Signed)
NAME: John Booker  DOB: 02/20/1951  MRN: [id number]  Date/Time: 12/12/2021 11:18 AM   Subjective:  Is here with his daughter  ? John Booker is a 71 y.o. with a history of BPH, HTN was in [facility] after a fall and found to have rhabdomyolysis, MSSA bacteremia, . He had TEE which was neg- As he had back pain, MRI of the lumbar spine was done and it showed small collections B/l facet joint area L5-S1 Suggestive of septic arthritis esepcailly with bacteremia, pain and high esr. HE was discharged to SNF on 6 weeks of IV cefazolin to finish on 12/28/21- He left SNF on 12/10/21 as his insurance would not cover beyond that epriod- He did not want to fdo tIV at home and hsi daughter could not help him everyday , so at his request the Iv was discontinued and he was switched to keflex 1 gram Po every 6 hours for 4 more weeks He is doing fine he says- not too cooperative to answer questions- gets irritable with examination as well   Past Medical History:  Diagnosis Date   Hypertension     Past Surgical History:  Procedure Laterality Date   ESOPHAGOGASTRODUODENOSCOPY (EGD) WITH PROPOFOL N/A 05/26/2018   Procedure: ESOPHAGOGASTRODUODENOSCOPY (EGD) WITH PROPOFOL;  Surgeon: Virgel Manifold, MD;  Location: [facility] ENDOSCOPY;  Service: Endoscopy;  Laterality: N/A;   ESOPHAGOGASTRODUODENOSCOPY (EGD) WITH PROPOFOL N/A 12/28/2020   Procedure: ESOPHAGOGASTRODUODENOSCOPY (EGD) WITH PROPOFOL;  Surgeon: Annamaria Helling, DO;  Location: [facility] ENDOSCOPY;  Service: Gastroenterology;  Laterality: N/A;   ESOPHAGOGASTRODUODENOSCOPY (EGD) WITH PROPOFOL N/A 10/09/2021   Procedure: ESOPHAGOGASTRODUODENOSCOPY (EGD) WITH PROPOFOL;  Surgeon: [name], MD;  Location: [facility] ENDOSCOPY;  Service: Gastroenterology;  Laterality: N/A;   INCISION AND DRAINAGE PERIRECTAL ABSCESS N/A 11/19/2015   Procedure: IRRIGATION AND DEBRIDEMENT PERIRECTAL ABSCESS and debridement of scrotal abscess;  Surgeon: Florene Glen, MD;  Location:  [facility] ORS;  Service: General;  Laterality: N/A;   INCISION AND DRAINAGE PERIRECTAL ABSCESS N/A 11/20/2015   Procedure: IRRIGATION AND DEBRIDEMENT PERIRECTAL ABSCESS / WITH DRESSING CHANGE;  Surgeon: Florene Glen, MD;  Location: [facility] ORS;  Service: General;  Laterality: N/A;  peri rectal site   INCISION AND DRAINAGE PERIRECTAL ABSCESS N/A 11/21/2015   Procedure: IRRIGATION AND DEBRIDEMENT PERIRECTAL ABSCESS WITH DRESSING CHANGE;  Surgeon: Florene Glen, MD;  Location: [facility] ORS;  Service: General;  Laterality: N/A;   MINOR HEMORRHOIDECTOMY  1989   TEE WITHOUT CARDIOVERSION N/A 11/20/2021   Procedure: TRANSESOPHAGEAL ECHOCARDIOGRAM (TEE);  Surgeon: Corey Skains, MD;  Location: [facility] ORS;  Service: Cardiovascular;  Laterality: N/A;  8:00am    Social History   Socioeconomic History   Marital status: Legally Separated    Spouse name: Not on file   Number of children: Not on file   Years of education: Not on file   Highest education level: Not on file  Occupational History   Not on file  Tobacco Use   Smoking status: Former    Packs/day: 0.50    Years: 38.00    Total pack years: 19.00    Types: Cigarettes   Smokeless tobacco: Former  Scientific laboratory technician Use: Never used  Substance and Sexual Activity   Alcohol use: Not Currently    Comment: several months,[place]   Drug use: No   Sexual activity: Not Currently  Other Topics Concern   Not on file  Social History Narrative   Not on file   Social Determinants of Health  Financial Resource Strain: Not on file  Food Insecurity: Not on file  Transportation Needs: Not on file  Physical Activity: Not on file  Stress: Not on file  Social Connections: Not on file  Intimate Partner Violence: Not on file    Family History  Problem Relation Age of Onset   Hypertension Father    Allergies  Allergen Reactions   Adalat [Nifedipine]     Foot swelling.    Amlodipine Other (See Comments)    Leg swelling   I? Current  Outpatient Medications  Medication Sig Dispense Refill   acetaminophen (TYLENOL) 325 MG tablet Take 2 tablets (650 mg total) by mouth every 6 (six) hours as needed for mild pain or fever. 60 tablet 0   aspirin EC 81 MG EC tablet Take 1 tablet (81 mg total) by mouth daily. Swallow whole. 30 tablet 11   baclofen (LIORESAL) 10 MG tablet Take 1 tablet (10 mg total) by mouth 2 (two) times daily. 180 each 3   cephALEXin (KEFLEX) 500 MG capsule Take 2 capsules (1,000 mg total) by mouth in the morning, at noon, in the evening, and at bedtime. For 3 weeks 240 capsule 0   feeding supplement (ENSURE ENLIVE / ENSURE PLUS) LIQD Take 237 mLs by mouth 3 (three) times daily between meals. 237 mL 12   furosemide (LASIX) 40 MG tablet Take 40 mg by mouth daily.     losartan (COZAAR) 100 MG tablet Take 1 tablet (100 mg total) by mouth daily. 90 tablet 1   Multiple Vitamin (MULTIVITAMIN WITH MINERALS) TABS tablet Take 1 tablet by mouth daily.     omeprazole (PRILOSEC) 20 MG capsule Take 20 mg by mouth in the morning and at bedtime.     sildenafil (REVATIO) 20 MG tablet Take 1-5 pills about 30 min prior to sex. Start with 1 and increase as needed. 30 tablet 3   vitamin B-12 1000 MCG tablet Take 1 tablet (1,000 mcg total) by mouth daily. 30 tablet 1   potassium chloride SA (KLOR-CON M) 20 MEQ tablet Take 2 tablets (40 mEq total) by mouth daily for 3 days. 6 tablet 0   No current facility-administered medications for this visit.     Abtx:  Anti-infectives (From admission, onward)    None       REVIEW OF SYSTEMS:  Const: negative fever, negative chills,  Eyes: negative diplopia or visual changes, negative eye pain ENT: negative coryza, negative sore throat Resp: negative cough, hemoptysis, dyspnea Cards: negative for chest pain, palpitations, lower extremity edema GU: negative for frequency, dysuria and hematuria GI: chronic hiccups Skin: negative for rash and pruritus Heme: negative for easy bruising and  gum/nose bleeding MS: some weakness Edema legs Neurolo:negative for headaches, dizziness, vertigo, memory problems  Psych: negative for feelings of anxiety, depression  Endocrine: negative for thyroid, diabetes Allergy/Immunology- as above Objective:  VITALS:  BP (!) 157/84   Pulse 86   Temp (!) 97.1 F (36.2 C) (Temporal)   Ht 6' 3"  (1.905 m)   Wt 230 lb (104.3 kg)   BMI 28.75 kg/m   PHYSICAL EXAM:  General: Alert, , no distress, walks with a walker Head: Normocephalic, without obvious abnormality, atraumatic. Lungs: Clear to auscultation bilaterally. No Wheezing or Rhonchi. No rales. Heart: Regular rate and rhythm, no murmur, rub or gallop. Abdomen: did not examine Extremities:b/l edema legs  Skin:some scabs over toes Lymph: Cervical, supraclavicular normal. Neurologic: Grossly non-focal Pertinent Labs 12/03/21 Wbc 4.2 Hb 13.2 Esr 41 ( 61  on 9/13) Cr 0.78  ? Impression/Recommendation ? ?MSSA bacteremia- source likely was the toe wounds TEE neg for endocarditis MRI lumbar spine showed small collections B/l facet joint area l5-S1 suggestive of septic arthritis esepcailly with bacteremia, pain and high esr Pt completed 3 weeks of IV cefazolin and left SNF and did not want tot ake any more IV- so he is on PO Keflex 1 gram Q6 until 12/28/21. HE had labs on Monday before he left SNF so will defer labs today- will do that at the completion of antibiotics  Rt foot wounds have healed   AKI  resolved    Acute traumatic rhabdomyolysis- resolvedd   HTN   Chronic diastolic CHF   H/o hiccups and dysphagia- since 2019- esophageal dysmotility- followed by GI ? ___________________________________________________ Discussed with daughter and patient Follow PRN Note:  This document was prepared using Dragon voice recognition software and may include unintentional dictation errors.

## 2021-12-18 NOTE — Chronic Care Management (AMB) (Signed)
  Care Coordination   Note   12/18/2021 Name: [name] MRN: [id number] DOB: 09-10-1950  John Booker is a 71 y.o. year old male who sees Dion Body, MD for primary care. I reached out to John Booker by phone today to offer care coordination services.  Mr. Horn was given information about Care Coordination services today including:   The Care Coordination services include support from the care team which includes your Nurse Coordinator, Clinical Social Worker, or Pharmacist.  The Care Coordination team is here to help remove barriers to the health concerns and goals most important to you. Care Coordination services are voluntary, and the patient may decline or stop services at any time by request to their care team member.   Care Coordination Consent Status: Patient agreed to services and verbal consent obtained.   Follow up plan:  Telephone appointment with care coordination team member scheduled for:  01/09/2022  Encounter Outcome:  Pt. Scheduled  Noreene Larsson, [place], [place] [address] Direct Dial: [phone] [email or web address]

## 2021-12-19 ENCOUNTER — Other Ambulatory Visit: Payer: Self-pay

## 2021-12-19 MED ORDER — OMEPRAZOLE 40 MG PO CPDR: 40.0000 mg | DELAYED_RELEASE_CAPSULE | Freq: Two times a day (BID) | ORAL | 0 refills | Status: DC

## 2021-12-30 ENCOUNTER — Telehealth: Payer: Self-pay

## 2021-12-30 NOTE — Telephone Encounter (Signed)
Daughter would like to get labs done that were ordered 12/12/21 originally her father declined during visit since just had them done that previous Monday- since discharged from facility wants to be sure that the infection is gone. Advised orders are in and she can take him by [facility] lab before appt with PCP at [facility] tomorrow. Advised her that I will update [facility] ID on decision to get labs done so they can look out for results.   Daughter  [phone]

## 2021-12-31 ENCOUNTER — Other Ambulatory Visit
Admission: RE | Admit: 2021-12-31 | Discharge: 2021-12-31 | Disposition: A | Discharge status: Home / Self Care | Payer: Medicare Other | Attending: Infectious Diseases | Admitting: Infectious Diseases

## 2021-12-31 DIAGNOSIS — M778 Other enthesopathies, not elsewhere classified: Secondary | ICD-10-CM | POA: Diagnosis not present

## 2021-12-31 DIAGNOSIS — L97511 Non-pressure chronic ulcer of other part of right foot limited to breakdown of skin: Secondary | ICD-10-CM | POA: Diagnosis not present

## 2021-12-31 DIAGNOSIS — R7881 Bacteremia: Secondary | ICD-10-CM | POA: Insufficient documentation

## 2021-12-31 DIAGNOSIS — B9561 Methicillin susceptible Staphylococcus aureus infection as the cause of diseases classified elsewhere: Secondary | ICD-10-CM | POA: Diagnosis not present

## 2021-12-31 LAB — SEDIMENTATION RATE: Sed Rate: 11 mm/hr (ref 0–20)

## 2021-12-31 LAB — CBC WITH DIFFERENTIAL/PLATELET
Abs Immature Granulocytes: 0.03 10*3/uL (ref 0.00–0.07)
Basophils Absolute: 0.1 10*3/uL (ref 0.0–0.1)
Basophils Relative: 1 %
Eosinophils Absolute: 0.1 10*3/uL (ref 0.0–0.5)
Eosinophils Relative: 2 %
HCT: 42.8 % (ref 39.0–52.0)
Hemoglobin: 13.9 g/dL (ref 13.0–17.0)
Immature Granulocytes: 1 %
Lymphocytes Relative: 17 %
Lymphs Abs: 0.9 10*3/uL (ref 0.7–4.0)
MCH: 30.3 pg (ref 26.0–34.0)
MCHC: 32.5 g/dL (ref 30.0–36.0)
MCV: 93.2 fL (ref 80.0–100.0)
Monocytes Absolute: 0.5 10*3/uL (ref 0.1–1.0)
Monocytes Relative: 9 %
Neutro Abs: 3.8 10*3/uL (ref 1.7–7.7)
Neutrophils Relative %: 70 %
Platelets: 244 10*3/uL (ref 150–400)
RBC: 4.59 MIL/uL (ref 4.22–5.81)
RDW: 13.8 % (ref 11.5–15.5)
WBC: 5.3 10*3/uL (ref 4.0–10.5)
nRBC: 0 % (ref 0.0–0.2)

## 2021-12-31 LAB — COMPREHENSIVE METABOLIC PANEL
ALT: 13 U/L (ref 0–44)
AST: 19 U/L (ref 15–41)
Albumin: 4.1 g/dL (ref 3.5–5.0)
Alkaline Phosphatase: 67 U/L (ref 38–126)
Anion gap: 11 (ref 5–15)
BUN: 21 mg/dL (ref 8–23)
CO2: 26 mmol/L (ref 22–32)
Calcium: 9.2 mg/dL (ref 8.9–10.3)
Chloride: 100 mmol/L (ref 98–111)
Creatinine, Ser: 0.96 mg/dL (ref 0.61–1.24)
GFR, Estimated: >60 mL/min (ref >60)
Glucose, Bld: 101 mg/dL — ABNORMAL HIGH (ref 70–99)
Potassium: 3.5 mmol/L (ref 3.5–5.1)
Sodium: 137 mmol/L (ref 135–145)
Total Bilirubin: 1.3 mg/dL — ABNORMAL HIGH (ref 0.3–1.2)
Total Protein: 7.4 g/dL (ref 6.5–8.1)

## 2021-12-31 LAB — C-REACTIVE PROTEIN: CRP: <0.5 mg/dL (ref <1.0)

## 2022-01-01 ENCOUNTER — Telehealth: Payer: Self-pay

## 2022-01-01 NOTE — Telephone Encounter (Signed)
-----   Message from Tsosie Billing, MD sent at 01/01/2022  3:53 PM EDT ----- Please let the patient and the daughter know that the labs look great and no sign of infection- HE would have completed his antibiotic on 12/28/21 and he does not need any more.  ----- Message ----- From: Buel Ream, Lab In [place] Sent: 12/31/2021  10:18 AM EDT To: Tsosie Billing, MD

## 2022-01-06 ENCOUNTER — Encounter (INDEPENDENT_AMBULATORY_CARE_PROVIDER_SITE_OTHER): Payer: Self-pay

## 2022-01-09 ENCOUNTER — Ambulatory Visit: Payer: Self-pay | Admitting: *Deleted

## 2022-01-09 NOTE — Patient Outreach (Signed)
  Care Coordination   01/09/2022 Name: HAFIZ IRION MRN: [id number] DOB: [birth year]   Care Coordination Outreach Attempts:  An unsuccessful telephone outreach was attempted for a scheduled appointment today.  Follow Up Plan:  Additional outreach attempts will be made to offer the patient care coordination information and services.   Encounter Outcome:  No Answer  Care Coordination Interventions Activated:  No   Care Coordination Interventions:  No, not indicated    Valente David, RN, MSN, [facility] Care Management Care Management Coordinator [phone]

## 2022-01-17 ENCOUNTER — Telehealth: Payer: Self-pay | Admitting: *Deleted

## 2022-01-17 NOTE — Progress Notes (Signed)
  Care Coordination Note  01/17/2022 Name: SALAM MICUCCI MRN: [id number] DOB: [birth year]  [name] is a 71 y.o. year old male who is a primary care patient of Dion Body, MD and is actively engaged with the care management team. I reached out to [name] by phone today to assist with re-scheduling an initial visit with the RN Case Manager  Follow up plan: Unsuccessful telephone outreach attempt made. A HIPAA compliant phone message was left for the patient providing contact information and requesting a return call.   Julian Hy, [place] Direct Dial: [phone]

## 2022-01-28 NOTE — Progress Notes (Signed)
  Care Coordination Note  01/28/2022 Name: GIBBS NAUGLE MRN: [id number] DOB: 08/13/1950  [name] is a 71 y.o. year old male who is a primary care patient of Dion Body, MD and is actively engaged with the care management team. I reached out to [name] by phone today to assist with re-scheduling an initial visit with the RN Case Manager  Follow up plan: Patient declines further follow up and engagement by the care management team. Appropriate care team members and provider have been notified via electronic communication.   Julian Hy, [place] Direct Dial: [phone]

## 2022-02-04 ENCOUNTER — Telehealth (INDEPENDENT_AMBULATORY_CARE_PROVIDER_SITE_OTHER): Payer: Medicare Other | Admitting: Gastroenterology

## 2022-02-04 ENCOUNTER — Encounter: Payer: Self-pay | Admitting: Gastroenterology

## 2022-02-04 DIAGNOSIS — R066 Hiccough: Secondary | ICD-10-CM

## 2022-02-04 DIAGNOSIS — R933 Abnormal findings on diagnostic imaging of other parts of digestive tract: Secondary | ICD-10-CM | POA: Diagnosis not present

## 2022-02-04 MED ORDER — PANTOPRAZOLE SODIUM 40 MG PO TBEC: 40.0000 mg | DELAYED_RELEASE_TABLET | Freq: Two times a day (BID) | ORAL | 1 refills | Status: DC

## 2022-02-04 NOTE — Progress Notes (Signed)
John Booker , MD [address]  [place], [place] [address]  Main: [phone]  Fax: [phone]   Primary Care Physician: Dion Body, MD  Virtual Visit via Video Note  I connected with patient on 02/04/22 at  1:00 PM EST by video and verified that I am speaking with the correct person using two identifiers.   I discussed the limitations, risks, security and privacy concerns of performing an evaluation and management service by video  and the availability of in person appointments. I also discussed with the patient that there may be a patient responsible charge related to this service. The patient expressed understanding and agreed to proceed.  Location of Patient: Home Location of Provider: Home Persons involved: Patient and provider only   History of Present Illness: No chief complaint on file.   HPI: John Booker is a 71 y.o. male   Summary of history :  He is here today to follow-up.  Transfer of care in July 2023 for hiccups ongoing for over 6 months Underwent an upper endoscopy in March 2020 that showed no gross abnormalities.  Hiatal hernia was noted.Subsequently the patient was admitted to [facility] in October 2020.  Prior history of alcohol abuse hypertension and on admission was noted to have intractable hiccups and weight loss was placed on a Thorazine drip and GI was consulted for a weight loss of over 20 pounds in 2 years.  Barium swallow showed esophageal dysmotility and spasm and inability of the lower esophagus to relax and there were concerns for obstructive mass.  Underwent an EGD was recommended outpatient CT chest abdomen and pelvis, colonoscopy for weight loss, outpatient esophageal manometry and motility.   12/28/2020 EGD: Showed erosions in the duodenal bulb hiatal hernia erythema in the stomach abnormal motility was noted in the esophagus increased tone of the LES dilated empirically to 51 French biopsies showed chronic duodenitis  reactive gastropathy  06/27/2021 creatinine 1.01 UDS positive for opiates hemoglobin 16.7 g hepatic function panel showed no significant abnormality.  He has had hiccups for over 5 years responded to baclofen to a great extent and at that visit he also had some dysphagia on and off.  Interval history 09/24/2021-02/04/2022  10/09/2021: EGD: LA grade B esophagitis seen otherwise no gross abnormalities.  10/01/2021 CT chest abdomen pelvis showed mixed fat and soft tissue lesion in the posterior mediastinum and PET scan recommended enhancing non-Dula focus in the posterior urinary bladder measuring 11 mm urology consult recommended retained versus reflux material in the esophagus with diffuse esophageal wall thickening suggestive of reflux esophagitis.  Colonic diverticulosis.  10/10/2021 MRI thoracic spine shows partially fat density enhancing masses in the left paraspinal soft tissue at T9-T10 with 3 distinct lesions identified PET scan performed on 11/13/2021 showed no evidence of malignancy left thoracic paraspinal mixed fat and soft tissue attenuation shows absence of hypermetabolic him suggest continued follow-up by CT chest in 6 months.  11/15/2021 CT cervical spine shows multilevel foraminal stenosis ossification of the posterior ligament resulting in severe central canal stenosis at C3-C4 and to lesser extent at C2-C3  11/20/2021 MRI lumbar spine shows 2 small soft tissue fluid collections arising from L5-S1 lower lumbar predominantly degenerative disc disease  He was seen by urology and underwent a cystoscopy which did not reveal any bladder mass  He has not discussed about the MRI findings of his neck or CT scan findings of his neck with his primary care doctor.  His daughter said that  he took the Protonix for a short period of time did not help and hence stopped it.  He continues to have reflux associated regurgitation his daughter feels that these reflux and regurgitation have a  correlation.   Current Outpatient Medications  Medication Sig Dispense Refill   acetaminophen (TYLENOL) 325 MG tablet Take 2 tablets (650 mg total) by mouth every 6 (six) hours as needed for mild pain or fever. 60 tablet 0   aspirin EC 81 MG EC tablet Take 1 tablet (81 mg total) by mouth daily. Swallow whole. 30 tablet 11   baclofen (LIORESAL) 10 MG tablet Take 1 tablet (10 mg total) by mouth 2 (two) times daily. 180 each 3   feeding supplement (ENSURE ENLIVE / ENSURE PLUS) LIQD Take 237 mLs by mouth 3 (three) times daily between meals. 237 mL 12   furosemide (LASIX) 40 MG tablet Take 40 mg by mouth daily.     losartan (COZAAR) 100 MG tablet Take 1 tablet (100 mg total) by mouth daily. 90 tablet 1   Multiple Vitamin (MULTIVITAMIN WITH MINERALS) TABS tablet Take 1 tablet by mouth daily.     omeprazole (PRILOSEC) 40 MG capsule Take 1 capsule (40 mg total) by mouth 2 times daily at 12 noon and 4 pm. 240 capsule 0   potassium chloride SA (KLOR-CON M) 20 MEQ tablet Take 2 tablets (40 mEq total) by mouth daily for 3 days. 6 tablet 0   sildenafil (REVATIO) 20 MG tablet Take 1-5 pills about 30 min prior to sex. Start with 1 and increase as needed. 30 tablet 3   vitamin B-12 1000 MCG tablet Take 1 tablet (1,000 mcg total) by mouth daily. 30 tablet 1   No current facility-administered medications for this visit.    Allergies as of 02/04/2022 - Review Complete 12/12/2021  Allergen Reaction Noted   Adalat [nifedipine]  12/05/2015   Amlodipine Other (See Comments) 07/14/2017    Review of Systems:    All systems reviewed and negative except where noted in HPI.  General Appearance:    Alert, cooperative, no distress, appears stated age  Head:    Normocephalic, without obvious abnormality, atraumatic  Eyes:    PERRL, conjunctiva/corneas clear,  Ears:    Grossly normal hearing    Neurologic:  Grossly normal    Observations/Objective:  Labs: CMP     Component Value Date/Time   NA 137  12/31/2021 1014   NA 135 (L) 04/15/2012 2049   K 3.5 12/31/2021 1014   K 3.5 04/15/2012 2049   CL 100 12/31/2021 1014   CL 98 04/15/2012 2049   CO2 26 12/31/2021 1014   CO2 30 04/15/2012 2049   GLUCOSE 101 (H) 12/31/2021 1014   GLUCOSE 107 (H) 04/15/2012 2049   BUN 21 12/31/2021 1014   BUN 17 04/15/2012 2049   CREATININE 0.96 12/31/2021 1014   CREATININE 1.15 12/20/2020 0946   CALCIUM 9.2 12/31/2021 1014   CALCIUM 8.5 04/15/2012 2049   PROT 7.4 12/31/2021 1014   PROT 7.3 04/15/2012 2049   ALBUMIN 4.1 12/31/2021 1014   ALBUMIN 3.4 04/15/2012 2049   AST 19 12/31/2021 1014   AST 72 (H) 04/15/2012 2049   ALT 13 12/31/2021 1014   ALT 63 04/15/2012 2049   ALKPHOS 67 12/31/2021 1014   ALKPHOS 88 04/15/2012 2049   BILITOT 1.3 (H) 12/31/2021 1014   BILITOT 0.8 04/15/2012 2049   GFRNONAA >60 12/31/2021 1014   GFRNONAA 73 12/20/2019 0916   GFRAA 85 12/20/2019 0916  Lab Results  Component Value Date   WBC 5.3 12/31/2021   HGB 13.9 12/31/2021   HCT 42.8 12/31/2021   MCV 93.2 12/31/2021   PLT 244 12/31/2021    Imaging Studies: No results found.  Assessment and Plan:   YAKIR WENKE is a 71 y.o. y/o male here to see me after being seen by Dr. Virgina Jock in the hospital when he was consulted for hiccups.  Ongoing for over 5 years and EGD showed increased tone of the lower esophagealsphincter.  Recent EGD showed esophagitis .  Causes for hiccups may be central in the brain or peripheral along the course of the phrenic nerve or related to the diaphragm versus significant reflux causing the same.  We talked about a colonoscopy but he is not keen at this point of time this is probably unrelated to his reflux     Plan 1.  MRI of the brain. 2.  Repeat EGD to check for healing of esophagitis and evaluation of achalasia 8 to 12 weeks after commencing on Protonix 40 mg twice daily new prescription sent to the pharmacy, colonoscopy to complete evaluation of unintentional weight loss and colon  cancer screening 3.  After his MRI brain results I will speak to him if he has noticed some improvement in his hiccups with high-dose PPI then it is possible that the reflux is the cause of his hiccups at that point of time we can add further acid suppressing medications such as a new sodium pump inhibitor.  We can also perform Bravo pH testing and see if the episodes of reflux and hiccups correlate with each other if they do then he may need evaluation for antireflux surgery at [facility] or [facility].   4.  If the MRI brain shows an abnormality then we will address that as felt needed.    5.  I have advised him to discuss the MRI neck and CT scan neck findings with his primary care physician      I discussed the assessment and treatment plan with the patient. The patient was provided an opportunity to ask questions and all were answered. The patient agreed with the plan and demonstrated an understanding of the instructions.   The patient was advised to call back or seek an in-person evaluation if the symptoms worsen or if the condition fails to improve as anticipated.  A total of 48 minutes was spent on this visit reviewing previous notes, counseling the patient on acid reflux, ordering tests such as MRI, adjusting meds, and documenting the findings in the note.    I provided 25 minutes of face-to-face time during this encounter.  Dr John Bellows MD,MRCP [facility]) Gastroenterology/Hepatology Pager: [phone]   Speech recognition software was used to dictate this note.

## 2022-02-04 NOTE — Telephone Encounter (Signed)
Lets get the MRI brain and touch base as soon as it is done let me know and we will talk again.  Is he taking any baclofen for hiccups?

## 2022-02-05 ENCOUNTER — Telehealth: Payer: Self-pay

## 2022-02-05 MED ORDER — ONDANSETRON HCL 4 MG PO TABS: 4.0000 mg | ORAL_TABLET | Freq: Three times a day (TID) | ORAL | 0 refills | Status: DC | PRN

## 2022-02-05 NOTE — Addendum Note (Signed)
Addended by: Wayna Chalet on: 02/05/2022 10:52 AM   Modules accepted: Orders

## 2022-02-05 NOTE — Telephone Encounter (Signed)
Called Dr. Raylene Miyamoto office and left a message with the receptionist letting him know that Dr. Vicente Males wanted him to know that the patient is having active suicidal thoughts and that he recommended for him to go to the Emergency Room. This was communicated to his daughter Orson Slick. I was told by the receptionist that she would be sending the message to Dr. Netty Starring. I told her that if there was anything else for [place] to do for the patient to please let us know. She stated that she would.

## 2022-02-05 NOTE — Telephone Encounter (Signed)
Called [place] but she did not answer. I left her a voicemail letting her know that the MRI for her dad was scheduled and that she will see it on his [place] appointments. I also asked if she had taken her dad to the emergency room as advised by Dr. Vicente Males yesterday on his Video Visit, medication for acid reflux and nausea was sent to his pharmacy-[facility] and lastly, I let her know that Dr. Vicente Males wanted me to let his primary care doctor-Dr. Netty Starring know about her dad's active suicidal thoughts and to let us know what else we could do and/or if he could reach out to her dad. I told her to give me a call or send me a message through MyChart if she had any questions.

## 2022-02-11 DIAGNOSIS — M216X1 Other acquired deformities of right foot: Secondary | ICD-10-CM | POA: Diagnosis not present

## 2022-02-11 DIAGNOSIS — M778 Other enthesopathies, not elsewhere classified: Secondary | ICD-10-CM | POA: Diagnosis not present

## 2022-02-11 DIAGNOSIS — Z872 Personal history of diseases of the skin and subcutaneous tissue: Secondary | ICD-10-CM | POA: Diagnosis not present

## 2022-02-13 ENCOUNTER — Ambulatory Visit
Admission: RE | Admit: 2022-02-13 | Discharge: 2022-02-13 | Disposition: A | Discharge status: Home / Self Care | Payer: Medicare Other | Source: Ambulatory Visit | Attending: Gastroenterology | Admitting: Gastroenterology

## 2022-02-13 DIAGNOSIS — G319 Degenerative disease of nervous system, unspecified: Secondary | ICD-10-CM | POA: Diagnosis not present

## 2022-02-13 DIAGNOSIS — R066 Hiccough: Secondary | ICD-10-CM | POA: Diagnosis not present

## 2022-02-13 MED ORDER — GADOBUTROL 1 MMOL/ML IV SOLN
10.0000 mL | Freq: Once | INTRAVENOUS | Status: AC | PRN
  Administered 2022-02-13: 10 mL via INTRAVENOUS

## 2022-02-17 ENCOUNTER — Telehealth (INDEPENDENT_AMBULATORY_CARE_PROVIDER_SITE_OTHER): Payer: Medicare Other | Admitting: Gastroenterology

## 2022-02-17 ENCOUNTER — Other Ambulatory Visit: Payer: Self-pay

## 2022-02-17 DIAGNOSIS — R066 Hiccough: Secondary | ICD-10-CM

## 2022-02-17 MED ORDER — GABAPENTIN 100 MG PO CAPS: 100.0000 mg | ORAL_CAPSULE | Freq: Three times a day (TID) | ORAL | 1 refills | Status: DC

## 2022-02-17 NOTE — Progress Notes (Signed)
Normal

## 2022-02-17 NOTE — Addendum Note (Signed)
Addended by: Wayna Chalet on: 02/17/2022 03:27 PM   Modules accepted: Orders

## 2022-02-17 NOTE — Progress Notes (Signed)
John Booker , MD [address]  [place]  [place], [place] [address]  Main: [phone]  Fax: [phone]   Primary Care Physician: Dion Body, MD  Virtual Visit via Video Note  I connected with patient on 02/17/22 at  3:15 PM EST by video and verified that I am speaking with the correct person using two identifiers.   I discussed the limitations, risks, security and privacy concerns of performing an evaluation and management service by video  and the availability of in person appointments. I also discussed with the patient that there may be a patient responsible charge related to this service. The patient expressed understanding and agreed to proceed.  Location of Patient: Home Location of Provider: Home Persons involved: Patient and provider only   History of Present Illness: No chief complaint on file.   HPI: John Booker is a 71 y.o. male  Summary of history :   He is here today to follow-up.  Transfer of care in July 2023 for hiccups ongoing for over 6 months Underwent an upper endoscopy in March 2020 that showed no gross abnormalities.  Hiatal hernia was noted.Subsequently the patient was admitted to [facility] in October 2020.  Prior history of alcohol abuse hypertension and on admission was noted to have intractable hiccups and weight loss was placed on a Thorazine drip and GI was consulted for a weight loss of over 20 pounds in 2 years.  Barium swallow showed esophageal dysmotility and spasm and inability of the lower esophagus to relax and there were concerns for obstructive mass.  Underwent an EGD was recommended outpatient CT chest abdomen and pelvis, colonoscopy for weight loss, outpatient esophageal manometry and motility.   12/28/2020 EGD: Showed erosions in the duodenal bulb hiatal hernia erythema in the stomach abnormal motility was noted in the esophagus increased tone of the LES dilated empirically to 51 French biopsies showed chronic duodenitis  reactive gastropathy  06/27/2021 creatinine 1.01 UDS positive for opiates hemoglobin 16.7 g hepatic function panel showed no significant abnormality.   He has had hiccups for over 5 years responded to baclofen to a great extent and at that visit he also had some dysphagia on and off.  10/09/2021: EGD: LA grade B esophagitis seen otherwise no gross abnormalities.   10/01/2021 CT chest abdomen pelvis showed mixed fat and soft tissue lesion in the posterior mediastinum and PET scan recommended enhancing non-Dula focus in the posterior urinary bladder measuring 11 mm urology consult recommended retained versus reflux material in the esophagus with diffuse esophageal wall thickening suggestive of reflux esophagitis.  Colonic diverticulosis.   10/10/2021 MRI thoracic spine shows partially fat density enhancing masses in the left paraspinal soft tissue at T9-T10 with 3 distinct lesions identified PET scan performed on 11/13/2021 showed no evidence of malignancy left thoracic paraspinal mixed fat and soft tissue attenuation shows absence of hypermetabolic him suggest continued follow-up by CT chest in 6 months.   11/15/2021 CT cervical spine shows multilevel foraminal stenosis ossification of the posterior ligament resulting in severe central canal stenosis at C3-C4 and to lesser extent at C2-C3   11/20/2021 MRI lumbar spine shows 2 small soft tissue fluid collections arising from L5-S1 lower lumbar predominantly degenerative disc disease   He was seen by urology and underwent a cystoscopy which did not reveal any bladder mass    Interval history 02/04/2022-04/20/2021   02/14/2022 MRI brain no acute intracranial abnormality or mass mild cerebral atrophy At this last visit we put him  on Protonix 40 mg twice daily no improvement with his hiccups  The following day after his last visit he had expressed active suicidal thoughts and we are asked him to either go to the ER or see his primary care doctor to discuss  further we communicated that to Dr. Anitra Lauth to him and his daughter hiccups persists taking baclofen.        Current Outpatient Medications  Medication Sig Dispense Refill   gabapentin (NEURONTIN) 100 MG capsule Take 1 capsule (100 mg total) by mouth 3 (three) times daily. 180 capsule 1   acetaminophen (TYLENOL) 325 MG tablet Take 2 tablets (650 mg total) by mouth every 6 (six) hours as needed for mild pain or fever. 60 tablet 0   aspirin EC 81 MG EC tablet Take 1 tablet (81 mg total) by mouth daily. Swallow whole. 30 tablet 11   escitalopram (LEXAPRO) 5 MG tablet Take 5 mg by mouth daily.     feeding supplement (ENSURE ENLIVE / ENSURE PLUS) LIQD Take 237 mLs by mouth 3 (three) times daily between meals. 237 mL 12   furosemide (LASIX) 40 MG tablet Take 40 mg by mouth daily.     losartan (COZAAR) 100 MG tablet Take 1 tablet (100 mg total) by mouth daily. 90 tablet 1   Multiple Vitamin (MULTIVITAMIN WITH MINERALS) TABS tablet Take 1 tablet by mouth daily.     ondansetron (ZOFRAN) 4 MG tablet Take 1 tablet (4 mg total) by mouth every 8 (eight) hours as needed for nausea or vomiting. 20 tablet 0   pantoprazole (PROTONIX) 40 MG tablet Take 1 tablet (40 mg total) by mouth 2 (two) times daily. 180 tablet 1   potassium chloride SA (KLOR-CON M) 20 MEQ tablet Take 2 tablets (40 mEq total) by mouth daily for 3 days. 6 tablet 0   sildenafil (REVATIO) 20 MG tablet Take 1-5 pills about 30 min prior to sex. Start with 1 and increase as needed. 30 tablet 3   vitamin B-12 1000 MCG tablet Take 1 tablet (1,000 mcg total) by mouth daily. 30 tablet 1   No current facility-administered medications for this visit.    Allergies as of 02/17/2022 - Review Complete 12/12/2021  Allergen Reaction Noted   Adalat [nifedipine]  12/05/2015   Amlodipine Other (See Comments) 07/14/2017    Review of Systems:    All systems reviewed and negative except where noted in HPI.  General Appearance:    Alert,  cooperative, no distress, appears stated age  Head:    Normocephalic, without obvious abnormality, atraumatic  Eyes:    PERRL, conjunctiva/corneas clear,  Ears:    Grossly normal hearing    Neurologic:  Grossly normal    Observations/Objective:  Labs: CMP     Component Value Date/Time   NA 137 12/31/2021 1014   NA 135 (L) 04/15/2012 2049   K 3.5 12/31/2021 1014   K 3.5 04/15/2012 2049   CL 100 12/31/2021 1014   CL 98 04/15/2012 2049   CO2 26 12/31/2021 1014   CO2 30 04/15/2012 2049   GLUCOSE 101 (H) 12/31/2021 1014   GLUCOSE 107 (H) 04/15/2012 2049   BUN 21 12/31/2021 1014   BUN 17 04/15/2012 2049   CREATININE 0.96 12/31/2021 1014   CREATININE 1.15 12/20/2020 0946   CALCIUM 9.2 12/31/2021 1014   CALCIUM 8.5 04/15/2012 2049   PROT 7.4 12/31/2021 1014   PROT 7.3 04/15/2012 2049   ALBUMIN 4.1 12/31/2021 1014   ALBUMIN 3.4  04/15/2012 2049   AST 19 12/31/2021 1014   AST 72 (H) 04/15/2012 2049   ALT 13 12/31/2021 1014   ALT 63 04/15/2012 2049   ALKPHOS 67 12/31/2021 1014   ALKPHOS 88 04/15/2012 2049   BILITOT 1.3 (H) 12/31/2021 1014   BILITOT 0.8 04/15/2012 2049   GFRNONAA >60 12/31/2021 1014   GFRNONAA 73 12/20/2019 0916   GFRAA 85 12/20/2019 0916   Lab Results  Component Value Date   WBC 5.3 12/31/2021   HGB 13.9 12/31/2021   HCT 42.8 12/31/2021   MCV 93.2 12/31/2021   PLT 244 12/31/2021    Imaging Studies: MR BRAIN W WO CONTRAST  Result Date: 02/14/2022 CLINICAL DATA:  Hiccups. EXAM: MRI HEAD WITHOUT AND WITH CONTRAST TECHNIQUE: Multiplanar, multiecho pulse sequences of the brain and surrounding structures were obtained without and with intravenous contrast. CONTRAST:  33m GADAVIST GADOBUTROL 1 MMOL/ML IV SOLN COMPARISON:  Head CT 11/15/2021 and MRI 12/29/2020 FINDINGS: Brain: There is no evidence of an acute infarct, intracranial hemorrhage, mass, midline shift, or extra-axial fluid collection. Mild cerebral atrophy is unchanged from the prior MRI.  Periventricular white matter T2 hyperintensities are also unchanged and nonspecific but compatible with minimal chronic small vessel ischemic disease which is not greater than expected for age. No abnormal enhancement is identified. Vascular: Major intracranial vascular flow voids are preserved. Skull and upper cervical spine: Unremarkable bone marrow signal. Sinuses/Orbits: Unremarkable orbits. Paranasal sinuses and mastoid air cells are clear. Other: None. IMPRESSION: 1. No acute intracranial abnormality or mass. 2. Mild cerebral atrophy. Electronically Signed   By: ALogan BoresM.D.   On: 02/14/2022 11:33    Assessment and Plan:   John FORGETis a 71y.o. y/o male  here to see me  for hiccups.  Ongoing for over 5 years and EGD showed increased tone of the lower esophagealsphincter.  Recent EGD showed esophagitis .  Causes for hiccups may be central in the brain or peripheral along the course of the phrenic nerve or related to the diaphragm versus significant reflux causing the same.  We talked about a colonoscopy but he is not keen at this point of time this is probably unrelated to his reflux.  Talked about repeating his upper endoscopy to check for healing of the esophagitis they would like to think about it     Plan 1.  Further evaluation with hiccups recommended refer to neurology 2.  From the therapeutics point of view since baclofen has not worked we will stop it and commence him on gabapentin 100 mg 3 times daily.  Explained it will take a few weeks to see if it works.  If it does work he has been advised to stop it a few days after.  I will call him in 4 weeks to check how he is doing.   I have explained to them in detail that the causes of hiccups that are chronic are multiple not just related to gastroenterological conditions.  I explained I have done as much workup as I could do from the GI point of view and given my recommendations hence I am referring further to neurology to see if  there is anything else they can do.     I discussed the assessment and treatment plan with the patient. The patient was provided an opportunity to ask questions and all were answered. The patient agreed with the plan and demonstrated an understanding of the instructions.   The patient was advised to call back  or seek an in-person evaluation if the symptoms worsen or if the condition fails to improve as anticipated.  I provided 20 minutes of face-to-face time during this encounter.  Dr John Bellows MD,MRCP [facility]) Gastroenterology/Hepatology Pager: [phone]   Speech recognition software was used to dictate this note.

## 2022-02-18 ENCOUNTER — Other Ambulatory Visit: Payer: Self-pay

## 2022-02-18 ENCOUNTER — Emergency Department: Payer: Medicare Other

## 2022-02-18 ENCOUNTER — Observation Stay
Admission: EM | Admit: 2022-02-18 | Discharge: 2022-02-19 | Disposition: A | Discharge status: Home / Self Care | Payer: Medicare Other | Attending: Internal Medicine | Admitting: Internal Medicine

## 2022-02-18 DIAGNOSIS — R6511 Systemic inflammatory response syndrome (SIRS) of non-infectious origin with acute organ dysfunction: Secondary | ICD-10-CM | POA: Insufficient documentation

## 2022-02-18 DIAGNOSIS — J9859 Other diseases of mediastinum, not elsewhere classified: Secondary | ICD-10-CM

## 2022-02-18 DIAGNOSIS — R109 Unspecified abdominal pain: Secondary | ICD-10-CM | POA: Insufficient documentation

## 2022-02-18 DIAGNOSIS — E876 Hypokalemia: Secondary | ICD-10-CM | POA: Diagnosis present

## 2022-02-18 DIAGNOSIS — I700 Atherosclerosis of aorta: Secondary | ICD-10-CM | POA: Diagnosis not present

## 2022-02-18 DIAGNOSIS — N179 Acute kidney failure, unspecified: Secondary | ICD-10-CM | POA: Diagnosis not present

## 2022-02-18 DIAGNOSIS — R531 Weakness: Secondary | ICD-10-CM

## 2022-02-18 DIAGNOSIS — I130 Hypertensive heart and chronic kidney disease with heart failure and stage 1 through stage 4 chronic kidney disease, or unspecified chronic kidney disease: Secondary | ICD-10-CM | POA: Insufficient documentation

## 2022-02-18 DIAGNOSIS — R6510 Systemic inflammatory response syndrome (SIRS) of non-infectious origin without acute organ dysfunction: Secondary | ICD-10-CM

## 2022-02-18 DIAGNOSIS — Z8619 Personal history of other infectious and parasitic diseases: Secondary | ICD-10-CM | POA: Diagnosis not present

## 2022-02-18 DIAGNOSIS — R2689 Other abnormalities of gait and mobility: Secondary | ICD-10-CM | POA: Insufficient documentation

## 2022-02-18 DIAGNOSIS — Z1152 Encounter for screening for COVID-19: Secondary | ICD-10-CM | POA: Insufficient documentation

## 2022-02-18 DIAGNOSIS — Z7982 Long term (current) use of aspirin: Secondary | ICD-10-CM | POA: Diagnosis not present

## 2022-02-18 DIAGNOSIS — Z79899 Other long term (current) drug therapy: Secondary | ICD-10-CM | POA: Insufficient documentation

## 2022-02-18 DIAGNOSIS — Z87891 Personal history of nicotine dependence: Secondary | ICD-10-CM | POA: Insufficient documentation

## 2022-02-18 DIAGNOSIS — N189 Chronic kidney disease, unspecified: Secondary | ICD-10-CM | POA: Diagnosis not present

## 2022-02-18 DIAGNOSIS — N200 Calculus of kidney: Secondary | ICD-10-CM | POA: Diagnosis not present

## 2022-02-18 DIAGNOSIS — Z87898 Personal history of other specified conditions: Secondary | ICD-10-CM

## 2022-02-18 DIAGNOSIS — I5032 Chronic diastolic (congestive) heart failure: Secondary | ICD-10-CM | POA: Diagnosis not present

## 2022-02-18 DIAGNOSIS — R0602 Shortness of breath: Secondary | ICD-10-CM | POA: Diagnosis not present

## 2022-02-18 DIAGNOSIS — R262 Difficulty in walking, not elsewhere classified: Secondary | ICD-10-CM

## 2022-02-18 DIAGNOSIS — I10 Essential (primary) hypertension: Secondary | ICD-10-CM | POA: Diagnosis present

## 2022-02-18 DIAGNOSIS — R066 Hiccough: Secondary | ICD-10-CM | POA: Diagnosis present

## 2022-02-18 HISTORY — DX: Personal history of other specified conditions: Z87.898

## 2022-02-18 LAB — BASIC METABOLIC PANEL
Anion gap: 14 (ref 5–15)
BUN: 23 mg/dL (ref 8–23)
CO2: 28 mmol/L (ref 22–32)
Calcium: 9.1 mg/dL (ref 8.9–10.3)
Chloride: 90 mmol/L — ABNORMAL LOW (ref 98–111)
Creatinine, Ser: 1.56 mg/dL — ABNORMAL HIGH (ref 0.61–1.24)
GFR, Estimated: 47 mL/min — ABNORMAL LOW (ref >60)
Glucose, Bld: 112 mg/dL — ABNORMAL HIGH (ref 70–99)
Potassium: 2.9 mmol/L — ABNORMAL LOW (ref 3.5–5.1)
Sodium: 132 mmol/L — ABNORMAL LOW (ref 135–145)

## 2022-02-18 LAB — URINALYSIS, ROUTINE W REFLEX MICROSCOPIC
Bilirubin Urine: NEGATIVE
Glucose, UA: NEGATIVE mg/dL
Ketones, ur: 5 mg/dL — AB
Leukocytes,Ua: NEGATIVE
Nitrite: NEGATIVE
Protein, ur: 30 mg/dL — AB
Specific Gravity, Urine: 1.025 (ref 1.005–1.030)
pH: 5 (ref 5.0–8.0)

## 2022-02-18 LAB — LACTIC ACID, PLASMA: Lactic Acid, Venous: 1 mmol/L (ref 0.5–1.9)

## 2022-02-18 LAB — CBC
HCT: 39.2 % (ref 39.0–52.0)
HCT: 32.8 % — ABNORMAL LOW (ref 39.0–52.0)
Hemoglobin: 13.2 g/dL (ref 13.0–17.0)
Hemoglobin: 11.1 g/dL — ABNORMAL LOW (ref 13.0–17.0)
MCH: 31.1 pg (ref 26.0–34.0)
MCH: 31.4 pg (ref 26.0–34.0)
MCHC: 33.7 g/dL (ref 30.0–36.0)
MCHC: 33.8 g/dL (ref 30.0–36.0)
MCV: 92.5 fL (ref 80.0–100.0)
MCV: 92.7 fL (ref 80.0–100.0)
Platelets: 237 10*3/uL (ref 150–400)
Platelets: 189 10*3/uL (ref 150–400)
RBC: 4.24 MIL/uL (ref 4.22–5.81)
RBC: 3.54 MIL/uL — ABNORMAL LOW (ref 4.22–5.81)
RDW: 13.7 % (ref 11.5–15.5)
RDW: 13.6 % (ref 11.5–15.5)
WBC: 9.9 10*3/uL (ref 4.0–10.5)
WBC: 7 10*3/uL (ref 4.0–10.5)
nRBC: 0 % (ref 0.0–0.2)
nRBC: 0 % (ref 0.0–0.2)

## 2022-02-18 LAB — CREATININE, SERUM
Creatinine, Ser: 1.16 mg/dL (ref 0.61–1.24)
GFR, Estimated: >60 mL/min (ref >60)

## 2022-02-18 LAB — RESP PANEL BY RT-PCR (RSV, FLU A&B, COVID)  RVPGX2
Influenza A by PCR: NEGATIVE
Influenza B by PCR: NEGATIVE
Resp Syncytial Virus by PCR: NEGATIVE
SARS Coronavirus 2 by RT PCR: NEGATIVE

## 2022-02-18 LAB — CK: Total CK: 66 U/L (ref 49–397)

## 2022-02-18 LAB — SEDIMENTATION RATE: Sed Rate: 60 mm/hr — ABNORMAL HIGH (ref 0–20)

## 2022-02-18 MED ORDER — IOHEXOL 300 MG/ML  SOLN
100.0000 mL | Freq: Once | INTRAMUSCULAR | Status: AC | PRN
  Administered 2022-02-18: 100 mL via INTRAVENOUS

## 2022-02-18 MED ORDER — ACETAMINOPHEN 325 MG PO TABS: 650.0000 mg | ORAL_TABLET | Freq: Four times a day (QID) | ORAL | Status: DC | PRN

## 2022-02-18 MED ORDER — SALINE SPRAY 0.65 % NA SOLN
1.0000 | NASAL | Status: DC | PRN
  Administered 2022-02-18: 1 via NASAL
  Filled 2022-02-18: qty 44

## 2022-02-18 MED ORDER — LACTATED RINGERS IV BOLUS
2000.0000 mL | Freq: Once | INTRAVENOUS | Status: AC
  Administered 2022-02-18: 2000 mL via INTRAVENOUS

## 2022-02-18 MED ORDER — ONDANSETRON HCL 4 MG/2ML IJ SOLN: 4.0000 mg | Freq: Four times a day (QID) | INTRAMUSCULAR | Status: DC | PRN

## 2022-02-18 MED ORDER — DEXTROSE IN LACTATED RINGERS 5 % IV SOLN: INTRAVENOUS | Status: DC

## 2022-02-18 MED ORDER — ACETAMINOPHEN 650 MG RE SUPP: 650.0000 mg | Freq: Four times a day (QID) | RECTAL | Status: DC | PRN

## 2022-02-18 MED ORDER — POTASSIUM CHLORIDE 20 MEQ PO PACK
40.0000 meq | PACK | Freq: Once | ORAL | Status: AC
  Administered 2022-02-18: 40 meq via ORAL
  Filled 2022-02-18: qty 2

## 2022-02-18 MED ORDER — PANTOPRAZOLE SODIUM 40 MG PO TBEC
40.0000 mg | DELAYED_RELEASE_TABLET | Freq: Two times a day (BID) | ORAL | Status: DC
  Administered 2022-02-18 – 2022-02-19 (×2): 40 mg via ORAL
  Filled 2022-02-18 (×2): qty 1

## 2022-02-18 MED ORDER — ASPIRIN 81 MG PO TBEC
81.0000 mg | DELAYED_RELEASE_TABLET | Freq: Every day | ORAL | Status: DC
  Administered 2022-02-18 – 2022-02-19 (×2): 81 mg via ORAL
  Filled 2022-02-18 (×2): qty 1

## 2022-02-18 MED ORDER — ENOXAPARIN SODIUM 40 MG/0.4ML IJ SOSY
40.0000 mg | PREFILLED_SYRINGE | INTRAMUSCULAR | Status: DC
  Administered 2022-02-18: 40 mg via SUBCUTANEOUS
  Filled 2022-02-18: qty 0.4

## 2022-02-18 MED ORDER — ONDANSETRON HCL 4 MG PO TABS: 4.0000 mg | ORAL_TABLET | Freq: Four times a day (QID) | ORAL | Status: DC | PRN

## 2022-02-18 NOTE — ED Provider Notes (Signed)
[facility] Provider Note   Event Date/Time   First MD Initiated Contact with Patient 02/18/22 1543     (approximate) History  Weakness  HPI John Booker is a 71 y.o. male with a stated past medical history of hypertension, CHF, CKD, and GERD as well as chronic hiccups who presents for generalized weakness that began yesterday and has now progressed to profound weakness in bilateral lower extremities keeping him from walking.  Patient also endorses shortness of breath.  Patient endorses poor p.o. intake over the last few days given his recurrent hiccups making it difficult to eat.  Patient is also concerned as he states the last time he felt this way he was admitted and found to be septic with MSSA as well as a possible spinal fluid collection concerning for possible spinal abscess.  She denies any current back pain or recent falls/trauma ROS: Patient currently denies any vision changes, tinnitus, difficulty speaking, facial droop, sore throat, chest pain, shortness of breath, abdominal pain, nausea/vomiting/diarrhea, dysuria, or numbness/paresthesias in any extremity   Physical Exam  Triage Vital Signs: ED Triage Vitals [02/18/22 1131]  Enc Vitals Group     BP 123/64     Pulse Rate (!) 113     Resp 18     Temp (!) 97.5 F (36.4 C)     Temp Source Oral     SpO2 99 %     Weight      Height      Head Circumference      Peak Flow      Pain Score      Pain Loc      Pain Edu?      Excl. in [place]?    Most recent vital signs: Vitals:   02/18/22 1531 02/18/22 1614  BP: 100/61   Pulse: (!) 109   Resp: 18   Temp:  99.2 F (37.3 C)  SpO2: 100%    General: Awake, oriented x4. CV:  Good peripheral perfusion.  Resp:  Normal effort.  Abd:  No distention.  Other:  Elderly overweight Caucasian male laying in bed in no acute distress.  4/5 strength in bilateral lower extremities.  Sensation and pulses intact in bilateral lower extremities ED Results / Procedures  / Treatments  Labs (all labs ordered are listed, but only abnormal results are displayed) Labs Reviewed  BASIC METABOLIC PANEL - Abnormal; Notable for the following components:      Result Value   Sodium 132 (*)    Potassium 2.9 (*)    Chloride 90 (*)    Glucose, Bld 112 (*)    Creatinine, Ser 1.56 (*)    GFR, Estimated 47 (*)    All other components within normal limits  RESP PANEL BY RT-PCR (RSV, FLU A&B, COVID)  [id number]  CBC  URINALYSIS, ROUTINE W REFLEX MICROSCOPIC  CK  CBG MONITORING, ED   EKG ED ECG REPORT I, Naaman Plummer, the attending physician, personally viewed and interpreted this ECG. Date: 02/18/2022 EKG Time: 1140 Rate: 113 Rhythm: Tachycardic sinus rhythm QRS Axis: normal Intervals: normal ST/T Wave abnormalities: normal Narrative Interpretation: no evidence of acute ischemia RADIOLOGY ED MD interpretation: CT of the abdomen and pelvis with IV contrast interpreted by me and shows no acute findings  One-view portable Chest x-ray interpreted by me shows no evidence of acute abnormalities including no pneumonia, pneumothorax, or widened mediastinum -Agree with radiology assessment Official radiology report(s): CT ABDOMEN PELVIS W CONTRAST  Result  Date: 02/18/2022 CLINICAL DATA:  Abdominal pain, history of spinal abscess EXAM: CT ABDOMEN AND PELVIS WITH CONTRAST TECHNIQUE: Multidetector CT imaging of the abdomen and pelvis was performed using the standard protocol following bolus administration of intravenous contrast. RADIATION DOSE REDUCTION: This exam was performed according to the departmental dose-optimization program which includes automated exposure control, adjustment of the mA and/or kV according to patient size and/or use of iterative reconstruction technique. CONTRAST:  19m OMNIPAQUE IOHEXOL 300 MG/ML  SOLN COMPARISON:  PET-CT 11/12/2021 and previous FINDINGS: Lower chest: No pleural or pericardial effusion. Visualized lung bases clear. Hepatobiliary:  Stable scattered small low-attenuation lesions largest 1.2 cm in segment 2 possibly cysts; no follow-up recommended. No calcified gallstones. The gallbladder is nondilated. No biliary ductal dilatation. Pancreas: A few punctate calcifications in the uncinate process of the pancreas, stable since 04/15/2012. No mass, ductal dilatation, or adjacent inflammatory change. Spleen: Normal in size without focal abnormality. Adrenals/Urinary Tract: No adrenal mass. 4 mm right upper pole renal calculus. 3 mm right mid calculus. 2 mm left lower pole calculus. No hydronephrosis. Urinary bladder incompletely distended. Stomach/Bowel: Stomach is partially distended, unremarkable. Small bowel decompressed. Normal appendix. The colon is incompletely distended, unremarkable. Vascular/Lymphatic: Scattered aortoiliac calcified atheromatous plaque without aneurysm or evident stenosis. No abdominal or pelvic adenopathy. Portal vein patent. Reproductive: Prostate enlargement with central coarse calcifications, protruding into the lumen of the urinary bladder. Other: Right pelvic phleboliths.  No ascites.  No free air. Musculoskeletal: Bilateral hip DJD. Bridging anterior osteophytes from the visualized lower thoracic spine extending through L4. No fracture or acute abnormality. IMPRESSION: 1. No acute findings. 2. Bilateral nephrolithiasis without hydronephrosis. Aortic Atherosclerosis (ICD10-I70.0). Electronically Signed   By: DLucrezia EuropeM.D.   On: 02/18/2022 16:51   DG Chest 2 View  Result Date: 02/18/2022 CLINICAL DATA:  Shortness of breath EXAM: CHEST - 2 VIEW COMPARISON:  11/15/2021 FINDINGS: The heart size and mediastinal contours are within normal limits. No focal airspace consolidation, pleural effusion, or pneumothorax. Chronic findings of diffuse idiopathic skeletal hyperostosis. IMPRESSION: No active cardiopulmonary disease. Electronically Signed   By: NDavina PokeD.O.   On: 02/18/2022 12:11   PROCEDURES: Critical  Care performed: No Procedures MEDICATIONS ORDERED IN ED: Medications  lactated ringers bolus 2,000 mL (1,000 mLs Intravenous New Bag/Given 02/18/22 1613)  potassium chloride (KLOR-CON) packet 40 mEq (40 mEq Oral Given 02/18/22 1603)  iohexol (OMNIPAQUE) 300 MG/ML solution 100 mL (100 mLs Intravenous Contrast Given 02/18/22 1635)   IMPRESSION / MDM / ASSESSMENT AND PLAN / ED COURSE  I reviewed the triage vital signs and the nursing notes.                             The patient is on the cardiac monitor to evaluate for evidence of arrhythmia and/or significant heart rate changes. Patient's presentation is most consistent with acute presentation with potential threat to life or bodily function. This patient presents with generalized weakness and fatigue likely secondary to dehydration. Suspect acute kidney injury of prerenal origin. Doubt intrinsic renal dysfunction or obstructive nephropathy. Considered alternate etiologies of the patients symptoms including infectious processes, severe metabolic derangements or electrolyte abnormalities, ischemia/ACS, heart failure, and intracranial/central processes but think these are unlikely given the history and physical exam.  Plan: labs, 2L fluid resuscitation, pain/nausea control, reassessment  Dispo: admit to medicine   FINAL CLINICAL IMPRESSION(S) / ED DIAGNOSES   Final diagnoses:  Generalized weakness  AKI (acute kidney injury) (  [place])  Ambulatory dysfunction   Rx / DC Orders   ED Discharge Orders     None      Note:  This document was prepared using Dragon voice recognition software and may include unintentional dictation errors.   Naaman Plummer, MD 02/18/22 1700

## 2022-02-18 NOTE — Assessment & Plan Note (Signed)
Followed by GI

## 2022-02-18 NOTE — Assessment & Plan Note (Signed)
Echo 11/18/2021 showed EF 60 to 65% and G1 DD Holding furosemide and losartan overnight due to borderline blood pressure and AKI Daily weights and monitor for overload in view of IV hydration and treatment of AKI

## 2022-02-18 NOTE — Assessment & Plan Note (Signed)
Per ID note in Sept  "MSSA bacteremia- source likely was the toe wounds TEE neg for endocarditis MRI lumbar spine showed small collections B/l facet joint area l5-S1 suggestive of septic arthritis esepcailly with bacteremia, pain and high esr Pt completed 3 weeks of IV cefazolin and left SNF and did not want tot ake any more IV- so he is on PO Keflex 1 gram Q6 until 12/28/21"

## 2022-02-18 NOTE — Assessment & Plan Note (Addendum)
Generalized weakness with ambulatory dysfunction History of MSSA bacteremia in September 2023 with negative TEE History of fluid collection L5-S1 on MRI September 23 History of mediastinal mass SIRS criteria include low-grade fever and tachycardia but no septic source identified UA, CXR and abdominal CT negative for nonrevealing Could be related to dehydration but infection not ruled out Patient has difficulty walking and had a prior MRI lumbar spine 11/2021 that was concerning for septic arthritis "there are two small soft tissue fluid collections arising from the bilateral L5-S1 facet joints with surrounding soft tissue edema" Will get procalcitonin, lactic acid and sed rate to evaluate for bacterial infection and consider repeating MRI lumbar spine Follow blood cultures Consider further workup of mediastinal mass

## 2022-02-18 NOTE — ED Notes (Signed)
RN and EDT unable to obtain blood draw. Lab called

## 2022-02-18 NOTE — Assessment & Plan Note (Addendum)
Followed by oncology seen 10/2021.  Was supposed to get a PET scan but apparently lost to follow-up CT scan results from October 01, 2021  with posterior mediastinal masses  Consider repeat CT chest vs oncology inpatient vs outpatient referral

## 2022-02-18 NOTE — Assessment & Plan Note (Signed)
Received oral repletion in the ED Continue to monitor

## 2022-02-18 NOTE — ED Triage Notes (Signed)
Pt to ED via POV from home. Pt reports generalized weakness that started yesterday and is now unable to walk. Pt also endorses SOB. Pt reports multiple admissions for same and sepsis.

## 2022-02-18 NOTE — H&P (Signed)
History and Physical    Patient: John Booker WIO:[id number] DOB: 07-15-1950 DOA: 02/18/2022 DOS: the patient was seen and examined on 02/18/2022 PCP: Dion Body, MD  Patient coming from: Home  Chief Complaint:  Chief Complaint  Patient presents with   Weakness    HPI: John Booker is a 71 y.o. male with medical history significant for Diastolic CHF (EF 60 to 42%, G1 DD, 11/2021), HTN, intractable hiccups followed by GI, mediastinal mass followed by oncology last seen 10/2021, hospitalized from 9/9 to 9/13 with traumatic rhabdomyolysis with workup subsequently showing MSSA bacteremia with negative TEE who presents to the ED with a several day history of generalized weakness with inability to ambulate.  He denies fever or chills, nausea or vomiting, cough, shortness of breath or chest pain.  He feels similar to how he felt when he was hospitalized in September.  Of note patient has bacteremia with believed related to skin wounds which have subsequently healed however there was finding of possible areas of fluid collection on the MRI lumbar spine.  He followed up with ID and completed his 6 weeks of antibiotics. ED course and data review: Tmax 99.2 with pulse 109-113 with BP 123/64 and O2 sat 99% on room air.  CBC WNL.  BMP significant for potassium of 2.9 and creatinine of 1.56 up from baseline of 0.82 about 3 months prior.  Urinalysis unremarkable COVID and flu negative.  CK 66. EKG, personally viewed and interpreted shows sinus tachycardia at 113 with nonspecific ST-T wave changes. CT abdomen and pelvis showed no acute findings, just bilateral nephrolithiasis without hydronephrosis.  Chest x-ray showed no active cardiopulmonary disease Patient was given oral potassium 40 mEq and a 2 L LR bolus.  Hospitalist consulted for admission.   Review of Systems: As mentioned in the history of present illness. All other systems reviewed and are negative.  Past Medical History:  Diagnosis Date    Hypertension    Past Surgical History:  Procedure Laterality Date   ESOPHAGOGASTRODUODENOSCOPY (EGD) WITH PROPOFOL N/A 05/26/2018   Procedure: ESOPHAGOGASTRODUODENOSCOPY (EGD) WITH PROPOFOL;  Surgeon: Virgel Manifold, MD;  Location: [facility] ENDOSCOPY;  Service: Endoscopy;  Laterality: N/A;   ESOPHAGOGASTRODUODENOSCOPY (EGD) WITH PROPOFOL N/A 12/28/2020   Procedure: ESOPHAGOGASTRODUODENOSCOPY (EGD) WITH PROPOFOL;  Surgeon: Annamaria Helling, DO;  Location: [facility] ENDOSCOPY;  Service: Gastroenterology;  Laterality: N/A;   ESOPHAGOGASTRODUODENOSCOPY (EGD) WITH PROPOFOL N/A 10/09/2021   Procedure: ESOPHAGOGASTRODUODENOSCOPY (EGD) WITH PROPOFOL;  Surgeon: [name], MD;  Location: [facility] ENDOSCOPY;  Service: Gastroenterology;  Laterality: N/A;   INCISION AND DRAINAGE PERIRECTAL ABSCESS N/A 11/19/2015   Procedure: IRRIGATION AND DEBRIDEMENT PERIRECTAL ABSCESS and debridement of scrotal abscess;  Surgeon: Florene Glen, MD;  Location: [facility] ORS;  Service: General;  Laterality: N/A;   INCISION AND DRAINAGE PERIRECTAL ABSCESS N/A 11/20/2015   Procedure: IRRIGATION AND DEBRIDEMENT PERIRECTAL ABSCESS / WITH DRESSING CHANGE;  Surgeon: Florene Glen, MD;  Location: [facility] ORS;  Service: General;  Laterality: N/A;  peri rectal site   INCISION AND DRAINAGE PERIRECTAL ABSCESS N/A 11/21/2015   Procedure: IRRIGATION AND DEBRIDEMENT PERIRECTAL ABSCESS WITH DRESSING CHANGE;  Surgeon: Florene Glen, MD;  Location: [facility] ORS;  Service: General;  Laterality: N/A;   MINOR HEMORRHOIDECTOMY  1989   TEE WITHOUT CARDIOVERSION N/A 11/20/2021   Procedure: TRANSESOPHAGEAL ECHOCARDIOGRAM (TEE);  Surgeon: Corey Skains, MD;  Location: [facility] ORS;  Service: Cardiovascular;  Laterality: N/A;  8:00am   Social History:  reports that he has quit smoking. His smoking use  included cigarettes. He has a 19.00 pack-year smoking history. He has quit using smokeless tobacco. He reports that he does not currently use alcohol. He  reports that he does not use drugs.  Allergies  Allergen Reactions   Adalat [Nifedipine]     Foot swelling.    Amlodipine Other (See Comments)    Leg swelling    Family History  Problem Relation Age of Onset   Hypertension Father     Prior to Admission medications   Medication Sig Start Date End Date Taking? Authorizing Provider  acetaminophen (TYLENOL) 325 MG tablet Take 2 tablets (650 mg total) by mouth every 6 (six) hours as needed for mild pain or fever. 06/27/21   Lorella Nimrod, MD  aspirin EC 81 MG EC tablet Take 1 tablet (81 mg total) by mouth daily. Swallow whole. 06/27/21   Lorella Nimrod, MD  escitalopram (LEXAPRO) 5 MG tablet Take 5 mg by mouth daily. 02/07/22   [provider]  feeding supplement (ENSURE ENLIVE / ENSURE PLUS) LIQD Take 237 mLs by mouth 3 (three) times daily between meals. 12/31/20   Ezekiel Slocumb, DO  furosemide (LASIX) 40 MG tablet Take 40 mg by mouth daily. 11/07/21   [provider]  gabapentin (NEURONTIN) 100 MG capsule Take 1 capsule (100 mg total) by mouth 3 (three) times daily. 02/17/22 06/17/22  [name], MD  losartan (COZAAR) 100 MG tablet Take 1 tablet (100 mg total) by mouth daily. 05/13/21   Karamalegos, Devonne Doughty, DO  Multiple Vitamin (MULTIVITAMIN WITH MINERALS) TABS tablet Take 1 tablet by mouth daily. 12/31/20   Nicole Kindred A, DO  ondansetron (ZOFRAN) 4 MG tablet Take 1 tablet (4 mg total) by mouth every 8 (eight) hours as needed for nausea or vomiting. 02/05/22   [name], MD  pantoprazole (PROTONIX) 40 MG tablet Take 1 tablet (40 mg total) by mouth 2 (two) times daily. 02/04/22 08/03/22  [name], MD  potassium chloride SA (KLOR-CON M) 20 MEQ tablet Take 2 tablets (40 mEq total) by mouth daily for 3 days. 11/21/21 11/24/21  Shelly Coss, MD  sildenafil (REVATIO) 20 MG tablet Take 1-5 pills about 30 min prior to sex. Start with 1 and increase as needed. 12/20/19   Karamalegos, Devonne Doughty, DO  vitamin B-12 1000 MCG  tablet Take 1 tablet (1,000 mcg total) by mouth daily. 12/31/20   Ezekiel Slocumb, DO    Physical Exam: Vitals:   02/18/22 1131 02/18/22 1531 02/18/22 1614 02/18/22 1743  BP: 123/64 100/61    Pulse: (!) 113 (!) 109    Resp: 18 18    Temp: (!) 97.5 F (36.4 C)  99.2 F (37.3 C)   TempSrc: Oral  Oral   SpO2: 99% 100%    Weight:    104.3 kg  Height:    _0  (1.905 m)   Physical Exam  Labs on Admission: I have personally reviewed following labs and imaging studies  CBC: Recent Labs  Lab 02/18/22 1154  WBC 9.9  HGB 13.2  HCT 39.2  MCV 92.5  PLT 357   Basic Metabolic Panel: Recent Labs  Lab 02/18/22 1154  NA 132*  K 2.9*  CL 90*  CO2 28  GLUCOSE 112*  BUN 23  CREATININE 1.56*  CALCIUM 9.1   GFR: Estimated Creatinine Clearance: 55.1 mL/min (A) (by C-G formula based on SCr of 1.56 mg/dL (H)). Liver Function Tests: No results for input(s): "AST", "ALT", "ALKPHOS", "BILITOT", "PROT", "ALBUMIN" in the last 168  hours. No results for input(s): "LIPASE", "AMYLASE" in the last 168 hours. No results for input(s): "AMMONIA" in the last 168 hours. Coagulation Profile: No results for input(s): "INR", "PROTIME" in the last 168 hours. Cardiac Enzymes: Recent Labs  Lab 02/18/22 1154  CKTOTAL 66   BNP (last 3 results) No results for input(s): "PROBNP" in the last 8760 hours. HbA1C: No results for input(s): "HGBA1C" in the last 72 hours. CBG: No results for input(s): "GLUCAP" in the last 168 hours. Lipid Profile: No results for input(s): "CHOL", "HDL", "LDLCALC", "TRIG", "CHOLHDL", "LDLDIRECT" in the last 72 hours. Thyroid Function Tests: No results for input(s): "TSH", "T4TOTAL", "FREET4", "T3FREE", "THYROIDAB" in the last 72 hours. Anemia Panel: No results for input(s): "VITAMINB12", "FOLATE", "FERRITIN", "TIBC", "IRON", "RETICCTPCT" in the last 72 hours. Urine analysis:    Component Value Date/Time   COLORURINE AMBER (A) 02/18/2022 1747   APPEARANCEUR HAZY (A)  02/18/2022 1747   APPEARANCEUR Clear 10/29/2021 1034   LABSPEC 1.025 02/18/2022 1747   PHURINE 5.0 02/18/2022 1747   GLUCOSEU NEGATIVE 02/18/2022 1747   HGBUR SMALL (A) 02/18/2022 1747   BILIRUBINUR NEGATIVE 02/18/2022 1747   BILIRUBINUR Negative 10/29/2021 1034   KETONESUR 5 (A) 02/18/2022 1747   PROTEINUR 30 (A) 02/18/2022 1747   NITRITE NEGATIVE 02/18/2022 1747   LEUKOCYTESUR NEGATIVE 02/18/2022 1747    Radiological Exams on Admission: CT ABDOMEN PELVIS W CONTRAST  Result Date: 02/18/2022 CLINICAL DATA:  Abdominal pain, history of spinal abscess EXAM: CT ABDOMEN AND PELVIS WITH CONTRAST TECHNIQUE: Multidetector CT imaging of the abdomen and pelvis was performed using the standard protocol following bolus administration of intravenous contrast. RADIATION DOSE REDUCTION: This exam was performed according to the departmental dose-optimization program which includes automated exposure control, adjustment of the mA and/or kV according to patient size and/or use of iterative reconstruction technique. CONTRAST:  172m OMNIPAQUE IOHEXOL 300 MG/ML  SOLN COMPARISON:  PET-CT 11/12/2021 and previous FINDINGS: Lower chest: No pleural or pericardial effusion. Visualized lung bases clear. Hepatobiliary: Stable scattered small low-attenuation lesions largest 1.2 cm in segment 2 possibly cysts; no follow-up recommended. No calcified gallstones. The gallbladder is nondilated. No biliary ductal dilatation. Pancreas: A few punctate calcifications in the uncinate process of the pancreas, stable since 04/15/2012. No mass, ductal dilatation, or adjacent inflammatory change. Spleen: Normal in size without focal abnormality. Adrenals/Urinary Tract: No adrenal mass. 4 mm right upper pole renal calculus. 3 mm right mid calculus. 2 mm left lower pole calculus. No hydronephrosis. Urinary bladder incompletely distended. Stomach/Bowel: Stomach is partially distended, unremarkable. Small bowel decompressed. Normal appendix.  The colon is incompletely distended, unremarkable. Vascular/Lymphatic: Scattered aortoiliac calcified atheromatous plaque without aneurysm or evident stenosis. No abdominal or pelvic adenopathy. Portal vein patent. Reproductive: Prostate enlargement with central coarse calcifications, protruding into the lumen of the urinary bladder. Other: Right pelvic phleboliths.  No ascites.  No free air. Musculoskeletal: Bilateral hip DJD. Bridging anterior osteophytes from the visualized lower thoracic spine extending through L4. No fracture or acute abnormality. IMPRESSION: 1. No acute findings. 2. Bilateral nephrolithiasis without hydronephrosis. Aortic Atherosclerosis (ICD10-I70.0). Electronically Signed   By: DLucrezia EuropeM.D.   On: 02/18/2022 16:51   DG Chest 2 View  Result Date: 02/18/2022 CLINICAL DATA:  Shortness of breath EXAM: CHEST - 2 VIEW COMPARISON:  11/15/2021 FINDINGS: The heart size and mediastinal contours are within normal limits. No focal airspace consolidation, pleural effusion, or pneumothorax. Chronic findings of diffuse idiopathic skeletal hyperostosis. IMPRESSION: No active cardiopulmonary disease. Electronically Signed   By: NHart Carwin  Plundo D.O.   On: 02/18/2022 12:11     Data Reviewed: Relevant notes from primary care and specialist visits, past discharge summaries as available in EHR, including Care Everywhere. Prior diagnostic testing as pertinent to current admission diagnoses Updated medications and problem lists for reconciliation ED course, including vitals, labs, imaging, treatment and response to treatment Triage notes, nursing and pharmacy notes and ED provider's notes Notable results as noted in HPI   Assessment and Plan: * SIRS (systemic inflammatory response syndrome) (HCC) Generalized weakness with ambulatory dysfunction History of MSSA bacteremia in September 2023 with negative TEE History of fluid collection L5-S1 on MRI September 23 History of mediastinal  mass SIRS criteria include low-grade fever and tachycardia but no septic source identified UA, CXR and abdominal CT negative for nonrevealing Could be related to dehydration but infection not ruled out Patient has difficulty walking and had a prior MRI lumbar spine 11/2021 that was concerning for septic arthritis "there are two small soft tissue fluid collections arising from the bilateral L5-S1 facet joints with surrounding soft tissue edema" Will get procalcitonin, lactic acid and sed rate to evaluate for bacterial infection and consider repeating MRI lumbar spine Follow blood cultures Consider further workup of mediastinal mass   AKI (acute kidney injury) ([place]) Creatinine 1.56 up from 0.82 IV hydration and monitor renal function   Hypokalemia Received oral repletion in the ED Continue to monitor  History of bacteremia secondary to foot wounds, s/p treatment 9/13 - 12/28/21 Per ID note in Sept  "MSSA bacteremia- source likely was the toe wounds TEE neg for endocarditis MRI lumbar spine showed small collections B/l facet joint area l5-S1 suggestive of septic arthritis esepcailly with bacteremia, pain and high esr Pt completed 3 weeks of IV cefazolin and left SNF and did not want tot ake any more IV- so he is on PO Keflex 1 gram Q6 until 12/28/21"  Mediastinal mass Followed by oncology seen 10/2021.  Was supposed to get a PET scan but apparently lost to follow-up CT scan results from October 01, 2021  with posterior mediastinal masses  Consider repeat CT chest vs oncology inpatient vs outpatient referral   Essential hypertension BP borderline low Will hold losartan and furosemide tonight pending hydration  Intractable hiccups Followed by GI  Chronic diastolic CHF (congestive heart failure) ([place]) Echo 11/18/2021 showed EF 60 to 65% and G1 DD Holding furosemide and losartan overnight due to borderline blood pressure and AKI Daily weights and monitor for overload in view of IV  hydration and treatment of AKI      DVT prophylaxis: Lovenox  Consults: none  Advance Care Planning:   Code Status: Prior   Family Communication: none  Disposition Plan: Back to previous home environment  Severity of Illness: The appropriate patient status for this patient is OBSERVATION. Observation status is judged to be reasonable and necessary in order to provide the required intensity of service to ensure the patient's safety. The patient's presenting symptoms, physical exam findings, and initial radiographic and laboratory data in the context of their medical condition is felt to place them at decreased risk for further clinical deterioration. Furthermore, it is anticipated that the patient will be medically stable for discharge from the hospital within 2 midnights of admission.   Author: Athena Masse, MD 02/18/2022 8:01 PM  For on call review [email or web address].

## 2022-02-18 NOTE — Assessment & Plan Note (Addendum)
Creatinine 1.56 up from 0.82 IV hydration and monitor renal function

## 2022-02-18 NOTE — Assessment & Plan Note (Signed)
BP borderline low Will hold losartan and furosemide tonight pending hydration

## 2022-02-18 NOTE — ED Notes (Signed)
This RN attempted in & out cath and was unsuccessful. Pt c/o of severe pain and refused another attempt

## 2022-02-19 DIAGNOSIS — N179 Acute kidney failure, unspecified: Secondary | ICD-10-CM

## 2022-02-19 DIAGNOSIS — I5032 Chronic diastolic (congestive) heart failure: Secondary | ICD-10-CM | POA: Diagnosis not present

## 2022-02-19 DIAGNOSIS — E876 Hypokalemia: Secondary | ICD-10-CM

## 2022-02-19 DIAGNOSIS — I10 Essential (primary) hypertension: Secondary | ICD-10-CM | POA: Diagnosis not present

## 2022-02-19 DIAGNOSIS — R6510 Systemic inflammatory response syndrome (SIRS) of non-infectious origin without acute organ dysfunction: Secondary | ICD-10-CM | POA: Diagnosis not present

## 2022-02-19 LAB — POTASSIUM: Potassium: 3.8 mmol/L (ref 3.5–5.1)

## 2022-02-19 LAB — PROCALCITONIN: Procalcitonin: 3 ng/mL

## 2022-02-19 LAB — BASIC METABOLIC PANEL
Anion gap: 8 (ref 5–15)
BUN: 19 mg/dL (ref 8–23)
CO2: 28 mmol/L (ref 22–32)
Calcium: 8.4 mg/dL — ABNORMAL LOW (ref 8.9–10.3)
Chloride: 98 mmol/L (ref 98–111)
Creatinine, Ser: 1.15 mg/dL (ref 0.61–1.24)
GFR, Estimated: >60 mL/min (ref >60)
Glucose, Bld: 118 mg/dL — ABNORMAL HIGH (ref 70–99)
Potassium: 2.9 mmol/L — ABNORMAL LOW (ref 3.5–5.1)
Sodium: 134 mmol/L — ABNORMAL LOW (ref 135–145)

## 2022-02-19 LAB — CBC
HCT: 36.4 % — ABNORMAL LOW (ref 39.0–52.0)
Hemoglobin: 12.2 g/dL — ABNORMAL LOW (ref 13.0–17.0)
MCH: 31.2 pg (ref 26.0–34.0)
MCHC: 33.5 g/dL (ref 30.0–36.0)
MCV: 93.1 fL (ref 80.0–100.0)
Platelets: 241 10*3/uL (ref 150–400)
RBC: 3.91 MIL/uL — ABNORMAL LOW (ref 4.22–5.81)
RDW: 13.7 % (ref 11.5–15.5)
WBC: 8.1 10*3/uL (ref 4.0–10.5)
nRBC: 0 % (ref 0.0–0.2)

## 2022-02-19 LAB — MAGNESIUM: Magnesium: 1.3 mg/dL — ABNORMAL LOW (ref 1.7–2.4)

## 2022-02-19 MED ORDER — ADULT MULTIVITAMIN W/MINERALS CH
1.0000 | ORAL_TABLET | Freq: Every day | ORAL | Status: DC
  Filled 2022-02-19: qty 1

## 2022-02-19 MED ORDER — LOSARTAN POTASSIUM 50 MG PO TABS
100.0000 mg | ORAL_TABLET | Freq: Every day | ORAL | Status: DC
  Administered 2022-02-19: 100 mg via ORAL
  Filled 2022-02-19: qty 2

## 2022-02-19 MED ORDER — MAGNESIUM SULFATE 4 GM/100ML IV SOLN
4.0000 g | Freq: Once | INTRAVENOUS | Status: AC
  Administered 2022-02-19: 4 g via INTRAVENOUS
  Filled 2022-02-19: qty 100

## 2022-02-19 MED ORDER — MAGNESIUM 200 MG PO CHEW: 200.0000 mg | CHEWABLE_TABLET | Freq: Every day | ORAL | 0 refills | Status: DC

## 2022-02-19 MED ORDER — GABAPENTIN 100 MG PO CAPS: 100.0000 mg | ORAL_CAPSULE | Freq: Three times a day (TID) | ORAL | Status: DC

## 2022-02-19 MED ORDER — ESCITALOPRAM OXALATE 10 MG PO TABS
5.0000 mg | ORAL_TABLET | Freq: Every day | ORAL | Status: DC
  Administered 2022-02-19: 5 mg via ORAL
  Filled 2022-02-19: qty 1

## 2022-02-19 MED ORDER — POTASSIUM CHLORIDE 20 MEQ PO PACK
40.0000 meq | PACK | ORAL | Status: AC
  Administered 2022-02-19 (×2): 40 meq via ORAL
  Filled 2022-02-19 (×2): qty 2

## 2022-02-19 MED ORDER — POTASSIUM CHLORIDE CRYS ER 20 MEQ PO TBCR
40.0000 meq | EXTENDED_RELEASE_TABLET | ORAL | Status: DC
  Filled 2022-02-19: qty 2

## 2022-02-19 MED ORDER — POTASSIUM CHLORIDE CRYS ER 20 MEQ PO TBCR: 20.0000 meq | EXTENDED_RELEASE_TABLET | Freq: Every day | ORAL | 0 refills | Status: DC

## 2022-02-19 MED ORDER — SALINE SPRAY 0.65 % NA SOLN: 1.0000 | NASAL | 0 refills | Status: DC | PRN

## 2022-02-19 NOTE — Hospital Course (Addendum)
Taken from H&P.  John Booker is a 71 y.o. male with medical history significant for Diastolic CHF (EF 60 to 92%, G1 DD, 11/2021), HTN, intractable hiccups followed by GI, mediastinal mass followed by oncology last seen 10/2021, hospitalized from 9/9 to 9/13 with traumatic rhabdomyolysis with workup subsequently showing MSSA bacteremia and negative TEE who presents to the ED with a several day history of generalized weakness with inability to ambulate.  He denies fever or chills, nausea or vomiting, cough, shortness of breath or chest pain.  For his prior bacteremia he followed up with ID and completed his 6 weeks of antibiotics.  He denies back pain or saddle anesthesia ED course and data review: Tmax 99.2 with pulse 109-113 with BP 123/64 and O2 sat 99% on room air.  CBC WNL.  BMP significant for potassium of 2.9 and creatinine of 1.56 up from baseline of 0.82 about 3 months prior.  Urinalysis unremarkable COVID and flu negative.  CK 66. EKG, shows sinus tachycardia at 113 with nonspecific ST-T wave changes. CT abdomen and pelvis showed no acute findings, just bilateral nephrolithiasis without hydronephrosis.  Chest x-ray showed no active cardiopulmonary disease Patient was given oral potassium 40 mEq and a 2 L LR bolus.   12/13: Hemodynamically stable, procalcitonin at 3.  CBC is stable, potassium remains at 2.9, sodium 134, ESR elevated at 60.  Patient has an history of mediastinal masses and PET scan done in September was negative for any concern of malignancies.  They were thought to be due to extramedullary hematopoiesis.  Patient need to follow-up with his oncologist for further workup if needed. Potassium improved to 3.8 with supplement.  Patient also received magnesium supplement for hypomagnesemia.  He was given potassium and magnesium supplement to take at home.  He was instructed to make his home Lasix as needed for lower extremity edema and take potassium supplement with it whenever he takes  Lasix.  Patient appears dehydrated and improved with IV fluid.  Tachycardia resolved with IV fluid. He was also instructed to follow-up with his cardiologist and primary care doctor for further recommendations.  Patient will continue with current medications and follow-up with his providers for further recommendations.

## 2022-02-19 NOTE — ED Notes (Signed)
Pt ambulate to restroom using walker. Pt ambulates with steady gait

## 2022-02-19 NOTE — Discharge Summary (Signed)
Physician Discharge Summary   Patient: John Booker MRN: [id number] DOB: 1951-02-01  Admit date:     02/18/2022  Discharge date: 02/19/22  Discharge Physician: Lorella Nimrod   PCP: Dion Body, MD   Recommendations at discharge:  Please obtain CBC and BMP in 1 week Repeat ESR and procalcitonin-if remained elevated please follow-up with infectious disease. Follow-up with primary care provider within a week  Discharge Diagnoses: Principal Problem:   SIRS (systemic inflammatory response syndrome) (HCC) Active Problems:   Generalized weakness   AKI (acute kidney injury) ([place])   Hypokalemia   History of bacteremia secondary to foot wounds, s/p treatment 9/13 - 12/28/21   Mediastinal mass   Essential hypertension   Intractable hiccups   Chronic diastolic CHF (congestive heart failure) [facility])   Hospital Course: Taken from H&P.  John Booker is a 71 y.o. male with medical history significant for Diastolic CHF (EF 60 to 65%, G1 DD, 11/2021), HTN, intractable hiccups followed by GI, mediastinal mass followed by oncology last seen 10/2021, hospitalized from 9/9 to 9/13 with traumatic rhabdomyolysis with workup subsequently showing MSSA bacteremia and negative TEE who presents to the ED with a several day history of generalized weakness with inability to ambulate.  He denies fever or chills, nausea or vomiting, cough, shortness of breath or chest pain.  For his prior bacteremia he followed up with ID and completed his 6 weeks of antibiotics.  He denies back pain or saddle anesthesia ED course and data review: Tmax 99.2 with pulse 109-113 with BP 123/64 and O2 sat 99% on room air.  CBC WNL.  BMP significant for potassium of 2.9 and creatinine of 1.56 up from baseline of 0.82 about 3 months prior.  Urinalysis unremarkable COVID and flu negative.  CK 66. EKG, shows sinus tachycardia at 113 with nonspecific ST-T wave changes. CT abdomen and pelvis showed no acute findings, just bilateral  nephrolithiasis without hydronephrosis.  Chest x-ray showed no active cardiopulmonary disease Patient was given oral potassium 40 mEq and a 2 L LR bolus.   12/13: Hemodynamically stable, procalcitonin at 3.  CBC is stable, potassium remains at 2.9, sodium 134, ESR elevated at 60.  Patient has an history of mediastinal masses and PET scan done in September was negative for any concern of malignancies.  They were thought to be due to extramedullary hematopoiesis.  Patient need to follow-up with his oncologist for further workup if needed. Potassium improved to 3.8 with supplement.  Patient also received magnesium supplement for hypomagnesemia.  He was given potassium and magnesium supplement to take at home.  He was instructed to make his home Lasix as needed for lower extremity edema and take potassium supplement with it whenever he takes Lasix.  Patient appears dehydrated and improved with IV fluid.  Tachycardia resolved with IV fluid. He was also instructed to follow-up with his cardiologist and primary care doctor for further recommendations.  Patient will continue with current medications and follow-up with his providers for further recommendations.   Assessment and Plan: * SIRS (systemic inflammatory response syndrome) (HCC) Generalized weakness with ambulatory dysfunction History of MSSA bacteremia in September 2023 with negative TEE History of fluid collection L5-S1 on MRI September 23 History of mediastinal mass SIRS criteria include low-grade fever and tachycardia but no septic source identified UA, CXR and abdominal CT negative for nonrevealing Could be related to dehydration but infection not ruled out Patient has difficulty walking and had a prior MRI lumbar spine 11/2021 that was concerning for  septic arthritis "there are two small soft tissue fluid collections arising from the bilateral L5-S1 facet joints with surrounding soft tissue edema" Will get procalcitonin, lactic acid and sed  rate to evaluate for bacterial infection and consider repeating MRI lumbar spine Follow blood cultures Consider further workup of mediastinal mass   AKI (acute kidney injury) ([place]) Creatinine 1.56 up from 0.82 IV hydration and monitor renal function   Hypokalemia Received oral repletion in the ED Continue to monitor  History of bacteremia secondary to foot wounds, s/p treatment 9/13 - 12/28/21 Per ID note in Sept  "MSSA bacteremia- source likely was the toe wounds TEE neg for endocarditis MRI lumbar spine showed small collections B/l facet joint area l5-S1 suggestive of septic arthritis esepcailly with bacteremia, pain and high esr Pt completed 3 weeks of IV cefazolin and left SNF and did not want tot ake any more IV- so he is on PO Keflex 1 gram Q6 until 12/28/21"  Mediastinal mass Followed by oncology seen 10/2021.  Was supposed to get a PET scan but apparently lost to follow-up CT scan results from October 01, 2021  with posterior mediastinal masses  Consider repeat CT chest vs oncology inpatient vs outpatient referral   Essential hypertension BP borderline low Will hold losartan and furosemide tonight pending hydration  Intractable hiccups Followed by GI  Chronic diastolic CHF (congestive heart failure) ([place]) Echo 11/18/2021 showed EF 60 to 65% and G1 DD Holding furosemide and losartan overnight due to borderline blood pressure and AKI Daily weights and monitor for overload in view of IV hydration and treatment of AKI   Consultants: None Procedures performed: None Disposition: Home Diet recommendation:  Discharge Diet Orders (From admission, onward)     Start     Ordered   02/19/22 0000  Diet - low sodium heart healthy        02/19/22 1438           Cardiac diet DISCHARGE MEDICATION: Allergies as of 02/19/2022       Reactions   Adalat [nifedipine]    Foot swelling.    Amlodipine Other (See Comments)   Leg swelling        Medication List     TAKE  these medications    acetaminophen 325 MG tablet Commonly known as: TYLENOL Take 2 tablets (650 mg total) by mouth every 6 (six) hours as needed for mild pain or fever.   aspirin EC 81 MG tablet Take 1 tablet (81 mg total) by mouth daily. Swallow whole.   baclofen 10 MG tablet Commonly known as: LIORESAL Take 10 mg by mouth 2 (two) times daily.   cyanocobalamin 1000 MCG tablet Take 1 tablet (1,000 mcg total) by mouth daily.   escitalopram 5 MG tablet Commonly known as: LEXAPRO Take 5 mg by mouth daily.   feeding supplement Liqd Take 237 mLs by mouth 3 (three) times daily between meals.   furosemide 40 MG tablet Commonly known as: LASIX Take 40 mg by mouth daily.   gabapentin 100 MG capsule Commonly known as: Neurontin Take 1 capsule (100 mg total) by mouth 3 (three) times daily.   losartan 100 MG tablet Commonly known as: COZAAR Take 1 tablet (100 mg total) by mouth daily.   Magnesium 200 MG Chew Chew 200 mg by mouth daily.   multivitamin with minerals Tabs tablet Take 1 tablet by mouth daily.   ondansetron 4 MG tablet Commonly known as: ZOFRAN Take 1 tablet (4 mg total) by mouth every 8 (  eight) hours as needed for nausea or vomiting.   pantoprazole 40 MG tablet Commonly known as: PROTONIX Take 1 tablet (40 mg total) by mouth 2 (two) times daily.   potassium chloride SA 20 MEQ tablet Commonly known as: KLOR-CON M Take 1 tablet (20 mEq total) by mouth daily. What changed: how much to take   sildenafil 20 MG tablet Commonly known as: REVATIO Take 1-5 pills about 30 min prior to sex. Start with 1 and increase as needed.   sodium chloride 0.65 % Soln nasal spray Commonly known as: OCEAN Place 1 spray into both nostrils as needed for congestion (nose irritation).        Follow-up Information     Dion Body, MD. Schedule an appointment as soon as possible for a visit in 1 week(s).   Specialty: Family Medicine Contact information: [place] [id number] [phone]                Discharge Exam: Danley Danker Weights   02/18/22 1743  Weight: 104.3 kg   General.     In no acute distress. Pulmonary.  Lungs clear bilaterally, normal respiratory effort. CV.  Regular rate and rhythm, no JVD, rub or murmur. Abdomen.  Soft, nontender, nondistended, BS positive. CNS.  Alert and oriented .  No focal neurologic deficit. Extremities.  No edema, no cyanosis, pulses intact and symmetrical. Psychiatry.  Judgment and insight appears normal.   Condition at discharge: stable  The results of significant diagnostics from this hospitalization (including imaging, microbiology, ancillary and laboratory) are listed below for reference.   Imaging Studies: CT ABDOMEN PELVIS W CONTRAST  Result Date: 02/18/2022 CLINICAL DATA:  Abdominal pain, history of spinal abscess EXAM: CT ABDOMEN AND PELVIS WITH CONTRAST TECHNIQUE: Multidetector CT imaging of the abdomen and pelvis was performed using the standard protocol following bolus administration of intravenous contrast. RADIATION DOSE REDUCTION: This exam was performed according to the departmental dose-optimization program which includes automated exposure control, adjustment of the mA and/or kV according to patient size and/or use of iterative reconstruction technique. CONTRAST:  153m OMNIPAQUE IOHEXOL 300 MG/ML  SOLN COMPARISON:  PET-CT 11/12/2021 and previous FINDINGS: Lower chest: No pleural or pericardial effusion. Visualized lung bases clear. Hepatobiliary: Stable scattered small low-attenuation lesions largest 1.2 cm in segment 2 possibly cysts; no follow-up recommended. No calcified gallstones. The gallbladder is nondilated. No biliary ductal dilatation. Pancreas: A few punctate calcifications in the uncinate process of the pancreas, stable since 04/15/2012. No mass, ductal dilatation, or adjacent inflammatory change. Spleen: Normal in size without focal  abnormality. Adrenals/Urinary Tract: No adrenal mass. 4 mm right upper pole renal calculus. 3 mm right mid calculus. 2 mm left lower pole calculus. No hydronephrosis. Urinary bladder incompletely distended. Stomach/Bowel: Stomach is partially distended, unremarkable. Small bowel decompressed. Normal appendix. The colon is incompletely distended, unremarkable. Vascular/Lymphatic: Scattered aortoiliac calcified atheromatous plaque without aneurysm or evident stenosis. No abdominal or pelvic adenopathy. Portal vein patent. Reproductive: Prostate enlargement with central coarse calcifications, protruding into the lumen of the urinary bladder. Other: Right pelvic phleboliths.  No ascites.  No free air. Musculoskeletal: Bilateral hip DJD. Bridging anterior osteophytes from the visualized lower thoracic spine extending through L4. No fracture or acute abnormality. IMPRESSION: 1. No acute findings. 2. Bilateral nephrolithiasis without hydronephrosis. Aortic Atherosclerosis (ICD10-I70.0). Electronically Signed   By: DLucrezia EuropeM.D.   On: 02/18/2022 16:51   DG Chest 2 View  Result Date: 02/18/2022 CLINICAL DATA:  Shortness of breath  EXAM: CHEST - 2 VIEW COMPARISON:  11/15/2021 FINDINGS: The heart size and mediastinal contours are within normal limits. No focal airspace consolidation, pleural effusion, or pneumothorax. Chronic findings of diffuse idiopathic skeletal hyperostosis. IMPRESSION: No active cardiopulmonary disease. Electronically Signed   By: Davina Poke D.O.   On: 02/18/2022 12:11   MR BRAIN W WO CONTRAST  Result Date: 02/14/2022 CLINICAL DATA:  Hiccups. EXAM: MRI HEAD WITHOUT AND WITH CONTRAST TECHNIQUE: Multiplanar, multiecho pulse sequences of the brain and surrounding structures were obtained without and with intravenous contrast. CONTRAST:  8m GADAVIST GADOBUTROL 1 MMOL/ML IV SOLN COMPARISON:  Head CT 11/15/2021 and MRI 12/29/2020 FINDINGS: Brain: There is no evidence of an acute infarct,  intracranial hemorrhage, mass, midline shift, or extra-axial fluid collection. Mild cerebral atrophy is unchanged from the prior MRI. Periventricular white matter T2 hyperintensities are also unchanged and nonspecific but compatible with minimal chronic small vessel ischemic disease which is not greater than expected for age. No abnormal enhancement is identified. Vascular: Major intracranial vascular flow voids are preserved. Skull and upper cervical spine: Unremarkable bone marrow signal. Sinuses/Orbits: Unremarkable orbits. Paranasal sinuses and mastoid air cells are clear. Other: None. IMPRESSION: 1. No acute intracranial abnormality or mass. 2. Mild cerebral atrophy. Electronically Signed   By: ALogan BoresM.D.   On: 02/14/2022 11:33    Microbiology: Results for orders placed or performed during the hospital encounter of 02/18/22  Resp panel by RT-PCR (RSV, Flu A&B, Covid) Anterior Nasal Swab     Status: None   Collection Time: 02/18/22  5:47 PM   Specimen: Anterior Nasal Swab  Result Value Ref Range Status   SARS Coronavirus 2 by RT PCR NEGATIVE NEGATIVE Final    Comment: (NOTE) SARS-CoV-2 target nucleic acids are NOT DETECTED.  The SARS-CoV-2 RNA is generally detectable in upper respiratory specimens during the acute phase of infection. The lowest concentration of SARS-CoV-2 viral copies this assay can detect is 138 copies/mL. A negative result does not preclude SARS-Cov-2 infection and should not be used as the sole basis for treatment or other patient management decisions. A negative result may occur with  improper specimen collection/handling, submission of specimen other than nasopharyngeal swab, presence of viral mutation(s) within the areas targeted by this assay, and inadequate number of viral copies(<138 copies/mL). A negative result must be combined with clinical observations, patient history, and epidemiological information. The expected result is Negative.  Fact Sheet for  Patients:  hEntrepreneurPulse.com.au Fact Sheet for Healthcare Providers:  hIncredibleEmployment.be This test is no t yet approved or cleared by the [place] and  has been authorized for detection and/or diagnosis of SARS-CoV-2 by FDA under an Emergency Use Authorization (EUA). This EUA will remain  in effect (meaning this test can be used) for the duration of the COVID-19 declaration under Section 564(b)(1) of the Act, 21 U.S.C.section 360bbb-3(b)(1), unless the authorization is terminated  or revoked sooner.       Influenza A by PCR NEGATIVE NEGATIVE Final   Influenza B by PCR NEGATIVE NEGATIVE Final    Comment: (NOTE) The Xpert Xpress SARS-CoV-2/FLU/RSV plus assay is intended as an aid in the diagnosis of influenza from Nasopharyngeal swab specimens and should not be used as a sole basis for treatment. Nasal washings and aspirates are unacceptable for Xpert Xpress SARS-CoV-2/FLU/RSV testing.  Fact Sheet for Patients: hEntrepreneurPulse.com.au Fact Sheet for Healthcare Providers: hIncredibleEmployment.be This test is not yet approved or cleared by the [place] and has been  authorized for detection and/or diagnosis of SARS-CoV-2 by FDA under an Emergency Use Authorization (EUA). This EUA will remain in effect (meaning this test can be used) for the duration of the COVID-19 declaration under Section 564(b)(1) of the Act, 21 U.S.C. section 360bbb-3(b)(1), unless the authorization is terminated or revoked.     Resp Syncytial Virus by PCR NEGATIVE NEGATIVE Final    Comment: (NOTE) Fact Sheet for Patients: EntrepreneurPulse.com.au  Fact Sheet for Healthcare Providers: IncredibleEmployment.be  This test is not yet approved or cleared by the [place] FDA and has been authorized for detection and/or diagnosis of SARS-CoV-2 by FDA under an Emergency  Use Authorization (EUA). This EUA will remain in effect (meaning this test can be used) for the duration of the COVID-19 declaration under Section 564(b)(1) of the Act, 21 U.S.C. section 360bbb-3(b)(1), unless the authorization is terminated or revoked.  Performed at [facility], [address] [place], [place] [address]     Labs: CBC: Recent Labs  Lab 02/18/22 1154 02/18/22 2117 02/19/22 0433  WBC 9.9 7.0 8.1  HGB 13.2 11.1* 12.2*  HCT 39.2 32.8* 36.4*  MCV 92.5 92.7 93.1  PLT 237 189 741   Basic Metabolic Panel: Recent Labs  Lab 02/18/22 1154 02/18/22 2117 02/19/22 0433 02/19/22 1412  NA 132*  --  134*  --   K 2.9*  --  2.9* 3.8  CL 90*  --  98  --   CO2 28  --  28  --   GLUCOSE 112*  --  118*  --   BUN 23  --  19  --   CREATININE 1.56* 1.16 1.15  --   CALCIUM 9.1  --  8.4*  --   MG  --   --  1.3*  --    Liver Function Tests: No results for input(s): "AST", "ALT", "ALKPHOS", "BILITOT", "PROT", "ALBUMIN" in the last 168 hours. CBG: No results for input(s): "GLUCAP" in the last 168 hours.  Discharge time spent: greater than 30 minutes.  This record has been created using Systems analyst. Errors have been sought and corrected,but may not always be located. Such creation errors do not reflect on the standard of care.   Signed: Lorella Nimrod, MD [facility] 02/19/2022

## 2022-02-20 ENCOUNTER — Telehealth: Payer: Medicare Other | Admitting: Gastroenterology

## 2022-02-21 LAB — CALCITONIN: Calcitonin: 25 pg/mL — ABNORMAL HIGH (ref 0.0–8.4)

## 2022-03-07 NOTE — Addendum Note (Signed)
Addended by: Wayna Chalet on: 03/07/2022 02:22 PM   Modules accepted: Orders

## 2022-03-10 DIAGNOSIS — Z9841 Cataract extraction status, right eye: Secondary | ICD-10-CM

## 2022-03-10 DIAGNOSIS — Z9842 Cataract extraction status, left eye: Secondary | ICD-10-CM

## 2022-03-10 HISTORY — DX: Cataract extraction status, right eye: Z98.41

## 2022-03-10 HISTORY — DX: Cataract extraction status, right eye: Z98.42

## 2022-05-12 ENCOUNTER — Other Ambulatory Visit: Payer: Self-pay

## 2022-05-12 NOTE — Telephone Encounter (Signed)
Patient is requesting a refill

## 2022-05-13 ENCOUNTER — Telehealth: Payer: Self-pay

## 2022-05-13 NOTE — Telephone Encounter (Signed)
Unfortunately I do not have any other thoughts on this - I would recommend a second opinion at [facility] or [facility] . I wish I had more options to advise on

## 2022-05-13 NOTE — Telephone Encounter (Signed)
Has he tried chlorpromazine for hiccups?  Stop gabapentin , has he seen neurology ?

## 2022-05-13 NOTE — Telephone Encounter (Signed)
For reflux can try vonaprazan we have samples

## 2022-05-13 NOTE — Telephone Encounter (Signed)
Patient's pharmacy sent a refill requested for Gabapentin. I asked Dr. Vicente Males if it needed to be refilled and he wanted me to call the patient and ask if he took it and has helped or not. Then to let him know and he will decide if he will refill it or not. I then called the patient but had to leave him a voicemail to call me back with an answer.

## 2022-05-13 NOTE — Telephone Encounter (Signed)
Patient's daughter-John Booker called back and stated that gabapentin is not helping him at all. Therefore, she stated that on her dad's last visit you had mentioned that there was one more medication for him to try, so she wants to know if you could prescribe it for him. John Booker also stated that her dad continues to have real bad acid reflux and hiccups on a daily basis and that it has gotten annoying to her father. John Booker would like to know what else to try. Please advise.

## 2022-05-14 NOTE — Telephone Encounter (Signed)
Called patient's daughter-John Booker to let her know that Dr. Vicente Males is recommending Voquezna for her dad to try for his acid reflux so to leave him some samples at the front desk and in a week he notices that it had helped, to call us to let us know so we could send him in the prescription. Unfortunately, Dr. Vicente Males did not what else to recommend about his hiccups and to reach out to his PCP and see if he/she could help.

## 2022-05-20 ENCOUNTER — Other Ambulatory Visit: Payer: Self-pay | Admitting: Gastroenterology

## 2022-05-22 ENCOUNTER — Other Ambulatory Visit: Payer: Self-pay | Admitting: Gastroenterology

## 2022-05-26 ENCOUNTER — Other Ambulatory Visit: Payer: Self-pay | Admitting: Family Medicine

## 2022-06-05 ENCOUNTER — Telehealth: Payer: Medicare Other | Admitting: Gastroenterology

## 2022-06-05 ENCOUNTER — Other Ambulatory Visit: Payer: Self-pay

## 2022-06-05 NOTE — Progress Notes (Deleted)
John Booker , MD [address]  [place], [place] [address]  Main: [phone]  Fax: [phone]   Primary Care Physician: Dion Body, MD  Virtual Visit via Video Note  I connected with patient on 06/05/22 at  1:45 PM EDT by video and verified that I am speaking with the correct person using two identifiers.   I discussed the limitations, risks, security and privacy concerns of performing an evaluation and management service by video  and the availability of in person appointments. I also discussed with the patient that there may be a patient responsible charge related to this service. The patient expressed understanding and agreed to proceed.  Location of Patient: Home Location of Provider: Home Persons involved: Patient and provider only   History of Present Illness: No chief complaint on file.   HPI: John Booker is a 72 y.o. male   Summary of history :    Interval history   ***/***/2023-  ***/***/2023 He is here today to follow-up.  Transfer of care in July 2023 for hiccups ongoing for over 6 months Underwent an upper endoscopy in March 2020 that showed no gross abnormalities.  Hiatal hernia was noted.Subsequently the patient was admitted to [facility] in October 2020.  Prior history of alcohol abuse hypertension and on admission was noted to have intractable hiccups and weight loss was placed on a Thorazine drip and GI was consulted for a weight loss of over 20 pounds in 2 years.  Barium swallow showed esophageal dysmotility and spasm and inability of the lower esophagus to relax and there were concerns for obstructive mass.  Underwent an EGD was recommended outpatient CT chest abdomen and pelvis, colonoscopy for weight loss, outpatient esophageal manometry and motility.   12/28/2020 EGD: Showed erosions in the duodenal bulb hiatal hernia erythema in the stomach abnormal motility was noted in the esophagus increased tone of the LES dilated empirically to  51 French biopsies showed chronic duodenitis reactive gastropathy  06/27/2021 creatinine 1.01 UDS positive for opiates hemoglobin 16.7 g hepatic function panel showed no significant abnormality.   He has had hiccups for over 5 years responded to baclofen to a great extent and at that visit he also had some dysphagia on and off.   10/09/2021: EGD: LA grade B esophagitis seen otherwise no gross abnormalities.   10/01/2021 CT chest abdomen pelvis showed mixed fat and soft tissue lesion in the posterior mediastinum and PET scan recommended enhancing non-Dula focus in the posterior urinary bladder measuring 11 mm urology consult recommended retained versus reflux material in the esophagus with diffuse esophageal wall thickening suggestive of reflux esophagitis.  Colonic diverticulosis.   10/10/2021 MRI thoracic spine shows partially fat density enhancing masses in the left paraspinal soft tissue at T9-T10 with 3 distinct lesions identified PET scan performed on 11/13/2021 showed no evidence of malignancy left thoracic paraspinal mixed fat and soft tissue attenuation shows absence of hypermetabolic him suggest continued follow-up by CT chest in 6 months.   11/15/2021 CT cervical spine shows multilevel foraminal stenosis ossification of the posterior ligament resulting in severe central canal stenosis at C3-C4 and to lesser extent at C2-C3   11/20/2021 MRI lumbar spine shows 2 small soft tissue fluid collections arising from L5-S1 lower lumbar predominantly degenerative disc disease   He was seen by urology and underwent a cystoscopy which did not reveal any bladder mass      Interval history 04/20/2021-06/05/2022   02/14/2022 MRI brain no acute intracranial abnormality  or mass mild cerebral atrophy At this last visit we put him on Protonix 40 mg twice daily no improvement with his hiccups   The following day after his last visit he had expressed active suicidal thoughts and we are asked him to either go to the  ER or see his primary care doctor to discuss further we communicated that to Dr. Anitra Lauth to him and his daughter hiccups persists taking baclofen. ***    He tried Gabapentin that did not work.  Was having reflux was provided vonoprazan samples on 05/14/2022.  Current Outpatient Medications  Medication Sig Dispense Refill   acetaminophen (TYLENOL) 325 MG tablet Take 2 tablets (650 mg total) by mouth every 6 (six) hours as needed for mild pain or fever. 60 tablet 0   aspirin EC 81 MG EC tablet Take 1 tablet (81 mg total) by mouth daily. Swallow whole. 30 tablet 11   baclofen (LIORESAL) 10 MG tablet Take 10 mg by mouth 2 (two) times daily.     escitalopram (LEXAPRO) 5 MG tablet Take 5 mg by mouth daily.     feeding supplement (ENSURE ENLIVE / ENSURE PLUS) LIQD Take 237 mLs by mouth 3 (three) times daily between meals. 237 mL 12   furosemide (LASIX) 40 MG tablet Take 40 mg by mouth daily.     gabapentin (NEURONTIN) 100 MG capsule Take 1 capsule (100 mg total) by mouth 3 (three) times daily. 180 capsule 1   losartan (COZAAR) 100 MG tablet Take 1 tablet (100 mg total) by mouth daily. 90 tablet 1   Magnesium 200 MG CHEW Chew 200 mg by mouth daily. 90 tablet 0   Multiple Vitamin (MULTIVITAMIN WITH MINERALS) TABS tablet Take 1 tablet by mouth daily.     ondansetron (ZOFRAN) 4 MG tablet Take 1 tablet (4 mg total) by mouth every 8 (eight) hours as needed for nausea or vomiting. 20 tablet 0   pantoprazole (PROTONIX) 40 MG tablet TAKE ONE (1) TABLET BY MOUTH TWO TIMES PER DAY 180 tablet 0   potassium chloride SA (KLOR-CON M) 20 MEQ tablet Take 1 tablet (20 mEq total) by mouth daily. 30 tablet 0   sildenafil (REVATIO) 20 MG tablet Take 1-5 pills about 30 min prior to sex. Start with 1 and increase as needed. 30 tablet 3   sodium chloride (OCEAN) 0.65 % SOLN nasal spray Place 1 spray into both nostrils as needed for congestion (nose irritation). 88 mL 0   vitamin B-12 1000 MCG tablet Take 1  tablet (1,000 mcg total) by mouth daily. 30 tablet 1   No current facility-administered medications for this visit.    Allergies as of 06/05/2022 - Review Complete 02/19/2022  Allergen Reaction Noted   Adalat [nifedipine]  12/05/2015   Amlodipine Other (See Comments) 07/14/2017    Review of Systems:    All systems reviewed and negative except where noted in HPI.  General Appearance:    Alert, cooperative, no distress, appears stated age  Head:    Normocephalic, without obvious abnormality, atraumatic  Eyes:    PERRL, conjunctiva/corneas clear,  Ears:    Grossly normal hearing    Neurologic:  Grossly normal    Observations/Objective:  Labs: CMP     Component Value Date/Time   NA 134 (L) 02/19/2022 0433   NA 135 (L) 04/15/2012 2049   K 3.8 02/19/2022 1412   K 3.5 04/15/2012 2049   CL 98 02/19/2022 0433   CL 98 04/15/2012 2049  CO2 28 02/19/2022 0433   CO2 30 04/15/2012 2049   GLUCOSE 118 (H) 02/19/2022 0433   GLUCOSE 107 (H) 04/15/2012 2049   BUN 19 02/19/2022 0433   BUN 17 04/15/2012 2049   CREATININE 1.15 02/19/2022 0433   CREATININE 1.15 12/20/2020 0946   CALCIUM 8.4 (L) 02/19/2022 0433   CALCIUM 8.5 04/15/2012 2049   PROT 7.4 12/31/2021 1014   PROT 7.3 04/15/2012 2049   ALBUMIN 4.1 12/31/2021 1014   ALBUMIN 3.4 04/15/2012 2049   AST 19 12/31/2021 1014   AST 72 (H) 04/15/2012 2049   ALT 13 12/31/2021 1014   ALT 63 04/15/2012 2049   ALKPHOS 67 12/31/2021 1014   ALKPHOS 88 04/15/2012 2049   BILITOT 1.3 (H) 12/31/2021 1014   BILITOT 0.8 04/15/2012 2049   GFRNONAA >60 02/19/2022 0433   GFRNONAA 73 12/20/2019 0916   GFRAA 85 12/20/2019 0916   Lab Results  Component Value Date   WBC 8.1 02/19/2022   HGB 12.2 (L) 02/19/2022   HCT 36.4 (L) 02/19/2022   MCV 93.1 02/19/2022   PLT 241 02/19/2022    Imaging Studies: No results found.  Assessment and Plan:   John Booker is a 72 y.o. y/o male  here to see me  for hiccups.  Ongoing for over 5 years and  EGD showed increased tone of the lower esophagealsphincter.  Recent EGD showed esophagitis .  Causes for hiccups may be central in the brain or peripheral along the course of the phrenic nerve or related to the diaphragm versus significant reflux causing the same.  We talked about a colonoscopy but he is not keen at this point of time this is probably unrelated to his reflux.  Talked about repeating his upper endoscopy to check for healing of the esophagitis they would like to think about it     Plan 1.  Further evaluation with hiccups recommended refer to neurology 2.  From the therapeutics point of view since baclofen has not worked we will stop it and commence him on gabapentin 100 mg 3 times daily.  Explained it will take a few weeks to see if it works.  If it does work he has been advised to stop it a few days after.  I will call him in 4 weeks to check how he is doing.     I have explained to them in detail that the causes of hiccups that are chronic are multiple not just related to gastroenterological conditions.  I explained I have done as much workup as I could do from the GI point of view and given my recommendations hence I am referring further to neurology to see if there is anything else they can do.      I discussed the assessment and treatment plan with the patient. The patient was provided an opportunity to ask questions and all were answered. The patient agreed with the plan and demonstrated an understanding of the instructions.   The patient was advised to call back or seek an in-person evaluation if the symptoms worsen or if the condition fails to improve as anticipated.  I provided *** minutes of face-to-face time during this encounter.  Dr John Bellows MD,MRCP [facility]) Gastroenterology/Hepatology Pager: [phone]   Speech recognition software was used to dictate this note.

## 2022-08-21 ENCOUNTER — Other Ambulatory Visit: Payer: Self-pay | Admitting: Gastroenterology

## 2022-09-08 ENCOUNTER — Telehealth: Payer: Self-pay

## 2022-09-08 NOTE — Telephone Encounter (Signed)
Pt's daughter, Shari Prows called stating [facility] neurology has refused to see this patient as they do not see patients for this diagnosis (hiccups) Pt's daughter would like you to refer him to [facility], Dr. Conception Oms. They will see him for this.

## 2022-09-08 NOTE — Telephone Encounter (Signed)
Called Caressa-patient's daughter to let her know that I went ahead and faxed her father's chart to [facility] with Dr. Lucretia Roers as requested by her. She was happy to hear and I told her that they will review his chart and then they call the patient to schedule an appointment. Caressa understood and had no further questions.

## 2022-10-01 ENCOUNTER — Other Ambulatory Visit: Payer: Self-pay | Admitting: Gastroenterology

## 2022-10-01 MED ORDER — ONDANSETRON HCL 4 MG PO TABS: 4.0000 mg | ORAL_TABLET | Freq: Three times a day (TID) | ORAL | 0 refills | Status: AC | PRN

## 2022-10-06 NOTE — Telephone Encounter (Signed)
John Booker-Patient's daughter called stating that she had called [facility] in reference to her father's referral and stated that they told her that they had not received our referral. However, I did fax it on 09/08/2022 and received confirmation that they had received it. However, I went ahead and faxed it again today and received confirmation again. I will continue to fax the referral until they get tired of getting my fax or Mrs. John Booker calling them.

## 2022-11-04 ENCOUNTER — Encounter: Payer: Self-pay | Admitting: Ophthalmology

## 2022-11-11 NOTE — Discharge Instructions (Signed)

## 2022-11-12 ENCOUNTER — Telehealth: Payer: Self-pay

## 2022-11-12 NOTE — Telephone Encounter (Signed)
Patient never picked up samples of Voquezna so returned them back into inventory

## 2022-11-13 ENCOUNTER — Ambulatory Visit: Payer: Medicare Other | Admitting: Anesthesiology

## 2022-11-13 ENCOUNTER — Encounter: Payer: Self-pay | Admitting: Ophthalmology

## 2022-11-13 ENCOUNTER — Ambulatory Visit
Admission: RE | Admit: 2022-11-13 | Discharge: 2022-11-13 | Disposition: A | Discharge status: Home / Self Care | Payer: Medicare Other | Attending: Ophthalmology | Admitting: Ophthalmology

## 2022-11-13 ENCOUNTER — Other Ambulatory Visit: Payer: Self-pay

## 2022-11-13 ENCOUNTER — Encounter
Admission: RE | Disposition: A | Discharge status: Home / Self Care | Payer: Self-pay | Source: Home / Self Care | Attending: Ophthalmology

## 2022-11-13 DIAGNOSIS — Z87891 Personal history of nicotine dependence: Secondary | ICD-10-CM | POA: Diagnosis not present

## 2022-11-13 DIAGNOSIS — Z79899 Other long term (current) drug therapy: Secondary | ICD-10-CM | POA: Insufficient documentation

## 2022-11-13 DIAGNOSIS — K449 Diaphragmatic hernia without obstruction or gangrene: Secondary | ICD-10-CM | POA: Diagnosis not present

## 2022-11-13 DIAGNOSIS — N189 Chronic kidney disease, unspecified: Secondary | ICD-10-CM | POA: Diagnosis not present

## 2022-11-13 DIAGNOSIS — K219 Gastro-esophageal reflux disease without esophagitis: Secondary | ICD-10-CM | POA: Diagnosis not present

## 2022-11-13 DIAGNOSIS — I509 Heart failure, unspecified: Secondary | ICD-10-CM | POA: Diagnosis not present

## 2022-11-13 DIAGNOSIS — H2512 Age-related nuclear cataract, left eye: Secondary | ICD-10-CM | POA: Insufficient documentation

## 2022-11-13 DIAGNOSIS — I130 Hypertensive heart and chronic kidney disease with heart failure and stage 1 through stage 4 chronic kidney disease, or unspecified chronic kidney disease: Secondary | ICD-10-CM | POA: Diagnosis not present

## 2022-11-13 HISTORY — DX: Other amnesia: R41.3

## 2022-11-13 HISTORY — DX: Hiccough: R06.6

## 2022-11-13 HISTORY — PX: CATARACT EXTRACTION W/PHACO: SHX586

## 2022-11-13 SURGERY — PHACOEMULSIFICATION, CATARACT, WITH IOL INSERTION
Anesthesia: Monitor Anesthesia Care | Site: Eye | Laterality: Left

## 2022-11-13 MED ORDER — SODIUM CHLORIDE 0.9% FLUSH
INTRAVENOUS | Status: DC | PRN
  Administered 2022-11-13: 10 mL via INTRAVENOUS
  Administered 2022-11-13: 20 mL via INTRAVENOUS

## 2022-11-13 MED ORDER — SIGHTPATH DOSE#1 NA HYALUR & NA CHOND-NA HYALUR IO KIT
PACK | INTRAOCULAR | Status: DC | PRN
  Administered 2022-11-13: 1 via OPHTHALMIC

## 2022-11-13 MED ORDER — BRIMONIDINE TARTRATE-TIMOLOL 0.2-0.5 % OP SOLN
OPHTHALMIC | Status: DC | PRN
  Administered 2022-11-13: 1 [drp] via OPHTHALMIC

## 2022-11-13 MED ORDER — LIDOCAINE HCL (PF) 2 % IJ SOLN
INTRAOCULAR | Status: DC | PRN
  Administered 2022-11-13: 1 mL via INTRAOCULAR

## 2022-11-13 MED ORDER — TETRACAINE HCL 0.5 % OP SOLN
OPHTHALMIC | Status: DC | PRN
  Administered 2022-11-13: 2 [drp] via OPHTHALMIC

## 2022-11-13 MED ORDER — MIDAZOLAM HCL 2 MG/2ML IJ SOLN
INTRAMUSCULAR | Status: DC | PRN
  Administered 2022-11-13: 2 mg via INTRAVENOUS
  Administered 2022-11-13 (×2): 1 mg via INTRAVENOUS

## 2022-11-13 MED ORDER — TETRACAINE HCL 0.5 % OP SOLN
1.0000 [drp] | OPHTHALMIC | Status: DC | PRN
  Administered 2022-11-13 (×3): 1 [drp] via OPHTHALMIC

## 2022-11-13 MED ORDER — ARMC OPHTHALMIC DILATING DROPS
1.0000 | OPHTHALMIC | Status: DC | PRN
  Administered 2022-11-13 (×3): 1 via OPHTHALMIC

## 2022-11-13 MED ORDER — LABETALOL HCL 5 MG/ML IV SOLN
10.0000 mg | Freq: Once | INTRAVENOUS | Status: AC
  Administered 2022-11-13: 10 mg via INTRAVENOUS

## 2022-11-13 MED ORDER — MOXIFLOXACIN HCL 0.5 % OP SOLN
OPHTHALMIC | Status: DC | PRN
  Administered 2022-11-13: .2 mL via OPHTHALMIC

## 2022-11-13 MED ORDER — PHENYLEPHRINE-KETOROLAC 1-0.3 % IO SOLN
INTRAOCULAR | Status: DC | PRN
  Administered 2022-11-13: 103 mL via OPHTHALMIC

## 2022-11-13 MED ORDER — FENTANYL CITRATE (PF) 100 MCG/2ML IJ SOLN
INTRAMUSCULAR | Status: DC | PRN
  Administered 2022-11-13 (×2): 50 ug via INTRAVENOUS

## 2022-11-13 MED ORDER — SIGHTPATH DOSE#1 BSS IO SOLN
INTRAOCULAR | Status: DC | PRN
  Administered 2022-11-13: 15 mL

## 2022-11-13 MED ORDER — LACTATED RINGERS IV SOLN: INTRAVENOUS | Status: DC

## 2022-11-13 SURGICAL SUPPLY — 10 items
CATARACT SUITE SIGHTPATH (MISCELLANEOUS) ×1
DISSECTOR HYDRO NUCLEUS 50X22 (MISCELLANEOUS) ×1 IMPLANT
DRSG TEGADERM 2-3/8X2-3/4 SM (GAUZE/BANDAGES/DRESSINGS) ×1 IMPLANT
FEE CATARACT SUITE SIGHTPATH (MISCELLANEOUS) ×1 IMPLANT
GLOVE SURG SYN 7.5 E (GLOVE) ×1
GLOVE SURG SYN 7.5 PF PI (GLOVE) ×1 IMPLANT
GLOVE SURG SYN 8.5 E (GLOVE) ×1
GLOVE SURG SYN 8.5 PF PI (GLOVE) ×1 IMPLANT
LENS ENVISTA ASPIRE 19.5 (Intraocular Lens) IMPLANT
LENS IOL ENVISTA ASPR 19.5 (Intraocular Lens) IMPLANT

## 2022-11-13 NOTE — Anesthesia Postprocedure Evaluation (Signed)
Anesthesia Post Note  Patient: John Booker  Procedure(s) Performed: CATARACT EXTRACTION PHACO AND INTRAOCULAR LENS PLACEMENT (IOC) LEFT OMIDRIA   13.58  01:23.3 (Left: Eye)  Patient location during evaluation: PACU Anesthesia Type: MAC Level of consciousness: awake and alert Pain management: pain level controlled Vital Signs Assessment: post-procedure vital signs reviewed and stable Respiratory status: spontaneous breathing, nonlabored ventilation, respiratory function stable and patient connected to nasal cannula oxygen Cardiovascular status: blood pressure returned to baseline and stable Postop Assessment: no apparent nausea or vomiting Anesthetic complications: no   No notable events documented.   Last Vitals:  Vitals:   11/13/22 1150 11/13/22 1158  BP: 139/81 (!) 148/89  Pulse: 80 77  Resp: 19 18  Temp: (!) 36.2 C (!) 36.2 C  SpO2: 99% 97%    Last Pain:  Vitals:   11/13/22 1158  TempSrc:   PainSc: 0-No pain                 Corinda Gubler

## 2022-11-13 NOTE — H&P (Signed)
[facility]   Primary Care Physician:  Marisue Ivan, MD Ophthalmologist: Dr. Deberah Pelton  Pre-Procedure History & Physical: HPI:  John Booker is a 72 y.o. male here for cataract surgery.   Past Medical History:  Diagnosis Date   Chronic hiccups    intermittent   Hypertension    Memory changes     Past Surgical History:  Procedure Laterality Date   ESOPHAGOGASTRODUODENOSCOPY (EGD) WITH PROPOFOL N/A 05/26/2018   Procedure: ESOPHAGOGASTRODUODENOSCOPY (EGD) WITH PROPOFOL;  Surgeon: Pasty Spillers, MD;  Location: [facility] ENDOSCOPY;  Service: Endoscopy;  Laterality: N/A;   ESOPHAGOGASTRODUODENOSCOPY (EGD) WITH PROPOFOL N/A 12/28/2020   Procedure: ESOPHAGOGASTRODUODENOSCOPY (EGD) WITH PROPOFOL;  Surgeon: Jaynie Collins, DO;  Location: [facility] ENDOSCOPY;  Service: Gastroenterology;  Laterality: N/A;   ESOPHAGOGASTRODUODENOSCOPY (EGD) WITH PROPOFOL N/A 10/09/2021   Procedure: ESOPHAGOGASTRODUODENOSCOPY (EGD) WITH PROPOFOL;  Surgeon: Wyline Mood, MD;  Location: [facility] ENDOSCOPY;  Service: Gastroenterology;  Laterality: N/A;   INCISION AND DRAINAGE PERIRECTAL ABSCESS N/A 11/19/2015   Procedure: IRRIGATION AND DEBRIDEMENT PERIRECTAL ABSCESS and debridement of scrotal abscess;  Surgeon: Lattie Haw, MD;  Location: [facility] ORS;  Service: General;  Laterality: N/A;   INCISION AND DRAINAGE PERIRECTAL ABSCESS N/A 11/20/2015   Procedure: IRRIGATION AND DEBRIDEMENT PERIRECTAL ABSCESS / WITH DRESSING CHANGE;  Surgeon: Lattie Haw, MD;  Location: [facility] ORS;  Service: General;  Laterality: N/A;  peri rectal site   INCISION AND DRAINAGE PERIRECTAL ABSCESS N/A 11/21/2015   Procedure: IRRIGATION AND DEBRIDEMENT PERIRECTAL ABSCESS WITH DRESSING CHANGE;  Surgeon: Lattie Haw, MD;  Location: [facility] ORS;  Service: General;  Laterality: N/A;   MINOR HEMORRHOIDECTOMY  1989   TEE WITHOUT CARDIOVERSION N/A 11/20/2021   Procedure: TRANSESOPHAGEAL ECHOCARDIOGRAM (TEE);  Surgeon: Lamar Blinks, MD;  Location: [facility] ORS;  Service: Cardiovascular;  Laterality: N/A;  8:00am    Prior to Admission medications   Medication Sig Start Date End Date Taking? Authorizing Provider  acetaminophen (TYLENOL) 325 MG tablet Take 2 tablets (650 mg total) by mouth every 6 (six) hours as needed for mild pain or fever. 06/27/21  Yes Arnetha Courser, MD  gabapentin (NEURONTIN) 100 MG capsule TAKE 1 CAPSULE BY MOUTH 3 TIMES DAILY 08/25/22  Yes Wyline Mood, MD  losartan (COZAAR) 100 MG tablet Take 1 tablet (100 mg total) by mouth daily. 05/13/21  Yes Karamalegos, Netta Neat, DO  ondansetron (ZOFRAN) 4 MG tablet Take 1 tablet (4 mg total) by mouth every 8 (eight) hours as needed for nausea or vomiting. 10/01/22  Yes Wyline Mood, MD  pantoprazole (PROTONIX) 40 MG tablet TAKE ONE (1) TABLET BY MOUTH TWO TIMES PER DAY 05/23/22  Yes Wyline Mood, MD  sodium chloride (OCEAN) 0.65 % SOLN nasal spray Place 1 spray into both nostrils as needed for congestion (nose irritation). 02/19/22  Yes Arnetha Courser, MD  aspirin EC 81 MG EC tablet Take 1 tablet (81 mg total) by mouth daily. Swallow whole. Patient not taking: Reported on 11/04/2022 06/27/21   Arnetha Courser, MD  baclofen (LIORESAL) 10 MG tablet Take 10 mg by mouth 2 (two) times daily. Patient not taking: Reported on 11/04/2022    [provider]  escitalopram (LEXAPRO) 5 MG tablet Take 5 mg by mouth daily. Patient not taking: Reported on 11/04/2022 02/07/22   [provider]  furosemide (LASIX) 40 MG tablet Take 40 mg by mouth daily. Patient not taking: Reported on 11/04/2022 11/07/21   [provider]  Magnesium 200 MG CHEW Chew 200 mg by mouth  daily. Patient not taking: Reported on 11/04/2022 02/19/22   Arnetha Courser, MD  Multiple Vitamin (MULTIVITAMIN WITH MINERALS) TABS tablet Take 1 tablet by mouth daily. Patient not taking: Reported on 11/04/2022 12/31/20   Esaw Grandchild A, DO  potassium chloride SA (KLOR-CON M) 20 MEQ tablet Take 1  tablet (20 mEq total) by mouth daily. Patient not taking: Reported on 11/04/2022 02/19/22   Arnetha Courser, MD  vitamin B-12 1000 MCG tablet Take 1 tablet (1,000 mcg total) by mouth daily. Patient not taking: Reported on 11/04/2022 12/31/20   Esaw Grandchild A, DO    Allergies as of 10/09/2022 - Review Complete 02/19/2022  Allergen Reaction Noted   Adalat [nifedipine]  12/05/2015   Amlodipine Other (See Comments) 07/14/2017    Family History  Problem Relation Age of Onset   Hypertension Father     Social History   Socioeconomic History   Marital status: Legally Separated    Spouse name: Not on file   Number of children: Not on file   Years of education: Not on file   Highest education level: Not on file  Occupational History   Not on file  Tobacco Use   Smoking status: Former    Current packs/day: 0.00    Average packs/day: 0.5 packs/day for 38.0 years (19.0 ttl pk-yrs)    Types: Cigarettes    Start date: 04/10/1984    Quit date: 04/10/2022    Years since quitting: 0.5   Smokeless tobacco: Former  Building services engineer status: Never Used  Substance and Sexual Activity   Alcohol use: Not Currently    Comment: several months,[place]   Drug use: No   Sexual activity: Not Currently  Other Topics Concern   Not on file  Social History Narrative   Not on file   Social Determinants of Health   Financial Resource Strain: Not on file  Food Insecurity: Not on file  Transportation Needs: Not on file  Physical Activity: Not on file  Stress: Not on file  Social Connections: Not on file  Intimate Partner Violence: Not on file    Review of Systems: See HPI, otherwise negative ROS  Physical Exam: Ht 6\' 3"  (1.905 m)   Wt 96.6 kg   BMI 26.62 kg/m  General:   Alert, cooperative in NAD Head:  Normocephalic and atraumatic. Respiratory:  Normal work of breathing. Cardiovascular:  RRR  Impression/Plan: John Booker is here for cataract surgery.  Risks, benefits,  limitations, and alternatives regarding cataract surgery have been reviewed with the patient.  Questions have been answered.  All parties agreeable.   Estanislado Pandy, MD  11/13/2022, 7:18 AM

## 2022-11-13 NOTE — Op Note (Signed)
OPERATIVE NOTE  John Booker [id number] 11/13/2022   PREOPERATIVE DIAGNOSIS: Nuclear sclerotic cataract left eye. H25.12   POSTOPERATIVE DIAGNOSIS: Nuclear sclerotic cataract left eye. H25.12   PROCEDURE:  Phacoemusification with posterior chamber intraocular lens placement of the left eye  Ultrasound time: Procedure(s): CATARACT EXTRACTION PHACO AND INTRAOCULAR LENS PLACEMENT (IOC) LEFT OMIDRIA   13.58  01:23.3 (Left)  LENS:   Implant Name Type Inv. Item Serial No. Manufacturer Lot No. LRB No. Used Action  ENVISTA IOL Intraocular Lens  [id number] [employer]  Left 1 Wasted      SURGEON:  Julious Payer. Rolley Sims, MD   ANESTHESIA:  Topical with tetracaine drops, augmented with 1% preservative-free intracameral lidocaine.   COMPLICATIONS:  None.   DESCRIPTION OF PROCEDURE:  The patient was identified in the holding room and transported to the operating room and placed in the supine position under the operating microscope.  The left eye was identified as the operative eye, which was prepped and draped in the usual sterile ophthalmic fashion.   A 1 millimeter clear-corneal paracentesis was made inferotemporally. Preservative-free 1% lidocaine mixed with 1:1,000 bisulfite-free aqueous solution of epinephrine was injected into the anterior chamber. The anterior chamber was then filled with Viscoat viscoelastic. A 2.4 millimeter keratome was used to make a clear-corneal incision superotemporally. A curvilinear capsulorrhexis was made with a cystotome and capsulorrhexis forceps. Balanced salt solution was used to hydrodissect and hydrodelineate the nucleus. Phacoemulsification was then used to remove the lens nucleus and epinucleus. The remaining cortex was then removed using the irrigation and aspiration handpiece. Provisc was then placed into the capsular bag to distend it for lens placement. A +19.50 D EE (formerly [id number]) intraocular lens was then injected into the capsular bag. The  remaining viscoelastic was aspirated.   Wounds were hydrated with balanced salt solution.  The anterior chamber was inflated to a physiologic pressure with balanced salt solution.  No wound leaks were noted. Vigamox was injected intracamerally.  Timolol and Brimonidine drops were applied to the eye.  The patient was taken to the recovery room in stable condition without complications of anesthesia or surgery.  John Booker 11/13/2022, 11:47 AM

## 2022-11-13 NOTE — Anesthesia Preprocedure Evaluation (Signed)
Anesthesia Evaluation  Patient identified by MRN, date of birth, ID band Patient awake and Patient confused    Reviewed: Allergy & Precautions, NPO status , Patient's Chart, lab work & pertinent test results  History of Anesthesia Complications Negative for: history of anesthetic complications  Airway Mallampati: III  TM Distance: >3 FB Neck ROM: Full    Dental  (+) Teeth Intact   Pulmonary neg pulmonary ROS, neg sleep apnea, neg COPD, Patient abstained from smoking.Not current smoker, former smoker   Pulmonary exam normal breath sounds clear to auscultation       Cardiovascular Exercise Tolerance: Poor METShypertension, Pt. on medications +CHF  (-) CAD and (-) Past MI Normal cardiovascular exam(-) dysrhythmias  Rhythm:Regular     Neuro/Psych  Headaches  negative psych ROS   GI/Hepatic Neg liver ROS, hiatal hernia,GERD  ,,Patient with chronic hiccups for 4 years. Denies any food reflux. Endorses some liquid reflux with his hiccups but denies any reflux today   Endo/Other  negative endocrine ROSneg diabetes    Renal/GU CRFRenal disease  negative genitourinary   Musculoskeletal   Abdominal   Peds negative pediatric ROS (+)  Hematology negative hematology ROS (+)   Anesthesia Other Findings Past Medical History: No date: Hypertension  Past Surgical History: 05/26/2018: ESOPHAGOGASTRODUODENOSCOPY (EGD) WITH PROPOFOL; N/A     Comment:  Procedure: ESOPHAGOGASTRODUODENOSCOPY (EGD) WITH               PROPOFOL;  Surgeon: Pasty Spillers, MD;  Location:               [facility] ENDOSCOPY;  Service: Endoscopy;  Laterality: N/A; 12/28/2020: ESOPHAGOGASTRODUODENOSCOPY (EGD) WITH PROPOFOL; N/A     Comment:  Procedure: ESOPHAGOGASTRODUODENOSCOPY (EGD) WITH               PROPOFOL;  Surgeon: Jaynie Collins, DO;  Location:              [facility] ENDOSCOPY;  Service: Gastroenterology;  Laterality:               N/A; 11/19/2015:  INCISION AND DRAINAGE PERIRECTAL ABSCESS; N/A     Comment:  Procedure: IRRIGATION AND DEBRIDEMENT PERIRECTAL ABSCESS              and debridement of scrotal abscess;  Surgeon: Lattie Haw, MD;  Location: [facility] ORS;  Service: General;                Laterality: N/A; 11/20/2015: INCISION AND DRAINAGE PERIRECTAL ABSCESS; N/A     Comment:  Procedure: IRRIGATION AND DEBRIDEMENT PERIRECTAL ABSCESS              / WITH DRESSING CHANGE;  Surgeon: Lattie Haw, MD;                Location: [facility] ORS;  Service: General;  Laterality: N/A;               peri rectal site 11/21/2015: INCISION AND DRAINAGE PERIRECTAL ABSCESS; N/A     Comment:  Procedure: IRRIGATION AND DEBRIDEMENT PERIRECTAL ABSCESS              WITH DRESSING CHANGE;  Surgeon: Lattie Haw, MD;                Location: [facility] ORS;  Service: General;  Laterality: N/A; 1989: MINOR HEMORRHOIDECTOMY  BMI    Body Mass Index: 30.00 kg/m  Reproductive/Obstetrics negative OB ROS                              Anesthesia Physical Anesthesia Plan  ASA: 3  Anesthesia Plan: MAC   Post-op Pain Management:    Induction: Intravenous  PONV Risk Score and Plan: 1 and Midazolam  Airway Management Planned: Nasal Cannula  Additional Equipment:   Intra-op Plan:   Post-operative Plan:   Informed Consent: I have reviewed the patients History and Physical, chart, labs and discussed the procedure including the risks, benefits and alternatives for the proposed anesthesia with the patient or authorized representative who has indicated his/her understanding and acceptance.       Plan Discussed with: CRNA and Surgeon  Anesthesia Plan Comments: (Explained risks of anesthesia, including PONV, and rare emergencies such as cardiac events, respiratory problems, and allergic reactions, requiring invasive intervention. Discussed the role of CRNA in patient's perioperative care. Patient understands. )          Anesthesia Quick Evaluation

## 2022-11-13 NOTE — Transfer of Care (Signed)
Immediate Anesthesia Transfer of Care Note  Patient: John Booker  Procedure(s) Performed: CATARACT EXTRACTION PHACO AND INTRAOCULAR LENS PLACEMENT (IOC) LEFT OMIDRIA   13.58  01:23.3 (Left: Eye)  Patient Location: PACU  Anesthesia Type:MAC  Level of Consciousness: awake, alert , and oriented  Airway & Oxygen Therapy: Patient Spontanous Breathing  Post-op Assessment: Report given to RN and Post -op Vital signs reviewed and stable  Post vital signs: Reviewed  Last Vitals: MDA aware of BP.    Vitals Value Taken Time  BP 155/118 11/13/22 1150  Temp 36.2 C 11/13/22 1150  Pulse 76 11/13/22 1153  Resp 20 11/13/22 1153  SpO2 99 % 11/13/22 1153  Vitals shown include unfiled device data.  Last Pain:  Vitals:   11/13/22 0904  TempSrc: Temporal  PainSc: 0-No pain         Complications: No notable events documented.

## 2022-11-14 ENCOUNTER — Encounter: Payer: Self-pay | Admitting: Ophthalmology

## 2022-11-18 ENCOUNTER — Encounter: Payer: Self-pay | Admitting: Ophthalmology

## 2022-11-20 ENCOUNTER — Encounter: Payer: Self-pay | Admitting: General Practice

## 2022-11-22 ENCOUNTER — Emergency Department: Payer: Medicare Other

## 2022-11-22 ENCOUNTER — Inpatient Hospital Stay
Admission: EM | Admit: 2022-11-22 | Discharge: 2022-11-26 | DRG: 871 | Disposition: A | Discharge status: Home / Self Care | Payer: Medicare Other | Attending: Hospitalist | Admitting: Hospitalist

## 2022-11-22 DIAGNOSIS — R6520 Severe sepsis without septic shock: Secondary | ICD-10-CM | POA: Diagnosis present

## 2022-11-22 DIAGNOSIS — E871 Hypo-osmolality and hyponatremia: Secondary | ICD-10-CM | POA: Diagnosis present

## 2022-11-22 DIAGNOSIS — R519 Headache, unspecified: Secondary | ICD-10-CM | POA: Diagnosis present

## 2022-11-22 DIAGNOSIS — G8929 Other chronic pain: Secondary | ICD-10-CM | POA: Diagnosis present

## 2022-11-22 DIAGNOSIS — Z8619 Personal history of other infectious and parasitic diseases: Secondary | ICD-10-CM

## 2022-11-22 DIAGNOSIS — I5032 Chronic diastolic (congestive) heart failure: Secondary | ICD-10-CM | POA: Diagnosis present

## 2022-11-22 DIAGNOSIS — E872 Acidosis, unspecified: Secondary | ICD-10-CM | POA: Diagnosis present

## 2022-11-22 DIAGNOSIS — Z87891 Personal history of nicotine dependence: Secondary | ICD-10-CM

## 2022-11-22 DIAGNOSIS — Z79899 Other long term (current) drug therapy: Secondary | ICD-10-CM

## 2022-11-22 DIAGNOSIS — Z66 Do not resuscitate: Secondary | ICD-10-CM | POA: Diagnosis present

## 2022-11-22 DIAGNOSIS — W19XXXA Unspecified fall, initial encounter: Secondary | ICD-10-CM | POA: Diagnosis present

## 2022-11-22 DIAGNOSIS — Z87898 Personal history of other specified conditions: Secondary | ICD-10-CM

## 2022-11-22 DIAGNOSIS — A4159 Other Gram-negative sepsis: Secondary | ICD-10-CM | POA: Diagnosis not present

## 2022-11-22 DIAGNOSIS — R509 Fever, unspecified: Principal | ICD-10-CM

## 2022-11-22 DIAGNOSIS — A419 Sepsis, unspecified organism: Secondary | ICD-10-CM

## 2022-11-22 DIAGNOSIS — R410 Disorientation, unspecified: Secondary | ICD-10-CM

## 2022-11-22 DIAGNOSIS — Z8249 Family history of ischemic heart disease and other diseases of the circulatory system: Secondary | ICD-10-CM

## 2022-11-22 DIAGNOSIS — R066 Hiccough: Secondary | ICD-10-CM | POA: Diagnosis present

## 2022-11-22 DIAGNOSIS — Z961 Presence of intraocular lens: Secondary | ICD-10-CM | POA: Diagnosis present

## 2022-11-22 DIAGNOSIS — G9341 Metabolic encephalopathy: Secondary | ICD-10-CM

## 2022-11-22 DIAGNOSIS — Z1152 Encounter for screening for COVID-19: Secondary | ICD-10-CM

## 2022-11-22 DIAGNOSIS — N200 Calculus of kidney: Secondary | ICD-10-CM | POA: Diagnosis present

## 2022-11-22 DIAGNOSIS — I10 Essential (primary) hypertension: Secondary | ICD-10-CM | POA: Diagnosis present

## 2022-11-22 DIAGNOSIS — N400 Enlarged prostate without lower urinary tract symptoms: Secondary | ICD-10-CM | POA: Diagnosis present

## 2022-11-22 DIAGNOSIS — E876 Hypokalemia: Secondary | ICD-10-CM | POA: Diagnosis present

## 2022-11-22 DIAGNOSIS — M542 Cervicalgia: Secondary | ICD-10-CM | POA: Diagnosis present

## 2022-11-22 DIAGNOSIS — R222 Localized swelling, mass and lump, trunk: Secondary | ICD-10-CM | POA: Diagnosis present

## 2022-11-22 DIAGNOSIS — J9859 Other diseases of mediastinum, not elsewhere classified: Secondary | ICD-10-CM | POA: Diagnosis present

## 2022-11-22 DIAGNOSIS — I110 Hypertensive heart disease with heart failure: Secondary | ICD-10-CM | POA: Diagnosis present

## 2022-11-22 DIAGNOSIS — Z9841 Cataract extraction status, right eye: Secondary | ICD-10-CM

## 2022-11-22 DIAGNOSIS — Z888 Allergy status to other drugs, medicaments and biological substances status: Secondary | ICD-10-CM

## 2022-11-22 LAB — CBC WITH DIFFERENTIAL/PLATELET
Abs Immature Granulocytes: 0.06 K/uL (ref 0.00–0.07)
Basophils Absolute: 0 K/uL (ref 0.0–0.1)
Basophils Relative: 0 %
Eosinophils Absolute: 0 K/uL (ref 0.0–0.5)
Eosinophils Relative: 0 %
HCT: 46 % (ref 39.0–52.0)
Hemoglobin: 15.6 g/dL (ref 13.0–17.0)
Immature Granulocytes: 1 %
Lymphocytes Relative: 2 %
Lymphs Abs: 0.2 K/uL — ABNORMAL LOW (ref 0.7–4.0)
MCH: 31.6 pg (ref 26.0–34.0)
MCHC: 33.9 g/dL (ref 30.0–36.0)
MCV: 93.1 fL (ref 80.0–100.0)
Monocytes Absolute: 0.2 K/uL (ref 0.1–1.0)
Monocytes Relative: 2 %
Neutro Abs: 7.9 K/uL — ABNORMAL HIGH (ref 1.7–7.7)
Neutrophils Relative %: 95 %
Platelets: 180 K/uL (ref 150–400)
RBC: 4.94 MIL/uL (ref 4.22–5.81)
RDW: 12.8 % (ref 11.5–15.5)
WBC: 8.3 K/uL (ref 4.0–10.5)
nRBC: 0 % (ref 0.0–0.2)

## 2022-11-22 LAB — COMPREHENSIVE METABOLIC PANEL
ALT: 24 U/L (ref 0–44)
AST: 30 U/L (ref 15–41)
Albumin: 3.6 g/dL (ref 3.5–5.0)
Alkaline Phosphatase: 67 U/L (ref 38–126)
Anion gap: 12 (ref 5–15)
BUN: 18 mg/dL (ref 8–23)
CO2: 22 mmol/L (ref 22–32)
Calcium: 8.8 mg/dL — ABNORMAL LOW (ref 8.9–10.3)
Chloride: 95 mmol/L — ABNORMAL LOW (ref 98–111)
Creatinine, Ser: 1.15 mg/dL (ref 0.61–1.24)
GFR, Estimated: >60 mL/min (ref >60)
Glucose, Bld: 135 mg/dL — ABNORMAL HIGH (ref 70–99)
Potassium: 4.1 mmol/L (ref 3.5–5.1)
Sodium: 129 mmol/L — ABNORMAL LOW (ref 135–145)
Total Bilirubin: 2 mg/dL — ABNORMAL HIGH (ref 0.3–1.2)
Total Protein: 6.8 g/dL (ref 6.5–8.1)

## 2022-11-22 LAB — RESP PANEL BY RT-PCR (RSV, FLU A&B, COVID)  RVPGX2
Influenza A by PCR: NEGATIVE
Influenza B by PCR: NEGATIVE
Resp Syncytial Virus by PCR: NEGATIVE
SARS Coronavirus 2 by RT PCR: NEGATIVE

## 2022-11-22 LAB — URINALYSIS, W/ REFLEX TO CULTURE (INFECTION SUSPECTED)
Bacteria, UA: NONE SEEN
Bilirubin Urine: NEGATIVE
Glucose, UA: NEGATIVE mg/dL
Ketones, ur: NEGATIVE mg/dL
Leukocytes,Ua: NEGATIVE
Nitrite: NEGATIVE
Protein, ur: 30 mg/dL — AB
Specific Gravity, Urine: 1.013 (ref 1.005–1.030)
Squamous Epithelial / HPF: 0 /HPF (ref 0–5)
pH: 9 — ABNORMAL HIGH (ref 5.0–8.0)

## 2022-11-22 LAB — LACTIC ACID, PLASMA: Lactic Acid, Venous: 2.4 mmol/L (ref 0.5–1.9)

## 2022-11-22 MED ORDER — VANCOMYCIN HCL IN DEXTROSE 1-5 GM/200ML-% IV SOLN: 1000.0000 mg | Freq: Once | INTRAVENOUS | Status: DC

## 2022-11-22 MED ORDER — SODIUM CHLORIDE 0.9 % IV SOLN
2.0000 g | Freq: Once | INTRAVENOUS | Status: AC
  Administered 2022-11-22: 2 g via INTRAVENOUS
  Filled 2022-11-22: qty 12.5

## 2022-11-22 MED ORDER — SALINE SPRAY 0.65 % NA SOLN
1.0000 | Freq: Once | NASAL | Status: AC
  Administered 2022-11-22: 1 via NASAL
  Filled 2022-11-22: qty 44

## 2022-11-22 MED ORDER — METRONIDAZOLE 500 MG/100ML IV SOLN
500.0000 mg | Freq: Once | INTRAVENOUS | Status: AC
  Administered 2022-11-22: 500 mg via INTRAVENOUS
  Filled 2022-11-22: qty 100

## 2022-11-22 MED ORDER — ACETAMINOPHEN 325 MG PO TABS
650.0000 mg | ORAL_TABLET | Freq: Once | ORAL | Status: DC
  Filled 2022-11-22 (×2): qty 2

## 2022-11-22 MED ORDER — VANCOMYCIN HCL 2000 MG/400ML IV SOLN
2000.0000 mg | Freq: Once | INTRAVENOUS | Status: AC
  Administered 2022-11-22: 2000 mg via INTRAVENOUS
  Filled 2022-11-22: qty 400

## 2022-11-22 MED ORDER — ACETAMINOPHEN 325 MG RE SUPP
650.0000 mg | RECTAL | Status: AC
  Administered 2022-11-22: 650 mg via RECTAL
  Filled 2022-11-22: qty 2

## 2022-11-22 MED ORDER — LACTATED RINGERS IV BOLUS (SEPSIS)
1000.0000 mL | Freq: Once | INTRAVENOUS | Status: AC
  Administered 2022-11-22: 1000 mL via INTRAVENOUS

## 2022-11-22 NOTE — Progress Notes (Signed)
CODE SEPSIS - PHARMACY COMMUNICATION  **Broad Spectrum Antibiotics should be administered within 1 hour of Sepsis diagnosis**  Time Code Sepsis Called/Page Received: 9/14 @ 2208   Antibiotics Ordered: Cefepime, metronidazole , vanc   Time of 1st antibiotic administration: Cefepime 2 gm IV x 1 on 9/14 @ 2232   Additional action taken by pharmacy:   If necessary, Name of Provider/Nurse Contacted:     Quadry Kampa D ,PharmD Clinical Pharmacist  11/22/2022  10:54 PM

## 2022-11-22 NOTE — ED Notes (Signed)
Bladder scan 155 ml

## 2022-11-22 NOTE — ED Provider Notes (Signed)
[facility] Provider Note    Event Date/Time   First MD Initiated Contact with Patient 11/22/22 2201     (approximate)   History   Altered Mental Status (Stated feeling weak since 930am and has become increasingly worse. Temp 102.5Ax  HR 112, CBG 141. Altered Mental with confusion. ) and Weakness   HPI  John Booker is a 72 year old male with history of CHF, HTN, mediastinal mass presenting to the emergency department for evaluation of fever and altered mental status.  Per EMS report, patient began feeling weak this morning with worsening symptoms and development of confusion.  Axillary temp of 1-2.5 with EMS, tachycardic.  Did not receive antipyretics.  Patient able to tell me that he is in the hospital, but is unsure why he is here.  He denies pain anywhere.  Denies cough, congestion, chest pain, shortness of breath, abdominal pain, vomiting, diarrhea, dysuria.     Physical Exam   Triage Vital Signs: ED Triage Vitals  Encounter Vitals Group     BP 11/22/22 2159 (!) 161/81     Systolic BP Percentile --      Diastolic BP Percentile --      Pulse Rate 11/22/22 2156 (!) 112     Resp 11/22/22 2156 (!) 22     Temp 11/22/22 2159 (!) 103.9 F (39.9 C)     Temp Source 11/22/22 2156 Oral     SpO2 11/22/22 2201 96 %     Weight --      Height --      Head Circumference --      Peak Flow --      Pain Score --      Pain Loc --      Pain Education --      Exclude from Growth Chart --     Most recent vital signs: Vitals:   11/22/22 2159 11/22/22 2201  BP: (!) 161/81   Pulse:    Resp:    Temp: (!) 103.9 F (39.9 C)   SpO2:  96%     General: Awake, interactive  CV:  Tachycardic with regular rhythm, normal peripheral perfusion Resp:  Lungs clear, unlabored respirations.  Abd:  Soft, nondistended, no appreciable tenderness to palpation Neuro:  Oriented to self, place, knows the year is 2024, states the month is July, not oriented to situation,  normal extraocular movements, moving all extremities spontaneously and equally ED Results / Procedures / Treatments   Labs (all labs ordered are listed, but only abnormal results are displayed) Labs Reviewed  LACTIC ACID, PLASMA - Abnormal; Notable for the following components:      Result Value   Lactic Acid, Venous 2.4 (*)    All other components within normal limits  COMPREHENSIVE METABOLIC PANEL - Abnormal; Notable for the following components:   Sodium 129 (*)    Chloride 95 (*)    Glucose, Bld 135 (*)    Calcium 8.8 (*)    Total Bilirubin 2.0 (*)    All other components within normal limits  CBC WITH DIFFERENTIAL/PLATELET - Abnormal; Notable for the following components:   Neutro Abs 7.9 (*)    Lymphs Abs 0.2 (*)    All other components within normal limits  URINALYSIS, W/ REFLEX TO CULTURE (INFECTION SUSPECTED) - Abnormal; Notable for the following components:   Color, Urine YELLOW (*)    APPearance CLEAR (*)    pH 9.0 (*)    Hgb urine dipstick SMALL (*)  Protein, ur 30 (*)    All other components within normal limits  RESP PANEL BY RT-PCR (RSV, FLU A&B, COVID)  [id number]  CULTURE, BLOOD (ROUTINE X 2)  CULTURE, BLOOD (ROUTINE X 2)  LACTIC ACID, PLASMA     EKG EKG independently reviewed interpreted by myself (ER attending) demonstrates:  EKG demonstrate sinus tachycardia at a rate of 108, PR 211, QRS 86, QTc 444, significant artifact present, but no appreciable ST changes  RADIOLOGY Imaging independently reviewed and interpreted by myself demonstrates:  CT head without acute bleed Chest x-Demarion Pondexter without focal consolidation  PROCEDURES:  Critical Care performed: Yes, see critical care procedure note(s)  CRITICAL CARE Performed by: Trinna Post   Total critical care time: 32 minutes  Critical care time was exclusive of separately billable procedures and treating other patients.  Critical care was necessary to treat or prevent imminent or life-threatening  deterioration.  Critical care was time spent personally by me on the following activities: development of treatment plan with patient and/or surrogate as well as nursing, discussions with consultants, evaluation of patient's response to treatment, examination of patient, obtaining history from patient or surrogate, ordering and performing treatments and interventions, ordering and review of laboratory studies, ordering and review of radiographic studies, pulse oximetry and re-evaluation of patient's condition.   Procedures   MEDICATIONS ORDERED IN ED: Medications  lactated ringers bolus 1,000 mL (1,000 mLs Intravenous New Bag/Given 11/22/22 2231)  metroNIDAZOLE (FLAGYL) IVPB 500 mg (500 mg Intravenous New Bag/Given 11/22/22 2233)  acetaminophen (TYLENOL) tablet 650 mg (650 mg Oral Not Given 11/22/22 2234)  vancomycin (VANCOREADY) IVPB 2000 mg/400 mL (has no administration in time range)  ceFEPIme (MAXIPIME) 2 g in sodium chloride 0.9 % 100 mL IVPB (0 g Intravenous Stopped 11/22/22 2303)  acetaminophen (TYLENOL) suppository 650 mg (650 mg Rectal Given 11/22/22 2240)  sodium chloride (OCEAN) 0.65 % nasal spray 1 spray (1 spray Each Nare Given 11/22/22 2303)     IMPRESSION / MDM / ASSESSMENT AND PLAN / ED COURSE  I reviewed the triage vital signs and the nursing notes.  Differential diagnosis includes, but is not limited to, intracranial bleed, no appreciable focal deficits to suggest stroke, infection including pneumonia, UTI, medication adverse effect  Patient's presentation is most consistent with acute presentation with potential threat to life or bodily function.  72 year old male presenting to the emergency department for evaluation of fever and confusion.  Febrile and tachycardic on presentation.  Sepsis orders initiated with empiric antibiotics for unknown source including Flagyl, vancomycin, cefepime.  Also ordered for Tylenol and IV fluids.  Labs with normal white blood cell count, CMP  notable for hyponatremia with sodium of 129.  Initial lactate elevated at 2.4.  Viral swab negative.  Urinalysis and reevaluation of vitals pending at time of signout.  Signed out to oncoming physician at 2300.       FINAL CLINICAL IMPRESSION(S) / ED DIAGNOSES   Final diagnoses:  Fever, unspecified fever cause  Confusion     Rx / DC Orders   ED Discharge Orders     None        Note:  This document was prepared using Dragon voice recognition software and may include unintentional dictation errors.   Trinna Post, MD 11/22/22 [phone]

## 2022-11-22 NOTE — Progress Notes (Signed)
PHARMACY -  BRIEF ANTIBIOTIC NOTE   Pharmacy has received consult(s) for Vanc, Cefepime  from an ED provider.  The patient's profile has been reviewed for ht/wt/allergies/indication/available labs.    One time order(s) placed for Vancomycin 2 gm IV X 1 and cefepime 2 gm IV X 1   Further antibiotics/pharmacy consults should be ordered by admitting physician if indicated.                       Thank you, Airyanna Dipalma D 11/22/2022  10:14 PM

## 2022-11-22 NOTE — Progress Notes (Signed)
Pt being followed by [facility] for Sepsis protocol. 

## 2022-11-23 ENCOUNTER — Emergency Department: Payer: Medicare Other

## 2022-11-23 ENCOUNTER — Encounter: Payer: Self-pay | Admitting: Internal Medicine

## 2022-11-23 ENCOUNTER — Other Ambulatory Visit: Payer: Self-pay

## 2022-11-23 DIAGNOSIS — I110 Hypertensive heart disease with heart failure: Secondary | ICD-10-CM | POA: Diagnosis present

## 2022-11-23 DIAGNOSIS — Z79899 Other long term (current) drug therapy: Secondary | ICD-10-CM | POA: Diagnosis not present

## 2022-11-23 DIAGNOSIS — Z888 Allergy status to other drugs, medicaments and biological substances status: Secondary | ICD-10-CM | POA: Diagnosis not present

## 2022-11-23 DIAGNOSIS — R066 Hiccough: Secondary | ICD-10-CM | POA: Diagnosis present

## 2022-11-23 DIAGNOSIS — M542 Cervicalgia: Secondary | ICD-10-CM | POA: Diagnosis present

## 2022-11-23 DIAGNOSIS — G8929 Other chronic pain: Secondary | ICD-10-CM | POA: Diagnosis present

## 2022-11-23 DIAGNOSIS — Z9841 Cataract extraction status, right eye: Secondary | ICD-10-CM | POA: Diagnosis not present

## 2022-11-23 DIAGNOSIS — Z1152 Encounter for screening for COVID-19: Secondary | ICD-10-CM | POA: Diagnosis not present

## 2022-11-23 DIAGNOSIS — R519 Headache, unspecified: Secondary | ICD-10-CM | POA: Diagnosis present

## 2022-11-23 DIAGNOSIS — R410 Disorientation, unspecified: Secondary | ICD-10-CM | POA: Diagnosis present

## 2022-11-23 DIAGNOSIS — E871 Hypo-osmolality and hyponatremia: Secondary | ICD-10-CM | POA: Diagnosis present

## 2022-11-23 DIAGNOSIS — Z8249 Family history of ischemic heart disease and other diseases of the circulatory system: Secondary | ICD-10-CM | POA: Diagnosis not present

## 2022-11-23 DIAGNOSIS — R7881 Bacteremia: Secondary | ICD-10-CM | POA: Diagnosis not present

## 2022-11-23 DIAGNOSIS — Z961 Presence of intraocular lens: Secondary | ICD-10-CM | POA: Diagnosis present

## 2022-11-23 DIAGNOSIS — N200 Calculus of kidney: Secondary | ICD-10-CM | POA: Diagnosis present

## 2022-11-23 DIAGNOSIS — W19XXXA Unspecified fall, initial encounter: Secondary | ICD-10-CM | POA: Diagnosis present

## 2022-11-23 DIAGNOSIS — E872 Acidosis, unspecified: Secondary | ICD-10-CM | POA: Diagnosis present

## 2022-11-23 DIAGNOSIS — G9341 Metabolic encephalopathy: Secondary | ICD-10-CM | POA: Diagnosis present

## 2022-11-23 DIAGNOSIS — R509 Fever, unspecified: Secondary | ICD-10-CM | POA: Diagnosis not present

## 2022-11-23 DIAGNOSIS — R222 Localized swelling, mass and lump, trunk: Secondary | ICD-10-CM | POA: Diagnosis present

## 2022-11-23 DIAGNOSIS — Z87891 Personal history of nicotine dependence: Secondary | ICD-10-CM | POA: Diagnosis not present

## 2022-11-23 DIAGNOSIS — A419 Sepsis, unspecified organism: Secondary | ICD-10-CM

## 2022-11-23 DIAGNOSIS — A4159 Other Gram-negative sepsis: Secondary | ICD-10-CM | POA: Diagnosis present

## 2022-11-23 DIAGNOSIS — R6520 Severe sepsis without septic shock: Secondary | ICD-10-CM | POA: Diagnosis present

## 2022-11-23 DIAGNOSIS — N400 Enlarged prostate without lower urinary tract symptoms: Secondary | ICD-10-CM | POA: Diagnosis present

## 2022-11-23 DIAGNOSIS — B957 Other staphylococcus as the cause of diseases classified elsewhere: Secondary | ICD-10-CM | POA: Diagnosis not present

## 2022-11-23 DIAGNOSIS — E876 Hypokalemia: Secondary | ICD-10-CM | POA: Diagnosis present

## 2022-11-23 DIAGNOSIS — Z8619 Personal history of other infectious and parasitic diseases: Secondary | ICD-10-CM | POA: Diagnosis not present

## 2022-11-23 DIAGNOSIS — I5032 Chronic diastolic (congestive) heart failure: Secondary | ICD-10-CM | POA: Diagnosis present

## 2022-11-23 DIAGNOSIS — Z66 Do not resuscitate: Secondary | ICD-10-CM | POA: Diagnosis present

## 2022-11-23 LAB — BASIC METABOLIC PANEL
Anion gap: 9 (ref 5–15)
BUN: 17 mg/dL (ref 8–23)
CO2: 22 mmol/L (ref 22–32)
Calcium: 8.2 mg/dL — ABNORMAL LOW (ref 8.9–10.3)
Chloride: 97 mmol/L — ABNORMAL LOW (ref 98–111)
Creatinine, Ser: 0.96 mg/dL (ref 0.61–1.24)
GFR, Estimated: >60 mL/min (ref >60)
Glucose, Bld: 124 mg/dL — ABNORMAL HIGH (ref 70–99)
Potassium: 4.4 mmol/L (ref 3.5–5.1)
Sodium: 128 mmol/L — ABNORMAL LOW (ref 135–145)

## 2022-11-23 LAB — RESPIRATORY PANEL BY PCR

## 2022-11-23 LAB — BLOOD CULTURE ID PANEL (REFLEXED) - BCID2
A.calcoaceticus-baumannii: NOT DETECTED
A.calcoaceticus-baumannii: NOT DETECTED
Bacteroides fragilis: NOT DETECTED
Bacteroides fragilis: NOT DETECTED
CTX-M ESBL: NOT DETECTED
Candida albicans: NOT DETECTED
Candida albicans: NOT DETECTED
Candida auris: NOT DETECTED
Candida auris: NOT DETECTED
Candida glabrata: NOT DETECTED
Candida glabrata: NOT DETECTED
Candida krusei: NOT DETECTED
Candida krusei: NOT DETECTED
Candida parapsilosis: NOT DETECTED
Candida parapsilosis: NOT DETECTED
Candida tropicalis: NOT DETECTED
Candida tropicalis: NOT DETECTED
Carbapenem resist OXA 48 LIKE: NOT DETECTED
Carbapenem resistance IMP: NOT DETECTED
Carbapenem resistance KPC: NOT DETECTED
Carbapenem resistance NDM: NOT DETECTED
Carbapenem resistance VIM: NOT DETECTED
Cryptococcus neoformans/gattii: NOT DETECTED
Cryptococcus neoformans/gattii: NOT DETECTED
Enterobacter cloacae complex: NOT DETECTED
Enterobacter cloacae complex: NOT DETECTED
Enterobacterales: DETECTED — AB
Enterobacterales: NOT DETECTED
Enterococcus Faecium: NOT DETECTED
Enterococcus Faecium: NOT DETECTED
Enterococcus faecalis: NOT DETECTED
Enterococcus faecalis: NOT DETECTED
Escherichia coli: NOT DETECTED
Escherichia coli: NOT DETECTED
Haemophilus influenzae: NOT DETECTED
Haemophilus influenzae: NOT DETECTED
Klebsiella aerogenes: NOT DETECTED
Klebsiella aerogenes: NOT DETECTED
Klebsiella oxytoca: DETECTED — AB
Klebsiella oxytoca: NOT DETECTED
Klebsiella pneumoniae: NOT DETECTED
Klebsiella pneumoniae: NOT DETECTED
Listeria monocytogenes: NOT DETECTED
Listeria monocytogenes: NOT DETECTED
Methicillin resistance mecA/C: DETECTED — AB
Neisseria meningitidis: NOT DETECTED
Neisseria meningitidis: NOT DETECTED
Proteus species: NOT DETECTED
Proteus species: NOT DETECTED
Pseudomonas aeruginosa: NOT DETECTED
Pseudomonas aeruginosa: NOT DETECTED
Salmonella species: NOT DETECTED
Salmonella species: NOT DETECTED
Serratia marcescens: NOT DETECTED
Serratia marcescens: NOT DETECTED
Staphylococcus aureus (BCID): NOT DETECTED
Staphylococcus aureus (BCID): NOT DETECTED
Staphylococcus epidermidis: DETECTED — AB
Staphylococcus epidermidis: NOT DETECTED
Staphylococcus lugdunensis: NOT DETECTED
Staphylococcus lugdunensis: NOT DETECTED
Staphylococcus species: DETECTED — AB
Staphylococcus species: NOT DETECTED
Stenotrophomonas maltophilia: NOT DETECTED
Stenotrophomonas maltophilia: NOT DETECTED
Streptococcus agalactiae: NOT DETECTED
Streptococcus agalactiae: NOT DETECTED
Streptococcus pneumoniae: NOT DETECTED
Streptococcus pneumoniae: NOT DETECTED
Streptococcus pyogenes: NOT DETECTED
Streptococcus pyogenes: NOT DETECTED
Streptococcus species: NOT DETECTED
Streptococcus species: NOT DETECTED

## 2022-11-23 LAB — CBC
HCT: 45.2 % (ref 39.0–52.0)
Hemoglobin: 15.4 g/dL (ref 13.0–17.0)
MCH: 31.6 pg (ref 26.0–34.0)
MCHC: 34.1 g/dL (ref 30.0–36.0)
MCV: 92.6 fL (ref 80.0–100.0)
Platelets: 148 10*3/uL — ABNORMAL LOW (ref 150–400)
RBC: 4.88 MIL/uL (ref 4.22–5.81)
RDW: 12.9 % (ref 11.5–15.5)
WBC: 7.4 10*3/uL (ref 4.0–10.5)
nRBC: 0 % (ref 0.0–0.2)

## 2022-11-23 LAB — PROCALCITONIN: Procalcitonin: 19.32 ng/mL

## 2022-11-23 LAB — SODIUM, URINE, RANDOM: Sodium, Ur: 130 mmol/L

## 2022-11-23 LAB — OSMOLALITY, URINE: Osmolality, Ur: 530 mosm/kg (ref 300–900)

## 2022-11-23 LAB — OSMOLALITY: Osmolality: 275 mosm/kg (ref 275–295)

## 2022-11-23 LAB — LACTIC ACID, PLASMA: Lactic Acid, Venous: 1.7 mmol/L (ref 0.5–1.9)

## 2022-11-23 LAB — CORTISOL-AM, BLOOD: Cortisol - AM: 18.9 ug/dL (ref 6.7–22.6)

## 2022-11-23 LAB — PROTIME-INR
INR: 1.1 (ref 0.8–1.2)
Prothrombin Time: 14.6 s (ref 11.4–15.2)

## 2022-11-23 MED ORDER — LACTATED RINGERS IV SOLN
150.0000 mL/h | INTRAVENOUS | Status: DC
  Administered 2022-11-23: 150 mL/h via INTRAVENOUS

## 2022-11-23 MED ORDER — ENOXAPARIN SODIUM 40 MG/0.4ML IJ SOSY
40.0000 mg | PREFILLED_SYRINGE | INTRAMUSCULAR | Status: DC
  Administered 2022-11-23 – 2022-11-26 (×4): 40 mg via SUBCUTANEOUS
  Filled 2022-11-23 (×4): qty 0.4

## 2022-11-23 MED ORDER — ONDANSETRON HCL 4 MG PO TABS: 4.0000 mg | ORAL_TABLET | Freq: Four times a day (QID) | ORAL | Status: DC | PRN

## 2022-11-23 MED ORDER — VANCOMYCIN HCL 2000 MG/400ML IV SOLN
2000.0000 mg | INTRAVENOUS | Status: DC
  Administered 2022-11-23: 2000 mg via INTRAVENOUS
  Filled 2022-11-23: qty 400

## 2022-11-23 MED ORDER — HYDROCODONE-ACETAMINOPHEN 5-325 MG PO TABS
1.0000 | ORAL_TABLET | ORAL | Status: DC | PRN
  Administered 2022-11-26: 1 via ORAL
  Filled 2022-11-23 (×2): qty 1

## 2022-11-23 MED ORDER — ACETAMINOPHEN 500 MG PO TABS
1000.0000 mg | ORAL_TABLET | Freq: Once | ORAL | Status: AC
  Administered 2022-11-23: 1000 mg via ORAL
  Filled 2022-11-23: qty 2

## 2022-11-23 MED ORDER — GABAPENTIN 100 MG PO CAPS
100.0000 mg | ORAL_CAPSULE | Freq: Three times a day (TID) | ORAL | Status: DC
  Administered 2022-11-23 – 2022-11-26 (×9): 100 mg via ORAL
  Filled 2022-11-23 (×9): qty 1

## 2022-11-23 MED ORDER — IOHEXOL 300 MG/ML  SOLN
100.0000 mL | Freq: Once | INTRAMUSCULAR | Status: AC | PRN
  Administered 2022-11-23: 100 mL via INTRAVENOUS

## 2022-11-23 MED ORDER — SODIUM CHLORIDE 0.9 % IV SOLN
25.0000 mg | Freq: Once | INTRAVENOUS | Status: AC
  Administered 2022-11-23: 25 mg via INTRAVENOUS
  Filled 2022-11-23: qty 1

## 2022-11-23 MED ORDER — LOSARTAN POTASSIUM 50 MG PO TABS
100.0000 mg | ORAL_TABLET | Freq: Every day | ORAL | Status: DC
  Administered 2022-11-23 – 2022-11-25 (×3): 100 mg via ORAL
  Filled 2022-11-23 (×3): qty 2

## 2022-11-23 MED ORDER — SODIUM CHLORIDE 0.9 % IV SOLN
2.0000 g | Freq: Three times a day (TID) | INTRAVENOUS | Status: DC
  Administered 2022-11-23 (×2): 2 g via INTRAVENOUS
  Filled 2022-11-23 (×3): qty 12.5

## 2022-11-23 MED ORDER — CHLORHEXIDINE GLUCONATE CLOTH 2 % EX PADS: 6.0000 | MEDICATED_PAD | Freq: Every day | CUTANEOUS | Status: DC

## 2022-11-23 MED ORDER — ACETAMINOPHEN 325 MG PO TABS
650.0000 mg | ORAL_TABLET | Freq: Four times a day (QID) | ORAL | Status: DC | PRN
  Administered 2022-11-23 – 2022-11-24 (×2): 650 mg via ORAL
  Filled 2022-11-23 (×2): qty 2

## 2022-11-23 MED ORDER — ENSURE ENLIVE PO LIQD
237.0000 mL | Freq: Two times a day (BID) | ORAL | Status: DC
  Administered 2022-11-23 – 2022-11-24 (×3): 237 mL via ORAL

## 2022-11-23 MED ORDER — ACETAMINOPHEN 650 MG RE SUPP: 650.0000 mg | Freq: Four times a day (QID) | RECTAL | Status: DC | PRN

## 2022-11-23 MED ORDER — METRONIDAZOLE 500 MG/100ML IV SOLN
500.0000 mg | Freq: Two times a day (BID) | INTRAVENOUS | Status: DC
  Administered 2022-11-23 (×2): 500 mg via INTRAVENOUS
  Filled 2022-11-23 (×2): qty 100

## 2022-11-23 MED ORDER — ONDANSETRON HCL 4 MG/2ML IJ SOLN: 4.0000 mg | Freq: Four times a day (QID) | INTRAMUSCULAR | Status: DC | PRN

## 2022-11-23 MED ORDER — SALINE SPRAY 0.65 % NA SOLN
1.0000 | NASAL | Status: DC | PRN
  Filled 2022-11-23: qty 44

## 2022-11-23 MED ORDER — HYDRALAZINE HCL 20 MG/ML IJ SOLN
10.0000 mg | Freq: Four times a day (QID) | INTRAMUSCULAR | Status: DC | PRN
  Administered 2022-11-25 – 2022-11-26 (×2): 10 mg via INTRAVENOUS
  Filled 2022-11-23 (×2): qty 1

## 2022-11-23 NOTE — ED Notes (Signed)
Provider notified of BP 184/91

## 2022-11-23 NOTE — Assessment & Plan Note (Signed)
Previously followed by oncology, not seen since September 2023

## 2022-11-23 NOTE — H&P (Signed)
History and Physical    Patient: John Booker GNF:[id number] DOB: Aug 06, 1950 DOA: 11/22/2022 DOS: the patient was seen and examined on 11/23/2022 PCP: Marisue Ivan, MD  Patient coming from: Home  Chief Complaint:  Chief Complaint  Patient presents with   Altered Mental Status    Stated feeling weak since 930am and has become increasingly worse. Temp 102.5Ax  HR 112, CBG 141. Altered Mental with confusion.    Weakness    HPI: John Booker is a 72 y.o. male with medical history significant for Diastolic CHF (EF 60 to 65%, G1 DD, 11/2021), HTN, intractable hiccups followed by GI, mediastinal mass followed by oncology last seen 10/2021,  MSSA bacteremia 11/2021 from foot wounds (MRI at that time showed fluid collection L5-S1-septic arthritis difficult to exclude), who presents to the ED with fever, weakness and altered mental status that started the day of arrival.  Patient tells me that he was brought to the hospital because he fell.  States he does not hurt anywhere yet endorses headache to the back of his head which he attributes to 2 herniated disks in his neck.  Chart review reveals a patient was seen by his PCP 10/30/2022 with concerns for cognitive deficit but was otherwise at baseline. ED course and data review: Tmax 103.9 with pulse 112, respirations in the mid 20s, BP 155/82 and O2 sat in the high 90s on room air. Labs: WBC normal but with lactic acid 2.4..  Sodium 129, total bilirubin 2.0.  Urinalysis without infection.  Respiratory viral panel negative. EKG, personally viewed and interpreted showing sinus tachycardia at 108 with no acute ST-T wave changes. CT chest abdomen and pelvis with no acute cardiopulmonary or intra-abdominal findings.  Does show cholelithiasis with nonobstructing bilateral renal calculi without hydronephrosis. Head CT nonacute. Patient started on broad-spectrum antibiotics with cefepime Flagyl and vancomycin and started on sepsis fluids Hospitalist  consulted for admission.     Past Medical History:  Diagnosis Date   Chronic hiccups    intermittent   Hypertension    Memory changes    Past Surgical History:  Procedure Laterality Date   CATARACT EXTRACTION W/PHACO Left 11/13/2022   Procedure: CATARACT EXTRACTION PHACO AND INTRAOCULAR LENS PLACEMENT (IOC) LEFT OMIDRIA   13.58  01:23.3;  Surgeon: Estanislado Pandy, MD;  Location: [facility];  Service: Ophthalmology;  Laterality: Left;   ESOPHAGOGASTRODUODENOSCOPY (EGD) WITH PROPOFOL N/A 05/26/2018   Procedure: ESOPHAGOGASTRODUODENOSCOPY (EGD) WITH PROPOFOL;  Surgeon: Pasty Spillers, MD;  Location: [facility];  Service: Endoscopy;  Laterality: N/A;   ESOPHAGOGASTRODUODENOSCOPY (EGD) WITH PROPOFOL N/A 12/28/2020   Procedure: ESOPHAGOGASTRODUODENOSCOPY (EGD) WITH PROPOFOL;  Surgeon: Jaynie Collins, DO;  Location: [facility];  Service: Gastroenterology;  Laterality: N/A;   ESOPHAGOGASTRODUODENOSCOPY (EGD) WITH PROPOFOL N/A 10/09/2021   Procedure: ESOPHAGOGASTRODUODENOSCOPY (EGD) WITH PROPOFOL;  Surgeon: Wyline Mood, MD;  Location: [facility];  Service: Gastroenterology;  Laterality: N/A;   INCISION AND DRAINAGE PERIRECTAL ABSCESS N/A 11/19/2015   Procedure: IRRIGATION AND DEBRIDEMENT PERIRECTAL ABSCESS and debridement of scrotal abscess;  Surgeon: Lattie Haw, MD;  Location: [facility];  Service: General;  Laterality: N/A;   INCISION AND DRAINAGE PERIRECTAL ABSCESS N/A 11/20/2015   Procedure: IRRIGATION AND DEBRIDEMENT PERIRECTAL ABSCESS / WITH DRESSING CHANGE;  Surgeon: Lattie Haw, MD;  Location: [facility];  Service: General;  Laterality: N/A;  peri rectal site   INCISION AND DRAINAGE PERIRECTAL ABSCESS N/A 11/21/2015   Procedure: IRRIGATION AND DEBRIDEMENT PERIRECTAL ABSCESS WITH DRESSING CHANGE;  Surgeon: Adah Salvage  Excell Seltzer, MD;  Location: [facility];  Service: General;  Laterality: N/A;   MINOR HEMORRHOIDECTOMY  1989   TEE WITHOUT CARDIOVERSION N/A  11/20/2021   Procedure: TRANSESOPHAGEAL ECHOCARDIOGRAM (TEE);  Surgeon: Lamar Blinks, MD;  Location: [facility];  Service: Cardiovascular;  Laterality: N/A;  8:00am   Social History:  reports that he quit smoking about 7 months ago. His smoking use included cigarettes. He started smoking about 38 years ago. He has a 19 pack-year smoking history. He has quit using smokeless tobacco. He reports that he does not currently use alcohol. He reports that he does not use drugs.  Allergies  Allergen Reactions   Adalat [Nifedipine]     Foot swelling.    Amlodipine Other (See Comments)    Leg swelling    Family History  Problem Relation Age of Onset   Hypertension Father     Prior to Admission medications   Medication Sig Start Date End Date Taking? Authorizing Provider  acetaminophen (TYLENOL) 325 MG tablet Take 2 tablets (650 mg total) by mouth every 6 (six) hours as needed for mild pain or fever. Patient not taking: Reported on 11/13/2022 06/27/21   Arnetha Courser, MD  aspirin EC 81 MG EC tablet Take 1 tablet (81 mg total) by mouth daily. Swallow whole. Patient not taking: Reported on 11/04/2022 06/27/21   Arnetha Courser, MD  baclofen (LIORESAL) 10 MG tablet Take 10 mg by mouth 2 (two) times daily. Patient not taking: Reported on 11/04/2022    [provider]  escitalopram (LEXAPRO) 5 MG tablet Take 5 mg by mouth daily. Patient not taking: Reported on 11/04/2022 02/07/22   [provider]  furosemide (LASIX) 40 MG tablet Take 40 mg by mouth daily. Patient not taking: Reported on 11/04/2022 11/07/21   [provider]  gabapentin (NEURONTIN) 100 MG capsule TAKE 1 CAPSULE BY MOUTH 3 TIMES DAILY 08/25/22   Wyline Mood, MD  losartan (COZAAR) 100 MG tablet Take 1 tablet (100 mg total) by mouth daily. 05/13/21   Karamalegos, Netta Neat, DO  Magnesium 200 MG CHEW Chew 200 mg by mouth daily. Patient not taking: Reported on 11/04/2022 02/19/22   Arnetha Courser, MD  Multiple Vitamin  (MULTIVITAMIN WITH MINERALS) TABS tablet Take 1 tablet by mouth daily. Patient not taking: Reported on 11/04/2022 12/31/20   Esaw Grandchild A, DO  ondansetron (ZOFRAN) 4 MG tablet Take 1 tablet (4 mg total) by mouth every 8 (eight) hours as needed for nausea or vomiting. 10/01/22   Wyline Mood, MD  pantoprazole (PROTONIX) 40 MG tablet TAKE ONE (1) TABLET BY MOUTH TWO TIMES PER DAY 05/23/22   Wyline Mood, MD  potassium chloride SA (KLOR-CON M) 20 MEQ tablet Take 1 tablet (20 mEq total) by mouth daily. Patient not taking: Reported on 11/04/2022 02/19/22   Arnetha Courser, MD  sodium chloride (OCEAN) 0.65 % SOLN nasal spray Place 1 spray into both nostrils as needed for congestion (nose irritation). 02/19/22   Arnetha Courser, MD  vitamin B-12 1000 MCG tablet Take 1 tablet (1,000 mcg total) by mouth daily. Patient not taking: Reported on 11/04/2022 12/31/20   Pennie Banter, DO    Physical Exam: Vitals:   11/22/22 2201 11/22/22 2330 11/23/22 0100 11/23/22 0139  BP:  (!) 155/82    Pulse:  (!) 104  84  Resp:  (!) 26  18  Temp:   100.1 F (37.8 C)   TempSrc:   Oral   SpO2: 96% 97%  99%   Physical  Exam Vitals and nursing note reviewed.  Constitutional:      General: He is not in acute distress.    Comments: Awake and alert oriented to person time and place  HENT:     Head: Normocephalic and atraumatic.  Neck:     Comments: Patient not allowing his neck to be examined stating that he has 2 herniated disks  Cardiovascular:     Rate and Rhythm: Regular rhythm. Tachycardia present.     Heart sounds: Normal heart sounds.  Pulmonary:     Effort: Tachypnea present.     Breath sounds: Normal breath sounds.  Abdominal:     Palpations: Abdomen is soft.     Tenderness: There is no abdominal tenderness.  Skin:    Comments: Multiple bruises on extremities Scabs on toes.  Old bruises on knees See pics below  Neurological:     General: No focal deficit present.     Mental Status: He is oriented to  person, place, and time.  Psychiatric:     Comments: Patient not cooperating with exam.                Labs on Admission: I have personally reviewed following labs and imaging studies  CBC: Recent Labs  Lab 11/22/22 2157  WBC 8.3  NEUTROABS 7.9*  HGB 15.6  HCT 46.0  MCV 93.1  PLT 180   Basic Metabolic Panel: Recent Labs  Lab 11/22/22 2157  NA 129*  K 4.1  CL 95*  CO2 22  GLUCOSE 135*  BUN 18  CREATININE 1.15  CALCIUM 8.8*   GFR: Estimated Creatinine Clearance: 68.4 mL/min (by C-G formula based on SCr of 1.15 mg/dL). Liver Function Tests: Recent Labs  Lab 11/22/22 2157  AST 30  ALT 24  ALKPHOS 67  BILITOT 2.0*  PROT 6.8  ALBUMIN 3.6   No results for input(s): "LIPASE", "AMYLASE" in the last 168 hours. No results for input(s): "AMMONIA" in the last 168 hours. Coagulation Profile: No results for input(s): "INR", "PROTIME" in the last 168 hours. Cardiac Enzymes: No results for input(s): "CKTOTAL", "CKMB", "CKMBINDEX", "TROPONINI" in the last 168 hours. BNP (last 3 results) No results for input(s): "PROBNP" in the last 8760 hours. HbA1C: No results for input(s): "HGBA1C" in the last 72 hours. CBG: No results for input(s): "GLUCAP" in the last 168 hours. Lipid Profile: No results for input(s): "CHOL", "HDL", "LDLCALC", "TRIG", "CHOLHDL", "LDLDIRECT" in the last 72 hours. Thyroid Function Tests: No results for input(s): "TSH", "T4TOTAL", "FREET4", "T3FREE", "THYROIDAB" in the last 72 hours. Anemia Panel: No results for input(s): "VITAMINB12", "FOLATE", "FERRITIN", "TIBC", "IRON", "RETICCTPCT" in the last 72 hours. Urine analysis:    Component Value Date/Time   COLORURINE YELLOW (A) 11/22/2022 2157   APPEARANCEUR CLEAR (A) 11/22/2022 2157   APPEARANCEUR Clear 10/29/2021 1034   LABSPEC 1.013 11/22/2022 2157   PHURINE 9.0 (H) 11/22/2022 2157   GLUCOSEU NEGATIVE 11/22/2022 2157   HGBUR SMALL (A) 11/22/2022 2157   BILIRUBINUR NEGATIVE 11/22/2022  2157   BILIRUBINUR Negative 10/29/2021 1034   KETONESUR NEGATIVE 11/22/2022 2157   PROTEINUR 30 (A) 11/22/2022 2157   NITRITE NEGATIVE 11/22/2022 2157   LEUKOCYTESUR NEGATIVE 11/22/2022 2157    Radiological Exams on Admission: CT CHEST ABDOMEN PELVIS W CONTRAST  Result Date: 11/23/2022 CLINICAL DATA:  Sepsis EXAM: CT CHEST, ABDOMEN, AND PELVIS WITH CONTRAST TECHNIQUE: Multidetector CT imaging of the chest, abdomen and pelvis was performed following the standard protocol during bolus administration of intravenous contrast. RADIATION DOSE REDUCTION:  This exam was performed according to the departmental dose-optimization program which includes automated exposure control, adjustment of the mA and/or kV according to patient size and/or use of iterative reconstruction technique. CONTRAST:  OMNIPAQUE IOHEXOL 300 MG/ML  SOLN COMPARISON:  CT abdomen/pelvis dated 02/18/2022. PET-CT dated 11/12/2021. FINDINGS: CT CHEST FINDINGS Cardiovascular: Heart is normal in size.  No pericardial effusion. No evidence of thoracic aortic aneurysm. Atherosclerotic calcifications of the arch. Mild coronary atherosclerosis of the right coronary artery. Mediastinum/Nodes: No suspicious mediastinal lymphadenopathy. Fluid in the mid/distal esophagus, nonspecific but possibly suggesting gastroesophageal reflux. Visualized thyroid is unremarkable. Lungs/Pleura: Lungs are clear. No suspicious pulmonary nodules. No focal consolidation. No pleural effusion or pneumothorax. Musculoskeletal: Lobulated low-density left paraspinal lesions (for example, series 3/image 81), similar to priors, non FDG avid and favoring extramedullary hematopoiesis. Degenerative changes of the thoracic spine. CT ABDOMEN PELVIS FINDINGS Hepatobiliary: 13 mm cyst versus hemangioma in the left hepatic lobe (series 3/image 65), unchanged. Additional small scattered probable hepatic cysts. Gallbladder is notable for tiny layering gallstones (series 3/image 38),  without associated inflammatory changes. No intrahepatic or extrahepatic duct dilatation. Pancreas: Within normal limits. Spleen: Within normal limits. Adrenals/Urinary Tract: Adrenal glands are within normal limits. 14 mm cyst in the posterior right lower kidney (series 8/image 18), measuring simple fluid density, benign. No follow-up is recommended. Two nonobstructing right renal calculi measuring up to 3 mm in the interpolar region (series 3/image 87). Punctate nonobstructing left lower pole renal calculus (series 3/image 89). No hydronephrosis. Bladder is decompressed. Stomach/Bowel: Stomach is within normal limits. No evidence of bowel obstruction. Normal appendix (series 3/image 106). Sigmoid diverticulosis, without evidence of diverticulitis. Vascular/Lymphatic: No evidence of abdominal aortic aneurysm. Atherosclerotic calcifications of the abdominal aorta and branch vessels, although vessels remain patent. No suspicious abdominopelvic lymphadenopathy. Reproductive: Prostatomegaly, with enlargement of the central gland indenting the base of the bladder, suggesting BPH. Other: No abdominopelvic ascites. Musculoskeletal: Degenerative changes of the lumbar spine. IMPRESSION: No acute cardiopulmonary disease. Cholelithiasis, without associated inflammatory changes. Nonobstructing bilateral renal calculi. No hydronephrosis. Additional ancillary findings as above. Electronically Signed   By: Charline Bills M.D.   On: 11/23/2022 00:55   CT Head Wo Contrast  Result Date: 11/22/2022 CLINICAL DATA:  Altered mental status, code sepsis EXAM: CT HEAD WITHOUT CONTRAST TECHNIQUE: Contiguous axial images were obtained from the base of the skull through the vertex without intravenous contrast. RADIATION DOSE REDUCTION: This exam was performed according to the departmental dose-optimization program which includes automated exposure control, adjustment of the mA and/or kV according to patient size and/or use of iterative  reconstruction technique. COMPARISON:  MRI brain dated 02/13/2022 FINDINGS: Brain: No evidence of acute infarction, hemorrhage, hydrocephalus, extra-axial collection or mass lesion/mass effect. Subcortical white matter and periventricular small vessel ischemic changes. Vascular: No hyperdense vessel or unexpected calcification. Skull: Normal. Negative for fracture or focal lesion. Sinuses/Orbits: The visualized paranasal sinuses are essentially clear. The mastoid air cells are unopacified. Other: None. IMPRESSION: No acute intracranial abnormality. Small vessel ischemic changes. Electronically Signed   By: Charline Bills M.D.   On: 11/22/2022 22:30   DG Chest Port 1 View  Result Date: 11/22/2022 CLINICAL DATA:  Questionable sepsis-evaluate for abnormality. Altered mental status. EXAM: PORTABLE CHEST 1 VIEW COMPARISON:  02/18/2022 FINDINGS: Stable cardiomediastinal silhouette. Aortic atherosclerotic calcification. No focal consolidation, pleural effusion, or pneumothorax. No displaced rib fractures. IMPRESSION: No acute process. Electronically Signed   By: Minerva Fester M.D.   On: 11/22/2022 22:29     Data Reviewed: Relevant  notes from primary care and specialist visits, past discharge summaries as available in EHR, including Care Everywhere. Prior diagnostic testing as pertinent to current admission diagnoses Updated medications and problem lists for reconciliation ED course, including vitals, labs, imaging, treatment and response to treatment Triage notes, nursing and pharmacy notes and ED provider's notes Notable results as noted in HPI   Assessment and Plan: * Sepsis (HCC) History of MSSA bacteremia September 2023 secondary to foot wounds Headache Patient meeting sepsis criteria with fever, tachycardia and tachypnea and lactic acidosis, normal WBC Endorses chronic neck pain but new headache following a fall CT chest abdomen and pelvis nonrevealing Had MRI LS spine September 2023 with  fluid collection but no back pain on examination so low suspicion for this as a source Continue broad-spectrum antibiotics with Rocephin and Vanco and Flagyl Continue sepsis fluids Follow cultures Consider ID consult Spoke with patient about a spinal tap but he refuses Will keep n.p.o. and hold Lovenox in case he changes his mind in the next few hours  Acute metabolic encephalopathy Secondary to sepsis.  Meningitis not ruled out Neurologic checks Fall and aspiration precautions  Hyponatremia Sodium 129 IV hydration and monitor will get urine sodium and osmolality and serum osmolality  Hypokalemia Replete and monitor Pharmacy consult for electrolyte management  Mediastinal mass Previously followed by oncology, not seen since September 2023  Essential hypertension Continue losartan  Chronic diastolic CHF (congestive heart failure) (HCC) Continue losartan.  Will hold furosemide for now with sepsis diagnosis     DVT prophylaxis: Lovenox  Consults: none  Advance Care Planning:   Code Status: Prior   Family Communication: none  Disposition Plan: Back to previous home environment  Severity of Illness: The appropriate patient status for this patient is INPATIENT. Inpatient status is judged to be reasonable and necessary in order to provide the required intensity of service to ensure the patient's safety. The patient's presenting symptoms, physical exam findings, and initial radiographic and laboratory data in the context of their chronic comorbidities is felt to place them at high risk for further clinical deterioration. Furthermore, it is not anticipated that the patient will be medically stable for discharge from the hospital within 2 midnights of admission.   * I certify that at the point of admission it is my clinical judgment that the patient will require inpatient hospital care spanning beyond 2 midnights from the point of admission due to high intensity of service, high  risk for further deterioration and high frequency of surveillance required.*  Author: Andris Baumann, MD 11/23/2022 3:23 AM  For on call review [email or web address].

## 2022-11-23 NOTE — Assessment & Plan Note (Addendum)
Secondary to sepsis.  Meningitis not ruled out Neurologic checks Fall and aspiration precautions

## 2022-11-23 NOTE — Assessment & Plan Note (Signed)
Replete and monitor Pharmacy consult for electrolyte management

## 2022-11-23 NOTE — Assessment & Plan Note (Signed)
Continue losartan.

## 2022-11-23 NOTE — Assessment & Plan Note (Signed)
Continue losartan.  Will hold furosemide for now with sepsis diagnosis

## 2022-11-23 NOTE — Progress Notes (Signed)
Pharmacy Antibiotic Note  John Booker is a 72 y.o. male admitted on 11/22/2022 with sepsis.  Pharmacy has been consulted for Vanc, Cefepime dosing.  Plan: Cefepime 2 gm IV X 1 given in ED on 9/14 @ 2232. Cefepime 2 gm IV Q8H ordered to start on 9/15 @ 0700.  Vancomycin 2 gm IV X 1 given in ED on 9/14 @ 2344. Vancomycin 2 gm IV Q24H ordered to start on 9/16 @ 0000.  AUC =480.2 Vanc trough = 9.9      Temp (24hrs), Avg:102 F (38.9 C), Min:100.1 F (37.8 C), Max:103.9 F (39.9 C)  Recent Labs  Lab 11/22/22 2157 11/23/22 0320  WBC 8.3  --   CREATININE 1.15  --   LATICACIDVEN 2.4* 1.7    Estimated Creatinine Clearance: 68.4 mL/min (by C-G formula based on SCr of 1.15 mg/dL).    Allergies  Allergen Reactions   Adalat [Nifedipine]     Foot swelling.    Amlodipine Other (See Comments)    Leg swelling    Antimicrobials this admission:   >>    >>   Dose adjustments this admission:   Microbiology results:  BCx:   UCx:    Sputum:    MRSA PCR:   Thank you for allowing pharmacy to be a part of this patient's care.  Lathaniel Legate D 11/23/2022 4:35 AM

## 2022-11-23 NOTE — Plan of Care (Signed)

## 2022-11-23 NOTE — Assessment & Plan Note (Addendum)
History of MSSA bacteremia September 2023 secondary to foot wounds Headache Patient meeting sepsis criteria with fever, tachycardia and tachypnea and lactic acidosis, normal WBC Endorses chronic neck pain but new headache following a fall CT chest abdomen and pelvis nonrevealing Had MRI LS spine September 2023 with fluid collection but no back pain on examination so low suspicion for this as a source Continue broad-spectrum antibiotics with Rocephin and Vanco and Flagyl Continue sepsis fluids Follow cultures Consider ID consult Spoke with patient about a spinal tap but he refuses Will keep n.p.o. and hold Lovenox in case he changes his mind in the next few hours

## 2022-11-23 NOTE — Assessment & Plan Note (Addendum)
Sodium 129 IV hydration and monitor will get urine sodium and osmolality and serum osmolality

## 2022-11-23 NOTE — ED Notes (Addendum)
RN attempted to get oral temperature, but pt stated to get it out of his mouth, referring to the temperature probe

## 2022-11-23 NOTE — Progress Notes (Signed)
PHARMACY - PHYSICIAN COMMUNICATION CRITICAL VALUE ALERT - BLOOD CULTURE IDENTIFICATION (BCID)  John Booker is an 72 y.o. male who presented to [facility] on 11/22/2022 with a chief complaint of sepsis.  Assessment:  1/4 bottles growing staph epidermidis with MecA detected on BCID   Name of physician (or Provider) Contacted: Dr. Fran Lowes  Current antibiotics: vancomycin, cefepime, metronidazole  Changes to prescribed antibiotics recommended: likely a contaminant, no recommended changes to current antibiotics   Results for orders placed or performed during the hospital encounter of 11/22/22  Blood Culture ID Panel (Reflexed) (Collected: 11/22/2022  9:57 PM)  Result Value Ref Range   Enterococcus faecalis NOT DETECTED NOT DETECTED   Enterococcus Faecium NOT DETECTED NOT DETECTED   Listeria monocytogenes NOT DETECTED NOT DETECTED   Staphylococcus species DETECTED (A) NOT DETECTED   Staphylococcus aureus (BCID) NOT DETECTED NOT DETECTED   Staphylococcus epidermidis DETECTED (A) NOT DETECTED   Staphylococcus lugdunensis NOT DETECTED NOT DETECTED   Streptococcus species NOT DETECTED NOT DETECTED   Streptococcus agalactiae NOT DETECTED NOT DETECTED   Streptococcus pneumoniae NOT DETECTED NOT DETECTED   Streptococcus pyogenes NOT DETECTED NOT DETECTED   A.calcoaceticus-baumannii NOT DETECTED NOT DETECTED   Bacteroides fragilis NOT DETECTED NOT DETECTED   Enterobacterales NOT DETECTED NOT DETECTED   Enterobacter cloacae complex NOT DETECTED NOT DETECTED   Escherichia coli NOT DETECTED NOT DETECTED   Klebsiella aerogenes NOT DETECTED NOT DETECTED   Klebsiella oxytoca NOT DETECTED NOT DETECTED   Klebsiella pneumoniae NOT DETECTED NOT DETECTED   Proteus species NOT DETECTED NOT DETECTED   Salmonella species NOT DETECTED NOT DETECTED   Serratia marcescens NOT DETECTED NOT DETECTED   Haemophilus influenzae NOT DETECTED NOT DETECTED   Neisseria meningitidis NOT DETECTED NOT DETECTED    Pseudomonas aeruginosa NOT DETECTED NOT DETECTED   Stenotrophomonas maltophilia NOT DETECTED NOT DETECTED   Candida albicans NOT DETECTED NOT DETECTED   Candida auris NOT DETECTED NOT DETECTED   Candida glabrata NOT DETECTED NOT DETECTED   Candida krusei NOT DETECTED NOT DETECTED   Candida parapsilosis NOT DETECTED NOT DETECTED   Candida tropicalis NOT DETECTED NOT DETECTED   Cryptococcus neoformans/gattii NOT DETECTED NOT DETECTED   Methicillin resistance mecA/C DETECTED (A) NOT DETECTED   Thank you for involving pharmacy in this patient's care.   Rockwell Alexandria, PharmD Clinical Pharmacist 11/23/2022 9:27 PM

## 2022-11-24 ENCOUNTER — Inpatient Hospital Stay (HOSPITAL_COMMUNITY)
Admit: 2022-11-24 | Discharge: 2022-11-24 | Disposition: A | Discharge status: Home / Self Care | Payer: Medicare Other | Attending: Internal Medicine | Admitting: Internal Medicine

## 2022-11-24 DIAGNOSIS — R7881 Bacteremia: Secondary | ICD-10-CM

## 2022-11-24 LAB — BASIC METABOLIC PANEL
Anion gap: 10 (ref 5–15)
BUN: 15 mg/dL (ref 8–23)
CO2: 23 mmol/L (ref 22–32)
Calcium: 8.2 mg/dL — ABNORMAL LOW (ref 8.9–10.3)
Chloride: 99 mmol/L (ref 98–111)
Creatinine, Ser: 0.85 mg/dL (ref 0.61–1.24)
GFR, Estimated: >60 mL/min (ref >60)
Glucose, Bld: 83 mg/dL (ref 70–99)
Potassium: 3.5 mmol/L (ref 3.5–5.1)
Sodium: 132 mmol/L — ABNORMAL LOW (ref 135–145)

## 2022-11-24 LAB — CBC
HCT: 42.4 % (ref 39.0–52.0)
Hemoglobin: 14.7 g/dL (ref 13.0–17.0)
MCH: 32 pg (ref 26.0–34.0)
MCHC: 34.7 g/dL (ref 30.0–36.0)
MCV: 92.4 fL (ref 80.0–100.0)
Platelets: 134 10*3/uL — ABNORMAL LOW (ref 150–400)
RBC: 4.59 MIL/uL (ref 4.22–5.81)
RDW: 12.9 % (ref 11.5–15.5)
WBC: 5.3 10*3/uL (ref 4.0–10.5)
nRBC: 0 % (ref 0.0–0.2)

## 2022-11-24 LAB — MAGNESIUM: Magnesium: 2 mg/dL (ref 1.7–2.4)

## 2022-11-24 LAB — ECHOCARDIOGRAM COMPLETE
AR max vel: 2.28 cm2
AV Area VTI: 1.82 cm2
AV Area mean vel: 2.21 cm2
AV Mean grad: 4 mmHg
AV Peak grad: 7.4 mmHg
Ao pk vel: 1.36 m/s
Area-P 1/2: 5.38 cm2
Height: 75 in
MV VTI: 1.76 cm2
S' Lateral: 2.4 cm

## 2022-11-24 MED ORDER — ADULT MULTIVITAMIN W/MINERALS CH
1.0000 | ORAL_TABLET | Freq: Every day | ORAL | Status: DC
  Administered 2022-11-24 – 2022-11-26 (×3): 1 via ORAL
  Filled 2022-11-24 (×3): qty 1

## 2022-11-24 MED ORDER — SODIUM CHLORIDE 0.9 % IV SOLN
2.0000 g | INTRAVENOUS | Status: DC
  Administered 2022-11-24: 2 g via INTRAVENOUS
  Filled 2022-11-24: qty 20

## 2022-11-24 MED ORDER — VANCOMYCIN HCL 1250 MG/250ML IV SOLN
1250.0000 mg | Freq: Two times a day (BID) | INTRAVENOUS | Status: DC
  Administered 2022-11-24 – 2022-11-25 (×2): 1250 mg via INTRAVENOUS
  Filled 2022-11-24 (×2): qty 250

## 2022-11-24 MED ORDER — ACETAMINOPHEN 650 MG RE SUPP: 650.0000 mg | Freq: Four times a day (QID) | RECTAL | Status: DC | PRN

## 2022-11-24 MED ORDER — SODIUM CHLORIDE 0.9 % IV SOLN
2.0000 g | Freq: Three times a day (TID) | INTRAVENOUS | Status: DC
  Administered 2022-11-24 (×2): 2 g via INTRAVENOUS
  Filled 2022-11-24 (×2): qty 12.5

## 2022-11-24 MED ORDER — ACETAMINOPHEN 325 MG PO TABS
650.0000 mg | ORAL_TABLET | Freq: Four times a day (QID) | ORAL | Status: DC | PRN
  Administered 2022-11-26: 650 mg via ORAL
  Filled 2022-11-24 (×2): qty 2

## 2022-11-24 MED ORDER — SODIUM CHLORIDE 0.9 % IV SOLN
2.0000 g | INTRAVENOUS | Status: DC
  Administered 2022-11-25 – 2022-11-26 (×2): 2 g via INTRAVENOUS
  Filled 2022-11-24 (×2): qty 20

## 2022-11-24 NOTE — Progress Notes (Signed)
PHARMACY - PHYSICIAN COMMUNICATION CRITICAL VALUE ALERT - BLOOD CULTURE IDENTIFICATION (BCID)  John Booker is an 72 y.o. male who presented to [facility] on 11/22/2022 with a chief complaint of sepsis  Assessment:  Kleb oxytoca in 2 of 4 bottles, no resistance detected.   Staph epi Mec A detected in previous BCID. (include suspected source if known)  Name of physician (or Provider) Contacted: Para March  Current antibiotics: Vanc, cefepime , metronidazole  Changes to prescribed antibiotics recommended:  Will d/c cefepime and metronidazole and start Ceftriaxone 2 gm IV Q24H. Will defer to AM staff regarding continuation of Vanc  Results for orders placed or performed during the hospital encounter of 11/22/22  Blood Culture ID Panel (Reflexed) (Collected: 11/22/2022  9:57 PM)  Result Value Ref Range   Enterococcus faecalis NOT DETECTED NOT DETECTED   Enterococcus Faecium NOT DETECTED NOT DETECTED   Listeria monocytogenes NOT DETECTED NOT DETECTED   Staphylococcus species NOT DETECTED NOT DETECTED   Staphylococcus aureus (BCID) NOT DETECTED NOT DETECTED   Staphylococcus epidermidis NOT DETECTED NOT DETECTED   Staphylococcus lugdunensis NOT DETECTED NOT DETECTED   Streptococcus species NOT DETECTED NOT DETECTED   Streptococcus agalactiae NOT DETECTED NOT DETECTED   Streptococcus pneumoniae NOT DETECTED NOT DETECTED   Streptococcus pyogenes NOT DETECTED NOT DETECTED   A.calcoaceticus-baumannii NOT DETECTED NOT DETECTED   Bacteroides fragilis NOT DETECTED NOT DETECTED   Enterobacterales DETECTED (A) NOT DETECTED   Enterobacter cloacae complex NOT DETECTED NOT DETECTED   Escherichia coli NOT DETECTED NOT DETECTED   Klebsiella aerogenes NOT DETECTED NOT DETECTED   Klebsiella oxytoca DETECTED (A) NOT DETECTED   Klebsiella pneumoniae NOT DETECTED NOT DETECTED   Proteus species NOT DETECTED NOT DETECTED   Salmonella species NOT DETECTED NOT DETECTED   Serratia marcescens NOT DETECTED NOT  DETECTED   Haemophilus influenzae NOT DETECTED NOT DETECTED   Neisseria meningitidis NOT DETECTED NOT DETECTED   Pseudomonas aeruginosa NOT DETECTED NOT DETECTED   Stenotrophomonas maltophilia NOT DETECTED NOT DETECTED   Candida albicans NOT DETECTED NOT DETECTED   Candida auris NOT DETECTED NOT DETECTED   Candida glabrata NOT DETECTED NOT DETECTED   Candida krusei NOT DETECTED NOT DETECTED   Candida parapsilosis NOT DETECTED NOT DETECTED   Candida tropicalis NOT DETECTED NOT DETECTED   Cryptococcus neoformans/gattii NOT DETECTED NOT DETECTED   CTX-M ESBL NOT DETECTED NOT DETECTED   Carbapenem resistance IMP NOT DETECTED NOT DETECTED   Carbapenem resistance KPC NOT DETECTED NOT DETECTED   Carbapenem resistance NDM NOT DETECTED NOT DETECTED   Carbapenem resist OXA 48 LIKE NOT DETECTED NOT DETECTED   Carbapenem resistance VIM NOT DETECTED NOT DETECTED    Rendell Thivierge D 11/24/2022  1:13 AM

## 2022-11-24 NOTE — Progress Notes (Signed)
Pharmacy Antibiotic Note  John Booker is a 72 y.o. male admitted on 11/22/2022 with sepsis.  Pharmacy has been consulted for Vanc, Cefepime dosing.  Plan: Adjust vancomycin 1250 mg every 12 hours Goal AUC 400-550 Estimated AUC 452.1, Scr 0.85, IBW, Vd 0.72  Switch back to cefepime 2 grams every 8 hours  Height: 6\' 3"  (190.5 cm) Weight:  (pt refused to lay flat in order to weigh him in the bed) IBW/kg (Calculated) : 84.5  Temp (24hrs), Avg:97.9 F (36.6 C), Min:97.7 F (36.5 C), Max:98.4 F (36.9 C)  Recent Labs  Lab 11/22/22 2157 11/23/22 0320 11/23/22 0922 11/24/22 0332  WBC 8.3  --  7.4 5.3  CREATININE 1.15  --  0.96 0.85  LATICACIDVEN 2.4* 1.7  --   --     Estimated Creatinine Clearance: 92.5 mL/min (by C-G formula based on SCr of 0.85 mg/dL).    Allergies  Allergen Reactions   Adalat [Nifedipine]     Foot swelling.    Amlodipine Other (See Comments)    Leg swelling    Antimicrobials this admission:   >>    >>   Dose adjustments this admission:   Microbiology results:  BCx:   UCx:    Sputum:    MRSA PCR:   Thank you for allowing pharmacy to be a part of this patient's care.  Jaynie Bream 11/24/2022 9:01 AM

## 2022-11-24 NOTE — Progress Notes (Signed)
*  PRELIMINARY RESULTS* Echocardiogram 2D Echocardiogram has been performed.  Carolyne Fiscal 11/24/2022, 12:24 PM

## 2022-11-24 NOTE — Evaluation (Signed)
Physical Therapy Evaluation Patient Details Name: John Booker MRN: [id number] DOB: [birth year] Today's Date: 11/24/2022  History of Present Illness  Patient is a 72 y.o. male with medical history significant for Diastolic CHF (EF 60 to 65%, G1 DD, 11/2021), HTN, intractable hiccups followed by GI, mediastinal mass followed by oncology last seen 10/2021,  MSSA bacteremia 11/2021 from foot wounds (MRI at that time showed fluid collection L5-S1-septic arthritis difficult to exclude), who presents to the ED with fever, weakness and altered mental status that started the day of arrival. Current MD assessment includes: Sepsis, acute metabolic, hypokalemia, and Acute metabolic encephalopathy.  Clinical Impression  Pt was willing to participate during the session with education and encouragement, and put forth fair effort throughout. Pt is currently Mod I with bed mobility, needing increased time and use of bed rails. Upon siting up pt reporting feeling dizzy, vitals taken in seated position: BP 89/65 mmHg, HR: 99 bpm. Pt able to stand with RW and CGA for safety, Once standing pt reports dizziness has not changed, vitals taken standing: BP 62/44 mmHg, HR: 161 bpm. Pt took a few steps to [facility] with RW and CGA, laid back down promptly. Final vitals taken in supine: BP 108/82 mmHg, HR: 149 bpm. Nursing and MD notified of vitals above, deferred further mobilization at this time. Pt will benefit from continued PT services upon discharge to safely address deficits listed in patient problem list for decreased caregiver assistance and eventual return to PLOF.           If plan is discharge home, recommend the following: A little help with walking and/or transfers;A little help with bathing/dressing/bathroom;Assist for transportation;Help with stairs or ramp for entrance;Assistance with cooking/housework   Can travel by private vehicle        Equipment Recommendations Other (comment) (TBD at next venue of care)   Recommendations for Other Services       Functional Status Assessment Patient has had a recent decline in their functional status and demonstrates the ability to make significant improvements in function in a reasonable and predictable amount of time.     Precautions / Restrictions Precautions Precautions: Fall Restrictions Weight Bearing Restrictions: No      Mobility  Bed Mobility Overal bed mobility: Modified Independent Bed Mobility: Supine to Sit, Sit to Supine     Supine to sit: Modified independent (Device/Increase time), Used rails, HOB elevated Sit to supine: Modified independent (Device/Increase time), HOB elevated, Used rails        Transfers Overall transfer level: Needs assistance Equipment used: Rolling walker (2 wheels) Transfers: Sit to/from Stand Sit to Stand: Contact guard assist           General transfer comment: Min A provided for initial boost with self selected strategy    Ambulation/Gait Ambulation/Gait assistance: Contact guard assist Gait Distance (Feet): 3 Feet Assistive device: Rolling walker (2 wheels) Gait Pattern/deviations: Shuffle       General Gait Details: few shuffling side steps towards HOB, VC's provided for sequencing  Stairs            Wheelchair Mobility     Tilt Bed    Modified Rankin (Stroke Patients Only)       Balance Overall balance assessment: No apparent balance deficits (not formally assessed)  Pertinent Vitals/Pain Pain Assessment Pain Assessment: Faces Faces Pain Scale: Hurts a little bit Pain Location: general; during movement Pain Descriptors / Indicators: Grimacing, Moaning Pain Intervention(s): Monitored during session    Home Living Family/patient expects to be discharged to:: Private residence Living Arrangements: Alone Available Help at Discharge: Family;Available PRN/intermittently Type of Home: House Home Access:  Stairs to enter Entrance Stairs-Rails: Doctor, general practice of Steps: 5   Home Layout: One level        Prior Function Prior Level of Function : Independent/Modified Independent;History of Falls (last six months)             Mobility Comments: Pt uses a walker as needed, uses it most of the time around the house. household ambulator. 1 fall recently; stumbled ADLs Comments: Ind     Extremity/Trunk Assessment   Upper Extremity Assessment Upper Extremity Assessment: Generalized weakness    Lower Extremity Assessment Lower Extremity Assessment: Generalized weakness       Communication   Communication Communication: No apparent difficulties  Cognition Arousal: Alert Behavior During Therapy: WFL for tasks assessed/performed Overall Cognitive Status: Within Functional Limits for tasks assessed                                          General Comments      Exercises     Assessment/Plan    PT Assessment Patient needs continued PT services  PT Problem List Decreased strength;Decreased range of motion;Decreased activity tolerance;Decreased balance;Decreased mobility;Decreased coordination       PT Treatment Interventions DME instruction;Gait training;Stair training;Functional mobility training;Therapeutic activities;Therapeutic exercise;Balance training    PT Goals (Current goals can be found in the Care Plan section)  Acute Rehab PT Goals Patient Stated Goal: get stronger PT Goal Formulation: With patient Time For Goal Achievement: 12/07/22 Potential to Achieve Goals: Fair    Frequency Min 1X/week     Co-evaluation               AM-PAC PT "6 Clicks" Mobility  Outcome Measure Help needed turning from your back to your side while in a flat bed without using bedrails?: A Little Help needed moving from lying on your back to sitting on the side of a flat bed without using bedrails?: A Little Help needed moving to and from a  bed to a chair (including a wheelchair)?: A Little Help needed standing up from a chair using your arms (e.g., wheelchair or bedside chair)?: A Little Help needed to walk in hospital room?: A Little Help needed climbing 3-5 steps with a railing? : A Little 6 Click Score: 18    End of Session Equipment Utilized During Treatment: Gait belt Activity Tolerance: Treatment limited secondary to medical complications (Comment) (BP; see PT comments) Patient left: with bed alarm set;in bed;with call bell/phone within reach Nurse Communication: Mobility status (BP) PT Visit Diagnosis: Unsteadiness on feet (R26.81);Difficulty in walking, not elsewhere classified (R26.2);Other abnormalities of gait and mobility (R26.89)    Time: 1030-1105 PT Time Calculation (min) (ACUTE ONLY): 35 min   Charges:                  Cecile Sheerer, SPT 11/24/22, 1:28 PM

## 2022-11-24 NOTE — Progress Notes (Signed)
John Booker  [id number]:[id number] DOB: 1950/04/25 DOA: 11/22/2022 PCP: Marisue Ivan, MD  114A/114A-AA  LOS: 1 day   Brief hospital course:   Assessment & Plan: John Booker is a 72 y.o. male with medical history significant for Diastolic CHF (EF 60 to 65%, G1 DD, 11/2021), HTN, intractable hiccups followed by GI, mediastinal mass followed by oncology last seen 10/2021,  MSSA bacteremia 11/2021 from foot wounds (MRI at that time showed fluid collection L5-S1-septic arthritis difficult to exclude), who presents to the ED with fever, weakness and altered mental status that started the day of arrival.  Patient tells me that he was brought to the hospital because he fell.    * Sepsis (HCC) 2/2 Kleb oxytoca bacteremia Patient meeting sepsis criteria with fever, tachycardia and tachypnea and lactic acidosis, normal WBC --blood cx came back 2/4 pos for Kleb oxytoca --d/c cefepime and Flagyl --start ceftriaxone  1/4 pos MRSE --cont vanc for now --ID consult tomorrow with Dr. Rivka Safer  History of MSSA bacteremia September 2023 secondary to foot wounds Headache Endorses chronic neck pain but new headache following a fall CT chest abdomen and pelvis nonrevealing Had MRI LS spine September 2023 with fluid collection but no back pain on examination so low suspicion for this as a source --pt refused spinal tap on admission  Acute metabolic encephalopathy Secondary to sepsis.  Meningitis not ruled out  Hyponatremia Sodium 129 on presentation.  Improved with IVF  Hypokalemia --monitor and supplement PRN  Mediastinal mass Previously followed by oncology, not seen since September 2023  Essential hypertension Continue losartan  Chronic diastolic CHF (congestive heart failure) (HCC) Continue losartan.   --hold lasix for now   DVT prophylaxis: Lovenox SQ Code Status: DNR  Family Communication:  Level of care: Med-Surg Dispo:   The patient is from:  home Anticipated d/c is to: SNF rehab Anticipated d/c date is: > 3 days   Subjective and Interval History:  Pt with no new complaint.  Was not happy to hear he had to stay in the hospital longer.   Objective: Vitals:   11/24/22 0507 11/24/22 0744 11/24/22 1220 11/24/22 1553  BP: (!) 181/92 (!) 173/90 (!) 144/76 (!) 146/77  Pulse: 92 88 98 88  Resp: 18 16 17 17   Temp: 98 F (36.7 C) 98.4 F (36.9 C) 98.2 F (36.8 C) 98 F (36.7 C)  TempSrc:   Oral Oral  SpO2: 100% 100% 100% 100%  Height:        Intake/Output Summary (Last 24 hours) at 11/24/2022 1936 Last data filed at 11/24/2022 1458 Gross per 24 hour  Intake 240 ml  Output 725 ml  Net -485 ml   Filed Weights    Examination:   Constitutional: NAD, AAOx3 HEENT: conjunctivae and lids normal, EOMI CV: No cyanosis.   RESP: normal respiratory effort, on RA Extremities: No effusions, edema in BLE SKIN: warm, dry Neuro: II - XII grossly intact.     Data Reviewed: I have personally reviewed labs and imaging studies  Time spent: 50 minutes  Darlin Priestly, MD [facility] If 7PM-7AM, please contact night-coverage 11/24/2022, 7:36 PM

## 2022-11-24 NOTE — Progress Notes (Signed)
Initial Nutrition Assessment  DOCUMENTATION CODES:   Not applicable  INTERVENTION:   -Liberalize diet to 2 gram sodium for wider variety of meal selections -MVI with minerals daily -Continue Ensure Enlive po BID, each supplement provides 350 kcal and 20 grams of protein.   NUTRITION DIAGNOSIS:   Increased nutrient needs related to acute illness (sepsis) as evidenced by estimated needs.  GOAL:   Patient will meet greater than or equal to 90% of their needs  MONITOR:   PO intake, Supplement acceptance  REASON FOR ASSESSMENT:   Malnutrition Screening Tool    ASSESSMENT:   Pt with medical history significant for Diastolic CHF (EF 60 to 65%, G1 DD, 11/2021), HTN, intractable hiccups followed by GI, mediastinal mass followed by oncology last seen 10/2021,  MSSA bacteremia 11/2021 from foot wounds (MRI at that time showed fluid collection L5-S1-septic arthritis difficult to exclude), who presents with fever, weakness and altered mental status  Pt admitted with sepsis, MSSA bacteremia, and acute metabolic encephalopathy.   Reviewed I/O's: -1.1 L x 24 hours  UOP: 1.1 L x 24 hours  Pt lying in bed at time of visit, sleeping soundly. He did not respond to voice or touch.   Pt currently on a heart healthy diet. Meal completions 50%. Ensure supplements have been ordered and pt has been drinking.   Reviewed wt hx; pt has experienced a 5.6% wt loss over the past year, which is not significant for time frame.   Pt with increased nutritional needs for acute illness and would benefit from addition of oral nutrition supplements.   Medications reviewed.   Lab Results  Component Value Date   HGBA1C 5.3 06/26/2021   PTA DM medications are none.   Labs reviewed: Na: 132, CBGS: 104 (inpatient orders for glycemic control are none).    NUTRITION - FOCUSED PHYSICAL EXAM:  Flowsheet Row Most Recent Value  Orbital Region No depletion  Upper Arm Region No depletion  Thoracic and Lumbar  Region No depletion  Buccal Region No depletion  Temple Region No depletion  Clavicle Bone Region No depletion  Clavicle and Acromion Bone Region No depletion  Scapular Bone Region No depletion  Dorsal Hand No depletion  Patellar Region No depletion  Anterior Thigh Region No depletion  Posterior Calf Region No depletion  Edema (RD Assessment) Mild  Hair Reviewed  Eyes Reviewed  Mouth Reviewed  Skin Reviewed  Nails Reviewed       Diet Order:   Diet Order             Diet Heart Room service appropriate? Yes; Fluid consistency: Thin  Diet effective now                   EDUCATION NEEDS:   No education needs have been identified at this time  Skin:  Skin Assessment: Reviewed RN Assessment  Last BM:  11/24/22 (type 6)  Height:   Ht Readings from Last 1 Encounters:  11/23/22 6\' 3"  (1.905 m)    Weight:   Wt Readings from Last 1 Encounters:  11/13/22 94.6 kg    Ideal Body Weight:  89.1 kg  BMI:  Body mass index is 26.07 kg/m.  Estimated Nutritional Needs:   Kcal:  1610-9604  Protein:  115-130 grams  Fluid:  > 2 L    Levada Schilling, RD, LDN, CDCES Registered Dietitian II Certified Diabetes Care and Education Specialist Please refer to [facility] for RD and/or RD on-call/weekend/after hours pager

## 2022-11-24 NOTE — Progress Notes (Addendum)
CROSS COVER NOTE  NAME: John Booker MRN: [id number] DOB : [birth year]    Concern as stated by nurse / staff   hi, lab called with BCID on this pt, he grew out Kleb oxytoca in 2 of 4 bottles, no resistance detected. He's currently on vanc, cefepime and flagyl , our protocol recommends ceftriaxone 2 gm IV Q24H .Marland Kitchen. can I d/c current abx and start ceftriaxone ?    ok to start ceftriaxone ?      Pertinent findings on chart review: H&P reviewed. Patient admitted on 9/15 for sepsis of unknown source in the setting of history of prior MSSA bacteremia in September 2023 secondary to foot wounds.  Had a headache and neck pain but refused LP    Assessment and  Interventions   Assessment:  Klebsiella bacteremia (2 of 4 bottles on BC ID) Staph species Past history of MSSA bacteremia without vegetations  Plan: Ceftriaxone to replace cefepime as recommended by pharmacy Continuing Vanco for now as staph species also on BC ID DC Flagyl Echo ordered Consider ID consult

## 2022-11-25 DIAGNOSIS — B957 Other staphylococcus as the cause of diseases classified elsewhere: Secondary | ICD-10-CM | POA: Diagnosis not present

## 2022-11-25 DIAGNOSIS — R7881 Bacteremia: Secondary | ICD-10-CM | POA: Diagnosis not present

## 2022-11-25 LAB — CBC
HCT: 42.5 % (ref 39.0–52.0)
Hemoglobin: 14.5 g/dL (ref 13.0–17.0)
MCH: 31.7 pg (ref 26.0–34.0)
MCHC: 34.1 g/dL (ref 30.0–36.0)
MCV: 92.8 fL (ref 80.0–100.0)
Platelets: 148 10*3/uL — ABNORMAL LOW (ref 150–400)
RBC: 4.58 MIL/uL (ref 4.22–5.81)
RDW: 12.9 % (ref 11.5–15.5)
WBC: 4.4 10*3/uL (ref 4.0–10.5)
nRBC: 0 % (ref 0.0–0.2)

## 2022-11-25 LAB — BASIC METABOLIC PANEL
Anion gap: 7 (ref 5–15)
BUN: 17 mg/dL (ref 8–23)
CO2: 24 mmol/L (ref 22–32)
Calcium: 8.1 mg/dL — ABNORMAL LOW (ref 8.9–10.3)
Chloride: 101 mmol/L (ref 98–111)
Creatinine, Ser: 0.94 mg/dL (ref 0.61–1.24)
GFR, Estimated: >60 mL/min (ref >60)
Glucose, Bld: 90 mg/dL (ref 70–99)
Potassium: 3.5 mmol/L (ref 3.5–5.1)
Sodium: 132 mmol/L — ABNORMAL LOW (ref 135–145)

## 2022-11-25 LAB — CULTURE, BLOOD (ROUTINE X 2): Special Requests: ADEQUATE

## 2022-11-25 LAB — MAGNESIUM: Magnesium: 1.9 mg/dL (ref 1.7–2.4)

## 2022-11-25 NOTE — Progress Notes (Signed)
PROGRESS NOTE    John Booker  WJX:[id number] DOB: 1950-11-19 DOA: 11/22/2022 PCP: Marisue Ivan, MD  114A/114A-AA  LOS: 2 days   Brief hospital course:   Assessment & Plan: John Booker is a 72 y.o. male with medical history significant for Diastolic CHF (EF 60 to 65%, G1 DD, 11/2021), HTN, intractable hiccups followed by GI, mediastinal mass followed by oncology last seen 10/2021,  MSSA bacteremia 11/2021 from foot wounds (MRI at that time showed fluid collection L5-S1-septic arthritis difficult to exclude), who presents to the ED with fever, weakness and altered mental status that started the day of arrival.  Patient tells me that he was brought to the hospital because he fell.    * Sepsis (HCC) 2/2 Kleb oxytoca bacteremia Patient meeting sepsis criteria with fever, tachycardia and tachypnea and lactic acidosis, normal WBC --blood cx came back 2/4 pos for Kleb oxytoca --d/c'ed cefepime and Flagyl --cont ceftriaxone --ID consult today  1/4 pos MRSE --cont vanc for now --ID consult today  History of MSSA bacteremia September 2023 secondary to foot wounds Headache Endorses chronic neck pain but new headache following a fall CT chest abdomen and pelvis nonrevealing Had MRI LS spine September 2023 with fluid collection but no back pain on examination so low suspicion for this as a source --pt refused spinal tap on admission  Acute metabolic encephalopathy Secondary to sepsis.  Meningitis not ruled out  Hyponatremia Sodium 129 on presentation.  Improved with IVF  Hypokalemia --monitor and supplement PRN  Mediastinal mass Previously followed by oncology, not seen since September 2023  Essential hypertension Continue losartan  Chronic diastolic CHF (congestive heart failure) (HCC) Continue losartan.   --not on lasix PTA --IV hydra PRN   DVT prophylaxis: Lovenox SQ Code Status: DNR  Family Communication:  Level of care: Med-Surg Dispo:   The patient is  from: home Anticipated d/c is to: SNF rehab Anticipated d/c date is: 2-3 days   Subjective and Interval History:  Pt reported just feeling weak.  Was unhappy that he can't go home yet.   Objective: Vitals:   11/25/22 0800 11/25/22 0815 11/25/22 1800 11/25/22 1953  BP: (!) 167/100 (!) 165/98 (!) 154/80 (!) 191/96  Pulse: 87   72  Resp: 18   18  Temp: 97.8 F (36.6 C)  98.9 F (37.2 C) 98.1 F (36.7 C)  TempSrc:   Oral   SpO2: 98%   98%  Height:        Intake/Output Summary (Last 24 hours) at 11/25/2022 2027 Last data filed at 11/25/2022 1801 Gross per 24 hour  Intake 416.38 ml  Output --  Net 416.38 ml   Filed Weights    Examination:   Constitutional: NAD, AAOx3, sitting on bedside commode HEENT: conjunctivae and lids normal, EOMI CV: No cyanosis.   RESP: normal respiratory effort, on RA Neuro: II - XII grossly intact.   Psych: grouchy mood and affect.     Data Reviewed: I have personally reviewed labs and imaging studies  Time spent: 35 minutes  Darlin Priestly, MD [facility] If 7PM-7AM, please contact night-coverage 11/25/2022, 8:27 PM

## 2022-11-25 NOTE — Consult Note (Signed)
NAME: John Booker  DOB: 1950/09/13  MRN: [id number]  Date/Time: 11/25/2022 2:48 PM  REQUESTING PROVIDER: Kateri Plummer Subjective:  REASON FOR CONSULT: MRSE bacteremia ? John Booker is a 72 y.o. with a history of BPH, HTN MSSA bacteremia, .b/l facet joint septic arthritis L5-S1 in sept 2023 and treated with appropriate antibiotics, cataract extraction left eye on 11/13/22 presented to ED with fever and weakness on 11/23/22 Vitals in the ED  11/22/22  BP 155/82 !  Temp 103.9 F (39.9 C) !  Pulse Rate 104 !  Resp 26 !  SpO2 97 %    Latest Reference Range & Units 11/22/22  WBC 4.0 - 10.5 K/uL 8.3  Hemoglobin 13.0 - 17.0 g/dL 64.4  HCT 03.4 - 74.2 % 46.0  Platelets 150 - 400 K/uL 180  Creatinine 0.61 - 1.24 mg/dL 5.95  Blood culture sent, and started on vanco/cefepime and flagyl COVID, flu resp panel negative   CT chest /abd/pelvis showed punctate renal calculi, non obstructive GB stone, prostamegaly CXR no infiltrate UA no wbc I am seeing patient as one culture had klebsiella and other bottle had staph epidermidis- pt is currently on ceftriaxone. Fever resolved since admission Past Medical History:  Diagnosis Date   Chronic hiccups    intermittent   Hypertension    Memory changes     Past Surgical History:  Procedure Laterality Date   CATARACT EXTRACTION W/PHACO Left 11/13/2022   Procedure: CATARACT EXTRACTION PHACO AND INTRAOCULAR LENS PLACEMENT (IOC) LEFT OMIDRIA   13.58  01:23.3;  Surgeon: Estanislado Pandy, MD;  Location: [facility];  Service: Ophthalmology;  Laterality: Left;   ESOPHAGOGASTRODUODENOSCOPY (EGD) WITH PROPOFOL N/A 05/26/2018   Procedure: ESOPHAGOGASTRODUODENOSCOPY (EGD) WITH PROPOFOL;  Surgeon: Pasty Spillers, MD;  Location: [facility];  Service: Endoscopy;  Laterality: N/A;   ESOPHAGOGASTRODUODENOSCOPY (EGD) WITH PROPOFOL N/A 12/28/2020   Procedure: ESOPHAGOGASTRODUODENOSCOPY (EGD) WITH PROPOFOL;  Surgeon: Jaynie Collins, DO;   Location: [facility] END[facility]COPY;  Service: Gastroenterology;  Laterality: N/A;   ESOPHAGOGASTRODUODENOSCOPY (EGD) WITH PROPOFOL N/A 10/09/2021   Procedure: ESOPHAGOGASTRODUODENOSCOPY (EGD) WITH PROPOFOL;  Surgeon: Wyline Mood, MD;  Location: [facility] ENDOSCOPY;  Service: Gastroenterology;  Laterality: N/A;   INCISION AND DRAINAGE PERIRECTAL ABSCESS N/A 11/19/2015   Procedure: IRRIGATION AND DEBRIDEMENT PERIRECTAL ABSCESS and debridement of scrotal abscess;  Surgeon: Lattie Haw, MD;  Location: [facility];  Service: General;  Laterality: N/A;   INCISION AND DRAINAGE PERIRECTAL ABSCESS N/A 11/20/2015   Procedure: IRRIGATION AND DEBRIDEMENT PERIRECTAL ABSCESS / WITH DRESSING CHANGE;  Surgeon: Lattie Haw, MD;  Location: [facility];  Service: General;  Laterality: N/A;  peri rectal site   INCISION AND DRAINAGE PERIRECTAL ABSCESS N/A 11/21/2015   Procedure: IRRIGATION AND DEBRIDEMENT PERIRECTAL ABSCESS WITH DRESSING CHANGE;  Surgeon: Lattie Haw, MD;  Location: [facility];  Service: General;  Laterality: N/A;   MINOR HEMORRHOIDECTOMY  1989   TEE WITHOUT CARDIOVERSION N/A 11/20/2021   Procedure: TRANSESOPHAGEAL ECHOCARDIOGRAM (TEE);  Surgeon: Lamar Blinks, MD;  Location: [facility];  Service: Cardiovascular;  Laterality: N/A;  8:00am    Social History   Socioeconomic History   Marital status: Legally Separated    Spouse name: Not on file   Number of children: Not on file   Years of education: Not on file   Highest education level: Not on file  Occupational History   Not on file  Tobacco Use   Smoking status: Former    Current packs/day: 0.00    Average packs/day: 0.5 packs/day  for 38.0 years (19.0 ttl pk-yrs)    Types: Cigarettes    Start date: 04/10/1984    Quit date: 04/10/2022    Years since quitting: 0.6   Smokeless tobacco: Former  Building services engineer status: Never Used  Substance and Sexual Activity   Alcohol use: Not Currently    Comment: several months,[place]   Drug use: No   Sexual  activity: Not Currently  Other Topics Concern   Not on file  Social History Narrative   Not on file   Social Determinants of Health   Financial Resource Strain: Not on file  Food Insecurity: No Food Insecurity (11/23/2022)   Hunger Vital Sign    Worried About Running Out of Food in the Last Year: Never true    Ran Out of Food in the Last Year: Never true  Transportation Needs: No Transportation Needs (11/23/2022)   PRAPARE - Administrator, Civil Service (Medical): No    Lack of Transportation (Non-Medical): No  Physical Activity: Not on file  Stress: Not on file  Social Connections: Not on file  Intimate Partner Violence: Not At Risk (11/23/2022)   Humiliation, Afraid, Rape, and Kick questionnaire    Fear of Current or Ex-Partner: No    Emotionally Abused: No    Physically Abused: No    Sexually Abused: No    Family History  Problem Relation Age of Onset   Hypertension Father    Allergies  Allergen Reactions   Adalat [Nifedipine]     Foot swelling.    Amlodipine Other (See Comments)    Leg swelling   I? Current Facility-Administered Medications  Medication Dose Route Frequency Provider Last Rate Last Admin   acetaminophen (TYLENOL) tablet 650 mg  650 mg Oral Q6H PRN Jaynie Bream, RPH       Or   acetaminophen (TYLENOL) suppository 650 mg  650 mg Rectal Q6H PRN Jaynie Bream, RPH       cefTRIAXone (ROCEPHIN) 2 g in sodium chloride 0.9 % 100 mL IVPB  2 g Intravenous Q24H Darlin Priestly, MD 200 mL/hr at 11/25/22 0542 2 g at 11/25/22 0542   enoxaparin (LOVENOX) injection 40 mg  40 mg Subcutaneous Q24H Lindajo Royal V, MD   40 mg at 11/25/22 2841   feeding supplement (ENSURE ENLIVE / ENSURE PLUS) liquid 237 mL  237 mL Oral BID BM Darlin Priestly, MD   237 mL at 11/24/22 1530   gabapentin (NEURONTIN) capsule 100 mg  100 mg Oral TID Darlin Priestly, MD   100 mg at 11/25/22 3244   hydrALAZINE (APRESOLINE) injection 10 mg  10 mg Intravenous Q6H PRN Darlin Priestly, MD        HYDROcodone-acetaminophen (NORCO/VICODIN) 5-325 MG per tablet 1-2 tablet  1-2 tablet Oral Q4H PRN Andris Baumann, MD       losartan (COZAAR) tablet 100 mg  100 mg Oral Daily Darlin Priestly, MD   100 mg at 11/25/22 0102   multivitamin with minerals tablet 1 tablet  1 tablet Oral Daily Darlin Priestly, MD   1 tablet at 11/25/22 0922   ondansetron (ZOFRAN) tablet 4 mg  4 mg Oral Q6H PRN Andris Baumann, MD       Or   ondansetron [facility]) injection 4 mg  4 mg Intravenous Q6H PRN Andris Baumann, MD       sodium chloride (OCEAN) 0.65 % nasal spray 1 spray  1 spray Each Nare PRN Darlin Priestly, MD  vancomycin (VANCOREADY) IVPB 1250 mg/250 mL  1,250 mg Intravenous Q12H Jaynie Bream, RPH 166.7 mL/hr at 11/25/22 0927 1,250 mg at 11/25/22 3086     Abtx:  Anti-infectives (From admission, onward)    Start     Dose/Rate Route Frequency Ordered Stop   11/25/22 0600  cefTRIAXone (ROCEPHIN) 2 g in sodium chloride 0.9 % 100 mL IVPB        2 g 200 mL/hr over 30 Minutes Intravenous Every 24 hours 11/24/22 1640     11/24/22 2200  vancomycin (VANCOREADY) IVPB 1250 mg/250 mL        1,250 mg 166.7 mL/hr over 90 Minutes Intravenous Every 12 hours 11/24/22 0903     11/24/22 0800  ceFEPIme (MAXIPIME) 2 g in sodium chloride 0.9 % 100 mL IVPB  Status:  Discontinued        2 g 200 mL/hr over 30 Minutes Intravenous Every 8 hours 11/24/22 0732 11/24/22 1640   11/24/22 0600  cefTRIAXone (ROCEPHIN) 2 g in sodium chloride 0.9 % 100 mL IVPB  Status:  Discontinued        2 g 200 mL/hr over 30 Minutes Intravenous Every 24 hours 11/24/22 0111 11/24/22 0732   11/24/22 0000  vancomycin (VANCOREADY) IVPB 2000 mg/400 mL  Status:  Discontinued        2,000 mg 200 mL/hr over 120 Minutes Intravenous Every 24 hours 11/23/22 0434 11/24/22 0903   11/23/22 1030  metroNIDAZOLE (FLAGYL) IVPB 500 mg  Status:  Discontinued        500 mg 100 mL/hr over 60 Minutes Intravenous Every 12 hours 11/23/22 0355 11/24/22 0110   11/23/22 0700   ceFEPIme (MAXIPIME) 2 g in sodium chloride 0.9 % 100 mL IVPB  Status:  Discontinued        2 g 200 mL/hr over 30 Minutes Intravenous Every 8 hours 11/23/22 0431 11/24/22 0110   11/22/22 2215  ceFEPIme (MAXIPIME) 2 g in sodium chloride 0.9 % 100 mL IVPB        2 g 200 mL/hr over 30 Minutes Intravenous  Once 11/22/22 2209 11/22/22 2303   11/22/22 2215  metroNIDAZOLE (FLAGYL) IVPB 500 mg        500 mg 100 mL/hr over 60 Minutes Intravenous  Once 11/22/22 2209 11/22/22 2334   11/22/22 2215  vancomycin (VANCOCIN) IVPB 1000 mg/200 mL premix  Status:  Discontinued        1,000 mg 200 mL/hr over 60 Minutes Intravenous  Once 11/22/22 2209 11/22/22 2213   11/22/22 2215  vancomycin (VANCOREADY) IVPB 2000 mg/400 mL        2,000 mg 200 mL/hr over 120 Minutes Intravenous  Once 11/22/22 2213 11/23/22 0228       REVIEW OF SYSTEMS:  Const: fever, negative chills, negative weight loss Eyes: negative diplopia or visual changes, negative eye pain ENT: negative coryza, negative sore throat Resp: negative cough, hemoptysis, dyspnea Cards: negative for chest pain, palpitations, lower extremity edema GU: negative for frequency, dysuria and hematuria GI: Negative for abdominal pain, diarrhea, bleeding, constipation Skin: negative for rash and pruritus Heme: negative for easy bruising and gum/nose bleeding MS: weakness and fall Neurolo:negative for headaches, dizziness, vertigo, memory problems  Psych: negative for feelings of anxiety, depression  Endocrine: negative for thyroid, diabetes Allergy/Immunology- as above Objective:  VITALS:  BP (!) 165/98   Pulse 87   Temp 97.8 F (36.6 C)   Resp 18   Ht 6\' 3"  (1.905 m)   SpO2 98%   BMI  26.07 kg/m  LDA Foley Central line Other drainage tubes PHYSICAL EXAM:  General: Alert, cooperative, no distress, appears stated age.  Head: Normocephalic, without obvious abnormality, atraumatic. Eyes: Conjunctivae clear, anicteric sclerae. Pupils are equal ENT  Nares normal. No drainage or sinus tenderness. Lips, mucosa, and tongue normal. No Thrush Neck: Supple, symmetrical, no adenopathy, thyroid: non tender no carotid bruit and no JVD. Back: No CVA tenderness. Lungs: Clear to auscultation bilaterally. No Wheezing or Rhonchi. No rales. Heart: Regular rate and rhythm, no murmur, rub or gallop. Abdomen: Soft, non-tender,not distended. Bowel sounds normal. No masses Extremities: atraumatic, no cyanosis. No edema. No clubbing Skin: No rashes or lesions. Or bruising Lymph: Cervical, supraclavicular normal. Neurologic: did not examine in detail Pertinent Labs Lab Results CBC    Component Value Date/Time   WBC 4.4 11/25/2022 0518   RBC 4.58 11/25/2022 0518   HGB 14.5 11/25/2022 0518   HGB 15.7 04/15/2012 2049   HCT 42.5 11/25/2022 0518   HCT 47.7 04/15/2012 2049   PLT 148 (L) 11/25/2022 0518   PLT 175 04/15/2012 2049   MCV 92.8 11/25/2022 0518   MCV 92 04/15/2012 2049   MCH 31.7 11/25/2022 0518   MCHC 34.1 11/25/2022 0518   RDW 12.9 11/25/2022 0518   RDW 13.9 04/15/2012 2049   LYMPHSABS 0.2 (L) 11/22/2022 2157   MONOABS 0.2 11/22/2022 2157   EOSABS 0.0 11/22/2022 2157   BASOSABS 0.0 11/22/2022 2157       Latest Ref Rng & Units 11/25/2022    5:18 AM 11/24/2022    3:32 AM 11/23/2022    9:22 AM  CMP  Glucose 70 - 99 mg/dL 90  83  829   BUN 8 - 23 mg/dL 17  15  17    Creatinine 0.61 - 1.24 mg/dL 5.62  1.30  8.65   Sodium 135 - 145 mmol/L 132  132  128   Potassium 3.5 - 5.1 mmol/L 3.5  3.5  4.4   Chloride 98 - 111 mmol/L 101  99  97   CO2 22 - 32 mmol/L 24  23  22    Calcium 8.9 - 10.3 mg/dL 8.1  8.2  8.2       Microbiology: Recent Results (from the past 240 hour(s))  Resp panel by RT-PCR (RSV, Flu A&B, Covid) Anterior Nasal Swab     Status: None   Collection Time: 11/22/22  9:57 PM   Specimen: Anterior Nasal Swab  Result Value Ref Range Status   SARS Coronavirus 2 by RT PCR NEGATIVE NEGATIVE Final    Comment:  (NOTE) SARS-CoV-2 target nucleic acids are NOT DETECTED.  The SARS-CoV-2 RNA is generally detectable in upper respiratory specimens during the acute phase of infection. The lowest concentration of SARS-CoV-2 viral copies this assay can detect is 138 copies/mL. A negative result does not preclude SARS-Cov-2 infection and should not be used as the sole basis for treatment or other patient management decisions. A negative result may occur with  improper specimen collection/handling, submission of specimen other than nasopharyngeal swab, presence of viral mutation(s) within the areas targeted by this assay, and inadequate number of viral copies(<138 copies/mL). A negative result must be combined with clinical observations, patient history, and epidemiological information. The expected result is Negative.  Fact Sheet for Patients:  BloggerCourse.com  Fact Sheet for Healthcare Providers:  SeriousBroker.it  This test is no t yet approved or cleared by the [place] FDA and  has been authorized for detection and/or diagnosis of SARS-CoV-2 by FDA  under an Emergency Use Authorization (EUA). This EUA will remain  in effect (meaning this test can be used) for the duration of the COVID-19 declaration under Section 564(b)(1) of the Act, 21 U.S.C.section 360bbb-3(b)(1), unless the authorization is terminated  or revoked sooner.       Influenza A by PCR NEGATIVE NEGATIVE Final   Influenza B by PCR NEGATIVE NEGATIVE Final    Comment: (NOTE) The Xpert Xpress SARS-CoV-2/FLU/RSV plus assay is intended as an aid in the diagnosis of influenza from Nasopharyngeal swab specimens and should not be used as a sole basis for treatment. Nasal washings and aspirates are unacceptable for Xpert Xpress SARS-CoV-2/FLU/RSV testing.  Fact Sheet for Patients: BloggerCourse.com  Fact Sheet for Healthcare  Providers: SeriousBroker.it  This test is not yet approved or cleared by the [place] FDA and has been authorized for detection and/or diagnosis of SARS-CoV-2 by FDA under an Emergency Use Authorization (EUA). This EUA will remain in effect (meaning this test can be used) for the duration of the COVID-19 declaration under Section 564(b)(1) of the Act, 21 U.S.C. section 360bbb-3(b)(1), unless the authorization is terminated or revoked.     Resp Syncytial Virus by PCR NEGATIVE NEGATIVE Final    Comment: (NOTE) Fact Sheet for Patients: BloggerCourse.com  Fact Sheet for Healthcare Providers: SeriousBroker.it  This test is not yet approved or cleared by the [place] FDA and has been authorized for detection and/or diagnosis of SARS-CoV-2 by FDA under an Emergency Use Authorization (EUA). This EUA will remain in effect (meaning this test can be used) for the duration of the COVID-19 declaration under Section 564(b)(1) of the Act, 21 U.S.C. section 360bbb-3(b)(1), unless the authorization is terminated or revoked.  Performed at [facility], [address] [place], [place] [address]   Blood Culture (routine x 2)     Status: Abnormal   Collection Time: 11/22/22  9:57 PM   Specimen: BLOOD  Result Value Ref Range Status   Specimen Description   Final    BLOOD BLOOD LEFT WRIST Performed at [facility], [address] [place], [place] [address]    Special Requests   Final    BOTTLES DRAWN AEROBIC AND ANAEROBIC Blood Culture adequate volume Performed at [facility], [address] [place], [place] [address]    Culture  Setup Time   Final    Organism ID to follow GRAM POSITIVE COCCI AEROBIC BOTTLE ONLY CRITICAL RESULT CALLED TO, READ BACK BY AND VERIFIED WITH:  C/ EMELIE STEINBOCK AT 2100 11/23/22 JG    Culture (A)  Final    STAPHYLOCOCCUS EPIDERMIDIS THE  SIGNIFICANCE OF ISOLATING THIS ORGANISM FROM A SINGLE SET OF BLOOD CULTURES WHEN MULTIPLE SETS ARE DRAWN IS UNCERTAIN. PLEASE NOTIFY THE MICROBIOLOGY DEPARTMENT WITHIN ONE WEEK IF SPECIATION AND SENSITIVITIES ARE REQUIRED. Performed at [facility], [address], [place], [place] [address]    Report Status 11/25/2022 FINAL  Final  Blood Culture (routine x 2)     Status: Abnormal (Preliminary result)   Collection Time: 11/22/22  9:57 PM   Specimen: BLOOD  Result Value Ref Range Status   Specimen Description   Final    BLOOD BLOOD LEFT FOREARM Performed at [facility], [address], [place], [place] [address]    Special Requests   Final    BOTTLES DRAWN AEROBIC AND ANAEROBIC Blood Culture adequate volume Performed at [facility], [address], [place], [place] [address]    Culture  Setup  Time   Final    GRAM NEGATIVE RODS IN BOTH AEROBIC AND ANAEROBIC BOTTLES CRITICAL RESULT CALLED TO, READ BACK BY AND VERIFIED WITH:  JASON ROBINS AT 2330 11/23/22 JG    Culture KLEBSIELLA OXYTOCA (A)  Final   Report Status PENDING  Incomplete  Blood Culture ID Panel (Reflexed)     Status: Abnormal   Collection Time: 11/22/22  9:57 PM  Result Value Ref Range Status   Enterococcus faecalis NOT DETECTED NOT DETECTED Final   Enterococcus Faecium NOT DETECTED NOT DETECTED Final   Listeria monocytogenes NOT DETECTED NOT DETECTED Final   Staphylococcus species DETECTED (A) NOT DETECTED Final    Comment: CRITICAL RESULT CALLED TO, READ BACK BY AND VERIFIED WITH:  EMELIE STEINBOCK AT 2100 11/23/22 JG    Staphylococcus aureus (BCID) NOT DETECTED NOT DETECTED Final   Staphylococcus epidermidis DETECTED (A) NOT DETECTED Final    Comment: Methicillin (oxacillin) resistant coagulase negative staphylococcus. Possible blood culture contaminant (unless isolated from more than one blood culture draw or clinical case suggests pathogenicity). No antibiotic treatment is indicated for  blood  culture contaminants. CRITICAL RESULT CALLED TO, READ BACK BY AND VERIFIED WITH:  EMELIE STEINBOCK AT 2100 11/23/22 JG    Staphylococcus lugdunensis NOT DETECTED NOT DETECTED Final   Streptococcus species NOT DETECTED NOT DETECTED Final   Streptococcus agalactiae NOT DETECTED NOT DETECTED Final   Streptococcus pneumoniae NOT DETECTED NOT DETECTED Final   Streptococcus pyogenes NOT DETECTED NOT DETECTED Final   A.calcoaceticus-baumannii NOT DETECTED NOT DETECTED Final   Bacteroides fragilis NOT DETECTED NOT DETECTED Final   Enterobacterales NOT DETECTED NOT DETECTED Final   Enterobacter cloacae complex NOT DETECTED NOT DETECTED Final   Escherichia coli NOT DETECTED NOT DETECTED Final   Klebsiella aerogenes NOT DETECTED NOT DETECTED Final   Klebsiella oxytoca NOT DETECTED NOT DETECTED Final   Klebsiella pneumoniae NOT DETECTED NOT DETECTED Final   Proteus species NOT DETECTED NOT DETECTED Final   Salmonella species NOT DETECTED NOT DETECTED Final   Serratia marcescens NOT DETECTED NOT DETECTED Final   Haemophilus influenzae NOT DETECTED NOT DETECTED Final   Neisseria meningitidis NOT DETECTED NOT DETECTED Final   Pseudomonas aeruginosa NOT DETECTED NOT DETECTED Final   Stenotrophomonas maltophilia NOT DETECTED NOT DETECTED Final   Candida albicans NOT DETECTED NOT DETECTED Final   Candida auris NOT DETECTED NOT DETECTED Final   Candida glabrata NOT DETECTED NOT DETECTED Final   Candida krusei NOT DETECTED NOT DETECTED Final   Candida parapsilosis NOT DETECTED NOT DETECTED Final   Candida tropicalis NOT DETECTED NOT DETECTED Final   Cryptococcus neoformans/gattii NOT DETECTED NOT DETECTED Final   Methicillin resistance mecA/C DETECTED (A) NOT DETECTED Final    Comment: CRITICAL RESULT CALLED TO, READ BACK BY AND VERIFIED WITH:  EMELIE STEINBOCK AT 2100 11/23/22 JG Performed at [facility], [address] [place], [place] [address]   Blood Culture ID Panel (Reflexed)      Status: Abnormal   Collection Time: 11/22/22  9:57 PM  Result Value Ref Range Status   Enterococcus faecalis NOT DETECTED NOT DETECTED Final   Enterococcus Faecium NOT DETECTED NOT DETECTED Final   Listeria monocytogenes NOT DETECTED NOT DETECTED Final   Staphylococcus species NOT DETECTED NOT DETECTED Final   Staphylococcus aureus (BCID) NOT DETECTED NOT DETECTED Final   Staphylococcus epidermidis NOT DETECTED NOT DETECTED Final   Staphylococcus lugdunensis NOT DETECTED NOT DETECTED Final   Streptococcus species NOT DETECTED NOT DETECTED Final   Streptococcus agalactiae NOT  DETECTED NOT DETECTED Final   Streptococcus pneumoniae NOT DETECTED NOT DETECTED Final   Streptococcus pyogenes NOT DETECTED NOT DETECTED Final   A.calcoaceticus-baumannii NOT DETECTED NOT DETECTED Final   Bacteroides fragilis NOT DETECTED NOT DETECTED Final   Enterobacterales DETECTED (A) NOT DETECTED Final    Comment: Enterobacterales represent a large order of gram negative bacteria, not a single organism. CRITICAL RESULT CALLED TO, READ BACK BY AND VERIFIED WITH:  JASON ROBINS AT 2330 11/23/22 JG    Enterobacter cloacae complex NOT DETECTED NOT DETECTED Final   Escherichia coli NOT DETECTED NOT DETECTED Final   Klebsiella aerogenes NOT DETECTED NOT DETECTED Final   Klebsiella oxytoca DETECTED (A) NOT DETECTED Final    Comment: CRITICAL RESULT CALLED TO, READ BACK BY AND VERIFIED WITH:  JASON ROBINS AT 2330 11/23/22 JG    Klebsiella pneumoniae NOT DETECTED NOT DETECTED Final   Proteus species NOT DETECTED NOT DETECTED Final   Salmonella species NOT DETECTED NOT DETECTED Final   Serratia marcescens NOT DETECTED NOT DETECTED Final   Haemophilus influenzae NOT DETECTED NOT DETECTED Final   Neisseria meningitidis NOT DETECTED NOT DETECTED Final   Pseudomonas aeruginosa NOT DETECTED NOT DETECTED Final   Stenotrophomonas maltophilia NOT DETECTED NOT DETECTED Final   Candida albicans NOT DETECTED NOT DETECTED  Final   Candida auris NOT DETECTED NOT DETECTED Final   Candida glabrata NOT DETECTED NOT DETECTED Final   Candida krusei NOT DETECTED NOT DETECTED Final   Candida parapsilosis NOT DETECTED NOT DETECTED Final   Candida tropicalis NOT DETECTED NOT DETECTED Final   Cryptococcus neoformans/gattii NOT DETECTED NOT DETECTED Final   CTX-M ESBL NOT DETECTED NOT DETECTED Final   Carbapenem resistance IMP NOT DETECTED NOT DETECTED Final   Carbapenem resistance KPC NOT DETECTED NOT DETECTED Final   Carbapenem resistance NDM NOT DETECTED NOT DETECTED Final   Carbapenem resist OXA 48 LIKE NOT DETECTED NOT DETECTED Final   Carbapenem resistance VIM NOT DETECTED NOT DETECTED Final    Comment: Performed at [facility], [address] [place], [place] [address]  Respiratory (~20 pathogens) panel by PCR     Status: None   Collection Time: 11/23/22 11:57 AM   Specimen: Nasopharyngeal Swab; Respiratory  Result Value Ref Range Status   Adenovirus NOT DETECTED NOT DETECTED Final   Coronavirus 229E NOT DETECTED NOT DETECTED Final    Comment: (NOTE) The Coronavirus on the Respiratory Panel, DOES NOT test for the novel  Coronavirus (2019 nCoV)    Coronavirus HKU1 NOT DETECTED NOT DETECTED Final   Coronavirus NL63 NOT DETECTED NOT DETECTED Final   Coronavirus OC43 NOT DETECTED NOT DETECTED Final   Metapneumovirus NOT DETECTED NOT DETECTED Final   Rhinovirus / Enterovirus NOT DETECTED NOT DETECTED Final   Influenza A NOT DETECTED NOT DETECTED Final   Influenza B NOT DETECTED NOT DETECTED Final   Parainfluenza Virus 1 NOT DETECTED NOT DETECTED Final   Parainfluenza Virus 2 NOT DETECTED NOT DETECTED Final   Parainfluenza Virus 3 NOT DETECTED NOT DETECTED Final   Parainfluenza Virus 4 NOT DETECTED NOT DETECTED Final   Respiratory Syncytial Virus NOT DETECTED NOT DETECTED Final   Bordetella pertussis NOT DETECTED NOT DETECTED Final   Bordetella Parapertussis NOT DETECTED NOT DETECTED Final    Chlamydophila pneumoniae NOT DETECTED NOT DETECTED Final   Mycoplasma pneumoniae NOT DETECTED NOT DETECTED Final    Comment: Performed at [facility], [address] [place], [place] [address]    IMAGING RESULTS:  CT chest /  abd/pelvis showed punctate renal calculi, non obstructive GB stone, prostamegaly I have personally reviewed the films ? Impression/Recommendation ? ?Fever and weakness- klebsiella oxytoca bacteremia Source unclear- UA N- will get Post void bladder scan HE does have BPH Has non obstructive renal calculi and GB stones Continue ceftriaxone for minimum 7 days Staph epidermidis in blood culture is likely a contaminant- no treatment needed  BPH  HTN  H/o MSSA bacteremia in 2023? ___________________________________________________ Discussed with patient, requesting provider Note:  This document was prepared using Dragon voice recognition software and may include unintentional dictation errors.

## 2022-11-25 NOTE — TOC Initial Note (Addendum)
Transition of Care [facility]) - Initial/Assessment Note    Patient Details  Name: John Booker MRN: [id number] Date of Birth: [birth year]  Transition of Care [facility]) CM/SW Contact:    Allena Katz, LCSW Phone Number: 11/25/2022, 3:03 PM  Clinical Narrative  CSW spoke with patient about rehab. Pt reports that he walks just fine. Pt states "what even is physical therapy, do I just do jumping jacks, no one has worked with me". Pt states he walks with a RW at home but his nephew lives next door. He reports he has a primary care doctor he see but doesn't know who it is and states that his daughter would know. Pt reports that he has no transportation barriers or needs and he wants to go home. Pt also declines need for [facility] and outpatient.    Expected Discharge Plan: Home/Self Care Barriers to Discharge: Continued Medical Work up   Patient Goals and CMS Choice Patient states their goals for this hospitalization and ongoing recovery are:: return home          Expected Discharge Plan and Services                                              Prior Living Arrangements/Services   Lives with:: Self   Do you feel safe going back to the place where you live?: Yes               Activities of Daily Living Home Assistive Devices/Equipment: Dan Humphreys (specify type) ADL Screening (condition at time of admission) Patient's cognitive ability adequate to safely complete daily activities?: Yes Is the patient deaf or have difficulty hearing?: Yes Does the patient have difficulty seeing, even when wearing glasses/contacts?: No Does the patient have difficulty concentrating, remembering, or making decisions?: Yes Patient able to express need for assistance with ADLs?: Yes Does the patient have difficulty dressing or bathing?: Yes Independently performs ADLs?: Yes (appropriate for developmental age) Does the patient have difficulty walking or climbing stairs?: Yes Weakness of Legs:  Both Weakness of Arms/Hands: None  Permission Sought/Granted                  Emotional Assessment       Orientation: : Oriented to Self, Oriented to Place, Oriented to  Time, Oriented to Situation      Admission diagnosis:  Confusion [R41.0] Sepsis (HCC) [A41.9] Fever, unspecified fever cause [R50.9] Patient Active Problem List   Diagnosis Date Noted   Sepsis (HCC) 11/23/2022   Acute metabolic encephalopathy 11/23/2022   History of MSSA bacteremia secondary to foot wounds, s/p treatment 9/13 - 12/28/21 02/18/2022   SIRS (systemic inflammatory response syndrome) (HCC) 02/18/2022   Mediastinal mass 02/18/2022   Benign paraspinal mass 11/20/2021   Staphylococcal arthritis of vertebra (HCC)    Acute renal insufficiency    Non-traumatic rhabdomyolysis    GERD without esophagitis 11/16/2021   Hypokalemia 11/16/2021   Hyponatremia 11/16/2021   Bacteremia due to methicillin susceptible Staphylococcus aureus (MSSA) 11/16/2021   AKI (acute kidney injury) (HCC) 11/15/2021   Stage 3a chronic kidney disease (HCC) 11/07/2021   Protrusion of cervical intervertebral disc 07/23/2021   Exertional dyspnea 07/04/2021   History of alcohol abuse 07/04/2021   History of tobacco abuse 07/04/2021   Chronic diastolic CHF (congestive heart failure) (HCC) 06/26/2021   Headache 06/26/2021   Low serum albumin 05/10/2021  Malnutrition of moderate degree 12/28/2020   Esophageal dysmotility 12/28/2020   Generalized weakness 12/26/2020   COVID-19 vaccination declined 03/21/2020   Overweight (BMI 25.0-29.9) 12/20/2019   Blister of skin 11/07/2019   Neuropathy 04/04/2019   Pre-ulcerative calluses 10/07/2018   Skin lesion 10/07/2018   Plantar flexed metatarsal bone of right foot 10/07/2018   Hiatal hernia    Stomach irritation    Intractable hiccups 10/16/2017   Elevated serum creatinine 07/15/2017   Bilateral lower extremity edema 05/08/2017   Alcohol abuse 03/06/2017   Alcohol dependence  in sustained full remission (HCC) 03/05/2017   Essential hypertension 11/15/2015   Vitamin D deficiency 11/15/2015   PCP:  Marisue Ivan, MD Pharmacy:   [facility] - [place], [place] - [address] [place] [address] Phone: [phone] Fax: [phone]  [facility] [address] [place] - [address] [place] [address] Phone: [phone] Fax: [phone]     Social Determinants of Health (SDOH) Social History: SDOH Screenings   Food Insecurity: No Food Insecurity (11/23/2022)  Housing: Low Risk  (11/23/2022)  Transportation Needs: No Transportation Needs (11/23/2022)  Utilities: Not At Risk (11/23/2022)  Depression (PHQ2-9): Low Risk  (12/12/2021)  Tobacco Use: Medium Risk (11/23/2022)   SDOH Interventions:     Readmission Risk Interventions     No data to display

## 2022-11-25 NOTE — Progress Notes (Signed)
Pharmacy Antibiotic Note  John Booker is a 72 y.o. male admitted on 11/22/2022 with sepsis, found to have bacteremia. Past medical history includes MSSA bacteremia in September 2023 secondary to foot wounds, diastolic CHF (EF 78-29%), hypertension, intractable hiccups.  Pharmacy has been consulted for vancomycin dosing.  Plan: Continue vancomycin 1250 mg every 12 hours Goal AUC 400-550 Estimated AUC 497.1, Scr 0.94, IBW, Vd 0.72 L/kg  Also on ceftriaxone 2 grams every 24 hours  Follow up renal function for dose adjustments.   Height: 6\' 3"  (190.5 cm) Weight:  (pt refused to lay flat in order to weigh him in the bed) IBW/kg (Calculated) : 84.5  Temp (24hrs), Avg:98 F (36.7 C), Min:97.8 F (36.6 C), Max:98.2 F (36.8 C)  Recent Labs  Lab 11/22/22 2157 11/23/22 0320 11/23/22 0922 11/24/22 0332 11/25/22 0518  WBC 8.3  --  7.4 5.3 4.4  CREATININE 1.15  --  0.96 0.85 0.94  LATICACIDVEN 2.4* 1.7  --   --   --     Estimated Creatinine Clearance: 83.7 mL/min (by C-G formula based on SCr of 0.94 mg/dL).    Allergies  Allergen Reactions   Adalat [Nifedipine]     Foot swelling.    Amlodipine Other (See Comments)    Leg swelling    Antimicrobials this admission:  Vancomycin 9/14 >>   Cefepime 9/14 >> 9/16  Ceftriaxone 9/16 >>  Metronidazole 9/14 >> 9/15   Dose adjustments this admission: Vancomycin 2,000 mg every 24 hours  > vancomycin 1250 mg every 12 hours   Microbiology results: 9/14 BCx: Staphylococcus epidermidis 1/4 bottles (mecA resistance detected), Klebsiella oxytoca 2/4 bottles (no resistance detected) 9/15 Respiratory PCR: not detected  Thank you for allowing pharmacy to be a part of this patient's care.   Elliot Gurney, PharmD, BCPS Clinical Pharmacist  11/25/2022 10:50 AM

## 2022-11-26 DIAGNOSIS — R7881 Bacteremia: Secondary | ICD-10-CM | POA: Diagnosis not present

## 2022-11-26 LAB — CULTURE, BLOOD (ROUTINE X 2): Special Requests: ADEQUATE

## 2022-11-26 MED ORDER — FUROSEMIDE 40 MG PO TABS: 40.0000 mg | ORAL_TABLET | Freq: Every day | ORAL | 2 refills | Status: DC

## 2022-11-26 MED ORDER — CIPROFLOXACIN HCL 500 MG PO TABS: 500.0000 mg | ORAL_TABLET | Freq: Two times a day (BID) | ORAL | 0 refills | Status: AC

## 2022-11-26 MED ORDER — LOSARTAN POTASSIUM 50 MG PO TABS
100.0000 mg | ORAL_TABLET | Freq: Every day | ORAL | Status: DC
  Administered 2022-11-26: 100 mg via ORAL
  Filled 2022-11-26: qty 2

## 2022-11-26 MED ORDER — FUROSEMIDE 40 MG PO TABS
40.0000 mg | ORAL_TABLET | Freq: Every day | ORAL | Status: DC
  Administered 2022-11-26: 40 mg via ORAL
  Filled 2022-11-26: qty 1

## 2022-11-26 MED ORDER — CHLORPROMAZINE HCL 25 MG PO TABS: 25.0000 mg | ORAL_TABLET | Freq: Every day | ORAL | 0 refills | Status: DC | PRN

## 2022-11-26 MED ORDER — POTASSIUM CHLORIDE CRYS ER 20 MEQ PO TBCR
40.0000 meq | EXTENDED_RELEASE_TABLET | Freq: Once | ORAL | Status: AC
  Administered 2022-11-26: 40 meq via ORAL
  Filled 2022-11-26: qty 2

## 2022-11-26 MED ORDER — METOPROLOL SUCCINATE ER 25 MG PO TB24: 25.0000 mg | ORAL_TABLET | Freq: Every day | ORAL | 2 refills | Status: AC

## 2022-11-26 MED ORDER — METOPROLOL SUCCINATE ER 25 MG PO TB24
25.0000 mg | ORAL_TABLET | Freq: Every day | ORAL | Status: DC
  Administered 2022-11-26: 25 mg via ORAL
  Filled 2022-11-26: qty 1

## 2022-11-26 MED ORDER — BUTALBITAL-APAP-CAFFEINE 50-325-40 MG PO TABS
2.0000 | ORAL_TABLET | Freq: Once | ORAL | Status: AC
  Administered 2022-11-26: 2 via ORAL
  Filled 2022-11-26: qty 2

## 2022-11-26 NOTE — Progress Notes (Signed)
Physical Therapy Treatment Patient Details Name: John Booker MRN: [id number] DOB: 02/03/1951 Today's Date: 11/26/2022   History of Present Illness Patient is a 72 y.o. male with medical history significant for Diastolic CHF (EF 60 to 65%, G1 DD, 11/2021), HTN, intractable hiccups followed by GI, mediastinal mass followed by oncology last seen 10/2021,  MSSA bacteremia 11/2021 from foot wounds (MRI at that time showed fluid collection L5-S1-septic arthritis difficult to exclude), who presents to the ED with fever, weakness and altered mental status that started the day of arrival. Current MD assessment includes: Sepsis, acute metabolic, hypokalemia, and Acute metabolic encephalopathy.    PT Comments  Patient received in bed, daughter in room. He is agreeable to PT session. Patient is mod I with bed mobility. Assistance needed for donning socks and shorts prior to ambulation. He stands with cga. Ambulated 150 feet with RW and cga. Patient without lob. He will continue to benefit from skilled PT to improve functional independence and safety.     If plan is discharge home, recommend the following: A little help with walking and/or transfers;A little help with bathing/dressing/bathroom;Assist for transportation;Help with stairs or ramp for entrance   Can travel by private vehicle      yes  Equipment Recommendations  None recommended by PT    Recommendations for Other Services       Precautions / Restrictions Precautions Precautions: Fall Restrictions Weight Bearing Restrictions: No     Mobility  Bed Mobility Overal bed mobility: Modified Independent Bed Mobility: Supine to Sit     Supine to sit: Modified independent (Device/Increase time)          Transfers Overall transfer level: Needs assistance Equipment used: Rolling walker (2 wheels) Transfers: Sit to/from Stand Sit to Stand: Contact guard assist                Ambulation/Gait Ambulation/Gait assistance: Contact  guard assist Gait Distance (Feet): 150 Feet Assistive device: Rolling walker (2 wheels) Gait Pattern/deviations: Step-through pattern, Decreased step length - right, Decreased step length - left, Decreased stride length, Shuffle Gait velocity: slightly decreased     General Gait Details: cga for ambulation with RW. No lob.   Stairs             Wheelchair Mobility     Tilt Bed    Modified Rankin (Stroke Patients Only)       Balance Overall balance assessment: Needs assistance Sitting-balance support: Feet supported Sitting balance-Leahy Scale: Good     Standing balance support: Bilateral upper extremity supported, During functional activity, Reliant on assistive device for balance Standing balance-Leahy Scale: Good                              Cognition Arousal: Alert Behavior During Therapy: WFL for tasks assessed/performed, Agitated Overall Cognitive Status: Within Functional Limits for tasks assessed                                          Exercises      General Comments        Pertinent Vitals/Pain Pain Assessment Pain Assessment: No/denies pain    Home Living                          Prior Function  PT Goals (current goals can now be found in the care plan section) Acute Rehab PT Goals Patient Stated Goal: get stronger, go home PT Goal Formulation: With patient/family Time For Goal Achievement: 12/07/22 Potential to Achieve Goals: Good Progress towards PT goals: Progressing toward goals    Frequency    Min 1X/week      PT Plan      Co-evaluation              AM-PAC PT "6 Clicks" Mobility   Outcome Measure  Help needed turning from your back to your side while in a flat bed without using bedrails?: None Help needed moving from lying on your back to sitting on the side of a flat bed without using bedrails?: A Little Help needed moving to and from a bed to a chair  (including a wheelchair)?: A Little Help needed standing up from a chair using your arms (e.g., wheelchair or bedside chair)?: A Little Help needed to walk in hospital room?: A Little Help needed climbing 3-5 steps with a railing? : A Little 6 Click Score: 19    End of Session Equipment Utilized During Treatment: Gait belt Activity Tolerance: Patient tolerated treatment well Patient left: in chair;with call bell/phone within reach;with chair alarm set;with family/visitor present Nurse Communication: Mobility status PT Visit Diagnosis: Muscle weakness (generalized) (M62.81)     Time: 9562-1308 PT Time Calculation (min) (ACUTE ONLY): 18 min  Charges:    $Gait Training: 8-22 mins PT General Charges $$ ACUTE PT VISIT: 1 Visit                     [name], PT, GCS 11/26/22,1:48 PM

## 2022-11-26 NOTE — Care Management Important Message (Signed)
Important Message  Patient Details  Name: John Booker MRN: [id number] Date of Birth: 17-Jul-1950   Medicare Important Message Given:  Yes     [name] [name] 11/26/2022, 11:15 AM

## 2022-11-26 NOTE — Discharge Summary (Addendum)
Physician Discharge Summary   John Booker  male DOB: January 22, 1951  ZHY:[id number]  PCP: Marisue Ivan, MD  Admit date: 11/22/2022 Discharge date: 11/26/2022  Admitted From: home Disposition:  home Daughter updated on the phone prior to discharge.  CODE STATUS: DNR  Discharge Instructions     Discharge instructions   Complete by: As directed    For your bacterial infection in your blood, you have received 4 days of IV antibiotic.  Please finish 3 more days of oral antibiotic Cipro to finish a total of 7-days.  You can take oral thorazine as needed for hiccups.  Your blood pressure has been high.  In addition to Losartan you are already taking, I have resumed your Lasix, and added Toprol.  Please follow up with PCP to continue management of your blood pressure. [facility] Course:  For full details, please see H&P, progress notes, consult notes and ancillary notes.  Briefly,  John Booker is a 72 y.o. male with medical history significant for Diastolic CHF (EF 60 to 65%, G1 DD, 11/2021), HTN, intractable hiccups followed by GI, mediastinal mass followed by oncology last seen 10/2021, MSSA bacteremia 11/2021 from foot wounds (MRI at that time showed fluid collection L5-S1-septic arthritis difficult to exclude), who presented to the ED with fever, weakness and altered mental status that started the day of arrival.     * Severe Sepsis (HCC) 2/2 Kleb oxytoca bacteremia Patient meeting sepsis criteria with fever, tachycardia and tachypnea and lactic acidosis, normal WBC --blood cx came back 2/4 pos for Kleb oxytoca.  Source unclear.  UA neg for infection. --Pt received 4 days of ceftriaxone and discharged on 3 more days of Cipro to complete a 7-day course, per ID rec.   1/4 pos MRSE --ID consulted and deemed it contaminant.     Acute metabolic encephalopathy Secondary to sepsis.    Hyponatremia Sodium 129 on presentation.  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   --Not on lasix PTA, but resumed now for BP control --added Toprol  Intractable hiccups --pt reported continuous hiccups for 4 years, and was having active hiccups, so IV thorazine was given with apparent resolution of his hiccups.  Oral thorazine ordered PRN at discharge.  Headache --Pt was given tylenol and Fioricet for headache during hospitalization.   --Home med list documented pt was not taking ASA or tylenol PTA, so d/c med rec reflected that.   Unless noted above, medications under "STOP" list are ones pt was not taking PTA.  Discharge Diagnoses:  Principal Problem:   Sepsis (HCC) Active Problems:   History of MSSA bacteremia secondary to foot wounds, s/p treatment 9/13 - 12/28/21   Acute metabolic encephalopathy   Hypokalemia   Hyponatremia   Mediastinal mass   Essential hypertension   Chronic diastolic CHF (congestive heart failure) (HCC)   30 Day Unplanned Readmission Risk Score    Flowsheet Row ED to Hosp-Admission (Current) from 11/22/2022 in [facility] 1C MEDICAL TELEMETRY  30 Day Unplanned Readmission Risk Score (%) 13.57 Filed at 11/26/2022 1200       This score is the patient's risk of  Physician Discharge Summary   John Booker  male DOB: January 22, 1951  [id number]  PCP: Marisue Ivan, MD  Admit date: 11/22/2022 Discharge date: 11/26/2022  Admitted From: home Disposition:  home Daughter updated on the phone prior to discharge.  CODE STATUS: DNR  Discharge Instructions     Discharge instructions   Complete by: As directed    For your bacterial infection in your blood, you have received 4 days of IV antibiotic.  Please finish 3 more days of oral antibiotic Cipro to finish a total of 7-days.  You can take oral thorazine as needed for hiccups.  Your blood pressure has been high.  In addition to Losartan you are already taking, I have resumed your Lasix, and added Toprol.  Please follow up with PCP to continue management of your blood pressure. [facility] Course:  For full details, please see H&P, progress notes, consult notes and ancillary notes.  Briefly,  John Booker is a 72 y.o. male with medical history significant for Diastolic CHF (EF 60 to 65%, G1 DD, 11/2021), HTN, intractable hiccups followed by GI, mediastinal mass followed by oncology last seen 10/2021, MSSA bacteremia 11/2021 from foot wounds (MRI at that time showed fluid collection L5-S1-septic arthritis difficult to exclude), who presented to the ED with fever, weakness and altered mental status that started the day of arrival.     * Severe Sepsis (HCC) 2/2 Kleb oxytoca bacteremia Patient meeting sepsis criteria with fever, tachycardia and tachypnea and lactic acidosis, normal WBC --blood cx came back 2/4 pos for Kleb oxytoca.  Source unclear.  UA neg for infection. --Pt received 4 days of ceftriaxone and discharged on 3 more days of Cipro to complete a 7-day course, per ID rec.   1/4 pos MRSE --ID consulted and deemed it contaminant.     Acute metabolic encephalopathy Secondary to sepsis.    Hyponatremia Sodium 129 on presentation.  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   --Not on lasix PTA, but resumed now for BP control --added Toprol  Intractable hiccups --pt reported continuous hiccups for 4 years, and was having active hiccups, so IV thorazine was given with apparent resolution of his hiccups.  Oral thorazine ordered PRN at discharge.  Headache --Pt was given tylenol and Fioricet for headache during hospitalization.   --Home med list documented pt was not taking ASA or tylenol PTA, so d/c med rec reflected that.   Unless noted above, medications under "STOP" list are ones pt was not taking PTA.  Discharge Diagnoses:  Principal Problem:   Sepsis (HCC) Active Problems:   History of MSSA bacteremia secondary to foot wounds, s/p treatment 9/13 - 12/28/21   Acute metabolic encephalopathy   Hypokalemia   Hyponatremia   Mediastinal mass   Essential hypertension   Chronic diastolic CHF (congestive heart failure) (HCC)   30 Day Unplanned Readmission Risk Score    Flowsheet Row ED to Hosp-Admission (Current) from 11/22/2022 in [facility] 1C MEDICAL TELEMETRY  30 Day Unplanned Readmission Risk Score (%) 13.57 Filed at 11/26/2022 1200       This score is the patient's risk of  Physician Discharge Summary   John Booker  male DOB: January 22, 1951  [id number]  PCP: Marisue Ivan, MD  Admit date: 11/22/2022 Discharge date: 11/26/2022  Admitted From: home Disposition:  home Daughter updated on the phone prior to discharge.  CODE STATUS: DNR  Discharge Instructions     Discharge instructions   Complete by: As directed    For your bacterial infection in your blood, you have received 4 days of IV antibiotic.  Please finish 3 more days of oral antibiotic Cipro to finish a total of 7-days.  You can take oral thorazine as needed for hiccups.  Your blood pressure has been high.  In addition to Losartan you are already taking, I have resumed your Lasix, and added Toprol.  Please follow up with PCP to continue management of your blood pressure. [facility] Course:  For full details, please see H&P, progress notes, consult notes and ancillary notes.  Briefly,  John Booker is a 72 y.o. male with medical history significant for Diastolic CHF (EF 60 to 65%, G1 DD, 11/2021), HTN, intractable hiccups followed by GI, mediastinal mass followed by oncology last seen 10/2021, MSSA bacteremia 11/2021 from foot wounds (MRI at that time showed fluid collection L5-S1-septic arthritis difficult to exclude), who presented to the ED with fever, weakness and altered mental status that started the day of arrival.     * Severe Sepsis (HCC) 2/2 Kleb oxytoca bacteremia Patient meeting sepsis criteria with fever, tachycardia and tachypnea and lactic acidosis, normal WBC --blood cx came back 2/4 pos for Kleb oxytoca.  Source unclear.  UA neg for infection. --Pt received 4 days of ceftriaxone and discharged on 3 more days of Cipro to complete a 7-day course, per ID rec.   1/4 pos MRSE --ID consulted and deemed it contaminant.     Acute metabolic encephalopathy Secondary to sepsis.    Hyponatremia Sodium 129 on presentation.  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   --Not on lasix PTA, but resumed now for BP control --added Toprol  Intractable hiccups --pt reported continuous hiccups for 4 years, and was having active hiccups, so IV thorazine was given with apparent resolution of his hiccups.  Oral thorazine ordered PRN at discharge.  Headache --Pt was given tylenol and Fioricet for headache during hospitalization.   --Home med list documented pt was not taking ASA or tylenol PTA, so d/c med rec reflected that.   Unless noted above, medications under "STOP" list are ones pt was not taking PTA.  Discharge Diagnoses:  Principal Problem:   Sepsis (HCC) Active Problems:   History of MSSA bacteremia secondary to foot wounds, s/p treatment 9/13 - 12/28/21   Acute metabolic encephalopathy   Hypokalemia   Hyponatremia   Mediastinal mass   Essential hypertension   Chronic diastolic CHF (congestive heart failure) (HCC)   30 Day Unplanned Readmission Risk Score    Flowsheet Row ED to Hosp-Admission (Current) from 11/22/2022 in [facility] 1C MEDICAL TELEMETRY  30 Day Unplanned Readmission Risk Score (%) 13.57 Filed at 11/26/2022 1200       This score is the patient's risk of  an unplanned readmission within 30 days of being discharged (0 -100%). The score is based on dignosis, age, lab data, medications, orders, and past utilization.   Low:  0-14.9   Medium: 15-21.9   High: 22-29.9   Extreme: 30 and above         Discharge Instructions:  Allergies as of 11/26/2022       Reactions   Adalat [nifedipine]    Foot swelling.    Amlodipine Other (See Comments)   Leg  swelling        Medication List     STOP taking these medications    acetaminophen 325 MG tablet Commonly known as: TYLENOL   aspirin EC 81 MG tablet   baclofen 10 MG tablet Commonly known as: LIORESAL   cyanocobalamin 1000 MCG tablet   escitalopram 5 MG tablet Commonly known as: LEXAPRO   Magnesium 200 MG Chew   multivitamin with minerals Tabs tablet       TAKE these medications    chlorproMAZINE 25 MG tablet Commonly known as: THORAZINE Take 1 tablet (25 mg total) by mouth daily as needed for hiccoughs.   ciprofloxacin 500 MG tablet Commonly known as: Cipro Take 1 tablet (500 mg total) by mouth 2 (two) times daily for 3 days.   furosemide 40 MG tablet Commonly known as: LASIX Take 1 tablet (40 mg total) by mouth daily.   gabapentin 100 MG capsule Commonly known as: NEURONTIN TAKE 1 CAPSULE BY MOUTH 3 TIMES DAILY   losartan 100 MG tablet Commonly known as: COZAAR Take 1 tablet (100 mg total) by mouth daily.   metoprolol succinate 25 MG 24 hr tablet Commonly known as: TOPROL-XL Take 1 tablet (25 mg total) by mouth daily. Start taking on: November 27, 2022   Nasal Relief 0.05 % nasal spray Generic drug: oxymetazoline Place 1 spray into both nostrils 2 (two) times daily as needed.   ondansetron 4 MG tablet Commonly known as: ZOFRAN Take 1 tablet (4 mg total) by mouth every 8 (eight) hours as needed for nausea or vomiting.   pantoprazole 40 MG tablet Commonly known as: PROTONIX TAKE ONE (1) TABLET BY MOUTH TWO TIMES PER DAY   potassium chloride SA 20 MEQ tablet Commonly known as: KLOR-CON M Take 1 tablet (20 mEq total) by mouth daily.   sodium chloride 0.65 % Soln nasal spray Commonly known as: OCEAN Place 1 spray into both nostrils as needed for congestion (nose irritation).         Follow-up Information     Marisue Ivan, MD Follow up in 1 week(s).   Specialty: Family Medicine Contact information: [address] [facility] [place] [address] [phone]                 Allergies  Allergen Reactions   Adalat [Nifedipine]     Foot swelling.    Amlodipine Other (See Comments)    Leg swelling     The results of significant diagnostics from this hospitalization (including imaging, microbiology, ancillary and laboratory) are listed below for reference.   Consultations:   Procedures/Studies: ECHOCARDIOGRAM COMPLETE  Result Date: 11/24/2022    ECHOCARDIOGRAM REPORT   Patient Name:   NENO ASKIN Date of Exam: 11/24/2022 Medical Rec #:  [id number]      Height:       75.0 in Accession #:    [id number]     Weight:       208.6 lb Date of Birth:  09/20/1950  Physician Discharge Summary   John Booker  male DOB: January 22, 1951  [id number]  PCP: Marisue Ivan, MD  Admit date: 11/22/2022 Discharge date: 11/26/2022  Admitted From: home Disposition:  home Daughter updated on the phone prior to discharge.  CODE STATUS: DNR  Discharge Instructions     Discharge instructions   Complete by: As directed    For your bacterial infection in your blood, you have received 4 days of IV antibiotic.  Please finish 3 more days of oral antibiotic Cipro to finish a total of 7-days.  You can take oral thorazine as needed for hiccups.  Your blood pressure has been high.  In addition to Losartan you are already taking, I have resumed your Lasix, and added Toprol.  Please follow up with PCP to continue management of your blood pressure. [facility] Course:  For full details, please see H&P, progress notes, consult notes and ancillary notes.  Briefly,  John Booker is a 72 y.o. male with medical history significant for Diastolic CHF (EF 60 to 65%, G1 DD, 11/2021), HTN, intractable hiccups followed by GI, mediastinal mass followed by oncology last seen 10/2021, MSSA bacteremia 11/2021 from foot wounds (MRI at that time showed fluid collection L5-S1-septic arthritis difficult to exclude), who presented to the ED with fever, weakness and altered mental status that started the day of arrival.     * Severe Sepsis (HCC) 2/2 Kleb oxytoca bacteremia Patient meeting sepsis criteria with fever, tachycardia and tachypnea and lactic acidosis, normal WBC --blood cx came back 2/4 pos for Kleb oxytoca.  Source unclear.  UA neg for infection. --Pt received 4 days of ceftriaxone and discharged on 3 more days of Cipro to complete a 7-day course, per ID rec.   1/4 pos MRSE --ID consulted and deemed it contaminant.     Acute metabolic encephalopathy Secondary to sepsis.    Hyponatremia Sodium 129 on presentation.  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   --Not on lasix PTA, but resumed now for BP control --added Toprol  Intractable hiccups --pt reported continuous hiccups for 4 years, and was having active hiccups, so IV thorazine was given with apparent resolution of his hiccups.  Oral thorazine ordered PRN at discharge.  Headache --Pt was given tylenol and Fioricet for headache during hospitalization.   --Home med list documented pt was not taking ASA or tylenol PTA, so d/c med rec reflected that.   Unless noted above, medications under "STOP" list are ones pt was not taking PTA.  Discharge Diagnoses:  Principal Problem:   Sepsis (HCC) Active Problems:   History of MSSA bacteremia secondary to foot wounds, s/p treatment 9/13 - 12/28/21   Acute metabolic encephalopathy   Hypokalemia   Hyponatremia   Mediastinal mass   Essential hypertension   Chronic diastolic CHF (congestive heart failure) (HCC)   30 Day Unplanned Readmission Risk Score    Flowsheet Row ED to Hosp-Admission (Current) from 11/22/2022 in [facility] 1C MEDICAL TELEMETRY  30 Day Unplanned Readmission Risk Score (%) 13.57 Filed at 11/26/2022 1200       This score is the patient's risk of  an unplanned readmission within 30 days of being discharged (0 -100%). The score is based on dignosis, age, lab data, medications, orders, and past utilization.   Low:  0-14.9   Medium: 15-21.9   High: 22-29.9   Extreme: 30 and above         Discharge Instructions:  Allergies as of 11/26/2022       Reactions   Adalat [nifedipine]    Foot swelling.    Amlodipine Other (See Comments)   Leg  swelling        Medication List     STOP taking these medications    acetaminophen 325 MG tablet Commonly known as: TYLENOL   aspirin EC 81 MG tablet   baclofen 10 MG tablet Commonly known as: LIORESAL   cyanocobalamin 1000 MCG tablet   escitalopram 5 MG tablet Commonly known as: LEXAPRO   Magnesium 200 MG Chew   multivitamin with minerals Tabs tablet       TAKE these medications    chlorproMAZINE 25 MG tablet Commonly known as: THORAZINE Take 1 tablet (25 mg total) by mouth daily as needed for hiccoughs.   ciprofloxacin 500 MG tablet Commonly known as: Cipro Take 1 tablet (500 mg total) by mouth 2 (two) times daily for 3 days.   furosemide 40 MG tablet Commonly known as: LASIX Take 1 tablet (40 mg total) by mouth daily.   gabapentin 100 MG capsule Commonly known as: NEURONTIN TAKE 1 CAPSULE BY MOUTH 3 TIMES DAILY   losartan 100 MG tablet Commonly known as: COZAAR Take 1 tablet (100 mg total) by mouth daily.   metoprolol succinate 25 MG 24 hr tablet Commonly known as: TOPROL-XL Take 1 tablet (25 mg total) by mouth daily. Start taking on: November 27, 2022   Nasal Relief 0.05 % nasal spray Generic drug: oxymetazoline Place 1 spray into both nostrils 2 (two) times daily as needed.   ondansetron 4 MG tablet Commonly known as: ZOFRAN Take 1 tablet (4 mg total) by mouth every 8 (eight) hours as needed for nausea or vomiting.   pantoprazole 40 MG tablet Commonly known as: PROTONIX TAKE ONE (1) TABLET BY MOUTH TWO TIMES PER DAY   potassium chloride SA 20 MEQ tablet Commonly known as: KLOR-CON M Take 1 tablet (20 mEq total) by mouth daily.   sodium chloride 0.65 % Soln nasal spray Commonly known as: OCEAN Place 1 spray into both nostrils as needed for congestion (nose irritation).         Follow-up Information     Marisue Ivan, MD Follow up in 1 week(s).   Specialty: Family Medicine Contact information: [address] [facility] [place] [address] [phone]                 Allergies  Allergen Reactions   Adalat [Nifedipine]     Foot swelling.    Amlodipine Other (See Comments)    Leg swelling     The results of significant diagnostics from this hospitalization (including imaging, microbiology, ancillary and laboratory) are listed below for reference.   Consultations:   Procedures/Studies: ECHOCARDIOGRAM COMPLETE  Result Date: 11/24/2022    ECHOCARDIOGRAM REPORT   Patient Name:   NENO ASKIN Date of Exam: 11/24/2022 Medical Rec #:  [id number]      Height:       75.0 in Accession #:    [id number]     Weight:       208.6 lb Date of Birth:  09/20/1950  Physician Discharge Summary   John Booker  male DOB: January 22, 1951  [id number]  PCP: Marisue Ivan, MD  Admit date: 11/22/2022 Discharge date: 11/26/2022  Admitted From: home Disposition:  home Daughter updated on the phone prior to discharge.  CODE STATUS: DNR  Discharge Instructions     Discharge instructions   Complete by: As directed    For your bacterial infection in your blood, you have received 4 days of IV antibiotic.  Please finish 3 more days of oral antibiotic Cipro to finish a total of 7-days.  You can take oral thorazine as needed for hiccups.  Your blood pressure has been high.  In addition to Losartan you are already taking, I have resumed your Lasix, and added Toprol.  Please follow up with PCP to continue management of your blood pressure. [facility] Course:  For full details, please see H&P, progress notes, consult notes and ancillary notes.  Briefly,  John Booker is a 72 y.o. male with medical history significant for Diastolic CHF (EF 60 to 65%, G1 DD, 11/2021), HTN, intractable hiccups followed by GI, mediastinal mass followed by oncology last seen 10/2021, MSSA bacteremia 11/2021 from foot wounds (MRI at that time showed fluid collection L5-S1-septic arthritis difficult to exclude), who presented to the ED with fever, weakness and altered mental status that started the day of arrival.     * Severe Sepsis (HCC) 2/2 Kleb oxytoca bacteremia Patient meeting sepsis criteria with fever, tachycardia and tachypnea and lactic acidosis, normal WBC --blood cx came back 2/4 pos for Kleb oxytoca.  Source unclear.  UA neg for infection. --Pt received 4 days of ceftriaxone and discharged on 3 more days of Cipro to complete a 7-day course, per ID rec.   1/4 pos MRSE --ID consulted and deemed it contaminant.     Acute metabolic encephalopathy Secondary to sepsis.    Hyponatremia Sodium 129 on presentation.  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   --Not on lasix PTA, but resumed now for BP control --added Toprol  Intractable hiccups --pt reported continuous hiccups for 4 years, and was having active hiccups, so IV thorazine was given with apparent resolution of his hiccups.  Oral thorazine ordered PRN at discharge.  Headache --Pt was given tylenol and Fioricet for headache during hospitalization.   --Home med list documented pt was not taking ASA or tylenol PTA, so d/c med rec reflected that.   Unless noted above, medications under "STOP" list are ones pt was not taking PTA.  Discharge Diagnoses:  Principal Problem:   Sepsis (HCC) Active Problems:   History of MSSA bacteremia secondary to foot wounds, s/p treatment 9/13 - 12/28/21   Acute metabolic encephalopathy   Hypokalemia   Hyponatremia   Mediastinal mass   Essential hypertension   Chronic diastolic CHF (congestive heart failure) (HCC)   30 Day Unplanned Readmission Risk Score    Flowsheet Row ED to Hosp-Admission (Current) from 11/22/2022 in [facility] 1C MEDICAL TELEMETRY  30 Day Unplanned Readmission Risk Score (%) 13.57 Filed at 11/26/2022 1200       This score is the patient's risk of  Physician Discharge Summary   John Booker  male DOB: January 22, 1951  [id number]  PCP: Marisue Ivan, MD  Admit date: 11/22/2022 Discharge date: 11/26/2022  Admitted From: home Disposition:  home Daughter updated on the phone prior to discharge.  CODE STATUS: DNR  Discharge Instructions     Discharge instructions   Complete by: As directed    For your bacterial infection in your blood, you have received 4 days of IV antibiotic.  Please finish 3 more days of oral antibiotic Cipro to finish a total of 7-days.  You can take oral thorazine as needed for hiccups.  Your blood pressure has been high.  In addition to Losartan you are already taking, I have resumed your Lasix, and added Toprol.  Please follow up with PCP to continue management of your blood pressure. [facility] Course:  For full details, please see H&P, progress notes, consult notes and ancillary notes.  Briefly,  John Booker is a 72 y.o. male with medical history significant for Diastolic CHF (EF 60 to 65%, G1 DD, 11/2021), HTN, intractable hiccups followed by GI, mediastinal mass followed by oncology last seen 10/2021, MSSA bacteremia 11/2021 from foot wounds (MRI at that time showed fluid collection L5-S1-septic arthritis difficult to exclude), who presented to the ED with fever, weakness and altered mental status that started the day of arrival.     * Severe Sepsis (HCC) 2/2 Kleb oxytoca bacteremia Patient meeting sepsis criteria with fever, tachycardia and tachypnea and lactic acidosis, normal WBC --blood cx came back 2/4 pos for Kleb oxytoca.  Source unclear.  UA neg for infection. --Pt received 4 days of ceftriaxone and discharged on 3 more days of Cipro to complete a 7-day course, per ID rec.   1/4 pos MRSE --ID consulted and deemed it contaminant.     Acute metabolic encephalopathy Secondary to sepsis.    Hyponatremia Sodium 129 on presentation.  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   --Not on lasix PTA, but resumed now for BP control --added Toprol  Intractable hiccups --pt reported continuous hiccups for 4 years, and was having active hiccups, so IV thorazine was given with apparent resolution of his hiccups.  Oral thorazine ordered PRN at discharge.  Headache --Pt was given tylenol and Fioricet for headache during hospitalization.   --Home med list documented pt was not taking ASA or tylenol PTA, so d/c med rec reflected that.   Unless noted above, medications under "STOP" list are ones pt was not taking PTA.  Discharge Diagnoses:  Principal Problem:   Sepsis (HCC) Active Problems:   History of MSSA bacteremia secondary to foot wounds, s/p treatment 9/13 - 12/28/21   Acute metabolic encephalopathy   Hypokalemia   Hyponatremia   Mediastinal mass   Essential hypertension   Chronic diastolic CHF (congestive heart failure) (HCC)   30 Day Unplanned Readmission Risk Score    Flowsheet Row ED to Hosp-Admission (Current) from 11/22/2022 in [facility] 1C MEDICAL TELEMETRY  30 Day Unplanned Readmission Risk Score (%) 13.57 Filed at 11/26/2022 1200       This score is the patient's risk of  Physician Discharge Summary   John Booker  male DOB: January 22, 1951  ZHY:[id number]  PCP: Marisue Ivan, MD  Admit date: 11/22/2022 Discharge date: 11/26/2022  Admitted From: home Disposition:  home Daughter updated on the phone prior to discharge.  CODE STATUS: DNR  Discharge Instructions     Discharge instructions   Complete by: As directed    For your bacterial infection in your blood, you have received 4 days of IV antibiotic.  Please finish 3 more days of oral antibiotic Cipro to finish a total of 7-days.  You can take oral thorazine as needed for hiccups.  Your blood pressure has been high.  In addition to Losartan you are already taking, I have resumed your Lasix, and added Toprol.  Please follow up with PCP to continue management of your blood pressure. The [facility] Course:  For full details, please see H&P, progress notes, consult notes and ancillary notes.  Briefly,  John Booker is a 72 y.o. male with medical history significant for Diastolic CHF (EF 60 to 65%, G1 DD, 11/2021), HTN, intractable hiccups followed by GI, mediastinal mass followed by oncology last seen 10/2021, MSSA bacteremia 11/2021 from foot wounds (MRI at that time showed fluid collection L5-S1-septic arthritis difficult to exclude), who presented to the ED with fever, weakness and altered mental status that started the day of arrival.     * Severe Sepsis (HCC) 2/2 Kleb oxytoca bacteremia Patient meeting sepsis criteria with fever, tachycardia and tachypnea and lactic acidosis, normal WBC --blood cx came back 2/4 pos for Kleb oxytoca.  Source unclear.  UA neg for infection. --Pt received 4 days of ceftriaxone and discharged on 3 more days of Cipro to complete a 7-day course, per ID rec.   1/4 pos MRSE --ID consulted and deemed it contaminant.     Acute metabolic encephalopathy Secondary to sepsis.    Hyponatremia Sodium 129 on presentation.  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   --Not on lasix PTA, but resumed now for BP control --added Toprol  Intractable hiccups --pt reported continuous hiccups for 4 years, and was having active hiccups, so IV thorazine was given with apparent resolution of his hiccups.  Oral thorazine ordered PRN at discharge.  Headache --Pt was given tylenol and Fioricet for headache during hospitalization.   --Home med list documented pt was not taking ASA or tylenol PTA, so d/c med rec reflected that.   Unless noted above, medications under "STOP" list are ones pt was not taking PTA.  Discharge Diagnoses:  Principal Problem:   Sepsis (HCC) Active Problems:   History of MSSA bacteremia secondary to foot wounds, s/p treatment 9/13 - 12/28/21   Acute metabolic encephalopathy   Hypokalemia   Hyponatremia   Mediastinal mass   Essential hypertension   Chronic diastolic CHF (congestive heart failure) (HCC)   30 Day Unplanned Readmission Risk Score    Flowsheet Row ED to Hosp-Admission (Current) from 11/22/2022 in [facility] 1C MEDICAL TELEMETRY  30 Day Unplanned Readmission Risk Score (%) 13.57 Filed at 11/26/2022 1200       This score is the patient's risk of  an unplanned readmission within 30 days of being discharged (0 -100%). The score is based on dignosis, age, lab data, medications, orders, and past utilization.   Low:  0-14.9   Medium: 15-21.9   High: 22-29.9   Extreme: 30 and above         Discharge Instructions:  Allergies as of 11/26/2022       Reactions   Adalat [nifedipine]    Foot swelling.    Amlodipine Other (See Comments)   Leg  swelling        Medication List     STOP taking these medications    acetaminophen 325 MG tablet Commonly known as: TYLENOL   aspirin EC 81 MG tablet   baclofen 10 MG tablet Commonly known as: LIORESAL   cyanocobalamin 1000 MCG tablet   escitalopram 5 MG tablet Commonly known as: LEXAPRO   Magnesium 200 MG Chew   multivitamin with minerals Tabs tablet       TAKE these medications    chlorproMAZINE 25 MG tablet Commonly known as: THORAZINE Take 1 tablet (25 mg total) by mouth daily as needed for hiccoughs.   ciprofloxacin 500 MG tablet Commonly known as: Cipro Take 1 tablet (500 mg total) by mouth 2 (two) times daily for 3 days.   furosemide 40 MG tablet Commonly known as: LASIX Take 1 tablet (40 mg total) by mouth daily.   gabapentin 100 MG capsule Commonly known as: NEURONTIN TAKE 1 CAPSULE BY MOUTH 3 TIMES DAILY   losartan 100 MG tablet Commonly known as: COZAAR Take 1 tablet (100 mg total) by mouth daily.   metoprolol succinate 25 MG 24 hr tablet Commonly known as: TOPROL-XL Take 1 tablet (25 mg total) by mouth daily. Start taking on: November 27, 2022   Nasal Relief 0.05 % nasal spray Generic drug: oxymetazoline Place 1 spray into both nostrils 2 (two) times daily as needed.   ondansetron 4 MG tablet Commonly known as: ZOFRAN Take 1 tablet (4 mg total) by mouth every 8 (eight) hours as needed for nausea or vomiting.   pantoprazole 40 MG tablet Commonly known as: PROTONIX TAKE ONE (1) TABLET BY MOUTH TWO TIMES PER DAY   potassium chloride SA 20 MEQ tablet Commonly known as: KLOR-CON M Take 1 tablet (20 mEq total) by mouth daily.   sodium chloride 0.65 % Soln nasal spray Commonly known as: OCEAN Place 1 spray into both nostrils as needed for congestion (nose irritation).         Follow-up Information     Marisue Ivan, MD Follow up in 1 week(s).   Specialty: Family Medicine Contact information: [address] [facility] [place] [address] [phone]                 Allergies  Allergen Reactions   Adalat [Nifedipine]     Foot swelling.    Amlodipine Other (See Comments)    Leg swelling     The results of significant diagnostics from this hospitalization (including imaging, microbiology, ancillary and laboratory) are listed below for reference.   Consultations:   Procedures/Studies: ECHOCARDIOGRAM COMPLETE  Result Date: 11/24/2022    ECHOCARDIOGRAM REPORT   Patient Name:   NENO ASKIN Date of Exam: 11/24/2022 Medical Rec #:  [id number]      Height:       75.0 in Accession #:    [id number]     Weight:       208.6 lb Date of Birth:  09/20/1950

## 2022-11-26 NOTE — Progress Notes (Signed)
ID Day 5 of appropriate antibiotic for klebsiella oxytoca No fever since admission Post void bladder scan only 40 Total antibiotic needed 7 days End day 11/28/22 ID will sign off- call if needed

## 2022-11-26 NOTE — Plan of Care (Signed)

## 2022-11-26 NOTE — TOC Transition Note (Signed)
Transition of Care [facility]) - CM/SW Discharge Note   Patient Details  Name: John Booker MRN: [id number] Date of Birth: 1950-06-10  [facility] CM/SW Contact:  Allena Katz, LCSW Phone Number: 11/26/2022, 3:27 PM   Clinical Narrative:   Pt has orders to discharge home. Pt declines all services. No TOC needs.     Final next level of care: Home/Self Care Barriers to Discharge: Continued Medical Work up   Patient Goals and CMS Choice      Discharge Placement                         Discharge Plan and Services Additional resources added to the After Visit Summary for                                       Social Determinants of Health (SDOH) Interventions SDOH Screenings   Food Insecurity: No Food Insecurity (11/23/2022)  Housing: Low Risk  (11/23/2022)  Transportation Needs: No Transportation Needs (11/23/2022)  Utilities: Not At Risk (11/23/2022)  Depression (PHQ2-9): Low Risk  (12/12/2021)  Tobacco Use: Medium Risk (11/23/2022)     Readmission Risk Interventions     No data to display

## 2022-11-30 LAB — CULTURE, BLOOD (ROUTINE X 2)
Culture: NO GROWTH
Culture: NO GROWTH
Special Requests: ADEQUATE
Special Requests: ADEQUATE

## 2022-12-04 ENCOUNTER — Ambulatory Visit: Admission: RE | Admit: 2022-12-04 | Payer: Medicare Other | Source: Home / Self Care | Admitting: Ophthalmology

## 2022-12-04 SURGERY — CATARACT EXTRACTION PHACO AND INTRAOCULAR LENS PLACEMENT (IOC)
Anesthesia: Topical | Laterality: Right

## 2022-12-11 ENCOUNTER — Ambulatory Visit: Payer: Medicare Other | Admitting: Physician Assistant

## 2022-12-12 ENCOUNTER — Telehealth: Payer: Self-pay | Admitting: Gastroenterology

## 2022-12-12 NOTE — Telephone Encounter (Signed)
Pt daughter carressa requesting call back to discuss her fathers hiccups

## 2022-12-13 ENCOUNTER — Inpatient Hospital Stay: Payer: Medicare Other

## 2022-12-13 ENCOUNTER — Observation Stay
Admission: EM | Admit: 2022-12-13 | Discharge: 2022-12-14 | Disposition: A | Discharge status: Home / Self Care | Payer: Medicare Other | Attending: Internal Medicine | Admitting: Internal Medicine

## 2022-12-13 ENCOUNTER — Emergency Department: Payer: Medicare Other

## 2022-12-13 ENCOUNTER — Encounter: Payer: Self-pay | Admitting: Internal Medicine

## 2022-12-13 DIAGNOSIS — E559 Vitamin D deficiency, unspecified: Secondary | ICD-10-CM | POA: Diagnosis present

## 2022-12-13 DIAGNOSIS — R221 Localized swelling, mass and lump, neck: Secondary | ICD-10-CM | POA: Diagnosis not present

## 2022-12-13 DIAGNOSIS — R222 Localized swelling, mass and lump, trunk: Secondary | ICD-10-CM | POA: Diagnosis present

## 2022-12-13 DIAGNOSIS — E871 Hypo-osmolality and hyponatremia: Secondary | ICD-10-CM | POA: Diagnosis not present

## 2022-12-13 DIAGNOSIS — Z1152 Encounter for screening for COVID-19: Secondary | ICD-10-CM | POA: Insufficient documentation

## 2022-12-13 DIAGNOSIS — R531 Weakness: Secondary | ICD-10-CM | POA: Diagnosis present

## 2022-12-13 DIAGNOSIS — N179 Acute kidney failure, unspecified: Secondary | ICD-10-CM | POA: Diagnosis not present

## 2022-12-13 DIAGNOSIS — K224 Dyskinesia of esophagus: Secondary | ICD-10-CM | POA: Diagnosis not present

## 2022-12-13 DIAGNOSIS — Z87891 Personal history of nicotine dependence: Secondary | ICD-10-CM | POA: Diagnosis not present

## 2022-12-13 DIAGNOSIS — E7841 Elevated Lipoprotein(a): Secondary | ICD-10-CM | POA: Insufficient documentation

## 2022-12-13 DIAGNOSIS — R17 Unspecified jaundice: Secondary | ICD-10-CM | POA: Insufficient documentation

## 2022-12-13 DIAGNOSIS — I10 Essential (primary) hypertension: Secondary | ICD-10-CM | POA: Diagnosis not present

## 2022-12-13 DIAGNOSIS — R066 Hiccough: Secondary | ICD-10-CM

## 2022-12-13 DIAGNOSIS — F1011 Alcohol abuse, in remission: Secondary | ICD-10-CM | POA: Diagnosis present

## 2022-12-13 LAB — CBC WITH DIFFERENTIAL/PLATELET
Abs Immature Granulocytes: 0.02 10*3/uL (ref 0.00–0.07)
Basophils Absolute: 0 10*3/uL (ref 0.0–0.1)
Basophils Relative: 1 %
Eosinophils Absolute: 0.1 10*3/uL (ref 0.0–0.5)
Eosinophils Relative: 2 %
HCT: 44.6 % (ref 39.0–52.0)
Hemoglobin: 14.9 g/dL (ref 13.0–17.0)
Immature Granulocytes: 0 %
Lymphocytes Relative: 14 %
Lymphs Abs: 0.8 10*3/uL (ref 0.7–4.0)
MCH: 31.1 pg (ref 26.0–34.0)
MCHC: 33.4 g/dL (ref 30.0–36.0)
MCV: 93.1 fL (ref 80.0–100.0)
Monocytes Absolute: 0.4 10*3/uL (ref 0.1–1.0)
Monocytes Relative: 7 %
Neutro Abs: 4.4 10*3/uL (ref 1.7–7.7)
Neutrophils Relative %: 76 %
Platelets: 201 10*3/uL (ref 150–400)
RBC: 4.79 MIL/uL (ref 4.22–5.81)
RDW: 12.2 % (ref 11.5–15.5)
WBC: 5.7 10*3/uL (ref 4.0–10.5)
nRBC: 0 % (ref 0.0–0.2)

## 2022-12-13 LAB — COMPREHENSIVE METABOLIC PANEL
ALT: 18 U/L (ref 0–44)
AST: 18 U/L (ref 15–41)
Albumin: 3.9 g/dL (ref 3.5–5.0)
Alkaline Phosphatase: 63 U/L (ref 38–126)
Anion gap: 8 (ref 5–15)
BUN: 23 mg/dL (ref 8–23)
CO2: 25 mmol/L (ref 22–32)
Calcium: 9.1 mg/dL (ref 8.9–10.3)
Chloride: 99 mmol/L (ref 98–111)
Creatinine, Ser: 1.29 mg/dL — ABNORMAL HIGH (ref 0.61–1.24)
GFR, Estimated: 59 mL/min — ABNORMAL LOW (ref >60)
Glucose, Bld: 96 mg/dL (ref 70–99)
Potassium: 5 mmol/L (ref 3.5–5.1)
Sodium: 132 mmol/L — ABNORMAL LOW (ref 135–145)
Total Bilirubin: 1.5 mg/dL — ABNORMAL HIGH (ref 0.3–1.2)
Total Protein: 6.4 g/dL — ABNORMAL LOW (ref 6.5–8.1)

## 2022-12-13 LAB — URINALYSIS, W/ REFLEX TO CULTURE (INFECTION SUSPECTED)
Bacteria, UA: NONE SEEN
Bilirubin Urine: NEGATIVE
Glucose, UA: NEGATIVE mg/dL
Hgb urine dipstick: NEGATIVE
Ketones, ur: 20 mg/dL — AB
Leukocytes,Ua: NEGATIVE
Nitrite: NEGATIVE
Protein, ur: 100 mg/dL — AB
Specific Gravity, Urine: 1.019 (ref 1.005–1.030)
pH: 5 (ref 5.0–8.0)

## 2022-12-13 LAB — PROTIME-INR
INR: 1.1 (ref 0.8–1.2)
Prothrombin Time: 14 s (ref 11.4–15.2)

## 2022-12-13 LAB — LACTIC ACID, PLASMA
Lactic Acid, Venous: 1.5 mmol/L (ref 0.5–1.9)
Lactic Acid, Venous: 1.3 mmol/L (ref 0.5–1.9)

## 2022-12-13 LAB — SARS CORONAVIRUS 2 BY RT PCR: SARS Coronavirus 2 by RT PCR: NEGATIVE

## 2022-12-13 LAB — PROCALCITONIN: Procalcitonin: <0.1 ng/mL

## 2022-12-13 MED ORDER — OXYMETAZOLINE HCL 0.05 % NA SOLN
1.0000 | Freq: Two times a day (BID) | NASAL | Status: DC | PRN
  Administered 2022-12-13: 2 via NASAL
  Filled 2022-12-13: qty 15

## 2022-12-13 MED ORDER — ONDANSETRON HCL 4 MG/2ML IJ SOLN: 4.0000 mg | Freq: Four times a day (QID) | INTRAMUSCULAR | Status: DC | PRN

## 2022-12-13 MED ORDER — FLUTICASONE PROPIONATE 50 MCG/ACT NA SUSP: 1.0000 | Freq: Every day | NASAL | Status: DC | PRN

## 2022-12-13 MED ORDER — ONDANSETRON HCL 4 MG PO TABS: 4.0000 mg | ORAL_TABLET | Freq: Four times a day (QID) | ORAL | Status: DC | PRN

## 2022-12-13 MED ORDER — HYDRALAZINE HCL 20 MG/ML IJ SOLN: 5.0000 mg | Freq: Four times a day (QID) | INTRAMUSCULAR | Status: DC | PRN

## 2022-12-13 MED ORDER — METOPROLOL SUCCINATE ER 25 MG PO TB24
25.0000 mg | ORAL_TABLET | Freq: Every day | ORAL | Status: DC
  Administered 2022-12-14: 25 mg via ORAL
  Filled 2022-12-13: qty 1

## 2022-12-13 MED ORDER — METOCLOPRAMIDE HCL 10 MG PO TABS
10.0000 mg | ORAL_TABLET | Freq: Four times a day (QID) | ORAL | Status: DC
  Administered 2022-12-13 – 2022-12-14 (×3): 10 mg via ORAL
  Filled 2022-12-13 (×6): qty 1

## 2022-12-13 MED ORDER — HEPARIN SODIUM (PORCINE) 5000 UNIT/ML IJ SOLN
5000.0000 [IU] | Freq: Three times a day (TID) | INTRAMUSCULAR | Status: DC
  Administered 2022-12-13 – 2022-12-14 (×2): 5000 [IU] via SUBCUTANEOUS
  Filled 2022-12-13 (×2): qty 1

## 2022-12-13 MED ORDER — SODIUM CHLORIDE 0.9 % IV SOLN: INTRAVENOUS | Status: DC

## 2022-12-13 MED ORDER — SALINE SPRAY 0.65 % NA SOLN: 1.0000 | NASAL | Status: DC | PRN

## 2022-12-13 MED ORDER — SENNOSIDES-DOCUSATE SODIUM 8.6-50 MG PO TABS: 1.0000 | ORAL_TABLET | Freq: Every evening | ORAL | Status: DC | PRN

## 2022-12-13 MED ORDER — METOCLOPRAMIDE HCL 5 MG/ML IJ SOLN: 5.0000 mg | INTRAMUSCULAR | Status: DC | PRN

## 2022-12-13 MED ORDER — CHLORPROMAZINE HCL 10 MG PO TABS
10.0000 mg | ORAL_TABLET | Freq: Two times a day (BID) | ORAL | Status: DC
  Administered 2022-12-13: 10 mg via ORAL
  Filled 2022-12-13 (×3): qty 1

## 2022-12-13 MED ORDER — METOPROLOL SUCCINATE ER 25 MG PO TB24: 25.0000 mg | ORAL_TABLET | Freq: Every day | ORAL | Status: DC

## 2022-12-13 MED ORDER — ACETAMINOPHEN 325 MG PO TABS: 650.0000 mg | ORAL_TABLET | Freq: Four times a day (QID) | ORAL | Status: DC | PRN

## 2022-12-13 MED ORDER — SODIUM CHLORIDE 0.9 % IV BOLUS
1000.0000 mL | Freq: Once | INTRAVENOUS | Status: AC
  Administered 2022-12-13: 1000 mL via INTRAVENOUS

## 2022-12-13 MED ORDER — METOPROLOL TARTRATE 5 MG/5ML IV SOLN
5.0000 mg | Freq: Once | INTRAVENOUS | Status: AC
  Administered 2022-12-13: 5 mg via INTRAVENOUS
  Filled 2022-12-13: qty 5

## 2022-12-13 MED ORDER — ACETAMINOPHEN 650 MG RE SUPP: 650.0000 mg | Freq: Four times a day (QID) | RECTAL | Status: DC | PRN

## 2022-12-13 MED ORDER — GABAPENTIN 100 MG PO CAPS
100.0000 mg | ORAL_CAPSULE | Freq: Three times a day (TID) | ORAL | Status: DC
  Administered 2022-12-13 – 2022-12-14 (×2): 100 mg via ORAL
  Filled 2022-12-13 (×2): qty 1

## 2022-12-13 NOTE — H&P (Addendum)
History and Physical   ARYAVEER LOAR FTD:[id number] DOB: 08-18-1950 DOA: 12/13/2022  PCP: Marisue Ivan, MD  Patient coming from: Home via EMS  I have personally briefly reviewed patient's old medical records in [facility] EMR.  Chief Concern: Weakness  HPI: John Booker is a 72 year old male with history of sepsis with bacteremia including MSSA bacteremia in September 2023, Klebsiella oxytoca bacteremia in September 2024, hypertension, chronic hiccups follows with GI, who presents to the emergency department for chief concerns of generalized weakness.  Vitals in the ED showed T of 97.6, respiration rate of 16, heart rate of 140, improved to 85, blood pressure 126/94, SpO2 100% room air.  Serum sodium is 132, potassium 5.0, chloride 99, bicarb 25, BUN of 23, serum creatinine 1.29, EGFR 59, nonfasting blood glucose 96, WBC 5.7, hemoglobin 14.9, platelets of 201.  Lactic acid is 1.5, procalcitonin within normal limits, UA was negative for nitrates and leukocytes.  T. bili with elevated at 1.5.  ED treatment: Sodium chloride 1 L bolus. ------------------------------- At bedside, he was able to tell me his name, age, location, current calendar year.  He endorses generalized weakness that started since yesterday.  He denies trauma to his person.  He denies chest pain, shortness of breath, fever, dysuria, hematuria, diarrhea.   He states he is weak all the time.  He endorses his nose feeling stuffed up as he has broken his nose many times in the past.  He endorses chronic hiccups that has been ongoing for 2 to 3 years.  Social history: He lives by himself. He denies tobacco, etoh, and recreational drug. He is retired and formerly worked for [employer].   ROS: Constitutional: no weight change, no fever ENT/Mouth: no sore throat, no rhinorrhea Eyes: no eye pain, no vision changes Cardiovascular: no chest pain, no dyspnea,  no edema, no palpitations Respiratory: no cough, no  sputum, no wheezing Gastrointestinal: no nausea, no vomiting, no diarrhea, no constipation Genitourinary: no urinary incontinence, no dysuria, no hematuria Musculoskeletal: no arthralgias, no myalgias Skin: no skin lesions, no pruritus, Neuro: + weakness, no loss of consciousness, no syncope Psych: no anxiety, no depression, no decrease appetite Heme/Lymph: no bruising, no bleeding  ED Course: Discussed with emergency medicine provider, patient requiring hospitalization for chief concerns of weakness.  Assessment/Plan  Principal Problem:   Weakness Active Problems:   AKI (acute kidney injury) (HCC)   Hyponatremia   Essential hypertension   Vitamin D deficiency   Esophageal dysmotility   History of alcohol abuse   History of tobacco abuse   Benign paraspinal mass   Serum total bilirubin elevated   Assessment and Plan:  * Weakness Workup in progress, check B12 level Given labs that have resulted thus far, differentials include acute kidney injury/dehydration Blood cultures x 2 are in process as ordered by EDP We will trend second lactic acid level UA was negative for leukocytes and nitrates, procalcitonin was not elevated PT, OT consulted Team to consult TOC pending PT and OT recommendation Fall, aspiration precaution Admit to telemetry medical, inpatient  Hyponatremia Appears to be at baseline for patient  AKI (acute kidney injury) (HCC) With no prior CKD at baseline Status post sodium chloride 1 L bolus per EDP Sodium chloride infusion at 100 mL/h, 1 day ordered Encourage p.o. intake BMP in the a.m.  Essential hypertension Hydralazine 5 mg IV every 6 hours as needed for SBP greater 160, 5 days ordered  Serum total bilirubin elevated Right upper quadrant abdominal ultrasound ordered  Benign paraspinal mass With nasal irritation and congestion Afrin spray  Esophageal dysmotility As needed metoclopramide 5 mg IV every 4 hours as needed for nausea, vomiting, 3  days ordered  Chart reviewed.   DVT prophylaxis: Heparin 5000 units subcutaneous every 8 hours Code Status: Full code Diet: Heart healthy Family Communication: No Disposition Plan: Pending clinical course Consults called: PT, OT Admission status: Telemetry medical, inpatient  Past Medical History:  Diagnosis Date   Chronic hiccups    intermittent   Hypertension    Memory changes    Past Surgical History:  Procedure Laterality Date   CATARACT EXTRACTION W/PHACO Left 11/13/2022   Procedure: CATARACT EXTRACTION PHACO AND INTRAOCULAR LENS PLACEMENT (IOC) LEFT OMIDRIA   13.58  01:23.3;  Surgeon: Estanislado Pandy, MD;  Location: [facility];  Service: Ophthalmology;  Laterality: Left;   ESOPHAGOGASTRODUODENOSCOPY (EGD) WITH PROPOFOL N/A 05/26/2018   Procedure: ESOPHAGOGASTRODUODENOSCOPY (EGD) WITH PROPOFOL;  Surgeon: Pasty Spillers, MD;  Location: [facility] END[facility];  Service: Endoscopy;  Laterality: N/A;   ESOPHAGOGASTRODUODENOSCOPY (EGD) WITH PROPOFOL N/A 12/28/2020   Procedure: ESOPHAGOGASTRODUODENOSCOPY (EGD) WITH PROPOFOL;  Surgeon: Jaynie Collins, DO;  Location: [facility] END[facility]COPY;  Service: Gastroenterology;  Laterality: N/A;   ESOPHAGOGASTRODUODENOSCOPY (EGD) WITH PROPOFOL N/A 10/09/2021   Procedure: ESOPHAGOGASTRODUODENOSCOPY (EGD) WITH PROPOFOL;  Surgeon: Wyline Mood, MD;  Location: [facility];  Service: Gastroenterology;  Laterality: N/A;   INCISION AND DRAINAGE PERIRECTAL ABSCESS N/A 11/19/2015   Procedure: IRRIGATION AND DEBRIDEMENT PERIRECTAL ABSCESS and debridement of scrotal abscess;  Surgeon: Lattie Haw, MD;  Location: [facility] ORS;  Service: General;  Laterality: N/A;   INCISION AND DRAINAGE PERIRECTAL ABSCESS N/A 11/20/2015   Procedure: IRRIGATION AND DEBRIDEMENT PERIRECTAL ABSCESS / WITH DRESSING CHANGE;  Surgeon: Lattie Haw, MD;  Location: [facility] ORS;  Service: General;  Laterality: N/A;  peri rectal site   INCISION AND DRAINAGE PERIRECTAL ABSCESS  N/A 11/21/2015   Procedure: IRRIGATION AND DEBRIDEMENT PERIRECTAL ABSCESS WITH DRESSING CHANGE;  Surgeon: Lattie Haw, MD;  Location: [facility] ORS;  Service: General;  Laterality: N/A;   MINOR HEMORRHOIDECTOMY  1989   TEE WITHOUT CARDIOVERSION N/A 11/20/2021   Procedure: TRANSESOPHAGEAL ECHOCARDIOGRAM (TEE);  Surgeon: Lamar Blinks, MD;  Location: [facility] ORS;  Service: Cardiovascular;  Laterality: N/A;  8:00am   Social History:  reports that he quit smoking about 8 months ago. His smoking use included cigarettes. He started smoking about 38 years ago. He has a 19 pack-year smoking history. He has quit using smokeless tobacco. He reports that he does not currently use alcohol. He reports that he does not use drugs.  Allergies  Allergen Reactions   Adalat [Nifedipine]     Foot swelling.    Amlodipine Other (See Comments)    Leg swelling   Family History  Problem Relation Age of Onset   Hypertension Father    Family history: Family history reviewed and not pertinent.  Prior to Admission medications   Medication Sig Start Date End Date Taking? Authorizing Provider  chlorproMAZINE (THORAZINE) 25 MG tablet Take 1 tablet (25 mg total) by mouth daily as needed for hiccoughs. 11/26/22   Darlin Priestly, MD  furosemide (LASIX) 40 MG tablet Take 1 tablet (40 mg total) by mouth daily. 11/26/22   Darlin Priestly, MD  gabapentin (NEURONTIN) 100 MG capsule TAKE 1 CAPSULE BY MOUTH 3 TIMES DAILY 08/25/22   Wyline Mood, MD  losartan (COZAAR) 100 MG tablet Take 1 tablet (100 mg total) by mouth daily. 05/13/21   Karamalegos, Netta Neat,  DO  metoprolol succinate (TOPROL-XL) 25 MG 24 hr tablet Take 1 tablet (25 mg total) by mouth daily. 11/27/22   Darlin Priestly, MD  ondansetron (ZOFRAN) 4 MG tablet Take 1 tablet (4 mg total) by mouth every 8 (eight) hours as needed for nausea or vomiting. 10/01/22   Wyline Mood, MD  oxymetazoline (NASAL RELIEF) 0.05 % nasal spray Place 1 spray into both nostrils 2 (two) times daily as needed.     [provider]  pantoprazole (PROTONIX) 40 MG tablet TAKE ONE (1) TABLET BY MOUTH TWO TIMES PER DAY 05/23/22   Wyline Mood, MD  potassium chloride SA (KLOR-CON M) 20 MEQ tablet Take 1 tablet (20 mEq total) by mouth daily. 02/19/22   Arnetha Courser, MD  sodium chloride (OCEAN) 0.65 % SOLN nasal spray Place 1 spray into both nostrils as needed for congestion (nose irritation). 02/19/22   Arnetha Courser, MD   Physical Exam: Vitals:   12/13/22 1400 12/13/22 1500 12/13/22 1910 12/13/22 1913  BP: (!) 129/97 (!) 151/94    Pulse:  79    Resp:      Temp:   97.9 F (36.6 C)   TempSrc:   Oral   SpO2:    100%   Constitutional: appears frail, chronically ill, irritated Eyes: PERRL, lids and conjunctivae normal ENMT: Mucous membranes are moist. Posterior pharynx clear of any exudate or lesions. Age-appropriate dentition. Hearing appropriate Neck: normal, supple, no masses, no thyromegaly Respiratory: clear to auscultation bilaterally, no wheezing, no crackles. Normal respiratory effort. No accessory muscle use.  Cardiovascular: Regular rate and rhythm, no murmurs / rubs / gallops. No extremity edema. 2+ pedal pulses. No carotid bruits.  Abdomen: Obese abdomen, no tenderness, no masses palpated, no hepatosplenomegaly. Bowel sounds positive.  Musculoskeletal: no clubbing / cyanosis. No joint deformity upper and lower extremities. Good ROM, no contractures, no atrophy. Normal muscle tone.  Skin: no rashes, lesions, ulcers. No induration Neurologic: Sensation intact. Strength 5/5 in all 4.  Psychiatric: Normal judgment and insight. Alert and oriented x 3.  Agitated mood.   EKG: independently reviewed, showing supraventricular tachycardia/sinus tachycardia with rate of 139, QTc 477  Chest x-ray on Admission: I personally reviewed and I agree with radiologist reading as below.  DG Chest 2 View  Result Date: 12/13/2022 CLINICAL DATA:  Sepsis. EXAM: CHEST - 2 VIEW COMPARISON:  X-ray 11/22/2022 and  CT scan FINDINGS: Hyperinflation. No consolidation, pneumothorax or effusion. No edema. Normal cardiopericardial silhouette. Calcified aorta. Diffuse syndesmophytes along the spine IMPRESSION: Hyperinflation.  No acute cardiopulmonary disease Electronically Signed   By: Karen Kays M.D.   On: 12/13/2022 13:43    Labs on Admission: I have personally reviewed following labs  CBC: Recent Labs  Lab 12/13/22 1514  WBC 5.7  NEUTROABS 4.4  HGB 14.9  HCT 44.6  MCV 93.1  PLT 201   Basic Metabolic Panel: Recent Labs  Lab 12/13/22 1514  NA 132*  K 5.0  CL 99  CO2 25  GLUCOSE 96  BUN 23  CREATININE 1.29*  CALCIUM 9.1   GFR: CrCl cannot be calculated (Unknown ideal weight.).  Liver Function Tests: Recent Labs  Lab 12/13/22 1514  AST 18  ALT 18  ALKPHOS 63  BILITOT 1.5*  PROT 6.4*  ALBUMIN 3.9   Coagulation Profile: Recent Labs  Lab 12/13/22 1514  INR 1.1   Urine analysis:    Component Value Date/Time   COLORURINE YELLOW (A) 12/13/2022 1301   APPEARANCEUR HAZY (A) 12/13/2022 1301   APPEARANCEUR  Clear 10/29/2021 1034   LABSPEC 1.019 12/13/2022 1301   PHURINE 5.0 12/13/2022 1301   GLUCOSEU NEGATIVE 12/13/2022 1301   HGBUR NEGATIVE 12/13/2022 1301   BILIRUBINUR NEGATIVE 12/13/2022 1301   BILIRUBINUR Negative 10/29/2021 1034   KETONESUR 20 (A) 12/13/2022 1301   PROTEINUR 100 (A) 12/13/2022 1301   NITRITE NEGATIVE 12/13/2022 1301   LEUKOCYTESUR NEGATIVE 12/13/2022 1301   This document was prepared using Dragon Voice Recognition software and may include unintentional dictation errors.  Dr. Sedalia Muta [facility]  If 7PM-7AM, please contact overnight-coverage provider If 7AM-7PM, please contact day attending provider [email or web address]  12/13/2022, 7:41 PM

## 2022-12-13 NOTE — Hospital Course (Addendum)
Mr. John Booker is a 72 year old male with history of sepsis with bacteremia including MSSA bacteremia in September 2023, Klebsiella oxytoca bacteremia in September 2024, hypertension, chronic hiccups follows with GI, who presents to the emergency department for chief concerns of generalized weakness.  Vitals in the ED showed T of 97.6, respiration rate of 16, heart rate of 140, improved to 85, blood pressure 126/94, SpO2 100% room air.  Serum sodium is 132, potassium 5.0, chloride 99, bicarb 25, BUN of 23, serum creatinine 1.29, EGFR 59, nonfasting blood glucose 96, WBC 5.7, hemoglobin 14.9, platelets of 201.  Lactic acid is 1.5, procalcitonin within normal limits, UA was negative for nitrates and leukocytes.  T. bili with elevated at 1.5.  ED treatment: Sodium chloride 1 L bolus.

## 2022-12-13 NOTE — Assessment & Plan Note (Signed)
Hydralazine 5 mg IV every 6 hours as needed for SBP greater 160, 5 days ordered

## 2022-12-13 NOTE — Assessment & Plan Note (Addendum)
Workup in progress, check B12 level Given labs that have resulted thus far, differentials include acute kidney injury/dehydration Blood cultures x 2 are in process as ordered by EDP We will trend second lactic acid level UA was negative for leukocytes and nitrates, procalcitonin was not elevated PT, OT consulted Team to consult TOC pending PT and OT recommendation Fall, aspiration precaution Admit to telemetry medical, inpatient

## 2022-12-13 NOTE — ED Notes (Signed)
Dr. Erma Heritage aware pt HR.

## 2022-12-13 NOTE — Assessment & Plan Note (Addendum)
With no prior CKD at baseline Status post sodium chloride 1 L bolus per EDP Sodium chloride infusion at 100 mL/h, 1 day ordered Encourage p.o. intake BMP in the a.m.

## 2022-12-13 NOTE — Assessment & Plan Note (Signed)
Appears to be at baseline for patient

## 2022-12-13 NOTE — Assessment & Plan Note (Signed)
With nasal irritation and congestion Afrin spray

## 2022-12-13 NOTE — ED Provider Notes (Signed)
[facility] Provider Note    Event Date/Time   First MD Initiated Contact with Patient 12/13/22 1246     (approximate)   History   Weakness   HPI  John Booker is a 72 y.o. male  with PMHX chronic hiccups, HTN, recent sepsis of unknown source, here with generalized weakness. Pt reportedly has not felt great since recent d/c, but has more acutely worsened over the past 24 hours. He has been weak, not able to eat/drink, and has been coughing more than usual. He has chronic, severe hiccups and this limits his ability to eat/drink much. He reports feeling weak, tired, and frustrated with recurrent illness. Denies any rash. No pain.       Physical Exam   Triage Vital Signs: ED Triage Vitals  Encounter Vitals Group     BP 12/13/22 1234 112/79     Systolic BP Percentile --      Diastolic BP Percentile --      Pulse Rate 12/13/22 1234 (!) 140     Resp 12/13/22 1234 16     Temp 12/13/22 1234 97.6 F (36.4 C)     Temp Source 12/13/22 1234 Oral     SpO2 12/13/22 1234 100 %     Weight --      Height --      Head Circumference --      Peak Flow --      Pain Score 12/13/22 1235 0     Pain Loc --      Pain Education --      Exclude from Growth Chart --     Most recent vital signs: Vitals:   12/13/22 1910 12/13/22 1913  BP:    Pulse:    Resp:    Temp: 97.9 F (36.6 C)   SpO2:  100%     General: Awake, no distress.  CV:  Good peripheral perfusion. Tachycardic, but regular. Resp:  Normal work of breathing. Slight rales noted bibasilar. Abd:  No distention. No tenderness. No guarding or rebound. Other:  Dry ulcers/wounds bl feet with no active erythema, drainage.   ED Results / Procedures / Treatments   Labs (all labs ordered are listed, but only abnormal results are displayed) Labs Reviewed  COMPREHENSIVE METABOLIC PANEL - Abnormal; Notable for the following components:      Result Value   Sodium 132 (*)    Creatinine, Ser 1.29 (*)     Total Protein 6.4 (*)    Total Bilirubin 1.5 (*)    GFR, Estimated 59 (*)    All other components within normal limits  URINALYSIS, W/ REFLEX TO CULTURE (INFECTION SUSPECTED) - Abnormal; Notable for the following components:   Color, Urine YELLOW (*)    APPearance HAZY (*)    Ketones, ur 20 (*)    Protein, ur 100 (*)    All other components within normal limits  SARS CORONAVIRUS 2 BY RT PCR  CULTURE, BLOOD (ROUTINE X 2)  CULTURE, BLOOD (ROUTINE X 2) W REFLEX TO ID PANEL  LACTIC ACID, PLASMA  CBC WITH DIFFERENTIAL/PLATELET  PROTIME-INR  PROCALCITONIN  BASIC METABOLIC PANEL  CBC  VITAMIN B12  LACTIC ACID, PLASMA     EKG Sinus tachycardia vs SVT< VR 139. QRS 92, QTc 477. Non specific ST changes likely rate related   RADIOLOGY CXR: Clear   I also independently reviewed and agree with radiologist interpretations.   PROCEDURES:  Critical Care performed: No  .1-3 Lead EKG Interpretation  Performed by: Shaune Pollack, MD Authorized by: Shaune Pollack, MD     Interpretation: non-specific     ECG rate:  80-140   ECG rate assessment: tachycardic     Rhythm: sinus tachycardia     Ectopy: none     Conduction: normal   Comments:     Indication: Weakness     MEDICATIONS ORDERED IN ED: Medications  acetaminophen (TYLENOL) tablet 650 mg (has no administration in time range)    Or  acetaminophen (TYLENOL) suppository 650 mg (has no administration in time range)  heparin injection 5,000 Units (has no administration in time range)  senna-docusate (Senokot-S) tablet 1 tablet (has no administration in time range)  0.9 %  sodium chloride infusion ( Intravenous New Bag/Given 12/13/22 1911)  hydrALAZINE (APRESOLINE) injection 5 mg (has no administration in time range)  oxymetazoline (AFRIN) 0.05 % nasal spray 1-2 spray (has no administration in time range)  ondansetron (ZOFRAN) injection 4 mg (has no administration in time range)  metoprolol succinate (TOPROL-XL) 24 hr  tablet 25 mg (has no administration in time range)  chlorproMAZINE (THORAZINE) tablet 10 mg (has no administration in time range)  metoCLOPramide (REGLAN) tablet 10 mg (has no administration in time range)  gabapentin (NEURONTIN) capsule 100 mg (has no administration in time range)  sodium chloride (OCEAN) 0.65 % nasal spray 1 spray (has no administration in time range)  sodium chloride 0.9 % bolus 1,000 mL (0 mLs Intravenous Stopped 12/13/22 1400)     IMPRESSION / MDM / ASSESSMENT AND PLAN / ED COURSE  I reviewed the triage vital signs and the nursing notes.                              Differential diagnosis includes, but is not limited to, recurrent sepsis, dehydration, anemia, AKI, COVID-19,  Patient's presentation is most consistent with acute presentation with potential threat to life or bodily function.   The patient is on the cardiac monitor to evaluate for evidence of arrhythmia and/or significant heart rate changes  72 yo M here with recurrent generalized weakness, difficulty ambulating. Pt tachycardic, dehydrated on exam. Suspect recurrent dehydration complicated by his chronic hiccups/difficulty eating/drinking, for which he has been worked up extensively. He does have h/o recent sepsis, however, so will check broad labs, cultures, and reassess. Pt has had difficulty ambulating at home and feels generally weak.    FINAL CLINICAL IMPRESSION(S) / ED DIAGNOSES   Final diagnoses:  Generalized weakness     Rx / DC Orders   ED Discharge Orders     None        Note:  This document was prepared using Dragon voice recognition software and may include unintentional dictation errors.   Shaune Pollack, MD 12/13/22 [phone]

## 2022-12-13 NOTE — Assessment & Plan Note (Signed)
As needed metoclopramide 5 mg IV every 4 hours as needed for nausea, vomiting, 3 days ordered

## 2022-12-13 NOTE — Assessment & Plan Note (Signed)
Right upper quadrant abdominal ultrasound ordered

## 2022-12-13 NOTE — ED Triage Notes (Signed)
Pt to ED via EMS with generalized weakness since last night LKW 2000. Pt is A& O x4, Pt denies recent fevers/chills. Pt is tachycardic at 140.

## 2022-12-13 NOTE — ED Notes (Signed)
First nurse note: Form home [facility], generalized weakness, hard to ambulate, worse since last night, uses walker at baseline, can stand and pivot with EMS  HR 140, ST 133/86 98% RA Temp 97.1 axillary  Discharged 1 month ago for sepsis Takes metoprolol

## 2022-12-14 DIAGNOSIS — R531 Weakness: Secondary | ICD-10-CM | POA: Diagnosis not present

## 2022-12-14 LAB — BASIC METABOLIC PANEL
Anion gap: 9 (ref 5–15)
BUN: 21 mg/dL (ref 8–23)
CO2: 23 mmol/L (ref 22–32)
Calcium: 8.6 mg/dL — ABNORMAL LOW (ref 8.9–10.3)
Chloride: 100 mmol/L (ref 98–111)
Creatinine, Ser: 0.96 mg/dL (ref 0.61–1.24)
GFR, Estimated: >60 mL/min (ref >60)
Glucose, Bld: 80 mg/dL (ref 70–99)
Potassium: 4.4 mmol/L (ref 3.5–5.1)
Sodium: 132 mmol/L — ABNORMAL LOW (ref 135–145)

## 2022-12-14 LAB — CBC
HCT: 41.9 % (ref 39.0–52.0)
Hemoglobin: 14 g/dL (ref 13.0–17.0)
MCH: 30.9 pg (ref 26.0–34.0)
MCHC: 33.4 g/dL (ref 30.0–36.0)
MCV: 92.5 fL (ref 80.0–100.0)
Platelets: 201 10*3/uL (ref 150–400)
RBC: 4.53 MIL/uL (ref 4.22–5.81)
RDW: 12.3 % (ref 11.5–15.5)
WBC: 4.4 10*3/uL (ref 4.0–10.5)
nRBC: 0 % (ref 0.0–0.2)

## 2022-12-14 LAB — VITAMIN B12: Vitamin B-12: 205 pg/mL (ref 180–914)

## 2022-12-14 MED ORDER — CHLORPROMAZINE HCL 25 MG PO TABS
25.0000 mg | ORAL_TABLET | Freq: Three times a day (TID) | ORAL | Status: DC
  Administered 2022-12-14: 25 mg via ORAL
  Filled 2022-12-14 (×2): qty 1

## 2022-12-14 MED ORDER — CHLORPROMAZINE HCL 25 MG PO TABS: 25.0000 mg | ORAL_TABLET | Freq: Three times a day (TID) | ORAL | 0 refills | Status: DC

## 2022-12-14 NOTE — Progress Notes (Signed)
Mobility Specialist - Progress Note   12/14/22 1235  Mobility  Activity Ambulated with assistance in room  Level of Assistance Standby assist, set-up cues, supervision of patient - no hands on  Assistive Device None  Distance Ambulated (ft) 10 ft  Activity Response Tolerated well  Mobility Referral Yes  $Mobility charge 1 Mobility  Mobility Specialist Start Time (ACUTE ONLY) 1011  Mobility Specialist Stop Time (ACUTE ONLY) 1021  Mobility Specialist Time Calculation (min) (ACUTE ONLY) 10 min   Pt OOB on RA attempting to reorganize room upon arrival. Pt ambulates in room SBA from recliner to EOB with no LOB noted. Pt left EOB with needs in reach.   Terrilyn Saver  Mobility Specialist  12/14/22 12:37 PM

## 2022-12-14 NOTE — Plan of Care (Signed)
  Problem: Health Behavior/Discharge Planning: Goal: Ability to manage health-related needs will improve Outcome: Progressing   Problem: Clinical Measurements: Goal: Will remain free from infection Outcome: Progressing Goal: Diagnostic test results will improve Outcome: Progressing Goal: Respiratory complications will improve Outcome: Progressing Goal: Cardiovascular complication will be avoided Outcome: Progressing   Problem: Activity: Goal: Risk for activity intolerance will decrease 12/14/2022 0357 by Dorthula Nettles, RN Outcome: Progressing 12/14/2022 0355 by Dorthula Nettles, RN Outcome: Progressing   Problem: Nutrition: Goal: Adequate nutrition will be maintained Outcome: Progressing   Problem: Coping: Goal: Level of anxiety will decrease 12/14/2022 0357 by Dorthula Nettles, RN Outcome: Progressing 12/14/2022 0355 by Dorthula Nettles, RN Outcome: Progressing   Problem: Elimination: Goal: Will not experience complications related to bowel motility 12/14/2022 0357 by Dorthula Nettles, RN Outcome: Progressing 12/14/2022 0355 by Dorthula Nettles, RN Outcome: Progressing Goal: Will not experience complications related to urinary retention Outcome: Progressing   Problem: Pain Managment: Goal: General experience of comfort will improve Outcome: Progressing   Problem: Safety: Goal: Ability to remain free from injury will improve Outcome: Progressing   Problem: Skin Integrity: Goal: Risk for impaired skin integrity will decrease Outcome: Progressing

## 2022-12-14 NOTE — Plan of Care (Signed)
  Problem: Fluid Volume: Goal: Hemodynamic stability will improve Outcome: Adequate for Discharge   Problem: Clinical Measurements: Goal: Diagnostic test results will improve Outcome: Adequate for Discharge Goal: Signs and symptoms of infection will decrease Outcome: Adequate for Discharge   Problem: Respiratory: Goal: Ability to maintain adequate ventilation will improve Outcome: Adequate for Discharge   Problem: Education: Goal: Knowledge of General Education information will improve Description: Including pain rating scale, medication(s)/side effects and non-pharmacologic comfort measures 12/14/2022 1250 by Milderd Meager, RN Outcome: Adequate for Discharge 12/14/2022 1054 by Milderd Meager, RN Outcome: Progressing   Problem: Health Behavior/Discharge Planning: Goal: Ability to manage health-related needs will improve Outcome: Adequate for Discharge   Problem: Clinical Measurements: Goal: Will remain free from infection Outcome: Adequate for Discharge Goal: Diagnostic test results will improve 12/14/2022 1250 by Milderd Meager, RN Outcome: Adequate for Discharge 12/14/2022 1054 by Milderd Meager, RN Outcome: Progressing Goal: Respiratory complications will improve Outcome: Adequate for Discharge Goal: Cardiovascular complication will be avoided Outcome: Adequate for Discharge   Problem: Activity: Goal: Risk for activity intolerance will decrease 12/14/2022 1250 by Milderd Meager, RN Outcome: Adequate for Discharge 12/14/2022 1054 by Milderd Meager, RN Outcome: Progressing   Problem: Nutrition: Goal: Adequate nutrition will be maintained 12/14/2022 1250 by Milderd Meager, RN Outcome: Adequate for Discharge 12/14/2022 1054 by Milderd Meager, RN Outcome: Progressing   Problem: Coping: Goal: Level of anxiety will decrease Outcome: Adequate for Discharge   Problem: Elimination: Goal: Will not experience complications related to bowel motility Outcome: Adequate  for Discharge Goal: Will not experience complications related to urinary retention Outcome: Adequate for Discharge   Problem: Pain Managment: Goal: General experience of comfort will improve Outcome: Adequate for Discharge   Problem: Safety: Goal: Ability to remain free from injury will improve 12/14/2022 1250 by Milderd Meager, RN Outcome: Adequate for Discharge 12/14/2022 1054 by Milderd Meager, RN Outcome: Progressing   Problem: Skin Integrity: Goal: Risk for impaired skin integrity will decrease Outcome: Adequate for Discharge

## 2022-12-14 NOTE — TOC Initial Note (Signed)
Transition of Care [facility]) - Initial/Assessment Note    Patient Details  Name: John Booker MRN: [id number] Date of Birth: [birth year]  Transition of Care [facility]) CM/SW Contact:    Liliana Cline, LCSW Phone Number: 12/14/2022, 12:05 PM  Clinical Narrative:                 Met with patient at bedside. Patient is from home alone. PCP is Dr. Burnadette Pop. Pharmacy is [facility]. Patient has a walker at home. Patient drives. Patient understands rec for Home Health. He declines Home Health, states "that will not be necessary." Patient was encouraged to reach out to PCP if he changes his mind about [facility] after discharge. Patient agreed. Patient states his daughter will transport him back home today.  Expected Discharge Plan: Home/Self Care Barriers to Discharge: Barriers Resolved   Patient Goals and CMS Choice Patient states their goals for this hospitalization and ongoing recovery are:: to go back home          Expected Discharge Plan and Services     Post Acute Care Choice: NA Living arrangements for the past 2 months: Single Family Home Expected Discharge Date: 12/14/22                                    Prior Living Arrangements/Services Living arrangements for the past 2 months: Single Family Home Lives with:: Self Patient language and need for interpreter reviewed:: Yes        Need for Family Participation in Patient Care: Yes (Comment) Care giver support system in place?: Yes (comment) Current home services: DME Criminal Activity/Legal Involvement Pertinent to Current Situation/Hospitalization: No - Comment as needed  Activities of Daily Living      Permission Sought/Granted                  Emotional Assessment       Orientation: : Oriented to Self, Oriented to Place, Oriented to  Time, Oriented to Situation Alcohol / Substance Use: Not Applicable Psych Involvement: No (comment)  Admission diagnosis:  Weakness [R53.1] Generalized weakness  [R53.1] Patient Active Problem List   Diagnosis Date Noted   Weakness 12/13/2022   Serum total bilirubin elevated 12/13/2022   Sepsis (HCC) 11/23/2022   Acute metabolic encephalopathy 11/23/2022   History of MSSA bacteremia secondary to foot wounds, s/p treatment 9/13 - 12/28/21 02/18/2022   SIRS (systemic inflammatory response syndrome) (HCC) 02/18/2022   Mediastinal mass 02/18/2022   Benign paraspinal mass 11/20/2021   Staphylococcal arthritis of vertebra (HCC)    Acute renal insufficiency    Non-traumatic rhabdomyolysis    GERD without esophagitis 11/16/2021   Hypokalemia 11/16/2021   Hyponatremia 11/16/2021   Bacteremia due to methicillin susceptible Staphylococcus aureus (MSSA) 11/16/2021   AKI (acute kidney injury) (HCC) 11/15/2021   Stage 3a chronic kidney disease (HCC) 11/07/2021   Protrusion of cervical intervertebral disc 07/23/2021   Exertional dyspnea 07/04/2021   History of alcohol abuse 07/04/2021   History of tobacco abuse 07/04/2021   Chronic diastolic CHF (congestive heart failure) (HCC) 06/26/2021   Headache 06/26/2021   Low serum albumin 05/10/2021   Malnutrition of moderate degree 12/28/2020   Esophageal dysmotility 12/28/2020   Generalized weakness 12/26/2020   COVID-19 vaccination declined 03/21/2020   Overweight (BMI 25.0-29.9) 12/20/2019   Blister of skin 11/07/2019   Neuropathy 04/04/2019   Pre-ulcerative calluses 10/07/2018   Skin lesion 10/07/2018   Plantar  flexed metatarsal bone of right foot 10/07/2018   Hiatal hernia    Stomach irritation    Intractable hiccups 10/16/2017   Elevated serum creatinine 07/15/2017   Bilateral lower extremity edema 05/08/2017   Alcohol abuse 03/06/2017   Alcohol dependence in sustained full remission (HCC) 03/05/2017   Essential hypertension 11/15/2015   Vitamin D deficiency 11/15/2015   PCP:  Marisue Ivan, MD Pharmacy:   [facility] - [place], [place] - [address] [place] [address] Phone: [phone] Fax: [phone]  [facility] [address] [place] - [address] [place] [address] Phone: [phone] Fax: [phone]     Social Determinants of Health (SDOH) Social History: SDOH Screenings   Food Insecurity: No Food Insecurity (12/09/2022)   Received from [facility]  Housing: Low Risk  (11/23/2022)  Transportation Needs: No Transportation Needs (12/09/2022)   Received from [facility]  Utilities: Not At Risk (12/09/2022)   Received from [facility]  Depression [phone]): Low Risk  (12/12/2021)  Financial Resource Strain: Low Risk  (12/09/2022)   Received from [facility]  Tobacco Use: Medium Risk (12/13/2022)   SDOH Interventions:     Readmission Risk Interventions     No data to display

## 2022-12-14 NOTE — Progress Notes (Signed)
OT Cancellation Note  Patient Details Name: John Booker MRN: [id number] DOB: November 14, 1950   Cancelled Treatment:    Reason Eval/Treat Not Completed: OT screened, no needs identified, will sign off. Chart reviewed, pt supine in bed upon OT arrival. Pt flatly refusing OT, stating he just walked with PT, is going home today, and is IND. Pt may benefit from [facility] OT services as PT is recommending HH PT. Sent message to [facility].   Shaylynne Lunt L. Ophelia Sipe, OTR/L  12/14/22, 12:01 PM

## 2022-12-14 NOTE — Progress Notes (Signed)
Physical Therapy Evaluation Patient Details Name: John Booker MRN: [id number] DOB: Jul 08, 1950 Today's Date: 12/14/2022  History of Present Illness  Pt is a 72 y/o male admitted secondary to generalized weakness and found to have hyponatremia and an AKI. Further medical work-up still in progress. PMH including but not limited to CHF, HTN, intractable hiccups followed by GI, mediastinal mass followed by oncology last seen 10/2021.  Clinical Impression  Pt presented supine in bed with HOB elevated, awake and willing to participate in therapy session. Prior to admission, pt reported that he ambulates with use of a RW as needed and was independent with ADLs. Pt lives alone in a single level apartment with several steps to enter (with rails). At the time of evaluation, pt able to complete bed mobility at a mod I level, transfers with supervision with use of RW and ambulated 100' with RW and CGA for safety. Pt would continue to benefit from skilled physical therapy services at this time while admitted and after d/c to address the below listed limitations in order to improve overall safety and independence with functional mobility.         If plan is discharge home, recommend the following: A little help with bathing/dressing/bathroom;A little help with walking and/or transfers;Assistance with cooking/housework;Help with stairs or ramp for entrance;Assist for transportation   Can travel by private vehicle        Equipment Recommendations None recommended by PT  Recommendations for Other Services       Functional Status Assessment Patient has had a recent decline in their functional status and demonstrates the ability to make significant improvements in function in a reasonable and predictable amount of time.     Precautions / Restrictions Precautions Precautions: Fall Restrictions Weight Bearing Restrictions: No      Mobility  Bed Mobility Overal bed mobility: Modified Independent                   Transfers Overall transfer level: Needs assistance Equipment used: Rolling walker (2 wheels) Transfers: Sit to/from Stand Sit to Stand: Supervision           General transfer comment: supervision for safety, no physical assistance required    Ambulation/Gait Ambulation/Gait assistance: Contact guard assist Gait Distance (Feet): 100 Feet Assistive device: Rolling walker (2 wheels) Gait Pattern/deviations: Step-through pattern, Decreased step length - right, Decreased step length - left, Decreased stride length Gait velocity: slightly decreased     General Gait Details: pt with slow, steady gait with use of RW, no overt LOB or need for physical assistance, CGA for safety  Stairs            Wheelchair Mobility     Tilt Bed    Modified Rankin (Stroke Patients Only)       Balance Overall balance assessment: Needs assistance, History of Falls Sitting-balance support: Feet supported Sitting balance-Leahy Scale: Good     Standing balance support: During functional activity, Bilateral upper extremity supported, Single extremity supported Standing balance-Leahy Scale: Poor                               Pertinent Vitals/Pain Pain Assessment Pain Assessment: No/denies pain    Home Living Family/patient expects to be discharged to:: Private residence Living Arrangements: Alone Available Help at Discharge: Family;Available PRN/intermittently Type of Home: Apartment Home Access: Stairs to enter Entrance Stairs-Rails: Right;Left Entrance Stairs-Number of Steps: 7   Home Layout: One level  Home Equipment: Agricultural consultant (2 wheels);Shower seat      Prior Function Prior Level of Function : Independent/Modified Independent;History of Falls (last six months);Driving             Mobility Comments: uses a RW PRN ADLs Comments: independent     Extremity/Trunk Assessment   Upper Extremity Assessment Upper Extremity Assessment:  Generalized weakness    Lower Extremity Assessment Lower Extremity Assessment: Generalized weakness       Communication   Communication Communication: Hearing impairment  Cognition Arousal: Alert Behavior During Therapy: Flat affect Overall Cognitive Status: Within Functional Limits for tasks assessed                                          General Comments      Exercises     Assessment/Plan    PT Assessment Patient needs continued PT services  PT Problem List Decreased strength;Decreased balance;Decreased mobility;Decreased coordination;Decreased knowledge of use of DME;Decreased safety awareness;Decreased knowledge of precautions       PT Treatment Interventions DME instruction;Gait training;Stair training;Functional mobility training;Therapeutic exercise;Therapeutic activities;Balance training;Neuromuscular re-education;Patient/family education    PT Goals (Current goals can be found in the Care Plan section)  Acute Rehab PT Goals Patient Stated Goal: to go home asap PT Goal Formulation: With patient Time For Goal Achievement: 12/28/22 Potential to Achieve Goals: Good    Frequency Min 1X/week     Co-evaluation               AM-PAC PT "6 Clicks" Mobility  Outcome Measure Help needed turning from your back to your side while in a flat bed without using bedrails?: None Help needed moving from lying on your back to sitting on the side of a flat bed without using bedrails?: None Help needed moving to and from a bed to a chair (including a wheelchair)?: A Little Help needed standing up from a chair using your arms (e.g., wheelchair or bedside chair)?: A Little Help needed to walk in hospital room?: A Little Help needed climbing 3-5 steps with a railing? : A Little 6 Click Score: 20    End of Session Equipment Utilized During Treatment: Gait belt Activity Tolerance: Patient tolerated treatment well Patient left: in bed;with call bell/phone  within reach;with nursing/sitter in room;Other (comment) (RN in room with pt sitting EOB) Nurse Communication: Mobility status PT Visit Diagnosis: Muscle weakness (generalized) (M62.81)    Time: 1610-9604 PT Time Calculation (min) (ACUTE ONLY): 24 min   Charges:   PT Evaluation $PT Eval Low Complexity: 1 Low PT Treatments $Gait Training: 8-22 mins PT General Charges $$ ACUTE PT VISIT: 1 Visit         Arletta Bale, DPT  Acute Rehabilitation Services Office [phone]   Alessandra Bevels Zemira Zehring 12/14/2022, 11:05 AM

## 2022-12-14 NOTE — Discharge Summary (Signed)
additional instructions   losartan 100 MG tablet Commonly known as: COZAAR Take 1 tablet (100 mg total) by mouth daily.   metoCLOPramide 10 MG tablet Commonly known as: REGLAN Take 10 mg by mouth 4 (four) times daily.   metoprolol succinate 25 MG 24 hr tablet Commonly known as: TOPROL-XL Take 1 tablet (25 mg total) by mouth daily.   Nasal Relief 0.05 % nasal spray Generic drug:  oxymetazoline Place 1 spray into both nostrils 2 (two) times daily as needed.   ondansetron 4 MG tablet Commonly known as: ZOFRAN Take 1 tablet (4 mg total) by mouth every 8 (eight) hours as needed for nausea or vomiting.   pantoprazole 40 MG tablet Commonly known as: PROTONIX TAKE ONE (1) TABLET BY MOUTH TWO TIMES PER DAY   potassium chloride SA 20 MEQ tablet Commonly known as: KLOR-CON M Take 1 tablet (20 mEq total) by mouth daily.   sodium chloride 0.65 % Soln nasal spray Commonly known as: OCEAN Place 1 spray into both nostrils as needed for congestion (nose irritation).        Follow-up Information     John Ivan, MD. Schedule an appointment as soon as possible for a visit in 1 week(s).   Specialty: Family Medicine Contact information: [address] [facility] [address] [phone]                Allergies  Allergen Reactions   Adalat [Nifedipine]     Foot swelling.    Amlodipine Other (See Comments)    Leg swelling    Consultations: None   Procedures/Studies: US Abdomen Limited RUQ (LIVER/GB)  Result Date: 12/13/2022 CLINICAL DATA:  Elevated serum bilirubin, generalized weakness EXAM: ULTRASOUND ABDOMEN LIMITED RIGHT UPPER QUADRANT COMPARISON:  CT 11/23/2022 FINDINGS: Gallbladder: Partially decompressed. No gallstones or abnormal wall thickening visualized. No sonographic Murphy sign noted by sonographer. Common bile duct: Diameter: 5 mm in proximal diameter Liver: No focal lesion identified. Within normal limits in parenchymal echogenicity. Portal vein is patent on color Doppler imaging with normal direction of blood flow towards the liver. Other: None. IMPRESSION: 1. Normal right upper quadrant sonogram. Electronically Signed   By: John Booker M.D.   On: 12/13/2022 20:53   DG Chest 2 View  Result Date: 12/13/2022 CLINICAL DATA:  Sepsis. EXAM: CHEST - 2 VIEW COMPARISON:  X-ray 11/22/2022 and CT scan FINDINGS:  Hyperinflation. No consolidation, pneumothorax or effusion. No edema. Normal cardiopericardial silhouette. Calcified aorta. Diffuse syndesmophytes along the spine IMPRESSION: Hyperinflation.  No acute cardiopulmonary disease Electronically Signed   By: John Booker M.D.   On: 12/13/2022 13:43   ECHOCARDIOGRAM COMPLETE  Result Date: 11/24/2022    ECHOCARDIOGRAM REPORT   Patient Name:   John Booker Date of Exam: 11/24/2022 Medical Rec #:  [id number]      Height:       75.0 in Accession #:    [id number]     Weight:       208.6 lb Date of Birth:  Apr 24, 1950      BSA:          2.234 m Patient Age:    71 years       BP:           173/90 mmHg Patient Gender: M              HR:           102 bpm. Exam Location:  [facility] Procedure: 2D Echo, Cardiac Doppler  Physician Discharge Summary  [name] FAO:[id number] DOB: Feb 14, 1951 DOA: 12/13/2022  PCP: John Ivan, MD  Admit date: 12/13/2022 Discharge date: 12/14/2022  Admitted From: Home Disposition:  Home  Recommendations for Outpatient Follow-up:  Follow up with PCP in 1-2 weeks Ambulatory referral to neurology  Home Health: No Equipment/Devices: None  Discharge Condition: Stable CODE STATUS: Full Diet recommendation: Regular  Brief/Interim Summary: 72 year old male with history of sepsis with bacteremia including MSSA bacteremia in September 2023, Klebsiella oxytoca bacteremia in September 2024, hypertension, chronic hiccups follows with GI, who presents to the emergency department for chief concerns of generalized weakness.   Patient states he has been dealing with hiccups chronically for the last 5 years.  These have prevented him from eating effectively and he has suffered approximately a 60 pound weight loss in that time.  He has been on several medications including gabapentin, baclofen, metoclopramide, Thorazine.  Thorazine provide some relief    Discharge Diagnoses:  Principal Problem:   Weakness Active Problems:   AKI (acute kidney injury) (HCC)   Hyponatremia   Essential hypertension   Vitamin D deficiency   Esophageal dysmotility   History of alcohol abuse   History of tobacco abuse   Benign paraspinal mass   Serum total bilirubin elevated  Intractable hiccups Patient has been dealing with this issue for the last 5 years without a known cause or effective therapy.  He has attempted baclofen, gabapentin, metoclopramide, Thorazine.  He states that Thorazine does provide some relief.  On review of his medical record it appears he was on Thorazine 10 mg twice daily.  This is likely a suboptimal dose though further outpatient evaluation should be undertaken.  Infection felt to be unlikely.  Patient not septic appearing.  Afebrile, normal electrolytes, no white  blood cell count.  Feel the patient is safe for discharge from the hospital at this time.  We had a lengthy discussion regarding his intractable hiccups.  I also discussed the case with inpatient neurology consultant.  At time of discharge I will recommend a trial of increased dose chlorpromazine.  Will prescribe 25 mg p.o. twice daily to 3 times daily.  60 pills prescribed.  Patient cautioned on the potential side effects of tardive dyskinesia and bone marrow suppression with chronic metoclopramide use.  He will need to see outpatient physicians to consider refills of this medication.  Follow-up outpatient PCP.  I have placed an ambulatory referral to neurology [facility] clinic.  Discharge Instructions  Discharge Instructions     Ambulatory referral to Neurology   Complete by: As directed    Diet - low sodium heart healthy   Complete by: As directed    Increase activity slowly   Complete by: As directed       Allergies as of 12/14/2022       Reactions   Adalat [nifedipine]    Foot swelling.    Amlodipine Other (See Comments)   Leg swelling        Medication List     STOP taking these medications    furosemide 40 MG tablet Commonly known as: LASIX   gabapentin 100 MG capsule Commonly known as: NEURONTIN       TAKE these medications    chlorproMAZINE 25 MG tablet Commonly known as: THORAZINE Take 1 tablet (25 mg total) by mouth 3 (three) times daily for 20 days. Take 1 tab 2-3 times daily for intractable hiccups What changed:  medication strength how much to take when to take this  additional instructions   losartan 100 MG tablet Commonly known as: COZAAR Take 1 tablet (100 mg total) by mouth daily.   metoCLOPramide 10 MG tablet Commonly known as: REGLAN Take 10 mg by mouth 4 (four) times daily.   metoprolol succinate 25 MG 24 hr tablet Commonly known as: TOPROL-XL Take 1 tablet (25 mg total) by mouth daily.   Nasal Relief 0.05 % nasal spray Generic drug:  oxymetazoline Place 1 spray into both nostrils 2 (two) times daily as needed.   ondansetron 4 MG tablet Commonly known as: ZOFRAN Take 1 tablet (4 mg total) by mouth every 8 (eight) hours as needed for nausea or vomiting.   pantoprazole 40 MG tablet Commonly known as: PROTONIX TAKE ONE (1) TABLET BY MOUTH TWO TIMES PER DAY   potassium chloride SA 20 MEQ tablet Commonly known as: KLOR-CON M Take 1 tablet (20 mEq total) by mouth daily.   sodium chloride 0.65 % Soln nasal spray Commonly known as: OCEAN Place 1 spray into both nostrils as needed for congestion (nose irritation).        Follow-up Information     John Ivan, MD. Schedule an appointment as soon as possible for a visit in 1 week(s).   Specialty: Family Medicine Contact information: [address] [facility] [place] [address] [phone]                Allergies  Allergen Reactions   Adalat [Nifedipine]     Foot swelling.    Amlodipine Other (See Comments)    Leg swelling    Consultations: None   Procedures/Studies: US Abdomen Limited RUQ (LIVER/GB)  Result Date: 12/13/2022 CLINICAL DATA:  Elevated serum bilirubin, generalized weakness EXAM: ULTRASOUND ABDOMEN LIMITED RIGHT UPPER QUADRANT COMPARISON:  CT 11/23/2022 FINDINGS: Gallbladder: Partially decompressed. No gallstones or abnormal wall thickening visualized. No sonographic Murphy sign noted by sonographer. Common bile duct: Diameter: 5 mm in proximal diameter Liver: No focal lesion identified. Within normal limits in parenchymal echogenicity. Portal vein is patent on color Doppler imaging with normal direction of blood flow towards the liver. Other: None. IMPRESSION: 1. Normal right upper quadrant sonogram. Electronically Signed   By: John Booker M.D.   On: 12/13/2022 20:53   DG Chest 2 View  Result Date: 12/13/2022 CLINICAL DATA:  Sepsis. EXAM: CHEST - 2 VIEW COMPARISON:  X-ray 11/22/2022 and CT scan FINDINGS:  Hyperinflation. No consolidation, pneumothorax or effusion. No edema. Normal cardiopericardial silhouette. Calcified aorta. Diffuse syndesmophytes along the spine IMPRESSION: Hyperinflation.  No acute cardiopulmonary disease Electronically Signed   By: John Booker M.D.   On: 12/13/2022 13:43   ECHOCARDIOGRAM COMPLETE  Result Date: 11/24/2022    ECHOCARDIOGRAM REPORT   Patient Name:   John Booker Date of Exam: 11/24/2022 Medical Rec #:  [id number]      Height:       75.0 in Accession #:    [id number]     Weight:       208.6 lb Date of Birth:  Apr 24, 1950      BSA:          2.234 m Patient Age:    71 years       BP:           173/90 mmHg Patient Gender: M              HR:           102 bpm. Exam Location:  [facility] Procedure: 2D Echo, Cardiac Doppler  Physician Discharge Summary  [name] FAO:[id number] DOB: Feb 14, 1951 DOA: 12/13/2022  PCP: John Ivan, MD  Admit date: 12/13/2022 Discharge date: 12/14/2022  Admitted From: Home Disposition:  Home  Recommendations for Outpatient Follow-up:  Follow up with PCP in 1-2 weeks Ambulatory referral to neurology  Home Health: No Equipment/Devices: None  Discharge Condition: Stable CODE STATUS: Full Diet recommendation: Regular  Brief/Interim Summary: 72 year old male with history of sepsis with bacteremia including MSSA bacteremia in September 2023, Klebsiella oxytoca bacteremia in September 2024, hypertension, chronic hiccups follows with GI, who presents to the emergency department for chief concerns of generalized weakness.   Patient states he has been dealing with hiccups chronically for the last 5 years.  These have prevented him from eating effectively and he has suffered approximately a 60 pound weight loss in that time.  He has been on several medications including gabapentin, baclofen, metoclopramide, Thorazine.  Thorazine provide some relief    Discharge Diagnoses:  Principal Problem:   Weakness Active Problems:   AKI (acute kidney injury) (HCC)   Hyponatremia   Essential hypertension   Vitamin D deficiency   Esophageal dysmotility   History of alcohol abuse   History of tobacco abuse   Benign paraspinal mass   Serum total bilirubin elevated  Intractable hiccups Patient has been dealing with this issue for the last 5 years without a known cause or effective therapy.  He has attempted baclofen, gabapentin, metoclopramide, Thorazine.  He states that Thorazine does provide some relief.  On review of his medical record it appears he was on Thorazine 10 mg twice daily.  This is likely a suboptimal dose though further outpatient evaluation should be undertaken.  Infection felt to be unlikely.  Patient not septic appearing.  Afebrile, normal electrolytes, no white  blood cell count.  Feel the patient is safe for discharge from the hospital at this time.  We had a lengthy discussion regarding his intractable hiccups.  I also discussed the case with inpatient neurology consultant.  At time of discharge I will recommend a trial of increased dose chlorpromazine.  Will prescribe 25 mg p.o. twice daily to 3 times daily.  60 pills prescribed.  Patient cautioned on the potential side effects of tardive dyskinesia and bone marrow suppression with chronic metoclopramide use.  He will need to see outpatient physicians to consider refills of this medication.  Follow-up outpatient PCP.  I have placed an ambulatory referral to neurology [facility] clinic.  Discharge Instructions  Discharge Instructions     Ambulatory referral to Neurology   Complete by: As directed    Diet - low sodium heart healthy   Complete by: As directed    Increase activity slowly   Complete by: As directed       Allergies as of 12/14/2022       Reactions   Adalat [nifedipine]    Foot swelling.    Amlodipine Other (See Comments)   Leg swelling        Medication List     STOP taking these medications    furosemide 40 MG tablet Commonly known as: LASIX   gabapentin 100 MG capsule Commonly known as: NEURONTIN       TAKE these medications    chlorproMAZINE 25 MG tablet Commonly known as: THORAZINE Take 1 tablet (25 mg total) by mouth 3 (three) times daily for 20 days. Take 1 tab 2-3 times daily for intractable hiccups What changed:  medication strength how much to take when to take this  additional instructions   losartan 100 MG tablet Commonly known as: COZAAR Take 1 tablet (100 mg total) by mouth daily.   metoCLOPramide 10 MG tablet Commonly known as: REGLAN Take 10 mg by mouth 4 (four) times daily.   metoprolol succinate 25 MG 24 hr tablet Commonly known as: TOPROL-XL Take 1 tablet (25 mg total) by mouth daily.   Nasal Relief 0.05 % nasal spray Generic drug:  oxymetazoline Place 1 spray into both nostrils 2 (two) times daily as needed.   ondansetron 4 MG tablet Commonly known as: ZOFRAN Take 1 tablet (4 mg total) by mouth every 8 (eight) hours as needed for nausea or vomiting.   pantoprazole 40 MG tablet Commonly known as: PROTONIX TAKE ONE (1) TABLET BY MOUTH TWO TIMES PER DAY   potassium chloride SA 20 MEQ tablet Commonly known as: KLOR-CON M Take 1 tablet (20 mEq total) by mouth daily.   sodium chloride 0.65 % Soln nasal spray Commonly known as: OCEAN Place 1 spray into both nostrils as needed for congestion (nose irritation).        Follow-up Information     John Ivan, MD. Schedule an appointment as soon as possible for a visit in 1 week(s).   Specialty: Family Medicine Contact information: [address] [facility] [place] [address] [phone]                Allergies  Allergen Reactions   Adalat [Nifedipine]     Foot swelling.    Amlodipine Other (See Comments)    Leg swelling    Consultations: None   Procedures/Studies: US Abdomen Limited RUQ (LIVER/GB)  Result Date: 12/13/2022 CLINICAL DATA:  Elevated serum bilirubin, generalized weakness EXAM: ULTRASOUND ABDOMEN LIMITED RIGHT UPPER QUADRANT COMPARISON:  CT 11/23/2022 FINDINGS: Gallbladder: Partially decompressed. No gallstones or abnormal wall thickening visualized. No sonographic Murphy sign noted by sonographer. Common bile duct: Diameter: 5 mm in proximal diameter Liver: No focal lesion identified. Within normal limits in parenchymal echogenicity. Portal vein is patent on color Doppler imaging with normal direction of blood flow towards the liver. Other: None. IMPRESSION: 1. Normal right upper quadrant sonogram. Electronically Signed   By: John Booker M.D.   On: 12/13/2022 20:53   DG Chest 2 View  Result Date: 12/13/2022 CLINICAL DATA:  Sepsis. EXAM: CHEST - 2 VIEW COMPARISON:  X-ray 11/22/2022 and CT scan FINDINGS:  Hyperinflation. No consolidation, pneumothorax or effusion. No edema. Normal cardiopericardial silhouette. Calcified aorta. Diffuse syndesmophytes along the spine IMPRESSION: Hyperinflation.  No acute cardiopulmonary disease Electronically Signed   By: John Booker M.D.   On: 12/13/2022 13:43   ECHOCARDIOGRAM COMPLETE  Result Date: 11/24/2022    ECHOCARDIOGRAM REPORT   Patient Name:   John Booker Date of Exam: 11/24/2022 Medical Rec #:  [id number]      Height:       75.0 in Accession #:    [id number]     Weight:       208.6 lb Date of Birth:  Apr 24, 1950      BSA:          2.234 m Patient Age:    71 years       BP:           173/90 mmHg Patient Gender: M              HR:           102 bpm. Exam Location:  [facility] Procedure: 2D Echo, Cardiac Doppler  additional instructions   losartan 100 MG tablet Commonly known as: COZAAR Take 1 tablet (100 mg total) by mouth daily.   metoCLOPramide 10 MG tablet Commonly known as: REGLAN Take 10 mg by mouth 4 (four) times daily.   metoprolol succinate 25 MG 24 hr tablet Commonly known as: TOPROL-XL Take 1 tablet (25 mg total) by mouth daily.   Nasal Relief 0.05 % nasal spray Generic drug:  oxymetazoline Place 1 spray into both nostrils 2 (two) times daily as needed.   ondansetron 4 MG tablet Commonly known as: ZOFRAN Take 1 tablet (4 mg total) by mouth every 8 (eight) hours as needed for nausea or vomiting.   pantoprazole 40 MG tablet Commonly known as: PROTONIX TAKE ONE (1) TABLET BY MOUTH TWO TIMES PER DAY   potassium chloride SA 20 MEQ tablet Commonly known as: KLOR-CON M Take 1 tablet (20 mEq total) by mouth daily.   sodium chloride 0.65 % Soln nasal spray Commonly known as: OCEAN Place 1 spray into both nostrils as needed for congestion (nose irritation).        Follow-up Information     John Ivan, MD. Schedule an appointment as soon as possible for a visit in 1 week(s).   Specialty: Family Medicine Contact information: [address] [facility] [place] [address] [phone]                Allergies  Allergen Reactions   Adalat [Nifedipine]     Foot swelling.    Amlodipine Other (See Comments)    Leg swelling    Consultations: None   Procedures/Studies: US Abdomen Limited RUQ (LIVER/GB)  Result Date: 12/13/2022 CLINICAL DATA:  Elevated serum bilirubin, generalized weakness EXAM: ULTRASOUND ABDOMEN LIMITED RIGHT UPPER QUADRANT COMPARISON:  CT 11/23/2022 FINDINGS: Gallbladder: Partially decompressed. No gallstones or abnormal wall thickening visualized. No sonographic Murphy sign noted by sonographer. Common bile duct: Diameter: 5 mm in proximal diameter Liver: No focal lesion identified. Within normal limits in parenchymal echogenicity. Portal vein is patent on color Doppler imaging with normal direction of blood flow towards the liver. Other: None. IMPRESSION: 1. Normal right upper quadrant sonogram. Electronically Signed   By: John Booker M.D.   On: 12/13/2022 20:53   DG Chest 2 View  Result Date: 12/13/2022 CLINICAL DATA:  Sepsis. EXAM: CHEST - 2 VIEW COMPARISON:  X-ray 11/22/2022 and CT scan FINDINGS:  Hyperinflation. No consolidation, pneumothorax or effusion. No edema. Normal cardiopericardial silhouette. Calcified aorta. Diffuse syndesmophytes along the spine IMPRESSION: Hyperinflation.  No acute cardiopulmonary disease Electronically Signed   By: John Booker M.D.   On: 12/13/2022 13:43   ECHOCARDIOGRAM COMPLETE  Result Date: 11/24/2022    ECHOCARDIOGRAM REPORT   Patient Name:   John Booker Date of Exam: 11/24/2022 Medical Rec #:  [id number]      Height:       75.0 in Accession #:    [id number]     Weight:       208.6 lb Date of Birth:  Apr 24, 1950      BSA:          2.234 m Patient Age:    71 years       BP:           173/90 mmHg Patient Gender: M              HR:           102 bpm. Exam Location:  [facility] Procedure: 2D Echo, Cardiac Doppler  additional instructions   losartan 100 MG tablet Commonly known as: COZAAR Take 1 tablet (100 mg total) by mouth daily.   metoCLOPramide 10 MG tablet Commonly known as: REGLAN Take 10 mg by mouth 4 (four) times daily.   metoprolol succinate 25 MG 24 hr tablet Commonly known as: TOPROL-XL Take 1 tablet (25 mg total) by mouth daily.   Nasal Relief 0.05 % nasal spray Generic drug:  oxymetazoline Place 1 spray into both nostrils 2 (two) times daily as needed.   ondansetron 4 MG tablet Commonly known as: ZOFRAN Take 1 tablet (4 mg total) by mouth every 8 (eight) hours as needed for nausea or vomiting.   pantoprazole 40 MG tablet Commonly known as: PROTONIX TAKE ONE (1) TABLET BY MOUTH TWO TIMES PER DAY   potassium chloride SA 20 MEQ tablet Commonly known as: KLOR-CON M Take 1 tablet (20 mEq total) by mouth daily.   sodium chloride 0.65 % Soln nasal spray Commonly known as: OCEAN Place 1 spray into both nostrils as needed for congestion (nose irritation).        Follow-up Information     John Ivan, MD. Schedule an appointment as soon as possible for a visit in 1 week(s).   Specialty: Family Medicine Contact information: [address] [facility] [place] [address] [phone]                Allergies  Allergen Reactions   Adalat [Nifedipine]     Foot swelling.    Amlodipine Other (See Comments)    Leg swelling    Consultations: None   Procedures/Studies: US Abdomen Limited RUQ (LIVER/GB)  Result Date: 12/13/2022 CLINICAL DATA:  Elevated serum bilirubin, generalized weakness EXAM: ULTRASOUND ABDOMEN LIMITED RIGHT UPPER QUADRANT COMPARISON:  CT 11/23/2022 FINDINGS: Gallbladder: Partially decompressed. No gallstones or abnormal wall thickening visualized. No sonographic Murphy sign noted by sonographer. Common bile duct: Diameter: 5 mm in proximal diameter Liver: No focal lesion identified. Within normal limits in parenchymal echogenicity. Portal vein is patent on color Doppler imaging with normal direction of blood flow towards the liver. Other: None. IMPRESSION: 1. Normal right upper quadrant sonogram. Electronically Signed   By: John Booker M.D.   On: 12/13/2022 20:53   DG Chest 2 View  Result Date: 12/13/2022 CLINICAL DATA:  Sepsis. EXAM: CHEST - 2 VIEW COMPARISON:  X-ray 11/22/2022 and CT scan FINDINGS:  Hyperinflation. No consolidation, pneumothorax or effusion. No edema. Normal cardiopericardial silhouette. Calcified aorta. Diffuse syndesmophytes along the spine IMPRESSION: Hyperinflation.  No acute cardiopulmonary disease Electronically Signed   By: John Booker M.D.   On: 12/13/2022 13:43   ECHOCARDIOGRAM COMPLETE  Result Date: 11/24/2022    ECHOCARDIOGRAM REPORT   Patient Name:   John Booker Date of Exam: 11/24/2022 Medical Rec #:  [id number]      Height:       75.0 in Accession #:    [id number]     Weight:       208.6 lb Date of Birth:  Apr 24, 1950      BSA:          2.234 m Patient Age:    71 years       BP:           173/90 mmHg Patient Gender: M              HR:           102 bpm. Exam Location:  [facility] Procedure: 2D Echo, Cardiac Doppler  Physician Discharge Summary  [name] FAO:[id number] DOB: Feb 14, 1951 DOA: 12/13/2022  PCP: John Ivan, MD  Admit date: 12/13/2022 Discharge date: 12/14/2022  Admitted From: Home Disposition:  Home  Recommendations for Outpatient Follow-up:  Follow up with PCP in 1-2 weeks Ambulatory referral to neurology  Home Health: No Equipment/Devices: None  Discharge Condition: Stable CODE STATUS: Full Diet recommendation: Regular  Brief/Interim Summary: 72 year old male with history of sepsis with bacteremia including MSSA bacteremia in September 2023, Klebsiella oxytoca bacteremia in September 2024, hypertension, chronic hiccups follows with GI, who presents to the emergency department for chief concerns of generalized weakness.   Patient states he has been dealing with hiccups chronically for the last 5 years.  These have prevented him from eating effectively and he has suffered approximately a 60 pound weight loss in that time.  He has been on several medications including gabapentin, baclofen, metoclopramide, Thorazine.  Thorazine provide some relief    Discharge Diagnoses:  Principal Problem:   Weakness Active Problems:   AKI (acute kidney injury) (HCC)   Hyponatremia   Essential hypertension   Vitamin D deficiency   Esophageal dysmotility   History of alcohol abuse   History of tobacco abuse   Benign paraspinal mass   Serum total bilirubin elevated  Intractable hiccups Patient has been dealing with this issue for the last 5 years without a known cause or effective therapy.  He has attempted baclofen, gabapentin, metoclopramide, Thorazine.  He states that Thorazine does provide some relief.  On review of his medical record it appears he was on Thorazine 10 mg twice daily.  This is likely a suboptimal dose though further outpatient evaluation should be undertaken.  Infection felt to be unlikely.  Patient not septic appearing.  Afebrile, normal electrolytes, no white  blood cell count.  Feel the patient is safe for discharge from the hospital at this time.  We had a lengthy discussion regarding his intractable hiccups.  I also discussed the case with inpatient neurology consultant.  At time of discharge I will recommend a trial of increased dose chlorpromazine.  Will prescribe 25 mg p.o. twice daily to 3 times daily.  60 pills prescribed.  Patient cautioned on the potential side effects of tardive dyskinesia and bone marrow suppression with chronic metoclopramide use.  He will need to see outpatient physicians to consider refills of this medication.  Follow-up outpatient PCP.  I have placed an ambulatory referral to neurology [facility] clinic.  Discharge Instructions  Discharge Instructions     Ambulatory referral to Neurology   Complete by: As directed    Diet - low sodium heart healthy   Complete by: As directed    Increase activity slowly   Complete by: As directed       Allergies as of 12/14/2022       Reactions   Adalat [nifedipine]    Foot swelling.    Amlodipine Other (See Comments)   Leg swelling        Medication List     STOP taking these medications    furosemide 40 MG tablet Commonly known as: LASIX   gabapentin 100 MG capsule Commonly known as: NEURONTIN       TAKE these medications    chlorproMAZINE 25 MG tablet Commonly known as: THORAZINE Take 1 tablet (25 mg total) by mouth 3 (three) times daily for 20 days. Take 1 tab 2-3 times daily for intractable hiccups What changed:  medication strength how much to take when to take this

## 2022-12-14 NOTE — Care Management CC44 (Signed)
Condition Code 44 Documentation Completed  Patient Details  Name: VIRGIL LIGHTNER MRN: [id number] Date of Birth: 30-Jan-1951   Condition Code 44 given:  Yes Patient signature on Condition Code 44 notice:  Yes Documentation of 2 MD's agreement:  Yes Code 44 added to claim:  Yes    Dorine Duffey E Jadelynn Boylan, LCSW 12/14/2022, 12:03 PM

## 2022-12-15 NOTE — Telephone Encounter (Addendum)
Called patient's daughter Shari Prows and left her a voicemail to call me back to let me know a bit more about her father's hiccups.

## 2022-12-15 NOTE — Telephone Encounter (Signed)
Dr. Tobi Bastos, I called patient's daughter Shari Prows back and she did not answer and left her a voicemail to call me back. However, looking at his chart, I've seen that he was taken to the ED due to his hiccups. Please review and let me know if you want me to do anything about it or wait again for his daughter to call me back. Please advise.

## 2022-12-17 NOTE — Telephone Encounter (Signed)
Dr. Tobi Bastos, this is the plan for the patient when he was discharged:  Recommendations for Outpatient Follow-up:  Follow up with PCP in 1-2 weeks Ambulatory referral to neurology

## 2022-12-18 LAB — CULTURE, BLOOD (ROUTINE X 2)
Culture: NO GROWTH
Culture: NO GROWTH
Special Requests: ADEQUATE
Special Requests: ADEQUATE

## 2022-12-30 ENCOUNTER — Encounter: Payer: Self-pay | Admitting: Ophthalmology

## 2023-01-07 NOTE — Discharge Instructions (Signed)

## 2023-01-08 ENCOUNTER — Encounter
Admission: RE | Disposition: A | Discharge status: Home / Self Care | Payer: Self-pay | Source: Home / Self Care | Attending: Ophthalmology

## 2023-01-08 ENCOUNTER — Ambulatory Visit
Admission: RE | Admit: 2023-01-08 | Discharge: 2023-01-08 | Disposition: A | Discharge status: Home / Self Care | Payer: Medicare Other | Attending: Ophthalmology | Admitting: Ophthalmology

## 2023-01-08 ENCOUNTER — Other Ambulatory Visit: Payer: Self-pay

## 2023-01-08 ENCOUNTER — Ambulatory Visit: Payer: Medicare Other | Admitting: Anesthesiology

## 2023-01-08 ENCOUNTER — Encounter: Payer: Self-pay | Admitting: Ophthalmology

## 2023-01-08 DIAGNOSIS — I509 Heart failure, unspecified: Secondary | ICD-10-CM | POA: Diagnosis not present

## 2023-01-08 DIAGNOSIS — Z87891 Personal history of nicotine dependence: Secondary | ICD-10-CM | POA: Diagnosis not present

## 2023-01-08 DIAGNOSIS — N189 Chronic kidney disease, unspecified: Secondary | ICD-10-CM | POA: Insufficient documentation

## 2023-01-08 DIAGNOSIS — I130 Hypertensive heart and chronic kidney disease with heart failure and stage 1 through stage 4 chronic kidney disease, or unspecified chronic kidney disease: Secondary | ICD-10-CM | POA: Insufficient documentation

## 2023-01-08 DIAGNOSIS — K219 Gastro-esophageal reflux disease without esophagitis: Secondary | ICD-10-CM | POA: Insufficient documentation

## 2023-01-08 DIAGNOSIS — R066 Hiccough: Secondary | ICD-10-CM | POA: Insufficient documentation

## 2023-01-08 DIAGNOSIS — H2511 Age-related nuclear cataract, right eye: Secondary | ICD-10-CM | POA: Diagnosis present

## 2023-01-08 DIAGNOSIS — K449 Diaphragmatic hernia without obstruction or gangrene: Secondary | ICD-10-CM | POA: Diagnosis not present

## 2023-01-08 HISTORY — PX: CATARACT EXTRACTION W/PHACO: SHX586

## 2023-01-08 SURGERY — PHACOEMULSIFICATION, CATARACT, WITH IOL INSERTION
Anesthesia: Monitor Anesthesia Care | Laterality: Right

## 2023-01-08 MED ORDER — LIDOCAINE HCL (PF) 2 % IJ SOLN
INTRAOCULAR | Status: DC | PRN
  Administered 2023-01-08: 4 mL via INTRAOCULAR

## 2023-01-08 MED ORDER — FENTANYL CITRATE (PF) 100 MCG/2ML IJ SOLN
INTRAMUSCULAR | Status: DC | PRN
  Administered 2023-01-08: 100 ug via INTRAVENOUS

## 2023-01-08 MED ORDER — ARMC OPHTHALMIC DILATING DROPS
OPHTHALMIC | Status: AC
  Filled 2023-01-08: qty 0.5

## 2023-01-08 MED ORDER — SIGHTPATH DOSE#1 NA HYALUR & NA CHOND-NA HYALUR IO KIT
PACK | INTRAOCULAR | Status: DC | PRN
  Administered 2023-01-08: 1 via OPHTHALMIC

## 2023-01-08 MED ORDER — FENTANYL CITRATE (PF) 100 MCG/2ML IJ SOLN
INTRAMUSCULAR | Status: AC
  Filled 2023-01-08: qty 2

## 2023-01-08 MED ORDER — BRIMONIDINE TARTRATE-TIMOLOL 0.2-0.5 % OP SOLN
OPHTHALMIC | Status: DC | PRN
  Administered 2023-01-08: 1 [drp] via OPHTHALMIC

## 2023-01-08 MED ORDER — SODIUM CHLORIDE 0.9% FLUSH: 10.0000 mL | Freq: Two times a day (BID) | INTRAVENOUS | Status: DC

## 2023-01-08 MED ORDER — TETRACAINE HCL 0.5 % OP SOLN
OPHTHALMIC | Status: AC
  Filled 2023-01-08: qty 4

## 2023-01-08 MED ORDER — MIDAZOLAM HCL 2 MG/2ML IJ SOLN
INTRAMUSCULAR | Status: AC
  Filled 2023-01-08: qty 2

## 2023-01-08 MED ORDER — TETRACAINE HCL 0.5 % OP SOLN
1.0000 [drp] | OPHTHALMIC | Status: DC | PRN
  Administered 2023-01-08 (×3): 1 [drp] via OPHTHALMIC

## 2023-01-08 MED ORDER — MIDAZOLAM HCL 2 MG/2ML IJ SOLN
INTRAMUSCULAR | Status: DC | PRN
  Administered 2023-01-08: 1 mg via INTRAVENOUS
  Administered 2023-01-08: 2 mg via INTRAVENOUS

## 2023-01-08 MED ORDER — ARMC OPHTHALMIC DILATING DROPS
1.0000 | OPHTHALMIC | Status: DC | PRN
  Administered 2023-01-08 (×3): 1 via OPHTHALMIC

## 2023-01-08 MED ORDER — PHENYLEPHRINE-KETOROLAC 1-0.3 % IO SOLN
INTRAOCULAR | Status: DC | PRN
  Administered 2023-01-08: 72 mL via OPHTHALMIC

## 2023-01-08 MED ORDER — MOXIFLOXACIN HCL 0.5 % OP SOLN
OPHTHALMIC | Status: DC | PRN
  Administered 2023-01-08: 1 [drp] via OPHTHALMIC

## 2023-01-08 SURGICAL SUPPLY — 10 items
CATARACT SUITE SIGHTPATH (MISCELLANEOUS) ×1
DISSECTOR HYDRO NUCLEUS 50X22 (MISCELLANEOUS) ×1 IMPLANT
DRSG TEGADERM 2-3/8X2-3/4 SM (GAUZE/BANDAGES/DRESSINGS) ×1 IMPLANT
FEE CATARACT SUITE SIGHTPATH (MISCELLANEOUS) ×1 IMPLANT
GLOVE SURG SYN 7.5 E (GLOVE) ×1
GLOVE SURG SYN 7.5 PF PI (GLOVE) ×1 IMPLANT
GLOVE SURG SYN 8.5 E (GLOVE) ×1
GLOVE SURG SYN 8.5 PF PI (GLOVE) ×1 IMPLANT
LENS ENVISTA 19.5 ×1 IMPLANT
LENS IOL ENVISTA UV+ 19.5 IMPLANT

## 2023-01-08 NOTE — Op Note (Signed)
OPERATIVE NOTE  John Booker [id number] 01/08/2023   PREOPERATIVE DIAGNOSIS: Nuclear sclerotic cataract right eye. H25.11   POSTOPERATIVE DIAGNOSIS: Nuclear sclerotic cataract right eye. H25.11   PROCEDURE:  Phacoemusification with posterior chamber intraocular lens placement of the right eye  Ultrasound time: Procedure(s): CATARACT EXTRACTION PHACO AND INTRAOCULAR LENS PLACEMENT (IOC) RIGHT OMIDRIA 14.23 01:16.5 (Right)  LENS:   Implant Name Type Inv. Item Serial No. Manufacturer Lot No. LRB No. Used Action  LENS ENVISTA 19.5 - [id number]  LENS ENVISTA 19.5 [id number] [facility] [id number] Right 1 Implanted      SURGEON:  Julious Payer. Rolley Sims, MD   ANESTHESIA:  Topical with tetracaine drops, augmented with 1% preservative-free intracameral lidocaine.   COMPLICATIONS:  None.   DESCRIPTION OF PROCEDURE:  The patient was identified in the holding room and transported to the operating room and placed in the supine position under the operating microscope.  The right eye was identified as the operative eye, which was prepped and draped in the usual sterile ophthalmic fashion.   A 1 millimeter clear-corneal paracentesis was made superotemporally. Preservative-free 1% lidocaine mixed with 1:1,000 bisulfite-free aqueous solution of epinephrine was injected into the anterior chamber. The anterior chamber was then filled with Viscoat viscoelastic. A 2.4 millimeter keratome was used to make a clear-corneal incision inferotemporally. A curvilinear capsulorrhexis was made with a cystotome and capsulorrhexis forceps. Balanced salt solution was used to hydrodissect and hydrodelineate the nucleus. Phacoemulsification was then used to remove the lens nucleus and epinucleus. The remaining cortex was then removed using the irrigation and aspiration handpiece. Provisc was then placed into the capsular bag to distend it for lens placement. A +19.50 D EE intraocular lens was then injected into the capsular  bag. The remaining viscoelastic was aspirated.   Wounds were hydrated with balanced salt solution.  The anterior chamber was inflated to a physiologic pressure with balanced salt solution.  No wound leaks were noted. Moxifloxacin was injected intracamerally.  Timolol and Brimonidine drops were applied to the eye.  The patient was taken to the recovery room in stable condition without complications of anesthesia or surgery.  [employer] 01/08/2023, 1:48 PM

## 2023-01-08 NOTE — Anesthesia Preprocedure Evaluation (Signed)
Anesthesia Evaluation  Patient identified by MRN, date of birth, ID band Patient awake and Patient confused    Reviewed: Allergy & Precautions, NPO status , Patient's Chart, lab work & pertinent test results  History of Anesthesia Complications Negative for: history of anesthetic complications  Airway Mallampati: III  TM Distance: >3 FB Neck ROM: Full    Dental  (+) Teeth Intact   Pulmonary neg pulmonary ROS, neg sleep apnea, neg COPD, Patient abstained from smoking.Not current smoker, former smoker   Pulmonary exam normal breath sounds clear to auscultation       Cardiovascular Exercise Tolerance: Poor METShypertension, Pt. on medications +CHF  (-) CAD and (-) Past MI Normal cardiovascular exam(-) dysrhythmias  Rhythm:Regular     Neuro/Psych  Headaches  negative psych ROS   GI/Hepatic Neg liver ROS, hiatal hernia,GERD  ,,Patient with chronic hiccups for 4 years. Denies any food reflux. Endorses some liquid reflux with his hiccups but denies any reflux today   Endo/Other  negative endocrine ROSneg diabetes    Renal/GU CRFRenal disease  negative genitourinary   Musculoskeletal   Abdominal   Peds negative pediatric ROS (+)  Hematology negative hematology ROS (+)   Anesthesia Other Findings Past Medical History: No date: Hypertension  Past Surgical History: 05/26/2018: ESOPHAGOGASTRODUODENOSCOPY (EGD) WITH PROPOFOL; N/A     Comment:  Procedure: ESOPHAGOGASTRODUODENOSCOPY (EGD) WITH               PROPOFOL;  Surgeon: Pasty Spillers, MD;  Location:               [facility] ENDOSCOPY;  Service: Endoscopy;  Laterality: N/A; 12/28/2020: ESOPHAGOGASTRODUODENOSCOPY (EGD) WITH PROPOFOL; N/A     Comment:  Procedure: ESOPHAGOGASTRODUODENOSCOPY (EGD) WITH               PROPOFOL;  Surgeon: Jaynie Collins, DO;  Location:              [facility] ENDOSCOPY;  Service: Gastroenterology;  Laterality:               N/A; 11/19/2015:  INCISION AND DRAINAGE PERIRECTAL ABSCESS; N/A     Comment:  Procedure: IRRIGATION AND DEBRIDEMENT PERIRECTAL ABSCESS              and debridement of scrotal abscess;  Surgeon: Lattie Haw, MD;  Location: [facility] ORS;  Service: General;                Laterality: N/A; 11/20/2015: INCISION AND DRAINAGE PERIRECTAL ABSCESS; N/A     Comment:  Procedure: IRRIGATION AND DEBRIDEMENT PERIRECTAL ABSCESS              / WITH DRESSING CHANGE;  Surgeon: Lattie Haw, MD;                Location: [facility] ORS;  Service: General;  Laterality: N/A;               peri rectal site 11/21/2015: INCISION AND DRAINAGE PERIRECTAL ABSCESS; N/A     Comment:  Procedure: IRRIGATION AND DEBRIDEMENT PERIRECTAL ABSCESS              WITH DRESSING CHANGE;  Surgeon: Lattie Haw, MD;                Location: [facility] ORS;  Service: General;  Laterality: N/A; 1989: MINOR HEMORRHOIDECTOMY  BMI    Body Mass Index: 30.00 kg/m  Reproductive/Obstetrics negative OB ROS                              Anesthesia Physical Anesthesia Plan  ASA: 3  Anesthesia Plan: MAC   Post-op Pain Management:    Induction: Intravenous  PONV Risk Score and Plan: 1 and Midazolam  Airway Management Planned: Nasal Cannula  Additional Equipment:   Intra-op Plan:   Post-operative Plan:   Informed Consent: I have reviewed the patients History and Physical, chart, labs and discussed the procedure including the risks, benefits and alternatives for the proposed anesthesia with the patient or authorized representative who has indicated his/her understanding and acceptance.       Plan Discussed with: CRNA and Surgeon  Anesthesia Plan Comments: (Explained risks of anesthesia, including PONV, and rare emergencies such as cardiac events, respiratory problems, and allergic reactions, requiring invasive intervention. Discussed the role of CRNA in patient's perioperative care. Patient understands. )          Anesthesia Quick Evaluation

## 2023-01-08 NOTE — Transfer of Care (Signed)
Immediate Anesthesia Transfer of Care Note  Patient: John Booker  Procedure(s) Performed: CATARACT EXTRACTION PHACO AND INTRAOCULAR LENS PLACEMENT (IOC) RIGHT OMIDRIA 14.23 01:16.5 (Right)  Patient Location: PACU  Anesthesia Type: MAC  Level of Consciousness: awake, alert  and patient cooperative  Airway and Oxygen Therapy: Patient Spontanous Breathing and Patient connected to supplemental oxygen  Post-op Assessment: Post-op Vital signs reviewed, Patient's Cardiovascular Status Stable, Respiratory Function Stable, Patent Airway and No signs of Nausea or vomiting  Post-op Vital Signs: Reviewed and stable  Complications: No notable events documented.

## 2023-01-08 NOTE — H&P (Signed)
[facility]   Primary Care Physician:  Marisue Ivan, MD Ophthalmologist: Dr. Deberah Pelton  Pre-Procedure History & Physical: HPI:  John Booker is a 72 y.o. male here for cataract surgery.   Past Medical History:  Diagnosis Date   Chronic hiccups    intermittent   Hypertension    Memory changes     Past Surgical History:  Procedure Laterality Date   CATARACT EXTRACTION W/PHACO Left 11/13/2022   Procedure: CATARACT EXTRACTION PHACO AND INTRAOCULAR LENS PLACEMENT (IOC) LEFT OMIDRIA   13.58  01:23.3;  Surgeon: Estanislado Pandy, MD;  Location: [facility];  Service: Ophthalmology;  Laterality: Left;   ESOPHAGOGASTRODUODENOSCOPY (EGD) WITH PROPOFOL N/A 05/26/2018   Procedure: ESOPHAGOGASTRODUODENOSCOPY (EGD) WITH PROPOFOL;  Surgeon: Pasty Spillers, MD;  Location: [facility] ENDOSCOPY;  Service: Endoscopy;  Laterality: N/A;   ESOPHAGOGASTRODUODENOSCOPY (EGD) WITH PROPOFOL N/A 12/28/2020   Procedure: ESOPHAGOGASTRODUODENOSCOPY (EGD) WITH PROPOFOL;  Surgeon: Jaynie Collins, DO;  Location: [facility] ENDOSCOPY;  Service: Gastroenterology;  Laterality: N/A;   ESOPHAGOGASTRODUODENOSCOPY (EGD) WITH PROPOFOL N/A 10/09/2021   Procedure: ESOPHAGOGASTRODUODENOSCOPY (EGD) WITH PROPOFOL;  Surgeon: Wyline Mood, MD;  Location: [facility] END[facility]COPY;  Service: Gastroenterology;  Laterality: N/A;   INCISION AND DRAINAGE PERIRECTAL ABSCESS N/A 11/19/2015   Procedure: IRRIGATION AND DEBRIDEMENT PERIRECTAL ABSCESS and debridement of scrotal abscess;  Surgeon: Lattie Haw, MD;  Location: [facility] ORS;  Service: General;  Laterality: N/A;   INCISION AND DRAINAGE PERIRECTAL ABSCESS N/A 11/20/2015   Procedure: IRRIGATION AND DEBRIDEMENT PERIRECTAL ABSCESS / WITH DRESSING CHANGE;  Surgeon: Lattie Haw, MD;  Location: [facility] ORS;  Service: General;  Laterality: N/A;  peri rectal site   INCISION AND DRAINAGE PERIRECTAL ABSCESS N/A 11/21/2015   Procedure: IRRIGATION AND DEBRIDEMENT PERIRECTAL  ABSCESS WITH DRESSING CHANGE;  Surgeon: Lattie Haw, MD;  Location: [facility] OR[facility];  Service: General;  Laterality: N/A;   MINOR HEMORRHOIDECTOMY  1989   TEE WITHOUT CARDIOVERSION N/A 11/20/2021   Procedure: TRANSESOPHAGEAL ECHOCARDIOGRAM (TEE);  Surgeon: Lamar Blinks, MD;  Location: [facility] ORS;  Service: Cardiovascular;  Laterality: N/A;  8:00am    Prior to Admission medications   Medication Sig Start Date End Date Taking? Authorizing Provider  chlorproMAZINE (THORAZINE) 25 MG tablet Take 1 tablet (25 mg total) by mouth 3 (three) times daily for 20 days. Take 1 tab 2-3 times daily for intractable hiccups 12/14/22 01/03/23 Yes Sreenath, Sudheer B, MD  losartan (COZAAR) 100 MG tablet Take 1 tablet (100 mg total) by mouth daily. 05/13/21  Yes Karamalegos, Netta Neat, DO  metoprolol succinate (TOPROL-XL) 25 MG 24 hr tablet Take 1 tablet (25 mg total) by mouth daily. 11/27/22  Yes Darlin Priestly, MD  oxymetazoline (NASAL RELIEF) 0.05 % nasal spray Place 1 spray into both nostrils 2 (two) times daily as needed.   Yes [provider]  pantoprazole (PROTONIX) 40 MG tablet TAKE ONE (1) TABLET BY MOUTH TWO TIMES PER DAY 05/23/22  Yes Wyline Mood, MD  potassium chloride SA (KLOR-CON M) 20 MEQ tablet Take 1 tablet (20 mEq total) by mouth daily. 02/19/22  Yes Arnetha Courser, MD  sodium chloride (OCEAN) 0.65 % SOLN nasal spray Place 1 spray into both nostrils as needed for congestion (nose irritation). 02/19/22  Yes Arnetha Courser, MD  metoCLOPramide (REGLAN) 10 MG tablet Take 10 mg by mouth 4 (four) times daily. Patient not taking: Reported on 12/30/2022 12/09/22   [provider]  ondansetron (ZOFRAN) 4 MG tablet Take 1 tablet (4 mg total) by mouth every 8 (  eight) hours as needed for nausea or vomiting. Patient not taking: Reported on 12/30/2022 10/01/22   Wyline Mood, MD    Allergies as of 12/19/2022 - Review Complete 12/13/2022  Allergen Reaction Noted   Adalat [nifedipine]  12/05/2015    Amlodipine Other (See Comments) 07/14/2017    Family History  Problem Relation Age of Onset   Hypertension Father     Social History   Socioeconomic History   Marital status: Legally Separated    Spouse name: Not on file   Number of children: Not on file   Years of education: Not on file   Highest education level: Not on file  Occupational History   Not on file  Tobacco Use   Smoking status: Former    Current packs/day: 0.00    Average packs/day: 0.5 packs/day for 38.0 years (19.0 ttl pk-yrs)    Types: Cigarettes    Start date: 04/10/1984    Quit date: 04/10/2022    Years since quitting: 0.7   Smokeless tobacco: Former  Building services engineer status: Never Used  Substance and Sexual Activity   Alcohol use: Not Currently    Comment: several months,[place]   Drug use: No   Sexual activity: Not Currently  Other Topics Concern   Not on file  Social History Narrative   Not on file   Social Determinants of Health   Financial Resource Strain: Low Risk  (12/09/2022)   Received from [facility]   Overall Financial Resource Strain (CARDIA)    Difficulty of Paying Living Expenses: Not hard at all  Food Insecurity: No Food Insecurity (12/09/2022)   Received from [facility]   Hunger Vital Sign    Worried About Running Out of Food in the Last Year: Never true    Ran Out of Food in the Last Year: Never true  Transportation Needs: No Transportation Needs (12/09/2022)   Received from [facility] - Transportation    In the past 12 months, has lack of transportation kept you from medical appointments or from getting medications?: No    Lack of Transportation (Non-Medical): No  Physical Activity: Not on file  Stress: Not on file  Social Connections: Not on file  Intimate Partner Violence: Not At Risk (11/23/2022)   Humiliation, Afraid, Rape, and Kick questionnaire    Fear of Current or Ex-Partner: No    Emotionally Abused:  No    Physically Abused: No    Sexually Abused: No    Review of Systems: See HPI, otherwise negative ROS  Physical Exam: Ht 6\' 3"  (1.905 m)   Wt 86.2 kg   BMI 23.75 kg/m  General:   Alert, cooperative in NAD Head:  Normocephalic and atraumatic. Respiratory:  Normal work of breathing. Cardiovascular:  RRR  Impression/Plan: John Booker is here for cataract surgery.  Risks, benefits, limitations, and alternatives regarding cataract surgery have been reviewed with the patient.  Questions have been answered.  All parties agreeable.   Estanislado Pandy, MD  01/08/2023, 7:14 AM

## 2023-01-08 NOTE — Anesthesia Postprocedure Evaluation (Signed)
Anesthesia Post Note  Patient: John Booker  Procedure(s) Performed: CATARACT EXTRACTION PHACO AND INTRAOCULAR LENS PLACEMENT (IOC) RIGHT OMIDRIA 14.23 01:16.5 (Right)  Patient location during evaluation: PACU Anesthesia Type: MAC Level of consciousness: awake and alert Pain management: pain level controlled Vital Signs Assessment: post-procedure vital signs reviewed and stable Respiratory status: spontaneous breathing, nonlabored ventilation, respiratory function stable and patient connected to nasal cannula oxygen Cardiovascular status: stable and blood pressure returned to baseline Postop Assessment: no apparent nausea or vomiting Anesthetic complications: no  No notable events documented.   Last Vitals:  Vitals:   01/08/23 1349 01/08/23 1356  BP: 112/67   Pulse: 84 79  Resp: 13 20  Temp: 36.5 C 36.5 C  SpO2: 100% 100%    Last Pain:  Vitals:   01/08/23 1356  TempSrc:   PainSc: 0-No pain                 Stephanie Coup

## 2023-01-09 ENCOUNTER — Encounter: Payer: Self-pay | Admitting: Ophthalmology

## 2023-01-26 ENCOUNTER — Other Ambulatory Visit: Payer: Self-pay | Admitting: Emergency Medicine

## 2023-01-26 DIAGNOSIS — J189 Pneumonia, unspecified organism: Secondary | ICD-10-CM

## 2023-01-26 DIAGNOSIS — R066 Hiccough: Secondary | ICD-10-CM

## 2023-02-02 ENCOUNTER — Other Ambulatory Visit: Payer: Self-pay | Admitting: Pulmonary Disease

## 2023-02-04 ENCOUNTER — Ambulatory Visit: Admission: RE | Admit: 2023-02-04 | Payer: Medicare Other | Source: Ambulatory Visit

## 2023-02-09 ENCOUNTER — Encounter
Admission: RE | Admit: 2023-02-09 | Discharge: 2023-02-09 | Disposition: A | Discharge status: Home / Self Care | Payer: Medicare Other | Source: Ambulatory Visit | Attending: Pulmonary Disease | Admitting: Pulmonary Disease

## 2023-02-09 ENCOUNTER — Other Ambulatory Visit: Payer: Self-pay

## 2023-02-09 HISTORY — DX: Personal history of other diseases of the digestive system: Z87.19

## 2023-02-09 HISTORY — DX: Gastro-esophageal reflux disease without esophagitis: K21.9

## 2023-02-09 HISTORY — DX: Heart failure, unspecified: I50.9

## 2023-02-09 HISTORY — DX: Dyspnea, unspecified: R06.00

## 2023-02-09 NOTE — Patient Instructions (Signed)
Your procedure is scheduled on: Monday 02/16/23 To find out your arrival time, please call [phone] between 1PM - 3PM on:   Friday 02/13/23 Report to the Registration Desk on the 1st floor of the [facility]. Free Valet parking is available.  If your arrival time is 6:00 am, do not arrive before that time as the [facility] entrance doors do not open until 6:00 am.  REMEMBER: Instructions that are not followed completely may result in serious medical risk, up to and including death; or upon the discretion of your surgeon and anesthesiologist your surgery may need to be rescheduled.  Do not eat food or drink any liquids after midnight the night before surgery.  No gum chewing or hard candies.  One week prior to surgery: Stop Anti-inflammatories (NSAIDS) such as Advil, Aleve, Ibuprofen, Motrin, Naproxen, Naprosyn and Aspirin based products such as Excedrin, Goody's Powder, BC Powder. You may however, continue to take Tylenol if needed for pain up until the day of surgery.  Stop ANY OVER THE COUNTER supplements or vitamins until after surgery.  Continue taking all prescribed medications.  TAKE ONLY THESE MEDICATIONS THE MORNING OF SURGERY WITH A SIP OF WATER:  metoprolol succinate (TOPROL-XL) 25 MG 24 hr tablet  pantoprazole (PROTONIX) 40 MG tablet  predniSONE (DELTASONE) 5 MG tablet (if still taking)  No Alcohol for 24 hours before or after surgery.  No Smoking including e-cigarettes for 24 hours before surgery.  No chewable tobacco products for at least 6 hours before surgery.  No nicotine patches on the day of surgery.  Do not use any "recreational" drugs for at least a week (preferably 2 weeks) before your surgery.  Please be advised that the combination of cocaine and anesthesia may have negative outcomes, up to and including death. If you test positive for cocaine, your surgery will be cancelled.  On the morning of surgery brush your teeth with toothpaste and water, you  may rinse your mouth with mouthwash if you wish. Do not swallow any toothpaste or mouthwash.  Use CHG Soap or wipes as directed on instruction sheet. Shower with regular soap the morning of your procedure.  Do not wear lotions, powders, or perfumes.   Do not shave body hair from the neck down 48 hours before surgery.  Wear clean comfortable clothing (specific to your surgery type) to the hospital.  Do not wear jewelry, make-up, hairpins, clips or nail polish.  For welded (permanent) jewelry: bracelets, anklets, waist bands, etc.  Please have this removed prior to surgery.  If it is not removed, there is a chance that hospital personnel will need to cut it off on the day of surgery. Contact lenses, hearing aids and dentures may not be worn into surgery.  Do not bring valuables to the hospital. [facility] is not responsible for any missing/lost belongings or valuables.   Notify your doctor if there is any change in your medical condition (cold, fever, infection).  If you are being discharged the day of surgery, you will not be allowed to drive home. You will need a responsible individual to drive you home and stay with you for 24 hours after surgery.   If you are taking public transportation, you will need to have a responsible individual with you.  If you are being admitted to the hospital overnight, leave your suitcase in the car. After surgery it may be brought to your room.  In case of increased patient census, it may be necessary for you, the  patient, to continue your postoperative care in the Same Day Surgery department.  After surgery, you can help prevent lung complications by doing breathing exercises.  Take deep breaths and cough every 1-2 hours. Your doctor may order a device called an Incentive Spirometer to help you take deep breaths. When coughing or sneezing, hold a pillow firmly against your incision with both hands. This is called "splinting." Doing this helps protect  your incision. It also decreases belly discomfort.  Surgery Visitation Policy:  Patients undergoing a surgery or procedure may have two family members or support persons with them as long as the person is not COVID-19 positive or experiencing its symptoms.   Inpatient Visitation:    Visiting hours are 7 a.m. to 8 p.m. Up to four visitors are allowed at one time in a patient room. The visitors may rotate out with other people during the day. One designated support person (adult) may remain overnight.  Please call the Pre-admissions Testing Dept. at [phone] if you have any questions about these instructions.

## 2023-02-12 ENCOUNTER — Encounter: Payer: Self-pay | Admitting: Urgent Care

## 2023-02-13 ENCOUNTER — Ambulatory Visit
Admission: RE | Admit: 2023-02-13 | Discharge: 2023-02-13 | Disposition: A | Discharge status: Home / Self Care | Payer: Medicare Other | Source: Ambulatory Visit | Attending: Emergency Medicine | Admitting: Emergency Medicine

## 2023-02-13 DIAGNOSIS — R066 Hiccough: Secondary | ICD-10-CM | POA: Insufficient documentation

## 2023-02-13 DIAGNOSIS — J189 Pneumonia, unspecified organism: Secondary | ICD-10-CM | POA: Diagnosis present

## 2023-02-15 MED ORDER — CHLORHEXIDINE GLUCONATE 0.12 % MT SOLN
15.0000 mL | Freq: Once | OROMUCOSAL | Status: AC
  Administered 2023-02-16: 15 mL via OROMUCOSAL

## 2023-02-15 MED ORDER — LACTATED RINGERS IV SOLN: INTRAVENOUS | Status: DC

## 2023-02-15 MED ORDER — ORAL CARE MOUTH RINSE: 15.0000 mL | Freq: Once | OROMUCOSAL | Status: AC

## 2023-02-16 ENCOUNTER — Ambulatory Visit
Admission: RE | Admit: 2023-02-16 | Discharge: 2023-02-16 | Disposition: A | Discharge status: Home / Self Care | Payer: Medicare Other | Attending: Pulmonary Disease | Admitting: Pulmonary Disease

## 2023-02-16 ENCOUNTER — Other Ambulatory Visit: Payer: Self-pay

## 2023-02-16 ENCOUNTER — Encounter: Payer: Self-pay | Admitting: *Deleted

## 2023-02-16 ENCOUNTER — Ambulatory Visit: Payer: Medicare Other

## 2023-02-16 ENCOUNTER — Ambulatory Visit: Payer: Medicare Other | Admitting: Urgent Care

## 2023-02-16 ENCOUNTER — Encounter
Admission: RE | Disposition: A | Discharge status: Home / Self Care | Payer: Self-pay | Source: Home / Self Care | Attending: Pulmonary Disease

## 2023-02-16 DIAGNOSIS — J9809 Other diseases of bronchus, not elsewhere classified: Secondary | ICD-10-CM | POA: Diagnosis not present

## 2023-02-16 DIAGNOSIS — R519 Headache, unspecified: Secondary | ICD-10-CM | POA: Insufficient documentation

## 2023-02-16 DIAGNOSIS — I2510 Atherosclerotic heart disease of native coronary artery without angina pectoris: Secondary | ICD-10-CM | POA: Diagnosis not present

## 2023-02-16 DIAGNOSIS — R600 Localized edema: Secondary | ICD-10-CM | POA: Insufficient documentation

## 2023-02-16 DIAGNOSIS — N400 Enlarged prostate without lower urinary tract symptoms: Secondary | ICD-10-CM | POA: Insufficient documentation

## 2023-02-16 DIAGNOSIS — R066 Hiccough: Secondary | ICD-10-CM | POA: Insufficient documentation

## 2023-02-16 DIAGNOSIS — K219 Gastro-esophageal reflux disease without esophagitis: Secondary | ICD-10-CM | POA: Insufficient documentation

## 2023-02-16 DIAGNOSIS — K449 Diaphragmatic hernia without obstruction or gangrene: Secondary | ICD-10-CM | POA: Diagnosis not present

## 2023-02-16 DIAGNOSIS — I5030 Unspecified diastolic (congestive) heart failure: Secondary | ICD-10-CM | POA: Insufficient documentation

## 2023-02-16 DIAGNOSIS — J398 Other specified diseases of upper respiratory tract: Secondary | ICD-10-CM | POA: Diagnosis not present

## 2023-02-16 DIAGNOSIS — R053 Chronic cough: Secondary | ICD-10-CM | POA: Insufficient documentation

## 2023-02-16 DIAGNOSIS — I130 Hypertensive heart and chronic kidney disease with heart failure and stage 1 through stage 4 chronic kidney disease, or unspecified chronic kidney disease: Secondary | ICD-10-CM | POA: Insufficient documentation

## 2023-02-16 DIAGNOSIS — Z87891 Personal history of nicotine dependence: Secondary | ICD-10-CM | POA: Diagnosis not present

## 2023-02-16 DIAGNOSIS — N183 Chronic kidney disease, stage 3 unspecified: Secondary | ICD-10-CM | POA: Diagnosis not present

## 2023-02-16 HISTORY — DX: Localized swelling, mass and lump, trunk: R22.2

## 2023-02-16 HISTORY — DX: Calculus of gallbladder without cholecystitis without obstruction: K80.20

## 2023-02-16 HISTORY — DX: Bacteremia: R78.81

## 2023-02-16 HISTORY — DX: Atherosclerosis of aorta: I70.0

## 2023-02-16 HISTORY — DX: Diverticulosis of large intestine without perforation or abscess without bleeding: K57.30

## 2023-02-16 HISTORY — DX: Dyskinesia of esophagus: K22.4

## 2023-02-16 HISTORY — DX: Alcohol abuse, in remission: F10.11

## 2023-02-16 HISTORY — DX: Other forms of dyspnea: R06.09

## 2023-02-16 HISTORY — DX: Localized edema: R60.0

## 2023-02-16 HISTORY — DX: Cyst of kidney, acquired: N28.1

## 2023-02-16 HISTORY — DX: Unspecified hemorrhoids: K64.9

## 2023-02-16 HISTORY — DX: Rhabdomyolysis: M62.82

## 2023-02-16 HISTORY — DX: Polyneuropathy, unspecified: G62.9

## 2023-02-16 HISTORY — DX: Metabolic encephalopathy: G93.41

## 2023-02-16 HISTORY — PX: BRONCHIAL WASHINGS: SHX5105

## 2023-02-16 HISTORY — DX: Other specified diseases of liver: K76.89

## 2023-02-16 HISTORY — DX: Unspecified diastolic (congestive) heart failure: I50.30

## 2023-02-16 HISTORY — DX: Other diseases of mediastinum, not elsewhere classified: J98.59

## 2023-02-16 HISTORY — DX: Chronic kidney disease, stage 3 unspecified: N18.30

## 2023-02-16 HISTORY — DX: Calculus of kidney: N20.0

## 2023-02-16 HISTORY — DX: Benign prostatic hyperplasia without lower urinary tract symptoms: N40.0

## 2023-02-16 HISTORY — PX: FLEXIBLE BRONCHOSCOPY: SHX5094

## 2023-02-16 LAB — BODY FLUID CELL COUNT WITH DIFFERENTIAL: Total Nucleated Cell Count, Fluid: 3 uL (ref 0–1000)

## 2023-02-16 SURGERY — IRRIGATION, BRONCHUS
Anesthesia: General

## 2023-02-16 MED ORDER — CHLORHEXIDINE GLUCONATE 0.12 % MT SOLN
OROMUCOSAL | Status: AC
  Filled 2023-02-16: qty 15

## 2023-02-16 MED ORDER — SUCCINYLCHOLINE CHLORIDE 200 MG/10ML IV SOSY
PREFILLED_SYRINGE | INTRAVENOUS | Status: DC | PRN
  Administered 2023-02-16: 120 mg via INTRAVENOUS

## 2023-02-16 MED ORDER — LIDOCAINE HCL (CARDIAC) PF 100 MG/5ML IV SOSY
PREFILLED_SYRINGE | INTRAVENOUS | Status: DC | PRN
  Administered 2023-02-16: 80 mg via INTRAVENOUS

## 2023-02-16 MED ORDER — FENTANYL CITRATE (PF) 100 MCG/2ML IJ SOLN: 25.0000 ug | INTRAMUSCULAR | Status: DC | PRN

## 2023-02-16 MED ORDER — ROCURONIUM BROMIDE 100 MG/10ML IV SOLN
INTRAVENOUS | Status: DC | PRN
  Administered 2023-02-16: 20 mg via INTRAVENOUS

## 2023-02-16 MED ORDER — FENTANYL CITRATE (PF) 100 MCG/2ML IJ SOLN
INTRAMUSCULAR | Status: AC
  Filled 2023-02-16: qty 2

## 2023-02-16 MED ORDER — SUGAMMADEX SODIUM 200 MG/2ML IV SOLN
INTRAVENOUS | Status: DC | PRN
  Administered 2023-02-16: 200 mg via INTRAVENOUS

## 2023-02-16 MED ORDER — OXYCODONE HCL 5 MG PO TABS: 5.0000 mg | ORAL_TABLET | Freq: Once | ORAL | Status: DC | PRN

## 2023-02-16 MED ORDER — ONDANSETRON HCL 4 MG/2ML IJ SOLN
INTRAMUSCULAR | Status: DC | PRN
  Administered 2023-02-16: 4 mg via INTRAVENOUS

## 2023-02-16 MED ORDER — FENTANYL CITRATE (PF) 100 MCG/2ML IJ SOLN
INTRAMUSCULAR | Status: DC | PRN
  Administered 2023-02-16: 100 ug via INTRAVENOUS

## 2023-02-16 MED ORDER — ONDANSETRON HCL 4 MG/2ML IJ SOLN
INTRAMUSCULAR | Status: AC
  Filled 2023-02-16: qty 2

## 2023-02-16 MED ORDER — PROPOFOL 10 MG/ML IV BOLUS
INTRAVENOUS | Status: DC | PRN
  Administered 2023-02-16: 120 mg via INTRAVENOUS

## 2023-02-16 MED ORDER — ROCURONIUM BROMIDE 10 MG/ML (PF) SYRINGE
PREFILLED_SYRINGE | INTRAVENOUS | Status: AC
  Filled 2023-02-16: qty 10

## 2023-02-16 MED ORDER — DEXAMETHASONE SODIUM PHOSPHATE 10 MG/ML IJ SOLN
INTRAMUSCULAR | Status: AC
  Filled 2023-02-16: qty 1

## 2023-02-16 MED ORDER — DEXAMETHASONE SODIUM PHOSPHATE 10 MG/ML IJ SOLN
INTRAMUSCULAR | Status: DC | PRN
  Administered 2023-02-16: 10 mg via INTRAVENOUS

## 2023-02-16 MED ORDER — OXYCODONE HCL 5 MG/5ML PO SOLN: 5.0000 mg | Freq: Once | ORAL | Status: DC | PRN

## 2023-02-16 NOTE — H&P (Signed)
PULMONOLOGY         Date: 02/16/2023,   MRN# [id number] John Booker [birth year]     AdmissionWeight: 91.2 kg                 CurrentWeight: 91.2 kg    CHIEF COMPLAINT:   Chronic cough/hiccup with left lower lobe endobronchial stenosis   HISTORY OF PRESENT ILLNESS   This is a 72 yo male with with history of diastolic CHF, coronary artery disease, previous history of alcohol abuse, history of bacteremia due to gram-negative Klebsiella, chronic bilateral lower extremity edema, BPH, CKD, essential hypertension history of mediastinal mass and development of chronic refractory hiccups which she has used several medications for including Thorazine with failure.  He had CT chest performed recently with findings of saber-sheath trachea as well as stenotic lesion of the left lower lobe and recurrent chronic cough.  Patient reports severe symptoms with frustrating process over the years despite following all instructions from his providers and being fully compliant with therapy.  He is essentially begging for something to be done for his severe hiccups which are affecting his speech and daily activities of living.  Today we plan to do an airway inspection for excessive dynamic airway collapse as well as tracheobronchomalacia and inspection over the left lower lobe stenotic lesion for any endobronchial debris, mucous plugging or cancerous lesions.   PAST MEDICAL HISTORY   Past Medical History:  Diagnosis Date   (HFpEF) heart failure with preserved ejection fraction (HCC)    a.) TTE 04/11/2021: EF 60-65%, no RWMAs, G1DD, mild LAE, RVSF norm, asc Ao 38 mm; b.) TTE 11/18/2021: EF 60-65%, no RWMAs, mild LVH, G1DD, norm RVSH; c.) TEE 11/20/2021: EF 60-65%, no RWMAs, no LAA thrombus, triv MR, no valvular vegetation, no IAS; d.) TTE 11/24/2022: EF 60-65%, no RWMAs, mod LVH, norm RVSF   Alcohol abuse, in remission    Aortic atherosclerosis (HCC)    Bacteremia due to Gram-negative bacteria  (Klebsiella oxytoca)    a.) source unidentified; b.) admitted to [facility] 11/20/2021 - 12/28/2021   Bilateral lower extremity edema    BPH (benign prostatic hyperplasia)    Cholelithiasis    Chronic hiccups    CKD (chronic kidney disease), stage III (HCC)    DOE (dyspnea on exertion)    Esophageal dysmotility    GERD (gastroesophageal reflux disease)    Hemorrhoids    Hepatic cyst    History of bilateral cataract extraction 2024   History of hiatal hernia    Hypertension    Mediastinal mass    Memory changes    Metabolic encephalopathy    a.) in the setting of bacteremia/sepsis   MSSA bacteremia 11/2021   a.) source was foot wounds and probable septic arthritis in the lumbar spine   Nephrolithiasis    Neuropathy    Non-traumatic rhabdomyolysis    Paraspinal mass    Renal cyst, right    Sigmoid diverticulosis      SURGICAL HISTORY   Past Surgical History:  Procedure Laterality Date   CATARACT EXTRACTION W/PHACO Left 11/13/2022   Procedure: CATARACT EXTRACTION PHACO AND INTRAOCULAR LENS PLACEMENT (IOC) LEFT OMIDRIA   13.58  01:23.3;  Surgeon: Estanislado Pandy, MD;  Location: [facility];  Service: Ophthalmology;  Laterality: Left;   CATARACT EXTRACTION W/PHACO Right 01/08/2023   Procedure: CATARACT EXTRACTION PHACO AND INTRAOCULAR LENS PLACEMENT (IOC) RIGHT OMIDRIA 14.23 01:16.5;  Surgeon: Estanislado Pandy, MD;  Location: [facility];  Service:  Ophthalmology;  Laterality: Right;   ESOPHAGOGASTRODUODENOSCOPY (EGD) WITH PROPOFOL N/A 05/26/2018   Procedure: ESOPHAGOGASTRODUODENOSCOPY (EGD) WITH PROPOFOL;  Surgeon: Pasty Spillers, MD;  Location: [facility] END[facility]COPY;  Service: Endoscopy;  Laterality: N/A;   ESOPHAGOGASTRODUODENOSCOPY (EGD) WITH PROPOFOL N/A 12/28/2020   Procedure: ESOPHAGOGASTRODUODENOSCOPY (EGD) WITH PROPOFOL;  Surgeon: Jaynie Collins, DO;  Location: [facility] ENDOSCOPY;  Service: Gastroenterology;  Laterality: N/A;    ESOPHAGOGASTRODUODENOSCOPY (EGD) WITH PROPOFOL N/A 10/09/2021   Procedure: ESOPHAGOGASTRODUODENOSCOPY (EGD) WITH PROPOFOL;  Surgeon: Wyline Mood, MD;  Location: [facility] END[facility];  Service: Gastroenterology;  Laterality: N/A;   INCISION AND DRAINAGE PERIRECTAL ABSCESS N/A 11/19/2015   Procedure: IRRIGATION AND DEBRIDEMENT PERIRECTAL ABSCESS and debridement of scrotal abscess;  Surgeon: Lattie Haw, MD;  Location: [facility] ORS;  Service: General;  Laterality: N/A;   INCISION AND DRAINAGE PERIRECTAL ABSCESS N/A 11/20/2015   Procedure: IRRIGATION AND DEBRIDEMENT PERIRECTAL ABSCESS / WITH DRESSING CHANGE;  Surgeon: Lattie Haw, MD;  Location: [facility];  Service: General;  Laterality: N/A;  peri rectal site   INCISION AND DRAINAGE PERIRECTAL ABSCESS N/A 11/21/2015   Procedure: IRRIGATION AND DEBRIDEMENT PERIRECTAL ABSCESS WITH DRESSING CHANGE;  Surgeon: Lattie Haw, MD;  Location: [facility];  Service: General;  Laterality: N/A;   MINOR HEMORRHOIDECTOMY  1989   TEE WITHOUT CARDIOVERSION N/A 11/20/2021   Procedure: TRANSESOPHAGEAL ECHOCARDIOGRAM (TEE);  Surgeon: Lamar Blinks, MD;  Location: [facility] ORS;  Service: Cardiovascular;  Laterality: N/A;  8:00am     FAMILY HISTORY   Family History  Problem Relation Age of Onset   Hypertension Father      SOCIAL HISTORY   Social History   Tobacco Use   Smoking status: Former    Current packs/day: 0.00    Average packs/day: 0.5 packs/day for 38.0 years (19.0 ttl pk-yrs)    Types: Cigarettes    Start date: 04/10/1984    Quit date: 04/10/2022    Years since quitting: 0.8   Smokeless tobacco: Former  Building services engineer status: Never Used  Substance Use Topics   Alcohol use: Not Currently    Comment: several months,[place]   Drug use: No     MEDICATIONS    Home Medication:    Current Medication:  Current Facility-Administered Medications:    lactated ringers infusion, , Intravenous, Continuous, Corinda Gubler, MD    ALLERGIES    Adalat [nifedipine] and Amlodipine     REVIEW OF SYSTEMS    Review of Systems:  Gen:  Denies  fever, sweats, chills weigh loss  HEENT: Denies blurred vision, double vision, ear pain, eye pain, hearing loss, nose bleeds, sore throat Cardiac:  No dizziness, chest pain or heaviness, chest tightness,edema Resp:   reports dyspnea chronically  Gi: Denies swallowing difficulty, stomach pain, nausea or vomiting, diarrhea, constipation, bowel incontinence Gu:  Denies bladder incontinence, burning urine Ext:   Denies Joint pain, stiffness or swelling Skin: Denies  skin rash, easy bruising or bleeding or hives Endoc:  Denies polyuria, polydipsia , polyphagia or weight change Psych:   Denies depression, insomnia or hallucinations   Other:  All other systems negative   VS: BP (!) 167/89   Pulse 79   Temp 97.6 F (36.4 C) (Temporal)   Resp 18   Ht 6\' 3"  (1.905 m) Comment: pt stated  Wt 91.2 kg   SpO2 99%   BMI 25.12 kg/m      PHYSICAL EXAM    GENERAL:NAD, no fevers, chills, no weakness no fatigue HEAD: Normocephalic, atraumatic.  EYES:  Pupils equal, round, reactive to light. Extraocular muscles intact. No scleral icterus.  MOUTH: Moist mucosal membrane. Dentition intact. No abscess noted.  EAR, NOSE, THROAT: Clear without exudates. No external lesions.  NECK: Supple. No thyromegaly. No nodules. No JVD.  PULMONARY: decreased breath sounds with mild rhonchi worse at bases bilaterally.  CARDIOVASCULAR: S1 and S2. Regular rate and rhythm. No murmurs, rubs, or gallops. No edema. Pedal pulses 2+ bilaterally.  GASTROINTESTINAL: Soft, nontender, nondistended. No masses. Positive bowel sounds. No hepatosplenomegaly.  MUSCULOSKELETAL: No swelling, clubbing, or edema. Range of motion full in all extremities.  NEUROLOGIC: Cranial nerves II through XII are intact. No gross focal neurological deficits. Sensation intact. Reflexes intact.  SKIN: No ulceration, lesions, rashes, or cyanosis.  Skin warm and dry. Turgor intact.  PSYCHIATRIC: Mood, affect within normal limits. The patient is awake, alert and oriented x 3. Insight, judgment intact.       IMAGING   CT chest from 02/13/23 reviewed - severe tracheomalacia, stenosis of left lower lobe lesion with possible endobronchial lesion/mucus plugging.   ASSESSMENT/PLAN   Severe intractable hiccups with tracheobronchomalacia   - additionally LLL stenotic endobronchial lesion   - history of mediastinal mass?    - plan for airway inspection with BAL   - fiberoptic bronchoscopy with therapeutic aspiration of tracheobronchial tree   - may need to use robotic bronchoscopy to reach distal airways as patient has had so much workup and treatment already and is frustrated by low quality of life with daily cough and severe hiccups throughout each day   -Reviewed risks/complications and benefits with patient, risks include infection, pneumothorax/pneumomediastinum which may require chest tube placement as well as overnight/prolonged hospitalization and possible mechanical ventilation. Other risks include bleeding and very rarely death.  Patient understands risks and wishes to proceed.  Additional questions were answered, and patient is aware that post procedure patient will be going home with family and may experience cough with possible clots on expectoration as well as phlegm which may last few days as well as hoarseness of voice post intubation and mechanical ventilation.        Thank you for allowing me to participate in the care of this patient.   Patient/Family are satisfied with care plan and all questions have been answered.    Provider disclosure: Patient with at least one acute or chronic illness or injury that poses a threat to life or bodily function and is being managed actively during this encounter.  All of the below services have been performed independently by signing provider:  review of prior documentation from  internal and or external health records.  Review of previous and current lab results.  Interview and comprehensive assessment during patient visit today. Review of current and previous chest radiographs/CT scans. Discussion of management and test interpretation with health care team and patient/family.   This document was prepared using Dragon voice recognition software and may include unintentional dictation errors.     Vida Rigger, M.D.  Division of Pulmonary & Critical Care Medicine

## 2023-02-16 NOTE — Procedures (Signed)
PROCEDURE: BRONCHOSCOPY Therapeutic Aspiration of Tracheobronchial Tree Multiple lobes  Fiberoptic bronchoscopy with Bronchoalveolar lavage  PROCEDURE DATE: 02/16/2023  TIME:  NAME:  John Booker  DOB:1950-06-03  MRN: [id number] LOC:  [facility]/None    HOSP DAY: @LENGTHOFSTAYDAYS @ CODE STATUS:   Code Status History     Date Active Date Inactive Code Status Order ID Comments User Context   12/13/2022 1740 12/14/2022 1947 Full Code [id number]  CoxNadyne Coombes, DO ED   11/23/2022 0355 11/26/2022 2110 Do not attempt resuscitation (DNR) PRE-ARREST INTERVENTIONS DESIRED [id number]  Andris Baumann, MD ED   02/18/2022 2023 02/19/2022 2055 DNR [id number]  Andris Baumann, MD ED   11/16/2021 0350 11/20/2021 2302 DNR [id number]  Mansy, Vernetta Honey, MD Inpatient   11/15/2021 2332 11/16/2021 0350 Full Code [id number]  Mansy, Vernetta Honey, MD ED   11/15/2021 2309 11/15/2021 2332 Full Code [id number]  Mansy, Vernetta Honey, MD ED   06/26/2021 1351 06/27/2021 1904 Full Code [id number]  Lorretta Harp, MD ED   12/26/2020 1356 12/31/2020 1801 Full Code [id number]  Cox, Amy N, DO ED   03/05/2017 0837 03/10/2017 1659 Full Code [id number]  Arnaldo Natal Inpatient   03/04/2017 1942 03/05/2017 0837 Full Code [id number]  Darci Current, MD ED   03/03/2017 2157 03/04/2017 1749 Full Code [id number]  Willy Eddy, MD ED   11/19/2015 1707 11/23/2015 1517 Full Code [id number]  Lattie Haw, MD Inpatient    Questions for Most Recent Historical Code Status (Order [id number])     Question Answer   By: Default: patient does not have capacity for decision making, no surrogate or prior directive available                Advance Directive Documentation    Flowsheet Row Most Recent Value  Type of Advance Directive Living will  Pre-existing out of facility DNR order (yellow form or pink MOST form) --  "MOST" Form in Place? --           Indications/Preliminary Diagnosis:   Consent: (Place X beside choice/s below)  The benefits, risks and possible  complications of the procedure were        explained to:  __x_ patient  ___ patient's family  ___ other:___________  who verbalized understanding and gave:  ___ verbal  __x_ written  ___ verbal and written  ___ telephone  ___ other:________ consent.      Unable to obtain consent; procedure performed on emergent basis.     Other:       PRESEDATION ASSESSMENT: History and Physical has been performed. Patient meds and allergies have been reviewed. Presedation airway examination has been performed and documented. Baseline vital signs, sedation score, oxygenation status, and cardiac rhythm were reviewed. Patient was deemed to be in satisfactory condition to undergo the procedure.     PROCEDURE DETAILS: Timeout performed and correct patient, name, & ID confirmed. Following prep per Pulmonary policy, appropriate sedation was administered. The Bronchoscope was inserted in to oral cavity with bite block in place. Therapeutic aspiration of Tracheobronchial tree was performed.  Airway exam proceeded with findings, technical procedures, and specimen collection as noted below. At the end of exam the scope was withdrawn without incident. Impression and Plan as noted below.      Procedure was complicated by difficulty with endotracheal intubation.  3 attempts were made to intubate patient with instrumentation of vocal cords.  Anesthesia provider administered steroids IV post complicated intubation process.      Airway Prep (Place X  beside choice below)   1% Transtracheal Lidocaine Anesthetization 7 cc   Patient prepped per Bronchoscopy Lab Policy       Insertion Route (Place X beside choice below)   Nasal   Oral  x Endotracheal Tube   Tracheostomy   INTRAPROCEDURE MEDICATIONS: As per anesthesia                     Medication Amt Dose  Medication Amt Dose  Lidocaine 1%  cc  Epinephrine 1:10,000 sol  cc  Xylocaine 4%  cc  Cocaine  cc   TECHNICAL PROCEDURES: (Place X beside choice  below)   Procedures  Description    None     Electrocautery     Cryotherapy     Balloon Dilatation     Bronchography     Stent Placement   x  Therapeutic Aspiration At left lower lobe and right lower lobe and right middle lobe     Laser/Argon Plasma    Brachytherapy Catheter Placement    Foreign Body Removal         SPECIMENS (Sites): (Place X beside choice below)  Specimens Description   No Specimens Obtained     Washings   x Lavage RML   Biopsies    Fine Needle Aspirates    Brushings    Sputum    FINDINGS:     Media Information  Document Information    Media Information  Document Information  Photos     Media Information  Document Information   Media Information  Document Information   Media Information  Document Information   Media Information  Document Information   Media Information  Document Information       ESTIMATED BLOOD LOSS: none COMPLICATIONS/RESOLUTION: none      IMPRESSION:POST-PROCEDURE DX:    SEVERE CARTILAGENOUS TUMORLETS IN TRACHEA AND CENTRAL AIRWAY AND DIFFUSELY THROUGHOUT BRONCHI BILATERALLY.  THESE FINDINGS ARE CONSISTENT WITH TRACHEOBRONCHOPATHIA OSTEOCHONDROPLASTICA.  ADDITIONALLY SINCE PATIENT HAD ELEVATED CRP REPLASING POLYCHONDRITIS IS ON DIFFERENTIAL.    TRACHEOBRONCHOMALACIA NOTED WITH MODERATE EXCESSIVE DYNAMIC AIRWAY COLLAPSE  MUCUS PLUGGING BILATERALY NOTED AND TREATED WITH THERAPEUTIC ASPIRATION OF TREACHEOBRONCHIAL TREE  BAL WAS PERFORMED AT RML FOR MICROBIOLOGY AND CELL COUNT WITH DIFFERENTIAL   RECOMMENDATION/PLAN:    AWAIT MICROBILOGY CULTURES AUTOIMMUNE WORKUP WILL BE INITIATED  THORACIC SURGERY CONSULTATION FOR TRACHEOBRONCHOMALACIA

## 2023-02-16 NOTE — Transfer of Care (Signed)
Immediate Anesthesia Transfer of Care Note  Patient: John Booker  Procedure(s) Performed: BRONCHIAL WASHINGS FLEXIBLE BRONCHOSCOPY ROBOTIC ASSISTED NAVIGATIONAL BRONCHOSCOPY  Patient Location: PACU  Anesthesia Type:General  Level of Consciousness: awake  Airway & Oxygen Therapy: Patient Spontanous Breathing  Post-op Assessment: Report given to RN and Post -op Vital signs reviewed and stable  Post vital signs: Reviewed and stable  Last Vitals:  Vitals Value Taken Time  BP 143/108 02/16/23 1306  Temp    Pulse 79 02/16/23 1309  Resp 16 02/16/23 1309  SpO2 95 % 02/16/23 1309  Vitals shown include unfiled device data.  Last Pain:  Vitals:   02/16/23 1057  TempSrc: Temporal  PainSc: 0-No pain         Complications:  Encounter Notable Events  Notable Event Outcome Phase Comment  Difficult to intubate - unexpected  Intraprocedure Filed from anesthesia note documentation.

## 2023-02-16 NOTE — Discharge Instructions (Signed)
Follow up in one week. May eat today. May resume meds today

## 2023-02-16 NOTE — Anesthesia Postprocedure Evaluation (Signed)
Anesthesia Post Note  Patient: John Booker  Procedure(s) Performed: BRONCHIAL WASHINGS FLEXIBLE BRONCHOSCOPY ROBOTIC ASSISTED NAVIGATIONAL BRONCHOSCOPY  Patient location during evaluation: PACU Anesthesia Type: General Level of consciousness: awake and alert Pain management: pain level controlled Vital Signs Assessment: post-procedure vital signs reviewed and stable Respiratory status: spontaneous breathing, nonlabored ventilation, respiratory function stable and patient connected to nasal cannula oxygen Cardiovascular status: blood pressure returned to baseline and stable Postop Assessment: no apparent nausea or vomiting Anesthetic complications: yes   Encounter Notable Events  Notable Event Outcome Phase Comment  Difficult to intubate - unexpected  Intraprocedure Filed from anesthesia note documentation.     Last Vitals:  Vitals:   02/16/23 1315 02/16/23 1320  BP: (!) 140/110   Pulse: 73 74  Resp: 17 18  Temp:    SpO2: 93% 95%    Last Pain:  Vitals:   02/16/23 1306  TempSrc:   PainSc: 0-No pain                 Louie Boston

## 2023-02-16 NOTE — Anesthesia Procedure Notes (Addendum)
Procedure Name: Intubation Date/Time: 02/16/2023 12:33 PM  Performed by: Omer Jack, CRNAPre-anesthesia Checklist: Patient identified, Patient being monitored, Timeout performed, Emergency Drugs available and Suction available Patient Re-evaluated:Patient Re-evaluated prior to induction Oxygen Delivery Method: Circle system utilized Preoxygenation: Pre-oxygenation with 100% oxygen Induction Type: IV induction Ventilation: Mask ventilation without difficulty Laryngoscope Size: McGrath and 4 Grade View: Grade I Tube type: Oral Tube size: 8.0 mm Number of attempts: 3 (bougie used) Airway Equipment and Method: Stylet and Bougie stylet Placement Confirmation: ETT inserted through vocal cords under direct vision, positive ETCO2 and breath sounds checked- equal and bilateral Secured at: 21 cm Tube secured with: Tape Dental Injury: Teeth and Oropharynx as per pre-operative assessment  Difficulty Due To: Difficulty was unanticipated and Difficult Airway- due to reduced neck mobility Comments: Small glottic opening. Unable to pass 8.0 ETT with pt in position of comfort. Attempted Bougie without success. Head repositioned with easy pass of 8.0 ETT.

## 2023-02-16 NOTE — Anesthesia Preprocedure Evaluation (Signed)
Anesthesia Evaluation  Patient identified by MRN, date of birth, ID band Patient awake    Reviewed: Allergy & Precautions, NPO status , Patient's Chart, lab work & pertinent test results  History of Anesthesia Complications Negative for: history of anesthetic complications  Airway Mallampati: III  TM Distance: >3 FB Neck ROM: full    Dental no notable dental hx.    Pulmonary neg pulmonary ROS, former smoker   Pulmonary exam normal        Cardiovascular hypertension, On Medications +CHF and + DOE  Normal cardiovascular exam  Per Cardiology 01/2023 72 year old gentleman with a history of hypertension, previous tobacco use, intractable hiccups, and chronic HFpEF. Patient appears euvolemic today. He has been on Lasix for mild peripheral edema. He has intractable hiccups x 5 days, not amendable to medications, and is scheduled for a bronchoscopy. The patient has had no recent CHF exacerbations or hospitalizations, has no history of MI, stents, or stroke. He denies chest pain, worsening dyspnea, syncope, or palpitations. Recent echocardiogram revealed normal LVEF with no significant valvular abnormalities. He is an acceptable surgical candidate, and no further cardiac diagnostics are recommended at this time preoperatively.  Plan   Continue current medications Continue Lasix for peripheral edema Limit salt intake Recommend Mediterranean Diet Recommend weighing daily and monitoring for overnight weight gain of 2 or more pounds or 5 pounds in 1 week Proceed with bronchoscopy Return to clinic for follow up in 6 months.      Neuro/Psych  Headaches  Neuromuscular disease  negative psych ROS   GI/Hepatic Neg liver ROS, hiatal hernia,GERD  ,,  Endo/Other  negative endocrine ROS    Renal/GU Renal disease     Musculoskeletal   Abdominal   Peds  Hematology negative hematology ROS (+)   Anesthesia Other Findings Past Medical  History: No date: (HFpEF) heart failure with preserved ejection fraction (HCC)     Comment:  a.) TTE 04/11/2021: EF 60-65%, no RWMAs, G1DD, mild LAE,              RVSF norm, asc Ao 38 mm; b.) TTE 11/18/2021: EF 60-65%,               no RWMAs, mild LVH, G1DD, norm RVSH; c.) TEE 11/20/2021:               EF 60-65%, no RWMAs, no LAA thrombus, triv MR, no               valvular vegetation, no IAS; d.) TTE 11/24/2022: EF               60-65%, no RWMAs, mod LVH, norm RVSF No date: Alcohol abuse, in remission No date: Aortic atherosclerosis (HCC) No date: Bacteremia due to Gram-negative bacteria (Klebsiella oxytoca)     Comment:  a.) source unidentified; b.) admitted to [facility] 11/20/2021              - 12/28/2021 No date: Bilateral lower extremity edema No date: BPH (benign prostatic hyperplasia) No date: Cholelithiasis No date: Chronic hiccups No date: CKD (chronic kidney disease), stage III (HCC) No date: DOE (dyspnea on exertion) No date: Esophageal dysmotility No date: GERD (gastroesophageal reflux disease) No date: Hemorrhoids No date: Hepatic cyst 2024: History of bilateral cataract extraction No date: History of hiatal hernia No date: Hypertension No date: Mediastinal mass No date: Memory changes No date: Metabolic encephalopathy     Comment:  a.) in the setting of bacteremia/sepsis 11/2021: MSSA bacteremia     Comment:  a.) source was foot wounds and probable septic arthritis              in the lumbar spine No date: Nephrolithiasis No date: Neuropathy No date: Non-traumatic rhabdomyolysis No date: Paraspinal mass No date: Renal cyst, right No date: Sigmoid diverticulosis  Past Surgical History: 11/13/2022: CATARACT EXTRACTION W/PHACO; Left     Comment:  Procedure: CATARACT EXTRACTION PHACO AND INTRAOCULAR               LENS PLACEMENT (IOC) LEFT OMIDRIA   13.58  01:23.3;                Surgeon: Estanislado Pandy, MD;  Location: [facility]               SURGERY CN[facility];   Service: Ophthalmology;  Laterality: Left; 01/08/2023: CATARACT EXTRACTION W/PHACO; Right     Comment:  Procedure: CATARACT EXTRACTION PHACO AND INTRAOCULAR               LENS PLACEMENT (IOC) RIGHT OMIDRIA 14.23 01:16.5;                Surgeon: Estanislado Pandy, MD;  Location: [facility]               SURGERY CNTR;  Service: Ophthalmology;  Laterality:               Right; 05/26/2018: ESOPHAGOGASTRODUODENOSCOPY (EGD) WITH PROPOFOL; N/A     Comment:  Procedure: ESOPHAGOGASTRODUODENOSCOPY (EGD) WITH               PROPOFOL;  Surgeon: Pasty Spillers, MD;  Location:               [facility] ENDOSCOPY;  Service: Endoscopy;  Laterality: N/A; 12/28/2020: ESOPHAGOGASTRODUODENOSCOPY (EGD) WITH PROPOFOL; N/A     Comment:  Procedure: ESOPHAGOGASTRODUODENOSCOPY (EGD) WITH               PROPOFOL;  Surgeon: Jaynie Collins, DO;  Location:              [facility] ENDOSCOPY;  Service: Gastroenterology;  Laterality:               N/A; 10/09/2021: ESOPHAGOGASTRODUODENOSCOPY (EGD) WITH PROPOFOL; N/A     Comment:  Procedure: ESOPHAGOGASTRODUODENOSCOPY (EGD) WITH               PROPOFOL;  Surgeon: Wyline Mood, MD;  Location: [facility]               ENDOSCOPY;  Service: Gastroenterology;  Laterality: N/A; 11/19/2015: INCISION AND DRAINAGE PERIRECTAL ABSCESS; N/A     Comment:  Procedure: IRRIGATION AND DEBRIDEMENT PERIRECTAL ABSCESS              and debridement of scrotal abscess;  Surgeon: Lattie Haw, MD;  Location: [facility] ORS;  Service: General;                Laterality: N/A; 11/20/2015: INCISION AND DRAINAGE PERIRECTAL ABSCESS; N/A     Comment:  Procedure: IRRIGATION AND DEBRIDEMENT PERIRECTAL ABSCESS              / WITH DRESSING CHANGE;  Surgeon: Lattie Haw, MD;                Location: [facility] ORS;  Service: General;  Laterality: N/A;               peri rectal site 11/21/2015: INCISION AND DRAINAGE  PERIRECTAL ABSCESS; N/A     Comment:  Procedure: IRRIGATION AND DEBRIDEMENT PERIRECTAL  ABSCESS              WITH DRESSING CHANGE;  Surgeon: Lattie Haw, MD;                Location: [facility] ORS;  Service: General;  Laterality: N/A; 1989: MINOR HEMORRHOIDECTOMY 11/20/2021: TEE WITHOUT CARDIOVERSION; N/A     Comment:  Procedure: TRANSESOPHAGEAL ECHOCARDIOGRAM (TEE);                Surgeon: Lamar Blinks, MD;  Location: [facility] ORS;                Service: Cardiovascular;  Laterality: N/A;  8:00am  BMI    Body Mass Index: 25.12 kg/m      Reproductive/Obstetrics negative OB ROS                             Anesthesia Physical Anesthesia Plan  ASA: 3  Anesthesia Plan: General ETT   Post-op Pain Management: Ofirmev IV (intra-op)* and Toradol IV (intra-op)*   Induction: Intravenous  PONV Risk Score and Plan: 2 and Ondansetron, Dexamethasone and Treatment may vary due to age or medical condition  Airway Management Planned: Oral ETT  Additional Equipment:   Intra-op Plan:   Post-operative Plan: Extubation in OR  Informed Consent: I have reviewed the patients History and Physical, chart, labs and discussed the procedure including the risks, benefits and alternatives for the proposed anesthesia with the patient or authorized representative who has indicated his/her understanding and acceptance.     Dental Advisory Given  Plan Discussed with: Anesthesiologist, CRNA and Surgeon  Anesthesia Plan Comments: (Patient consented for risks of anesthesia including but not limited to:  - adverse reactions to medications - damage to eyes, teeth, lips or other oral mucosa - nerve damage due to positioning  - sore throat or hoarseness - Damage to heart, brain, nerves, lungs, other parts of body or loss of life  Patient voiced understanding and assent.)       Anesthesia Quick Evaluation

## 2023-02-17 ENCOUNTER — Encounter: Payer: Self-pay | Admitting: Pulmonary Disease

## 2023-02-18 LAB — ACID FAST SMEAR (AFB, MYCOBACTERIA): Acid Fast Smear: NEGATIVE

## 2023-02-19 LAB — CULTURE, BAL-QUANTITATIVE W GRAM STAIN

## 2023-02-25 LAB — VIRUS CULTURE

## 2023-04-01 LAB — ACID FAST CULTURE WITH REFLEXED SENSITIVITIES (MYCOBACTERIA): Acid Fast Culture: NEGATIVE

## 2023-07-06 ENCOUNTER — Ambulatory Visit: Admitting: Surgery

## 2023-07-10 ENCOUNTER — Other Ambulatory Visit: Payer: Self-pay

## 2023-07-10 ENCOUNTER — Inpatient Hospital Stay
Admission: EM | Admit: 2023-07-10 | Discharge: 2023-07-15 | DRG: 683 | Disposition: A | Discharge status: Skilled Nursing Facility | Attending: Internal Medicine | Admitting: Internal Medicine

## 2023-07-10 ENCOUNTER — Emergency Department

## 2023-07-10 DIAGNOSIS — R296 Repeated falls: Secondary | ICD-10-CM | POA: Diagnosis present

## 2023-07-10 DIAGNOSIS — K220 Achalasia of cardia: Principal | ICD-10-CM

## 2023-07-10 DIAGNOSIS — Z79899 Other long term (current) drug therapy: Secondary | ICD-10-CM

## 2023-07-10 DIAGNOSIS — M4324 Fusion of spine, thoracic region: Secondary | ICD-10-CM | POA: Diagnosis present

## 2023-07-10 DIAGNOSIS — D72819 Decreased white blood cell count, unspecified: Secondary | ICD-10-CM | POA: Diagnosis present

## 2023-07-10 DIAGNOSIS — K2289 Other specified disease of esophagus: Secondary | ICD-10-CM | POA: Diagnosis present

## 2023-07-10 DIAGNOSIS — R7401 Elevation of levels of liver transaminase levels: Secondary | ICD-10-CM | POA: Diagnosis present

## 2023-07-10 DIAGNOSIS — I10 Essential (primary) hypertension: Secondary | ICD-10-CM | POA: Diagnosis present

## 2023-07-10 DIAGNOSIS — K224 Dyskinesia of esophagus: Secondary | ICD-10-CM | POA: Diagnosis present

## 2023-07-10 DIAGNOSIS — E860 Dehydration: Secondary | ICD-10-CM | POA: Diagnosis not present

## 2023-07-10 DIAGNOSIS — M4802 Spinal stenosis, cervical region: Secondary | ICD-10-CM | POA: Diagnosis present

## 2023-07-10 DIAGNOSIS — R531 Weakness: Secondary | ICD-10-CM

## 2023-07-10 DIAGNOSIS — F1021 Alcohol dependence, in remission: Secondary | ICD-10-CM

## 2023-07-10 DIAGNOSIS — K529 Noninfective gastroenteritis and colitis, unspecified: Secondary | ICD-10-CM | POA: Diagnosis present

## 2023-07-10 DIAGNOSIS — D6959 Other secondary thrombocytopenia: Secondary | ICD-10-CM | POA: Diagnosis present

## 2023-07-10 DIAGNOSIS — I110 Hypertensive heart disease with heart failure: Secondary | ICD-10-CM | POA: Diagnosis present

## 2023-07-10 DIAGNOSIS — I5032 Chronic diastolic (congestive) heart failure: Secondary | ICD-10-CM | POA: Diagnosis present

## 2023-07-10 DIAGNOSIS — K222 Esophageal obstruction: Secondary | ICD-10-CM | POA: Diagnosis present

## 2023-07-10 DIAGNOSIS — I471 Supraventricular tachycardia, unspecified: Principal | ICD-10-CM | POA: Diagnosis present

## 2023-07-10 DIAGNOSIS — F32A Depression, unspecified: Secondary | ICD-10-CM | POA: Diagnosis present

## 2023-07-10 DIAGNOSIS — E876 Hypokalemia: Secondary | ICD-10-CM | POA: Diagnosis present

## 2023-07-10 DIAGNOSIS — Z79890 Hormone replacement therapy (postmenopausal): Secondary | ICD-10-CM

## 2023-07-10 DIAGNOSIS — M4322 Fusion of spine, cervical region: Secondary | ICD-10-CM | POA: Diagnosis present

## 2023-07-10 DIAGNOSIS — K3189 Other diseases of stomach and duodenum: Secondary | ICD-10-CM | POA: Diagnosis present

## 2023-07-10 DIAGNOSIS — S91301A Unspecified open wound, right foot, initial encounter: Secondary | ICD-10-CM | POA: Diagnosis present

## 2023-07-10 DIAGNOSIS — W19XXXA Unspecified fall, initial encounter: Secondary | ICD-10-CM | POA: Diagnosis present

## 2023-07-10 DIAGNOSIS — D696 Thrombocytopenia, unspecified: Secondary | ICD-10-CM | POA: Diagnosis present

## 2023-07-10 DIAGNOSIS — M488X2 Other specified spondylopathies, cervical region: Secondary | ICD-10-CM | POA: Diagnosis present

## 2023-07-10 DIAGNOSIS — Z9842 Cataract extraction status, left eye: Secondary | ICD-10-CM

## 2023-07-10 DIAGNOSIS — S91302A Unspecified open wound, left foot, initial encounter: Secondary | ICD-10-CM | POA: Diagnosis present

## 2023-07-10 DIAGNOSIS — E871 Hypo-osmolality and hyponatremia: Secondary | ICD-10-CM | POA: Diagnosis present

## 2023-07-10 DIAGNOSIS — F1011 Alcohol abuse, in remission: Secondary | ICD-10-CM | POA: Diagnosis present

## 2023-07-10 DIAGNOSIS — Z8249 Family history of ischemic heart disease and other diseases of the circulatory system: Secondary | ICD-10-CM

## 2023-07-10 DIAGNOSIS — E039 Hypothyroidism, unspecified: Secondary | ICD-10-CM | POA: Diagnosis present

## 2023-07-10 DIAGNOSIS — Z87891 Personal history of nicotine dependence: Secondary | ICD-10-CM

## 2023-07-10 DIAGNOSIS — Z23 Encounter for immunization: Secondary | ICD-10-CM

## 2023-07-10 DIAGNOSIS — N179 Acute kidney failure, unspecified: Principal | ICD-10-CM

## 2023-07-10 DIAGNOSIS — R627 Adult failure to thrive: Secondary | ICD-10-CM | POA: Diagnosis present

## 2023-07-10 DIAGNOSIS — Z66 Do not resuscitate: Secondary | ICD-10-CM

## 2023-07-10 DIAGNOSIS — N400 Enlarged prostate without lower urinary tract symptoms: Secondary | ICD-10-CM | POA: Diagnosis present

## 2023-07-10 DIAGNOSIS — Z87442 Personal history of urinary calculi: Secondary | ICD-10-CM

## 2023-07-10 DIAGNOSIS — K219 Gastro-esophageal reflux disease without esophagitis: Secondary | ICD-10-CM | POA: Diagnosis present

## 2023-07-10 DIAGNOSIS — Z888 Allergy status to other drugs, medicaments and biological substances status: Secondary | ICD-10-CM

## 2023-07-10 DIAGNOSIS — M7989 Other specified soft tissue disorders: Secondary | ICD-10-CM | POA: Diagnosis present

## 2023-07-10 DIAGNOSIS — Z9841 Cataract extraction status, right eye: Secondary | ICD-10-CM

## 2023-07-10 LAB — URINALYSIS, ROUTINE W REFLEX MICROSCOPIC
Bacteria, UA: NONE SEEN
Bilirubin Urine: NEGATIVE
Glucose, UA: 50 mg/dL — AB
Ketones, ur: 20 mg/dL — AB
Leukocytes,Ua: NEGATIVE
Nitrite: NEGATIVE
Protein, ur: 100 mg/dL — AB
Specific Gravity, Urine: 1.02 (ref 1.005–1.030)
pH: 6 (ref 5.0–8.0)

## 2023-07-10 LAB — CBC WITH DIFFERENTIAL/PLATELET
Abs Immature Granulocytes: 0.24 10*3/uL — ABNORMAL HIGH (ref 0.00–0.07)
Basophils Absolute: 0 10*3/uL (ref 0.0–0.1)
Basophils Relative: 0 %
Eosinophils Absolute: 0 10*3/uL (ref 0.0–0.5)
Eosinophils Relative: 0 %
HCT: 44.1 % (ref 39.0–52.0)
Hemoglobin: 15.1 g/dL (ref 13.0–17.0)
Immature Granulocytes: 2 %
Lymphocytes Relative: 8 %
Lymphs Abs: 0.8 10*3/uL (ref 0.7–4.0)
MCH: 33 pg (ref 26.0–34.0)
MCHC: 34.2 g/dL (ref 30.0–36.0)
MCV: 96.3 fL (ref 80.0–100.0)
Monocytes Absolute: 0.9 10*3/uL (ref 0.1–1.0)
Monocytes Relative: 9 %
Neutro Abs: 8 10*3/uL — ABNORMAL HIGH (ref 1.7–7.7)
Neutrophils Relative %: 81 %
Platelets: 177 10*3/uL (ref 150–400)
RBC: 4.58 MIL/uL (ref 4.22–5.81)
RDW: 12.6 % (ref 11.5–15.5)
WBC: 9.9 10*3/uL (ref 4.0–10.5)
nRBC: 0 % (ref 0.0–0.2)

## 2023-07-10 LAB — COMPREHENSIVE METABOLIC PANEL WITH GFR
ALT: 48 U/L — ABNORMAL HIGH (ref 0–44)
AST: 51 U/L — ABNORMAL HIGH (ref 15–41)
Albumin: 3.9 g/dL (ref 3.5–5.0)
Alkaline Phosphatase: 83 U/L (ref 38–126)
Anion gap: 20 — ABNORMAL HIGH (ref 5–15)
BUN: 34 mg/dL — ABNORMAL HIGH (ref 8–23)
CO2: 23 mmol/L (ref 22–32)
Calcium: 9.2 mg/dL (ref 8.9–10.3)
Chloride: 88 mmol/L — ABNORMAL LOW (ref 98–111)
Creatinine, Ser: 1.28 mg/dL — ABNORMAL HIGH (ref 0.61–1.24)
GFR, Estimated: 59 mL/min — ABNORMAL LOW (ref >60)
Glucose, Bld: 157 mg/dL — ABNORMAL HIGH (ref 70–99)
Potassium: 3.2 mmol/L — ABNORMAL LOW (ref 3.5–5.1)
Sodium: 131 mmol/L — ABNORMAL LOW (ref 135–145)
Total Bilirubin: 3.6 mg/dL — ABNORMAL HIGH (ref 0.0–1.2)
Total Protein: 6.6 g/dL (ref 6.5–8.1)

## 2023-07-10 LAB — CK: Total CK: 155 U/L (ref 49–397)

## 2023-07-10 LAB — MAGNESIUM: Magnesium: 1.7 mg/dL (ref 1.7–2.4)

## 2023-07-10 LAB — T4, FREE: Free T4: 1.15 ng/dL — ABNORMAL HIGH (ref 0.61–1.12)

## 2023-07-10 LAB — TROPONIN I (HIGH SENSITIVITY)
Troponin I (High Sensitivity): 18 ng/L — ABNORMAL HIGH (ref <18)
Troponin I (High Sensitivity): 17 ng/L (ref <18)

## 2023-07-10 LAB — TSH: TSH: 4.64 u[IU]/mL — ABNORMAL HIGH (ref 0.350–4.500)

## 2023-07-10 LAB — BRAIN NATRIURETIC PEPTIDE: B Natriuretic Peptide: 541.5 pg/mL — ABNORMAL HIGH (ref 0.0–100.0)

## 2023-07-10 LAB — BETA-HYDROXYBUTYRIC ACID: Beta-Hydroxybutyric Acid: 3.96 mmol/L — ABNORMAL HIGH (ref 0.05–0.27)

## 2023-07-10 MED ORDER — PANTOPRAZOLE SODIUM 40 MG PO TBEC
40.0000 mg | DELAYED_RELEASE_TABLET | Freq: Two times a day (BID) | ORAL | Status: DC
  Administered 2023-07-10 – 2023-07-15 (×10): 40 mg via ORAL
  Filled 2023-07-10 (×10): qty 1

## 2023-07-10 MED ORDER — METOPROLOL TARTRATE 5 MG/5ML IV SOLN: 5.0000 mg | INTRAVENOUS | Status: DC | PRN

## 2023-07-10 MED ORDER — POTASSIUM CHLORIDE 10 MEQ/100ML IV SOLN
10.0000 meq | INTRAVENOUS | Status: AC
  Administered 2023-07-10 (×3): 10 meq via INTRAVENOUS
  Filled 2023-07-10 (×2): qty 100

## 2023-07-10 MED ORDER — NICOTINE 21 MG/24HR TD PT24
21.0000 mg | MEDICATED_PATCH | Freq: Every day | TRANSDERMAL | Status: DC | PRN
  Filled 2023-07-10: qty 1

## 2023-07-10 MED ORDER — DILTIAZEM HCL 25 MG/5ML IV SOLN
10.0000 mg | Freq: Once | INTRAVENOUS | Status: AC
  Administered 2023-07-10: 10 mg via INTRAVENOUS
  Filled 2023-07-10: qty 5

## 2023-07-10 MED ORDER — METOPROLOL SUCCINATE ER 25 MG PO TB24
25.0000 mg | ORAL_TABLET | Freq: Every day | ORAL | Status: DC
  Administered 2023-07-10 – 2023-07-15 (×5): 25 mg via ORAL
  Filled 2023-07-10 (×6): qty 1

## 2023-07-10 MED ORDER — HEPARIN SODIUM (PORCINE) 5000 UNIT/ML IJ SOLN
5000.0000 [IU] | Freq: Three times a day (TID) | INTRAMUSCULAR | Status: DC
  Administered 2023-07-10 – 2023-07-15 (×14): 5000 [IU] via SUBCUTANEOUS
  Filled 2023-07-10 (×14): qty 1

## 2023-07-10 MED ORDER — LACTATED RINGERS IV SOLN: INTRAVENOUS | Status: DC

## 2023-07-10 MED ORDER — SODIUM CHLORIDE 0.9 % IV BOLUS: 500.0000 mL | Freq: Once | INTRAVENOUS | Status: DC

## 2023-07-10 MED ORDER — ONDANSETRON HCL 4 MG/2ML IJ SOLN
4.0000 mg | Freq: Four times a day (QID) | INTRAMUSCULAR | Status: DC | PRN
  Filled 2023-07-10: qty 2

## 2023-07-10 MED ORDER — ACETAMINOPHEN 325 MG PO TABS
650.0000 mg | ORAL_TABLET | Freq: Four times a day (QID) | ORAL | Status: DC | PRN
  Administered 2023-07-10 – 2023-07-15 (×2): 650 mg via ORAL
  Filled 2023-07-10 (×2): qty 2

## 2023-07-10 MED ORDER — SENNOSIDES-DOCUSATE SODIUM 8.6-50 MG PO TABS: 1.0000 | ORAL_TABLET | Freq: Every evening | ORAL | Status: DC | PRN

## 2023-07-10 MED ORDER — SODIUM CHLORIDE 0.9 % IV BOLUS
250.0000 mL | Freq: Once | INTRAVENOUS | Status: AC
  Administered 2023-07-10: 250 mL via INTRAVENOUS

## 2023-07-10 MED ORDER — ONDANSETRON HCL 4 MG PO TABS
4.0000 mg | ORAL_TABLET | Freq: Four times a day (QID) | ORAL | Status: DC | PRN
Start: 2023-07-10 — End: 2023-07-15

## 2023-07-10 MED ORDER — HYDRALAZINE HCL 20 MG/ML IJ SOLN: 5.0000 mg | Freq: Four times a day (QID) | INTRAMUSCULAR | Status: DC | PRN

## 2023-07-10 MED ORDER — CHLORPROMAZINE HCL 10 MG PO TABS: 10.0000 mg | ORAL_TABLET | Freq: Two times a day (BID) | ORAL | Status: DC | PRN

## 2023-07-10 MED ORDER — ACETAMINOPHEN 650 MG RE SUPP: 650.0000 mg | Freq: Four times a day (QID) | RECTAL | Status: DC | PRN

## 2023-07-10 MED ORDER — SODIUM CHLORIDE 0.9 % IV SOLN: INTRAVENOUS | Status: DC

## 2023-07-10 NOTE — Assessment & Plan Note (Signed)
 Status post sodium chloride  250 mL IV per EDP Continue with sodium chloride  infusion at 100 mL/h, 1 day ordered

## 2023-07-10 NOTE — ED Notes (Signed)
 Pt sitting up eating dinner

## 2023-07-10 NOTE — Hospital Course (Signed)
 Mr. John Booker is a 73 year old male with history of hypertension, CKD stage II/IIIa, presents to the emergency department for chief concerns of weakness and dizziness and falling 4 days ago. Per patient daughter, patient has some dementia, also drink large amount alcohol daily.  Has been falling, but never had a syncope. He has worsening diarrhea for the last month, to the point he was incontinent of stool.  He also has severe hiccups for the past year, which has prevented him from eating. Before admission to the hospital, he was very dizzy. Upon arriving hospital, patient had a significant electrolyte abnormality with AKI.  Received IV fluids in the emergency room.

## 2023-07-10 NOTE — Assessment & Plan Note (Signed)
 Suspect secondary to poor p.o. intake Lactated ringer  125 mL/h, 1 day ordered

## 2023-07-10 NOTE — H&P (Addendum)
 History and Physical   John Booker YQM:[id number] DOB: [birth year] DOA: 07/10/2023  PCP: Monique Ano, MD  Patient coming from: home via EMS  I have personally briefly reviewed patient's old medical records in [facility] EMR.  Chief Concern: Weakness, falling  HPI: Mr. John Booker is a 73 year old male with history of hypertension, CKD stage II/IIIa, presents to the emergency department for chief concerns of weakness and dizziness and falling 4 days ago.  Vitals in the ED showed temperature of 98.1, respiration rate 19, heart rate 91, blood pressure 146/97, SpO2 100% on room air.  Serum sodium is 131, potassium 3.2, chloride 88, bicarb 22, BUN of 34, serum creatinine 1.28, EGFR 59, nonfasting blood glucose 159, WBC 9.9, hemoglobin 15.1, platelets 177.  High sensitive troponin was 18 and on repeat was 17.  CK was 155.  UA was negative for leukocytes and nitrates.  Beta hydroxybutyrate was elevated at 396.  BNP was elevated at 541.5.  ED treatment: Sodium chloride  250 mL bolus, and diltiazem  10 mg IV one-time dose. -------------------------------------- At bedside, he is able to tell me his name, age, location, current calendar year.  He fell today and does not remember how he fell.  He reports he may have lost consiciousness after falling. He is not sure.   He states that it was difficult for him to finish dinner because he cannot eat while laying down.  He reports he has baseline swelling of his lower legs for so long he cannot remember when it started.  Social history: He lives alone. He denies tobacco, etoh, andr ecreational drug use. He is retired and formerly worked for [employer].   ROS: Constitutional: no weight change, no fever ENT/Mouth: no sore throat, no rhinorrhea Eyes: no eye pain, no vision changes Cardiovascular: no chest pain, no dyspnea,  no edema, no palpitations Respiratory: no cough, no sputum, no wheezing Gastrointestinal: no nausea, no vomiting,  no diarrhea, no constipation Genitourinary: no urinary incontinence, no dysuria, no hematuria Musculoskeletal: no arthralgias, no myalgias Skin: no skin lesions, no pruritus, Neuro: + weakness, no loss of consciousness, no syncope Psych: no anxiety, no depression, + decrease appetite Heme/Lymph: no bruising, no bleeding  ED Course: Discussed with EDP, patient requiring hospitalization for chief concerns of weakness, falling, and acute kidney injury.  Assessment/Plan  Principal Problem:   AKI (acute kidney injury) (HCC) Active Problems:   Hyponatremia   Essential hypertension   Generalized weakness   Hypokalemia   GERD without esophagitis   Alcohol dependence in sustained full remission (HCC)   Assessment and Plan:  * AKI (acute kidney injury) (HCC) Suspect secondary to poor p.o. intake Lactated ringer  125 mL/h, 1 day ordered  Hyponatremia Status post sodium chloride  250 mL IV per EDP Continue with sodium chloride  infusion at 100 mL/h, 1 day ordered  Essential hypertension Home metoprolol  succinate 25 mg daily resumed Hydralazine  5 mg IV every 6 hours.  For SBP greater 165, 5 days ordered  Generalized weakness With increased falls at home PT, OT, TOC Fall precautions  Hypokalemia Potassium chloride  10 mEq IV, 3 doses ordered  Chart reviewed.   Echo 11/24/2022: Estimated ejection fraction 60 to 65%.  DVT prophylaxis: Heparin  5000 units subcutaneous every 8 hours Code Status: DNR/DNI; confirmed with patient at bedside Diet: Heart healthy Family Communication: A phone call was offered; patient declined stating that he will let his daughter know Disposition Plan: Pending clinical course Consults called: None at this time Admission status: Telemetry medical, inpatient  Past  Medical History:  Diagnosis Date   (HFpEF) heart failure with preserved ejection fraction (HCC)    a.) TTE 04/11/2021: EF 60-65%, no RWMAs, G1DD, mild LAE, RVSF norm, asc Ao 38 mm; b.) TTE  11/18/2021: EF 60-65%, no RWMAs, mild LVH, G1DD, norm RVSH; c.) TEE 11/20/2021: EF 60-65%, no RWMAs, no LAA thrombus, triv MR, no valvular vegetation, no IAS; d.) TTE 11/24/2022: EF 60-65%, no RWMAs, mod LVH, norm RVSF   Alcohol abuse, in remission    Aortic atherosclerosis (HCC)    Bacteremia due to Gram-negative bacteria (Klebsiella oxytoca)    a.) source unidentified; b.) admitted to [facility] 11/20/2021 - 12/28/2021   Bilateral lower extremity edema    BPH (benign prostatic hyperplasia)    Cholelithiasis    Chronic hiccups    CKD (chronic kidney disease), stage III (HCC)    DOE (dyspnea on exertion)    Esophageal dysmotility    GERD (gastroesophageal reflux disease)    Hemorrhoids    Hepatic cyst    History of bilateral cataract extraction 2024   History of hiatal hernia    Hypertension    Mediastinal mass    Memory changes    Metabolic encephalopathy    a.) in the setting of bacteremia/sepsis   MSSA bacteremia 11/2021   a.) source was foot wounds and probable septic arthritis in the lumbar spine   Nephrolithiasis    Neuropathy    Non-traumatic rhabdomyolysis    Paraspinal mass    Renal cyst, right    Sigmoid diverticulosis    Past Surgical History:  Procedure Laterality Date   BRONCHIAL WASHINGS N/A 02/16/2023   Procedure: BRONCHIAL WASHINGS;  Surgeon: Erskin Hearing, MD;  Location: [facility] ORS;  Service: Thoracic;  Laterality: N/A;   CATARACT EXTRACTION W/PHACO Left 11/13/2022   Procedure: CATARACT EXTRACTION PHACO AND INTRAOCULAR LENS PLACEMENT (IOC) LEFT OMIDRIA    13.58  01:23.3;  Surgeon: Trudi Fus, MD;  Location: [facility];  Service: Ophthalmology;  Laterality: Left;   CATARACT EXTRACTION W/PHACO Right 01/08/2023   Procedure: CATARACT EXTRACTION PHACO AND INTRAOCULAR LENS PLACEMENT (IOC) RIGHT OMIDRIA  14.23 01:16.5;  Surgeon: Trudi Fus, MD;  Location: [facility];  Service: Ophthalmology;  Laterality: Right;    ESOPHAGOGASTRODUODENOSCOPY (EGD) WITH PROPOFOL  N/A 05/26/2018   Procedure: ESOPHAGOGASTRODUODENOSCOPY (EGD) WITH PROPOFOL ;  Surgeon: Irby Mannan, MD;  Location: [facility] ENDOSCOPY;  Service: Endoscopy;  Laterality: N/A;   ESOPHAGOGASTRODUODENOSCOPY (EGD) WITH PROPOFOL  N/A 12/28/2020   Procedure: ESOPHAGOGASTRODUODENOSCOPY (EGD) WITH PROPOFOL ;  Surgeon: Quintin Buckle, DO;  Location[facility] END[facility]COPY;  Service: Gastroenterology;  Laterality: N/A;   ESOPHAGOGASTRODUODENOSCOPY (EGD) WITH PROPOFOL  N/A 10/09/2021   Procedure: ESOPHAGOGASTRODUODENOSCOPY (EGD) WITH PROPOFOL ;  Surgeon: Luke Salaam, MD;  Location: [facility] ENDOSCOPY;  Service: Gastroenterology;  Laterality: N/A;   FLEXIBLE BRONCHOSCOPY N/A 02/16/2023   Procedure: FLEXIBLE BRONCHOSCOPY;  Surgeon: Erskin Hearing, MD;  Location: [facility] ORS;  Service: Thoracic;  Laterality: N/A;   INCISION AND DRAINAGE PERIRECTAL ABSCESS N/A 11/19/2015   Procedure: IRRIGATION AND DEBRIDEMENT PERIRECTAL ABSCESS and debridement of scrotal abscess;  Surgeon: Claudia Cuff, MD;  Location: [facility] ORS;  Service: General;  Laterality: N/A;   INCISION AND DRAINAGE PERIRECTAL ABSCESS N/A 11/20/2015   Procedure: IRRIGATION AND DEBRIDEMENT PERIRECTAL ABSCESS / WITH DRESSING CHANGE;  Surgeon: Claudia Cuff, MD;  Location: [facility] ORS;  Service: General;  Laterality: N/A;  peri rectal site   INCISION AND DRAINAGE PERIRECTAL ABSCESS N/A 11/21/2015   Procedure: IRRIGATION AND DEBRIDEMENT PERIRECTAL ABSCESS WITH DRESSING CHANGE;  Surgeon: Sophie Dutch  Arlester Ladd, MD;  Location: [facility] ORS;  Service: General;  Laterality: N/A;   MINOR HEMORRHOIDECTOMY  1989   TEE WITHOUT CARDIOVERSION N/A 11/20/2021   Procedure: TRANSESOPHAGEAL ECHOCARDIOGRAM (TEE);  Surgeon: Michelle Aid, MD;  Location: [facility] ORS;  Service: Cardiovascular;  Laterality: N/A;  8:00am   Social History:  reports that he quit smoking about 15 months ago. His smoking use included cigarettes. He started smoking about 39  years ago. He has a 19 pack-year smoking history. He has quit using smokeless tobacco. He reports that he does not currently use alcohol. He reports that he does not use drugs.  Allergies  Allergen Reactions   Adalat [Nifedipine]     Foot swelling.    Amlodipine  Other (See Comments)    Leg swelling   Family History  Problem Relation Age of Onset   Hypertension Father    Family history: Family history reviewed and not pertinent.  Prior to Admission medications   Medication Sig Start Date End Date Taking? Authorizing Provider  chlorproMAZINE  (THORAZINE ) 10 MG tablet Take 10 mg by mouth 2 (two) times daily as needed for hiccoughs.    [provider]  chlorproMAZINE  (THORAZINE ) 25 MG tablet Take 1 tablet (25 mg total) by mouth 3 (three) times daily for 20 days. Take 1 tab 2-3 times daily for intractable hiccups 12/14/22 01/03/23  Tiajuana Fluke, MD  losartan  (COZAAR ) 100 MG tablet Take 1 tablet (100 mg total) by mouth daily. 05/13/21   Karamalegos, Kayleen Party, DO  metoCLOPramide  (REGLAN ) 10 MG tablet Take 10 mg by mouth 4 (four) times daily. Patient not taking: Reported on 12/30/2022 12/09/22   [provider]  metoprolol  succinate (TOPROL -XL) 25 MG 24 hr tablet Take 1 tablet (25 mg total) by mouth daily. 11/27/22   Garrison Kanner, MD  ondansetron  (ZOFRAN ) 4 MG tablet Take 1 tablet (4 mg total) by mouth every 8 (eight) hours as needed for nausea or vomiting. 10/01/22   Luke Salaam, MD  oxymetazoline  (NASAL RELIEF) 0.05 % nasal spray Place 1 spray into both nostrils 2 (two) times daily as needed.    [provider]  pantoprazole  (PROTONIX ) 40 MG tablet TAKE ONE (1) TABLET BY MOUTH TWO TIMES PER DAY 05/23/22   Luke Salaam, MD  potassium chloride  SA (KLOR-CON  M) 20 MEQ tablet Take 1 tablet (20 mEq total) by mouth daily. 02/19/22   Amin, Sumayya, MD  predniSONE (DELTASONE) 5 MG tablet Take 5 mg by mouth daily with breakfast.    [provider]  sodium chloride   (OCEAN) 0.65 % SOLN nasal spray Place 1 spray into both nostrils as needed for congestion (nose irritation). Patient not taking: Reported on 02/16/2023 02/19/22   Luna Salinas, MD   Physical Exam: Vitals:   07/10/23 1825 07/10/23 1845 07/10/23 1851 07/10/23 1922  BP:  129/72 129/72 137/63  Pulse:   93 87  Resp:    19  Temp: 99.2 F (37.3 C)     TempSrc:      SpO2:    100%  Weight:      Height:       Constitutional: appears appropriate, NAD, calm Eyes: PERRL, lids and conjunctivae normal ENMT: Mucous membranes are dry. Posterior pharynx clear of any exudate or lesions. Age-appropriate dentition. Hearing appropriate Neck: normal, supple, no masses, no thyromegaly Respiratory: clear to auscultation bilaterally, no wheezing, no crackles. Normal respiratory effort. No accessory muscle use.  Cardiovascular: Regular rate and rhythm, no murmurs / rubs / gallops.  Bilateral lower extremity  2+ pitting edema. 2+ pedal pulses. No carotid bruits.  Abdomen: Obese abdomen, no tenderness, no masses palpated, no hepatosplenomegaly. Bowel sounds positive.  Musculoskeletal: no clubbing / cyanosis. No joint deformity upper and lower extremities. Good ROM, no contractures, no atrophy. Normal muscle tone.  Skin: no rashes, lesions, ulcers. No induration Neurologic: Sensation intact. Strength 5/5 in all 4.  Psychiatric: Normal judgment and insight. Alert and oriented x 3. Normal mood.   EKG: independently reviewed, showing sinus rhythm with rate of 76, QTc 457  Chest x-ray on Admission: I personally reviewed and I agree with radiologist reading as below.  CT Cervical Spine Wo Contrast Result Date: 07/10/2023 CLINICAL DATA:  Fall 4 days ago. Trauma to head. Weakness and dizziness. EXAM: CT CERVICAL SPINE WITHOUT CONTRAST TECHNIQUE: Multidetector CT imaging of the cervical spine was performed without intravenous contrast. Multiplanar CT image reconstructions were also generated. RADIATION DOSE REDUCTION: This  exam was performed according to the departmental dose-optimization program which includes automated exposure control, adjustment of the mA and/or kV according to patient size and/or use of iterative reconstruction technique. COMPARISON:  CT of the cervical spine 11/15/2021 FINDINGS: Alignment: No significant listhesis is present. Ankylosis of the cervical and upper thoracic spine is again noted. Straightening of the normal cervical lordosis is present. Skull base and vertebrae: The vertebral body heights are normal. No acute or healing fractures are present. Soft tissues and spinal canal: No prevertebral fluid or swelling. No visible canal hematoma. The upper thoracic esophagus is dilated and fluid-filled. No discrete lesion is present. Disc levels: Ossification of posterior longitudinal ligament contributes to central canal stenosis at C2-3 and most significantly at C3-4. Severe central canal stenosis is present at C3-4 where the canal is narrowed to 5 mm. Uncovertebral spurring contributes to foraminal stenosis on the left at C3-4 and C5-6 and on the right at C4-5. Upper chest: The lung apices are clear. The thoracic inlet is within normal limits. IMPRESSION: 1. No acute or healing fractures of the cervical spine. 2. Ankylosis of the cervical and upper thoracic spine. 3. Ossification of posterior longitudinal ligament contributes to central canal stenosis at C2-3 and most significantly at C3-4. 4. Severe central canal stenosis at C3-4 where the canal is narrowed to 5 mm. 5. Uncovertebral spurring contributes to foraminal stenosis on the left at C3-4 and C5-6 and on the right at C4-5. 6. Dilated and fluid-filled upper thoracic esophagus. No discrete lesion is present. Electronically Signed   By: Audree Leas M.D.   On: 07/10/2023 16:34   CT HEAD WO CONTRAST ( ) Result Date: 07/10/2023 CLINICAL DATA:  Trauma EXAM: CT HEAD WITHOUT CONTRAST TECHNIQUE: Contiguous axial images were obtained from the base of  the skull through the vertex without intravenous contrast. RADIATION DOSE REDUCTION: This exam was performed according to the departmental dose-optimization program which includes automated exposure control, adjustment of the mA and/or kV according to patient size and/or use of iterative reconstruction technique. COMPARISON:  Head CT 11/22/2022. FINDINGS: Brain: No evidence of acute infarction, hemorrhage, hydrocephalus, extra-axial collection or mass lesion/mass effect. There is mild periventricular white matter hypodensity. Vascular: No hyperdense vessel or unexpected calcification. Skull: Normal. Negative for fracture or focal lesion. Sinuses/Orbits: No acute finding. Other: There is soft tissue swelling overlying the left frontal region. IMPRESSION: 1. No acute intracranial process. 2. Soft tissue swelling overlying the left frontal region. Electronically Signed   By: Tyron Gallon M.D.   On: 07/10/2023 16:32   DG Chest Portable 1 View Result Date: 07/10/2023  CLINICAL DATA:  Weakness and dizziness, status post fall 4 days ago. EXAM: PORTABLE CHEST 1 VIEW COMPARISON:  February 16, 2023 FINDINGS: The heart size and mediastinal contours are within normal limits. Both lungs are clear. Multilevel degenerative changes are seen throughout the thoracic spine. IMPRESSION: No active disease. Electronically Signed   By: Virgle Grime M.D.   On: 07/10/2023 15:04    Labs on Admission: I have personally reviewed following labs CBC: Recent Labs  Lab 07/10/23 1417  WBC 9.9  NEUTROABS 8.0*  HGB 15.1  HCT 44.1  MCV 96.3  PLT 177   Basic Metabolic Panel: Recent Labs  Lab 07/10/23 1417  NA 131*  K 3.2*  CL 88*  CO2 23  GLUCOSE 157*  BUN 34*  CREATININE 1.28*  CALCIUM 9.2  MG 1.7   GFR: Estimated Creatinine Clearance: 60.1 mL/min (A) (by C-G formula based on SCr of 1.28 mg/dL (H)).  Liver Function Tests: Recent Labs  Lab 07/10/23 1417  AST 51*  ALT 48*  ALKPHOS 83  BILITOT 3.6*  PROT 6.6   ALBUMIN 3.9   Cardiac Enzymes: Recent Labs  Lab 07/10/23 1548  CKTOTAL 155   Thyroid Function Tests: Recent Labs    07/10/23 1417  TSH 4.640*  FREET4 1.15*   Urine analysis:    Component Value Date/Time   COLORURINE AMBER (A) 07/10/2023 1417   APPEARANCEUR HAZY (A) 07/10/2023 1417   APPEARANCEUR Clear 10/29/2021 1034   LABSPEC 1.020 07/10/2023 1417   PHURINE 6.0 07/10/2023 1417   GLUCOSEU 50 (A) 07/10/2023 1417   HGBUR SMALL (A) 07/10/2023 1417   BILIRUBINUR NEGATIVE 07/10/2023 1417   BILIRUBINUR Negative 10/29/2021 1034   KETONESUR 20 (A) 07/10/2023 1417   PROTEINUR 100 (A) 07/10/2023 1417   NITRITE NEGATIVE 07/10/2023 1417   LEUKOCYTESUR NEGATIVE 07/10/2023 1417   This document was prepared using Dragon Voice Recognition software and may include unintentional dictation errors.  Dr. Reinhold Carbine [facility]  If 7PM-7AM, please contact overnight-coverage provider If 7AM-7PM, please contact day attending provider [email or web address]  07/10/2023, 7:54 PM

## 2023-07-10 NOTE — ED Provider Notes (Signed)
 [facility] Provider Note    Event Date/Time   First MD Initiated Contact with Patient 07/10/23 1412     (approximate)   History   No chief complaint on file.   HPI  John Booker is a 73 y.o. male who comes in from home for a weakness.  Patient reports that about 4 days ago he got weak and dizzy and fell hit his head.  Patient states that he hit his head and that it was bleeding a lot but eventually it did stop.  He states that he is unsure when his last tetanus shot was.  He reports coming in today just for worsening weakness and difficulty getting around.  He denies any chest pain, abdominal pain.  He has had some worsening swelling in his bilateral legs.  Physical Exam   Triage Vital Signs: ED Triage Vitals [07/10/23 1411]  Encounter Vitals Group     BP (!) 146/97     Systolic BP Percentile      Diastolic BP Percentile      Pulse Rate (!) 138     Resp 20     Temp 98.1 F (36.7 C)     Temp Source Oral     SpO2 99 %     Weight      Height      Head Circumference      Peak Flow      Pain Score      Pain Loc      Pain Education      Exclude from Growth Chart     Most recent vital signs: Vitals:   07/10/23 1411  BP: (!) 146/97  Pulse: (!) 138  Resp: 20  Temp: 98.1 F (36.7 C)  SpO2: 99%     General: Awake, no distress.  CV:  Good peripheral perfusion.  Resp:  Normal effort.  Abd:  No distention.  Other:  No chest wall tenderness no abdominal tenderness moving all extremities well 2+ edema noted bilaterally.  Abrasion noted to his forehead with scab noted   ED Results / Procedures / Treatments   Labs (all labs ordered are listed, but only abnormal results are displayed) Labs Reviewed  CBC WITH DIFFERENTIAL/PLATELET - Abnormal; Notable for the following components:      Result Value   Neutro Abs 8.0 (*)    Abs Immature Granulocytes 0.24 (*)    All other components within normal limits  COMPREHENSIVE METABOLIC PANEL WITH GFR   BRAIN NATRIURETIC PEPTIDE  MAGNESIUM   TSH  T4, FREE  URINALYSIS, ROUTINE W REFLEX MICROSCOPIC  TROPONIN I (HIGH SENSITIVITY)     EKG  My interpretation of EKG:  Narrow complex tachycardia with rate of 135 without any ST elevation or T wave inversions, normal intervals  EKG appears sinus with a rate of 81 without any ST elevation or T wave inversions, left anterior fascicular block  RADIOLOGY I have reviewed the xray personally and interpreted no evidence of any edema   PROCEDURES:  Critical Care performed: No  .1-3 Lead EKG Interpretation  Performed by: Lubertha Rush, MD Authorized by: Lubertha Rush, MD     Interpretation: abnormal     ECG rate:  120   ECG rate assessment: tachycardic     Rhythm: SVT     Ectopy: none     Conduction: normal      MEDICATIONS ORDERED IN ED: Medications  sodium chloride  0.9 % bolus 250 mL (has no administration  in time range)  diltiazem  (CARDIZEM ) injection 10 mg (10 mg Intravenous Given 07/10/23 1454)     IMPRESSION / MDM / ASSESSMENT AND PLAN / ED COURSE  I reviewed the triage vital signs and the nursing notes.   Patient's presentation is most consistent with acute presentation with potential threat to life or bodily function.   Patient comes in with weakness, fall.  Patient significantly tachycardic holding on fluid given my concerns for potential fluid overload given significant edema in his legs.  Will get workup to evaluate for Electra MIs, AKI, intercranial hemorrhage, cervical fracture.  Patient does have a wound to his forehead but this happened 4 days ago no indication for closure at this time and it already starting to heal with scabbing.  EKG with concern for SVT I gave 10 of Dilt which does appear that patient is now in sinus  Labs do show slight elevation of thyroid.  CBC reassuring CMP shows some chronic hyponatremia but a worsening AKI, anion gap is elevated.  BNP is elevated  Given patient's weakness I suspect  patient will require admission to the hospital.  Patient will require CTs and then admission.  Patient does report alcohol abuse but he states that he has not used for a few weeks however family is not sure if that is true or not.  His labs look consistent with palpable possible alcohol ketoacidosis.  We discussed a small fluid bolus given no edema noted in his lungs.  Given patient does have AKI, weakness.  His EKG repeat does appear sinus.  Will continue to monitor him cardiac wise.  Patient will be admitted to the hospital after CT imaging.  I am going to order ultrasounds of his legs to make sure no DVTs that could be contributing to the swelling rather than just CHF.  The patient is on the cardiac monitor to evaluate for evidence of arrhythmia and/or significant heart rate changes.      FINAL CLINICAL IMPRESSION(S) / ED DIAGNOSES   Final diagnoses:  SVT (supraventricular tachycardia) (HCC)  Dehydration  Leg swelling     Rx / DC Orders   ED Discharge Orders     None        Note:  This document was prepared using Dragon voice recognition software and may include unintentional dictation errors.   Lubertha Rush, MD 07/10/23 1539

## 2023-07-10 NOTE — Assessment & Plan Note (Addendum)
 With increased falls at home PT, OT, TOC Fall precautions

## 2023-07-10 NOTE — Assessment & Plan Note (Addendum)
 Potassium chloride  10 mEq IV, 3 doses ordered

## 2023-07-10 NOTE — ED Notes (Signed)
 This RN asked pt for urine sample and pt stated he couldn't go

## 2023-07-10 NOTE — ED Triage Notes (Signed)
 Pt to ED via [facility] from home for weakness and dizziness. Pt fell 4 days ago and hit his head. Pt complains of weakness and dizziness at this time. Per EMS pt has worsening edema in BLE. Pt has hx of hypertension and CHF. Pt has bilateral +2 pitting edema.   EMS vitals: BP 151/87 HR 130 SPO2 100% RA

## 2023-07-10 NOTE — Assessment & Plan Note (Signed)
 Home metoprolol  succinate 25 mg daily resumed Hydralazine  5 mg IV every 6 hours.  For SBP greater 165, 5 days ordered

## 2023-07-11 ENCOUNTER — Observation Stay

## 2023-07-11 ENCOUNTER — Encounter: Payer: Self-pay | Admitting: Internal Medicine

## 2023-07-11 DIAGNOSIS — N179 Acute kidney failure, unspecified: Secondary | ICD-10-CM | POA: Diagnosis not present

## 2023-07-11 LAB — CBC
HCT: 35.1 % — ABNORMAL LOW (ref 39.0–52.0)
Hemoglobin: 12.1 g/dL — ABNORMAL LOW (ref 13.0–17.0)
MCH: 33.3 pg (ref 26.0–34.0)
MCHC: 34.5 g/dL (ref 30.0–36.0)
MCV: 96.7 fL (ref 80.0–100.0)
Platelets: 116 10*3/uL — ABNORMAL LOW (ref 150–400)
RBC: 3.63 MIL/uL — ABNORMAL LOW (ref 4.22–5.81)
RDW: 12.4 % (ref 11.5–15.5)
WBC: 5.6 10*3/uL (ref 4.0–10.5)
nRBC: 0 % (ref 0.0–0.2)

## 2023-07-11 LAB — BASIC METABOLIC PANEL WITH GFR
Anion gap: 12 (ref 5–15)
BUN: 26 mg/dL — ABNORMAL HIGH (ref 8–23)
CO2: 29 mmol/L (ref 22–32)
Calcium: 8.2 mg/dL — ABNORMAL LOW (ref 8.9–10.3)
Chloride: 92 mmol/L — ABNORMAL LOW (ref 98–111)
Creatinine, Ser: 1.01 mg/dL (ref 0.61–1.24)
GFR, Estimated: >60 mL/min (ref >60)
Glucose, Bld: 87 mg/dL (ref 70–99)
Potassium: 3.2 mmol/L — ABNORMAL LOW (ref 3.5–5.1)
Sodium: 133 mmol/L — ABNORMAL LOW (ref 135–145)

## 2023-07-11 MED ORDER — THIAMINE HCL 100 MG/ML IJ SOLN: 100.0000 mg | Freq: Every day | INTRAMUSCULAR | Status: DC

## 2023-07-11 MED ORDER — FUROSEMIDE 20 MG PO TABS
20.0000 mg | ORAL_TABLET | Freq: Every day | ORAL | Status: DC
  Administered 2023-07-11 – 2023-07-12 (×2): 20 mg via ORAL
  Filled 2023-07-11 (×2): qty 1

## 2023-07-11 MED ORDER — CHLORPROMAZINE HCL 25 MG/ML IJ SOLN
25.0000 mg | Freq: Three times a day (TID) | INTRAMUSCULAR | Status: DC | PRN
  Administered 2023-07-11: 25 mg via INTRAMUSCULAR
  Filled 2023-07-11 (×2): qty 1

## 2023-07-11 MED ORDER — POTASSIUM CHLORIDE CRYS ER 20 MEQ PO TBCR
40.0000 meq | EXTENDED_RELEASE_TABLET | ORAL | Status: AC
  Administered 2023-07-11 (×2): 40 meq via ORAL
  Filled 2023-07-11 (×2): qty 2

## 2023-07-11 MED ORDER — LORAZEPAM 2 MG/ML IJ SOLN: 1.0000 mg | INTRAMUSCULAR | Status: AC | PRN

## 2023-07-11 MED ORDER — LORAZEPAM 1 MG PO TABS: 1.0000 mg | ORAL_TABLET | ORAL | Status: AC | PRN

## 2023-07-11 MED ORDER — METHOCARBAMOL 500 MG PO TABS
500.0000 mg | ORAL_TABLET | Freq: Three times a day (TID) | ORAL | Status: DC
  Administered 2023-07-11 – 2023-07-15 (×13): 500 mg via ORAL
  Filled 2023-07-11 (×13): qty 1

## 2023-07-11 MED ORDER — LIDOCAINE 5 % EX PTCH
1.0000 | MEDICATED_PATCH | CUTANEOUS | Status: DC
  Administered 2023-07-11 – 2023-07-14 (×4): 1 via TRANSDERMAL
  Filled 2023-07-11 (×4): qty 1

## 2023-07-11 MED ORDER — FOLIC ACID 1 MG PO TABS
1.0000 mg | ORAL_TABLET | Freq: Every day | ORAL | Status: DC
  Administered 2023-07-11 – 2023-07-15 (×5): 1 mg via ORAL
  Filled 2023-07-11 (×5): qty 1

## 2023-07-11 MED ORDER — HYDROCODONE-ACETAMINOPHEN 5-325 MG PO TABS
1.0000 | ORAL_TABLET | ORAL | Status: DC | PRN
  Administered 2023-07-11 – 2023-07-14 (×4): 1 via ORAL
  Filled 2023-07-11 (×4): qty 1

## 2023-07-11 MED ORDER — THIAMINE MONONITRATE 100 MG PO TABS
100.0000 mg | ORAL_TABLET | Freq: Every day | ORAL | Status: DC
  Administered 2023-07-11 – 2023-07-15 (×5): 100 mg via ORAL
  Filled 2023-07-11 (×5): qty 1

## 2023-07-11 MED ORDER — ADULT MULTIVITAMIN W/MINERALS CH
1.0000 | ORAL_TABLET | Freq: Every day | ORAL | Status: DC
  Administered 2023-07-11 – 2023-07-15 (×5): 1 via ORAL
  Filled 2023-07-11 (×5): qty 1

## 2023-07-11 MED ORDER — TETANUS-DIPHTH-ACELL PERTUSSIS 5-2.5-18.5 LF-MCG/0.5 IM SUSY
0.5000 mL | PREFILLED_SYRINGE | Freq: Once | INTRAMUSCULAR | Status: AC
  Administered 2023-07-11: 0.5 mL via INTRAMUSCULAR
  Filled 2023-07-11: qty 0.5

## 2023-07-11 MED ORDER — PREDNISONE 10 MG PO TABS
5.0000 mg | ORAL_TABLET | Freq: Every day | ORAL | Status: DC
  Administered 2023-07-12: 5 mg via ORAL
  Filled 2023-07-11: qty 1

## 2023-07-11 NOTE — Progress Notes (Signed)
 OT Cancellation Note  Patient Details Name: [name] MRN: [id number] DOB: 1950/12/31   Cancelled Treatment:    Reason Eval/Treat Not Completed: Other (comment) (Patient refusing; irritated with OT and PT, contradicting self about his mobility abilities and strongly refusing to get out of bed. MD notified of pt status; recommend d/c OT at this time and reconsult if pt's cognition improves.) Pt seen to be using a t-shirt as a towel (wiping his face and nose with it). Pt unable to be educated on the importance of therapy. MD notified.  Loree Roe 07/11/2023, 11:46 AM

## 2023-07-11 NOTE — Progress Notes (Signed)
 PT Cancellation Note  Patient Details Name: NOEY STEINES MRN: [id number] DOB: 21-Feb-1951   Cancelled Treatment:    Reason Eval/Treat Not Completed: Other (comment). Consult received and chart reviewed. Pt irritated with staff and currently declines all mobility attempts. Coordinated with OT, however still declining. Sent secure chat to care team. Recommend initiate mobility efforts with RN staff and then re-order therapy consults if needs persists as pt is adamant about not receiving therapy while admitted. Let us  know if he changes his mind.   Lewi Drost 07/11/2023, 11:32 AM Amparo Balk, PT, DPT, GCS [phone]

## 2023-07-11 NOTE — Care Management CC44 (Signed)
 Condition Code 44 Documentation Completed  Patient Details  Name: KYZIER ISGRO MRN: [id number] Date of Birth: [birth year]   Condition Code 44 given:    Patient signature on Condition Code 44 notice:    Documentation of 2 MD's agreement:    Code 44 added to claim:       Zoe Hinds, RN 07/11/2023, 3:10 PM

## 2023-07-11 NOTE — Progress Notes (Signed)
 [facility] Progress Note Patient: John Booker NWG:[id number] DOB: January 03, 1951 DOA: 07/10/2023  DOS: the patient was seen and examined on 07/11/2023  Brief Hospital Course: John Booker is a 73 year old male with history of hypertension, CKD stage II/IIIa, presents to the emergency department for chief concerns of weakness and dizziness and falling 4 days ago.  Assessment and plan. AKI (acute kidney injury) (HCC) Suspect secondary to poor p.o. intake Treated with IV fluid.  Now we will hold.  Dysphagia. Patient had an aspiration event. SLP consulted. Patient has chronic dysphagia in the setting of hiatal hernia.  Medically deferring surgical intervention right now.   Hyponatremia Secondary to poor p.o. intake. Monitor.  History of alcohol abuse. CIWA ordered. Monitor for withdrawal.  Confusional state. Intermittent. Etiology not clear but Monitor for now.   Essential hypertension Home metoprolol  succinate 25 mg daily resumed Hydralazine  5 mg IV every 6 hours.  For SBP greater 165, 5 days ordered   Generalized weakness With increased falls at home PT, OT, TOC Fall precautions  Lower extremity edema. Chronic. On Lasix . Will resume.  Compression stocking.   Hypokalemia Potassium chloride  10 mEq IV, 3 doses ordered  Subjective: Denies any acute complaint.  No nausea or vomiting.  Was agitated with PT and OT.  Later had an episode of aspiration while trying to eat mashed potatoes.  Daughter reports patient drinks significant amount of alcohol.  Daughter reports that the aspiration has been progressively getting worse.  Patient unable to ambulate with fatigue.  Physical Exam: General: in Mild distress, No Rash, laceration on the forehead area Cardiovascular: S1 and S2 Present, No Murmur Respiratory: Good respiratory effort, Bilateral Air entry present. No Crackles, No wheezes Abdomen: Bowel Sound present, No tenderness Extremities: Bilateral edema Neuro: Alert  and oriented x3, no new focal deficit  Data Reviewed: I have Reviewed nursing notes, Vitals, and Lab results. Since last encounter, pertinent lab results CBC and BMP   . I have ordered test including CBC and BMP  .   Disposition: Status is: Observation heparin  injection 5,000 Units Start: 07/10/23 2200 Place TED hose Start: 07/10/23 1707   Family Communication: Daughter at bedside Level of care: Med-Surg   Vitals:   07/11/23 1100 07/11/23 1154 07/11/23 1254 07/11/23 1443  BP: (!) 166/82 (!) 156/81  138/71  Pulse: 88 67 88 76  Resp: 20 16  16   Temp: 98.7 F (37.1 C) 98.8 F (37.1 C)  97.7 F (36.5 C)  TempSrc: Oral     SpO2: 100% 99% (!) 84% 97%  Weight:      Height:         Author: Charlean Congress, MD 07/11/2023 5:59 PM  Please look on [email or web address] to find out who is on call.

## 2023-07-11 NOTE — Evaluation (Signed)
 Clinical/Bedside Swallow Evaluation Patient Details  Name: John Booker MRN: [id number] Date of Birth: [birth year]  Today's Date: 07/11/2023 Time: SLP Start Time (ACUTE ONLY): 1330 SLP Stop Time (ACUTE ONLY): 1400 SLP Time Calculation (min) (ACUTE ONLY): 30 min  Past Medical History:  Past Medical History:  Diagnosis Date   (HFpEF) heart failure with preserved ejection fraction (HCC)    a.) TTE 04/11/2021: EF 60-65%, no RWMAs, G1DD, mild LAE, RVSF norm, asc Ao 38 mm; b.) TTE 11/18/2021: EF 60-65%, no RWMAs, mild LVH, G1DD, norm RVSH; c.) TEE 11/20/2021: EF 60-65%, no RWMAs, no LAA thrombus, triv MR, no valvular vegetation, no IAS; d.) TTE 11/24/2022: EF 60-65%, no RWMAs, mod LVH, norm RVSF   Alcohol abuse, in remission    Aortic atherosclerosis (HCC)    Bacteremia due to Gram-negative bacteria (Klebsiella oxytoca)    a.) source unidentified; b.) admitted to [facility] 11/20/2021 - 12/28/2021   Bilateral lower extremity edema    BPH (benign prostatic hyperplasia)    Cholelithiasis    Chronic hiccups    CKD (chronic kidney disease), stage III (HCC)    DOE (dyspnea on exertion)    Esophageal dysmotility    GERD (gastroesophageal reflux disease)    Hemorrhoids    Hepatic cyst    History of bilateral cataract extraction 2024   History of hiatal hernia    Hypertension    Mediastinal mass    Memory changes    Metabolic encephalopathy    a.) in the setting of bacteremia/sepsis   MSSA bacteremia 11/2021   a.) source was foot wounds and probable septic arthritis in the lumbar spine   Nephrolithiasis    Neuropathy    Non-traumatic rhabdomyolysis    Paraspinal mass    Renal cyst, right    Sigmoid diverticulosis    Past Surgical History:  Past Surgical History:  Procedure Laterality Date   BRONCHIAL WASHINGS N/A 02/16/2023   Procedure: BRONCHIAL WASHINGS;  Surgeon: Erskin Hearing, MD;  Location: [facility];  Service: Thoracic;  Laterality: N/A;   CATARACT EXTRACTION W/PHACO Left 11/13/2022    Procedure: CATARACT EXTRACTION PHACO AND INTRAOCULAR LENS PLACEMENT (IOC) LEFT OMIDRIA    13.58  01:23.3;  Surgeon: Trudi Fus, MD;  Location: [facility];  Service: Ophthalmology;  Laterality: Left;   CATARACT EXTRACTION W/PHACO Right 01/08/2023   Procedure: CATARACT EXTRACTION PHACO AND INTRAOCULAR LENS PLACEMENT (IOC) RIGHT OMIDRIA  14.23 01:16.5;  Surgeon: Trudi Fus, MD;  Location: [facility];  Service: Ophthalmology;  Laterality: Right;   ESOPHAGOGASTRODUODENOSCOPY (EGD) WITH PROPOFOL  N/A 05/26/2018   Procedure: ESOPHAGOGASTRODUODENOSCOPY (EGD) WITH PROPOFOL ;  Surgeon: Irby Mannan, MD;  Location: [facility];  Service: Endoscopy;  Laterality: N/A;   ESOPHAGOGASTRODUODENOSCOPY (EGD) WITH PROPOFOL  N/A 12/28/2020   Procedure: ESOPHAGOGASTRODUODENOSCOPY (EGD) WITH PROPOFOL ;  Surgeon: Quintin Buckle, DO;  Location: [facility];  Service: Gastroenterology;  Laterality: N/A;   ESOPHAGOGASTRODUODENOSCOPY (EGD) WITH PROPOFOL  N/A 10/09/2021   Procedure: ESOPHAGOGASTRODUODENOSCOPY (EGD) WITH PROPOFOL ;  Surgeon: Luke Salaam, MD;  Location: [facility];  Service: Gastroenterology;  Laterality: N/A;   FLEXIBLE BRONCHOSCOPY N/A 02/16/2023   Procedure: FLEXIBLE BRONCHOSCOPY;  Surgeon: Erskin Hearing, MD;  Location: [facility];  Service: Thoracic;  Laterality: N/A;   INCISION AND DRAINAGE PERIRECTAL ABSCESS N/A 11/19/2015   Procedure: IRRIGATION AND DEBRIDEMENT PERIRECTAL ABSCESS and debridement of scrotal abscess;  Surgeon: Claudia Cuff, MD;  Location: [facility];  Service: General;  Laterality: N/A;   INCISION AND DRAINAGE PERIRECTAL ABSCESS N/A 11/20/2015   Procedure: IRRIGATION AND DEBRIDEMENT  PERIRECTAL ABSCESS / WITH DRESSING CHANGE;  Surgeon: Claudia Cuff, MD;  Location: [facility];  Service: General;  Laterality: N/A;  peri rectal site   INCISION AND DRAINAGE PERIRECTAL ABSCESS N/A 11/21/2015   Procedure: IRRIGATION AND DEBRIDEMENT PERIRECTAL  ABSCESS WITH DRESSING CHANGE;  Surgeon: Claudia Cuff, MD;  Location: [facility];  Service: General;  Laterality: N/A;   MINOR HEMORRHOIDECTOMY  1989   TEE WITHOUT CARDIOVERSION N/A 11/20/2021   Procedure: TRANSESOPHAGEAL ECHOCARDIOGRAM (TEE);  Surgeon: Michelle Aid, MD;  Location: [facility];  Service: Cardiovascular;  Laterality: N/A;  8:00am   HPI:  Per H&P, " [name] is a 73 year old male with history of hypertension, CKD stage II/IIIa, presents to the emergency department for chief concerns of weakness and dizziness and falling 4 days ago. Vitals in the ED showed temperature of 98.1, respiration rate 19, heart rate 91, blood pressure 146/97, SpO2 100% on room air. Serum sodium is 131, potassium 3.2, chloride 88, bicarb 22, BUN of 34, serum creatinine 1.28, EGFR 59, nonfasting blood glucose 159, WBC 9.9, hemoglobin 15.1, platelets 177. High sensitive troponin was 18 and on repeat was 17.  CK was 155. UA was negative for leukocytes and nitrates.  Beta hydroxybutyrate was elevated at 396. BNP was elevated at 541.5." DG Chest 07/11/23: No active disease. Head CT 07/10/23: "No acute intracranial process. Soft tissue swelling overlying the left frontal region."    Assessment / Plan / Recommendation  Clinical Impression  Pt seen for bedside swallow assessment in the setting of choking per RN, with MD reporting concern for chronic dysphagia. Pt last seen by SLP on 12/27/2020- with complaints of persistent globus sensation at that time. Per GI note on 02/17/2022, "Barium swallow showed esophageal dysmotility and spasm and inability of the lower esophagus to relax and there were concerns for obstructive mass.  Underwent an EGD was recommended outpatient CT chest abdomen and pelvis, colonoscopy for weight loss, outpatient esophageal manometry and motility. 12/28/2020 EGD: Showed erosions in the duodenal bulb hiatal hernia erythema in the stomach abnormal motility was noted in the esophagus increased tone  of the LES dilated empirically to 51 French biopsies showed chronic duodenitis reactive gastropathy.Aaron AasAaron AasHe has had hiccups for over 5 years responded to baclofen  to a great extent and at that visit he also had some dysphagia on and off. 10/09/2021: EGD: LA grade B esophagitis seen otherwise no gross abnormalities."       Per pt's daughter, pt choked during lunch with mashed potatoes. RN also reporting concern for coughing/choking on chopped solids and thin liquids. Pt reported inability to keep "anything down" regarding any solids, denied issue with water. Pt is currently on room air and Dys2 and thin liquid diet. Today, pt seen with trials of nectar thick liquids (magic cup) via spoon. Pt w/o overt s/sx of aspiration with completion of trials, though noted quick rate of intake. Following completion, pt with belching, heaving and eventual regurgitation of frothy white material. Pt refused further trials. Education shared to pt/daughter regarding known esophageal concerns directly impacting on PO intake and increasing risk for aspiration post swallow. General aspiration risk increased by fast rate of intake and reduced tolerance of elevated HOB.       Given limited assessment this date, no diet recommendation/modifications recommended. When education provided for options of diets (puree vs full liquid vs continued chopped) pt refused consideration. SLP will continue to follow for further assessment of oropharyngeal function. Recommend GI consult/esophageal assessment to determine current severity of  impairment. MD and RN aware of recommendations. SLP Visit Diagnosis: Dysphagia, unspecified (R13.10) (chronic esophageal dysphagia with potential impact on pharyngeal function)    Aspiration Risk  Moderate aspiration risk    Diet Recommendation    (defer to MD)       Other  Recommendations Recommended Consults: Consider esophageal assessment;Consider GI evaluation Oral Care Recommendations: Oral care BID     Recommendations for follow up therapy are one component of a multi-disciplinary discharge planning process, led by the attending physician.  Recommendations may be updated based on patient status, additional functional criteria and insurance authorization.  Follow up Recommendations Follow physician's recommendations for discharge plan and follow up therapies      Assistance Recommended at Discharge    Functional Status Assessment Patient has had a recent decline in their functional status and demonstrates the ability to make significant improvements in function in a reasonable and predictable amount of time.  Frequency and Duration min 2x/week  2 weeks       Prognosis Prognosis for improved oropharyngeal function: Guarded Barriers to Reach Goals: Time post onset;Severity of deficits;Behavior      Swallow Study   General Date of Onset: 07/11/23 HPI: Per H&P, " [name] is a 73 year old male with history of hypertension, CKD stage II/IIIa, presents to the emergency department for chief concerns of weakness and dizziness and falling 4 days ago. Vitals in the ED showed temperature of 98.1, respiration rate 19, heart rate 91, blood pressure 146/97, SpO2 100% on room air. Serum sodium is 131, potassium 3.2, chloride 88, bicarb 22, BUN of 34, serum creatinine 1.28, EGFR 59, nonfasting blood glucose 159, WBC 9.9, hemoglobin 15.1, platelets 177. High sensitive troponin was 18 and on repeat was 17.  CK was 155. UA was negative for leukocytes and nitrates.  Beta hydroxybutyrate was elevated at 396. BNP was elevated at 541.5." DG Chest 07/11/23: No active disease. Head CT 07/10/23: "No acute intracranial process. Soft tissue swelling overlying the left frontal region." Type of Study: Bedside Swallow Evaluation Previous Swallow Assessment: Oct 2022 Diet Prior to this Study: Dysphagia 2 (finely chopped);Thin liquids (Level 0) Temperature Spikes Noted: No (99.2; WBC 5.6) Respiratory Status: Room  air History of Recent Intubation: No Behavior/Cognition: Impulsive;Uncooperative;Agitated Oral Cavity Assessment: Within Functional Limits Oral Care Completed by SLP: No Oral Cavity - Dentition: Adequate natural dentition (not formally viewed) Vision: Functional for self-feeding Self-Feeding Abilities: Able to feed self Patient Positioning: Partially reclined Baseline Vocal Quality: Wet Volitional Cough: Strong Volitional Swallow: Unable to elicit    Oral/Motor/Sensory Function Overall Oral Motor/Sensory Function: Within functional limits   Ice Chips Ice chips: Not tested   Thin Liquid Thin Liquid: Not tested    Nectar Thick Nectar Thick Liquid: Impaired Presentation: Spoon;Self Fed Oral Phase Impairments: Other (comment) (min oral residue- quick rate) Oral phase functional implications: Oral residue Pharyngeal Phase Impairments: Throat Clearing - Delayed (?impact of regurgitation)   Honey Thick Honey Thick Liquid: Not tested   Puree Puree: Not tested   Solid     Solid: Not tested     [place] Coda Filler Clapp, MS, CCC-SLP Speech Language Pathologist Rehab Services; [facility] [place] [phone] (ascom)   [place] J Clapp 07/11/2023,2:30 PM

## 2023-07-12 DIAGNOSIS — D72819 Decreased white blood cell count, unspecified: Secondary | ICD-10-CM | POA: Diagnosis present

## 2023-07-12 DIAGNOSIS — N179 Acute kidney failure, unspecified: Secondary | ICD-10-CM | POA: Diagnosis present

## 2023-07-12 DIAGNOSIS — W19XXXA Unspecified fall, initial encounter: Secondary | ICD-10-CM | POA: Diagnosis present

## 2023-07-12 DIAGNOSIS — F1021 Alcohol dependence, in remission: Secondary | ICD-10-CM | POA: Diagnosis not present

## 2023-07-12 DIAGNOSIS — R296 Repeated falls: Secondary | ICD-10-CM | POA: Diagnosis present

## 2023-07-12 DIAGNOSIS — E860 Dehydration: Secondary | ICD-10-CM | POA: Diagnosis present

## 2023-07-12 DIAGNOSIS — I471 Supraventricular tachycardia, unspecified: Secondary | ICD-10-CM | POA: Diagnosis present

## 2023-07-12 DIAGNOSIS — Z23 Encounter for immunization: Secondary | ICD-10-CM | POA: Diagnosis not present

## 2023-07-12 DIAGNOSIS — F32A Depression, unspecified: Secondary | ICD-10-CM | POA: Diagnosis present

## 2023-07-12 DIAGNOSIS — E871 Hypo-osmolality and hyponatremia: Secondary | ICD-10-CM | POA: Diagnosis present

## 2023-07-12 DIAGNOSIS — K222 Esophageal obstruction: Secondary | ICD-10-CM | POA: Diagnosis present

## 2023-07-12 DIAGNOSIS — N400 Enlarged prostate without lower urinary tract symptoms: Secondary | ICD-10-CM | POA: Diagnosis present

## 2023-07-12 DIAGNOSIS — Z66 Do not resuscitate: Secondary | ICD-10-CM | POA: Diagnosis present

## 2023-07-12 DIAGNOSIS — I5032 Chronic diastolic (congestive) heart failure: Secondary | ICD-10-CM | POA: Diagnosis present

## 2023-07-12 DIAGNOSIS — R627 Adult failure to thrive: Secondary | ICD-10-CM | POA: Diagnosis present

## 2023-07-12 DIAGNOSIS — I110 Hypertensive heart disease with heart failure: Secondary | ICD-10-CM | POA: Diagnosis present

## 2023-07-12 DIAGNOSIS — F1011 Alcohol abuse, in remission: Secondary | ICD-10-CM | POA: Diagnosis present

## 2023-07-12 DIAGNOSIS — Z79890 Hormone replacement therapy (postmenopausal): Secondary | ICD-10-CM | POA: Diagnosis not present

## 2023-07-12 DIAGNOSIS — K220 Achalasia of cardia: Secondary | ICD-10-CM | POA: Diagnosis not present

## 2023-07-12 DIAGNOSIS — M488X2 Other specified spondylopathies, cervical region: Secondary | ICD-10-CM | POA: Diagnosis present

## 2023-07-12 DIAGNOSIS — E876 Hypokalemia: Secondary | ICD-10-CM | POA: Diagnosis present

## 2023-07-12 DIAGNOSIS — D6959 Other secondary thrombocytopenia: Secondary | ICD-10-CM | POA: Diagnosis present

## 2023-07-12 DIAGNOSIS — K219 Gastro-esophageal reflux disease without esophagitis: Secondary | ICD-10-CM | POA: Diagnosis present

## 2023-07-12 DIAGNOSIS — D696 Thrombocytopenia, unspecified: Secondary | ICD-10-CM | POA: Diagnosis present

## 2023-07-12 DIAGNOSIS — E039 Hypothyroidism, unspecified: Secondary | ICD-10-CM | POA: Diagnosis present

## 2023-07-12 LAB — CBC
HCT: 36.8 % — ABNORMAL LOW (ref 39.0–52.0)
Hemoglobin: 12.8 g/dL — ABNORMAL LOW (ref 13.0–17.0)
MCH: 33.4 pg (ref 26.0–34.0)
MCHC: 34.8 g/dL (ref 30.0–36.0)
MCV: 96.1 fL (ref 80.0–100.0)
Platelets: 112 10*3/uL — ABNORMAL LOW (ref 150–400)
RBC: 3.83 MIL/uL — ABNORMAL LOW (ref 4.22–5.81)
RDW: 12.4 % (ref 11.5–15.5)
WBC: 3.8 10*3/uL — ABNORMAL LOW (ref 4.0–10.5)
nRBC: 0 % (ref 0.0–0.2)

## 2023-07-12 LAB — BASIC METABOLIC PANEL WITH GFR
Anion gap: 10 (ref 5–15)
BUN: 22 mg/dL (ref 8–23)
CO2: 33 mmol/L — ABNORMAL HIGH (ref 22–32)
Calcium: 8.7 mg/dL — ABNORMAL LOW (ref 8.9–10.3)
Chloride: 95 mmol/L — ABNORMAL LOW (ref 98–111)
Creatinine, Ser: 0.82 mg/dL (ref 0.61–1.24)
GFR, Estimated: >60 mL/min (ref >60)
Glucose, Bld: 97 mg/dL (ref 70–99)
Potassium: 3.5 mmol/L (ref 3.5–5.1)
Sodium: 138 mmol/L (ref 135–145)

## 2023-07-12 LAB — PHOSPHORUS: Phosphorus: <1 mg/dL — CL (ref 2.5–4.6)

## 2023-07-12 LAB — MAGNESIUM: Magnesium: 1.5 mg/dL — ABNORMAL LOW (ref 1.7–2.4)

## 2023-07-12 MED ORDER — PANCRELIPASE (LIP-PROT-AMYL) 12000-38000 UNITS PO CPEP
24000.0000 [IU] | ORAL_CAPSULE | Freq: Three times a day (TID) | ORAL | Status: DC
  Administered 2023-07-12 – 2023-07-15 (×8): 24000 [IU] via ORAL
  Filled 2023-07-12 (×8): qty 2

## 2023-07-12 MED ORDER — POTASSIUM PHOSPHATES 15 MMOLE/5ML IV SOLN
30.0000 mmol | Freq: Once | INTRAVENOUS | Status: AC
  Administered 2023-07-12: 30 mmol via INTRAVENOUS
  Filled 2023-07-12: qty 10

## 2023-07-12 MED ORDER — MAGNESIUM SULFATE 4 GM/100ML IV SOLN
4.0000 g | Freq: Once | INTRAVENOUS | Status: AC
Start: 2023-07-12 — End: 2023-07-12
  Administered 2023-07-12: 4 g via INTRAVENOUS
  Filled 2023-07-12: qty 100

## 2023-07-12 MED ORDER — ENSURE ENLIVE PO LIQD: 237.0000 mL | Freq: Two times a day (BID) | ORAL | Status: DC

## 2023-07-12 MED ORDER — SODIUM CHLORIDE 0.9 % IV BOLUS
500.0000 mL | Freq: Once | INTRAVENOUS | Status: AC
  Administered 2023-07-12: 500 mL via INTRAVENOUS

## 2023-07-12 MED ORDER — POTASSIUM CHLORIDE IN NACL 20-0.9 MEQ/L-% IV SOLN
INTRAVENOUS | Status: DC
  Filled 2023-07-12 (×2): qty 1000

## 2023-07-12 MED ORDER — PROCHLORPERAZINE MALEATE 10 MG PO TABS
10.0000 mg | ORAL_TABLET | Freq: Three times a day (TID) | ORAL | Status: DC
  Administered 2023-07-12 – 2023-07-15 (×9): 10 mg via ORAL
  Filled 2023-07-12 (×11): qty 1

## 2023-07-12 NOTE — Progress Notes (Signed)
 SLP Cancellation Note  Patient Details Name: John Booker MRN: [id number] DOB: [birth year]   Cancelled treatment:       Reason Eval/Treat Not Completed: Patient declined, no reason specified (Pt declined SLP efforts despite education. Will continue as appropriate.)  Dia Forget, M.S., CCC-SLP Speech-Language Pathologist [facility] [phone] [place])  Adin Honour 07/12/2023, 9:55 AM

## 2023-07-12 NOTE — Progress Notes (Signed)
 MEWS Progress Note  Patient Details Name: John Booker MRN: [id number] DOB: September 25, 1950 Today's Date: 07/12/2023   MEWS Flowsheet Documentation:  Assess: MEWS Score Temp: 98.1 F (36.7 C) BP: 94/66 MAP (mmHg): 76 Pulse Rate: (!) 132 ECG Heart Rate: (!) 59 Resp: 12 Level of Consciousness: Alert SpO2: 98 % O2 Device: Room Air Assess: MEWS Score MEWS Temp: 0 MEWS Systolic: 1 MEWS Pulse: 3 MEWS RR: 1 MEWS LOC: 0 MEWS Score: 5 MEWS Score Color: Red Assess: SIRS CRITERIA SIRS Temperature : 0 SIRS Respirations : 0 SIRS Pulse: 1 SIRS WBC: 0 SIRS Score Sum : 1 SIRS Temperature : 0 SIRS Pulse: 1 SIRS Respirations : 0 SIRS WBC: 0 SIRS Score Sum : 1 Assess: if the MEWS score is Yellow or Red Were vital signs accurate and taken at a resting state?: Yes Does the patient meet 2 or more of the SIRS criteria?: Yes Does the patient have a confirmed or suspected source of infection?: No MEWS guidelines implemented : Yes, red Treat MEWS Interventions: Considered administering scheduled or prn medications/treatments as ordered Take Vital Signs Increase Vital Sign Frequency : Red: Q1hr x2, continue Q4hrs until patient remains green for 12hrs Escalate MEWS: Escalate: Red: Discuss with charge nurse and notify provider. Consider notifying RRT. If remains red for 2 hours consider need for higher level of care Notify: Charge Nurse/RN Name of Charge Nurse/RN Notified: erica, rn Provider Notification Provider Name/Title: dr. Donaciano Frizzle Date Provider Notified: 07/12/23 Time Provider Notified: 1701 Method of Notification: Page Notification Reason: Other (Comment) (red mews) Provider response: See new orders Date of Provider Response: 07/12/23 Time of Provider Response: 1701 Notify: Rapid Response Name of Rapid Response RN Notified: not notified Date Rapid Response Notified: 07/12/23 Time Rapid Response Notified: 1701      John Booker 07/12/2023, 5:01 PM

## 2023-07-12 NOTE — Care Management Obs Status (Signed)
 MEDICARE OBSERVATION STATUS NOTIFICATION   Patient Details  Name: John Booker MRN: [id number] Date of Birth: 1951-01-24   Medicare Observation Status Notification Given:  Yes    Anise Kerns 07/12/2023, 10:29 AM

## 2023-07-12 NOTE — Significant Event (Signed)
       CROSS COVER NOTE  NAME: John Booker MRN: [id number] DOB : [birth year] ATTENDING PHYSICIAN: Donaciano Frizzle, MD    Date of Service   07/12/2023   HPI/Events of Note   Critical phos <1.0 reported from nurse via page Mag 1.5  Interventions   Assessment/Plan: 30 mmol k phos  IV 4 gram IV mag         Teauna Dubach L Tomicka Lover NP [facility] Cross Cover 7pm-7am - check amion for availability Pager [phone]

## 2023-07-12 NOTE — Progress Notes (Signed)
 Progress Note   Patient: John Booker EXB:[id number] DOB: [birth year] DOA: 07/10/2023     1 DOS: the patient was seen and examined on 07/12/2023   Brief hospital course: [name] is a 73 year old male with history of hypertension, CKD stage II/IIIa, presents to the emergency department for chief concerns of weakness and dizziness and falling 4 days ago. Per patient daughter, patient has some dementia, also drink large amount alcohol daily.  Has been falling, but never had a syncope. He has worsening diarrhea for the last month, to the point he was incontinent of stool.  He also has severe hiccups for the past year, which has prevented him from eating. Before admission to the hospital, he was very dizzy. Upon arriving hospital, patient had a significant electrolyte abnormality with AKI.  Received IV fluids in the emergency room.   Principal Problem:   AKI (acute kidney injury) (HCC) Active Problems:   Hyponatremia   Essential hypertension   Generalized weakness   Hypokalemia   GERD without esophagitis   Alcohol dependence in sustained full remission (HCC)   Assessment and Plan:  * AKI (acute kidney injury) (HCC) Hyponatremia. Hypokalemia Hypomagnesia. Hypophosphatemia. This appeared to be secondary to decreased p.o. intake and diarrhea.  He received IV fluids, renal function has normalized.  Discontinue IV fluids. Replete potassium, magnesium  and phosphate.  Sodium level has normalized.  Chronic diarrhea. Failure to thrive. Frequent fall. Patient has chronic diarrhea, potentially from pancreatic insufficiency due to chronic alcoholism. Will start Creon. Obtain PT/OT.  Chronic hiccups. Patient has hiccups for the last 2 years, has been seen by PCP multiple times, could not improve with any measures. MRI of the brain 5/23 did not show any acute changes or mass. CT chest/abdomen/pelvis with contrast on 9/24 did not show any evidence of malignancy. Will try Compazine 10  mg 3 times a day.  Chronic alcoholism. Advised to quit.  Continue CIWA protocol and thiamine .  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 Continue to follow-up with PCP as outpatient.  Leukopenia with thrombocytopenia. Likely secondary to alcoholism.  Continue to follow.  Essential hypertension Home metoprolol  succinate 25 mg daily resumed   Generalized weakness With increased falls at home PT, OT, TOC Fall precautions  Hypothyroidism Mild elevation in TSH, will start Synthroid 25 mcg daily.      Subjective:  Patient does not want to talk, believes that make his hiccups worse He does not remember the diarrhea he had yesterday. Denies any short of breath or cough.  Physical Exam: Vitals:   07/11/23 1443 07/11/23 2007 07/12/23 0335 07/12/23 0849  BP: 138/71 126/67 (!) 169/88 (!) 159/89  Pulse: 76 76  66  Resp: 16 16  18   Temp: 97.7 F (36.5 C) 97.8 F (36.6 C) 98.2 F (36.8 C) 98 F (36.7 C)  TempSrc:  Oral    SpO2: 97% 100%  100%  Weight:      Height:       General exam: Appears calm and comfortable  Respiratory system: Clear to auscultation. Respiratory effort normal. Cardiovascular system: S1 & S2 heard, RRR. No JVD, murmurs, rubs, gallops or clicks. No pedal edema. Gastrointestinal system: Abdomen is nondistended, soft and nontender. No organomegaly or masses felt. Normal bowel sounds heard. Central nervous system: Alert and oriented x2. No focal neurological deficits. Extremities: Symmetric 5 x 5 power. Skin: No rashes, lesions or ulcers Psychiatry: Depressed mood.   Data Reviewed:  Reviewed all imaging studies and lab results.  Family Communication: Daughter updated over the  phone.  Disposition: Status is: Observation The patient will require care spanning > 2 midnights and should be moved to inpatient because: IV Treatment: Severity of illness.     Time spent: 55 minutes  Author: Donaciano Frizzle, MD 07/12/2023 1:37 PM  For on call review  [email or web address].

## 2023-07-13 ENCOUNTER — Inpatient Hospital Stay

## 2023-07-13 DIAGNOSIS — N179 Acute kidney failure, unspecified: Secondary | ICD-10-CM | POA: Diagnosis not present

## 2023-07-13 LAB — BASIC METABOLIC PANEL WITH GFR
Anion gap: 7 (ref 5–15)
BUN: 23 mg/dL (ref 8–23)
CO2: 32 mmol/L (ref 22–32)
Calcium: 8.3 mg/dL — ABNORMAL LOW (ref 8.9–10.3)
Chloride: 96 mmol/L — ABNORMAL LOW (ref 98–111)
Creatinine, Ser: 0.86 mg/dL (ref 0.61–1.24)
GFR, Estimated: >60 mL/min (ref >60)
Glucose, Bld: 95 mg/dL (ref 70–99)
Potassium: 4.5 mmol/L (ref 3.5–5.1)
Sodium: 135 mmol/L (ref 135–145)

## 2023-07-13 LAB — MAGNESIUM: Magnesium: 2 mg/dL (ref 1.7–2.4)

## 2023-07-13 LAB — CBC
HCT: 37.5 % — ABNORMAL LOW (ref 39.0–52.0)
Hemoglobin: 12.7 g/dL — ABNORMAL LOW (ref 13.0–17.0)
MCH: 32.7 pg (ref 26.0–34.0)
MCHC: 33.9 g/dL (ref 30.0–36.0)
MCV: 96.6 fL (ref 80.0–100.0)
Platelets: 146 10*3/uL — ABNORMAL LOW (ref 150–400)
RBC: 3.88 MIL/uL — ABNORMAL LOW (ref 4.22–5.81)
RDW: 12.2 % (ref 11.5–15.5)
WBC: 5.2 10*3/uL (ref 4.0–10.5)
nRBC: 0 % (ref 0.0–0.2)

## 2023-07-13 LAB — PHOSPHORUS: Phosphorus: 1.3 mg/dL — ABNORMAL LOW (ref 2.5–4.6)

## 2023-07-13 MED ORDER — ENSURE ENLIVE PO LIQD
237.0000 mL | Freq: Three times a day (TID) | ORAL | Status: DC
  Administered 2023-07-13 – 2023-07-15 (×2): 237 mL via ORAL

## 2023-07-13 MED ORDER — ZINC SULFATE 220 (50 ZN) MG PO CAPS
220.0000 mg | ORAL_CAPSULE | Freq: Every day | ORAL | Status: DC
  Administered 2023-07-14 – 2023-07-15 (×2): 220 mg via ORAL
  Filled 2023-07-13 (×2): qty 1

## 2023-07-13 MED ORDER — LOSARTAN POTASSIUM 50 MG PO TABS
100.0000 mg | ORAL_TABLET | Freq: Every day | ORAL | Status: DC
  Administered 2023-07-13 – 2023-07-15 (×3): 100 mg via ORAL
  Filled 2023-07-13 (×3): qty 2

## 2023-07-13 MED ORDER — VITAMIN C 500 MG PO TABS
500.0000 mg | ORAL_TABLET | Freq: Two times a day (BID) | ORAL | Status: DC
  Administered 2023-07-14 – 2023-07-15 (×3): 500 mg via ORAL
  Filled 2023-07-13 (×3): qty 1

## 2023-07-13 MED ORDER — SODIUM PHOSPHATES 45 MMOLE/15ML IV SOLN
45.0000 mmol | Freq: Once | INTRAVENOUS | Status: AC
  Administered 2023-07-13: 45 mmol via INTRAVENOUS
  Filled 2023-07-13 (×3): qty 15

## 2023-07-13 NOTE — Evaluation (Signed)
 Occupational Therapy Evaluation Patient Details Name: John Booker MRN: [id number] DOB: 06/25/1950 Today's Date: 07/13/2023   History of Present Illness   73 y/o male presented to ED on 07/10/23 for weakness and dizziness after fall 4 days ago. Admitted for AKI and hyponatremia. PMH: HTN, CKD stage II/IIIa, alcohol dependence, chronic hiccups, dementia?     Clinical Impressions Pt was seen for OT evaluation this date. PTA, pt reports living alone in a one level home with 7STE and bil HR. Reports IND with ADLs and use of RW as needed for mobility. Multiple falls at home in the last 6 months.  Pt presents to acute OT demonstrating impaired ADL performance and functional mobility 2/2 weakness, balance deficits, and low activity tolerance. Pt currently requires CGA for bed mobility, STS and Min/CGA for mobility in the room using RW. Pt with short, shuffling steps and poor activity tolerance. Easily agitated with questions and when asked to get OOB stating "I can't talk anymore, I'm going to start the hiccups." Declined all ADLs this date. Pt would benefit from skilled OT services to address noted impairments and functional limitations to maximize safety and independence while minimizing falls risk and caregiver burden. Do anticipate the need for follow up OT services upon acute hospital DC.       If plan is discharge home, recommend the following:   A lot of help with bathing/dressing/bathroom;A little help with walking and/or transfers;Assistance with cooking/housework;Help with stairs or ramp for entrance     Functional Status Assessment   Patient has had a recent decline in their functional status and demonstrates the ability to make significant improvements in function in a reasonable and predictable amount of time.     Equipment Recommendations   Other (comment) (defer)     Recommendations for Other Services         Precautions/Restrictions   Precautions Precautions:  Fall Recall of Precautions/Restrictions: Impaired Restrictions Weight Bearing Restrictions Per Provider Order: No     Mobility Bed Mobility Overal bed mobility: Needs Assistance Bed Mobility: Supine to Sit, Sit to Supine     Supine to sit: Contact guard, HOB elevated, Used rails Sit to supine: Contact guard assist   General bed mobility comments: increased time/effort with HOB elevated    Transfers Overall transfer level: Needs assistance Equipment used: Rolling walker (2 wheels) Transfers: Sit to/from Stand Sit to Stand: Contact guard assist           General transfer comment: max encouragement and cueing for safety; small shuffling steps during mobility      Balance Overall balance assessment: Needs assistance, History of Falls Sitting-balance support: No upper extremity supported, Feet supported Sitting balance-Leahy Scale: Good     Standing balance support: Bilateral upper extremity supported, Reliant on assistive device for balance Standing balance-Leahy Scale: Poor Standing balance comment: RW and +1 for external support                           ADL either performed or assessed with clinical judgement   ADL Overall ADL's : Needs assistance/impaired                                       General ADL Comments: anticipate Min/Mod A for LB ADLs and Min A for toileting tasks     Vision  Perception         Praxis         Pertinent Vitals/Pain Pain Assessment Pain Assessment: No/denies pain     Extremity/Trunk Assessment Upper Extremity Assessment Upper Extremity Assessment: Overall WFL for tasks assessed   Lower Extremity Assessment Lower Extremity Assessment: Generalized weakness   Cervical / Trunk Assessment Cervical / Trunk Assessment: Kyphotic   Communication Communication Communication: No apparent difficulties   Cognition Arousal: Alert Behavior During Therapy: Agitated                                  Following commands: Intact       Cueing  General Comments          Exercises     Shoulder Instructions      Home Living Family/patient expects to be discharged to:: Private residence Living Arrangements: Alone Available Help at Discharge: Family;Available PRN/intermittently Type of Home: House Home Access: Stairs to enter [employer] of Steps: 7 Entrance Stairs-Rails: Right;Left Home Layout: One level     Bathroom Shower/Tub: Chief Strategy Officer: Standard     Home Equipment: Agricultural consultant (2 wheels);Shower seat   Additional Comments: reports family nearby with nephew who is an EMT; unsure how often/reliable their assist is      Prior Functioning/Environment Prior Level of Function : Independent/Modified Independent;History of Falls (last six months);Driving             Mobility Comments: uses a RW PRN ADLs Comments: independent    OT Problem List: Decreased strength;Impaired balance (sitting and/or standing);Decreased safety awareness   OT Treatment/Interventions: Self-care/ADL training;Balance training;Therapeutic exercise;Therapeutic activities;DME and/or AE instruction;Patient/family education      OT Goals(Current goals can be found in the care plan section)   Acute Rehab OT Goals Patient Stated Goal: improve function OT Goal Formulation: With patient Time For Goal Achievement: 07/27/23 Potential to Achieve Goals: Fair ADL Goals Pt Will Perform Lower Body Bathing: sitting/lateral leans;sit to/from stand;with contact guard assist Pt Will Perform Lower Body Dressing: with contact guard assist;sit to/from stand;sitting/lateral leans Pt Will Transfer to Toilet: ambulating;with supervision;regular height toilet;grab bars Pt Will Perform Toileting - Clothing Manipulation and hygiene: with contact guard assist;sit to/from stand;sitting/lateral leans   OT Frequency:  Min 1X/week    Co-evaluation    Reason for Co-Treatment: For patient/therapist safety;To address functional/ADL transfers PT goals addressed during session: Balance;Mobility/safety with mobility        AM-PAC OT "6 Clicks" Daily Activity     Outcome Measure Help from another person eating meals?: None Help from another person taking care of personal grooming?: None Help from another person toileting, which includes using toliet, bedpan, or urinal?: A Little Help from another person bathing (including washing, rinsing, drying)?: A Lot Help from another person to put on and taking off regular upper body clothing?: A Little Help from another person to put on and taking off regular lower body clothing?: A Lot 6 Click Score: 18   End of Session Equipment Utilized During Treatment: Rolling walker (2 wheels) Nurse Communication: Mobility status  Activity Tolerance: Patient tolerated treatment well;Treatment limited secondary to agitation Patient left: in bed;with call bell/phone within reach;with bed alarm set  OT Visit Diagnosis: Other abnormalities of gait and mobility (R26.89);Unsteadiness on feet (R26.81);Repeated falls (R29.6)                Time: 1610-9604 OT Time Calculation (min): 15 min  Charges:  OT General Charges $OT Visit: 1 Visit OT Evaluation $OT Eval Low Complexity: 1 Low  John Booker, John Booker 07/13/23, 1:00 PM   John Booker 07/13/2023, 12:53 PM

## 2023-07-13 NOTE — Evaluation (Signed)
 Physical Therapy Evaluation Patient Details Name: John Booker MRN: [id number] DOB: Aug 23, 1950 Today's Date: 07/13/2023  History of Present Illness  73 y/o male presented to ED on 07/10/23 for weakness and dizziness after fall 4 days ago. Admitted for AKI and hyponatremia. PMH: HTN, CKD stage II/IIIa, alcohol dependence, chronic hiccups, dementia?  Clinical Impression  Patient admitted with the above. PTA, patient lives alone and reports independence with RW, however per chart, patient with multiple falls. Patient agitated during session when asked to get OOB. Agreeable to minimal activity this date. Required CGA for bed mobility with HOB elevated. Stood from bed with CGA and RW. Ambulated ~15' with RW and minA-CGA for safety. Demonstrates shuffling gait pattern with forward flexed trunk. Declined further attempts at mobility due to agitation of therapists asking him to get up. Patient will benefit from skilled PT services during acute stay to address listed deficits. Patient will benefit from ongoing therapy at discharge to maximize functional independence and safety.       If plan is discharge home, recommend the following: A little help with walking and/or transfers;A little help with bathing/dressing/bathroom;Assistance with cooking/housework;Direct supervision/assist for financial management;Direct supervision/assist for medications management;Assist for transportation;Help with stairs or ramp for entrance;Supervision due to cognitive status   Can travel by private vehicle   Yes    Equipment Recommendations Rolling Khianna Blazina (2 wheels)  Recommendations for Other Services       Functional Status Assessment Patient has had a recent decline in their functional status and demonstrates the ability to make significant improvements in function in a reasonable and predictable amount of time.     Precautions / Restrictions Precautions Precautions: Fall Recall of Precautions/Restrictions:  Impaired Restrictions Weight Bearing Restrictions Per Provider Order: No      Mobility  Bed Mobility Overal bed mobility: Needs Assistance Bed Mobility: Supine to Sit, Sit to Supine     Supine to sit: Contact guard Sit to supine: Contact guard assist   General bed mobility comments: increased time to complete with HOB elevated    Transfers Overall transfer level: Needs assistance Equipment used: Rolling Malayasia Mirkin (2 wheels) Transfers: Sit to/from Stand Sit to Stand: Contact guard assist                Ambulation/Gait Ambulation/Gait assistance: Min assist, Contact guard assist Gait Distance (Feet): 15 Feet Assistive device: Rolling Ioana Louks (2 wheels) Gait Pattern/deviations: Step-to pattern, Decreased stride length, Shuffle, Trunk flexed Gait velocity: decreased     General Gait Details: assist for mild balance deficits but mostly CGA with short ambulation. Declined any further distance  Stairs            Wheelchair Mobility     Tilt Bed    Modified Rankin (Stroke Patients Only)       Balance Overall balance assessment: Needs assistance, History of Falls Sitting-balance support: No upper extremity supported, Feet supported Sitting balance-Leahy Scale: Good     Standing balance support: Bilateral upper extremity supported, Reliant on assistive device for balance Standing balance-Leahy Scale: Poor                               Pertinent Vitals/Pain Pain Assessment Pain Assessment: No/denies pain    Home Living Family/patient expects to be discharged to:: Private residence Living Arrangements: Alone Available Help at Discharge: Family;Available PRN/intermittently Type of Home: House Home Access: Stairs to enter Entrance Stairs-Rails: Right;Left Entrance Stairs-Number of Steps: 7   Home  Layout: One level Home Equipment: Agricultural consultant (2 wheels);Shower seat      Prior Function Prior Level of Function : Independent/Modified  Independent;History of Falls (last six months);Driving             Mobility Comments: uses a RW PRN ADLs Comments: independent     Extremity/Trunk Assessment   Upper Extremity Assessment Upper Extremity Assessment: Defer to OT evaluation    Lower Extremity Assessment Lower Extremity Assessment: Generalized weakness    Cervical / Trunk Assessment Cervical / Trunk Assessment: Kyphotic  Communication   Communication Communication: No apparent difficulties    Cognition Arousal: Alert Behavior During Therapy: Agitated   PT - Cognitive impairments: No family/caregiver present to determine baseline                       PT - Cognition Comments: agitated with request to get OOB Following commands: Intact       Cueing       General Comments      Exercises     Assessment/Plan    PT Assessment Patient needs continued PT services  PT Problem List Decreased strength;Decreased activity tolerance;Decreased balance;Decreased mobility;Decreased cognition;Decreased knowledge of use of DME;Decreased safety awareness;Decreased knowledge of precautions;Cardiopulmonary status limiting activity       PT Treatment Interventions DME instruction;Gait training;Functional mobility training;Therapeutic activities;Balance training;Therapeutic exercise;Patient/family education    PT Goals (Current goals can be found in the Care Plan section)  Acute Rehab PT Goals Patient Stated Goal: to not get up PT Goal Formulation: With patient Time For Goal Achievement: 07/27/23 Potential to Achieve Goals: Fair    Frequency Min 1X/week     Co-evaluation PT/OT/SLP Co-Evaluation/Treatment: Yes Reason for Co-Treatment: For patient/therapist safety;To address functional/ADL transfers PT goals addressed during session: Balance;Mobility/safety with mobility         AM-PAC PT "6 Clicks" Mobility  Outcome Measure Help needed turning from your back to your side while in a flat bed  without using bedrails?: A Little Help needed moving from lying on your back to sitting on the side of a flat bed without using bedrails?: A Little Help needed moving to and from a bed to a chair (including a wheelchair)?: A Little Help needed standing up from a chair using your arms (e.g., wheelchair or bedside chair)?: A Little Help needed to walk in hospital room?: A Little Help needed climbing 3-5 steps with a railing? : A Lot 6 Click Score: 17    End of Session   Activity Tolerance: Patient tolerated treatment well Patient left: in bed;with call bell/phone within reach;with bed alarm set Nurse Communication: Mobility status PT Visit Diagnosis: Unsteadiness on feet (R26.81);Muscle weakness (generalized) (M62.81)    Time: 4098-1191 PT Time Calculation (min) (ACUTE ONLY): 11 min   Charges:   PT Evaluation $PT Eval Moderate Complexity: 1 Mod   PT General Charges $$ ACUTE PT VISIT: 1 Visit         Janine Melbourne, PT, DPT Physical Therapist - [facility]  [facility]   Demorris Choyce A Amena Dockham 07/13/2023, 12:33 PM

## 2023-07-13 NOTE — Progress Notes (Addendum)
 Progress Note   Patient: John Booker ZOX:[id number] DOB: 03/29/1950 DOA: 07/10/2023     2 DOS: the patient was seen and examined on 07/13/2023   Brief hospital course: Mr. John Booker is a 73 year old male with history of hypertension, CKD stage II/IIIa, presents to the emergency department for chief concerns of weakness and dizziness and falling 4 days ago. Per patient daughter, patient has some dementia, also drink large amount alcohol daily.  Has been falling, but never had a syncope. He has worsening diarrhea for the last month, to the point he was incontinent of stool.  He also has severe hiccups for the past year, which has prevented him from eating. Before admission to the hospital, he was very dizzy. Upon arriving hospital, patient had a significant electrolyte abnormality with AKI.  Received IV fluids in the emergency room.   Principal Problem:   AKI (acute kidney injury) (HCC) Active Problems:   Hyponatremia   Essential hypertension   Generalized weakness   Hypokalemia   GERD without esophagitis   Alcohol dependence in sustained full remission (HCC)   Hypophosphatemia   Hypomagnesemia   Assessment and Plan: * AKI (acute kidney injury) (HCC) Hyponatremia. Hypokalemia Hypomagnesia. Hypophosphatemia. This appeared to be secondary to decreased p.o. intake and diarrhea.  He received IV fluids, renal function has normalized.  Discontinue IV fluids. Replete phosphate.  Potassium and magnesium  has normalized.  Sodium level normalized. Patient had significant tachycardia and borderline blood pressure, received fluid bolus.  Blood pressure is elevated now.  No need for additional IV fluids.   Chronic diarrhea. Failure to thrive. Frequent fall. Patient has chronic diarrhea, potentially from pancreatic insufficiency due to chronic alcoholism. Started Creon. Diarrhea improving, PT/OT to evaluate today.   Chronic hiccups. Patient has hiccups for the last 2 years, has been seen  by PCP multiple times, could not improve with any measures. MRI of the brain 5/23 did not show any acute changes or mass. CT chest/abdomen/pelvis with contrast on 9/24 did not show any evidence of malignancy. Scheduled Compazine 10 mg 3 times a day. Also complaining of dysphagia, obtain barium esophagram.   Chronic alcoholism. Advised to quit.  Continue CIWA protocol and thiamine .   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 Continue to follow-up with PCP as outpatient.   Leukopenia with thrombocytopenia. Likely secondary to alcoholism. Improving   Essential hypertension Resuming home medicines, blood pressure running higher.     Generalized weakness With increased falls at home PT, OT, TOC Fall precautions   Hypothyroidism Mild elevation in TSH, will start Synthroid 25 mcg daily.  Bilateral foot wounds. Patient has mild wounds on the right great toe and left great and second toes.  These appear to be secondary to fall.  No evidence of infection.  Patient states that they are already much better.  No need for additional treatment.        Subjective:  Patient feels much better today, no additional diarrhea.  No nausea vomiting.  Physical Exam: Vitals:   07/13/23 0105 07/13/23 0322 07/13/23 0424 07/13/23 0811  BP: (!) 159/85 (!) 164/88 (!) 170/85 (!) 173/88  Pulse: 73 66 71 79  Resp: 18 18 20 16   Temp: 97.7 F (36.5 C) 98 F (36.7 C) 97.8 F (36.6 C) 97.7 F (36.5 C)  TempSrc: Oral Oral  Oral  SpO2: 100% 90% 100% 99%  Weight:      Height:       General exam: Appears calm and comfortable  Respiratory system: Clear to auscultation. Respiratory  effort normal. Cardiovascular system: S1 & S2 heard, RRR. No JVD, murmurs, rubs, gallops or clicks. No pedal edema. Gastrointestinal system: Abdomen is nondistended, soft and nontender. No organomegaly or masses felt. Normal bowel sounds heard. Central nervous system: Alert and oriented x3. No focal neurological  deficits. Extremities: Symmetric 5 x 5 power. Skin: No rashes, lesions or ulcers Psychiatry: Flat affect    Data Reviewed:  Lab results reviewed.  Family Communication: Daughter updated over the phone.  Disposition: Status is: Inpatient Remains inpatient appropriate because: Severity of disease, IV treatment.     Time spent: 35 minutes  Author: Donaciano Frizzle, MD 07/13/2023 10:01 AM  For on call review [email or web address].

## 2023-07-13 NOTE — TOC Initial Note (Addendum)
 Transition of Care [facility]) - Initial/Assessment Note    Patient Details  Name: John Booker MRN: [id number] Date of Birth: 1950/12/02  [facility] CM/SW Contact:    Odilia Bennett, LCSW Phone Number: 07/13/2023, 12:46 PM  Clinical Narrative:  Readmission prevention screen complete. CSW met with patient. Daughter at bedside. CSW introduced role and explained that discharge planning would be discussed. PCP is Dr. Jerone Moorman. Daughter drives him to appointments. He uses [facility]. No issues obtaining medications. Patient lives home alone. No home health prior to admission. Patient has a RW and shower chair. PT/OT evaluations are pending. No further concerns. CSW will continue to follow patient and his daughter for support and facilitate discharge once medically stable. Daughter will transport him home at discharge.        2:01 pm: Daughter will talk to patient about SNF recommendation. TOC will follow up tomorrow.          Expected Discharge Plan: Home w Home Health Services Barriers to Discharge: Continued Medical Work up   Patient Goals and CMS Choice            Expected Discharge Plan and Services       Living arrangements for the past 2 months: Single Family Home                                      Prior Living Arrangements/Services Living arrangements for the past 2 months: Single Family Home Lives with:: Self Patient language and need for interpreter reviewed:: Yes Do you feel safe going back to the place where you live?: Yes      Need for Family Participation in Patient Care: Yes (Comment) Care giver support system in place?: Yes (comment) Current home services: DME Criminal Activity/Legal Involvement Pertinent to Current Situation/Hospitalization: No - Comment as needed  Activities of Daily Living   ADL Screening (condition at time of admission) Independently performs ADLs?: Yes (appropriate for developmental age) Is the patient  deaf or have difficulty hearing?: Yes Does the patient have difficulty seeing, even when wearing glasses/contacts?: No Does the patient have difficulty concentrating, remembering, or making decisions?: No  Permission Sought/Granted Permission sought to share information with : Family Supports Permission granted to share information with : Yes, Verbal Permission Granted  Share Information with NAME: Elodie Haines     Permission granted to share info w Relationship: Daughter  Permission granted to share info w Contact Information: [phone]  Emotional Assessment Appearance:: Appears stated age Attitude/Demeanor/Rapport: Unable to Assess Affect (typically observed): Unable to Assess Orientation: : Oriented to Self, Oriented to Place, Oriented to Situation Alcohol / Substance Use: Not Applicable Psych Involvement: No (comment)  Admission diagnosis:  Dehydration [E86.0] Hyponatremia [E87.1] SVT (supraventricular tachycardia) (HCC) [I47.10] Leg swelling [M79.89] AKI (acute kidney injury) (HCC) [N17.9] Patient Active Problem List   Diagnosis Date Noted   Hypophosphatemia 07/12/2023   Hypomagnesemia 07/12/2023   Weakness 12/13/2022   Serum total bilirubin elevated 12/13/2022   Sepsis (HCC) 11/23/2022   Acute metabolic encephalopathy 11/23/2022   History of MSSA bacteremia secondary to foot wounds, s/p treatment 9/13 - 12/28/21 02/18/2022   SIRS (systemic inflammatory response syndrome) (HCC) 02/18/2022   Mediastinal mass 02/18/2022   Benign paraspinal mass 11/20/2021   Staphylococcal arthritis of vertebra (HCC)    Acute renal insufficiency    Non-traumatic rhabdomyolysis    GERD without esophagitis 11/16/2021   Hypokalemia  11/16/2021   Hyponatremia 11/16/2021   Bacteremia due to methicillin susceptible Staphylococcus aureus (MSSA) 11/16/2021   AKI (acute kidney injury) (HCC) 11/15/2021   Stage 3a chronic kidney disease (HCC) 11/07/2021   Protrusion of cervical  intervertebral disc 07/23/2021   Exertional dyspnea 07/04/2021   History of alcohol abuse 07/04/2021   History of tobacco abuse 07/04/2021   Chronic diastolic CHF (congestive heart failure) (HCC) 06/26/2021   Headache 06/26/2021   Low serum albumin 05/10/2021   Malnutrition of moderate degree 12/28/2020   Esophageal dysmotility 12/28/2020   Generalized weakness 12/26/2020   COVID-19 vaccination declined 03/21/2020   Overweight (BMI 25.0-29.9) 12/20/2019   Blister of skin 11/07/2019   Neuropathy 04/04/2019   Pre-ulcerative calluses 10/07/2018   Skin lesion 10/07/2018   Plantar flexed metatarsal bone of right foot 10/07/2018   Hiatal hernia    Stomach irritation    Intractable hiccups 10/16/2017   Elevated serum creatinine 07/15/2017   Bilateral lower extremity edema 05/08/2017   Alcohol abuse 03/06/2017   Alcohol dependence in sustained full remission (HCC) 03/05/2017   Essential hypertension 11/15/2015   Vitamin D  deficiency 11/15/2015   PCP:  Monique Ano, MD Pharmacy:   [facility] - [place], [place] - [address] [place] [address] Phone: [phone] Fax: [phone]  [facility] [address], [place] - [address] [place] [address] Phone: [phone] Fax: [phone]     Social Drivers of Health (SDOH) Social History: SDOH Screenings   Food Insecurity: No Food Insecurity (07/11/2023)  Housing: Low Risk  (07/11/2023)  Transportation Needs: No Transportation Needs (07/11/2023)  Utilities: Not At Risk (07/11/2023)  Depression (PHQ2-9): Low Risk  (12/12/2021)  Financial Resource Strain: Low Risk  (05/07/2023)   Received from [facility]  Social Connections: Socially Isolated (07/11/2023)  Tobacco Use: Medium Risk (07/11/2023)   SDOH Interventions:     Readmission Risk Interventions    07/13/2023   12:44 PM  Readmission Risk Prevention Plan  Transportation Screening Complete  PCP or  Specialist Appt within 5-7 Days Complete  Medication Review (RN CM) Complete

## 2023-07-13 NOTE — Progress Notes (Signed)
 SLP Cancellation Note  Patient Details Name: John Booker MRN: [id number] DOB: 05/30/1950   Cancelled treatment:       Reason Eval/Treat Not Completed: Other (comment) MD in agreement for need for DG esophagus to further assess esophogeal component of current dysphagia presentation. SLP will follow up pending results of DG esophagus.   [place] Audrey Eller Clapp, MS, CCC-SLP Speech Language Pathologist Rehab Services; [facility] [phone] (ascom)     [place] J Clapp 07/13/2023, 12:29 PM

## 2023-07-14 ENCOUNTER — Inpatient Hospital Stay: Admitting: Certified Registered"

## 2023-07-14 ENCOUNTER — Encounter
Admission: EM | Disposition: A | Discharge status: Skilled Nursing Facility | Payer: Self-pay | Source: Home / Self Care | Attending: Internal Medicine

## 2023-07-14 ENCOUNTER — Encounter: Payer: Self-pay | Admitting: Internal Medicine

## 2023-07-14 DIAGNOSIS — N179 Acute kidney failure, unspecified: Secondary | ICD-10-CM | POA: Diagnosis not present

## 2023-07-14 DIAGNOSIS — F1021 Alcohol dependence, in remission: Secondary | ICD-10-CM | POA: Diagnosis not present

## 2023-07-14 HISTORY — PX: ESOPHAGOGASTRODUODENOSCOPY: SHX5428

## 2023-07-14 LAB — BASIC METABOLIC PANEL WITH GFR
Anion gap: 6 (ref 5–15)
BUN: 18 mg/dL (ref 8–23)
CO2: 31 mmol/L (ref 22–32)
Calcium: 8.8 mg/dL — ABNORMAL LOW (ref 8.9–10.3)
Chloride: 97 mmol/L — ABNORMAL LOW (ref 98–111)
Creatinine, Ser: 0.81 mg/dL (ref 0.61–1.24)
GFR, Estimated: >60 mL/min (ref >60)
Glucose, Bld: 104 mg/dL — ABNORMAL HIGH (ref 70–99)
Potassium: 4.4 mmol/L (ref 3.5–5.1)
Sodium: 134 mmol/L — ABNORMAL LOW (ref 135–145)

## 2023-07-14 LAB — CBC
HCT: 37.6 % — ABNORMAL LOW (ref 39.0–52.0)
Hemoglobin: 12.7 g/dL — ABNORMAL LOW (ref 13.0–17.0)
MCH: 32.8 pg (ref 26.0–34.0)
MCHC: 33.8 g/dL (ref 30.0–36.0)
MCV: 97.2 fL (ref 80.0–100.0)
Platelets: 148 10*3/uL — ABNORMAL LOW (ref 150–400)
RBC: 3.87 MIL/uL — ABNORMAL LOW (ref 4.22–5.81)
RDW: 12.5 % (ref 11.5–15.5)
WBC: 3.7 10*3/uL — ABNORMAL LOW (ref 4.0–10.5)
nRBC: 0 % (ref 0.0–0.2)

## 2023-07-14 LAB — VITAMIN B12: Vitamin B-12: 218 pg/mL (ref 180–914)

## 2023-07-14 LAB — MAGNESIUM: Magnesium: 1.8 mg/dL (ref 1.7–2.4)

## 2023-07-14 LAB — PHOSPHORUS: Phosphorus: 2.6 mg/dL (ref 2.5–4.6)

## 2023-07-14 LAB — VITAMIN D 25 HYDROXY (VIT D DEFICIENCY, FRACTURES): Vit D, 25-Hydroxy: 23 ng/mL — ABNORMAL LOW (ref 30–100)

## 2023-07-14 LAB — FOLATE: Folate: 9.4 ng/mL (ref >5.9)

## 2023-07-14 SURGERY — EGD (ESOPHAGOGASTRODUODENOSCOPY)
Anesthesia: Monitor Anesthesia Care

## 2023-07-14 SURGERY — EGD (ESOPHAGOGASTRODUODENOSCOPY)
Anesthesia: General

## 2023-07-14 MED ORDER — PROPOFOL 10 MG/ML IV BOLUS
INTRAVENOUS | Status: DC | PRN
  Administered 2023-07-14: 100 mg via INTRAVENOUS

## 2023-07-14 MED ORDER — SODIUM CHLORIDE 0.9 % IV SOLN
INTRAVENOUS | Status: DC
Start: 2023-07-14 — End: 2023-07-14

## 2023-07-14 MED ORDER — VITAMIN D 25 MCG (1000 UNIT) PO TABS
1000.0000 [IU] | ORAL_TABLET | Freq: Every day | ORAL | Status: DC
  Administered 2023-07-15: 1000 [IU] via ORAL
  Filled 2023-07-14: qty 1

## 2023-07-14 MED ORDER — VITAMIN B-12 1000 MCG PO TABS
1000.0000 ug | ORAL_TABLET | Freq: Every day | ORAL | Status: DC
  Administered 2023-07-15: 1000 ug via ORAL
  Filled 2023-07-14: qty 1

## 2023-07-14 MED ORDER — ONDANSETRON HCL 4 MG/2ML IJ SOLN
INTRAMUSCULAR | Status: DC | PRN
  Administered 2023-07-14: 4 mg via INTRAVENOUS

## 2023-07-14 MED ORDER — LIDOCAINE HCL (CARDIAC) PF 100 MG/5ML IV SOSY
PREFILLED_SYRINGE | INTRAVENOUS | Status: DC | PRN
  Administered 2023-07-14: 40 mg via INTRAVENOUS

## 2023-07-14 MED ORDER — GLYCOPYRROLATE 0.2 MG/ML IJ SOLN
INTRAMUSCULAR | Status: AC
  Filled 2023-07-14: qty 1

## 2023-07-14 MED ORDER — SUCCINYLCHOLINE CHLORIDE 200 MG/10ML IV SOSY
PREFILLED_SYRINGE | INTRAVENOUS | Status: DC | PRN
  Administered 2023-07-14: 120 mg via INTRAVENOUS

## 2023-07-14 MED ORDER — LIDOCAINE HCL (PF) 2 % IJ SOLN
INTRAMUSCULAR | Status: AC
  Filled 2023-07-14: qty 5

## 2023-07-14 MED ORDER — ONDANSETRON HCL 4 MG/2ML IJ SOLN
INTRAMUSCULAR | Status: AC
  Filled 2023-07-14: qty 2

## 2023-07-14 NOTE — Progress Notes (Signed)
 Progress Note   Patient: John Booker GNF:[id number] DOB: 03-Apr-1950 DOA: 07/10/2023     2 DOS: the patient was seen and examined on 07/14/2023   Brief hospital course: Mr. John Booker is a 73 year old male with history of hypertension, CKD stage II/IIIa, presents to the emergency department for chief concerns of weakness and dizziness and falling 4 days ago. Per patient daughter, patient has some dementia, also drink large amount alcohol daily.  Has been falling, but never had a syncope. He has worsening diarrhea for the last month, to the point he was incontinent of stool.  He also has severe hiccups for the past year, which has prevented him from eating. Before admission to the hospital, he was very dizzy. Upon arriving hospital, patient had a significant electrolyte abnormality with AKI.  Received IV fluids in the emergency room.   Principal Problem:   AKI (acute kidney injury) (HCC) Active Problems:   Hyponatremia   Essential hypertension   Generalized weakness   Hypokalemia   GERD without esophagitis   Alcohol dependence in sustained full remission (HCC)   Chronic diastolic CHF (congestive heart failure) (HCC)   Hypophosphatemia   Hypomagnesemia   Assessment and Plan:  AKI (acute kidney injury) (HCC) Hyponatremia. Hypokalemia Hypomagnesia. Hypophosphatemia. This appeared to be secondary to decreased p.o. intake and diarrhea.  He received IV fluids, renal function has normalized.  Discontinue IV fluids. Replete phosphate.  Potassium and magnesium  has normalized.  Sodium level normalized. Patient had significant tachycardia and borderline blood pressure, received fluid bolus.  Blood pressure is elevated now.  No need for additional IV fluids. Conditions are improved.   Chronic diarrhea. Failure to thrive. Frequent fall. Patient has chronic diarrhea, potentially from pancreatic insufficiency due to chronic alcoholism. Started Creon.  Has resolved.  PT OT recommended  nursing home placement.   Chronic hiccups. Severe distal esophageal stenosis. Patient has hiccups for the last 2 years, has been seen by PCP multiple times, could not improve with any measures. MRI of the brain 5/23 did not show any acute changes or mass. CT chest/abdomen/pelvis with contrast on 9/24 did not show any evidence of malignancy. Scheduled Compazine 10 mg 3 times a day. Patient also has significant dysphagia, obtained a barium swallow, showed severe distal esophageal stenosis.  This appeared to be the source of chronic hiccups.  GI consult obtained, EGD and esophageal dilation today.   Chronic alcoholism. Advised to quit.  Continue CIWA protocol and thiamine .   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 Continue to follow-up with PCP as outpatient.   Leukopenia with thrombocytopenia. Likely secondary to alcoholism. Improving   Essential hypertension Resuming home medicines, blood pressure running higher.     Generalized weakness With increased falls at home PT, OT, TOC Fall precautions   Hypothyroidism Mild elevation in TSH, will start Synthroid 25 mcg daily.   Bilateral foot wounds. Patient has mild wounds on the right great toe and left great and second toes.  These appear to be secondary to fall.  No evidence of infection.  Patient states that they are already much better.  No need for additional treatment.        Subjective:  Has hiccups, appetite seems to be better.  Diarrhea has improved.  Physical Exam: Vitals:   07/13/23 2030 07/14/23 0329 07/14/23 0358 07/14/23 0824  BP: (!) 165/87 (!) 156/94  (!) 155/95  Pulse: 79 97  90  Resp: 17 17  17   Temp: 97.8 F (36.6 C) 97.6 F (36.4 C)  TempSrc: Oral Oral    SpO2: 100% 98%    Weight:   87.3 kg   Height:       General exam: Appears calm and comfortable  Respiratory system: Clear to auscultation. Respiratory effort normal. Cardiovascular system: S1 & S2 heard, RRR. No JVD, murmurs, rubs, gallops or  clicks. No pedal edema. Gastrointestinal system: Abdomen is nondistended, soft and nontender. No organomegaly or masses felt. Normal bowel sounds heard. Central nervous system: Alert and oriented. No focal neurological deficits. Extremities: Symmetric 5 x 5 power. Skin: No rashes, lesions or ulcers Psychiatry: Judgement and insight appear normal. Mood & affect appropriate.    Data Reviewed:  X-ray and lab results reviewed.  Family Communication:Daughter Updated over the phone.  Disposition: Status is: Inpatient Remains inpatient appropriate because: Severity of disease, inpatient procedure.  Pending nursing home placement.     Time spent: 35 minutes  Author: Donaciano Frizzle, MD 07/14/2023 1:55 PM  For on call review [email or web address].

## 2023-07-14 NOTE — TOC Progression Note (Addendum)
 Transition of Care [facility]) - Progression Note    Patient Details  Name: MARCIN MCELHENNEY MRN: [id number] Date of Birth: Jun 09, 1950  [facility] CM/SW Contact  Loman Risk, RN Phone Number: 07/14/2023, 12:08 PM  Clinical Narrative:    Attempted to meet with patient at bedside to discuss SNF recs Patient using the restroom at this time Daughter states that he will be at bedside after 130 pm TOC to follow up this afternoon  Substance abuse resources added to AVS   Update:  patient off the floor for procedure Received message from daughter stating that patient is in  agreement to see what options are available for SNF  Existing pasrr Fl2 sent for signature Bed search initiated   Expected Discharge Plan: Home w Home Health Services Barriers to Discharge: Continued Medical Work up  Expected Discharge Plan and Services       Living arrangements for the past 2 months: Single Family Home                                       Social Determinants of Health (SDOH) Interventions SDOH Screenings   Food Insecurity: No Food Insecurity (07/11/2023)  Housing: Low Risk  (07/11/2023)  Transportation Needs: No Transportation Needs (07/11/2023)  Utilities: Not At Risk (07/11/2023)  Depression (PHQ2-9): Low Risk  (12/12/2021)  Financial Resource Strain: Low Risk  (05/07/2023)   Received from [facility]  Social Connections: Socially Isolated (07/11/2023)  Tobacco Use: Medium Risk (07/11/2023)    Readmission Risk Interventions    07/13/2023   12:44 PM  Readmission Risk Prevention Plan  Transportation Screening Complete  PCP or Specialist Appt within 5-7 Days Complete  Medication Review (RN CM) Complete

## 2023-07-14 NOTE — NC FL2 (Signed)
 [place]  MEDICAID FL2 LEVEL OF CARE FORM     IDENTIFICATION  Patient Name: John Booker Birthdate: 03/26/1950 Sex: male Admission Date (Current Location): 07/10/2023  [facility] and [place] Number:  Chiropodist and Address:         Provider Number: [id number]  Attending Physician Name and Address:  Donaciano Frizzle, MD  Relative Name and Phone Number:       Current Level of Care: Hospital Recommended Level of Care: Skilled Nursing Facility Prior Approval Number:    Date Approved/Denied:   PASRR Number: [id number]  Discharge Plan: SNF    Current Diagnoses: Patient Active Problem List   Diagnosis Date Noted   Hypophosphatemia 07/12/2023   Hypomagnesemia 07/12/2023   Weakness 12/13/2022   Serum total bilirubin elevated 12/13/2022   Sepsis (HCC) 11/23/2022   Acute metabolic encephalopathy 11/23/2022   History of MSSA bacteremia secondary to foot wounds, s/p treatment 9/13 - 12/28/21 02/18/2022   SIRS (systemic inflammatory response syndrome) (HCC) 02/18/2022   Mediastinal mass 02/18/2022   Benign paraspinal mass 11/20/2021   Staphylococcal arthritis of vertebra (HCC)    Acute renal insufficiency    Non-traumatic rhabdomyolysis    GERD without esophagitis 11/16/2021   Hypokalemia 11/16/2021   Hyponatremia 11/16/2021   Bacteremia due to methicillin susceptible Staphylococcus aureus (MSSA) 11/16/2021   AKI (acute kidney injury) (HCC) 11/15/2021   Stage 3a chronic kidney disease (HCC) 11/07/2021   Protrusion of cervical intervertebral disc 07/23/2021   Exertional dyspnea 07/04/2021   History of alcohol abuse 07/04/2021   History of tobacco abuse 07/04/2021   Chronic diastolic CHF (congestive heart failure) (HCC) 06/26/2021   Headache 06/26/2021   Low serum albumin 05/10/2021   Malnutrition of moderate degree 12/28/2020   Esophageal dysmotility 12/28/2020   Generalized weakness 12/26/2020   COVID-19 vaccination declined 03/21/2020   Overweight (BMI  25.0-29.9) 12/20/2019   Blister of skin 11/07/2019   Neuropathy 04/04/2019   Pre-ulcerative calluses 10/07/2018   Skin lesion 10/07/2018   Plantar flexed metatarsal bone of right foot 10/07/2018   Hiatal hernia    Stomach irritation    Intractable hiccups 10/16/2017   Elevated serum creatinine 07/15/2017   Bilateral lower extremity edema 05/08/2017   Alcohol abuse 03/06/2017   Alcohol dependence in sustained full remission (HCC) 03/05/2017   Essential hypertension 11/15/2015   Vitamin D  deficiency 11/15/2015    Orientation RESPIRATION BLADDER Height & Weight     Self, Situation, Place  Normal Continent Weight: 87.3 kg Height:  6\' 3"  (190.5 cm)  BEHAVIORAL SYMPTOMS/MOOD NEUROLOGICAL BOWEL NUTRITION STATUS      Continent Diet (NPO will advance prior to discharge)  AMBULATORY STATUS COMMUNICATION OF NEEDS Skin   Extensive Assist Verbally Skin abrasions, Bruising                       Personal Care Assistance Level of Assistance              Functional Limitations Info             SPECIAL CARE FACTORS FREQUENCY  PT (By licensed PT), OT (By licensed OT)                    Contractures Contractures Info: Not present    Additional Factors Info  Code Status, Allergies Code Status Info: DNR Allergies Info: Adalat (Nifedipine), Amlodipine            Current Medications (07/14/2023):  This is the current hospital active medication  list Current Facility-Administered Medications  Medication Dose Route Frequency Provider Last Rate Last Admin   acetaminophen  (TYLENOL ) tablet 650 mg  650 mg Oral Q6H PRN Cox, Amy N, DO   650 mg at 07/10/23 2041   Or   acetaminophen  (TYLENOL ) suppository 650 mg  650 mg Rectal Q6H PRN Cox, Amy N, DO       ascorbic acid  (VITAMIN C ) tablet 500 mg  500 mg Oral BID Zhang, Dekui, MD   500 mg at 07/14/23 1025   chlorproMAZINE  (THORAZINE ) injection 25 mg  25 mg Intramuscular TID PRN Patel, Pranav M, MD   25 mg at 07/11/23 1541    cholecalciferol (VITAMIN D3) 25 MCG (1000 UNIT) tablet 1,000 Units  1,000 Units Oral Daily Donaciano Frizzle, MD       cyanocobalamin  (VITAMIN B12) tablet 1,000 mcg  1,000 mcg Oral Daily Donaciano Frizzle, MD       feeding supplement (ENSURE ENLIVE / ENSURE PLUS) liquid 237 mL  237 mL Oral TID BM Zhang, Dekui, MD   237 mL at 07/13/23 2019   folic acid  (FOLVITE ) tablet 1 mg  1 mg Oral Daily Patel, Pranav M, MD   1 mg at 07/14/23 1025   heparin  injection 5,000 Units  5,000 Units Subcutaneous Q8H Cox, Amy N, DO   5,000 Units at 07/14/23 0509   hydrALAZINE  (APRESOLINE ) injection 5 mg  5 mg Intravenous Q6H PRN Cox, Amy N, DO       HYDROcodone -acetaminophen  (NORCO/VICODIN) 5-325 MG per tablet 1 tablet  1 tablet Oral Q4H PRN Patel, Pranav M, MD   1 tablet at 07/12/23 1653   lidocaine  (LIDODERM ) 5 % 1 patch  1 patch Transdermal Q24H Patel, Pranav M, MD   1 patch at 07/13/23 1703   lipase/protease/amylase (CREON) capsule 24,000 Units  24,000 Units Oral TID AC Zhang, Dekui, MD   24,000 Units at 07/14/23 1025   losartan  (COZAAR ) tablet 100 mg  100 mg Oral Daily Zhang, Dekui, MD   100 mg at 07/14/23 1025   methocarbamol (ROBAXIN) tablet 500 mg  500 mg Oral TID Patel, Pranav M, MD   500 mg at 07/14/23 1025   metoprolol  succinate (TOPROL -XL) 24 hr tablet 25 mg  25 mg Oral Daily Cox, Amy N, DO   25 mg at 07/14/23 1025   metoprolol  tartrate (LOPRESSOR ) injection 5 mg  5 mg Intravenous Q4H PRN Cox, Amy N, DO       multivitamin with minerals tablet 1 tablet  1 tablet Oral Daily Patel, Pranav M, MD   1 tablet at 07/14/23 1029   nicotine  (NICODERM CQ  - dosed in mg/24 hours) patch 21 mg  21 mg Transdermal Daily PRN Cox, Amy N, DO       ondansetron  (ZOFRAN ) tablet 4 mg  4 mg Oral Q6H PRN Cox, Amy N, DO       Or   ondansetron  (ZOFRAN ) injection 4 mg  4 mg Intravenous Q6H PRN Cox, Amy N, DO       pantoprazole  (PROTONIX ) EC tablet 40 mg  40 mg Oral BID Cox, Amy N, DO   40 mg at 07/14/23 1025   prochlorperazine (COMPAZINE) tablet 10  mg  10 mg Oral TID Zhang, Dekui, MD   10 mg at 07/14/23 1025   senna-docusate (Senokot-S) tablet 1 tablet  1 tablet Oral QHS PRN Cox, Amy N, DO       thiamine  (VITAMIN B1) tablet 100 mg  100 mg Oral Daily Patel, Pranav M, MD  100 mg at 07/14/23 1025   Or   thiamine  (VITAMIN B1) injection 100 mg  100 mg Intravenous Daily Patel, Pranav M, MD       zinc sulfate (50mg  elemental zinc) capsule 220 mg  220 mg Oral Daily Zhang, Dekui, MD   220 mg at 07/14/23 1025     Discharge Medications: Please see discharge summary for a list of discharge medications.  Relevant Imaging Results:  Relevant Lab Results:   Additional Information SS# [id number]  Loman Risk, RN

## 2023-07-14 NOTE — Consult Note (Signed)
 Consultation  Referring Provider:     Dr Jeane Miguel Admit date 07/10/23 Consult date  07/14/23       Reason for Consultation:     diarrhea/dysphagia         HPI:   John Booker is a 73 y.o. male with history of hypertension, CKD stage II/IIIa, alcoholism, chronic hiccups-Admitted for weakness/dizziness after fall- GI has been consulted for dysphagia and diarrhea. His family is at bedside. He and his family report that he usually defecated 1-2times/d (not always loose) but since he has been in hospital this has increased significantly and he is having several loose watery stools in a day- often triggered by eating. Patient attributes this to antibiotics. I do not see any listed this visit but he could have had outpatient ones recently.  Denies abdominal pain. Denies rectal bleeding/melena. It was noted patient has been coughing during meals and often the solid foods come back up. He has history of esophageal dysmotility and has been dilated in past. Last egd 2y ago. Denies nsaids. States he has been sober x 20m. Denies any other gi concerns. No known problems with sedated procedures. He is not on any anticoagulation at home.  Cbc stable, had mild elevation of transaminases on admission,.kidney function has improved. Albumin normal on admission No stool studies to date  PREVIOUS ENDOSCOPIES:      Barium swallow 5/25-   IMPRESSION: 1. Findings suspicious for a high-grade stricture of the distal esophagus extending through the gastroesophageal junction. No definite wall mass is seen on prior 11/23/2022 or 02/13/2023 CTs. On the CTs the lumen of the distal esophagus is collapsed, and findings are most suspicious for a distal gastroesophageal structure as can be seen secondary to chronic acid reflux. Consider upper endoscopy direct visualization.  2. Due to stasis of contrast within the esophagus, slow clearance into the stomach, patient discomfort, and inability to tolerate further drinks a barium,  this study was limited.       EGD 8/23- Dr Antony Baumgartner- la Grade B esophagitis, normal stomach, moderated duodenitis- path with moderate benign brunners gland hyperplasia- intecat villous architecture and no increased inflammation EGD 10/22- Dr Mamie Searles- duodenitis, gastritis, normal zline, abnormal esophageal motility, dilated. Path with nonspecific chronic duodenitis with mild inactive inflammation, gastric metaplasia, nad brunners gland hyperplasio. There was no h pylori, dysplasia, malignancy EGD 3/20- Dr Tahiliani/hiccups-  normal esophagus but thickened folds at gej; small hiatal hernia, gastritis, normal duodenum  Abdominal US  10/24- normal  Past Medical History:  Diagnosis Date   (HFpEF) heart failure with preserved ejection fraction (HCC)    a.) TTE 04/11/2021: EF 60-65%, no RWMAs, G1DD, mild LAE, RVSF norm, asc Ao 38 mm; b.) TTE 11/18/2021: EF 60-65%, no RWMAs, mild LVH, G1DD, norm RVSH; c.) TEE 11/20/2021: EF 60-65%, no RWMAs, no LAA thrombus, triv MR, no valvular vegetation, no IAS; d.) TTE 11/24/2022: EF 60-65%, no RWMAs, mod LVH, norm RVSF   Alcohol abuse, in remission    Aortic atherosclerosis (HCC)    Bacteremia due to Gram-negative bacteria (Klebsiella oxytoca)    a.) source unidentified; b.) admitted to [facility] 11/20/2021 - 12/28/2021   Bilateral lower extremity edema    BPH (benign prostatic hyperplasia)    Cholelithiasis    Chronic hiccups    CKD (chronic kidney disease), stage III (HCC)    DOE (dyspnea on exertion)    Esophageal dysmotility    GERD (gastroesophageal reflux disease)    Hemorrhoids    Hepatic cyst    History of bilateral  cataract extraction 2024   History of hiatal hernia    Hypertension    Mediastinal mass    Memory changes    Metabolic encephalopathy    a.) in the setting of bacteremia/sepsis   MSSA bacteremia 11/2021   a.) source was foot wounds and probable septic arthritis in the lumbar spine   Nephrolithiasis    Neuropathy    Non-traumatic  rhabdomyolysis    Paraspinal mass    Renal cyst, right    Sigmoid diverticulosis     Past Surgical History:  Procedure Laterality Date   BRONCHIAL WASHINGS N/A 02/16/2023   Procedure: BRONCHIAL WASHINGS;  Surgeon: Erskin Hearing, MD;  Location: [facility];  Service: Thoracic;  Laterality: N/A;   CATARACT EXTRACTION W/PHACO Left 11/13/2022   Procedure: CATARACT EXTRACTION PHACO AND INTRAOCULAR LENS PLACEMENT (IOC) LEFT OMIDRIA    13.58  01:23.3;  Surgeon: Trudi Fus, MD;  Location: [facility];  Service: Ophthalmology;  Laterality: Left;   CATARACT EXTRACTION W/PHACO Right 01/08/2023   Procedure: CATARACT EXTRACTION PHACO AND INTRAOCULAR LENS PLACEMENT (IOC) RIGHT OMIDRIA  14.23 01:16.5;  Surgeon: Trudi Fus, MD;  Location: [facility];  Service: Ophthalmology;  Laterality: Right;   ESOPHAGOGASTRODUODENOSCOPY (EGD) WITH PROPOFOL  N/A 05/26/2018   Procedure: ESOPHAGOGASTRODUODENOSCOPY (EGD) WITH PROPOFOL ;  Surgeon: Irby Mannan, MD;  Location: [facility];  Service: Endoscopy;  Laterality: N/A;   ESOPHAGOGASTRODUODENOSCOPY (EGD) WITH PROPOFOL  N/A 12/28/2020   Procedure: ESOPHAGOGASTRODUODENOSCOPY (EGD) WITH PROPOFOL ;  Surgeon: Quintin Buckle, DO;  Location: [facility];  Service: Gastroenterology;  Laterality: N/A;   ESOPHAGOGASTRODUODENOSCOPY (EGD) WITH PROPOFOL  N/A 10/09/2021   Procedure: ESOPHAGOGASTRODUODENOSCOPY (EGD) WITH PROPOFOL ;  Surgeon: Luke Salaam, MD;  Location: [facility] ENDOSCOPY;  Service: Gastroenterology;  Laterality: N/A;   FLEXIBLE BRONCHOSCOPY N/A 02/16/2023   Procedure: FLEXIBLE BRONCHOSCOPY;  Surgeon: Erskin Hearing, MD;  Location: [facility];  Service: Thoracic;  Laterality: N/A;   INCISION AND DRAINAGE PERIRECTAL ABSCESS N/A 11/19/2015   Procedure: IRRIGATION AND DEBRIDEMENT PERIRECTAL ABSCESS and debridement of scrotal abscess;  Surgeon: Claudia Cuff, MD;  Location: [facility];  Service: General;  Laterality: N/A;   INCISION  AND DRAINAGE PERIRECTAL ABSCESS N/A 11/20/2015   Procedure: IRRIGATION AND DEBRIDEMENT PERIRECTAL ABSCESS / WITH DRESSING CHANGE;  Surgeon: Claudia Cuff, MD;  Location: [facility];  Service: General;  Laterality: N/A;  peri rectal site   INCISION AND DRAINAGE PERIRECTAL ABSCESS N/A 11/21/2015   Procedure: IRRIGATION AND DEBRIDEMENT PERIRECTAL ABSCESS WITH DRESSING CHANGE;  Surgeon: Claudia Cuff, MD;  Location: [facility];  Service: General;  Laterality: N/A;   MINOR HEMORRHOIDECTOMY  1989   TEE WITHOUT CARDIOVERSION N/A 11/20/2021   Procedure: TRANSESOPHAGEAL ECHOCARDIOGRAM (TEE);  Surgeon: Michelle Aid, MD;  Location: [facility];  Service: Cardiovascular;  Laterality: N/A;  8:00am    Family History  Problem Relation Age of Onset   Hypertension Father      Social History   Tobacco Use   Smoking status: Former    Current packs/day: 0.00    Average packs/day: 0.5 packs/day for 38.0 years (19.0 ttl pk-yrs)    Types: Cigarettes    Start date: 04/10/1984    Quit date: 04/10/2022    Years since quitting: 1.2   Smokeless tobacco: Former  Building services engineer status: Never Used  Substance Use Topics   Alcohol use: Not Currently    Comment: several months,[place]   Drug use: No    Prior to Admission medications   Medication Sig Start Date End Date Taking?  Authorizing Provider  chlorpheniramine-HYDROcodone  (TUSSIONEX) 10-8 MG/5ML Take 5 mLs by mouth daily as needed for cough. 06/04/23  Yes [provider]  furosemide  (LASIX ) 20 MG tablet Take 20 mg by mouth daily.   Yes [provider]  losartan  (COZAAR ) 100 MG tablet Take 1 tablet (100 mg total) by mouth daily. 05/13/21  Yes Karamalegos, Kayleen Party, DO  metoprolol  succinate (TOPROL -XL) 25 MG 24 hr tablet Take 1 tablet (25 mg total) by mouth daily. 11/27/22  Yes Garrison Kanner, MD  ondansetron  (ZOFRAN ) 4 MG tablet Take 1 tablet (4 mg total) by mouth every 8 (eight) hours as needed for nausea or vomiting. 10/01/22  Yes Luke Salaam, MD  oxymetazoline  (NASAL RELIEF) 0.05 % nasal spray Place 1 spray into both nostrils 2 (two) times daily as needed.   Yes [provider]  potassium chloride  SA (KLOR-CON  M) 20 MEQ tablet Take 1 tablet (20 mEq total) by mouth daily. 02/19/22  Yes Luna Salinas, MD  chlorproMAZINE  (THORAZINE ) 25 MG tablet Take 1 tablet (25 mg total) by mouth 3 (three) times daily for 20 days. Take 1 tab 2-3 times daily for intractable hiccups 12/14/22 01/03/23  Tiajuana Fluke, MD    Current Facility-Administered Medications  Medication Dose Route Frequency Provider Last Rate Last Admin   acetaminophen  (TYLENOL ) tablet 650 mg  650 mg Oral Q6H PRN Cox, Amy N, DO   650 mg at 07/10/23 2041   Or   acetaminophen  (TYLENOL ) suppository 650 mg  650 mg Rectal Q6H PRN Cox, Amy N, DO       ascorbic acid  (VITAMIN C ) tablet 500 mg  500 mg Oral BID Zhang, Dekui, MD   500 mg at 07/14/23 1025   chlorproMAZINE  (THORAZINE ) injection 25 mg  25 mg Intramuscular TID PRN Patel, Pranav M, MD   25 mg at 07/11/23 1541   feeding supplement (ENSURE ENLIVE / ENSURE PLUS) liquid 237 mL  237 mL Oral TID BM Zhang, Dekui, MD   237 mL at 07/13/23 2019   folic acid  (FOLVITE ) tablet 1 mg  1 mg Oral Daily Patel, Pranav M, MD   1 mg at 07/14/23 1025   heparin  injection 5,000 Units  5,000 Units Subcutaneous Q8H Cox, Amy N, DO   5,000 Units at 07/14/23 0509   hydrALAZINE  (APRESOLINE ) injection 5 mg  5 mg Intravenous Q6H PRN Cox, Amy N, DO       HYDROcodone -acetaminophen  (NORCO/VICODIN) 5-325 MG per tablet 1 tablet  1 tablet Oral Q4H PRN Patel, Pranav M, MD   1 tablet at 07/12/23 1653   lidocaine  (LIDODERM ) 5 % 1 patch  1 patch Transdermal Q24H Patel, Pranav M, MD   1 patch at 07/13/23 1703   lipase/protease/amylase (CREON) capsule 24,000 Units  24,000 Units Oral TID AC Zhang, Dekui, MD   24,000 Units at 07/14/23 1025   LORazepam  (ATIVAN ) tablet 1-4 mg  1-4 mg Oral Q1H PRN Patel, Pranav M, MD       Or   LORazepam  (ATIVAN ) injection  1-4 mg  1-4 mg Intravenous Q1H PRN Patel, Pranav M, MD       losartan  (COZAAR ) tablet 100 mg  100 mg Oral Daily Zhang, Dekui, MD   100 mg at 07/14/23 1025   methocarbamol (ROBAXIN) tablet 500 mg  500 mg Oral TID Patel, Pranav M, MD   500 mg at 07/14/23 1025   metoprolol  succinate (TOPROL -XL) 24 hr tablet 25 mg  25 mg Oral Daily Cox, Amy N, DO   25 mg  at 07/14/23 1025   metoprolol  tartrate (LOPRESSOR ) injection 5 mg  5 mg Intravenous Q4H PRN Cox, Amy N, DO       multivitamin with minerals tablet 1 tablet  1 tablet Oral Daily Patel, Pranav M, MD   1 tablet at 07/14/23 1029   nicotine  (NICODERM CQ  - dosed in mg/24 hours) patch 21 mg  21 mg Transdermal Daily PRN Cox, Amy N, DO       ondansetron  (ZOFRAN ) tablet 4 mg  4 mg Oral Q6H PRN Cox, Amy N, DO       Or   ondansetron  (ZOFRAN ) injection 4 mg  4 mg Intravenous Q6H PRN Cox, Amy N, DO       pantoprazole  (PROTONIX ) EC tablet 40 mg  40 mg Oral BID Cox, Amy N, DO   40 mg at 07/14/23 1025   prochlorperazine (COMPAZINE) tablet 10 mg  10 mg Oral TID Donaciano Frizzle, MD   10 mg at 07/14/23 1025   senna-docusate (Senokot-S) tablet 1 tablet  1 tablet Oral QHS PRN Cox, Amy N, DO       thiamine  (VITAMIN B1) tablet 100 mg  100 mg Oral Daily Patel, Pranav M, MD   100 mg at 07/14/23 1025   Or   thiamine  (VITAMIN B1) injection 100 mg  100 mg Intravenous Daily Patel, Pranav M, MD       zinc sulfate (50mg  elemental zinc) capsule 220 mg  220 mg Oral Daily Donaciano Frizzle, MD   220 mg at 07/14/23 1025    Allergies as of 07/10/2023 - Review Complete 07/10/2023  Allergen Reaction Noted   Adalat [nifedipine]  12/05/2015   Amlodipine  Other (See Comments) 07/14/2017     Review of Systems:    All systems reviewed and negative except where noted in HPI except intermittent sob, foot ulcer, unsteady gait    Physical Exam:  Vital signs in last 24 hours: Temp:  [97.6 F (36.4 C)-98 F (36.7 C)] 97.6 F (36.4 C) (05/06 0329) Pulse Rate:  [73-97] 90 (05/06 0824) Resp:   [17-18] 17 (05/06 0824) BP: (134-165)/(76-95) 155/95 (05/06 0824) SpO2:  [98 %-100 %] 98 % (05/06 0329) Weight:  [87.3 kg] 87.3 kg (05/06 0358) Last BM Date : 07/13/23 General:   Pleasant man in NAD Head:  Normocephalic and atraumatic. Eyes:   No icterus.   Conjunctiva pink. Ears:  Normal auditory acuity. Mouth: Mucosa pink moist, no lesions. Neck:  Supple; no masses felt Lungs:  Respirations even and unlabored. Lungs clear to auscultation bilaterally.   No wheezes, crackles, or rhonchi.  Heart:  S1S2, RRR, no MRG. No edema. Abdomen:   Flat, soft, nondistended, nontender. Normal bowel sounds. No appreciable masses or hepatomegaly. No rebound signs or other peritoneal signs. Rectal:  Not performed.  Msk:  MAEW x4, No clubbing or cyanosis. Strength 5/5. Symmetrical without gross deformities. Neurologic:  Alert and  oriented x4;  Cranial nerves II-XII intact.  Skin:  Warm, dry, pink, there is scattered ecchyosis Psych:  Alert and cooperative. Normal affect.  LAB RESULTS: Recent Labs    07/12/23 0430 07/13/23 0350 07/14/23 0414  WBC 3.8* 5.2 3.7*  HGB 12.8* 12.7* 12.7*  HCT 36.8* 37.5* 37.6*  PLT 112* 146* 148*   BMET Recent Labs    07/12/23 0430 07/13/23 0350 07/14/23 0414  NA 138 135 134*  K 3.5 4.5 4.4  CL 95* 96* 97*  CO2 33* 32 31  GLUCOSE 97 95 104*  BUN 22 23 18   CREATININE 0.82  0.86 0.81  CALCIUM 8.7* 8.3* 8.8*   LFT No results for input(s): "PROT", "ALBUMIN", "AST", "ALT", "ALKPHOS", "BILITOT", "BILIDIR", "IBILI" in the last 72 hours. PT/INR No results for input(s): "LABPROT", "INR" in the last 72 hours.  STUDIES: DG ESOPHAGUS W SINGLE CM (SOL OR THIN BA) Result Date: 07/13/2023 CLINICAL DATA:  Esophageal dysphagia.  Difficulty swallowing. EXAM: ESOPHAGUS/BARIUM SWALLOW/TABLET STUDY TECHNIQUE: Single contrast examination was performed using thin liquid barium. This exam was performed by Lambert Pillion, PA-C, and was supervised and interpreted by Dr. Bertina Broccoli, MD. FLUOROSCOPY: Radiation Exposure Index (as provided by the fluoroscopic device): 22.00 mGy Kerma Total fluoroscopy time: 114 seconds COMPARISON:  DG esophagus with single contrast media on 12/27/2020; CT chest 02/13/2023, CT chest, abdomen, and pelvis 11/23/2022 FINDINGS: Please note this study waseverely limited by patient's inability to lie flat without pain, rotate side-to-side, or stand. Patient was placed at 45 degree tilt throughout exam. Swallowing: Appears normal. No vestibular penetration or aspiration seen. Pharynx: Unremarkable. Esophagus: Normal appearance. Esophageal motility: With the patient drinking in upright positioning, there was slow transit of contrast through the gastroesophageal junction, and pooling of contrast throughout the proximal 90% of the esophagus. Moderate to severe dysmotility with notably delayed emptying through gastroesophageal junction. The patient was unable to clear the barium column despite dry swallowing technique and several sips of water. There was a region measuring up to approximately 1.8 vertebral body heights in length (approximately 4.5 cm based on a vertebral body height of 2.5 cm on 02/13/2023 CT) of high-grade approximate 80% narrowing of the distal esophagus and proximal stomach. Mild diffuse circumferential esophageal wall thickening was seen throughout the esophagus on prior 02/13/2023 and 11/23/2022 CTs, greatest at the gastroesophageal junction. No oral contrast was administered for these prior CTs, however there was mild fluid within the upper esophagus on the 11/23/2022 CT chest abdomen pelvis, and on both CTs the distal esophageal walls are collapsed and apposed, without luminal fluid or air. Findings are suspicious for a stricture at the gastroesophageal junction. Due to the stasis of contrast within the esophagus and patient inability to drink more contrast, the study was aborted. Hiatal Hernia: Not seen. Gastroesophageal reflux: No definite  reflux was seen extending retrograde from the stomach to the esophagus, however contrast within the esophagus did travel "to and fro" within the esophagus. Ingested 13mm barium tablet: Not given IMPRESSION: 1. Findings suspicious for a high-grade stricture of the distal esophagus extending through the gastroesophageal junction. No definite wall mass is seen on prior 11/23/2022 or 02/13/2023 CTs. On the CTs the lumen of the distal esophagus is collapsed, and findings are most suspicious for a distal gastroesophageal structure as can be seen secondary to chronic acid reflux. Consider upper endoscopy direct visualization. 2. Due to stasis of contrast within the esophagus, slow clearance into the stomach, patient discomfort, and inability to tolerate further drinks a barium, this study was limited. Electronically Signed   By: Bertina Broccoli M.D.   On: 07/13/2023 16:17       Impression / Plan:   Diarrhea- stool pcr- would check for cdiff given frequent abx history and potential for iatrogenic contact/diarrhea increase since stay Abnormal barium swallow, esophageal dysmotility, hiccups. Possibly achalasia/EGJOO- schedule egd with possible dilation. Indications, benefit, risks discussed with him and his family( Infection, perforation, bleeding, anesthesia difficulties, etc) and they are agreeable  Thank you very much for this consult. These services were provided by Corrinne Din, NP-C, in collaboration with Deveron Fly, MD, with whom I  have discussed this patient in full.   Corrinne Din, NP-C

## 2023-07-14 NOTE — Anesthesia Preprocedure Evaluation (Signed)
 Anesthesia Evaluation  Patient identified by MRN, date of birth, ID band Patient awake  General Assessment Comment:  Patient burping/belching during preop.  Prior documented difficult airway: "Small glottic opening. Unable to pass 8.0 ETT with pt in position of comfort. Attempted Bougie without success. Head repositioned with easy pass of 8.0 ETT. " 3 total documented attempts.  Reviewed: Allergy & Precautions, NPO status , Patient's Chart, lab work & pertinent test results  History of Anesthesia Complications Negative for: history of anesthetic complications  Airway Mallampati: III  TM Distance: >3 FB Neck ROM: full    Dental no notable dental hx. (+) Dental Advisory Given   Pulmonary neg pulmonary ROS, former smoker   Pulmonary exam normal breath sounds clear to auscultation       Cardiovascular hypertension, On Medications +CHF and + DOE  Normal cardiovascular exam Rhythm:Regular Rate:Normal  Per Cardiology 01/2023 73 year old gentleman with a history of hypertension, previous tobacco use, intractable hiccups, and chronic HFpEF. Patient appears euvolemic today. He has been on Lasix  for mild peripheral edema. He has intractable hiccups x 5 days, not amendable to medications, and is scheduled for a bronchoscopy. The patient has had no recent CHF exacerbations or hospitalizations, has no history of MI, stents, or stroke. He denies chest pain, worsening dyspnea, syncope, or palpitations. Recent echocardiogram revealed normal LVEF with no significant valvular abnormalities. He is an acceptable surgical candidate, and no further cardiac diagnostics are recommended at this time preoperatively.  Plan   Continue current medications Continue Lasix  for peripheral edema Limit salt intake Recommend Mediterranean Diet Recommend weighing daily and monitoring for overnight weight gain of 2 or more pounds or 5 pounds in 1 week Proceed with  bronchoscopy Return to clinic for follow up in 6 months.      Neuro/Psych  Headaches  Neuromuscular disease  negative psych ROS   GI/Hepatic Neg liver ROS, hiatal hernia,GERD  ,,  Severely stenotic distal esophagus. Patient often has difficulty eating solids and liquids, has reflux often. Actively belching in front of me.   Endo/Other  negative endocrine ROS    Renal/GU Renal disease     Musculoskeletal   Abdominal   Peds  Hematology negative hematology ROS (+)   Anesthesia Other Findings Past Medical History: No date: (HFpEF) heart failure with preserved ejection fraction (HCC)     Comment:  a.) TTE 04/11/2021: EF 60-65%, no RWMAs, G1DD, mild LAE,              RVSF norm, asc Ao 38 mm; b.) TTE 11/18/2021: EF 60-65%,               no RWMAs, mild LVH, G1DD, norm RVSH; c.) TEE 11/20/2021:               EF 60-65%, no RWMAs, no LAA thrombus, triv MR, no               valvular vegetation, no IAS; d.) TTE 11/24/2022: EF               60-65%, no RWMAs, mod LVH, norm RVSF No date: Alcohol abuse, in remission No date: Aortic atherosclerosis (HCC) No date: Bacteremia due to Gram-negative bacteria (Klebsiella oxytoca)     Comment:  a.) source unidentified; b.) admitted to [facility] 11/20/2021              - 12/28/2021 No date: Bilateral lower extremity edema No date: BPH (benign prostatic hyperplasia) No date: Cholelithiasis No date: Chronic hiccups No date: CKD (chronic  kidney disease), stage III (HCC) No date: DOE (dyspnea on exertion) No date: Esophageal dysmotility No date: GERD (gastroesophageal reflux disease) No date: Hemorrhoids No date: Hepatic cyst 2024: History of bilateral cataract extraction No date: History of hiatal hernia No date: Hypertension No date: Mediastinal mass No date: Memory changes No date: Metabolic encephalopathy     Comment:  a.) in the setting of bacteremia/sepsis 11/2021: MSSA bacteremia     Comment:  a.) source was foot wounds and probable  septic arthritis              in the lumbar spine No date: Nephrolithiasis No date: Neuropathy No date: Non-traumatic rhabdomyolysis No date: Paraspinal mass No date: Renal cyst, right No date: Sigmoid diverticulosis  Past Surgical History: 11/13/2022: CATARACT EXTRACTION W/PHACO; Left     Comment:  Procedure: CATARACT EXTRACTION PHACO AND INTRAOCULAR               LENS PLACEMENT (IOC) LEFT OMIDRIA    13.58  01:23.3;                Surgeon: Trudi Fus, MD;  Location: [facility]               SURGERY CNTR;  Service: Ophthalmology;  Laterality: Left; 01/08/2023: CATARACT EXTRACTION W/PHACO; Right     Comment:  Procedure: CATARACT EXTRACTION PHACO AND INTRAOCULAR               LENS PLACEMENT (IOC) RIGHT OMIDRIA  14.23 01:16.5;                Surgeon: Trudi Fus, MD;  Location: [facility]               SURGERY CNTR;  Service: Ophthalmology;  Laterality:               Right; 05/26/2018: ESOPHAGOGASTRODUODENOSCOPY (EGD) WITH PROPOFOL ; N/A     Comment:  Procedure: ESOPHAGOGASTRODUODENOSCOPY (EGD) WITH               PROPOFOL ;  Surgeon: Irby Mannan, MD;  Location:               [facility] ENDOSCOPY;  Service: Endoscopy;  Laterality: N/A; 12/28/2020: ESOPHAGOGASTRODUODENOSCOPY (EGD) WITH PROPOFOL ; N/A     Comment:  Procedure: ESOPHAGOGASTRODUODENOSCOPY (EGD) WITH               PROPOFOL ;  Surgeon: Quintin Buckle, DO;  Location:              [facility] ENDOSCOPY;  Service: Gastroenterology;  Laterality:               N/A; 10/09/2021: ESOPHAGOGASTRODUODENOSCOPY (EGD) WITH PROPOFOL ; N/A     Comment:  Procedure: ESOPHAGOGASTRODUODENOSCOPY (EGD) WITH               PROPOFOL ;  Surgeon: Luke Salaam, MD;  Location: [facility]               ENDOSCOPY;  Service: Gastroenterology;  Laterality: N/A; 11/19/2015: INCISION AND DRAINAGE PERIRECTAL ABSCESS; N/A     Comment:  Procedure: IRRIGATION AND DEBRIDEMENT PERIRECTAL ABSCESS              and debridement of scrotal abscess;  Surgeon: Claudia Cuff, MD;  Location: [facility] ORS;  Service: General;                Laterality: N/A; 11/20/2015: INCISION AND DRAINAGE PERIRECTAL ABSCESS; N/A  Comment:  Procedure: IRRIGATION AND DEBRIDEMENT PERIRECTAL ABSCESS              / WITH DRESSING CHANGE;  Surgeon: Claudia Cuff, MD;                Location: [facility] ORS;  Service: General;  Laterality: N/A;               peri rectal site 11/21/2015: INCISION AND DRAINAGE PERIRECTAL ABSCESS; N/A     Comment:  Procedure: IRRIGATION AND DEBRIDEMENT PERIRECTAL ABSCESS              WITH DRESSING CHANGE;  Surgeon: Claudia Cuff, MD;                Location: [facility] ORS;  Service: General;  Laterality: N/A; 1989: MINOR HEMORRHOIDECTOMY 11/20/2021: TEE WITHOUT CARDIOVERSION; N/A     Comment:  Procedure: TRANSESOPHAGEAL ECHOCARDIOGRAM (TEE);                Surgeon: Michelle Aid, MD;  Location: [facility] ORS;                Service: Cardiovascular;  Laterality: N/A;  8:00am  BMI    Body Mass Index: 25.12 kg/m      Reproductive/Obstetrics negative OB ROS                             Anesthesia Physical Anesthesia Plan  ASA: 3  Anesthesia Plan: General   Post-op Pain Management:    Induction: Intravenous and Rapid sequence  PONV Risk Score and Plan: 2 and Ondansetron , Dexamethasone  and Treatment may vary due to age or medical condition  Airway Management Planned: Oral ETT and Video Laryngoscope Planned  Additional Equipment: None  Intra-op Plan:   Post-operative Plan: Extubation in OR  Informed Consent: I have reviewed the patients History and Physical, chart, labs and discussed the procedure including the risks, benefits and alternatives for the proposed anesthesia with the patient or authorized representative who has indicated his/her understanding and acceptance.     Dental Advisory Given  Plan Discussed with: Anesthesiologist, CRNA and Surgeon  Anesthesia Plan Comments: (Plan for RSI. Discussed  risks of anesthesia with patient and his adult daughter at bedside, including PONV, sore throat, lip/dental/eye damage, aspiration. Rare risks discussed as well, such as cardiorespiratory and neurological sequelae, and allergic reactions. Discussed the role of CRNA in patient's perioperative care. Patient understands.)       Anesthesia Quick Evaluation

## 2023-07-14 NOTE — Anesthesia Postprocedure Evaluation (Signed)
 Anesthesia Post Note  Patient: John Booker  Procedure(s) Performed: EGD (ESOPHAGOGASTRODUODENOSCOPY) Balloon dilation wire-guided  Patient location during evaluation: PACU Anesthesia Type: General Level of consciousness: awake and alert, oriented and patient cooperative Pain management: pain level controlled Vital Signs Assessment: post-procedure vital signs reviewed and stable Respiratory status: spontaneous breathing, nonlabored ventilation and respiratory function stable Cardiovascular status: blood pressure returned to baseline and stable Postop Assessment: adequate PO intake Anesthetic complications: no   No notable events documented.   Last Vitals:  Vitals:   07/14/23 1531 07/14/23 1541  BP: (!) 154/89 (!) 150/92  Pulse: 87 87  Resp: (!) 23 (!) 22  Temp:    SpO2: 97% 94%    Last Pain:  Vitals:   07/14/23 1541  TempSrc:   PainSc: 1                  Dorothey Gate

## 2023-07-14 NOTE — Anesthesia Procedure Notes (Signed)
 Procedure Name: Intubation Date/Time: 07/14/2023 2:59 PM  Performed by: Heriberto London, CRNAPre-anesthesia Checklist: Patient identified, Patient being monitored, Timeout performed, Emergency Drugs available and Suction available Patient Re-evaluated:Patient Re-evaluated prior to induction Oxygen Delivery Method: Circle system utilized Preoxygenation: Pre-oxygenation with 100% oxygen Induction Type: IV induction, Rapid sequence and Cricoid Pressure applied Laryngoscope Size: 3 and McGrath Grade View: Grade I Tube type: Oral Tube size: 7.0 mm Number of attempts: 1 Airway Equipment and Method: Stylet Placement Confirmation: ETT inserted through vocal cords under direct vision, positive ETCO2 and breath sounds checked- equal and bilateral Secured at: 22 cm Tube secured with: Tape Dental Injury: Teeth and Oropharynx as per pre-operative assessment  Comments: Smooth atraumatic intubation, no complications noted.

## 2023-07-14 NOTE — Progress Notes (Signed)
 Initial Nutrition Assessment  DOCUMENTATION CODES:   Not applicable  INTERVENTION:   Ensure Max protein supplement po BID with diet advancement, each supplement provides 150kcal and 30g of protein.  MVI, folic acid  and thiamine  po daily   Cholecalciferol 1,000 units po daily x 30 days   Cyanocobalamin  1,000 units po daily x 30 days   Vitamin C  500mg  po BID x 30 days  Zinc 220mg  po daily x 14 days  Check vitamins A, folate, B12, E, zinc and D labs  NUTRITION DIAGNOSIS:   Inadequate oral intake related to dysphagia as evidenced by per patient/family report.  GOAL:   Patient will meet greater than or equal to 90% of their needs  MONITOR:   PO intake, Supplement acceptance, Labs, Weight trends, I & O's, Skin  REASON FOR ASSESSMENT:   Malnutrition Screening Tool    ASSESSMENT:   73 y/o male with h/o dementia, HTN, etoh abuse, CHF, GERD, hiatal hernia, esophageal dysmotility, CKD III, BPH, chronic hiccups and chronic diarrhea who is admitted with frequent falls, FTT, AKI, diarrhea, dysphagia secondary to esophageal stricture and hiccups.  Pt s/p EGD today; noted to have dilated proximal and mid esophagus, hypertonic lower esophageal sphincter (dilated), abnormal esophageal motility and erythematous   Met with pt and pt's daughter in room today. Pt sitting up in chair and is irritable today awaiting EGD. Pt reporting that he is starving. Pt is a poor historian so history provided by pt and pt's daughter. Pt reports difficulty with swallowing and hiccups for the past 5 years; this has been progressively getting worse. Pt reports that he had to have his throat "stretched" in the past. Pt reports that for the past month he has been having weakness, diarrhea and falls. Pt reports that he has been choking on liquids and solids and reports the inability to keep anything down but water. Pt s/p DG esophagus yesterday and was noted to have esophageal stricture. Pt underwent EGD today  with dilation. RD discussed with pt the importance of adequate nutrition needed to preserve lean muscle. Pt reports that he has been drinking chocolate muscle milk at home; pt is agreeable to try Ensure Max in hospital. RD will add supplements and vitamins to help pt meet his estimated needs and to support wound healing. Of note, pt with numerous vitamin deficiencies noted in the past; will recheck labs this admission as deficiencies can cause diarrhea, weakness, falls and poor wound healing. Pt reports diarrhea for the past month; creon initiated. Diarrhea seems improved in hospital.   Pt reports a recent 40lb weight loss. Pt does appear to be down ~30lbs over the past 2 years. Per chart, pt is down ~16lbs(8%) over the past 8 months; this is not significant.   Medications reviewed and include: vitamin C , D3, B12, folic acid , heparin , creon, MVI, protonix , thiamine , zinc  Labs reviewed: Na 134(L), K 4.4 wnl, P 2.6 wnl, Mg 1.8 wnl Vitamin D - 23.0(L), B12- 218 wnl, folate 9.4 wnl- 5/6 Wbc- 3.7(L)  NUTRITION - FOCUSED PHYSICAL EXAM:  Flowsheet Row Most Recent Value  Orbital Region No depletion  Upper Arm Region Mild depletion  Thoracic and Lumbar Region No depletion  Buccal Region No depletion  Temple Region Mild depletion  Clavicle Bone Region Mild depletion  Clavicle and Acromion Bone Region Mild depletion  Scapular Bone Region No depletion  Dorsal Hand Mild depletion  Patellar Region Mild depletion  Anterior Thigh Region Mild depletion  Posterior Calf Region Mild depletion  Edema (RD Assessment)  Moderate  Hair Reviewed  Eyes Reviewed  Mouth Reviewed  Skin Reviewed  Nails Reviewed   Diet Order:   Diet Order             Diet NPO time specified  Diet effective now                  EDUCATION NEEDS:   Education needs have been addressed  Skin:  Skin Assessment: Reviewed RN Assessment (bilateral foot wounds, ecchymosis)  Last BM:  5/5- type 4  Height:   Ht Readings  from Last 1 Encounters:  07/10/23 6\' 3"  (1.905 m)    Weight:   Wt Readings from Last 1 Encounters:  07/14/23 87.3 kg    Ideal Body Weight:  89 kg  BMI:  Body mass index is 24.06 kg/m.  Estimated Nutritional Needs:   Kcal:  2200-2500kcal/day  Protein:  110-125g/day  Fluid:  2.1-2.4L/day  Torrance Freestone MS, RD, LDN If unable to be reached, please send secure chat to "RD inpatient" available from 8:00a-4:00p daily

## 2023-07-14 NOTE — Transfer of Care (Signed)
 Immediate Anesthesia Transfer of Care Note  Patient: John Booker  Procedure(s) Performed: EGD (ESOPHAGOGASTRODUODENOSCOPY) Balloon dilation wire-guided  Patient Location: Endoscopy Unit  Anesthesia Type:General  Level of Consciousness: awake, alert , and drowsy  Airway & Oxygen Therapy: Patient Spontanous Breathing and Patient connected to nasal cannula oxygen  Post-op Assessment: Report given to RN and Post -op Vital signs reviewed and stable  Post vital signs: Reviewed and stable  Last Vitals:  Vitals Value Taken Time  BP 174/87 07/14/23 1522  Temp 35.9 1523  Pulse 87 07/14/23 1528  Resp 25 07/14/23 1528  SpO2 98 % 07/14/23 1528  Vitals shown include unfiled device data.  Last Pain:  Vitals:   07/14/23 1444  TempSrc: Temporal  PainSc:          Complications: No notable events documented.

## 2023-07-14 NOTE — Op Note (Signed)
 [facility] Patient Name: John Booker Procedure Date: 07/14/2023 2:46 PM MRN: [id number] Account #: [id number] Date of Birth: August 08, 1950 Admit Type: Inpatient Age: 73 Room: [facility] ENDO ROOM 1 Gender: Male Note Status: Finalized Instrument Name: Upper Endoscope [id number] Procedure:             Upper GI endoscopy Indications:           Abnormal cine-esophagram Providers:             Leida Puna MD, MD Medicines:             Monitored Anesthesia Care Complications:         No immediate complications. Estimated blood loss:                         Minimal. Procedure:             Pre-Anesthesia Assessment:                        - Prior to the procedure, a History and Physical was                         performed, and patient medications and allergies were                         reviewed. The patient is competent. The risks and                         benefits of the procedure and the sedation options and                         risks were discussed with the patient. All questions                         were answered and informed consent was obtained.                         Patient identification and proposed procedure were                         verified by the physician, the nurse, the                         anesthesiologist, the anesthetist and the technician                         in the endoscopy suite. Mental Status Examination:                         alert and oriented. Airway Examination: normal                         oropharyngeal airway and neck mobility. Respiratory                         Examination: clear to auscultation. CV Examination:                         normal. Prophylactic Antibiotics: The patient does not  require prophylactic antibiotics. Prior                         Anticoagulants: The patient has taken no anticoagulant                         or antiplatelet agents. ASA Grade Assessment: III -  A                         patient with severe systemic disease. After reviewing                         the risks and benefits, the patient was deemed in                         satisfactory condition to undergo the procedure. The                         anesthesia plan was to use general anesthesia.                         Immediately prior to administration of medications,                         the patient was re-assessed for adequacy to receive                         sedatives. The heart rate, respiratory rate, oxygen                         saturations, blood pressure, adequacy of pulmonary                         ventilation, and response to care were monitored                         throughout the procedure. The physical status of the                         patient was re-assessed after the procedure.                        After obtaining informed consent, the endoscope was                         passed under direct vision. Throughout the procedure,                         the patient's blood pressure, pulse, and oxygen                         saturations were monitored continuously. The Endoscope                         was introduced through the mouth, and advanced to the                         second part of duodenum. The upper GI endoscopy was  accomplished without difficulty. The patient tolerated                         the procedure well. Findings:      The lumen of the proximal esophagus and mid esophagus was moderately       dilated.      A hypertonic lower esophageal sphincter was found. A TTS dilator was       passed through the scope. Dilation with a 12-13.5-15 mm balloon dilator       was performed to 15 mm. The dilation site was examined and showed mild       mucosal disruption. Estimated blood loss was minimal.      Abnormal motility was noted in the esophagus. There is a decrease in       motility of the esophageal body. The distal  esophagus/lower esophageal       sphincter is spastic, but gives up passage to the endoscope.      The entire examined stomach was normal.      Patchy moderately erythematous mucosa without active bleeding and with       no stigmata of bleeding was found in the duodenal bulb. Impression:            - Dilation in the proximal esophagus and in the mid                         esophagus.                        - Hypertonic lower esophageal sphincter. Dilated.                        - Abnormal esophageal motility.                        - Normal stomach.                        - Erythematous duodenopathy.                        - No specimens collected. Recommendation:        - Return patient to hospital ward for ongoing care.                        - Resume previous diet.                        - Continue present medications.                        - Perform ambulatory esophageal manometry at                         appointment to be scheduled. Procedure Code(s):     --- Professional ---                        [phone], Esophagogastroduodenoscopy, flexible,                         transoral; with transendoscopic balloon dilation of  esophagus (less than 30 mm diameter) Diagnosis Code(s):     --- Professional ---                        K22.89, Other specified disease of esophagus                        K22.4, Dyskinesia of esophagus                        K31.89, Other diseases of stomach and duodenum                        R93.3, Abnormal findings on diagnostic imaging of                         other parts of digestive tract CPT copyright 2022 [employer]. All rights reserved. The codes documented in this report are preliminary and upon coder review may  be revised to meet current compliance requirements. Leida Puna MD, MD 07/14/2023 3:19:56 PM Number of Addenda: 0 Note Initiated On: 07/14/2023 2:46 PM Estimated Blood Loss:  Estimated blood loss  was minimal.      [facility]

## 2023-07-14 NOTE — Plan of Care (Signed)
   Problem: Nutrition: Goal: Adequate nutrition will be maintained Outcome: Progressing   Problem: Pain Managment: Goal: General experience of comfort will improve and/or be controlled Outcome: Progressing   Problem: Safety: Goal: Ability to remain free from injury will improve Outcome: Progressing

## 2023-07-14 NOTE — Discharge Instructions (Signed)

## 2023-07-15 ENCOUNTER — Encounter: Payer: Self-pay | Admitting: Gastroenterology

## 2023-07-15 DIAGNOSIS — K220 Achalasia of cardia: Secondary | ICD-10-CM | POA: Diagnosis not present

## 2023-07-15 DIAGNOSIS — N179 Acute kidney failure, unspecified: Secondary | ICD-10-CM | POA: Diagnosis not present

## 2023-07-15 DIAGNOSIS — F1021 Alcohol dependence, in remission: Secondary | ICD-10-CM | POA: Diagnosis not present

## 2023-07-15 DIAGNOSIS — E871 Hypo-osmolality and hyponatremia: Secondary | ICD-10-CM

## 2023-07-15 DIAGNOSIS — I5032 Chronic diastolic (congestive) heart failure: Secondary | ICD-10-CM

## 2023-07-15 DIAGNOSIS — I10 Essential (primary) hypertension: Secondary | ICD-10-CM

## 2023-07-15 DIAGNOSIS — R531 Weakness: Secondary | ICD-10-CM

## 2023-07-15 DIAGNOSIS — Z66 Do not resuscitate: Secondary | ICD-10-CM

## 2023-07-15 LAB — ZINC: Zinc: 48 ug/dL (ref 44–115)

## 2023-07-15 MED ORDER — ONDANSETRON HCL 4 MG PO TABS: 4.0000 mg | ORAL_TABLET | Freq: Four times a day (QID) | ORAL | Status: AC | PRN

## 2023-07-15 MED ORDER — HYDROCODONE-ACETAMINOPHEN 5-325 MG PO TABS: 1.0000 | ORAL_TABLET | ORAL | 0 refills | Status: DC | PRN

## 2023-07-15 MED ORDER — VITAMIN D3 25 MCG PO TABS: 1000.0000 [IU] | ORAL_TABLET | Freq: Every day | ORAL | Status: DC

## 2023-07-15 MED ORDER — ASCORBIC ACID 500 MG PO TABS: 500.0000 mg | ORAL_TABLET | Freq: Two times a day (BID) | ORAL | Status: DC

## 2023-07-15 MED ORDER — METHOCARBAMOL 500 MG PO TABS: 500.0000 mg | ORAL_TABLET | Freq: Three times a day (TID) | ORAL | Status: DC | PRN

## 2023-07-15 MED ORDER — VITAMIN B-1 100 MG PO TABS: 100.0000 mg | ORAL_TABLET | Freq: Every day | ORAL | Status: AC

## 2023-07-15 MED ORDER — CYANOCOBALAMIN 1000 MCG PO TABS: 1000.0000 ug | ORAL_TABLET | Freq: Every day | ORAL | Status: AC

## 2023-07-15 MED ORDER — PROCHLORPERAZINE MALEATE 10 MG PO TABS: 10.0000 mg | ORAL_TABLET | Freq: Three times a day (TID) | ORAL | Status: AC | PRN

## 2023-07-15 MED ORDER — FOLIC ACID 1 MG PO TABS: 1.0000 mg | ORAL_TABLET | Freq: Every day | ORAL | Status: AC

## 2023-07-15 MED ORDER — PANTOPRAZOLE SODIUM 40 MG PO TBEC
40.0000 mg | DELAYED_RELEASE_TABLET | Freq: Two times a day (BID) | ORAL | Status: AC
Start: 2023-07-15 — End: 2023-08-14

## 2023-07-15 MED ORDER — ENSURE MAX PROTEIN PO LIQD
11.0000 [oz_av] | Freq: Two times a day (BID) | ORAL | Status: DC
  Administered 2023-07-15: 11 [oz_av] via ORAL
  Filled 2023-07-15: qty 330

## 2023-07-15 MED ORDER — ZINC SULFATE 220 (50 ZN) MG PO CAPS
220.0000 mg | ORAL_CAPSULE | Freq: Every day | ORAL | Status: AC
Start: 2023-07-16 — End: 2023-08-15

## 2023-07-15 MED ORDER — PANCRELIPASE (LIP-PROT-AMYL) 24000-76000 UNITS PO CPEP: 24000.0000 [IU] | ORAL_CAPSULE | Freq: Three times a day (TID) | ORAL | Status: AC

## 2023-07-15 NOTE — Assessment & Plan Note (Signed)
 Patient made DNR/DNI on admission.

## 2023-07-15 NOTE — Subjective & Objective (Signed)
 He seen and examined.  Awaiting nursing home placement.  Able to swallow pills this morning without difficulty.

## 2023-07-15 NOTE — Assessment & Plan Note (Addendum)
 07-15-2023 had esophageal dilatation during EGD on 07-14-2023. Pt can swallow normally now. Pt to remains on dysphagia-2 diet(minced meat/finely chopped).

## 2023-07-15 NOTE — Assessment & Plan Note (Signed)
 07-15-2023 stable. On cozaar , toprol -XL.

## 2023-07-15 NOTE — Progress Notes (Signed)
 Speech Language Pathology Treatment: Dysphagia  Patient Details Name: John Booker MRN: [id number] DOB: October 01, 1950 Today's Date: 07/15/2023 Time: 4098-1191 SLP Time Calculation (min) (ACUTE ONLY): 35 min  Assessment / Plan / Recommendation Clinical Impression  Pt seen for ongoing assessment of swallowing. He is alert, verbally responsive and engaged w/ SLP to take sips/bites in order to upgrade his current diet consistency to a Regular diet consistency.  Pt is on RA; wbc wnl. OF NOTE: Per GI EGD procedure yesterday, 07/14/23, Esophageal phase Dysmotility was noted w/ "Dilation in the proximal esophagus and in the mid esophagus. Hypertonic lower esophageal sphincter. Dilated. Abnormal esophageal motility.". Dtr stated he has been dilated previously.  Pt explained general aspiration precautions and agreed verbally to the need for following them especially sitting upright for all oral intake -- supported behind the back for full upright sitting. He fed himself trials of soft solids and thin liquids via cup. NO overt clinical s/s of aspiration were noted w/ any consistency; respiratory status remained calm and unlabored, vocal quality clear b/t trials, no coughing. Oral phase appeared grossly [facility] for bolus management and timely A-P transfer for swallowing; oral clearing achieved w/ all consistencies. Dtr denied any deficits in swallowing at lunch today as well. Pt had No c/o Esophageal phase discomfort w/ the bites/sips consumed.   Pt appears at reduced risk for aspriation from an oropharyngeal phase standpoint when following general aspiration precautions. Pt could be at risk for aspiration of REFLUX material from an Esophageal phase standpoint d/t the Esophageal phase Dysmotility dx'd Baseline. See GI report.   Recommend Regular/mech soft diet for ease of moist/soft foods Cut into Small pieces; gravies added to moisten foods; Thin liquids via Cup. NO Straws d/t air swallowed. Recommend general aspiration  and REFLUX precautions; Pills Whole in Puree; tray setup and positioning assistance for meals. REFLUX precautions d/t pt's Baseline. F/u w/ GI for further management of Esophageal phase Dysmotility. Dtr agreed. ST services will sign off at this time w/ MD to reconsult if needed while admitted. NSG updated. Precautions posted at bedside, chart.      HPI HPI: Per H&P, " Mr. John Booker is a 73 year old male with history of hypertension, CKD stage II/IIIa, presents to the emergency department for chief concerns of weakness and dizziness and falling 4 days ago. Vitals in the ED showed temperature of 98.1, respiration rate 19, heart rate 91, blood pressure 146/97, SpO2 100% on room air. Serum sodium is 131, potassium 3.2, chloride 88, bicarb 22, BUN of 34, serum creatinine 1.28, EGFR 59, nonfasting blood glucose 159, WBC 9.9, hemoglobin 15.1, platelets 177. High sensitive troponin was 18 and on repeat was 17.  CK was 155. UA was negative for leukocytes and nitrates.  Beta hydroxybutyrate was elevated at 396. BNP was elevated at 541.5." DG Chest 07/11/23: No active disease. Head CT 07/10/23: "No acute intracranial process. Soft tissue swelling overlying the left frontal region.".    EGD by GI on 07/14/2023: "Dilation in the proximal esophagus and in the mid esophagus. - Hypertonic lower esophageal sphincter. Dilated. - Abnormal esophageal motility.".      SLP Plan  All goals met      Recommendations for follow up therapy are one component of a multi-disciplinary discharge planning process, led by the attending physician.  Recommendations may be updated based on patient status, additional functional criteria and insurance authorization.    Recommendations  Diet recommendations: Regular;Thin liquid (cut meats, moistened foods) Liquids provided via: Cup;No straw Medication Administration: Whole  meds with puree Supervision: Patient able to self feed;Intermittent supervision to cue for compensatory strategies  (setup) Compensations: Minimize environmental distractions;Slow rate;Small sips/bites;Lingual sweep for clearance of pocketing;Follow solids with liquid Postural Changes and/or Swallow Maneuvers: Out of bed for meals;Seated upright 90 degrees;Upright 30-60 min after meal (REFLUX precs.)                 (GI f/u; Dietician) Oral care BID;Oral care before and after PO;Patient independent with oral care   Intermittent Supervision/Assistance Dysphagia, unspecified (R13.10) (Chronic Esophageal dysphagia with potential impact on pharyngeal function)     All goals met      Darla Edward, MS, CCC-SLP Speech Language Pathologist Rehab Services; [facility] [place] [phone] (ascom) [place]  07/15/2023, 5:08 PM

## 2023-07-15 NOTE — Assessment & Plan Note (Signed)
 07-15-2023 continue bid protonix .

## 2023-07-15 NOTE — TOC Progression Note (Signed)
 Transition of Care [facility]) - Progression Note    Patient Details  Name: John Booker MRN: [id number] Date of Birth: 1950/07/16  Transition of [facility]) CM/SW Contact  Loman Risk, RN Phone Number: 07/15/2023, 2:53 PM  Clinical Narrative:     Met with patient at bedside. CMS Medicare.gov Compare Post Acute Care list reviewed with patient.  Patient defers to daughter to make decision.  Spoke with daughter by phone.  She accepts bed at [facility].  Accepted in HUB and notified Tammay at [facility]. Per MD patient medically ready for auth to be initiated.  Requested that Nitchia with TOC to start auth  Expected Discharge Plan: Home w Home Health Services Barriers to Discharge: Continued Medical Work up  Expected Discharge Plan and Services       Living arrangements for the past 2 months: Single Family Home                                       Social Determinants of Health (SDOH) Interventions SDOH Screenings   Food Insecurity: No Food Insecurity (07/11/2023)  Housing: Low Risk  (07/11/2023)  Transportation Needs: No Transportation Needs (07/11/2023)  Utilities: Not At Risk (07/11/2023)  Depression (PHQ2-9): Low Risk  (12/12/2021)  Financial Resource Strain: Low Risk  (05/07/2023)   Received from [facility]  Social Connections: Socially Isolated (07/11/2023)  Tobacco Use: Medium Risk (07/14/2023)    Readmission Risk Interventions    07/13/2023   12:44 PM  Readmission Risk Prevention Plan  Transportation Screening Complete  PCP or Specialist Appt within 5-7 Days Complete  Medication Review (RN CM) Complete

## 2023-07-15 NOTE — Assessment & Plan Note (Signed)
 07-11-2023 through 07-14-2023 This appeared to be secondary to decreased p.o. intake and diarrhea.  He received IV fluids, renal function has normalized.  Discontinue IV fluids. Replete phosphate.  Potassium and magnesium  has normalized.  Sodium level normalized. Patient had significant tachycardia and borderline blood pressure, received fluid bolus.  Blood pressure is elevated now.  No need for additional IV fluids. Conditions are improved.

## 2023-07-15 NOTE — Care Management Important Message (Signed)
 Important Message  Patient Details  Name: BILLYJO HIDALGO MRN: [id number] Date of Birth: [birth year]   Important Message Given:  Yes - Medicare IM     Anise Kerns 07/15/2023, 2:07 PM

## 2023-07-15 NOTE — TOC Transition Note (Signed)
 Transition of Care [facility]) - Discharge Note   Patient Details  Name: John Booker MRN: [id number] Date of Birth: 03/31/1950  [facility]) CM/SW Contact:  Loman Risk, RN Phone Number: 07/15/2023, 3:45 PM   Clinical Narrative:     Patient will DC to: [facility] Anticipated DC date: 07/15/23  Family notified: Daughter Transport by: daughter  Per MD patient ready for DC to . RN, patient, patient's family, and facility notified of DC. Discharge Summary sent to facility. RN given number for report. DC packet on chartTOC signing off.     Barriers to Discharge: Continued Medical Work up   Patient Goals and CMS Choice            Discharge Placement                       Discharge Plan and Services Additional resources added to the After Visit Summary for                                       Social Drivers of Health (SDOH) Interventions SDOH Screenings   Food Insecurity: No Food Insecurity (07/11/2023)  Housing: Low Risk  (07/11/2023)  Transportation Needs: No Transportation Needs (07/11/2023)  Utilities: Not At Risk (07/11/2023)  Depression (PHQ2-9): Low Risk  (12/12/2021)  Financial Resource Strain: Low Risk  (05/07/2023)   Received from [facility]  Social Connections: Socially Isolated (07/11/2023)  Tobacco Use: Medium Risk (07/14/2023)     Readmission Risk Interventions    07/13/2023   12:44 PM  Readmission Risk Prevention Plan  Transportation Screening Complete  PCP or Specialist Appt within 5-7 Days Complete  Medication Review (RN CM) Complete

## 2023-07-15 NOTE — Progress Notes (Signed)
 PROGRESS NOTE    John Booker  VOZ:[id number] DOB: Feb 15, 1951 DOA: 07/10/2023 PCP: Monique Ano, MD  Subjective: He seen and examined.  Awaiting nursing home placement.  Able to swallow pills this morning without difficulty.   Hospital Course: HPI: John Booker is a 73 year old male with history of hypertension, CKD stage II/IIIa, presents to the emergency department for chief concerns of weakness and dizziness and falling 4 days ago.   Vitals in the ED showed temperature of 98.1, respiration rate 19, heart rate 91, blood pressure 146/97, SpO2 100% on room air.   Serum sodium is 131, potassium 3.2, chloride 88, bicarb 22, BUN of 34, serum creatinine 1.28, EGFR 59, nonfasting blood glucose 159, WBC 9.9, hemoglobin 15.1, platelets 177.   High sensitive troponin was 18 and on repeat was 17.  CK was 155.   UA was negative for leukocytes and nitrates.  Beta hydroxybutyrate was elevated at 396.   BNP was elevated at 541.5.   ED treatment: Sodium chloride  250 mL bolus, and diltiazem  10 mg IV one-time dose. -------------------------------------- At bedside, he is able to tell me his name, age, location, current calendar year.   He fell today and does not remember how he fell.   He reports he may have lost consiciousness after falling. He is not sure.    He states that it was difficult for him to finish dinner because he cannot eat while laying down.   He reports he has baseline swelling of his lower legs for so long he cannot remember when it started.  Per patient daughter, patient has some dementia, also drink large amount alcohol daily.  Has been falling, but never had a syncope. He has worsening diarrhea for the last month, to the point he was incontinent of stool.  He also has severe hiccups for the past year, which has prevented him from eating. Before admission to the hospital, he was very dizzy. Upon arriving hospital, patient had a significant electrolyte abnormality  with AKI.  Received IV fluids in the emergency room.  Significant Events: Admitted 07/10/2023 for AKI   Significant Labs: WBC 9.9, HgB 15.1, plt 177 Na 131, K 3.2, CO2 of 23, BUN 34, Scr 1.28, glu 157 BNP 541 TSH 4.64, FT4 1.15 BHA 3.96 CK 155  Significant Imaging Studies: CXR No active disease  CT head No acute intracranial process  2. Soft tissue swelling overlying the left frontal region. CT c-spine  No acute or healing fractures of the cervical spine. 2. Ankylosis of the cervical and upper thoracic spine. 3. Ossification of posterior longitudinal ligament contributes to central canal stenosis at C2-3 and most significantly at C3-4. 4. Severe central canal stenosis at C3-4 where the canal is narrowed to 5 mm. 5. Uncovertebral spurring contributes to foraminal stenosis on the left at C3-4 and C5-6 and on the right at C4-5. 6. Dilated and fluid-filled upper thoracic esophagus. No discrete lesion is present. Esophogram  Findings suspicious for a high-grade stricture of the distal esophagus extending through the gastroesophageal junction. No definite wall mass is seen on prior 11/23/2022 or 02/13/2023 CTs. On the CTs the lumen of the distal esophagus is collapsed, and findings are most suspicious for a distal gastroesophageal structure as can be seen secondary to chronic acid reflux. Consider upper  endoscopy direct visualization. 2. Due to stasis of contrast within the esophagus, slow clearance into the stomach, patient discomfort, and inability to tolerate further drinks a barium, this study was limited.  Antibiotic Therapy: Anti-infectives (  From admission, onward)    None       Procedures: 07-14-2023 EGD with esophageal dilatation  Consultants: GI    Assessment and Plan: * AKI (acute kidney injury) (HCC) 07-11-2023 through 07-14-2023 This appeared to be secondary to decreased p.o. intake and diarrhea.  He received IV fluids, renal function has normalized.  Discontinue IV  fluids. Replete phosphate.  Potassium and magnesium  has normalized.  Sodium level normalized. Patient had significant tachycardia and borderline blood pressure, received fluid bolus.  Blood pressure is elevated now.  No need for additional IV fluids. Conditions are improved.  Hyponatremia 07-11-2023 through 07-14-2023 This appeared to be secondary to decreased p.o. intake and diarrhea.  He received IV fluids, renal function has normalized.  Discontinue IV fluids. Replete phosphate.  Potassium and magnesium  has normalized.  Sodium level normalized. Patient had significant tachycardia and borderline blood pressure, received fluid bolus.  Blood pressure is elevated now.  No need for additional IV fluids. Conditions are improved.  Acquired achalasia of esophagus - s/p esophageal dilatation 07-14-2023 07-15-2023 had esophageal dilatation during EGD on 07-14-2023. Pt can swallow normally now.  DNR (do not resuscitate)/DNI(Do NOT Intubate) Patient made DNR/DNI on admission.  Hypomagnesemia 07-11-2023 through 07-14-2023 This appeared to be secondary to decreased p.o. intake and diarrhea.  He received IV fluids, renal function has normalized.  Discontinue IV fluids. Replete phosphate.  Potassium and magnesium  has normalized.  Sodium level normalized. Patient had significant tachycardia and borderline blood pressure, received fluid bolus.  Blood pressure is elevated now.  No need for additional IV fluids. Conditions are improved.  Hypophosphatemia 07-11-2023 through 07-14-2023 This appeared to be secondary to decreased p.o. intake and diarrhea.  He received IV fluids, renal function has normalized.  Discontinue IV fluids. Replete phosphate.  Potassium and magnesium  has normalized.  Sodium level normalized. Patient had significant tachycardia and borderline blood pressure, received fluid bolus.  Blood pressure is elevated now.  No need for additional IV fluids. Conditions are  improved.  Hypokalemia 07-11-2023 through 07-14-2023 This appeared to be secondary to decreased p.o. intake and diarrhea.  He received IV fluids, renal function has normalized.  Discontinue IV fluids. Replete phosphate.  Potassium and magnesium  has normalized.  Sodium level normalized. Patient had significant tachycardia and borderline blood pressure, received fluid bolus.  Blood pressure is elevated now.  No need for additional IV fluids. Conditions are improved.  GERD without esophagitis 07-15-2023 continue bid protonix .  Chronic diastolic CHF (congestive heart failure) (HCC) 07-15-2023 stable. On cozaar , toprol -XL.  Generalized weakness 07-11-2023 through 07-14-2023 With increased falls at home.  PT, OT, TOC. Fall precautions  07-15-2023 pt will need SNF placement at discharge.  Alcohol dependence in sustained full remission (HCC) 07-15-2023 pt was on CIWA protocol. Now stopped.   Essential hypertension 07-15-2023  continue metoprolol  succinate 25 mg daily resumed  DVT prophylaxis: heparin  injection 5,000 Units Start: 07/10/23 2200 Place TED hose Start: 07/10/23 1707    Code Status: Limited: Do not attempt resuscitation (DNR) -DNR-LIMITED -Do Not Intubate/DNI  Family Communication: no family at bedside Disposition Plan: SNF placement Reason for continuing need for hospitalization: medically stable  Objective: Vitals:   07/14/23 1703 07/14/23 2055 07/15/23 0216 07/15/23 0849  BP: (!) 171/83 (!) 157/78 (!) 136/91 (!) 174/81  Pulse: 86 (!) 102 (!) 110 81  Resp: 18 16 18 18   Temp: 97.6 F (36.4 C) 98.4 F (36.9 C) 98.2 F (36.8 C) 98.1 F (36.7 C)  TempSrc: Oral Oral Oral Oral  SpO2:  96%  99%   Weight:      Height:        Intake/Output Summary (Last 24 hours) at 07/15/2023 1431 Last data filed at 07/15/2023 0900 Gross per 24 hour  Intake 560 ml  Output 900 ml  Net -340 ml   Filed Weights   07/10/23 1413 07/14/23 0358  Weight: 83.9 kg 87.3 kg     Examination:  Physical Exam Vitals and nursing note reviewed.  Constitutional:      General: He is not in acute distress.    Appearance: He is not toxic-appearing or diaphoretic.     Comments: Appears chronically ill  HENT:     Head: Normocephalic and atraumatic.  Eyes:     General: No scleral icterus. Cardiovascular:     Rate and Rhythm: Normal rate and regular rhythm.  Pulmonary:     Effort: Pulmonary effort is normal.     Breath sounds: Normal breath sounds.  Abdominal:     General: Abdomen is flat. Bowel sounds are normal. There is no distension.  Musculoskeletal:     Right lower leg: No edema.     Left lower leg: No edema.  Skin:    General: Skin is warm and dry.     Capillary Refill: Capillary refill takes less than 2 seconds.  Neurological:     Mental Status: He is alert and oriented to person, place, and time.     Data Reviewed: I have personally reviewed following labs and imaging studies  CBC: Recent Labs  Lab 07/10/23 1417 07/11/23 0557 07/12/23 0430 07/13/23 0350 07/14/23 0414  WBC 9.9 5.6 3.8* 5.2 3.7*  NEUTROABS 8.0*  --   --   --   --   HGB 15.1 12.1* 12.8* 12.7* 12.7*  HCT 44.1 35.1* 36.8* 37.5* 37.6*  MCV 96.3 96.7 96.1 96.6 97.2  PLT 177 116* 112* 146* 148*   Basic Metabolic Panel: Recent Labs  Lab 07/10/23 1417 07/11/23 0557 07/12/23 0430 07/13/23 0350 07/14/23 0414  NA 131* 133* 138 135 134*  K 3.2* 3.2* 3.5 4.5 4.4  CL 88* 92* 95* 96* 97*  CO2 23 29 33* 32 31  GLUCOSE 157* 87 97 95 104*  BUN 34* 26* 22 23 18   CREATININE 1.28* 1.01 0.82 0.86 0.81  CALCIUM 9.2 8.2* 8.7* 8.3* 8.8*  MG 1.7  --  1.5* 2.0 1.8  PHOS  --   --  <1.0* 1.3* 2.6   GFR: Estimated Creatinine Clearance: 95.6 mL/min (by C-G formula based on SCr of 0.81 mg/dL). Liver Function Tests: Recent Labs  Lab 07/10/23 1417  AST 51*  ALT 48*  ALKPHOS 83  BILITOT 3.6*  PROT 6.6  ALBUMIN 3.9   Cardiac Enzymes: Recent Labs  Lab 07/10/23 1548  CKTOTAL 155    BNP (last 3 results) Recent Labs    07/10/23 1417  BNP 541.5*   Anemia Panel: Recent Labs    07/14/23 0414  VITAMINB12 218  FOLATE 9.4    Radiology Studies: DG ESOPHAGUS W SINGLE CM (SOL OR THIN BA) Result Date: 07/13/2023 CLINICAL DATA:  Esophageal dysphagia.  Difficulty swallowing. EXAM: ESOPHAGUS/BARIUM SWALLOW/TABLET STUDY TECHNIQUE: Single contrast examination was performed using thin liquid barium. This exam was performed by Lambert Pillion, PA-C, and was supervised and interpreted by Dr. Bertina Broccoli, MD. FLUOROSCOPY: Radiation Exposure Index (as provided by the fluoroscopic device): 22.00 mGy Kerma Total fluoroscopy time: 114 seconds COMPARISON:  DG esophagus with single contrast media on 12/27/2020; CT chest 02/13/2023, CT chest, abdomen, and  pelvis 11/23/2022 FINDINGS: Please note this study waseverely limited by patient's inability to lie flat without pain, rotate side-to-side, or stand. Patient was placed at 45 degree tilt throughout exam. Swallowing: Appears normal. No vestibular penetration or aspiration seen. Pharynx: Unremarkable. Esophagus: Normal appearance. Esophageal motility: With the patient drinking in upright positioning, there was slow transit of contrast through the gastroesophageal junction, and pooling of contrast throughout the proximal 90% of the esophagus. Moderate to severe dysmotility with notably delayed emptying through gastroesophageal junction. The patient was unable to clear the barium column despite dry swallowing technique and several sips of water. There was a region measuring up to approximately 1.8 vertebral body heights in length (approximately 4.5 cm based on a vertebral body height of 2.5 cm on 02/13/2023 CT) of high-grade approximate 80% narrowing of the distal esophagus and proximal stomach. Mild diffuse circumferential esophageal wall thickening was seen throughout the esophagus on prior 02/13/2023 and 11/23/2022 CTs, greatest at the gastroesophageal  junction. No oral contrast was administered for these prior CTs, however there was mild fluid within the upper esophagus on the 11/23/2022 CT chest abdomen pelvis, and on both CTs the distal esophageal walls are collapsed and apposed, without luminal fluid or air. Findings are suspicious for a stricture at the gastroesophageal junction. Due to the stasis of contrast within the esophagus and patient inability to drink more contrast, the study was aborted. Hiatal Hernia: Not seen. Gastroesophageal reflux: No definite reflux was seen extending retrograde from the stomach to the esophagus, however contrast within the esophagus did travel "to and fro" within the esophagus. Ingested 13mm barium tablet: Not given IMPRESSION: 1. Findings suspicious for a high-grade stricture of the distal esophagus extending through the gastroesophageal junction. No definite wall mass is seen on prior 11/23/2022 or 02/13/2023 CTs. On the CTs the lumen of the distal esophagus is collapsed, and findings are most suspicious for a distal gastroesophageal structure as can be seen secondary to chronic acid reflux. Consider upper endoscopy direct visualization. 2. Due to stasis of contrast within the esophagus, slow clearance into the stomach, patient discomfort, and inability to tolerate further drinks a barium, this study was limited. Electronically Signed   By: Bertina Broccoli M.D.   On: 07/13/2023 16:17    Scheduled Meds:  vitamin C   500 mg Oral BID   cholecalciferol  1,000 Units Oral Daily   vitamin B-12  1,000 mcg Oral Daily   folic acid   1 mg Oral Daily   heparin   5,000 Units Subcutaneous Q8H   lidocaine   1 patch Transdermal Q24H   lipase/protease/amylase  24,000 Units Oral TID AC   losartan   100 mg Oral Daily   methocarbamol  500 mg Oral TID   metoprolol  succinate  25 mg Oral Daily   multivitamin with minerals  1 tablet Oral Daily   pantoprazole   40 mg Oral BID   prochlorperazine  10 mg Oral TID   Ensure Max Protein  11 oz  Oral BID   thiamine   100 mg Oral Daily   Or   thiamine   100 mg Intravenous Daily   zinc sulfate (50mg  elemental zinc)  220 mg Oral Daily   Continuous Infusions:   LOS: 3 days   Time spent: 40 minutes  Unk Garb, DO  [facility]  07/15/2023, 2:31 PM

## 2023-07-15 NOTE — Discharge Summary (Signed)
 [facility]ist Physician Discharge Summary   Patient name: John Booker  Admit date:     07/10/2023  Discharge date: 07/15/2023  Attending Physician: ZHANG, DEKUI [[id number]]  Discharge Physician: Unk Garb   PCP: Monique Ano, MD  Admitted From: Home  Disposition:   [facility]  Recommendations for Outpatient Follow-up:  Follow up with PCP in 1-2 weeks  Home Health:No Equipment/Devices: None    Discharge Condition:Stable CODE STATUS:DNR/DNI Diet recommendation: Dysphagia. Minced meat/finely chopped food Fluid Restriction: None  Hospital Summary: HPI: Mr. John Booker is a 73 year old male with history of hypertension, CKD stage II/IIIa, presents to the emergency department for chief concerns of weakness and dizziness and falling 4 days ago.   Vitals in the ED showed temperature of 98.1, respiration rate 19, heart rate 91, blood pressure 146/97, SpO2 100% on room air.   Serum sodium is 131, potassium 3.2, chloride 88, bicarb 22, BUN of 34, serum creatinine 1.28, EGFR 59, nonfasting blood glucose 159, WBC 9.9, hemoglobin 15.1, platelets 177.   High sensitive troponin was 18 and on repeat was 17.  CK was 155.   UA was negative for leukocytes and nitrates.  Beta hydroxybutyrate was elevated at 396.   BNP was elevated at 541.5.   ED treatment: Sodium chloride  250 mL bolus, and diltiazem  10 mg IV one-time dose. -------------------------------------- At bedside, he is able to tell me his name, age, location, current calendar year.   He fell today and does not remember how he fell.   He reports he may have lost consiciousness after falling. He is not sure.    He states that it was difficult for him to finish dinner because he cannot eat while laying down.   He reports he has baseline swelling of his lower legs for so long he cannot remember when it started.  Per patient daughter, patient has some dementia, also drink large amount alcohol daily.  Has been falling,  but never had a syncope. He has worsening diarrhea for the last month, to the point he was incontinent of stool.  He also has severe hiccups for the past year, which has prevented him from eating. Before admission to the hospital, he was very dizzy. Upon arriving hospital, patient had a significant electrolyte abnormality with AKI.  Received IV fluids in the emergency room.  Significant Events: Admitted 07/10/2023 for AKI   Significant Labs: WBC 9.9, HgB 15.1, plt 177 Na 131, K 3.2, CO2 of 23, BUN 34, Scr 1.28, glu 157 BNP 541 TSH 4.64, FT4 1.15 BHA 3.96 CK 155  Significant Imaging Studies: CXR No active disease  CT head No acute intracranial process  2. Soft tissue swelling overlying the left frontal region. CT c-spine  No acute or healing fractures of the cervical spine. 2. Ankylosis of the cervical and upper thoracic spine. 3. Ossification of posterior longitudinal ligament contributes to central canal stenosis at C2-3 and most significantly at C3-4. 4. Severe central canal stenosis at C3-4 where the canal is narrowed to 5 mm. 5. Uncovertebral spurring contributes to foraminal stenosis on the left at C3-4 and C5-6 and on the right at C4-5. 6. Dilated and fluid-filled upper thoracic esophagus. No discrete lesion is present. Esophogram  Findings suspicious for a high-grade stricture of the distal esophagus extending through the gastroesophageal junction. No definite wall mass is seen on prior 11/23/2022 or 02/13/2023 CTs. On the CTs the lumen of the distal esophagus is collapsed, and findings are most suspicious for a distal gastroesophageal  structure as can be seen secondary to chronic acid reflux. Consider upper  endoscopy direct visualization. 2. Due to stasis of contrast within the esophagus, slow clearance into the stomach, patient discomfort, and inability to tolerate further drinks a barium, this study was limited.  Antibiotic Therapy: Anti-infectives (From admission, onward)    None        Procedures: 07-14-2023 EGD with esophageal dilatation  Consultants: GI   Hospital Course by Problem: * AKI (acute kidney injury) (HCC) 07-11-2023 through 07-14-2023 This appeared to be secondary to decreased p.o. intake and diarrhea.  He received IV fluids, renal function has normalized.  Discontinue IV fluids. Replete phosphate.  Potassium and magnesium  has normalized.  Sodium level normalized. Patient had significant tachycardia and borderline blood pressure, received fluid bolus.  Blood pressure is elevated now.  No need for additional IV fluids. Conditions are improved.  Hyponatremia 07-11-2023 through 07-14-2023 This appeared to be secondary to decreased p.o. intake and diarrhea.  He received IV fluids, renal function has normalized.  Discontinue IV fluids. Replete phosphate.  Potassium and magnesium  has normalized.  Sodium level normalized. Patient had significant tachycardia and borderline blood pressure, received fluid bolus.  Blood pressure is elevated now.  No need for additional IV fluids. Conditions are improved.  Acquired achalasia of esophagus - s/p esophageal dilatation 07-14-2023 07-15-2023 had esophageal dilatation during EGD on 07-14-2023. Pt can swallow normally now. Pt to remains on dysphagia-2 diet(minced meat/finely chopped).   DNR (do not resuscitate)/DNI(Do NOT Intubate) Patient made DNR/DNI on admission.  Hypomagnesemia 07-11-2023 through 07-14-2023 This appeared to be secondary to decreased p.o. intake and diarrhea.  He received IV fluids, renal function has normalized.  Discontinue IV fluids. Replete phosphate.  Potassium and magnesium  has normalized.  Sodium level normalized. Patient had significant tachycardia and borderline blood pressure, received fluid bolus.  Blood pressure is elevated now.  No need for additional IV fluids. Conditions are improved.  Hypophosphatemia 07-11-2023 through 07-14-2023 This appeared to be secondary to decreased p.o.  intake and diarrhea.  He received IV fluids, renal function has normalized.  Discontinue IV fluids. Replete phosphate.  Potassium and magnesium  has normalized.  Sodium level normalized. Patient had significant tachycardia and borderline blood pressure, received fluid bolus.  Blood pressure is elevated now.  No need for additional IV fluids. Conditions are improved.  Hypokalemia 07-11-2023 through 07-14-2023 This appeared to be secondary to decreased p.o. intake and diarrhea.  He received IV fluids, renal function has normalized.  Discontinue IV fluids. Replete phosphate.  Potassium and magnesium  has normalized.  Sodium level normalized. Patient had significant tachycardia and borderline blood pressure, received fluid bolus.  Blood pressure is elevated now.  No need for additional IV fluids. Conditions are improved.  GERD without esophagitis 07-15-2023 continue bid protonix .  Chronic diastolic CHF (congestive heart failure) (HCC) 07-15-2023 stable. On cozaar , toprol -XL.  Generalized weakness 07-11-2023 through 07-14-2023 With increased falls at home.  PT, OT, TOC. Fall precautions  07-15-2023 pt will need SNF placement at discharge.  Alcohol dependence in sustained full remission (HCC) 07-15-2023 pt was on CIWA protocol. Now stopped.   Essential hypertension 07-15-2023  continue metoprolol  succinate 25 mg daily resumed    Discharge Diagnoses:  Principal Problem:   AKI (acute kidney injury) (HCC) Active Problems:   Hyponatremia   Acquired achalasia of esophagus - s/p esophageal dilatation 07-14-2023   Essential hypertension   Alcohol dependence in sustained full remission (HCC)   Generalized weakness   Chronic diastolic CHF (congestive heart failure) (HCC)  GERD without esophagitis   Hypokalemia   Hypophosphatemia   Hypomagnesemia   DNR (do not resuscitate)/DNI(Do NOT Intubate)   Discharge Instructions  Discharge Instructions     DIET DYS 2   Complete by: As directed     Minced meats/finely chopped food   Fluid consistency: Thin   Discharge instructions   Complete by: As directed    1. Follow up with your primary care provider in 1-2 weeks following discharge from hospital.   Increase activity slowly   Complete by: As directed       Allergies as of 07/15/2023       Reactions   Adalat [nifedipine]    Foot swelling.    Amlodipine  Other (See Comments)   Leg swelling        Medication List     STOP taking these medications    chlorpheniramine-HYDROcodone  10-8 MG/5ML Commonly known as: TUSSIONEX   chlorproMAZINE  25 MG tablet Commonly known as: THORAZINE    furosemide  20 MG tablet Commonly known as: LASIX    Nasal Relief 0.05 % nasal spray Generic drug: oxymetazoline    potassium chloride  SA 20 MEQ tablet Commonly known as: KLOR-CON  M       TAKE these medications    ascorbic acid  500 MG tablet Commonly known as: VITAMIN C  Take 1 tablet (500 mg total) by mouth 2 (two) times daily.   cyanocobalamin  1000 MCG tablet Take 1 tablet (1,000 mcg total) by mouth daily. Start taking on: Jul 16, 2023   folic acid  1 MG tablet Commonly known as: FOLVITE  Take 1 tablet (1 mg total) by mouth daily. Start taking on: Jul 16, 2023   HYDROcodone -acetaminophen  5-325 MG tablet Commonly known as: NORCO/VICODIN Take 1 tablet by mouth every 4 (four) hours as needed for moderate pain (pain score 4-6) or severe pain (pain score 7-10).   losartan  100 MG tablet Commonly known as: COZAAR  Take 1 tablet (100 mg total) by mouth daily.   methocarbamol 500 MG tablet Commonly known as: ROBAXIN Take 1 tablet (500 mg total) by mouth every 8 (eight) hours as needed for muscle spasms.   metoprolol  succinate 25 MG 24 hr tablet Commonly known as: TOPROL -XL Take 1 tablet (25 mg total) by mouth daily.   ondansetron  4 MG tablet Commonly known as: ZOFRAN  Take 1 tablet (4 mg total) by mouth every 8 (eight) hours as needed for nausea or vomiting. What changed:  Another medication with the same name was added. Make sure you understand how and when to take each.   ondansetron  4 MG tablet Commonly known as: ZOFRAN  Take 1 tablet (4 mg total) by mouth every 6 (six) hours as needed for nausea, refractory nausea / vomiting or vomiting. What changed: You were already taking a medication with the same name, and this prescription was added. Make sure you understand how and when to take each.   Pancrelipase (Lip-Prot-Amyl) 24000-76000 units Cpep Take 1 capsule (24,000 Units total) by mouth 3 (three) times daily before meals.   pantoprazole  40 MG tablet Commonly known as: PROTONIX  Take 1 tablet (40 mg total) by mouth 2 (two) times daily. What changed: See the new instructions.   prochlorperazine 10 MG tablet Commonly known as: COMPAZINE Take 1 tablet (10 mg total) by mouth every 8 (eight) hours as needed for nausea or vomiting.   thiamine  100 MG tablet Commonly known as: Vitamin B-1 Take 1 tablet (100 mg total) by mouth daily. Start taking on: Jul 16, 2023   vitamin D3 25 MCG tablet  Commonly known as: CHOLECALCIFEROL Take 1 tablet (1,000 Units total) by mouth daily. Start taking on: Jul 16, 2023   zinc sulfate (50mg  elemental zinc) 220 (50 Zn) MG capsule Take 1 capsule (220 mg total) by mouth daily. Start taking on: Jul 16, 2023        Contact information for after-discharge care     Destination     [facility] Preferred SNF .   Service: Skilled Nursing Contact information: [address] [place]  [address] [phone]                    Allergies  Allergen Reactions   Adalat [Nifedipine]     Foot swelling.    Amlodipine  Other (See Comments)    Leg swelling    Discharge Exam: Vitals:   07/15/23 0216 07/15/23 0849  BP: (!) 136/91 (!) 174/81  Pulse: (!) 110 81  Resp: 18 18  Temp: 98.2 F (36.8 C) 98.1 F (36.7 C)  SpO2: 99%     Physical Exam Vitals and nursing note  reviewed.  Constitutional:      General: He is not in acute distress.    Appearance: He is not toxic-appearing or diaphoretic.     Comments: Appears chronically ill  HENT:     Head: Normocephalic and atraumatic.  Eyes:     General: No scleral icterus. Cardiovascular:     Rate and Rhythm: Normal rate and regular rhythm.  Pulmonary:     Effort: Pulmonary effort is normal.     Breath sounds: Normal breath sounds.  Abdominal:     General: Abdomen is flat. Bowel sounds are normal. There is no distension.  Musculoskeletal:     Right lower leg: No edema.     Left lower leg: No edema.  Skin:    General: Skin is warm and dry.     Capillary Refill: Capillary refill takes less than 2 seconds.  Neurological:     Mental Status: He is alert and oriented to person, place, and time.     The results of significant diagnostics from this hospitalization (including imaging, microbiology, ancillary and laboratory) are listed below for reference.    Microbiology: No results found for this or any previous visit (from the past 240 hours).   Labs: BNP (last 3 results) Recent Labs    07/10/23 1417  BNP 541.5*   Basic Metabolic Panel: Recent Labs  Lab 07/10/23 1417 07/11/23 0557 07/12/23 0430 07/13/23 0350 07/14/23 0414  NA 131* 133* 138 135 134*  K 3.2* 3.2* 3.5 4.5 4.4  CL 88* 92* 95* 96* 97*  CO2 23 29 33* 32 31  GLUCOSE 157* 87 97 95 104*  BUN 34* 26* 22 23 18   CREATININE 1.28* 1.01 0.82 0.86 0.81  CALCIUM 9.2 8.2* 8.7* 8.3* 8.8*  MG 1.7  --  1.5* 2.0 1.8  PHOS  --   --  <1.0* 1.3* 2.6   Liver Function Tests: Recent Labs  Lab 07/10/23 1417  AST 51*  ALT 48*  ALKPHOS 83  BILITOT 3.6*  PROT 6.6  ALBUMIN 3.9   CBC: Recent Labs  Lab 07/10/23 1417 07/11/23 0557 07/12/23 0430 07/13/23 0350 07/14/23 0414  WBC 9.9 5.6 3.8* 5.2 3.7*  NEUTROABS 8.0*  --   --   --   --   HGB 15.1 12.1* 12.8* 12.7* 12.7*  HCT 44.1 35.1* 36.8* 37.5* 37.6*  MCV 96.3 96.7 96.1 96.6 97.2   PLT 177 116* 112*  146* 148*   Cardiac Enzymes: Recent Labs  Lab 07/10/23 1548  CKTOTAL 155   BNP: Recent Labs  Lab 07/10/23 1417  BNP 541.5*   Anemia work up Recent Labs    07/14/23 0414  VITAMINB12 218  FOLATE 9.4   Urinalysis    Component Value Date/Time   COLORURINE AMBER (A) 07/10/2023 1417   APPEARANCEUR HAZY (A) 07/10/2023 1417   LABSPEC 1.020 07/10/2023 1417   PHURINE 6.0 07/10/2023 1417   GLUCOSEU 50 (A) 07/10/2023 1417   HGBUR SMALL (A) 07/10/2023 1417   BILIRUBINUR NEGATIVE 07/10/2023 1417   KETONESUR 20 (A) 07/10/2023 1417   PROTEINUR 100 (A) 07/10/2023 1417   NITRITE NEGATIVE 07/10/2023 1417   LEUKOCYTESUR NEGATIVE 07/10/2023 1417   Sepsis Labs Recent Labs  Lab 07/11/23 0557 07/12/23 0430 07/13/23 0350 07/14/23 0414  WBC 5.6 3.8* 5.2 3.7*    Procedures/Studies: DG ESOPHAGUS W SINGLE CM (SOL OR THIN BA) Result Date: 07/13/2023 CLINICAL DATA:  Esophageal dysphagia.  Difficulty swallowing. EXAM: ESOPHAGUS/BARIUM SWALLOW/TABLET STUDY TECHNIQUE: Single contrast examination was performed using thin liquid barium. This exam was performed by Lambert Pillion, PA-C, and was supervised and interpreted by Dr. Bertina Broccoli, MD. FLUOROSCOPY: Radiation Exposure Index (as provided by the fluoroscopic device): 22.00 mGy Kerma Total fluoroscopy time: 114 seconds COMPARISON:  DG esophagus with single contrast media on 12/27/2020; CT chest 02/13/2023, CT chest, abdomen, and pelvis 11/23/2022 FINDINGS: Please note this study waseverely limited by patient's inability to lie flat without pain, rotate side-to-side, or stand. Patient was placed at 45 degree tilt throughout exam. Swallowing: Appears normal. No vestibular penetration or aspiration seen. Pharynx: Unremarkable. Esophagus: Normal appearance. Esophageal motility: With the patient drinking in upright positioning, there was slow transit of contrast through the gastroesophageal junction, and pooling of contrast throughout  the proximal 90% of the esophagus. Moderate to severe dysmotility with notably delayed emptying through gastroesophageal junction. The patient was unable to clear the barium column despite dry swallowing technique and several sips of water. There was a region measuring up to approximately 1.8 vertebral body heights in length (approximately 4.5 cm based on a vertebral body height of 2.5 cm on 02/13/2023 CT) of high-grade approximate 80% narrowing of the distal esophagus and proximal stomach. Mild diffuse circumferential esophageal wall thickening was seen throughout the esophagus on prior 02/13/2023 and 11/23/2022 CTs, greatest at the gastroesophageal junction. No oral contrast was administered for these prior CTs, however there was mild fluid within the upper esophagus on the 11/23/2022 CT chest abdomen pelvis, and on both CTs the distal esophageal walls are collapsed and apposed, without luminal fluid or air. Findings are suspicious for a stricture at the gastroesophageal junction. Due to the stasis of contrast within the esophagus and patient inability to drink more contrast, the study was aborted. Hiatal Hernia: Not seen. Gastroesophageal reflux: No definite reflux was seen extending retrograde from the stomach to the esophagus, however contrast within the esophagus did travel "to and fro" within the esophagus. Ingested 13mm barium tablet: Not given IMPRESSION: 1. Findings suspicious for a high-grade stricture of the distal esophagus extending through the gastroesophageal junction. No definite wall mass is seen on prior 11/23/2022 or 02/13/2023 CTs. On the CTs the lumen of the distal esophagus is collapsed, and findings are most suspicious for a distal gastroesophageal structure as can be seen secondary to chronic acid reflux. Consider upper endoscopy direct visualization. 2. Due to stasis of contrast within the esophagus, slow clearance into the stomach, patient discomfort, and inability to tolerate  further  drinks a barium, this study was limited. Electronically Signed   By: Bertina Broccoli M.D.   On: 07/13/2023 16:17   DG Chest Port 1 View Result Date: 07/11/2023 CLINICAL DATA:  Aspiration into airway. EXAM: PORTABLE CHEST 1 VIEW COMPARISON:  07/10/2023. FINDINGS: The heart is enlarged and mediastinal contours are stable. There is atherosclerotic calcification of the aorta. No consolidation, effusion, or pneumothorax is seen. No acute osseous abnormality. IMPRESSION: No active disease. Electronically Signed   By: Wyvonnia Heimlich M.D.   On: 07/11/2023 13:14   CT Cervical Spine Wo Contrast Result Date: 07/10/2023 CLINICAL DATA:  Fall 4 days ago. Trauma to head. Weakness and dizziness. EXAM: CT CERVICAL SPINE WITHOUT CONTRAST TECHNIQUE: Multidetector CT imaging of the cervical spine was performed without intravenous contrast. Multiplanar CT image reconstructions were also generated. RADIATION DOSE REDUCTION: This exam was performed according to the departmental dose-optimization program which includes automated exposure control, adjustment of the mA and/or kV according to patient size and/or use of iterative reconstruction technique. COMPARISON:  CT of the cervical spine 11/15/2021 FINDINGS: Alignment: No significant listhesis is present. Ankylosis of the cervical and upper thoracic spine is again noted. Straightening of the normal cervical lordosis is present. Skull base and vertebrae: The vertebral body heights are normal. No acute or healing fractures are present. Soft tissues and spinal canal: No prevertebral fluid or swelling. No visible canal hematoma. The upper thoracic esophagus is dilated and fluid-filled. No discrete lesion is present. Disc levels: Ossification of posterior longitudinal ligament contributes to central canal stenosis at C2-3 and most significantly at C3-4. Severe central canal stenosis is present at C3-4 where the canal is narrowed to 5 mm. Uncovertebral spurring contributes to foraminal  stenosis on the left at C3-4 and C5-6 and on the right at C4-5. Upper chest: The lung apices are clear. The thoracic inlet is within normal limits. IMPRESSION: 1. No acute or healing fractures of the cervical spine. 2. Ankylosis of the cervical and upper thoracic spine. 3. Ossification of posterior longitudinal ligament contributes to central canal stenosis at C2-3 and most significantly at C3-4. 4. Severe central canal stenosis at C3-4 where the canal is narrowed to 5 mm. 5. Uncovertebral spurring contributes to foraminal stenosis on the left at C3-4 and C5-6 and on the right at C4-5. 6. Dilated and fluid-filled upper thoracic esophagus. No discrete lesion is present. Electronically Signed   By: Audree Leas M.D.   On: 07/10/2023 16:34   CT HEAD WO CONTRAST ( ) Result Date: 07/10/2023 CLINICAL DATA:  Trauma EXAM: CT HEAD WITHOUT CONTRAST TECHNIQUE: Contiguous axial images were obtained from the base of the skull through the vertex without intravenous contrast. RADIATION DOSE REDUCTION: This exam was performed according to the departmental dose-optimization program which includes automated exposure control, adjustment of the mA and/or kV according to patient size and/or use of iterative reconstruction technique. COMPARISON:  Head CT 11/22/2022. FINDINGS: Brain: No evidence of acute infarction, hemorrhage, hydrocephalus, extra-axial collection or mass lesion/mass effect. There is mild periventricular white matter hypodensity. Vascular: No hyperdense vessel or unexpected calcification. Skull: Normal. Negative for fracture or focal lesion. Sinuses/Orbits: No acute finding. Other: There is soft tissue swelling overlying the left frontal region. IMPRESSION: 1. No acute intracranial process. 2. Soft tissue swelling overlying the left frontal region. Electronically Signed   By: Tyron Gallon M.D.   On: 07/10/2023 16:32   DG Chest Portable 1 View Result Date: 07/10/2023 CLINICAL DATA:  Weakness and dizziness,  status post fall 4  days ago. EXAM: PORTABLE CHEST 1 VIEW COMPARISON:  February 16, 2023 FINDINGS: The heart size and mediastinal contours are within normal limits. Both lungs are clear. Multilevel degenerative changes are seen throughout the thoracic spine. IMPRESSION: No active disease. Electronically Signed   By: Virgle Grime M.D.   On: 07/10/2023 15:04   EGD with Dilatation [facility] Patient Name: Roert Culligan Procedure Date: 07/14/2023 2:46 PM MRN: [id number] Account #: [id number] Date of Birth: [birth year] Admit Type: Inpatient Age: 89 Room: [facility] ENDO ROOM 1 Gender: Male Note Status: Finalized Instrument Name: Upper Endoscope [id number] Procedure:             Upper GI endoscopy Indications:           Abnormal cine-esophagram Providers:             Leida Puna MD, MD Medicines:             Monitored Anesthesia Care Complications:         No immediate complications. Estimated blood loss:                         Minimal. Procedure:             Pre-Anesthesia Assessment:                        - Prior to the procedure, a History and Physical was                         performed, and patient medications and allergies were                         reviewed. The patient is competent. The risks and                         benefits of the procedure and the sedation options and                         risks were discussed with the patient. All questions                         were answered and informed consent was obtained.                         Patient identification and proposed procedure were                         verified by the physician, the nurse, the                         anesthesiologist, the anesthetist and the technician                         in the endoscopy suite. Mental Status Examination:                         alert and oriented. Airway Examination: normal                         oropharyngeal airway and neck mobility.  Respiratory  Examination: clear to auscultation. CV Examination:                         normal. Prophylactic Antibiotics: The patient does not                         require prophylactic antibiotics. Prior                         Anticoagulants: The patient has taken no anticoagulant                         or antiplatelet agents. ASA Grade Assessment: III - A                         patient with severe systemic disease. After reviewing                         the risks and benefits, the patient was deemed in                         satisfactory condition to undergo the procedure. The                         anesthesia plan was to use general anesthesia.                         Immediately prior to administration of medications,                         the patient was re-assessed for adequacy to receive                         sedatives. The heart rate, respiratory rate, oxygen                         saturations, blood pressure, adequacy of pulmonary                         ventilation, and response to care were monitored                         throughout the procedure. The physical status of the                         patient was re-assessed after the procedure.                        After obtaining informed consent, the endoscope was                         passed under direct vision. Throughout the procedure,                         the patient's blood pressure, pulse, and oxygen                         saturations were monitored continuously. The Endoscope  was introduced through the mouth, and advanced to the                         second part of duodenum. The upper GI endoscopy was                         accomplished without difficulty. The patient tolerated                         the procedure well. Findings:      The lumen of the proximal esophagus and mid esophagus was moderately       dilated.      A hypertonic lower  esophageal sphincter was found. A TTS dilator was       passed through the scope. Dilation with a 12-13.5-15 mm balloon dilator       was performed to 15 mm. The dilation site was examined and showed mild       mucosal disruption. Estimated blood loss was minimal.      Abnormal motility was noted in the esophagus. There is a decrease in       motility of the esophageal body. The distal esophagus/lower esophageal       sphincter is spastic, but gives up passage to the endoscope.      The entire examined stomach was normal.      Patchy moderately erythematous mucosa without active bleeding and with       no stigmata of bleeding was found in the duodenal bulb. Impression:            - Dilation in the proximal esophagus and in the mid                         esophagus.                        - Hypertonic lower esophageal sphincter. Dilated.                        - Abnormal esophageal motility.                        - Normal stomach.                        - Erythematous duodenopathy.                        - No specimens collected. Recommendation:        - Return patient to hospital ward for ongoing care.                        - Resume previous diet.                        - Continue present medications.                        - Perform ambulatory esophageal manometry at                         appointment to be scheduled.  Time coordinating discharge: 55 mins  SIGNED:  Unk Garb, DO  [facility] 07/15/23, 3:18 PM

## 2023-07-15 NOTE — Assessment & Plan Note (Signed)
 Suspect secondary to poor p.o. intake Lactated ringer  125 mL/h, 1 day ordered

## 2023-07-15 NOTE — Assessment & Plan Note (Signed)
 07-15-2023 pt was on CIWA protocol. Now stopped.

## 2023-07-16 LAB — HOMOCYSTEINE: Homocysteine: 32.4 umol/L — ABNORMAL HIGH (ref 0.0–19.2)

## 2023-07-19 LAB — VITAMIN E
Vitamin E (Alpha Tocopherol): 10.1 mg/L (ref 9.0–29.0)
Vitamin E(Gamma Tocopherol): 2.1 mg/L (ref 0.5–4.9)

## 2023-07-19 LAB — VITAMIN A: Vitamin A (Retinoic Acid): 33 ug/dL (ref 22.0–69.5)

## 2023-08-15 IMAGING — MR MR CERVICAL SPINE W/O CM
5 series · 39 of 48 positions shown · non-contrast
Comparison: No prior MRI the head or cervical spine. Correlation is
made with CT head 03/03/2017

CLINICAL DATA: CN 9 neuropathy, bilateral arm and leg weakness with
severe neck pain

EXAM:
MRI HEAD WITHOUT CONTRAST
MRI CERVICAL SPINE WITHOUT CONTRAST
TECHNIQUE: Multiplanar, multiecho pulse sequences of the brain and surrounding
structures, and cervical spine, to include the craniocervical
junction and cervicothoracic junction, were obtained without
intravenous contrast.

[Series 9: T2 · sagittal · 3.0mm · 0.62mm/px · 8 of 17 slices shown (1 of 2)]
[im 1/17]
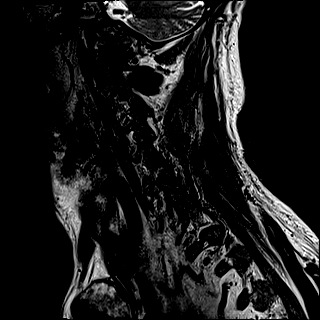
[im 3/17]
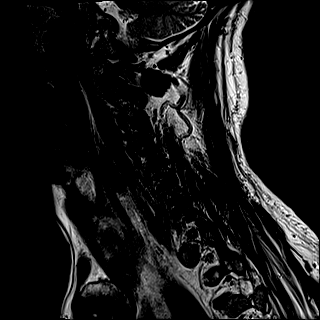
[im 5/17]
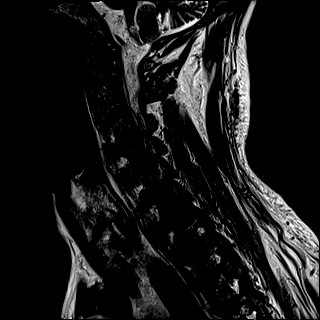
[im 7/17]
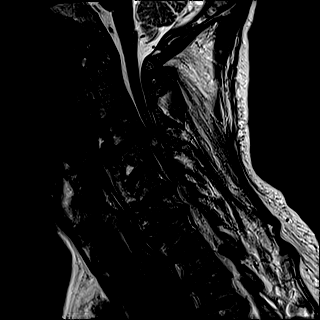
[im 10/17]
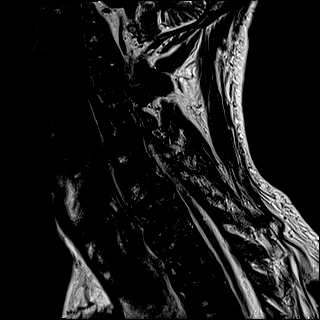
[im 12/17]
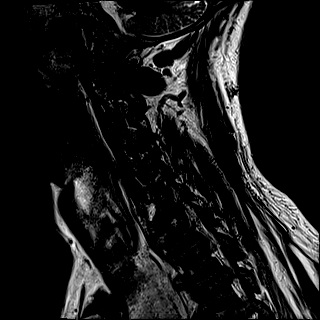
[im 14/17]
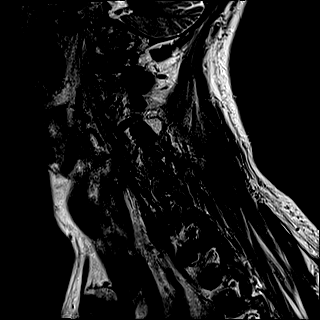
[im 17/17]
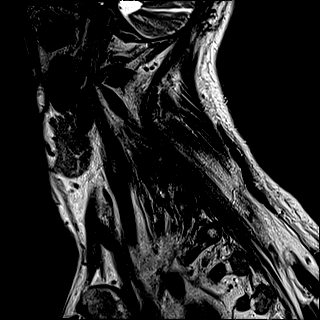

[Series 10: FLAIR · sagittal · 3.0mm · 0.78mm/px · 7 of 17 slices shown]
[im 1/17]
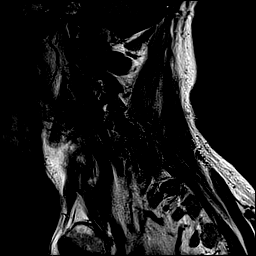
[im 3/17]
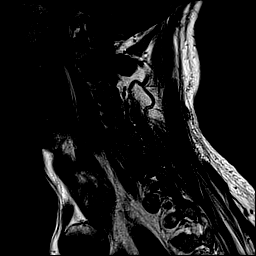
[im 6/17]
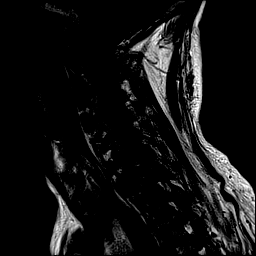
[im 9/17]
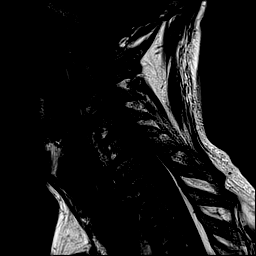
[im 11/17]
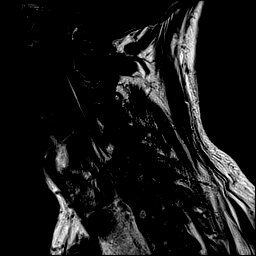
[im 14/17]
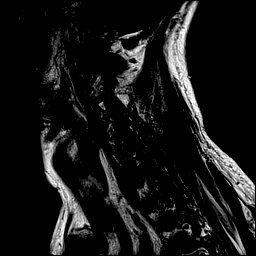
[im 17/17]
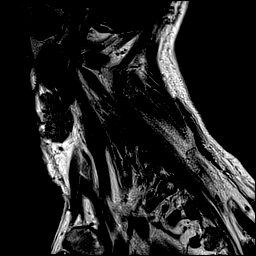

[Series 11: STIR · sagittal · 3.0mm · 0.89mm/px · 7 of 17 slices shown]
[im 1/17]
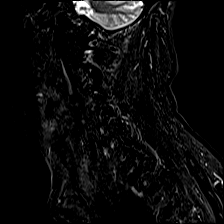
[im 3/17]
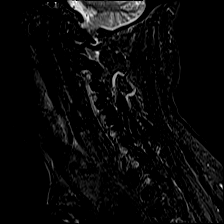
[im 6/17]
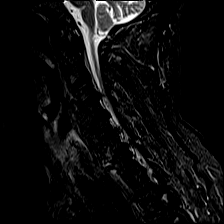
[im 9/17]
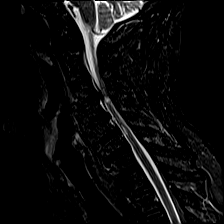
[im 11/17]
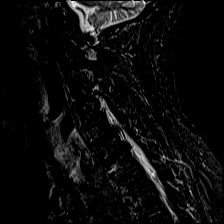
[im 14/17]
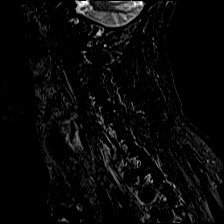
[im 17/17]
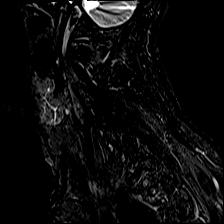

[Series 12: T2 · axial · 3.0mm · 0.70mm/px · z∈[-3,+87]mm · 9 of 32 slices shown (2 of 2)]
[im 1/32]
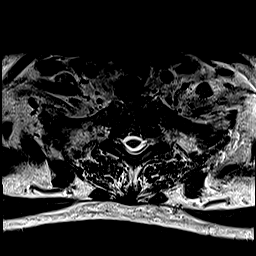
[im 6/32]
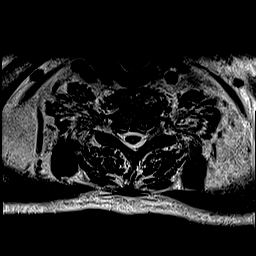
[im 11/32]
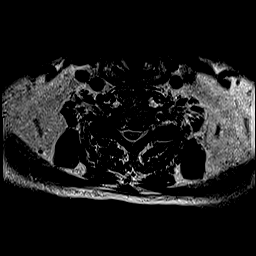
[im 13/32]
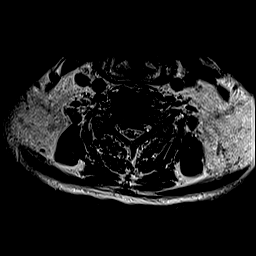
[im 16/32]
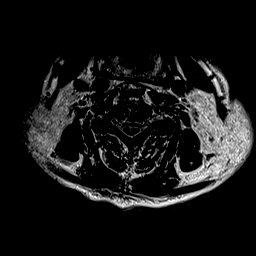
[im 19/32]
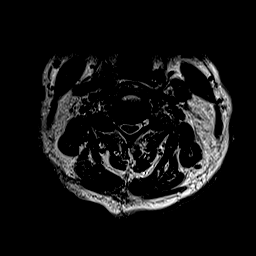
[im 21/32]
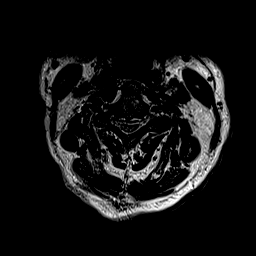
[im 26/32]
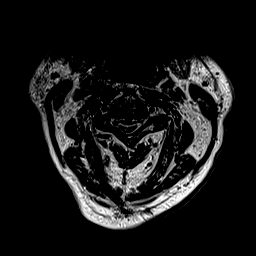
[im 32/32]
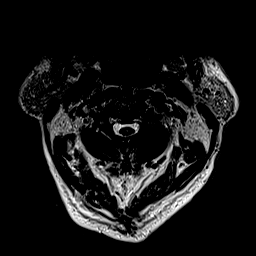

[Series 13: ax mpgr · axial · 3.0mm · 0.35mm/px · z∈[-3,+87]mm · 8 of 32 slices shown]
[im 1/32]
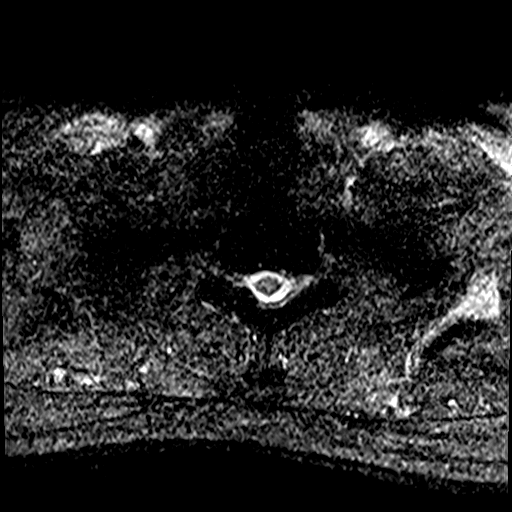
[im 6/32]
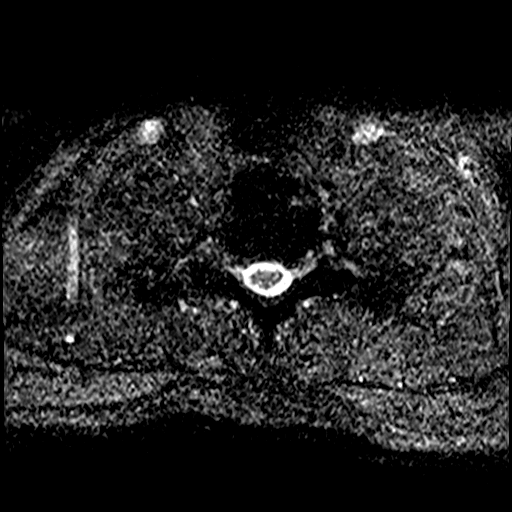
[im 11/32]
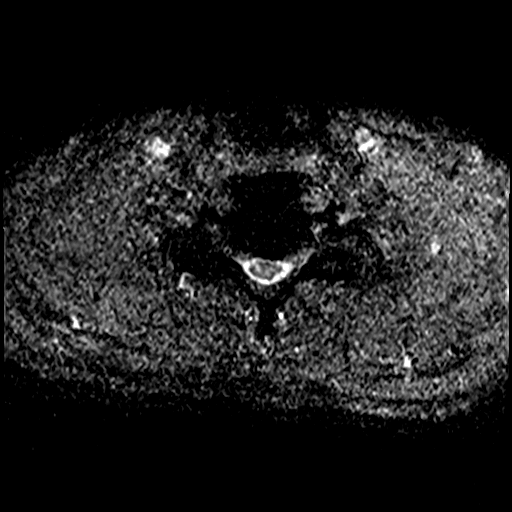
[im 13/32]
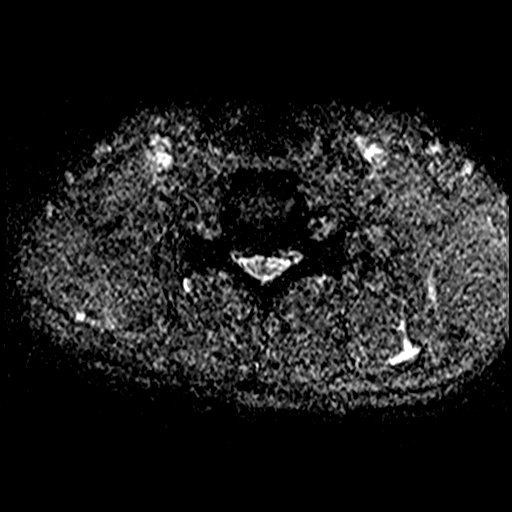
[im 19/32]
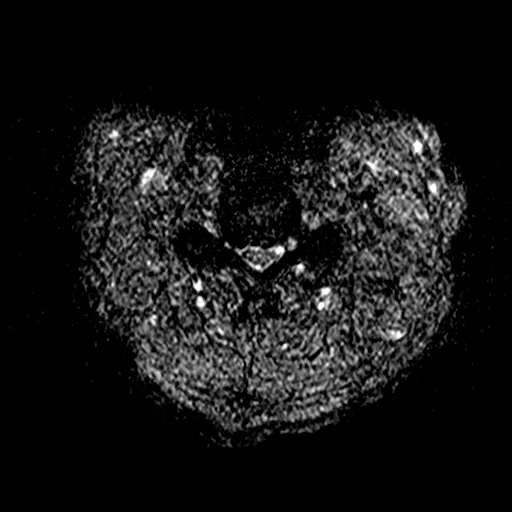
[im 21/32]
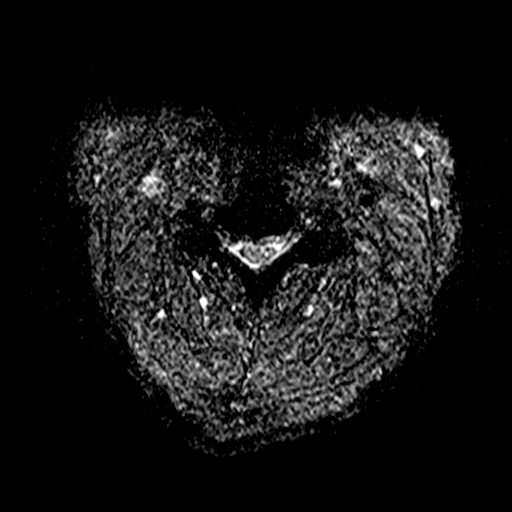
[im 26/32]
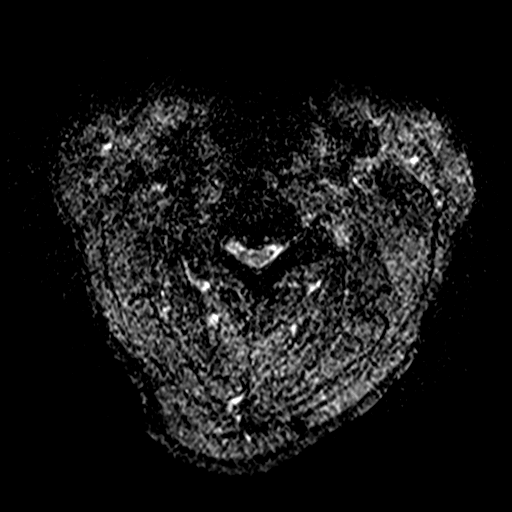
[im 32/32]
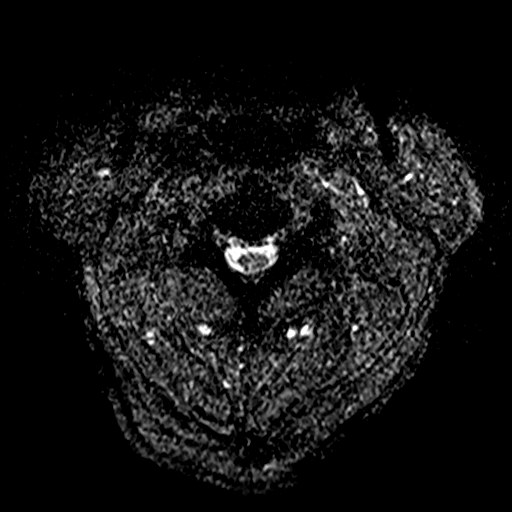

[39 of 48 positions shown; findings below may reference images not displayed]

FINDINGS: MRI HEAD FINDINGS

Brain: No acute infarction, hemorrhage, hydrocephalus, extra-axial
collection, or mass lesion. Mild central cerebral volume loss. No
foci of hemosiderin deposition to suggest remote hemorrhage.

Vascular: Normal flow voids.

Skull and upper cervical spine: Normal marrow signal.

Sinuses/Orbits: Negative.

Other: The mastoids are well aerated.

MRI CERVICAL SPINE FINDINGS

Alignment: Straightening of the normal cervical lordosis. No
significant listhesis.

Vertebrae: Multilevel endplate degenerative changes. No fracture,
evidence discitis, or osseous lesion. Congenitally short pedicles,
which narrow the AP diameter of the spinal canal.

Cord: Increased T2 signal within the entire cross-section spinal
cord at C3-C4 (series 9, image 8 and series 12, image 10-11),
secondary to focal compression. Additional punctate foci of
increased T2 signal in the spinal cord at C5-C6 (series 9, image 9
and series 12, image 20), left greater than right. No evidence of
cord expansion at either level.

Posterior Fossa, vertebral arteries, paraspinal tissues: Negative.

Disc levels:

C2-C3: Central disc protrusion. No spinal canal stenosis or neural
foraminal narrowing.

C3-C4: Large central disc protrusion which indents and deforms the
spinal cord, which demonstrates increased T2 signal. Severe spinal
canal stenosis. Facet arthropathy. Moderate bilateral neural
foraminal narrowing.

C4-C5: Disc bulge with superimposed right foraminal protrusion.
Facet and uncovertebral hypertrophy. Mild spinal canal stenosis.
Severe right neural foraminal narrowing.

C5-C6: Broad-based disc bulge, which abuts the ventral surface of
the cord. Uncovertebral and facet arthropathy. Moderate spinal canal
stenosis. Moderate to severe bilateral neural foraminal narrowing

C6-C7: Broad-based disc bulge. Facet and uncovertebral hypertrophy.
No spinal canal stenosis. Severe right and mild left neural
foraminal narrowing.

C7-T1: No significant disc bulge. No neural foraminal narrowing or
spinal canal stenosis.
IMPRESSION: 1. Holocord increased T2 signal at C3-C4, likely compressive
myelopathy secondary to a large central disc protrusion, which
deforms the spinal cord and causes severe spinal canal stenosis. In
addition there is moderate bilateral neural foraminal narrowing at
this level.
2. Additional more focal increased T2 signal within the
left-greater-than-right spinal cord at C5-C6, which may also be
myelopathy secondary to compression. There is moderate spinal canal
stenosis and moderate to severe bilateral neural foraminal narrowing
at this level.
3. C4-C5 mild spinal canal stenosis and severe right neural
foraminal narrowing.
4. C6-C7 severe right and mild left neural foraminal narrowing.
5. No acute intracranial process.

## 2024-01-07 ENCOUNTER — Inpatient Hospital Stay

## 2024-01-07 ENCOUNTER — Emergency Department

## 2024-01-07 ENCOUNTER — Other Ambulatory Visit: Payer: Self-pay

## 2024-01-07 ENCOUNTER — Inpatient Hospital Stay
Admission: EM | Admit: 2024-01-07 | Discharge: 2024-01-21 | DRG: 271 | Disposition: A | Discharge status: Skilled Nursing Facility

## 2024-01-07 ENCOUNTER — Encounter: Payer: Self-pay | Admitting: Emergency Medicine

## 2024-01-07 DIAGNOSIS — N179 Acute kidney failure, unspecified: Secondary | ICD-10-CM | POA: Diagnosis present

## 2024-01-07 DIAGNOSIS — I739 Peripheral vascular disease, unspecified: Secondary | ICD-10-CM | POA: Insufficient documentation

## 2024-01-07 DIAGNOSIS — Z8249 Family history of ischemic heart disease and other diseases of the circulatory system: Secondary | ICD-10-CM | POA: Diagnosis not present

## 2024-01-07 DIAGNOSIS — L97529 Non-pressure chronic ulcer of other part of left foot with unspecified severity: Secondary | ICD-10-CM | POA: Diagnosis present

## 2024-01-07 DIAGNOSIS — E876 Hypokalemia: Secondary | ICD-10-CM | POA: Diagnosis present

## 2024-01-07 DIAGNOSIS — I70261 Atherosclerosis of native arteries of extremities with gangrene, right leg: Principal | ICD-10-CM | POA: Diagnosis present

## 2024-01-07 DIAGNOSIS — I5032 Chronic diastolic (congestive) heart failure: Secondary | ICD-10-CM | POA: Diagnosis present

## 2024-01-07 DIAGNOSIS — I951 Orthostatic hypotension: Secondary | ICD-10-CM

## 2024-01-07 DIAGNOSIS — R600 Localized edema: Secondary | ICD-10-CM | POA: Diagnosis not present

## 2024-01-07 DIAGNOSIS — F1011 Alcohol abuse, in remission: Secondary | ICD-10-CM | POA: Diagnosis present

## 2024-01-07 DIAGNOSIS — Z87442 Personal history of urinary calculi: Secondary | ICD-10-CM

## 2024-01-07 DIAGNOSIS — E8809 Other disorders of plasma-protein metabolism, not elsewhere classified: Secondary | ICD-10-CM | POA: Diagnosis present

## 2024-01-07 DIAGNOSIS — T3695XA Adverse effect of unspecified systemic antibiotic, initial encounter: Secondary | ICD-10-CM | POA: Diagnosis not present

## 2024-01-07 DIAGNOSIS — I96 Gangrene, not elsewhere classified: Secondary | ICD-10-CM | POA: Diagnosis not present

## 2024-01-07 DIAGNOSIS — L97919 Non-pressure chronic ulcer of unspecified part of right lower leg with unspecified severity: Secondary | ICD-10-CM | POA: Diagnosis not present

## 2024-01-07 DIAGNOSIS — L03115 Cellulitis of right lower limb: Secondary | ICD-10-CM | POA: Diagnosis present

## 2024-01-07 DIAGNOSIS — I70202 Unspecified atherosclerosis of native arteries of extremities, left leg: Secondary | ICD-10-CM | POA: Diagnosis not present

## 2024-01-07 DIAGNOSIS — R1314 Dysphagia, pharyngoesophageal phase: Secondary | ICD-10-CM | POA: Diagnosis not present

## 2024-01-07 DIAGNOSIS — R601 Generalized edema: Secondary | ICD-10-CM | POA: Diagnosis present

## 2024-01-07 DIAGNOSIS — I743 Embolism and thrombosis of arteries of the lower extremities: Secondary | ICD-10-CM | POA: Diagnosis not present

## 2024-01-07 DIAGNOSIS — Z9841 Cataract extraction status, right eye: Secondary | ICD-10-CM

## 2024-01-07 DIAGNOSIS — K220 Achalasia of cardia: Secondary | ICD-10-CM | POA: Diagnosis present

## 2024-01-07 DIAGNOSIS — Z9889 Other specified postprocedural states: Secondary | ICD-10-CM | POA: Diagnosis not present

## 2024-01-07 DIAGNOSIS — I708 Atherosclerosis of other arteries: Secondary | ICD-10-CM | POA: Diagnosis present

## 2024-01-07 DIAGNOSIS — L97519 Non-pressure chronic ulcer of other part of right foot with unspecified severity: Secondary | ICD-10-CM | POA: Diagnosis present

## 2024-01-07 DIAGNOSIS — Z95828 Presence of other vascular implants and grafts: Secondary | ICD-10-CM | POA: Diagnosis not present

## 2024-01-07 DIAGNOSIS — K219 Gastro-esophageal reflux disease without esophagitis: Secondary | ICD-10-CM | POA: Diagnosis present

## 2024-01-07 DIAGNOSIS — R1319 Other dysphagia: Secondary | ICD-10-CM

## 2024-01-07 DIAGNOSIS — E440 Moderate protein-calorie malnutrition: Secondary | ICD-10-CM | POA: Diagnosis present

## 2024-01-07 DIAGNOSIS — Z8619 Personal history of other infectious and parasitic diseases: Secondary | ICD-10-CM

## 2024-01-07 DIAGNOSIS — Z6820 Body mass index (BMI) 20.0-20.9, adult: Secondary | ICD-10-CM

## 2024-01-07 DIAGNOSIS — D631 Anemia in chronic kidney disease: Secondary | ICD-10-CM | POA: Diagnosis present

## 2024-01-07 DIAGNOSIS — Z7189 Other specified counseling: Secondary | ICD-10-CM | POA: Diagnosis not present

## 2024-01-07 DIAGNOSIS — R011 Cardiac murmur, unspecified: Secondary | ICD-10-CM | POA: Diagnosis present

## 2024-01-07 DIAGNOSIS — K222 Esophageal obstruction: Secondary | ICD-10-CM | POA: Diagnosis not present

## 2024-01-07 DIAGNOSIS — R197 Diarrhea, unspecified: Secondary | ICD-10-CM

## 2024-01-07 DIAGNOSIS — Z66 Do not resuscitate: Secondary | ICD-10-CM | POA: Diagnosis not present

## 2024-01-07 DIAGNOSIS — E871 Hypo-osmolality and hyponatremia: Secondary | ICD-10-CM | POA: Diagnosis present

## 2024-01-07 DIAGNOSIS — Z888 Allergy status to other drugs, medicaments and biological substances status: Secondary | ICD-10-CM

## 2024-01-07 DIAGNOSIS — Z635 Disruption of family by separation and divorce: Secondary | ICD-10-CM

## 2024-01-07 DIAGNOSIS — Z79899 Other long term (current) drug therapy: Secondary | ICD-10-CM

## 2024-01-07 DIAGNOSIS — I700 Atherosclerosis of aorta: Secondary | ICD-10-CM | POA: Diagnosis present

## 2024-01-07 DIAGNOSIS — N400 Enlarged prostate without lower urinary tract symptoms: Secondary | ICD-10-CM | POA: Diagnosis present

## 2024-01-07 DIAGNOSIS — N182 Chronic kidney disease, stage 2 (mild): Secondary | ICD-10-CM | POA: Diagnosis present

## 2024-01-07 DIAGNOSIS — Z9842 Cataract extraction status, left eye: Secondary | ICD-10-CM

## 2024-01-07 DIAGNOSIS — L03116 Cellulitis of left lower limb: Secondary | ICD-10-CM | POA: Diagnosis present

## 2024-01-07 DIAGNOSIS — Z87891 Personal history of nicotine dependence: Secondary | ICD-10-CM

## 2024-01-07 DIAGNOSIS — Z602 Problems related to living alone: Secondary | ICD-10-CM | POA: Diagnosis present

## 2024-01-07 DIAGNOSIS — I130 Hypertensive heart and chronic kidney disease with heart failure and stage 1 through stage 4 chronic kidney disease, or unspecified chronic kidney disease: Secondary | ICD-10-CM | POA: Diagnosis present

## 2024-01-07 DIAGNOSIS — Z515 Encounter for palliative care: Secondary | ICD-10-CM | POA: Diagnosis not present

## 2024-01-07 DIAGNOSIS — L97503 Non-pressure chronic ulcer of other part of unspecified foot with necrosis of muscle: Secondary | ICD-10-CM | POA: Diagnosis not present

## 2024-01-07 DIAGNOSIS — R1310 Dysphagia, unspecified: Secondary | ICD-10-CM | POA: Diagnosis not present

## 2024-01-07 DIAGNOSIS — I5031 Acute diastolic (congestive) heart failure: Secondary | ICD-10-CM | POA: Diagnosis not present

## 2024-01-07 LAB — CBC WITH DIFFERENTIAL/PLATELET
Abs Immature Granulocytes: 0.02 K/uL (ref 0.00–0.07)
Basophils Absolute: 0 K/uL (ref 0.0–0.1)
Basophils Relative: 0 %
Eosinophils Absolute: 0 K/uL (ref 0.0–0.5)
Eosinophils Relative: 0 %
HCT: 38.4 % — ABNORMAL LOW (ref 39.0–52.0)
Hemoglobin: 12.3 g/dL — ABNORMAL LOW (ref 13.0–17.0)
Immature Granulocytes: 0 %
Lymphocytes Relative: 5 %
Lymphs Abs: 0.5 K/uL — ABNORMAL LOW (ref 0.7–4.0)
MCH: 32.2 pg (ref 26.0–34.0)
MCHC: 32 g/dL (ref 30.0–36.0)
MCV: 100.5 fL — ABNORMAL HIGH (ref 80.0–100.0)
Monocytes Absolute: 0.4 K/uL (ref 0.1–1.0)
Monocytes Relative: 5 %
Neutro Abs: 7.9 K/uL — ABNORMAL HIGH (ref 1.7–7.7)
Neutrophils Relative %: 90 %
Platelets: 308 K/uL (ref 150–400)
RBC: 3.82 MIL/uL — ABNORMAL LOW (ref 4.22–5.81)
RDW: 12.7 % (ref 11.5–15.5)
WBC: 8.8 K/uL (ref 4.0–10.5)
nRBC: 0 % (ref 0.0–0.2)

## 2024-01-07 LAB — IRON AND TIBC
Iron: 13 ug/dL — ABNORMAL LOW (ref 45–182)
Saturation Ratios: 9 % — ABNORMAL LOW (ref 17.9–39.5)
TIBC: 143 ug/dL — ABNORMAL LOW (ref 250–450)
UIBC: 130 ug/dL

## 2024-01-07 LAB — BRAIN NATRIURETIC PEPTIDE: B Natriuretic Peptide: 106.9 pg/mL — ABNORMAL HIGH (ref 0.0–100.0)

## 2024-01-07 LAB — COMPREHENSIVE METABOLIC PANEL WITH GFR
ALT: 16 U/L (ref 0–44)
AST: 22 U/L (ref 15–41)
Albumin: 2.5 g/dL — ABNORMAL LOW (ref 3.5–5.0)
Alkaline Phosphatase: 79 U/L (ref 38–126)
Anion gap: 11 (ref 5–15)
BUN: 19 mg/dL (ref 8–23)
CO2: 20 mmol/L — ABNORMAL LOW (ref 22–32)
Calcium: 8.4 mg/dL — ABNORMAL LOW (ref 8.9–10.3)
Chloride: 103 mmol/L (ref 98–111)
Creatinine, Ser: 1.29 mg/dL — ABNORMAL HIGH (ref 0.61–1.24)
GFR, Estimated: 58 mL/min — ABNORMAL LOW (ref >60)
Glucose, Bld: 93 mg/dL (ref 70–99)
Potassium: 4.5 mmol/L (ref 3.5–5.1)
Sodium: 134 mmol/L — ABNORMAL LOW (ref 135–145)
Total Bilirubin: 1 mg/dL (ref 0.0–1.2)
Total Protein: 5.7 g/dL — ABNORMAL LOW (ref 6.5–8.1)

## 2024-01-07 LAB — URINE DRUG SCREEN, QUALITATIVE (ARMC ONLY)
Amphetamines, Ur Screen: NOT DETECTED
Barbiturates, Ur Screen: NOT DETECTED
Benzodiazepine, Ur Scrn: NOT DETECTED
Cannabinoid 50 Ng, Ur ~~LOC~~: NOT DETECTED
Cocaine Metabolite,Ur ~~LOC~~: NOT DETECTED
MDMA (Ecstasy)Ur Screen: NOT DETECTED
Methadone Scn, Ur: NOT DETECTED
Opiate, Ur Screen: NOT DETECTED
Phencyclidine (PCP) Ur S: NOT DETECTED
Tricyclic, Ur Screen: NOT DETECTED

## 2024-01-07 LAB — ETHANOL: Alcohol, Ethyl (B): <15 mg/dL (ref <15)

## 2024-01-07 LAB — MAGNESIUM: Magnesium: 1.6 mg/dL — ABNORMAL LOW (ref 1.7–2.4)

## 2024-01-07 LAB — VITAMIN D 25 HYDROXY (VIT D DEFICIENCY, FRACTURES): Vit D, 25-Hydroxy: 13.69 ng/mL — ABNORMAL LOW (ref 30–100)

## 2024-01-07 LAB — TROPONIN I (HIGH SENSITIVITY): Troponin I (High Sensitivity): 8 ng/L (ref <18)

## 2024-01-07 LAB — PHOSPHORUS: Phosphorus: 2.5 mg/dL (ref 2.5–4.6)

## 2024-01-07 LAB — VITAMIN B12: Vitamin B-12: 567 pg/mL (ref 180–914)

## 2024-01-07 LAB — MRSA NEXT GEN BY PCR, NASAL: MRSA by PCR Next Gen: NOT DETECTED

## 2024-01-07 LAB — FOLATE: Folate: 8.8 ng/mL (ref >5.9)

## 2024-01-07 MED ORDER — FUROSEMIDE 10 MG/ML IJ SOLN
60.0000 mg | Freq: Once | INTRAMUSCULAR | Status: AC
  Administered 2024-01-07: 60 mg via INTRAVENOUS
  Filled 2024-01-07: qty 8

## 2024-01-07 MED ORDER — ACETAMINOPHEN 325 MG PO TABS
650.0000 mg | ORAL_TABLET | Freq: Four times a day (QID) | ORAL | Status: DC | PRN
  Filled 2024-01-07: qty 2

## 2024-01-07 MED ORDER — VANCOMYCIN HCL 1500 MG/300ML IV SOLN
1500.0000 mg | INTRAVENOUS | Status: DC
  Administered 2024-01-08: 1500 mg via INTRAVENOUS
  Filled 2024-01-07 (×2): qty 300

## 2024-01-07 MED ORDER — SODIUM CHLORIDE 0.9% FLUSH: 3.0000 mL | INTRAVENOUS | Status: DC | PRN

## 2024-01-07 MED ORDER — LORAZEPAM 1 MG PO TABS: 1.0000 mg | ORAL_TABLET | ORAL | Status: AC | PRN

## 2024-01-07 MED ORDER — VANCOMYCIN HCL IN DEXTROSE 1-5 GM/200ML-% IV SOLN
1000.0000 mg | Freq: Once | INTRAVENOUS | Status: AC
  Administered 2024-01-07: 1000 mg via INTRAVENOUS
  Filled 2024-01-07: qty 200

## 2024-01-07 MED ORDER — LORAZEPAM 2 MG/ML IJ SOLN: 1.0000 mg | INTRAMUSCULAR | Status: AC | PRN

## 2024-01-07 MED ORDER — FUROSEMIDE 10 MG/ML IJ SOLN
8.0000 mg/h | INTRAVENOUS | Status: DC
  Administered 2024-01-07 – 2024-01-10 (×4): 8 mg/h via INTRAVENOUS
  Filled 2024-01-07 (×3): qty 20

## 2024-01-07 MED ORDER — BISACODYL 5 MG PO TBEC: 10.0000 mg | DELAYED_RELEASE_TABLET | Freq: Every day | ORAL | Status: DC | PRN

## 2024-01-07 MED ORDER — METOPROLOL SUCCINATE ER 25 MG PO TB24
25.0000 mg | ORAL_TABLET | Freq: Every day | ORAL | Status: DC
  Administered 2024-01-07 – 2024-01-18 (×9): 25 mg via ORAL
  Filled 2024-01-07 (×10): qty 1

## 2024-01-07 MED ORDER — SODIUM CHLORIDE 0.9% FLUSH: 10.0000 mL | INTRAVENOUS | Status: DC | PRN

## 2024-01-07 MED ORDER — LOSARTAN POTASSIUM 50 MG PO TABS
100.0000 mg | ORAL_TABLET | Freq: Every day | ORAL | Status: DC
  Administered 2024-01-07: 100 mg via ORAL
  Filled 2024-01-07: qty 2

## 2024-01-07 MED ORDER — THIAMINE MONONITRATE 100 MG PO TABS
100.0000 mg | ORAL_TABLET | Freq: Every day | ORAL | Status: DC
  Administered 2024-01-07 – 2024-01-20 (×7): 100 mg via ORAL
  Filled 2024-01-07 (×10): qty 1

## 2024-01-07 MED ORDER — SODIUM CHLORIDE 0.9 % IV SOLN
1.0000 g | Freq: Every day | INTRAVENOUS | Status: DC
  Administered 2024-01-07 – 2024-01-13 (×7): 1 g via INTRAVENOUS
  Filled 2024-01-07 (×7): qty 10

## 2024-01-07 MED ORDER — SODIUM CHLORIDE 0.9% FLUSH
10.0000 mL | Freq: Two times a day (BID) | INTRAVENOUS | Status: DC
  Administered 2024-01-07 – 2024-01-11 (×5): 10 mL
  Administered 2024-01-11: 40 mL
  Administered 2024-01-12 – 2024-01-21 (×16): 10 mL

## 2024-01-07 MED ORDER — METHOCARBAMOL 500 MG PO TABS
500.0000 mg | ORAL_TABLET | Freq: Three times a day (TID) | ORAL | Status: DC | PRN
  Administered 2024-01-07: 500 mg via ORAL
  Filled 2024-01-07: qty 1

## 2024-01-07 MED ORDER — SODIUM CHLORIDE 0.9% FLUSH
3.0000 mL | Freq: Two times a day (BID) | INTRAVENOUS | Status: DC
  Administered 2024-01-07 – 2024-01-08 (×3): 3 mL via INTRAVENOUS

## 2024-01-07 MED ORDER — THIAMINE HCL 100 MG/ML IJ SOLN
100.0000 mg | Freq: Every day | INTRAMUSCULAR | Status: DC
  Administered 2024-01-08 – 2024-01-14 (×4): 100 mg via INTRAVENOUS
  Filled 2024-01-07 (×4): qty 2

## 2024-01-07 MED ORDER — FOLIC ACID 1 MG PO TABS
1.0000 mg | ORAL_TABLET | Freq: Every day | ORAL | Status: DC
  Administered 2024-01-07 – 2024-01-20 (×11): 1 mg via ORAL
  Filled 2024-01-07 (×13): qty 1

## 2024-01-07 MED ORDER — ONDANSETRON HCL 4 MG PO TABS: 4.0000 mg | ORAL_TABLET | Freq: Four times a day (QID) | ORAL | Status: DC | PRN

## 2024-01-07 MED ORDER — ONDANSETRON HCL 4 MG/2ML IJ SOLN
4.0000 mg | Freq: Four times a day (QID) | INTRAMUSCULAR | Status: DC | PRN
  Administered 2024-01-14: 4 mg via INTRAVENOUS

## 2024-01-07 MED ORDER — PANTOPRAZOLE SODIUM 40 MG PO TBEC
40.0000 mg | DELAYED_RELEASE_TABLET | Freq: Two times a day (BID) | ORAL | Status: DC
  Administered 2024-01-07 – 2024-01-21 (×23): 40 mg via ORAL
  Filled 2024-01-07 (×24): qty 1

## 2024-01-07 MED ORDER — ADULT MULTIVITAMIN W/MINERALS CH
1.0000 | ORAL_TABLET | Freq: Every day | ORAL | Status: DC
  Administered 2024-01-07 – 2024-01-21 (×11): 1 via ORAL
  Filled 2024-01-07 (×13): qty 1

## 2024-01-07 MED ORDER — MAGNESIUM SULFATE 2 GM/50ML IV SOLN
2.0000 g | Freq: Once | INTRAVENOUS | Status: AC
  Administered 2024-01-07: 2 g via INTRAVENOUS
  Filled 2024-01-07: qty 50

## 2024-01-07 MED ORDER — ACETAMINOPHEN 650 MG RE SUPP: 650.0000 mg | Freq: Four times a day (QID) | RECTAL | Status: DC | PRN

## 2024-01-07 MED ORDER — POLYETHYLENE GLYCOL 3350 17 G PO PACK: 17.0000 g | PACK | Freq: Every day | ORAL | Status: DC | PRN

## 2024-01-07 MED ORDER — HYDROCODONE-ACETAMINOPHEN 5-325 MG PO TABS
1.0000 | ORAL_TABLET | ORAL | Status: DC | PRN
Start: 2024-01-07 — End: 2024-01-21
  Administered 2024-01-07 – 2024-01-14 (×8): 1 via ORAL
  Filled 2024-01-07 (×8): qty 1

## 2024-01-07 MED ORDER — SODIUM CHLORIDE 0.9 % IV SOLN
12.5000 mg | Freq: Once | INTRAVENOUS | Status: AC
  Administered 2024-01-08: 12.5 mg via INTRAVENOUS
  Filled 2024-01-07: qty 0.5

## 2024-01-07 MED ORDER — SODIUM CHLORIDE 0.9 % IV SOLN: 250.0000 mL | INTRAVENOUS | Status: DC | PRN

## 2024-01-07 MED ORDER — VITAMIN C 500 MG PO TABS
500.0000 mg | ORAL_TABLET | Freq: Every day | ORAL | Status: DC
  Administered 2024-01-07 – 2024-01-14 (×6): 500 mg via ORAL
  Filled 2024-01-07 (×7): qty 1

## 2024-01-07 MED ORDER — ENOXAPARIN SODIUM 40 MG/0.4ML IJ SOSY
40.0000 mg | PREFILLED_SYRINGE | INTRAMUSCULAR | Status: DC
  Administered 2024-01-07 – 2024-01-08 (×2): 40 mg via SUBCUTANEOUS
  Filled 2024-01-07 (×2): qty 0.4

## 2024-01-07 MED ORDER — POLYSACCHARIDE IRON COMPLEX 150 MG PO CAPS
150.0000 mg | ORAL_CAPSULE | Freq: Every day | ORAL | Status: DC
  Administered 2024-01-07 – 2024-01-21 (×12): 150 mg via ORAL
  Filled 2024-01-07 (×16): qty 1

## 2024-01-07 NOTE — Progress Notes (Signed)
 Pt with multiple IV medications ordered and scheduled at the same time.  Pt only has 1 IV which is midline obtained by IV teamRONITA Sor, MD, made aware that Lasix  drip will be started once all IV medications given.  MD acknowledged.

## 2024-01-07 NOTE — ED Triage Notes (Signed)
 Patient to ED via [facility] from home after a fall. PT reports falling while trying to get shoes on. Denies injury from fall. Edema noted to upper and lower extremities. Lower extremities weeping with wound to left outer leg. Right 3rd toe noted to be black in color. Denies SOB.

## 2024-01-07 NOTE — ED Triage Notes (Addendum)
 Arrived by [facility] from home for mechanical fall. EMS reports patient had been on floor for about 30 minutes. Denies LOC.  Weeping edema to bilateral feet. Wound noted to left, calf area. Third toe on right foot is black in color.   Per patient takes fluid pill.   EMS vitals: 183/78 b/p 96% RA 104HR 97.7oral 72CBG

## 2024-01-07 NOTE — H&P (Signed)
 [facility] History and Physical   Patient: John Booker FMW:[id number]   PCP: Alla Amis, MD DOB: 25-Jan-1951   DOA: 01/07/2024   DOS: 01/07/2024   DOS: the patient was seen and examined on 01/07/2024  Patient coming from: The patient is coming from Home  Chief Complaint: Edema of bilateral upper and lower extremities  HPI: John Booker is a 73 y.o. male with Past medical history of HTN, HFpEF, CKD stage II, lymphedema of bilateral lower extremities, esophageal stricture s/p dilatation, chronic hiccups as reviewed from EMR, presented at [facility] ED with complaining of bilateral lower extremity and upper extremity edema which is gradually getting worse.  Patient is not a very good historian, as per patient's daughter lower extremity edema is gradually getting worse for the past 1 week and it is weeping.  There is an area of erythema in the left lower extremity on the lateral side which he scratched maybe had trauma.  Patient reported no fever.  While patient was getting ready to come to the ED, he fell and landed on his buttocks, no LOC.  Denies any pain after fall.   ED Course: VS temp 98.2, HR 107, RR 17, BP 129/62, 100% on RA BMP: Sodium 134, CO2 20, creatinine 1.29, calcium 8.4, magnesium  1.6, albumin 2.5, total protein 5.7, rest within normal range. BNP 106 Troponin x 1 negative Iron profile: Iron 13, TSAT 9%, iron deficiency. CBC WBC 8.8 WNL, hemoglobin 12.3 mild anemia, MCV 100.5 elevated  CXR: No acute abnormality. No radiographic evidence of fluid overload.    Review of Systems: as mentioned in the history of present illness.  All other systems reviewed and are negative.  Past Medical History:  Diagnosis Date   (HFpEF) heart failure with preserved ejection fraction (HCC)    a.) TTE 04/11/2021: EF 60-65%, no RWMAs, G1DD, mild LAE, RVSF norm, asc Ao 38 mm; b.) TTE 11/18/2021: EF 60-65%, no RWMAs, mild LVH, G1DD, norm RVSH; c.) TEE 11/20/2021: EF 60-65%, no RWMAs, no  LAA thrombus, triv MR, no valvular vegetation, no IAS; d.) TTE 11/24/2022: EF 60-65%, no RWMAs, mod LVH, norm RVSF   Alcohol abuse, in remission    Aortic atherosclerosis    Bacteremia due to Gram-negative bacteria (Klebsiella oxytoca)    a.) source unidentified; b.) admitted to [facility] 11/20/2021 - 12/28/2021   Bilateral lower extremity edema    BPH (benign prostatic hyperplasia)    Cholelithiasis    Chronic hiccups    CKD (chronic kidney disease), stage III (HCC)    DOE (dyspnea on exertion)    Esophageal dysmotility    GERD (gastroesophageal reflux disease)    Hemorrhoids    Hepatic cyst    History of bilateral cataract extraction 2024   History of hiatal hernia    History of MSSA bacteremia secondary to foot wounds, s/p treatment 9/13 - 12/28/21 02/18/2022   Hypertension    Mediastinal mass    Memory changes    Metabolic encephalopathy    a.) in the setting of bacteremia/sepsis   MSSA bacteremia 11/2021   a.) source was foot wounds and probable septic arthritis in the lumbar spine   Nephrolithiasis    Neuropathy    Non-traumatic rhabdomyolysis    Paraspinal mass    Renal cyst, right    Sigmoid diverticulosis    Past Surgical History:  Procedure Laterality Date   BRONCHIAL WASHINGS N/A 02/16/2023   Procedure: BRONCHIAL WASHINGS;  Surgeon: Parris Manna, MD;  Location: [facility] ORS;  Service: Thoracic;  Laterality: N/A;   CATARACT EXTRACTION W/PHACO Left 11/13/2022   Procedure: CATARACT EXTRACTION PHACO AND INTRAOCULAR LENS PLACEMENT (IOC) LEFT OMIDRIA    13.58  01:23.3;  Surgeon: Enola Feliciano Hugger, MD;  Location: [facility];  Service: Ophthalmology;  Laterality: Left;   CATARACT EXTRACTION W/PHACO Right 01/08/2023   Procedure: CATARACT EXTRACTION PHACO AND INTRAOCULAR LENS PLACEMENT (IOC) RIGHT OMIDRIA  14.23 01:16.5;  Surgeon: Enola Feliciano Hugger, MD;  Location: [facility];  Service: Ophthalmology;  Laterality: Right;   ESOPHAGOGASTRODUODENOSCOPY N/A  07/14/2023   Procedure: EGD (ESOPHAGOGASTRODUODENOSCOPY);  Surgeon: Maryruth Ole DASEN, MD;  Location: [facility] ENDOSCOPY;  Service: Endoscopy;  Laterality: N/A;   ESOPHAGOGASTRODUODENOSCOPY (EGD) WITH PROPOFOL  N/A 05/26/2018   Procedure: ESOPHAGOGASTRODUODENOSCOPY (EGD) WITH PROPOFOL ;  Surgeon: Janalyn Keene NOVAK, MD;  Location: [facility] ENDOSCOPY;  Service: Endoscopy;  Laterality: N/A;   ESOPHAGOGASTRODUODENOSCOPY (EGD) WITH PROPOFOL  N/A 12/28/2020   Procedure: ESOPHAGOGASTRODUODENOSCOPY (EGD) WITH PROPOFOL ;  Surgeon: Onita Elspeth Sharper, DO;  Location: [facility] ENDOSCOPY;  Service: Gastroenterology;  Laterality: N/A;   ESOPHAGOGASTRODUODENOSCOPY (EGD) WITH PROPOFOL  N/A 10/09/2021   Procedure: ESOPHAGOGASTRODUODENOSCOPY (EGD) WITH PROPOFOL ;  Surgeon: Therisa Bi, MD;  Location: [facility] ENDOSCOPY;  Service: Gastroenterology;  Laterality: N/A;   FLEXIBLE BRONCHOSCOPY N/A 02/16/2023   Procedure: FLEXIBLE BRONCHOSCOPY;  Surgeon: Parris Manna, MD;  Location: [facility] ORS;  Service: Thoracic;  Laterality: N/A;   INCISION AND DRAINAGE PERIRECTAL ABSCESS N/A 11/19/2015   Procedure: IRRIGATION AND DEBRIDEMENT PERIRECTAL ABSCESS and debridement of scrotal abscess;  Surgeon: Charlie FORBES Fell, MD;  Location: [facility] ORS;  Service: General;  Laterality: N/A;   INCISION AND DRAINAGE PERIRECTAL ABSCESS N/A 11/20/2015   Procedure: IRRIGATION AND DEBRIDEMENT PERIRECTAL ABSCESS / WITH DRESSING CHANGE;  Surgeon: Charlie FORBES Fell, MD;  Location: [facility] ORS;  Service: General;  Laterality: N/A;  peri rectal site   INCISION AND DRAINAGE PERIRECTAL ABSCESS N/A 11/21/2015   Procedure: IRRIGATION AND DEBRIDEMENT PERIRECTAL ABSCESS WITH DRESSING CHANGE;  Surgeon: Charlie FORBES Fell, MD;  Location: [facility] ORS;  Service: General;  Laterality: N/A;   MINOR HEMORRHOIDECTOMY  1989   TEE WITHOUT CARDIOVERSION N/A 11/20/2021   Procedure: TRANSESOPHAGEAL ECHOCARDIOGRAM (TEE);  Surgeon: Hester Wolm PARAS, MD;  Location: [facility] ORS;  Service: Cardiovascular;   Laterality: N/A;  8:00am   Social History:  reports that he quit smoking about 20 months ago. His smoking use included cigarettes. He started smoking about 39 years ago. He has a 19 pack-year smoking history. He has quit using smokeless tobacco. He reports that he does not currently use alcohol. He reports that he does not use drugs.  Allergies  Allergen Reactions   Adalat [Nifedipine]     Foot swelling.    Amlodipine  Other (See Comments)    Leg swelling    Family history reviewed and not pertinent Family History  Problem Relation Age of Onset   Hypertension Father      Prior to Admission medications   Medication Sig Start Date End Date Taking? Authorizing Provider  furosemide  (LASIX ) 20 MG tablet Take 20 mg by mouth daily. 12/14/23  Yes [provider]  HYDROcodone -acetaminophen  (NORCO/VICODIN) 5-325 MG tablet Take 1 tablet by mouth every 4 (four) hours as needed for moderate pain (pain score 4-6) or severe pain (pain score 7-10). 07/15/23  Yes Laurence Locus, DO  losartan  (COZAAR ) 100 MG tablet Take 1 tablet (100 mg total) by mouth daily. 05/13/21  Yes Karamalegos, Marsa PARAS, DO  metoprolol  succinate (TOPROL -XL) 25 MG 24 hr tablet Take 1 tablet (25 mg total) by mouth daily. 11/27/22  Yes Awanda City, MD  ondansetron  (ZOFRAN ) 4 MG tablet Take 1 tablet (4 mg total) by mouth every 8 (eight) hours as needed for nausea or vomiting. 10/01/22  Yes Therisa Bi, MD  potassium chloride  SA (KLOR-CON  M) 20 MEQ tablet Take 20 mEq by mouth daily. 08/17/23  Yes [provider]  ascorbic acid  (VITAMIN C ) 500 MG tablet Take 1 tablet (500 mg total) by mouth 2 (two) times daily. Patient not taking: Reported on 01/07/2024 07/15/23   Laurence Locus, DO  cholecalciferol  (CHOLECALCIFEROL ) 25 MCG tablet Take 1 tablet (1,000 Units total) by mouth daily. Patient not taking: Reported on 01/07/2024 07/16/23   Laurence Locus, DO  methocarbamol  (ROBAXIN ) 500 MG tablet Take 1 tablet (500 mg total) by mouth every 8  (eight) hours as needed for muscle spasms. Patient not taking: Reported on 01/07/2024 07/15/23   Laurence Locus, DO  ondansetron  (ZOFRAN ) 4 MG tablet Take 1 tablet (4 mg total) by mouth every 6 (six) hours as needed for nausea, refractory nausea / vomiting or vomiting. 07/15/23   Laurence Locus, DO  pantoprazole  (PROTONIX ) 40 MG tablet Take 1 tablet (40 mg total) by mouth 2 (two) times daily. Patient not taking: Reported on 01/07/2024 07/15/23 08/14/23  Laurence Locus, DO  prochlorperazine  (COMPAZINE ) 10 MG tablet Take 1 tablet (10 mg total) by mouth every 8 (eight) hours as needed for nausea or vomiting. Patient not taking: Reported on 01/07/2024 07/15/23   Laurence Locus, DO  thiamine  (VITAMIN B-1) 100 MG tablet Take 1 tablet (100 mg total) by mouth daily. Patient not taking: Reported on 01/07/2024 07/16/23   Laurence Locus, DO    Physical Exam: Vitals:   01/07/24 1037 01/07/24 1039 01/07/24 1437 01/07/24 1514  BP:    (!) 173/99  Pulse:  (!) 107  81  Resp:    18  Temp:   97.9 F (36.6 C) 98.1 F (36.7 C)  TempSrc:   Oral Oral  SpO2:  100%    Weight: 83.9 kg     Height: 6' 3 (1.905 m)       General: alert and oriented to time, place, and person. Appear in mild distress, affect flat in affect/rude  Eyes: PERRLA, Conjunctiva normal ENT: Oral Mucosa Clear, moist  Neck: no JVD, no Abnormal Mass Or lumps Cardiovascular: S1 and S2 Present, no Murmur,  Respiratory: good respiratory effort, Bilateral Air entry equal and Decreased, no signs of accessory muscle use, Clear to Auscultation, no Crackles, no wheezes Abdomen: Bowel Sound present, Soft and no tenderness. Skin: Erythema and tenderness bilateral upper and lower extremities Extremities: 4+ edema of b/l LE and LLE lateral erythematous tender patch possible trauma, looks infectious Neurologic: without any new focal findings Gait not checked due to patient safety concerns  Data Reviewed: I have personally reviewed and interpreted labs, imaging as discussed  below.  CBC: Recent Labs  Lab 01/07/24 1043  WBC 8.8  NEUTROABS 7.9*  HGB 12.3*  HCT 38.4*  MCV 100.5*  PLT 308   Basic Metabolic Panel: Recent Labs  Lab 01/07/24 1043  NA 134*  K 4.5  CL 103  CO2 20*  GLUCOSE 93  BUN 19  CREATININE 1.29*  CALCIUM 8.4*  MG 1.6*  PHOS 2.5   GFR: Estimated Creatinine Clearance: 59.6 mL/min (A) (by C-G formula based on SCr of 1.29 mg/dL (H)). Liver Function Tests: Recent Labs  Lab 01/07/24 1043  AST 22  ALT 16  ALKPHOS 79  BILITOT 1.0  PROT 5.7*  ALBUMIN 2.5*  No results for input(s): LIPASE, AMYLASE in the last 168 hours. No results for input(s): AMMONIA in the last 168 hours. Coagulation Profile: No results for input(s): INR, PROTIME in the last 168 hours. Cardiac Enzymes: No results for input(s): CKTOTAL, CKMB, CKMBINDEX, TROPONINI in the last 168 hours. BNP (last 3 results) No results for input(s): PROBNP in the last 8760 hours. HbA1C: No results for input(s): HGBA1C in the last 72 hours. CBG: No results for input(s): GLUCAP in the last 168 hours. Lipid Profile: No results for input(s): CHOL, HDL, LDLCALC, TRIG, CHOLHDL, LDLDIRECT in the last 72 hours. Thyroid Function Tests: No results for input(s): TSH, T4TOTAL, FREET4, T3FREE, THYROIDAB in the last 72 hours. Anemia Panel: Recent Labs    01/07/24 1043  TIBC 143*  IRON 13*   Urine analysis:    Component Value Date/Time   COLORURINE AMBER (A) 07/10/2023 1417   APPEARANCEUR HAZY (A) 07/10/2023 1417   APPEARANCEUR Clear 10/29/2021 1034   LABSPEC 1.020 07/10/2023 1417   PHURINE 6.0 07/10/2023 1417   GLUCOSEU 50 (A) 07/10/2023 1417   HGBUR SMALL (A) 07/10/2023 1417   BILIRUBINUR NEGATIVE 07/10/2023 1417   BILIRUBINUR Negative 10/29/2021 1034   KETONESUR 20 (A) 07/10/2023 1417   PROTEINUR 100 (A) 07/10/2023 1417   NITRITE NEGATIVE 07/10/2023 1417   LEUKOCYTESUR NEGATIVE 07/10/2023 1417    Radiological Exams on  Admission: DG Chest Portable 1 View Result Date: 01/07/2024 CLINICAL DATA:  Fluid overload. EXAM: PORTABLE CHEST 1 VIEW COMPARISON:  07/11/2023 FINDINGS: Normal-sized heart. Tortuous aorta. Clear lungs with normal vascularity. Thoracic spine degenerative changes with changes of DISH. IMPRESSION: No acute abnormality.  No radiographic evidence of fluid overload. Electronically Signed   By: Elspeth Bathe M.D.   On: 01/07/2024 12:33   EKG: Independently reviewed.  Sinus tachycardia, atrial arrhythmias   Echocardiogram: Pending  I reviewed all nursing notes, pharmacy notes, vitals, pertinent old records.  Assessment/Plan Principal Problem:   Anasarca  # Anasarca BNP 106.9 slightly elevated Started Lasix  IV infusion  Monitor renal functions and urine output  # Cellulitis of bilateral lower extremities and upper extremities? Clinically looks erythematous and tender WBC within normal range Empirically started antibiotics ceftriaxone  and vancomycin  Pharmacy consulted for Vanco dosing and trough monitoring De-escalate as per improvement Follow-up MRSA screening Follow blood cultures Follow-up venous duplex to rule out DVT  # Ischemic right first toe ulcer and ischemia of third toe possible underlying PAD Follow-up arterial duplex and ABI Follow-up with vascular surgery, patient may need angiogram Follow-up podiatry  # EtOH abuse As per patient he quit several years ago. Patient's daughter is concerned that he is also drinking alcohol but she is not aware how much.  Patient lives alone. Follow CIWA protocol, monitor for withdrawal symptoms Use Ativan  as needed Started multivitamin, thiamine  and folic acid . Check EtOH level and UDS  # AKI on CKD stage II Baseline sCr 0.81, eGFR >60 Scr 1.29 on admission Monitor renal functions and urine output as patient is on diuretics Check bladder scan to rule out urinary retention Consider nephrology consult if renal functions get worse.  #  Hypomagnesemia, mag repleted Monitor electrolytes and replete as needed.  # Iron deficiency, started oral iron supplement with vitamin C  Avoided IV iron for possible infection   # HTN, HFpEF Resumed losartan  and metoprolol  home dose Monitor BP and titrate medications accordingly Prior echo: LVEF 60 to 65%, moderate LVH Follow repeat TTE  # History of esophageal stricture and dilatation, chronic hiccups No active issues. Continue  PPI twice daily Thorazine  12.5 mg IV x 1 dose given for hiccups   Nutrition: Cardiac diet DVT Prophylaxis: Subcutaneous Lovenox   Advance goals of care discussion: Full code   Consults: Vascular surgery and podiatry via secure chat text  Family Communication: family was present at bedside, at the time of interview.  Opportunity was given to ask question and all questions were answered satisfactorily.  Disposition: Admitted as inpatient, telemetry unit. Likely to be discharged SNF, in few days when stable.  I have discussed plan of care as described above with RN and patient/family.  Severity of Illness: The appropriate patient status for this patient is INPATIENT. Inpatient status is judged to be reasonable and necessary in order to provide the required intensity of service to ensure the patient's safety. The patient's presenting symptoms, physical exam findings, and initial radiographic and laboratory data in the context of their chronic comorbidities is felt to place them at high risk for further clinical deterioration. Furthermore, it is not anticipated that the patient will be medically stable for discharge from the hospital within 2 midnights of admission.   * I certify that at the point of admission it is my clinical judgment that the patient will require inpatient hospital care spanning beyond 2 midnights from the point of admission due to high intensity of service, high risk for further deterioration and high frequency of surveillance required.*    Author: ELVAN SOR, MD [facility] 01/07/2024 3:53 PM   To reach On-call, see care teams to locate the attending and reach out to them via [email or web address]. If 7PM-7AM, please contact night-coverage If you still have difficulty reaching the attending provider, please page the [facility] (Director on Call) for [facility] on amion for assistance.

## 2024-01-07 NOTE — ED Provider Notes (Signed)
 [facility] Provider Note    Event Date/Time   First MD Initiated Contact with Patient 01/07/24 1106     (approximate)  History   Chief Complaint: Fall  HPI  John Booker is a 73 y.o. male with a past medical history of CHF, CKD, hypertension, lymphedema, presents to the emergency department for worsening swelling in weepage.  According to the patient and daughter for the last week or so he has had progressively worsening swelling of his upper and lower extremities.  Lower extremities are now diffusely weeping, which he states he has never had.  There is also an area of erythema to the lateral left lower extremity concerning for cellulitis.  No reported fever.  While the patient was trying to get ready to come to the emergency department he did fall, states landed on his buttocks.  Denies hitting his head.  No LOC.  No pain from the fall.  Physical Exam   Triage Vital Signs: ED Triage Vitals  Encounter Vitals Group     BP 01/07/24 1036 129/62     Girls Systolic BP Percentile --      Girls Diastolic BP Percentile --      Boys Systolic BP Percentile --      Boys Diastolic BP Percentile --      Pulse Rate 01/07/24 1039 (!) 107     Resp 01/07/24 1036 17     Temp 01/07/24 1036 98.2 F (36.8 C)     Temp Source 01/07/24 1036 Oral     SpO2 01/07/24 1039 100 %     Weight 01/07/24 1037 185 lb (83.9 kg)     Height 01/07/24 1037 6' 3 (1.905 m)     Head Circumference --      Peak Flow --      Pain Score 01/07/24 1037 10     Pain Loc --      Pain Education --      Exclude from Growth Chart --     Most recent vital signs: Vitals:   01/07/24 1036 01/07/24 1039  BP: 129/62   Pulse:  (!) 107  Resp: 17   Temp: 98.2 F (36.8 C)   SpO2:  100%    General: Awake, no distress.  CV:  Good peripheral perfusion.  Regular rate and rhythm  Resp:  Normal effort.  Equal breath sounds bilaterally.  Abd:  No distention.  Soft Other:  Significant swelling to  bilateral upper and lower extremities with blistering and several areas of the upper and lower extremities, weepage throughout the lower extremities with erythema and concerns for cellulitis in the left lower extremity.   ED Results / Procedures / Treatments   EKG  EKG viewed and interpreted by myself shows sinus tachycardia at 104 bpm with a narrow QRS, normal axis, normal intervals significant electrical interference but no obvious ST elevation.  RADIOLOGY  I have reviewed interpret the chest x-ray images.  No consolidation seen on my evaluation no significant fluid. Radiology is read the x-ray is negative.   MEDICATIONS ORDERED IN ED: Medications  furosemide  (LASIX ) injection 60 mg (has no administration in time range)     IMPRESSION / MDM / ASSESSMENT AND PLAN / ED COURSE  I reviewed the triage vital signs and the nursing notes.  Patient's presentation is most consistent with acute presentation with potential threat to life or bodily function.  Patient presents emergency department for worsening swelling in his upper and lower extremities.  Patient has significant lymphedema with significant weepage throughout his lower extremities blistering of several areas of the lower and upper extremities.  There is an area approximately 8 cm in diameter to the lateral left lower extremity concerning for possible cellulitis/superinfection.  Patient's lab work shows a reassuring CBC with a normal white blood cell count, chemistry is reassuring.  I have added on a BNP and troponin.  Will also obtain an EKG and a chest x-ray as a precaution.  We will dose IV Lasix .  Patient will require admission to the hospital service for IV diuresis.  We will send blood cultures and cover with Rocephin  for possible cellulitis in addition to the significant edema.  Reassuringly patient is satting 100% on room air.  Patient's CBC and chemistry showed no significant finding.  EKG shows significant interference as  the patient is having trouble holding still.  Chest x-ray appears clear with no fluid.  Will admit for IV diuresis and antibiotics.  FINAL CLINICAL IMPRESSION(S) / ED DIAGNOSES   Peripheral edema Cellulitis   Note:  This document was prepared using Dragon voice recognition software and may include unintentional dictation errors.   Dorothyann Drivers, MD 01/07/24 1311

## 2024-01-07 NOTE — Progress Notes (Signed)
 Pt left unit for ultrasound at 1620.  Pharmacy made aware of need to adjust times of antibiotics.  Will bladder scan per order upon patient return.

## 2024-01-07 NOTE — Progress Notes (Signed)

## 2024-01-07 NOTE — ED Notes (Signed)
 Lab was called to send phlebotomist after this RN was unsucessful at drawing blood cultures

## 2024-01-07 NOTE — ED Notes (Signed)
 IV team at bedside

## 2024-01-07 NOTE — Progress Notes (Signed)
 Pt transferred safely off unit with all belongings at bedside.

## 2024-01-07 NOTE — ED Notes (Signed)
 Lab at bedside for a second try at bloodwork

## 2024-01-07 NOTE — ED Notes (Signed)
 Admitting Dr notified that pt is asking for pain medicine that is stronger than tylenol 

## 2024-01-07 NOTE — Consult Note (Signed)
 Pharmacy Antibiotic Note  John Booker is a 73 y.o. male admitted on 01/07/2024 with lymphedema with increased weeping and concern for cellulitis.  Pharmacy has been consulted for vancomycin  dosing.  Scr 1.29 (elevated from baseline of 0.8-1.0)  Vancomycin  1000 mg IV x 1 given 10/30 @ 1409  Plan: Give additional vancomycin  1000 mg IV x 1 to complete 2000 mg loading dose Start vancomycin  1500 mg IV every 24 hours Estimated AUC 460.8, Cmin 10.2 83.9 kg, Scr 1.29, Vd 0.72 Vancomycin  levels at steady state or as clinically indicated Ceftriaxone  1 grams IV every 24 hours per provider Follow renal function and cultures for adjustments   Height: 6' 3 (190.5 cm) Weight: 83.9 kg (185 lb) IBW/kg (Calculated) : 84.5  Temp (24hrs), Avg:98.1 F (36.7 C), Min:97.9 F (36.6 C), Max:98.2 F (36.8 C)  Recent Labs  Lab 01/07/24 1043  WBC 8.8  CREATININE 1.29*    Estimated Creatinine Clearance: 59.6 mL/min (A) (by C-G formula based on SCr of 1.29 mg/dL (H)).    Allergies  Allergen Reactions   Adalat [Nifedipine]     Foot swelling.    Amlodipine  Other (See Comments)    Leg swelling    Antimicrobials this admission: vancomycin  10/30 >>  Ceftriaxone  10/30 >>   Microbiology results: 10/30 BCx: pending  Thank you for allowing pharmacy to be a part of this patient's care.  Kayla JULIANNA Blew, PharmD, BCPS 01/07/2024 2:46 PM

## 2024-01-07 NOTE — Consult Note (Incomplete)
 Hospital Consult    Reason for Consult:  Bilateral upper and lower extremity swelling Requesting Physician:  Dr Leita Blanch MD  MRN #:  [id number]  History of Present Illness: This is a 73 y.o. male with Past medical history of ETOH Abuse, HTN, HFpEF, CKD stage II, lymphedema of bilateral lower extremities, esophageal stricture s/p dilatation, chronic hiccups as reviewed from EMR, presented at [facility] ED with complaining of bilateral lower extremity and upper extremity edema which is gradually getting worse.  Patient's daughter in the emergency room was able to give us  his history.  She states that he has gradually been getting worse over the past week with swelling and weeping.  Upon exam patient is noted to have bilateral lower extremity +4 edema and swelling with weeping and necrotic toes on his right foot.  Patient's third toe is gangrenous with spots on his great toe and 2nd and 4th toes.  Left lower extremity on the lateral side has some trauma to it with an open weeping skin tear.  This appears to be new and not chronic.  Patient also endorses on my examination that he has had an upper arm swelling for over 5 years with keratotic skin that is erythemic.  He states his lower legs hurt and he is unable to walk but he is able to use his upper arms.  Vascular surgery was consulted to evaluate.  Past Medical History:  Diagnosis Date   (HFpEF) heart failure with preserved ejection fraction (HCC)    a.) TTE 04/11/2021: EF 60-65%, no RWMAs, G1DD, mild LAE, RVSF norm, asc Ao 38 mm; b.) TTE 11/18/2021: EF 60-65%, no RWMAs, mild LVH, G1DD, norm RVSH; c.) TEE 11/20/2021: EF 60-65%, no RWMAs, no LAA thrombus, triv MR, no valvular vegetation, no IAS; d.) TTE 11/24/2022: EF 60-65%, no RWMAs, mod LVH, norm RVSF   Alcohol abuse, in remission    Aortic atherosclerosis    Bacteremia due to Gram-negative bacteria (Klebsiella oxytoca)    a.) source unidentified; b.) admitted to [facility] 11/20/2021 - 12/28/2021    Bilateral lower extremity edema    BPH (benign prostatic hyperplasia)    Cholelithiasis    Chronic hiccups    CKD (chronic kidney disease), stage III (HCC)    DOE (dyspnea on exertion)    Esophageal dysmotility    GERD (gastroesophageal reflux disease)    Hemorrhoids    Hepatic cyst    History of bilateral cataract extraction 2024   History of hiatal hernia    History of MSSA bacteremia secondary to foot wounds, s/p treatment 9/13 - 12/28/21 02/18/2022   Hypertension    Mediastinal mass    Memory changes    Metabolic encephalopathy    a.) in the setting of bacteremia/sepsis   MSSA bacteremia 11/2021   a.) source was foot wounds and probable septic arthritis in the lumbar spine   Nephrolithiasis    Neuropathy    Non-traumatic rhabdomyolysis    Paraspinal mass    Renal cyst, right    Sigmoid diverticulosis     Past Surgical History:  Procedure Laterality Date   BRONCHIAL WASHINGS N/A 02/16/2023   Procedure: BRONCHIAL WASHINGS;  Surgeon: Parris Manna, MD;  Location: [facility] ORS;  Service: Thoracic;  Laterality: N/A;   CATARACT EXTRACTION W/PHACO Left 11/13/2022   Procedure: CATARACT EXTRACTION PHACO AND INTRAOCULAR LENS PLACEMENT (IOC) LEFT OMIDRIA    13.58  01:23.3;  Surgeon: Enola Feliciano Hugger, MD;  Location: [facility];  Service: Ophthalmology;  Laterality: Left;   CATARACT EXTRACTION  W/PHACO Right 01/08/2023   Procedure: CATARACT EXTRACTION PHACO AND INTRAOCULAR LENS PLACEMENT (IOC) RIGHT OMIDRIA  14.23 01:16.5;  Surgeon: Enola Feliciano Hugger, MD;  Location: [facility];  Service: Ophthalmology;  Laterality: Right;   ESOPHAGOGASTRODUODENOSCOPY N/A 07/14/2023   Procedure: EGD (ESOPHAGOGASTRODUODENOSCOPY);  Surgeon: Maryruth Ole DASEN, MD;  Location: [facility];  Service: Endoscopy;  Laterality: N/A;   ESOPHAGOGASTRODUODENOSCOPY (EGD) WITH PROPOFOL  N/A 05/26/2018   Procedure: ESOPHAGOGASTRODUODENOSCOPY (EGD) WITH PROPOFOL ;  Surgeon: Janalyn Keene NOVAK, MD;   Location: [facility] ENDOSCOPY;  Service: Endoscopy;  Laterality: N/A;   ESOPHAGOGASTRODUODENOSCOPY (EGD) WITH PROPOFOL  N/A 12/28/2020   Procedure: ESOPHAGOGASTRODUODENOSCOPY (EGD) WITH PROPOFOL ;  Surgeon: Onita Elspeth Sharper, DO;  Location: [facility];  Service: Gastroenterology;  Laterality: N/A;   ESOPHAGOGASTRODUODENOSCOPY (EGD) WITH PROPOFOL  N/A 10/09/2021   Procedure: ESOPHAGOGASTRODUODENOSCOPY (EGD) WITH PROPOFOL ;  Surgeon: Therisa Bi, MD;  Location: [facility] END[facility]COPY;  Service: Gastroenterology;  Laterality: N/A;   FLEXIBLE BRONCHOSCOPY N/A 02/16/2023   Procedure: FLEXIBLE BRONCHOSCOPY;  Surgeon: Parris Manna, MD;  Location: [facility];  Service: Thoracic;  Laterality: N/A;   INCISION AND DRAINAGE PERIRECTAL ABSCESS N/A 11/19/2015   Procedure: IRRIGATION AND DEBRIDEMENT PERIRECTAL ABSCESS and debridement of scrotal abscess;  Surgeon: Charlie FORBES Fell, MD;  Location: [facility];  Service: General;  Laterality: N/A;   INCISION AND DRAINAGE PERIRECTAL ABSCESS N/A 11/20/2015   Procedure: IRRIGATION AND DEBRIDEMENT PERIRECTAL ABSCESS / WITH DRESSING CHANGE;  Surgeon: Charlie FORBES Fell, MD;  Location: [facility];  Service: General;  Laterality: N/A;  peri rectal site   INCISION AND DRAINAGE PERIRECTAL ABSCESS N/A 11/21/2015   Procedure: IRRIGATION AND DEBRIDEMENT PERIRECTAL ABSCESS WITH DRESSING CHANGE;  Surgeon: Charlie FORBES Fell, MD;  Location: [facility] OR[facility];  Service: General;  Laterality: N/A;   MINOR HEMORRHOIDECTOMY  1989   TEE WITHOUT CARDIOVERSION N/A 11/20/2021   Procedure: TRANSESOPHAGEAL ECHOCARDIOGRAM (TEE);  Surgeon: Hester Wolm PARAS, MD;  Location: [facility] ORS;  Service: Cardiovascular;  Laterality: N/A;  8:00am    Allergies  Allergen Reactions   Adalat [Nifedipine]     Foot swelling.    Amlodipine  Other (See Comments)    Leg swelling    Prior to Admission medications   Medication Sig Start Date End Date Taking? Authorizing Provider  furosemide  (LASIX ) 20 MG tablet Take 20 mg by mouth daily.  12/14/23  Yes [provider]  HYDROcodone -acetaminophen  (NORCO/VICODIN) 5-325 MG tablet Take 1 tablet by mouth every 4 (four) hours as needed for moderate pain (pain score 4-6) or severe pain (pain score 7-10). 07/15/23  Yes Laurence Locus, DO  losartan  (COZAAR ) 100 MG tablet Take 1 tablet (100 mg total) by mouth daily. 05/13/21  Yes Karamalegos, Marsa PARAS, DO  metoprolol  succinate (TOPROL -XL) 25 MG 24 hr tablet Take 1 tablet (25 mg total) by mouth daily. 11/27/22  Yes Awanda City, MD  ondansetron  (ZOFRAN ) 4 MG tablet Take 1 tablet (4 mg total) by mouth every 8 (eight) hours as needed for nausea or vomiting. 10/01/22  Yes Therisa Bi, MD  potassium chloride  SA (KLOR-CON  M) 20 MEQ tablet Take 20 mEq by mouth daily. 08/17/23  Yes [provider]  ascorbic acid  (VITAMIN C ) 500 MG tablet Take 1 tablet (500 mg total) by mouth 2 (two) times daily. Patient not taking: Reported on 01/07/2024 07/15/23   Laurence Locus, DO  cholecalciferol  (CHOLECALCIFEROL ) 25 MCG tablet Take 1 tablet (1,000 Units total) by mouth daily. Patient not taking: Reported on 01/07/2024 07/16/23   Laurence Locus, DO  methocarbamol  (ROBAXIN ) 500 MG tablet Take 1 tablet (500 mg  total) by mouth every 8 (eight) hours as needed for muscle spasms. Patient not taking: Reported on 01/07/2024 07/15/23   Laurence Locus, DO  ondansetron  (ZOFRAN ) 4 MG tablet Take 1 tablet (4 mg total) by mouth every 6 (six) hours as needed for nausea, refractory nausea / vomiting or vomiting. 07/15/23   Laurence Locus, DO  pantoprazole  (PROTONIX ) 40 MG tablet Take 1 tablet (40 mg total) by mouth 2 (two) times daily. Patient not taking: Reported on 01/07/2024 07/15/23 08/14/23  Laurence Locus, DO  prochlorperazine  (COMPAZINE ) 10 MG tablet Take 1 tablet (10 mg total) by mouth every 8 (eight) hours as needed for nausea or vomiting. Patient not taking: Reported on 01/07/2024 07/15/23   Laurence Locus, DO  thiamine  (VITAMIN B-1) 100 MG tablet Take 1 tablet (100 mg total) by mouth daily. Patient  not taking: Reported on 01/07/2024 07/16/23   Laurence Locus, DO    Social History   Socioeconomic History   Marital status: Legally Separated    Spouse name: Not on file   Number of children: Not on file   Years of education: Not on file   Highest education level: Not on file  Occupational History   Not on file  Tobacco Use   Smoking status: Former    Current packs/day: 0.00    Average packs/day: 0.5 packs/day for 38.0 years (19.0 ttl pk-yrs)    Types: Cigarettes    Start date: 04/10/1984    Quit date: 04/10/2022    Years since quitting: 1.7   Smokeless tobacco: Former  Building Services Engineer status: Never Used  Substance and Sexual Activity   Alcohol use: Not Currently    Comment: several months,[place]   Drug use: No   Sexual activity: Not Currently  Other Topics Concern   Not on file  Social History Narrative   Not on file   Social Drivers of Health   Financial Resource Strain: Low Risk  (05/07/2023)   Received from [facility]   Overall Financial Resource Strain (CARDIA)    Difficulty of Paying Living Expenses: Not hard at all  Food Insecurity: No Food Insecurity (01/07/2024)   Hunger Vital Sign    Worried About Running Out of Food in the Last Year: Never true    Ran Out of Food in the Last Year: Never true  Transportation Needs: No Transportation Needs (01/07/2024)   PRAPARE - Administrator, Civil Service (Medical): No    Lack of Transportation (Non-Medical): No  Physical Activity: Not on file  Stress: Not on file  Social Connections: Socially Isolated (01/07/2024)   Social Connection and Isolation Panel    Frequency of Communication with Friends and Family: More than three times a week    Frequency of Social Gatherings with Friends and Family: Twice a week    Attends Religious Services: Never    Database Administrator or Organizations: No    Attends Banker Meetings: Never    Marital Status: Divorced  Catering Manager  Violence: Not At Risk (01/07/2024)   Humiliation, Afraid, Rape, and Kick questionnaire    Fear of Current or Ex-Partner: No    Emotionally Abused: No    Physically Abused: No    Sexually Abused: No     Family History  Problem Relation Age of Onset   Hypertension Father     ROS: Otherwise negative unless mentioned in HPI  Physical Examination  Vitals:   01/07/24 1437 01/07/24 1514  BP:  ROLLEN)  173/99  Pulse:  81  Resp:  18  Temp: 97.9 F (36.6 C) 98.1 F (36.7 C)  SpO2:     Body mass index is 23.12 kg/m.  General:  WDWN in NAD Gait: Not observed HENT: WNL, normocephalic Pulmonary: normal non-labored breathing, without Rales, rhonchi,  wheezing Cardiac: regular, without  Murmurs, rubs or gallops; without carotid bruits Abdomen: Positive bowel sounds throughout, soft, NT/ND, no masses Skin: with rashes Vascular Exam/Pulses: Palpable pulses in upper extremities. Unable to palpate pules in lower extremities due to +4 edema and swelling.  Extremities: with ischemic changes, with Gangrene , with cellulitis; without open wounds;  Musculoskeletal: no muscle wasting or atrophy  Neurologic: A&O X 3;  No focal weakness or paresthesias are detected; speech is fluent/normal Psychiatric:  The pt has Normal affect. Lymph:  Unremarkable  CBC    Component Value Date/Time   WBC 8.8 01/07/2024 1043   RBC 3.82 (L) 01/07/2024 1043   HGB 12.3 (L) 01/07/2024 1043   HGB 15.7 04/15/2012 2049   HCT 38.4 (L) 01/07/2024 1043   HCT 47.7 04/15/2012 2049   PLT 308 01/07/2024 1043   PLT 175 04/15/2012 2049   MCV 100.5 (H) 01/07/2024 1043   MCV 92 04/15/2012 2049   MCH 32.2 01/07/2024 1043   MCHC 32.0 01/07/2024 1043   RDW 12.7 01/07/2024 1043   RDW 13.9 04/15/2012 2049   LYMPHSABS 0.5 (L) 01/07/2024 1043   MONOABS 0.4 01/07/2024 1043   EOSABS 0.0 01/07/2024 1043   BASOSABS 0.0 01/07/2024 1043    BMET    Component Value Date/Time   NA 134 (L) 01/07/2024 1043   NA 135 (L) 04/15/2012  2049   K 4.5 01/07/2024 1043   K 3.5 04/15/2012 2049   CL 103 01/07/2024 1043   CL 98 04/15/2012 2049   CO2 20 (L) 01/07/2024 1043   CO2 30 04/15/2012 2049   GLUCOSE 93 01/07/2024 1043   GLUCOSE 107 (H) 04/15/2012 2049   BUN 19 01/07/2024 1043   BUN 17 04/15/2012 2049   CREATININE 1.29 (H) 01/07/2024 1043   CREATININE 1.15 12/20/2020 0946   CALCIUM 8.4 (L) 01/07/2024 1043   CALCIUM 8.5 04/15/2012 2049   GFRNONAA 58 (L) 01/07/2024 1043   GFRNONAA 73 12/20/2019 0916   GFRAA 85 12/20/2019 0916    COAGS: Lab Results  Component Value Date   INR 1.1 12/13/2022   INR 1.1 11/23/2022     Non-Invasive Vascular Imaging:  The following imaging has been ordered but not completed at this time. Bilateral upper and lower extremity venous and arterial ultrasounds to rule out DVT.  Patient will also undergo bilateral lower extremity ABIs looking for ASO.  Statin:  No. Beta Blocker:  Yes.   Aspirin :  No. ACEI:  No. ARB:  Yes.   CCB use:  No Other antiplatelets/anticoagulants:  No.    ASSESSMENT/PLAN: This is a 73 y.o. male who presents to [facility] emergency department with chief complaint of bilateral lower extremity and upper extremity edema which has progressively gotten worse over the last couple of weeks.  He is now weeping from his lower extremities.  Upon workup and exam he is noted to have a gangrenous third toe to his right foot as well as gangrenous areas to his great toe and 2nd and 4th toe.  Vascular surgery was consulted to evaluate.  It appears that the patient's been ordered bilateral lower extremity arterial duplex ultrasounds with ABIs as well as venous ultrasounds to check for DVTs.  I agree with this plan as a starting point.  They also ordered bilateral upper extremity venous ultrasounds to assess for DVTs.  I also agree with this is a starting point for his upper extremity swelling.  He will most likely need a right lower extremity angiogram due to the necrotic toes and  gangrene to his third toe but we will reassess after ultrasounds are completed.  Also to note is the patient is currently refusing any intervention.  I had to get him to agree to us  at least doing the ultrasounds to try to help his current condition.  Unfortunately he is not very agreeable to wanting to have any interventions done at this time.  Will readdress this again after we get the results from the ultrasounds.   -I discussed the plan with Dr. Selinda Gu MD and he agrees with the plan.   Gwendlyn JONELLE [facility]ank [facility] 01/07/2024 4:18 PM

## 2024-01-07 NOTE — Progress Notes (Addendum)
 Report called and given to Brittany, CHARITY FUNDRAISER on 2A.  Awaiting transport .

## 2024-01-08 ENCOUNTER — Inpatient Hospital Stay

## 2024-01-08 ENCOUNTER — Inpatient Hospital Stay: Admit: 2024-01-08

## 2024-01-08 DIAGNOSIS — R601 Generalized edema: Secondary | ICD-10-CM | POA: Diagnosis not present

## 2024-01-08 DIAGNOSIS — R600 Localized edema: Secondary | ICD-10-CM | POA: Diagnosis not present

## 2024-01-08 DIAGNOSIS — L03116 Cellulitis of left lower limb: Secondary | ICD-10-CM

## 2024-01-08 LAB — BASIC METABOLIC PANEL WITH GFR
Anion gap: 8 (ref 5–15)
BUN: 22 mg/dL (ref 8–23)
CO2: 23 mmol/L (ref 22–32)
Calcium: 8 mg/dL — ABNORMAL LOW (ref 8.9–10.3)
Chloride: 102 mmol/L (ref 98–111)
Creatinine, Ser: 1.54 mg/dL — ABNORMAL HIGH (ref 0.61–1.24)
GFR, Estimated: 47 mL/min — ABNORMAL LOW (ref >60)
Glucose, Bld: 87 mg/dL (ref 70–99)
Potassium: 4 mmol/L (ref 3.5–5.1)
Sodium: 133 mmol/L — ABNORMAL LOW (ref 135–145)

## 2024-01-08 LAB — CBC
HCT: 32.8 % — ABNORMAL LOW (ref 39.0–52.0)
Hemoglobin: 10.7 g/dL — ABNORMAL LOW (ref 13.0–17.0)
MCH: 32.2 pg (ref 26.0–34.0)
MCHC: 32.6 g/dL (ref 30.0–36.0)
MCV: 98.8 fL (ref 80.0–100.0)
Platelets: 230 K/uL (ref 150–400)
RBC: 3.32 MIL/uL — ABNORMAL LOW (ref 4.22–5.81)
RDW: 12.5 % (ref 11.5–15.5)
WBC: 6.5 K/uL (ref 4.0–10.5)
nRBC: 0 % (ref 0.0–0.2)

## 2024-01-08 LAB — PHOSPHORUS: Phosphorus: 2.8 mg/dL (ref 2.5–4.6)

## 2024-01-08 LAB — MAGNESIUM: Magnesium: 2.1 mg/dL (ref 1.7–2.4)

## 2024-01-08 LAB — AMMONIA: Ammonia: 16 umol/L (ref 9–35)

## 2024-01-08 MED ORDER — HYDRALAZINE HCL 25 MG PO TABS
25.0000 mg | ORAL_TABLET | Freq: Three times a day (TID) | ORAL | Status: DC
  Administered 2024-01-08 – 2024-01-18 (×23): 25 mg via ORAL
  Filled 2024-01-08 (×26): qty 1

## 2024-01-08 MED ORDER — VITAMIN D 25 MCG (1000 UNIT) PO TABS
1000.0000 [IU] | ORAL_TABLET | Freq: Every day | ORAL | Status: DC
  Administered 2024-01-08 – 2024-01-09 (×2): 1000 [IU] via ORAL
  Filled 2024-01-08 (×2): qty 1

## 2024-01-08 NOTE — Plan of Care (Signed)

## 2024-01-08 NOTE — Progress Notes (Signed)
 OT Cancellation Note  Patient Details Name: John Booker MRN: [id number] DOB: 09/12/1950   Cancelled Treatment:    Reason Eval/Treat Not Completed: Patient not medically ready. Consult received, chart reviewed. Pt pending additional work up and consults. Will follow acutely and re-attempt once plan of care has been established and ensure pt cleared for ADL/mobility.   Tanna Loeffler R., MPH, MS, OTR/L ascom [phone] 01/08/24, 8:58 AM

## 2024-01-08 NOTE — Progress Notes (Signed)
 PT Cancellation Note  Patient Details Name: BRANDELL MAREADY MRN: [id number] DOB: [birth year]   Cancelled Treatment:    Reason Eval/Treat Not Completed: Other (comment) (Will hold per MD. Will follow up next date as appropriate.)  Randine Essex, PT, MPT  Randine LULLA Essex 01/08/2024, 1:15 PM

## 2024-01-08 NOTE — Procedures (Signed)
 Pt was getting vascular US  at 1:40 pm --Christopher the echo tech spoke with pt daughter and told her ---he will be seen for echo tomm.

## 2024-01-08 NOTE — Progress Notes (Signed)
 OT Cancellation Note  Patient Details Name: John Booker MRN: [id number] DOB: 1950-03-22   Cancelled Treatment:    Reason Eval/Treat Not Completed: Other (comment). Hold therapy today per MD. Will re-attempt as appropriate.   [name], MPH, MS, OTR/L ascom [phone] 01/08/24, 12:41 PM

## 2024-01-08 NOTE — Progress Notes (Signed)
 [facility]  - [place] [facility]   PATIENT NAME: John Booker    MR#:  [id number]  DATE OF BIRTH:  06/01/1950  SUBJECTIVE:  spoke with patient's daughter on the phone. Patient not too keen on discussing his issues. He gets irritable when asked questions. He has been refusing to do imaging studies. Came in with increasing swelling of both lower extremities, weeping edema and lower extremity right third and fifth digit gangrene along with ulcers on the TBL [place] and ankle   VITALS:  Blood pressure 134/62, pulse 87, temperature 97.7 F (36.5 C), resp. rate 17, height 6' 3 (1.905 m), weight 83.9 kg, SpO2 100%.  PHYSICAL EXAMINATION:   GENERAL:  73 y.o.-year-old patient with no acute distress.  LUNGS: Normal breath sounds bilaterally, no wheezing CARDIOVASCULAR: S1, S2 normal. No murmur   ABDOMEN: Soft, nontender, nondistended. Bowel sounds present.  EXTREMITIES:  NEUROLOGIC: nonfocal  patient is alert and awake   LABORATORY PANEL:  CBC Recent Labs  Lab 01/08/24 0405  WBC 6.5  HGB 10.7*  HCT 32.8*  PLT 230    Chemistries  Recent Labs  Lab 01/07/24 1043 01/08/24 0405  NA 134* 133*  K 4.5 4.0  CL 103 102  CO2 20* 23  GLUCOSE 93 87  BUN 19 22  CREATININE 1.29* 1.54*  CALCIUM 8.4* 8.0*  MG 1.6* 2.1  AST 22  --   ALT 16  --   ALKPHOS 79  --   BILITOT 1.0  --    Cardiac Enzymes No results for input(s): TROPONINI in the last 168 hours. RADIOLOGY:  DG Chest Portable 1 View Result Date: 01/07/2024 CLINICAL DATA:  Fluid overload. EXAM: PORTABLE CHEST 1 VIEW COMPARISON:  07/11/2023 FINDINGS: Normal-sized heart. Tortuous aorta. Clear lungs with normal vascularity. Thoracic spine degenerative changes with changes of DISH. IMPRESSION: No acute abnormality.  No radiographic evidence of fluid overload. Electronically Signed   By: John Booker M.D.   On: 01/07/2024 12:33    Assessment and Plan  John Booker is a 73 y.o. male with Past medical  history of HTN, HFpEF, CKD stage II, lymphedema of bilateral lower extremities, esophageal stricture s/p dilatation, chronic hiccups as reviewed from EMR, presented at [facility] ED with complaining of bilateral lower extremity and upper extremity edema which is gradually getting worse.  Patient is not a very good historian, as per patient's daughter lower extremity edema is gradually getting worse for the past 1 week and it is weeping.  There is an area of erythema in the left lower extremity on the lateral side which he scratched maybe had trauma.   # Anasarca\Hypoalbuminemia BNP 106.9 slightly elevated --Started Lasix  IV infusion  --Monitor renal functions and urine output   # Cellulitis of bilateral lower extremities and upper extremities? Clinically looks erythematous and tender WBC within normal range Empirically started antibiotics ceftriaxone  and vancomycin  Pharmacy consulted for Vanco dosing and trough monitoring De-escalate as per improvement Follow-up MRSA screening Follow blood cultures Follow-up venous duplex to rule out DVT   # Ischemic right first toe ulcer and ischemia of third toe possible underlying PAD Follow-up arterial duplex and ABI Follow-up with vascular surgery, patient may need angiogram Follow-up podiatry   # EtOH abuse As per patient he quit several years ago. Patient's daughter is concerned that he is also drinking alcohol but she is not aware how much.  Patient lives alone. Follow CIWA protocol, monitor for withdrawal symptoms Use Ativan  as needed Started multivitamin, thiamine  and  folic acid . Check EtOH level and UDS   # AKI on CKD stage II Baseline sCr 0.81, eGFR >60 Scr 1.29 on admission Monitor renal functions and urine output as patient is on diuretics Check bladder scan to rule out urinary retention Consider nephrology consult if renal functions get worse.   # Hypomagnesemia, mag repleted Monitor electrolytes and replete as needed.   # Iron  deficiency, started oral iron supplement with vitamin C  Avoided IV iron for possible infection     # HTN, HFpEF Resumed losartan  and metoprolol  home dose Monitor BP and titrate medications accordingly Prior echo: LVEF 60 to 65%, moderate LVH Follow repeat TTE   # History of esophageal stricture and dilatation, chronic hiccups No active issues. Continue PPI twice daily Thorazine  12.5 mg IV x 1 dose given for hiccups     Nutrition: Cardiac diet DVT Prophylaxis: Subcutaneous Lovenox     Procedures: Family communication :dter Consults : podiatry, vascular surgery CODE STATUS: DNR DVT Prophylaxis : Lovenox  Level of care: Progressive Status is: Inpatient Remains inpatient appropriate because: bilateral lower extremity edema, cellulitis    TOTAL TIME TAKING CARE OF THIS PATIENT: 40 minutes.  >50% time spent on counselling and coordination of care  Note: This dictation was prepared with Dragon dictation along with smaller phrase technology. Any transcriptional errors that result from this process are unintentional.  John Booker M.D    [facility]   CC: Primary care physician; John Amis, MD

## 2024-01-08 NOTE — Consult Note (Signed)
 Pharmacy Antibiotic Note  John Booker is a 73 y.o. male admitted on 01/07/2024 with lymphedema with increased weeping and concern for cellulitis.  Pharmacy has been consulted for vancomycin  dosing.  Scr 1.29 (elevated from baseline of 0.8-1.0) > 1.54 today  Plan: Start vancomycin  1500 mg IV every 24 hours Estimated AUC 541 Estimated Cmin 13.3 83.9 kg, Scr 1.54, Vd 0.72 Vancomycin  levels at steady state or as clinically indicated Ceftriaxone  1 grams IV every 24 hours per provider Follow renal function and cultures for adjustments   Height: 6' 3 (190.5 cm) Weight: 83.9 kg (185 lb) IBW/kg (Calculated) : 84.5  Temp (24hrs), Avg:98.3 F (36.8 C), Min:97.6 F (36.4 C), Max:99.1 F (37.3 C)  Recent Labs  Lab 01/07/24 1043 01/08/24 0405  WBC 8.8 6.5  CREATININE 1.29* 1.54*    Estimated Creatinine Clearance: 49.9 mL/min (A) (by C-G formula based on SCr of 1.54 mg/dL (H)).    Allergies  Allergen Reactions   Adalat [Nifedipine]     Foot swelling.    Amlodipine  Other (See Comments)    Leg swelling    Antimicrobials this admission: vancomycin  10/30 >>  Ceftriaxone  10/30 >>   Microbiology results: 10/30 BCx: in process  Thank you for allowing pharmacy to be a part of this patient's care.  Javoni Lucken Rodriguez-Guzman PharmD, BCPS 01/08/2024 11:43 AM

## 2024-01-09 ENCOUNTER — Inpatient Hospital Stay

## 2024-01-09 ENCOUNTER — Inpatient Hospital Stay
Admit: 2024-01-09 | Discharge: 2024-01-09 | Disposition: A | Discharge status: Home / Self Care | Attending: Student | Admitting: Student

## 2024-01-09 DIAGNOSIS — I5031 Acute diastolic (congestive) heart failure: Secondary | ICD-10-CM | POA: Diagnosis not present

## 2024-01-09 DIAGNOSIS — R601 Generalized edema: Secondary | ICD-10-CM | POA: Diagnosis not present

## 2024-01-09 LAB — BASIC METABOLIC PANEL WITH GFR
Anion gap: 10 (ref 5–15)
BUN: 23 mg/dL (ref 8–23)
CO2: 27 mmol/L (ref 22–32)
Calcium: 8.4 mg/dL — ABNORMAL LOW (ref 8.9–10.3)
Chloride: 97 mmol/L — ABNORMAL LOW (ref 98–111)
Creatinine, Ser: 1.58 mg/dL — ABNORMAL HIGH (ref 0.61–1.24)
GFR, Estimated: 46 mL/min — ABNORMAL LOW (ref >60)
Glucose, Bld: 125 mg/dL — ABNORMAL HIGH (ref 70–99)
Potassium: 3.3 mmol/L — ABNORMAL LOW (ref 3.5–5.1)
Sodium: 134 mmol/L — ABNORMAL LOW (ref 135–145)

## 2024-01-09 LAB — VANCOMYCIN, RANDOM: Vancomycin Rm: 19 ug/mL

## 2024-01-09 MED ORDER — DILTIAZEM HCL 25 MG/5ML IV SOLN
5.0000 mg | Freq: Once | INTRAVENOUS | Status: AC
  Administered 2024-01-09: 5 mg via INTRAVENOUS
  Filled 2024-01-09: qty 5

## 2024-01-09 MED ORDER — METOPROLOL TARTRATE 5 MG/5ML IV SOLN
2.5000 mg | Freq: Once | INTRAVENOUS | Status: AC
  Administered 2024-01-09: 2.5 mg via INTRAVENOUS
  Filled 2024-01-09: qty 5

## 2024-01-09 MED ORDER — ASPIRIN 81 MG PO TBEC
81.0000 mg | DELAYED_RELEASE_TABLET | Freq: Every day | ORAL | Status: DC
  Administered 2024-01-10: 81 mg via ORAL
  Filled 2024-01-09 (×2): qty 1

## 2024-01-09 MED ORDER — DIPHENOXYLATE-ATROPINE 2.5-0.025 MG PO TABS
1.0000 | ORAL_TABLET | Freq: Four times a day (QID) | ORAL | Status: DC | PRN
  Administered 2024-01-09 – 2024-01-18 (×4): 1 via ORAL
  Filled 2024-01-09 (×7): qty 1

## 2024-01-09 MED ORDER — SODIUM CHLORIDE 0.9 % IV BOLUS
250.0000 mL | Freq: Once | INTRAVENOUS | Status: AC
  Administered 2024-01-09: 250 mL via INTRAVENOUS

## 2024-01-09 MED ORDER — VANCOMYCIN VARIABLE DOSE PER UNSTABLE RENAL FUNCTION (PHARMACIST DOSING): Status: DC

## 2024-01-09 NOTE — Evaluation (Signed)
 Physical Therapy Evaluation Patient Details Name: John Booker MRN: [id number] DOB: 01-31-1951 Today's Date: 01/09/2024  History of Present Illness  Pt is a 73 y/o M admitted on 01/07/24 following a fall, c/o worsening BLE & BUE edema. Pt is being treated for anasarca, BLE cellulitis, ischemic R 1st toe ulcer & ischemia of 3rd toe. PMH: HTN, HFpEF, CKD 2, lymphedema of BLE, chronic hiccups, EtOH abuse  Clinical Impression  Pt seen for PT evaluation with pt received in bed. Pt reports prior to admission he was residing alone in a 1 story apartment with steps to enter, notes he started using a walker ~6 months ago 2/2 increasing weakness. Pt presents with significant edema & erythema to BLE, weeping wounds as well. Pt unwilling to attempt sitting EOB despite education re: +2 assist & benefits of mobility, but does attempt to move BLE in bed but pt limited by pain & weakness. Will continue to follow pt acutely to progress mobility as able. Recommend post acute rehab <3 hours therapy/day upon d/c; pt reports he does not have 24 hr assist & pt is not safe to d/c home alone.      If plan is discharge home, recommend the following: Two people to help with walking and/or transfers;Two people to help with bathing/dressing/bathroom   Can travel by private vehicle   No    Equipment Recommendations  (defer to next venue)  Recommendations for Other Services  Rehab consult    Functional Status Assessment Patient has had a recent decline in their functional status and demonstrates the ability to make significant improvements in function in a reasonable and predictable amount of time.     Precautions / Restrictions Precautions Precautions: Fall Restrictions Weight Bearing Restrictions Per Provider Order: Yes Other Position/Activity Restrictions: per secure chat with MDs, pt can WBAT to bathroom      Mobility  Bed Mobility                    Transfers                         Ambulation/Gait                  Stairs            Wheelchair Mobility     Tilt Bed    Modified Rankin (Stroke Patients Only)       Balance                                             Pertinent Vitals/Pain Pain Assessment Pain Assessment: Faces Faces Pain Scale: Hurts worst Pain Location: BLE with movement Pain Descriptors / Indicators: Grimacing, Discomfort, Guarding Pain Intervention(s): Monitored during session, Limited activity within patient's tolerance    Home Living Family/patient expects to be discharged to:: Private residence Living Arrangements: Alone Available Help at Discharge: Family;Available PRN/intermittently Type of Home: House Home Access: Stairs to enter Entrance Stairs-Rails: Right;Left Entrance Stairs-Number of Steps: 7   Home Layout: One level Home Equipment: [employer] (2 wheels);Shower seat      Prior Function Prior Level of Function : Independent/Modified Independent;History of Falls (last six months);Driving             Mobility Comments: Pt reports he began using RW about 6 months ago 2/2 weakness ADLs Comments: Reports increasing difficulties  with ADLs, recently started sponge bathing, notes he still drives to go to the grocery store unlesss his daughter can bring him some.     Extremity/Trunk Assessment   Upper Extremity Assessment Upper Extremity Assessment: Defer to OT evaluation;Right hand dominant (significant RUE edema) RUE Deficits / Details: edema around R elbow; AROM; shoulder flexion to approx 3/4 full AROM, elbow/wrist/hand grossly WFL RUE: Unable to fully assess due to pain LUE Deficits / Details: AROM; shoulder flexion to approx 3/4 full AROM, elbow/wrist/hand grossly WFL LUE: Unable to fully assess due to pain    Lower Extremity Assessment Lower Extremity Assessment: Generalized weakness;RLE deficits/detail;LLE deficits/detail RLE Deficits / Details: 1/5 muscle activation  noted in quads, hip flexors, ankle dorsiflexors but pt also reports he's limited 2/2 pain; BLE redness & weeping wounds LLE Deficits / Details: 1/5 muscle activation noted in quads, hip flexors, ankle dorsiflexors but pt also reports he's limited 2/2 pain; BLE redness & weeping wounds       Communication   Communication Communication: Impaired Factors Affecting Communication: Hearing impaired    Cognition Arousal: Alert Behavior During Therapy: Agitated   PT - Cognitive impairments: No family/caregiver present to determine baseline                       PT - Cognition Comments: Reports he will do whatever he has to, will participate, but does not, & demonstrates decreased understanding re: importance of mobility. Following commands: Impaired Following commands impaired: Follows one step commands with increased time     Cueing Cueing Techniques: Verbal cues, Gestural cues, Tactile cues, Visual cues     General Comments General comments (skin integrity, edema, etc.): wounds and edema throughout BLE, edema around R elbow    Exercises     Assessment/Plan    PT Assessment Patient needs continued PT services  PT Problem List Decreased strength;Decreased coordination;Pain;Decreased range of motion;Decreased cognition;Decreased balance;Decreased mobility;Decreased safety awareness;Decreased knowledge of use of DME;Decreased activity tolerance;Decreased skin integrity       PT Treatment Interventions DME instruction;Neuromuscular re-education;Gait training;Balance training;Stair training;Cognitive remediation;Therapeutic activities;Manual techniques;Therapeutic exercise;Functional mobility training;Patient/family education;Wheelchair mobility training    PT Goals (Current goals can be found in the Care Plan section)  Acute Rehab PT Goals Patient Stated Goal: decreased pain, get better PT Goal Formulation: With patient Time For Goal Achievement: 01/23/24 Potential to  Achieve Goals: Fair    Frequency Min 2X/week     Co-evaluation PT/OT/SLP Co-Evaluation/Treatment: Yes Reason for Co-Treatment:  (pt's decreased tolerance) PT goals addressed during session: Strengthening/ROM         AM-PAC PT 6 Clicks Mobility  Outcome Measure Help needed turning from your back to your side while in a flat bed without using bedrails?: A Little Help needed moving from lying on your back to sitting on the side of a flat bed without using bedrails?: A Lot Help needed moving to and from a bed to a chair (including a wheelchair)?: Total Help needed standing up from a chair using your arms (e.g., wheelchair or bedside chair)?: Total Help needed to walk in hospital room?: Total Help needed climbing 3-5 steps with a railing? : Total 6 Click Score: 9    End of Session   Activity Tolerance: Patient limited by pain Patient left: in bed;with bed alarm set;with call bell/phone within reach Nurse Communication: Mobility status PT Visit Diagnosis: Muscle weakness (generalized) (M62.81);Pain;Difficulty in walking, not elsewhere classified (R26.2) Pain - Right/Left:  (bilateral) Pain - part of body: Leg  Time: 8891-8879 PT Time Calculation (min) (ACUTE ONLY): 12 min   Charges:   PT Evaluation $PT Eval Moderate Complexity: 1 Mod   PT General Charges $$ ACUTE PT VISIT: 1 Visit         [name], PT, DPT 01/09/24, 12:20 PM   [name] 01/09/2024, 12:18 PM

## 2024-01-09 NOTE — Evaluation (Signed)
 Occupational Therapy Evaluation Patient Details Name: John Booker MRN: [id number] DOB: 29-Aug-1950 Today's Date: 01/09/2024   History of Present Illness   Pt is a 73 year old male presented at [facility] ED with complaining of bilateral lower extremity and upper extremity edema which is gradually getting worse, admitted with Anasarca\Hypoalbuminemia, Cellulitis of bilateral lower extremities and upper extremities?, Ischemic right first toe ulcer and ischemia of third toe possible underlying PAD    PMH significant for HTN, HFpEF, CKD stage II, lymphedema of bilateral lower extremities, esophageal stricture s/p dilatation, chronic hiccups, EtOH abuse     Clinical Impressions Chart reviewed to date, pt greeted semi supine in bed, agitated at times but agreeable to initial OT evaluation at first. PTA he reports he is generally MOD I with ADL/IADL, amb limited distances around the house with continued recent difficulties/weakness over the last few weeks. He reports he has been driving to the grocery store however. Pt is grossly weak, unable to perform SLR either extremity. He declines edge of bed/bed mobility despite max education re: importance of mobility. He requires TOTAL A for LB dressing. Anticipate +2 for edge of bed activity at this time. Pt is left as received, all needs met. OT will follow.     If plan is discharge home, recommend the following:   Two people to help with walking and/or transfers;Two people to help with bathing/dressing/bathroom;Supervision due to cognitive status     Functional Status Assessment   Patient has had a recent decline in their functional status and demonstrates the ability to make significant improvements in function in a reasonable and predictable amount of time.     Equipment Recommendations   Other (comment) (defer to next venue of care)     Recommendations for Other Services         Precautions/Restrictions   Precautions Precautions:  Fall Recall of Precautions/Restrictions: Intact Restrictions Weight Bearing Restrictions Per Provider Order: No     Mobility Bed Mobility               General bed mobility comments: anticipate MAX-TOTAL A +2, pt declined despite max multi modal cues for encouragement    Transfers                          Balance                                           ADL either performed or assessed with clinical judgement   ADL Overall ADL's : Needs assistance/impaired Eating/Feeding: Supervision/ safety;Bed level Eating/Feeding Details (indicate cue type and reason): anticipate Grooming: Supervision/safety;Bed level Grooming Details (indicate cue type and reason): anticipate             Lower Body Dressing: Total assistance;Bed level                 General ADL Comments: pt declined participation in offered ADLs on this date     Vision Patient Visual Report: No change from baseline       Perception         Praxis         Pertinent Vitals/Pain Pain Assessment Pain Assessment: Faces Faces Pain Scale: Hurts worst Pain Location: BLE with movement Pain Descriptors / Indicators: Grimacing, Discomfort Pain Intervention(s): Repositioned, Monitored during session     Extremity/Trunk Assessment Upper Extremity Assessment  Upper Extremity Assessment: Generalized weakness;Right hand dominant;RUE deficits/detail;LUE deficits/detail RUE Deficits / Details: edema around R elbow; AROM; shoulder flexion to approx 3/4 full AROM, elbow/wrist/hand grossly WFL RUE: Unable to fully assess due to pain LUE Deficits / Details: AROM; shoulder flexion to approx 3/4 full AROM, elbow/wrist/hand grossly WFL LUE: Unable to fully assess due to pain   Lower Extremity Assessment Lower Extremity Assessment: Defer to PT evaluation (weeping, wounds noted throughout BLE)       Communication Communication Communication: Impaired Factors Affecting  Communication: Hearing impaired   Cognition Arousal: Alert Behavior During Therapy: Agitated Cognition: Cognition impaired     Awareness: Intellectual awareness impaired, Online awareness impaired   Attention impairment (select first level of impairment): Selective attention Executive functioning impairment (select all impairments): Reasoning, Problem solving                   Following commands: Impaired Following commands impaired: Follows one step commands with increased time     Cueing  General Comments   Cueing Techniques: Verbal cues;Gestural cues;Tactile cues;Visual cues  wounds and edema throughout BLE, edema around R elbow   Exercises Other Exercises Other Exercises: edu re role of OT, role of rehab, importance of continued mobility efforts   Shoulder Instructions      Home Living Family/patient expects to be discharged to:: Private residence Living Arrangements: Alone Available Help at Discharge: Family;Available PRN/intermittently Type of Home: House Home Access: Stairs to enter [employer] of Steps: 7 Entrance Stairs-Rails: Right;Left Home Layout: One level     Bathroom Shower/Tub: Tub/shower unit;Sponge bathes at baseline   [employer]: Standard     Home Equipment: Agricultural Consultant (2 wheels);Shower seat          Prior Functioning/Environment Prior Level of Function : Independent/Modified Independent;History of Falls (last six months);Driving             Mobility Comments: pt reports inability to amb recently, mostly to bathroom and back with RW ADLs Comments: pt reports recent difficulties with ADL/IADL, he reports he is still driving to the grocery store    OT Problem List: Decreased activity tolerance;Impaired balance (sitting and/or standing);Decreased strength;Decreased knowledge of use of DME or AE;Decreased knowledge of precautions   OT Treatment/Interventions: Self-care/ADL training;Balance training;Therapeutic  exercise;Therapeutic activities;DME and/or AE instruction;Patient/family education      OT Goals(Current goals can be found in the care plan section)   Acute Rehab OT Goals Patient Stated Goal: go home OT Goal Formulation: With patient Time For Goal Achievement: 01/23/24 Potential to Achieve Goals: Fair ADL Goals Pt Will Perform Grooming: with supervision;sitting Pt Will Perform Lower Body Dressing: with mod assist;sitting/lateral leans Pt Will Transfer to Toilet: with mod assist;stand pivot transfer Pt Will Perform Toileting - Clothing Manipulation and hygiene: with mod assist;sitting/lateral leans;sit to/from stand   OT Frequency:  Min 2X/week    Co-evaluation PT/OT/SLP Co-Evaluation/Treatment: Yes Reason for Co-Treatment:  (tolerance)          AM-PAC OT 6 Clicks Daily Activity     Outcome Measure Help from another person eating meals?: None Help from another person taking care of personal grooming?: A Little Help from another person toileting, which includes using toliet, bedpan, or urinal?: Total Help from another person bathing (including washing, rinsing, drying)?: Total Help from another person to put on and taking off regular upper body clothing?: A Lot Help from another person to put on and taking off regular lower body clothing?: Total 6 Click Score: 12   End of  Session Nurse Communication: Mobility status  Activity Tolerance: Treatment limited secondary to agitation;Patient limited by pain Patient left: in bed;with call bell/phone within reach;with bed alarm set  OT Visit Diagnosis: Muscle weakness (generalized) (M62.81);Other abnormalities of gait and mobility (R26.89)                Time: 8891-8877 OT Time Calculation (min): 14 min Charges:  OT General Charges $OT Visit: 1 Visit OT Evaluation $OT Eval Moderate Complexity: 1 Mod  Therisa Sheffield, OTD OTR/L  01/09/24, 11:42 AM

## 2024-01-09 NOTE — Consult Note (Signed)
 ORTHOPAEDIC CONSULTATION  REQUESTING PHYSICIAN: Tobie Calix, MD  Chief Complaint: Gangrene right forefoot  HPI: John Booker is a 73 y.o. male who complains of worsening gangrenous changes to his right forefoot.  Admitted with severe edema.  Undergoing diuresis at this time.  Upon admission noted gangrenous changes mainly of the distal right great toe and third toe.  Vascular evaluation has been performed.  Patient refused ABIs.  He is complaining of diffuse pain to both lower extremities at this time.  Past Medical History:  Diagnosis Date   (HFpEF) heart failure with preserved ejection fraction (HCC)    a.) TTE 04/11/2021: EF 60-65%, no RWMAs, G1DD, mild LAE, RVSF norm, asc Ao 38 mm; b.) TTE 11/18/2021: EF 60-65%, no RWMAs, mild LVH, G1DD, norm RVSH; c.) TEE 11/20/2021: EF 60-65%, no RWMAs, no LAA thrombus, triv MR, no valvular vegetation, no IAS; d.) TTE 11/24/2022: EF 60-65%, no RWMAs, mod LVH, norm RVSF   Alcohol abuse, in remission    Aortic atherosclerosis    Bacteremia due to Gram-negative bacteria (Klebsiella oxytoca)    a.) source unidentified; b.) admitted to [facility] 11/20/2021 - 12/28/2021   Bilateral lower extremity edema    BPH (benign prostatic hyperplasia)    Cholelithiasis    Chronic hiccups    CKD (chronic kidney disease), stage III (HCC)    DOE (dyspnea on exertion)    Esophageal dysmotility    GERD (gastroesophageal reflux disease)    Hemorrhoids    Hepatic cyst    History of bilateral cataract extraction 2024   History of hiatal hernia    History of MSSA bacteremia secondary to foot wounds, s/p treatment 9/13 - 12/28/21 02/18/2022   Hypertension    Mediastinal mass    Memory changes    Metabolic encephalopathy    a.) in the setting of bacteremia/sepsis   MSSA bacteremia 11/2021   a.) source was foot wounds and probable septic arthritis in the lumbar spine   Nephrolithiasis    Neuropathy    Non-traumatic rhabdomyolysis    Paraspinal mass    Renal cyst,  right    Sigmoid diverticulosis    Past Surgical History:  Procedure Laterality Date   BRONCHIAL WASHINGS N/A 02/16/2023   Procedure: BRONCHIAL WASHINGS;  Surgeon: Parris Manna, MD;  Location: [facility] ORS;  Service: Thoracic;  Laterality: N/A;   CATARACT EXTRACTION W/PHACO Left 11/13/2022   Procedure: CATARACT EXTRACTION PHACO AND INTRAOCULAR LENS PLACEMENT (IOC) LEFT OMIDRIA    13.58  01:23.3;  Surgeon: Enola Feliciano Hugger, MD;  Location: [facility];  Service: Ophthalmology;  Laterality: Left;   CATARACT EXTRACTION W/PHACO Right 01/08/2023   Procedure: CATARACT EXTRACTION PHACO AND INTRAOCULAR LENS PLACEMENT (IOC) RIGHT OMIDRIA  14.23 01:16.5;  Surgeon: Enola Feliciano Hugger, MD;  Location: [facility];  Service: Ophthalmology;  Laterality: Right;   ESOPHAGOGASTRODUODENOSCOPY N/A 07/14/2023   Procedure: EGD (ESOPHAGOGASTRODUODENOSCOPY);  Surgeon: Maryruth Ole DASEN, MD;  Location: [facility] END[facility];  Service: Endoscopy;  Laterality: N/A;   ESOPHAGOGASTRODUODENOSCOPY (EGD) WITH PROPOFOL  N/A 05/26/2018   Procedure: ESOPHAGOGASTRODUODENOSCOPY (EGD) WITH PROPOFOL ;  Surgeon: Janalyn Keene NOVAK, MD;  Location: [facility] ENDOSCOPY;  Service: Endoscopy;  Laterality: N/A;   ESOPHAGOGASTRODUODENOSCOPY (EGD) WITH PROPOFOL  N/A 12/28/2020   Procedure: ESOPHAGOGASTRODUODENOSCOPY (EGD) WITH PROPOFOL ;  Surgeon: Onita Elspeth Sharper, DO;  Location: [facility] END[facility]COPY;  Service: Gastroenterology;  Laterality: N/A;   ESOPHAGOGASTRODUODENOSCOPY (EGD) WITH PROPOFOL  N/A 10/09/2021   Procedure: ESOPHAGOGASTRODUODENOSCOPY (EGD) WITH PROPOFOL ;  Surgeon: Therisa Bi, MD;  Location: [facility] ENDOSCOPY;  Service: Gastroenterology;  Laterality: N/A;  FLEXIBLE BRONCHOSCOPY N/A 02/16/2023   Procedure: FLEXIBLE BRONCHOSCOPY;  Surgeon: Parris Manna, MD;  Location: [facility] ORS;  Service: Thoracic;  Laterality: N/A;   INCISION AND DRAINAGE PERIRECTAL ABSCESS N/A 11/19/2015   Procedure: IRRIGATION AND DEBRIDEMENT PERIRECTAL  ABSCESS and debridement of scrotal abscess;  Surgeon: Charlie FORBES Fell, MD;  Location: [facility] OR[facility];  Service: General;  Laterality: N/A;   INCISION AND DRAINAGE PERIRECTAL ABSCESS N/A 11/20/2015   Procedure: IRRIGATION AND DEBRIDEMENT PERIRECTAL ABSCESS / WITH DRESSING CHANGE;  Surgeon: Charlie FORBES Fell, MD;  Location: [facility];  Service: General;  Laterality: N/A;  peri rectal site   INCISION AND DRAINAGE PERIRECTAL ABSCESS N/A 11/21/2015   Procedure: IRRIGATION AND DEBRIDEMENT PERIRECTAL ABSCESS WITH DRESSING CHANGE;  Surgeon: Charlie FORBES Fell, MD;  Location: [facility] OR[facility];  Service: General;  Laterality: N/A;   MINOR HEMORRHOIDECTOMY  1989   TEE WITHOUT CARDIOVERSION N/A 11/20/2021   Procedure: TRANSESOPHAGEAL ECHOCARDIOGRAM (TEE);  Surgeon: Hester Wolm PARAS, MD;  Location: [facility] ORS;  Service: Cardiovascular;  Laterality: N/A;  8:00am   Social History   Socioeconomic History   Marital status: Legally Separated    Spouse name: Not on file   Number of children: Not on file   Years of education: Not on file   Highest education level: Not on file  Occupational History   Not on file  Tobacco Use   Smoking status: Former    Current packs/day: 0.00    Average packs/day: 0.5 packs/day for 38.0 years (19.0 ttl pk-yrs)    Types: Cigarettes    Start date: 04/10/1984    Quit date: 04/10/2022    Years since quitting: 1.7   Smokeless tobacco: Former  Building Services Engineer status: Never Used  Substance and Sexual Activity   Alcohol use: Not Currently    Comment: several months,[place]   Drug use: No   Sexual activity: Not Currently  Other Topics Concern   Not on file  Social History Narrative   Not on file   Social Drivers of Health   Financial Resource Strain: Low Risk  (05/07/2023)   Received from [facility]   Overall Financial Resource Strain (CARDIA)    Difficulty of Paying Living Expenses: Not hard at all  Food Insecurity: No Food Insecurity (01/07/2024)   Hunger Vital Sign     Worried About Running Out of Food in the Last Year: Never true    Ran Out of Food in the Last Year: Never true  Transportation Needs: No Transportation Needs (01/07/2024)   PRAPARE - Administrator, Civil Service (Medical): No    Lack of Transportation (Non-Medical): No  Physical Activity: Not on file  Stress: Not on file  Social Connections: Socially Isolated (01/07/2024)   Social Connection and Isolation Panel    Frequency of Communication with Friends and Family: More than three times a week    Frequency of Social Gatherings with Friends and Family: Twice a week    Attends Religious Services: Never    Database Administrator or Organizations: No    Attends Engineer, Structural: Never    Marital Status: Divorced   Family History  Problem Relation Age of Onset   Hypertension Father    Allergies  Allergen Reactions   Adalat [Nifedipine]     Foot swelling.    Amlodipine  Other (See Comments)    Leg swelling   Prior to Admission medications   Medication Sig Start Date End Date Taking? Authorizing Provider  furosemide  (LASIX ) 20 MG tablet Take 20 mg by mouth daily. 12/14/23  Yes [provider]  HYDROcodone -acetaminophen  (NORCO/VICODIN) 5-325 MG tablet Take 1 tablet by mouth every 4 (four) hours as needed for moderate pain (pain score 4-6) or severe pain (pain score 7-10). 07/15/23  Yes Laurence Locus, DO  losartan  (COZAAR ) 100 MG tablet Take 1 tablet (100 mg total) by mouth daily. 05/13/21  Yes Karamalegos, Marsa PARAS, DO  metoprolol  succinate (TOPROL -XL) 25 MG 24 hr tablet Take 1 tablet (25 mg total) by mouth daily. 11/27/22  Yes Awanda City, MD  ondansetron  (ZOFRAN ) 4 MG tablet Take 1 tablet (4 mg total) by mouth every 8 (eight) hours as needed for nausea or vomiting. 10/01/22  Yes Therisa Bi, MD  potassium chloride  SA (KLOR-CON  M) 20 MEQ tablet Take 20 mEq by mouth daily. 08/17/23  Yes [provider]  ascorbic acid  (VITAMIN C ) 500 MG tablet Take 1  tablet (500 mg total) by mouth 2 (two) times daily. Patient not taking: Reported on 01/07/2024 07/15/23   Laurence Locus, DO  cholecalciferol  (CHOLECALCIFEROL ) 25 MCG tablet Take 1 tablet (1,000 Units total) by mouth daily. Patient not taking: Reported on 01/07/2024 07/16/23   Laurence Locus, DO  methocarbamol  (ROBAXIN ) 500 MG tablet Take 1 tablet (500 mg total) by mouth every 8 (eight) hours as needed for muscle spasms. Patient not taking: Reported on 01/07/2024 07/15/23   Laurence Locus, DO  ondansetron  (ZOFRAN ) 4 MG tablet Take 1 tablet (4 mg total) by mouth every 6 (six) hours as needed for nausea, refractory nausea / vomiting or vomiting. 07/15/23   Laurence Locus, DO  pantoprazole  (PROTONIX ) 40 MG tablet Take 1 tablet (40 mg total) by mouth 2 (two) times daily. Patient not taking: Reported on 01/07/2024 07/15/23 08/14/23  Laurence Locus, DO  prochlorperazine  (COMPAZINE ) 10 MG tablet Take 1 tablet (10 mg total) by mouth every 8 (eight) hours as needed for nausea or vomiting. Patient not taking: Reported on 01/07/2024 07/15/23   Laurence Locus, DO  thiamine  (VITAMIN B-1) 100 MG tablet Take 1 tablet (100 mg total) by mouth daily. Patient not taking: Reported on 01/07/2024 07/16/23   Laurence Locus, DO   US  Venous Img Lower Bilateral (DVT) Result Date: 01/08/2024 CLINICAL DATA:  Edema. EXAM: BILATERAL LOWER EXTREMITY VENOUS DOPPLER ULTRASOUND TECHNIQUE: Gray-scale sonography with graded compression, as well as color Doppler and duplex ultrasound were performed to evaluate the lower extremity deep venous systems from the level of the common femoral vein and including the common femoral, femoral, profunda femoral, popliteal and calf veins including the posterior tibial, peroneal and gastrocnemius veins when visible. Spectral Doppler was utilized to evaluate flow at rest and with distal augmentation maneuvers in the common femoral, femoral and popliteal veins. COMPARISON:  06/26/2021 FINDINGS: RIGHT LOWER EXTREMITY Common Femoral Vein: No  evidence of thrombus. Normal compressibility, respiratory phasicity and response to augmentation. Saphenofemoral Junction: No evidence of thrombus. Normal compressibility and flow on color Doppler imaging. Profunda Femoral Vein: No evidence of thrombus. Normal compressibility and flow on color Doppler imaging. Femoral Vein: No evidence of thrombus. Normal compressibility, respiratory phasicity and response to augmentation. Popliteal Vein: No evidence of thrombus. Normal compressibility, respiratory phasicity and response to augmentation. Calf Veins: Visualized right deep calf veins are patent without thrombus. Limited evaluation. Other Findings:  None. LEFT LOWER EXTREMITY Common Femoral Vein: No evidence of thrombus. Normal compressibility, respiratory phasicity and response to augmentation. Saphenofemoral Junction: No evidence of thrombus. Normal compressibility and flow on color Doppler imaging. Profunda  Femoral Vein: No evidence of thrombus. Normal compressibility and flow on color Doppler imaging. Femoral Vein: No evidence of thrombus. Normal compressibility, respiratory phasicity and response to augmentation. Popliteal Vein: No evidence of thrombus. Normal compressibility, respiratory phasicity and response to augmentation. Calf Veins: Limited evaluation. Other Findings:  None. IMPRESSION: No evidence of deep venous thrombosis in either lower extremity. Limited evaluation of the calf veins. Electronically Signed   By: Juliene Balder M.D.   On: 01/08/2024 16:56   US  ARTERIAL LOWER EXTREMITY DUPLEX BILATERAL Result Date: 01/08/2024 CLINICAL DATA:  Ischemia.  Right third toe is black. EXAM: BILATERAL LOWER EXTREMITY ARTERIAL DUPLEX SCAN TECHNIQUE: Gray-scale sonography as well as color Doppler and duplex ultrasound was performed to evaluate the arteries of both lower extremities including the common, superficial and profunda femoral arteries, popliteal artery and calf arteries. COMPARISON:  None Available.  FINDINGS: Right Lower Extremity ABI: Patient declined this procedure. Inflow: Right common femoral artery is patent with probable biphasic Doppler waveform. Outflow: Right profunda femoral artery is patent. Origin of the right SFA is patent. Occlusion involving the proximal and mid right SFA. Distal SFA is patent with monophasic waveform. Right popliteal artery is patent with monophasic waveform. Runoff: Right posterior tibial artery and right anterior tibial artery are patent with monophasic waveforms. Right dorsalis pedis artery is patent with monophasic waveform. Left Lower Extremity ABI: Patient declined this procedure Inflow: Left common femoral artery is patent with a monophasic waveform. Outflow: Left profunda femoral artery is patent. Plaque in the left SFA. Left SFA is patent with monophasic waveform. Peak systolic velocity in the mid left SFA is 152 cm/sec. Peak systolic velocity in the distal left SFA is 227 cm/sec. Findings raise concern for some stenosis in the distal left SFA. Left popliteal artery is patent with monophasic waveform. Runoff: Left posterior tibial artery and left anterior tibial artery are patent with monophasic waveforms. Left dorsalis pedis artery is patent with monophasic waveform. IMPRESSION: 1. Right outflow disease. Occlusive disease involving the proximal and mid right superficial femoral artery. 2. Probable stenosis in the distal left superficial femoral artery. 3. Bilateral runoff vessels are patent. Electronically Signed   By: Juliene Balder M.D.   On: 01/08/2024 16:51   DG Chest Portable 1 View Result Date: 01/07/2024 CLINICAL DATA:  Fluid overload. EXAM: PORTABLE CHEST 1 VIEW COMPARISON:  07/11/2023 FINDINGS: Normal-sized heart. Tortuous aorta. Clear lungs with normal vascularity. Thoracic spine degenerative changes with changes of DISH. IMPRESSION: No acute abnormality.  No radiographic evidence of fluid overload. Electronically Signed   By: Elspeth Bathe M.D.   On:  01/07/2024 12:33    Positive ROS: All other systems have been reviewed and were otherwise negative with the exception of those mentioned in the HPI and as above.  12 point ROS was performed.  Physical Exam: General: Alert and oriented.  No apparent distress.  Vascular:  Left foot:Dorsalis Pedis:  absent Posterior Tibial:  absent  Right foot: Dorsalis Pedis:  absent Posterior Tibial:  absent vascular evaluation revealed monophasic waveforms.  He deferred ABIs.  Neuro: Absent protective sensation.  Derm: Gangrenous changes on the distal aspect of the right great toe and distal one half of the right third toe.  Superficial ulceration on the dorsal aspect of the right foot.  Diffuse cellulitis to the left lower extremity with a large venous ulceration to the lateral left lower leg.  Multiple superficial lacerations consistent with scratching on the anterior leg.  Ortho/MS: Diffuse bilateral lower extremity edema.  Left lateral  lower leg    Right foot   Assessment: Gangrene right distal great toe and third toe Severe peripheral vascular disease bilateral   Plan: Patient with gangrenous changes to the right forefoot and venous ulceration to the left lower leg.  He is in need of revascularization and vascular surgery has seen and evaluated.  They will discuss further next week after discussion of the arterial ultrasound is performed.  For now palliative care with strict removal of any pressure to either of the feet at this time.  Elevation as needed.  Podiatry will follow at a distance for now until revascularization has been performed and can consider digital amputation as needed.    Ashley Eva LABOR, DPM Cell [phone]   01/09/2024 10:31 AM

## 2024-01-09 NOTE — Progress Notes (Signed)
 Per Dr. Tobie ABI cancelled, patient refused and Vascular plans to have procedure this coming week for intervention.

## 2024-01-09 NOTE — Consult Note (Signed)
 Ruel Kung , MD [address], [address], [place], [place], [address] Phone: [phone] Fax: [phone]  Consultation  Referring Provider:     Dr Tobie Primary Care Physician:  Alla Amis, MD Primary Gastroenterologist:  Dr. Maryruth        Reason for Consultation:     Dysphagia  Date of Admission:  01/07/2024 Date of Consultation:  01/09/2024         HPI:   John Booker is a 73 y.o. male has a history of hypertension heart failure CKD, bilateral lower extremity edema and history of esophageal stricture that has been dilated in the past.  He has been admitted with anasarca and cellulitis of bilateral lower extremities and upper extremities.  Ischemic toe, history of alcohol abuse.  I have been contacted for issues related to dysphagiaHe has had an endoscopy on 07/14/2023 by Dr. Maryruth the esophagus was dilated to 15 mm with mild mucosal disruption abnormal motility was also noted in the esophagus the plan was for outpatient esophageal manometry to be scheduled.  Hemoglobin admission 12.7 g this morning 10.7 g.  Barium esophagogram in May 2025 showed high-grade stricture of the distal esophagus extending to the GE junction   Since last EGD+dilation no improvement in dysphagia. No coughing during meals, just feel that the food goes down and stays there. No use of narcotics  Past Medical History:  Diagnosis Date   (HFpEF) heart failure with preserved ejection fraction (HCC)    a.) TTE 04/11/2021: EF 60-65%, no RWMAs, G1DD, mild LAE, RVSF norm, asc Ao 38 mm; b.) TTE 11/18/2021: EF 60-65%, no RWMAs, mild LVH, G1DD, norm RVSH; c.) TEE 11/20/2021: EF 60-65%, no RWMAs, no LAA thrombus, triv MR, no valvular vegetation, no IAS; d.) TTE 11/24/2022: EF 60-65%, no RWMAs, mod LVH, norm RVSF   Alcohol abuse, in remission    Aortic atherosclerosis    Bacteremia due to Gram-negative bacteria (Klebsiella oxytoca)    a.) source unidentified; b.) admitted to [facility] 11/20/2021 - 12/28/2021    Bilateral lower extremity edema    BPH (benign prostatic hyperplasia)    Cholelithiasis    Chronic hiccups    CKD (chronic kidney disease), stage III (HCC)    DOE (dyspnea on exertion)    Esophageal dysmotility    GERD (gastroesophageal reflux disease)    Hemorrhoids    Hepatic cyst    History of bilateral cataract extraction 2024   History of hiatal hernia    History of MSSA bacteremia secondary to foot wounds, s/p treatment 9/13 - 12/28/21 02/18/2022   Hypertension    Mediastinal mass    Memory changes    Metabolic encephalopathy    a.) in the setting of bacteremia/sepsis   MSSA bacteremia 11/2021   a.) source was foot wounds and probable septic arthritis in the lumbar spine   Nephrolithiasis    Neuropathy    Non-traumatic rhabdomyolysis    Paraspinal mass    Renal cyst, right    Sigmoid diverticulosis     Past Surgical History:  Procedure Laterality Date   BRONCHIAL WASHINGS N/A 02/16/2023   Procedure: BRONCHIAL WASHINGS;  Surgeon: Parris Manna, MD;  Location: [facility] ORS;  Service: Thoracic;  Laterality: N/A;   CATARACT EXTRACTION W/PHACO Left 11/13/2022   Procedure: CATARACT EXTRACTION PHACO AND INTRAOCULAR LENS PLACEMENT (IOC) LEFT OMIDRIA    13.58  01:23.3;  Surgeon: Enola Feliciano Hugger, MD;  Location: [facility];  Service: Ophthalmology;  Laterality: Left;   CATARACT EXTRACTION W/PHACO Right 01/08/2023  Procedure: CATARACT EXTRACTION PHACO AND INTRAOCULAR LENS PLACEMENT (IOC) RIGHT OMIDRIA  14.23 01:16.5;  Surgeon: Enola Feliciano Hugger, MD;  Location: [facility];  Service: Ophthalmology;  Laterality: Right;   ESOPHAGOGASTRODUODENOSCOPY N/A 07/14/2023   Procedure: EGD (ESOPHAGOGASTRODUODENOSCOPY);  Surgeon: Maryruth Ole DASEN, MD;  Location: [facility] END[facility];  Service: Endoscopy;  Laterality: N/A;   ESOPHAGOGASTRODUODENOSCOPY (EGD) WITH PROPOFOL  N/A 05/26/2018   Procedure: ESOPHAGOGASTRODUODENOSCOPY (EGD) WITH PROPOFOL ;  Surgeon: Janalyn Keene NOVAK,  MD;  Location: [facility] ENDOSCOPY;  Service: Endoscopy;  Laterality: N/A;   ESOPHAGOGASTRODUODENOSCOPY (EGD) WITH PROPOFOL  N/A 12/28/2020   Procedure: ESOPHAGOGASTRODUODENOSCOPY (EGD) WITH PROPOFOL ;  Surgeon: Onita Elspeth Sharper, DO;  Location: [facility] ENDOSCOPY;  Service: Gastroenterology;  Laterality: N/A;   ESOPHAGOGASTRODUODENOSCOPY (EGD) WITH PROPOFOL  N/A 10/09/2021   Procedure: ESOPHAGOGASTRODUODENOSCOPY (EGD) WITH PROPOFOL ;  Surgeon: Therisa Bi, MD;  Location: [facility] ENDOSCOPY;  Service: Gastroenterology;  Laterality: N/A;   FLEXIBLE BRONCHOSCOPY N/A 02/16/2023   Procedure: FLEXIBLE BRONCHOSCOPY;  Surgeon: Parris Manna, MD;  Location: [facility];  Service: Thoracic;  Laterality: N/A;   INCISION AND DRAINAGE PERIRECTAL ABSCESS N/A 11/19/2015   Procedure: IRRIGATION AND DEBRIDEMENT PERIRECTAL ABSCESS and debridement of scrotal abscess;  Surgeon: Charlie FORBES Fell, MD;  Location: [facility] OR[facility];  Service: General;  Laterality: N/A;   INCISION AND DRAINAGE PERIRECTAL ABSCESS N/A 11/20/2015   Procedure: IRRIGATION AND DEBRIDEMENT PERIRECTAL ABSCESS / WITH DRESSING CHANGE;  Surgeon: Charlie FORBES Fell, MD;  Location: [facility];  Service: General;  Laterality: N/A;  peri rectal site   INCISION AND DRAINAGE PERIRECTAL ABSCESS N/A 11/21/2015   Procedure: IRRIGATION AND DEBRIDEMENT PERIRECTAL ABSCESS WITH DRESSING CHANGE;  Surgeon: Charlie FORBES Fell, MD;  Location: [facility] OR[facility];  Service: General;  Laterality: N/A;   MINOR HEMORRHOIDECTOMY  1989   TEE WITHOUT CARDIOVERSION N/A 11/20/2021   Procedure: TRANSESOPHAGEAL ECHOCARDIOGRAM (TEE);  Surgeon: Hester Wolm PARAS, MD;  Location: [facility] ORS;  Service: Cardiovascular;  Laterality: N/A;  8:00am    Prior to Admission medications   Medication Sig Start Date End Date Taking? Authorizing Provider  furosemide  (LASIX ) 20 MG tablet Take 20 mg by mouth daily. 12/14/23  Yes [provider]  HYDROcodone -acetaminophen  (NORCO/VICODIN) 5-325 MG tablet Take 1 tablet by mouth every 4  (four) hours as needed for moderate pain (pain score 4-6) or severe pain (pain score 7-10). 07/15/23  Yes Laurence Locus, DO  losartan  (COZAAR ) 100 MG tablet Take 1 tablet (100 mg total) by mouth daily. 05/13/21  Yes Karamalegos, Marsa PARAS, DO  metoprolol  succinate (TOPROL -XL) 25 MG 24 hr tablet Take 1 tablet (25 mg total) by mouth daily. 11/27/22  Yes Awanda City, MD  ondansetron  (ZOFRAN ) 4 MG tablet Take 1 tablet (4 mg total) by mouth every 8 (eight) hours as needed for nausea or vomiting. 10/01/22  Yes Therisa Bi, MD  potassium chloride  SA (KLOR-CON  M) 20 MEQ tablet Take 20 mEq by mouth daily. 08/17/23  Yes [provider]  ascorbic acid  (VITAMIN C ) 500 MG tablet Take 1 tablet (500 mg total) by mouth 2 (two) times daily. Patient not taking: Reported on 01/07/2024 07/15/23   Laurence Locus, DO  cholecalciferol  (CHOLECALCIFEROL ) 25 MCG tablet Take 1 tablet (1,000 Units total) by mouth daily. Patient not taking: Reported on 01/07/2024 07/16/23   Laurence Locus, DO  methocarbamol  (ROBAXIN ) 500 MG tablet Take 1 tablet (500 mg total) by mouth every 8 (eight) hours as needed for muscle spasms. Patient not taking: Reported on 01/07/2024 07/15/23   Laurence Locus, DO  ondansetron  (ZOFRAN ) 4 MG tablet Take 1 tablet (4  mg total) by mouth every 6 (six) hours as needed for nausea, refractory nausea / vomiting or vomiting. 07/15/23   Laurence Locus, DO  pantoprazole  (PROTONIX ) 40 MG tablet Take 1 tablet (40 mg total) by mouth 2 (two) times daily. Patient not taking: Reported on 01/07/2024 07/15/23 08/14/23  Laurence Locus, DO  prochlorperazine  (COMPAZINE ) 10 MG tablet Take 1 tablet (10 mg total) by mouth every 8 (eight) hours as needed for nausea or vomiting. Patient not taking: Reported on 01/07/2024 07/15/23   Laurence Locus, DO  thiamine  (VITAMIN B-1) 100 MG tablet Take 1 tablet (100 mg total) by mouth daily. Patient not taking: Reported on 01/07/2024 07/16/23   Laurence Locus, DO    Family History  Problem Relation Age of Onset   Hypertension  Father      Social History   Tobacco Use   Smoking status: Former    Current packs/day: 0.00    Average packs/day: 0.5 packs/day for 38.0 years (19.0 ttl pk-yrs)    Types: Cigarettes    Start date: 04/10/1984    Quit date: 04/10/2022    Years since quitting: 1.7   Smokeless tobacco: Former  Building Services Engineer status: Never Used  Substance Use Topics   Alcohol use: Not Currently    Comment: several months,[place]   Drug use: No    Allergies as of 01/07/2024 - Review Complete 01/07/2024  Allergen Reaction Noted   Adalat [nifedipine]  12/05/2015   Amlodipine  Other (See Comments) 07/14/2017    Review of Systems:    All systems reviewed and negative except where noted in HPI.   Physical Exam:  Vital signs in last 24 hours: Temp:  [97.7 F (36.5 C)-98.3 F (36.8 C)] 97.7 F (36.5 C) (11/01 0844) Pulse Rate:  [87-149] 94 (11/01 0844) Resp:  [12-25] 22 (11/01 0844) BP: (100-134)/(62-104) 114/74 (11/01 0844) SpO2:  [97 %-100 %] 100 % (11/01 0844) Last BM Date : 01/08/24 General:   Pleasant, cooperative in NAD Head:  Normocephalic and atraumatic. Eyes:   No icterus.   Conjunctiva pink. PERRLA. Ears:  Normal auditory acuity. Neck:  Supple; no masses or thyroidomegaly Neurologic:  Alert and oriented x3;  grossly normal neurologically. Skin:  Intact without significant lesions or rashes. Cervical Nodes:  No significant cervical adenopathy. Psych:  Alert and cooperative. Normal affect.  LAB RESULTS: Recent Labs    01/07/24 1043 01/08/24 0405  WBC 8.8 6.5  HGB 12.3* 10.7*  HCT 38.4* 32.8*  PLT 308 230   BMET Recent Labs    01/07/24 1043 01/08/24 0405 01/09/24 0611  NA 134* 133* 134*  K 4.5 4.0 3.3*  CL 103 102 97*  CO2 20* 23 27  GLUCOSE 93 87 125*  BUN 19 22 23   CREATININE 1.29* 1.54* 1.58*  CALCIUM 8.4* 8.0* 8.4*   LFT Recent Labs    01/07/24 1043  PROT 5.7*  ALBUMIN 2.5*  AST 22  ALT 16  ALKPHOS 79  BILITOT 1.0   PT/INR No results for  input(s): LABPROT, INR in the last 72 hours.  STUDIES: US  Venous Img Lower Bilateral (DVT) Result Date: 01/08/2024 CLINICAL DATA:  Edema. EXAM: BILATERAL LOWER EXTREMITY VENOUS DOPPLER ULTRASOUND TECHNIQUE: Gray-scale sonography with graded compression, as well as color Doppler and duplex ultrasound were performed to evaluate the lower extremity deep venous systems from the level of the common femoral vein and including the common femoral, femoral, profunda femoral, popliteal and calf veins including the posterior tibial, peroneal and gastrocnemius veins when visible.  Spectral Doppler was utilized to evaluate flow at rest and with distal augmentation maneuvers in the common femoral, femoral and popliteal veins. COMPARISON:  06/26/2021 FINDINGS: RIGHT LOWER EXTREMITY Common Femoral Vein: No evidence of thrombus. Normal compressibility, respiratory phasicity and response to augmentation. Saphenofemoral Junction: No evidence of thrombus. Normal compressibility and flow on color Doppler imaging. Profunda Femoral Vein: No evidence of thrombus. Normal compressibility and flow on color Doppler imaging. Femoral Vein: No evidence of thrombus. Normal compressibility, respiratory phasicity and response to augmentation. Popliteal Vein: No evidence of thrombus. Normal compressibility, respiratory phasicity and response to augmentation. Calf Veins: Visualized right deep calf veins are patent without thrombus. Limited evaluation. Other Findings:  None. LEFT LOWER EXTREMITY Common Femoral Vein: No evidence of thrombus. Normal compressibility, respiratory phasicity and response to augmentation. Saphenofemoral Junction: No evidence of thrombus. Normal compressibility and flow on color Doppler imaging. Profunda Femoral Vein: No evidence of thrombus. Normal compressibility and flow on color Doppler imaging. Femoral Vein: No evidence of thrombus. Normal compressibility, respiratory phasicity and response to augmentation.  Popliteal Vein: No evidence of thrombus. Normal compressibility, respiratory phasicity and response to augmentation. Calf Veins: Limited evaluation. Other Findings:  None. IMPRESSION: No evidence of deep venous thrombosis in either lower extremity. Limited evaluation of the calf veins. Electronically Signed   By: Juliene Balder M.D.   On: 01/08/2024 16:56   US  ARTERIAL LOWER EXTREMITY DUPLEX BILATERAL Result Date: 01/08/2024 CLINICAL DATA:  Ischemia.  Right third toe is black. EXAM: BILATERAL LOWER EXTREMITY ARTERIAL DUPLEX SCAN TECHNIQUE: Gray-scale sonography as well as color Doppler and duplex ultrasound was performed to evaluate the arteries of both lower extremities including the common, superficial and profunda femoral arteries, popliteal artery and calf arteries. COMPARISON:  None Available. FINDINGS: Right Lower Extremity ABI: Patient declined this procedure. Inflow: Right common femoral artery is patent with probable biphasic Doppler waveform. Outflow: Right profunda femoral artery is patent. Origin of the right SFA is patent. Occlusion involving the proximal and mid right SFA. Distal SFA is patent with monophasic waveform. Right popliteal artery is patent with monophasic waveform. Runoff: Right posterior tibial artery and right anterior tibial artery are patent with monophasic waveforms. Right dorsalis pedis artery is patent with monophasic waveform. Left Lower Extremity ABI: Patient declined this procedure Inflow: Left common femoral artery is patent with a monophasic waveform. Outflow: Left profunda femoral artery is patent. Plaque in the left SFA. Left SFA is patent with monophasic waveform. Peak systolic velocity in the mid left SFA is 152 cm/sec. Peak systolic velocity in the distal left SFA is 227 cm/sec. Findings raise concern for some stenosis in the distal left SFA. Left popliteal artery is patent with monophasic waveform. Runoff: Left posterior tibial artery and left anterior tibial artery are  patent with monophasic waveforms. Left dorsalis pedis artery is patent with monophasic waveform. IMPRESSION: 1. Right outflow disease. Occlusive disease involving the proximal and mid right superficial femoral artery. 2. Probable stenosis in the distal left superficial femoral artery. 3. Bilateral runoff vessels are patent. Electronically Signed   By: Juliene Balder M.D.   On: 01/08/2024 16:51   DG Chest Portable 1 View Result Date: 01/07/2024 CLINICAL DATA:  Fluid overload. EXAM: PORTABLE CHEST 1 VIEW COMPARISON:  07/11/2023 FINDINGS: Normal-sized heart. Tortuous aorta. Clear lungs with normal vascularity. Thoracic spine degenerative changes with changes of DISH. IMPRESSION: No acute abnormality.  No radiographic evidence of fluid overload. Electronically Signed   By: Elspeth Bathe M.D.   On: 01/07/2024 12:33  Impression / Plan:   HAFIZ IRION is a 73 y.o. y/o male with with a prior history of AKI heart failure alcohol abuse quit many years back admitted with cellulitis of bilateral lower extremities as well as ischemic right first toe.  History of esophageal stricture that was dilated by Dr. Maryruth in May 2025 there is also a possibility of significant esophageal dysmotility.  The patient continues to have dysphagia.  Unclear at this point of time if the dysphagia is related to the motility issue or a persistent or recurrent stricture vs a condition such as achalasia.  The patient is not on any blood thinners. Since no benefit from dilation will proceed with Barium swallow. IF shows stricture then needs eGD+dilation otherwise OP esophageal manometry to rule out achalasia and esophageal dysmotility . I have discussed the plan with the daughter and patient.    Thank you for involving me in the care of this patient.      LOS: 2 days   Ruel Kung, MD  01/09/2024, 11:03 AM

## 2024-01-09 NOTE — Progress Notes (Signed)
 [facility]  Daily Progress Note   Subjective  -   He is grouchy but improved today.  He says he does not remember being asked about an arteriogram and revascularization.  No major improvement or worsenings.  Objective Vitals:   01/09/24 0605 01/09/24 0610 01/09/24 0630 01/09/24 0844  BP:    114/74  Pulse:    94  Resp: 12 14 14  (!) 22  Temp:    97.7 F (36.5 C)  TempSrc:      SpO2:    100%  Weight:      Height:        Intake/Output Summary (Last 24 hours) at 01/09/2024 1144 Last data filed at 01/09/2024 1059 Gross per 24 hour  Intake 1075.33 ml  Output 600 ml  Net 475.33 ml    PULM  CTAB CV  RRR VASC  Palpable femoral pulses.  Normal left DP and PT signals.  Strong monophasic right PT signal with no DP signal.  Planter necrosis of tip pf right great toe and entire distal 3rd toe on right.  Bilateral weeping edema to knees, symmetric but wrinkles appearing to suggest improvement.  Normal above knees.  Laboratory CBC    Component Value Date/Time   WBC 6.5 01/08/2024 0405   HGB 10.7 (L) 01/08/2024 0405   HGB 15.7 04/15/2012 2049   HCT 32.8 (L) 01/08/2024 0405   HCT 47.7 04/15/2012 2049   PLT 230 01/08/2024 0405   PLT 175 04/15/2012 2049    BMET    Component Value Date/Time   NA 134 (L) 01/09/2024 0611   NA 135 (L) 04/15/2012 2049   K 3.3 (L) 01/09/2024 0611   K 3.5 04/15/2012 2049   CL 97 (L) 01/09/2024 0611   CL 98 04/15/2012 2049   CO2 27 01/09/2024 0611   CO2 30 04/15/2012 2049   GLUCOSE 125 (H) 01/09/2024 0611   GLUCOSE 107 (H) 04/15/2012 2049   BUN 23 01/09/2024 0611   BUN 17 04/15/2012 2049   CREATININE 1.58 (H) 01/09/2024 0611   CREATININE 1.15 12/20/2020 0946   CALCIUM 8.4 (L) 01/09/2024 0611   CALCIUM 8.5 04/15/2012 2049   GFRNONAA 46 (L) 01/09/2024 0611   GFRNONAA 73 12/20/2019 0916   GFRAA 85 12/20/2019 0916    Assessment/Planning: HD #3 s/p admission for chronic bilateral leg and foot edema and pain with more  recent onset of right 1st and 3rd toe necrosis.  He has a right SFA occlusion on Duplex.  No DVT.  Plan is for a right leg arteriogram and revascularization for right foot wound healing optimization.  I think his 3rd toe is too far gone and will need amputation.  He says he is agreeable.  I discussed with Dr. Tobie and his Nurse and will let Dr. Marea know to put him on for next week.   John Booker  01/09/2024, 11:44 AM

## 2024-01-09 NOTE — Progress Notes (Signed)
 [facility]  - [place] [facility]   PATIENT NAME: John Booker    MR#:  [id number]  DATE OF BIRTH:  Mar 21, 1950  SUBJECTIVE:  Patient not too keen on discussing his issues. He gets irritable when asked questions. He has been refusing to do imaging studies. Came in with increasing swelling of both lower extremities, weeping edema and lower extremity right third and fifth digit gangrene along with ulcers on the TBL [place] and ankle  Coughing on eating regular food   VITALS:  Blood pressure 120/63, pulse 96, temperature 98.5 F (36.9 C), temperature source Oral, resp. rate 19, height 6' 3 (1.905 m), weight 83.9 kg, SpO2 100%.  PHYSICAL EXAMINATION:   GENERAL:  73 y.o.-year-old patient with no acute distress.  LUNGS: Normal breath sounds bilaterally, no wheezing CARDIOVASCULAR: S1, S2 normal. No murmur   ABDOMEN: Soft, nontender, nondistended. Bowel sounds present.  EXTREMITIES:  NEUROLOGIC: nonfocal  patient is alert and awake   LABORATORY PANEL:  CBC Recent Labs  Lab 01/08/24 0405  WBC 6.5  HGB 10.7*  HCT 32.8*  PLT 230    Chemistries  Recent Labs  Lab 01/07/24 1043 01/08/24 0405 01/09/24 0611  NA 134* 133* 134*  K 4.5 4.0 3.3*  CL 103 102 97*  CO2 20* 23 27  GLUCOSE 93 87 125*  BUN 19 22 23   CREATININE 1.29* 1.54* 1.58*  CALCIUM 8.4* 8.0* 8.4*  MG 1.6* 2.1  --   AST 22  --   --   ALT 16  --   --   ALKPHOS 79  --   --   BILITOT 1.0  --   --    Cardiac Enzymes No results for input(s): TROPONINI in the last 168 hours. RADIOLOGY:  US  Venous Img Lower Bilateral (DVT) Result Date: 01/08/2024 CLINICAL DATA:  Edema. EXAM: BILATERAL LOWER EXTREMITY VENOUS DOPPLER ULTRASOUND TECHNIQUE: Gray-scale sonography with graded compression, as well as color Doppler and duplex ultrasound were performed to evaluate the lower extremity deep venous systems from the level of the common femoral vein and including the common femoral, femoral, profunda  femoral, popliteal and calf veins including the posterior tibial, peroneal and gastrocnemius veins when visible. Spectral Doppler was utilized to evaluate flow at rest and with distal augmentation maneuvers in the common femoral, femoral and popliteal veins. COMPARISON:  06/26/2021 FINDINGS: RIGHT LOWER EXTREMITY Common Femoral Vein: No evidence of thrombus. Normal compressibility, respiratory phasicity and response to augmentation. Saphenofemoral Junction: No evidence of thrombus. Normal compressibility and flow on color Doppler imaging. Profunda Femoral Vein: No evidence of thrombus. Normal compressibility and flow on color Doppler imaging. Femoral Vein: No evidence of thrombus. Normal compressibility, respiratory phasicity and response to augmentation. Popliteal Vein: No evidence of thrombus. Normal compressibility, respiratory phasicity and response to augmentation. Calf Veins: Visualized right deep calf veins are patent without thrombus. Limited evaluation. Other Findings:  None. LEFT LOWER EXTREMITY Common Femoral Vein: No evidence of thrombus. Normal compressibility, respiratory phasicity and response to augmentation. Saphenofemoral Junction: No evidence of thrombus. Normal compressibility and flow on color Doppler imaging. Profunda Femoral Vein: No evidence of thrombus. Normal compressibility and flow on color Doppler imaging. Femoral Vein: No evidence of thrombus. Normal compressibility, respiratory phasicity and response to augmentation. Popliteal Vein: No evidence of thrombus. Normal compressibility, respiratory phasicity and response to augmentation. Calf Veins: Limited evaluation. Other Findings:  None. IMPRESSION: No evidence of deep venous thrombosis in either lower extremity. Limited evaluation of the calf veins. Electronically Signed  By: Juliene Balder M.D.   On: 01/08/2024 16:56   US  ARTERIAL LOWER EXTREMITY DUPLEX BILATERAL Result Date: 01/08/2024 CLINICAL DATA:  Ischemia.  Right third toe is  black. EXAM: BILATERAL LOWER EXTREMITY ARTERIAL DUPLEX SCAN TECHNIQUE: Gray-scale sonography as well as color Doppler and duplex ultrasound was performed to evaluate the arteries of both lower extremities including the common, superficial and profunda femoral arteries, popliteal artery and calf arteries. COMPARISON:  None Available. FINDINGS: Right Lower Extremity ABI: Patient declined this procedure. Inflow: Right common femoral artery is patent with probable biphasic Doppler waveform. Outflow: Right profunda femoral artery is patent. Origin of the right SFA is patent. Occlusion involving the proximal and mid right SFA. Distal SFA is patent with monophasic waveform. Right popliteal artery is patent with monophasic waveform. Runoff: Right posterior tibial artery and right anterior tibial artery are patent with monophasic waveforms. Right dorsalis pedis artery is patent with monophasic waveform. Left Lower Extremity ABI: Patient declined this procedure Inflow: Left common femoral artery is patent with a monophasic waveform. Outflow: Left profunda femoral artery is patent. Plaque in the left SFA. Left SFA is patent with monophasic waveform. Peak systolic velocity in the mid left SFA is 152 cm/sec. Peak systolic velocity in the distal left SFA is 227 cm/sec. Findings raise concern for some stenosis in the distal left SFA. Left popliteal artery is patent with monophasic waveform. Runoff: Left posterior tibial artery and left anterior tibial artery are patent with monophasic waveforms. Left dorsalis pedis artery is patent with monophasic waveform. IMPRESSION: 1. Right outflow disease. Occlusive disease involving the proximal and mid right superficial femoral artery. 2. Probable stenosis in the distal left superficial femoral artery. 3. Bilateral runoff vessels are patent. Electronically Signed   By: Juliene Balder M.D.   On: 01/08/2024 16:51    Assessment and Plan  COULTON SCHLINK is a 73 y.o. male with Past medical  history of HTN, HFpEF, CKD stage II, lymphedema of bilateral lower extremities, esophageal stricture s/p dilatation, chronic hiccups as reviewed from EMR, presented at [facility] ED with complaining of bilateral lower extremity and upper extremity edema which is gradually getting worse.  Patient is not a very good historian, as per patient's daughter lower extremity edema is gradually getting worse for the past 1 week and it is weeping.  There is an area of erythema in the left lower extremity on the lateral side which he scratched maybe had trauma.   # Anasarca\Hypoalbuminemia BNP 106.9 slightly elevated --Started Lasix  IV infusion  --Monitor renal functions and urine output   # Cellulitis of bilateral lower extremities and upper extremities? Clinically looks erythematous and tender WBC within normal range Empirically started antibiotics ceftriaxone  and vancomycin  Pharmacy consulted for Vanco dosing and trough monitoring De-escalate as per improvement --Vascular plans angiogram on Monday  # Ischemic right first toe ulcer and ischemia of third toe possible underlying PAD Follow-up arterial duplex and ABI Follow-up with vascular surgery, patient may need angiogram Follow-up podiatry   # EtOH abuse As per patient he quit several years ago. Patient's daughter is concerned that he is also drinking alcohol but she is not aware how much.  Patient lives alone. Follow CIWA protocol, monitor for withdrawal symptoms Use Ativan  as needed Started multivitamin, thiamine  and folic acid . Check EtOH level and UDS   # AKI on CKD stage II Baseline sCr 0.81, eGFR >60 Scr 1.29 on admission Monitor renal functions and urine output as patient is on diuretics Check bladder scan to rule out  urinary retention Consider nephrology consult if renal functions get worse.   # Hypomagnesemia, mag repleted Monitor electrolytes and replete as needed.   # Iron deficiency, started oral iron supplement with vitamin C     # HTN, HFpEF Resumed losartan  and metoprolol  home dose Monitor BP and titrate medications accordingly Prior echo: LVEF 60 to 65%, moderate LVH Follow repeat TTE   # History of esophageal stricture and dilatation, chronic hiccups No active issues. Continue PPI twice daily Thorazine  12.5 mg IV x 1 dose given for hiccups --d/w GI dr Anna--pt does not feel EGD with dilatation helped before.  --changed to pureed diet     Nutrition: Cardiac diet DVT Prophylaxis: Subcutaneous Lovenox     Procedures: Family communication :dter Consults : podiatry, vascular surgery CODE STATUS: DNR DVT Prophylaxis : Lovenox  Level of care: Progressive Status is: Inpatient Remains inpatient appropriate because: bilateral lower extremity edema, cellulitis    TOTAL TIME TAKING CARE OF THIS PATIENT: 40 minutes.  >50% time spent on counselling and coordination of care  Note: This dictation was prepared with Dragon dictation along with smaller phrase technology. Any transcriptional errors that result from this process are unintentional.  Leita Blanch M.D    [facility]   CC: Primary care physician; Alla Amis, MD

## 2024-01-09 NOTE — Progress Notes (Signed)
  Echocardiogram 2D Echocardiogram has been performed.  John Booker Louder 01/09/2024, 2:28 PM

## 2024-01-09 NOTE — Consult Note (Signed)
 WOC Nurse Consult Note: Reason for Consult: anasarca,  however it is  noted patient has toe wounds and LE wounds  Wound type: Arterial toe tips and 3rd toe; right foot  LLE venous / mixed etiology full thickness ulceration Patient has been evaluated by podiatry, pending further vascular workup  Pressure Injury POA: NA Measurement:see nursing flow sheets Wound bed:LLE; pink, clean. RLE gangrenous (dry) changes to the 3rd toe and great toe tip Drainage (amount, consistency, odor) see nursing flow sheets Periwound: intact  Dressing procedure/placement/frequency: Cleanse LE wounds with saline, pat dry Apply silver  hydrofiber, top with foam. Change every other day Elevate legs Paint necrotic toes and toe tips with betadine, allow to air dry.     Re consult if needed, will not follow at this time. Thanks  Latina Frank [employer], RN,CWOCN, CNS, [employer] [phone]

## 2024-01-10 ENCOUNTER — Inpatient Hospital Stay

## 2024-01-10 DIAGNOSIS — R601 Generalized edema: Secondary | ICD-10-CM | POA: Diagnosis not present

## 2024-01-10 LAB — ECHOCARDIOGRAM COMPLETE
Height: 75 in
S' Lateral: 2 cm
Weight: 2960 [oz_av]

## 2024-01-10 LAB — BASIC METABOLIC PANEL WITH GFR
Anion gap: 9 (ref 5–15)
Anion gap: 11 (ref 5–15)
BUN: 27 mg/dL — ABNORMAL HIGH (ref 8–23)
BUN: 26 mg/dL — ABNORMAL HIGH (ref 8–23)
CO2: 30 mmol/L (ref 22–32)
CO2: 33 mmol/L — ABNORMAL HIGH (ref 22–32)
Calcium: 8.3 mg/dL — ABNORMAL LOW (ref 8.9–10.3)
Calcium: 8.2 mg/dL — ABNORMAL LOW (ref 8.9–10.3)
Chloride: 95 mmol/L — ABNORMAL LOW (ref 98–111)
Chloride: 92 mmol/L — ABNORMAL LOW (ref 98–111)
Creatinine, Ser: 1.35 mg/dL — ABNORMAL HIGH (ref 0.61–1.24)
Creatinine, Ser: 1.34 mg/dL — ABNORMAL HIGH (ref 0.61–1.24)
GFR, Estimated: 55 mL/min — ABNORMAL LOW (ref >60)
GFR, Estimated: 56 mL/min — ABNORMAL LOW (ref >60)
Glucose, Bld: 121 mg/dL — ABNORMAL HIGH (ref 70–99)
Glucose, Bld: 170 mg/dL — ABNORMAL HIGH (ref 70–99)
Potassium: 2.9 mmol/L — ABNORMAL LOW (ref 3.5–5.1)
Potassium: 4 mmol/L (ref 3.5–5.1)
Sodium: 134 mmol/L — ABNORMAL LOW (ref 135–145)
Sodium: 136 mmol/L (ref 135–145)

## 2024-01-10 LAB — VANCOMYCIN, RANDOM: Vancomycin Rm: 14 ug/mL

## 2024-01-10 LAB — MAGNESIUM: Magnesium: 1.8 mg/dL (ref 1.7–2.4)

## 2024-01-10 LAB — GLUCOSE, CAPILLARY: Glucose-Capillary: 143 mg/dL — ABNORMAL HIGH (ref 70–99)

## 2024-01-10 MED ORDER — DILTIAZEM HCL-DEXTROSE 125-5 MG/125ML-% IV SOLN (PREMIX)
5.0000 mg/h | INTRAVENOUS | Status: DC
  Administered 2024-01-10: 5 mg/h via INTRAVENOUS
  Filled 2024-01-10: qty 125

## 2024-01-10 MED ORDER — POTASSIUM CHLORIDE CRYS ER 20 MEQ PO TBCR
40.0000 meq | EXTENDED_RELEASE_TABLET | Freq: Every day | ORAL | Status: DC
  Administered 2024-01-10: 40 meq via ORAL
  Filled 2024-01-10: qty 2

## 2024-01-10 MED ORDER — METOPROLOL TARTRATE 5 MG/5ML IV SOLN
5.0000 mg | Freq: Once | INTRAVENOUS | Status: AC
  Administered 2024-01-10: 5 mg via INTRAVENOUS

## 2024-01-10 MED ORDER — VITAMIN D 25 MCG (1000 UNIT) PO TABS
2000.0000 [IU] | ORAL_TABLET | Freq: Every day | ORAL | Status: DC
  Administered 2024-01-10 – 2024-01-14 (×3): 2000 [IU] via ORAL
  Filled 2024-01-10 (×4): qty 2

## 2024-01-10 MED ORDER — VANCOMYCIN HCL 1250 MG/250ML IV SOLN
1250.0000 mg | Freq: Once | INTRAVENOUS | Status: AC
  Administered 2024-01-10: 1250 mg via INTRAVENOUS
  Filled 2024-01-10: qty 250

## 2024-01-10 NOTE — Progress Notes (Signed)
 Rapid response was called for tachycardia HR 130's gave Metoprolol  5 mg x2--remains in tachycardia with HR 120's. BP stable Pt asypmtomatic. Wants to eat. He understands he needs to be NPO Will start low dose cardizem  gtt

## 2024-01-10 NOTE — H&P (View-Only) (Signed)
 John Booker  Daily Progress Note   Subjective  -   OK with plan for angio and intervention tomorrow.  He still complains about swallowing.  I know a lot more after reading his chart and explained things to him.  Objective Vitals:   01/09/24 1933 01/09/24 2322 01/10/24 0510 01/10/24 1108  BP: (!) 140/75 (!) 144/72 135/82 131/80  Pulse: 96 95 94 87  Resp: 17 17 16 19   Temp: 98.4 F (36.9 C) 97.7 F (36.5 C) 98 F (36.7 C) 97.9 F (36.6 C)  TempSrc:    Oral  SpO2: 100% 97%  98%  Weight:      Height:        Intake/Output Summary (Last 24 hours) at 01/10/2024 1618 Last data filed at 01/10/2024 1327 Gross per 24 hour  Intake 228.1 ml  Output 2500 ml  Net -2271.9 ml    PULM  CTAB CV  RRR VASC  unchanged  Laboratory CBC    Component Value Date/Time   WBC 6.5 01/08/2024 0405   HGB 10.7 (L) 01/08/2024 0405   HGB 15.7 04/15/2012 2049   HCT 32.8 (L) 01/08/2024 0405   HCT 47.7 04/15/2012 2049   PLT 230 01/08/2024 0405   PLT 175 04/15/2012 2049    BMET    Component Value Date/Time   NA 134 (L) 01/10/2024 0505   NA 135 (L) 04/15/2012 2049   K 2.9 (L) 01/10/2024 0505   K 3.5 04/15/2012 2049   CL 95 (L) 01/10/2024 0505   CL 98 04/15/2012 2049   CO2 30 01/10/2024 0505   CO2 30 04/15/2012 2049   GLUCOSE 121 (H) 01/10/2024 0505   GLUCOSE 107 (H) 04/15/2012 2049   BUN 27 (H) 01/10/2024 0505   BUN 17 04/15/2012 2049   CREATININE 1.35 (H) 01/10/2024 0505   CREATININE 1.15 12/20/2020 0946   CALCIUM 8.3 (L) 01/10/2024 0505   CALCIUM 8.5 04/15/2012 2049   GFRNONAA 55 (L) 01/10/2024 0505   GFRNONAA 73 12/20/2019 0916   GFRAA 85 12/20/2019 0916    Assessment/Planning: HD #4 s/p admission for right toe gangrene and bilateral LE and upper extremity edema.  He has a known and severe esophageal stricture.  His albumin is 2.5.  He has severe malnutrition and his edema is nutritional primarily.  It is a many month to year background.  On top of that he  has developed a right SFA occlusion and based on the Doppler findings yesterday (my exam, hand-held), I believe he has a likely new right ATA/DPA occlusion which put his swollen toes in jeopardy. Plan is arteriogram tomorrow with hopeful revascularization of the right foot.  I believe he will at least lose the right 3rd toe.  Next he needs his esophageal stenosis and malnutrition addressed.  He seems to understand and is agreeable to getting things fixed.  He is NPO tonight for the angio procedure tomorrow.  John Booker  01/10/2024, 4:18 PM

## 2024-01-10 NOTE — Progress Notes (Signed)
 [facility]  Daily Progress Note   Subjective  -   OK with plan for angio and intervention tomorrow.  He still complains about swallowing.  I know a lot more after reading his chart and explained things to him.  Objective Vitals:   01/09/24 1933 01/09/24 2322 01/10/24 0510 01/10/24 1108  BP: (!) 140/75 (!) 144/72 135/82 131/80  Pulse: 96 95 94 87  Resp: 17 17 16 19   Temp: 98.4 F (36.9 C) 97.7 F (36.5 C) 98 F (36.7 C) 97.9 F (36.6 C)  TempSrc:    Oral  SpO2: 100% 97%  98%  Weight:      Height:        Intake/Output Summary (Last 24 hours) at 01/10/2024 1618 Last data filed at 01/10/2024 1327 Gross per 24 hour  Intake 228.1 ml  Output 2500 ml  Net -2271.9 ml    PULM  CTAB CV  RRR VASC  unchanged  Laboratory CBC    Component Value Date/Time   WBC 6.5 01/08/2024 0405   HGB 10.7 (L) 01/08/2024 0405   HGB 15.7 04/15/2012 2049   HCT 32.8 (L) 01/08/2024 0405   HCT 47.7 04/15/2012 2049   PLT 230 01/08/2024 0405   PLT 175 04/15/2012 2049    BMET    Component Value Date/Time   NA 134 (L) 01/10/2024 0505   NA 135 (L) 04/15/2012 2049   K 2.9 (L) 01/10/2024 0505   K 3.5 04/15/2012 2049   CL 95 (L) 01/10/2024 0505   CL 98 04/15/2012 2049   CO2 30 01/10/2024 0505   CO2 30 04/15/2012 2049   GLUCOSE 121 (H) 01/10/2024 0505   GLUCOSE 107 (H) 04/15/2012 2049   BUN 27 (H) 01/10/2024 0505   BUN 17 04/15/2012 2049   CREATININE 1.35 (H) 01/10/2024 0505   CREATININE 1.15 12/20/2020 0946   CALCIUM 8.3 (L) 01/10/2024 0505   CALCIUM 8.5 04/15/2012 2049   GFRNONAA 55 (L) 01/10/2024 0505   GFRNONAA 73 12/20/2019 0916   GFRAA 85 12/20/2019 0916    Assessment/Planning: HD #4 s/p admission for right toe gangrene and bilateral LE and upper extremity edema.  He has a known and severe esophageal stricture.  His albumin is 2.5.  He has severe malnutrition and his edema is nutritional primarily.  It is a many month to year background.  On top of that he  has developed a right SFA occlusion and based on the Doppler findings yesterday (my exam, hand-held), I believe he has a likely new right ATA/DPA occlusion which put his swollen toes in jeopardy. Plan is arteriogram tomorrow with hopeful revascularization of the right foot.  I believe he will at least lose the right 3rd toe.  Next he needs his esophageal stenosis and malnutrition addressed.  He seems to understand and is agreeable to getting things fixed.  He is NPO tonight for the angio procedure tomorrow.  [name]  01/10/2024, 4:18 PM

## 2024-01-10 NOTE — Consult Note (Signed)
 Pharmacy Antibiotic Note  John Booker is a 73 y.o. male admitted on 01/07/2024 with lymphedema with increased weeping and concern for cellulitis.  Pharmacy has been consulted for vancomycin  dosing.  Scr 1.29 (elevated from baseline of 0.8-1.0) > 1.54 today  Plan: Pt was in an AKI and is clearing vancomycin  slowly. Vancomycin  level 14 after 29 hours post dose. Will give vancomycin  1250 mg x 1 (~15 mg/kg) and will get a 24 hour random level 11/3. Scr is trending down.    Height: 6' 3 (190.5 cm) Weight: 83.9 kg (185 lb) IBW/kg (Calculated) : 84.5  Temp (24hrs), Avg:98.2 F (36.8 C), Min:97.7 F (36.5 C), Max:98.5 F (36.9 C)  Recent Labs  Lab 01/07/24 1043 01/08/24 0405 01/09/24 0611 01/09/24 1351 01/10/24 0505 01/10/24 0853  WBC 8.8 6.5  --   --   --   --   CREATININE 1.29* 1.54* 1.58*  --  1.35*  --   VANCORANDOM  --   --   --  19  --  14    Estimated Creatinine Clearance: 57 mL/min (A) (by C-G formula based on SCr of 1.35 mg/dL (H)).    Allergies  Allergen Reactions   Adalat [Nifedipine]     Foot swelling.    Amlodipine  Other (See Comments)    Leg swelling    Antimicrobials this admission: vancomycin  10/30 >>  Ceftriaxone  10/30 >>   Microbiology results: 10/30 BCx: in process  Thank you for allowing pharmacy to be a part of this patient's care.  Cathaleen Blanch, PharmD, BCPS 01/10/2024 9:40 AM

## 2024-01-10 NOTE — Significant Event (Signed)
 Rapid Response Event Note   Reason for Call : called RRT for reported SVT on monitor, rate 170's   Initial Focused Assessment: sitting up in bed, conversing with staff, no c/o chest pain, no physical distress.      Interventions: Dr Tobie to bedside, stat EKG, 5mg  metoprolol , stat bmp and mag, repeat metoprolol , and start cardizem  drip...   Plan of Care: as above, RN Sarah to call for further assistance.    Event Summary: as above  MD Notified: Leita Tobie Call [phone] Arrival [phone] End Time:1700  Lennan Malone A, RN

## 2024-01-10 NOTE — Discharge Instructions (Signed)
 Outpatient Substance Use Treatment Services   [facility] [address] [place], [place] [address]  [phone] Private insurance, Medicare A&B, and [facility]   [facility])  [address],  [place], [place] [address] [phone] Medicaid, Self Pay   [facility]      [address]  [place], [place] [phone] Medicaid and [facility], Self Pay   [facility] [address]  [place], [place]  [phone] [facility], and Self Pay  [facility]      [address]    [place], [place] [address]  [phone] or [phone] Private Insurance Only                 [facility] [address]  [place], [place]  [phone] Medicaid, Medicare, Private Insurance  [facility] at [facility] [address]  [place], [place] [address] [phone] Services are free or reduced  [facility]  [address] [phone]  Self Pay only, sliding scale  [facility]  [address]  [place], [place] [address] [phone] ([employer] [facility]) Self Pay, Medicaid Only   [facility] [address], [place] [address] [phone] Medicaid, Medicare, Private Insurance    Do you feel isolated?  [facility] offers a [employer] that anyone can call toll free at [phone]. The friendship line is available 24 hours a day  [facility] is a Program of All-inclusive Care for the Elderly (PACE). Their mission is to promote and sustain the independence of seniors wishing to remain in the community. They provide seniors with comprehensive long-term health, social, medical and dietary care. Their program is a safe alternative to nursing home care.  [phone]  [facility] Physical Address [facility] [address] [place], [place] [address] Phone: [phone]. . Online zoom yoga class, connect with others without leaving your home [facility] offers [place] dance cardio sessions for individuals via Zoom. This program provides: - Dance fitness activities Please contact program for more information. Servinganyone in need adults 18+ hiv/aids individuals families Call [phone]  Email [email or web address] to get more info  [employer] offers an online [employer] to individuals where they can receive help to focus on their best health. Whether you're a [employer] member or not, the neighborhood center offers a... Main Serviceshealth education  exercise & fitness  community support services  recreation  virtual support Other Servicessupport groups Servinganyone in need adults young adults teens seniors individuals families [email or web address] to get more info  Schedule on their website  [facility] offers an array of activities for adults age 79 and over. This program provides:- Fitness and health programs- Tech classes- Activity books Main Serviceshealth education  community support services  exercise & fitness  recreation  more education Servingseniors  Call [phone]    For more resources go online to [email or web address] and type in you zipcode

## 2024-01-10 NOTE — TOC Initial Note (Signed)
 Transition of Care [facility]) - Initial/Assessment Note    Patient Details  Name: John Booker MRN: [id number] Date of Birth: August 28, 1950  Transition of Care [facility]) CM/SW Contact:    Shikha Bibb L Sapphire Tygart, LCSW Phone Number: 01/10/2024, 1:22 PM  Clinical Narrative:                  [facility] consult received for SNF Placement. CSW attempted to complete assessment with patient. He was receiving patient care.        Patient Goals and CMS Choice            Expected Discharge Plan and Services                                              Prior Living Arrangements/Services                       Activities of Daily Living   ADL Screening (condition at time of admission) Independently performs ADLs?: Yes (appropriate for developmental age) Is the patient deaf or have difficulty hearing?: No Does the patient have difficulty seeing, even when wearing glasses/contacts?: No Does the patient have difficulty concentrating, remembering, or making decisions?: No  Permission Sought/Granted                  Emotional Assessment              Admission diagnosis:  Anasarca [R60.1] Peripheral edema [R60.0] Cellulitis of left lower extremity [L03.116] Patient Active Problem List   Diagnosis Date Noted   Cellulitis of left lower extremity 01/08/2024   Anasarca 01/07/2024   Acquired achalasia of esophagus - s/p esophageal dilatation 07-14-2023 07/15/2023   DNR (do not resuscitate)/DNI(Do NOT Intubate) 07/15/2023   Hypophosphatemia 07/12/2023   Hypomagnesemia 07/12/2023   Weakness 12/13/2022   Serum total bilirubin elevated 12/13/2022   Benign paraspinal mass 11/20/2021   GERD without esophagitis 11/16/2021   Hypokalemia 11/16/2021   Hyponatremia 11/16/2021   AKI (acute kidney injury) 11/15/2021   Stage 3a chronic kidney disease (HCC) 11/07/2021   Protrusion of cervical intervertebral disc 07/23/2021   Exertional dyspnea 07/04/2021   History of alcohol abuse  07/04/2021   History of tobacco abuse 07/04/2021   Chronic diastolic CHF (congestive heart failure) (HCC) 06/26/2021   Headache 06/26/2021   Low serum albumin 05/10/2021   Malnutrition of moderate degree 12/28/2020   Esophageal dysmotility 12/28/2020   Generalized weakness 12/26/2020   Overweight (BMI 25.0-29.9) 12/20/2019   Neuropathy 04/04/2019   Pre-ulcerative calluses 10/07/2018   Skin lesion 10/07/2018   Plantar flexed metatarsal bone of right foot 10/07/2018   Hiatal hernia    Peripheral edema 05/08/2017   Alcohol abuse 03/06/2017   Alcohol dependence in sustained full remission (HCC) 03/05/2017   Essential hypertension 11/15/2015   Vitamin D  deficiency 11/15/2015   PCP:  Alla Amis, MD Pharmacy:   [facility] - [place], [place] - [address] [place] [address] Phone: [phone] Fax: [phone]  [facility] [address], [place] - [address] [place] [address] Phone: [phone] Fax: [phone]     Social Drivers of Health (SDOH) Social History: SDOH Screenings   Food Insecurity: No Food Insecurity (01/07/2024)  Housing: Low Risk  (01/07/2024)  Transportation Needs: No Transportation Needs (01/07/2024)  Utilities: Not At Risk (01/07/2024)  Depression (PHQ2-9): Low Risk  (12/12/2021)  Financial Resource Strain: Low Risk  (05/07/2023)   Received from [facility]  Social Connections: Socially Isolated (01/07/2024)  Tobacco Use: Medium Risk (01/07/2024)   SDOH Interventions:     Readmission Risk Interventions    07/13/2023   12:44 PM  Readmission Risk Prevention Plan  Transportation Screening Complete  PCP or Specialist Appt within 5-7 Days Complete  Medication Review (RN CM) Complete

## 2024-01-10 NOTE — Progress Notes (Signed)
 Bilateral lower extremity dressing changed per order, dressing saturated, wound appears to be healing. Lower extremities elevated.

## 2024-01-10 NOTE — Care Management Important Message (Signed)
 Important Message  Patient Details  Name: John Booker MRN: [id number] Date of Birth: 03-09-1951   Important Message Given:  Yes - Medicare IM     [name] 01/10/2024, 6:03 PM

## 2024-01-10 NOTE — Plan of Care (Signed)

## 2024-01-10 NOTE — Progress Notes (Signed)
 [facility]  - [place] [facility]   PATIENT NAME: John Booker    MR#:  [id number]  DATE OF BIRTH:  April 18, 1950  SUBJECTIVE:  Came in with increasing swelling of both lower extremities, weeping edema and lower extremity right third and fifth digit gangrene along with ulcers on the TBL [place] and ankle  Coughing on eating regular food--tolerating some pureed but still coughing Discussed results to Ba swallow   VITALS:  Blood pressure 131/80, pulse 87, temperature 97.9 F (36.6 C), temperature source Oral, resp. rate 19, height 6' 3 (1.905 m), weight 83.9 kg, SpO2 98%.  PHYSICAL EXAMINATION:   GENERAL:  73 y.o.-year-old patient with no acute distress.  LUNGS: Normal breath sounds bilaterally, no wheezing CARDIOVASCULAR: S1, S2 normal. No murmur   ABDOMEN: Soft, nontender, nondistended. Bowel sounds present.  EXTREMITIES:  NEUROLOGIC: nonfocal  patient is alert and awake   LABORATORY PANEL:  CBC Recent Labs  Lab 01/08/24 0405  WBC 6.5  HGB 10.7*  HCT 32.8*  PLT 230    Chemistries  Recent Labs  Lab 01/07/24 1043 01/08/24 0405 01/09/24 0611 01/10/24 0505  NA 134* 133*   < > 134*  K 4.5 4.0   < > 2.9*  CL 103 102   < > 95*  CO2 20* 23   < > 30  GLUCOSE 93 87   < > 121*  BUN 19 22   < > 27*  CREATININE 1.29* 1.54*   < > 1.35*  CALCIUM 8.4* 8.0*   < > 8.3*  MG 1.6* 2.1  --   --   AST 22  --   --   --   ALT 16  --   --   --   ALKPHOS 79  --   --   --   BILITOT 1.0  --   --   --    < > = values in this interval not displayed.   Cardiac Enzymes No results for input(s): TROPONINI in the last 168 hours. RADIOLOGY:  DG ESOPHAGUS W SINGLE CM (SOL OR THIN BA) Result Date: 01/10/2024 CLINICAL DATA:  73 year old male with a history of GERD, esophageal dysphagia, and previous barium swallow on 07/13/2023 concerning for high-grade stricture. Patient underwent EGD with dilation on 07/14/2023 with unclear benefits postprocedure. Patient continues to  report minimal improvement in dysphagia. Request for repeat barium swallow to evaluate for dysmotility versus recurrent/persistent stricture. EXAM: ESOPHAGUS/BARIUM SWALLOW/TABLET STUDY TECHNIQUE: Single contrast examination was performed using thin liquid barium. This exam was performed by [facility] PA-C, and was supervised and interpreted by Dr. Ree Molt. FLUOROSCOPY: Radiation Exposure Index (as provided by the fluoroscopic device): 18.8 mGy Kerma COMPARISON:  DG ESOPHAGUS W SINGLE CM-07/13/2023 FINDINGS: Swallowing: Limited evaluation, but no evidence of gross aspiration. Pharynx: Unremarkable. Esophagus: Limited evaluation due to single contrast only; however, no evidence of mucosal abnormalities. Redemonstration of approximately 5 cm long smooth walled marked narrowing/stricture of the lower most esophagus extending up to the GE junction. There is proximal mild-to-moderate esophageal dilation. There are also resultant multiple underlying non propulsive peristaltic waves. Esophageal motility: Dysmotility with intermittent proximal escape. Significantly delayed passage of contrast from the distal esophagus into the stomach secondary to stricture. Findings are essentially similar to the prior study no again represent benign stricture in the setting of chronic acid reflux. Correlate clinically and with upper GI endoscopy. Hiatal Hernia: None visualized. Gastroesophageal reflux: None visualized. However, evaluation is limited due to marked lower esophageal narrowing. Other: Limited  evaluation due to minimal patient mobility. Entirety of exam performed at approximately 20 degree table tilt. IMPRESSION: 1. Redemonstration of smooth walled high-grade stricture of the lower most thoracic esophagus reaching up to the GE junction. 2. Dysmotility likely secondary to stricture. Electronically Signed   By: Ree Molt M.D.   On: 01/10/2024 12:27   ECHOCARDIOGRAM COMPLETE Result Date: 01/10/2024     ECHOCARDIOGRAM REPORT   Patient Name:   John Booker Date of Exam: 01/09/2024 Medical Rec #:  [id number]      Height:       75.0 in Accession #:    [id number]     Weight:       185.0 lb Date of Birth:  01-21-1951      BSA:          2.123 m Patient Age:    72 years       BP:           129/87 mmHg Patient Gender: M              HR:           96 bpm. Exam Location:  [facility] Procedure: 2D Echo, Cardiac Doppler and Color Doppler (Both Spectral and Color            Flow Doppler were utilized during procedure). Indications:     CHF I50.31  History:         Patient has prior history of Echocardiogram examinations, most                  recent 11/24/2022.  Sonographer:     Thedora Louder RDCS, FASE Referring Phys:  [id number] ELVAN SOR Diagnosing Phys: Denyse Bathe  Sonographer Comments: Technically challenging study due to limited acoustic windows, no apical window, suboptimal parasternal window and no subcostal window. Image acquisition challenging due to respiratory motion and Image acquisition challenging due to  patient body habitus. The patient could not tolerate pressing on left side. Suboptimal apical acoustic windows. IMPRESSIONS  1. Left ventricular ejection fraction, by estimation, is 60 to 65%. The left ventricle has normal function. The left ventricle has no regional wall motion abnormalities. Left ventricular diastolic function could not be evaluated.  2. Right ventricular systolic function is normal. The right ventricular size is normal.  3. A small pericardial effusion is present. The pericardial effusion is circumferential.  4. The mitral valve is normal in structure. No evidence of mitral valve regurgitation. No evidence of mitral stenosis.  5. The aortic valve is normal in structure. Aortic valve regurgitation is not visualized. No aortic stenosis is present.  6. The inferior vena cava is normal in size with greater than 50% respiratory variability, suggesting right atrial pressure of 3 mmHg. FINDINGS  Left  Ventricle: Left ventricular ejection fraction, by estimation, is 60 to 65%. The left ventricle has normal function. The left ventricle has no regional wall motion abnormalities. Strain was performed and the global longitudinal strain is indeterminate. The left ventricular internal cavity size was normal in size. There is no left ventricular hypertrophy. Left ventricular diastolic function could not be evaluated. Right Ventricle: The right ventricular size is normal. No increase in right ventricular wall thickness. Right ventricular systolic function is normal. Left Atrium: Left atrial size was normal in size. Right Atrium: Right atrial size was normal in size. Pericardium: A small pericardial effusion is present. The pericardial effusion is circumferential. Mitral Valve: The mitral valve is normal in structure. No evidence of mitral valve  regurgitation. No evidence of mitral valve stenosis. Tricuspid Valve: The tricuspid valve is normal in structure. Tricuspid valve regurgitation is not demonstrated. No evidence of tricuspid stenosis. Aortic Valve: The aortic valve is normal in structure. Aortic valve regurgitation is not visualized. No aortic stenosis is present. Pulmonic Valve: The pulmonic valve was normal in structure. Pulmonic valve regurgitation is not visualized. No evidence of pulmonic stenosis. Aorta: The aortic root is normal in size and structure. Venous: The inferior vena cava is normal in size with greater than 50% respiratory variability, suggesting right atrial pressure of 3 mmHg. IAS/Shunts: No atrial level shunt detected by color flow Doppler. Additional Comments: 3D was performed not requiring image post processing on an independent workstation and was indeterminate.  LEFT VENTRICLE PLAX 2D LVIDd:         3.10 cm LVIDs:         2.00 cm LV PW:         1.30 cm LV IVS:        1.00 cm LVOT diam:     2.40 cm LVOT Area:     4.52 cm  LEFT ATRIUM         Index LA diam:    2.70 cm 1.27 cm/m                         PULMONIC VALVE AORTA                 PV Vmax:       1.28 m/s Ao Root diam: 3.70 cm PV Peak grad:  6.6 mmHg   SHUNTS Systemic Diam: 2.40 cm Denyse Bathe Electronically signed by Denyse Bathe Signature Date/Time: 01/10/2024/8:50:12 AM    Final    US  Venous Img Lower Bilateral (DVT) Result Date: 01/08/2024 CLINICAL DATA:  Edema. EXAM: BILATERAL LOWER EXTREMITY VENOUS DOPPLER ULTRASOUND TECHNIQUE: Gray-scale sonography with graded compression, as well as color Doppler and duplex ultrasound were performed to evaluate the lower extremity deep venous systems from the level of the common femoral vein and including the common femoral, femoral, profunda femoral, popliteal and calf veins including the posterior tibial, peroneal and gastrocnemius veins when visible. Spectral Doppler was utilized to evaluate flow at rest and with distal augmentation maneuvers in the common femoral, femoral and popliteal veins. COMPARISON:  06/26/2021 FINDINGS: RIGHT LOWER EXTREMITY Common Femoral Vein: No evidence of thrombus. Normal compressibility, respiratory phasicity and response to augmentation. Saphenofemoral Junction: No evidence of thrombus. Normal compressibility and flow on color Doppler imaging. Profunda Femoral Vein: No evidence of thrombus. Normal compressibility and flow on color Doppler imaging. Femoral Vein: No evidence of thrombus. Normal compressibility, respiratory phasicity and response to augmentation. Popliteal Vein: No evidence of thrombus. Normal compressibility, respiratory phasicity and response to augmentation. Calf Veins: Visualized right deep calf veins are patent without thrombus. Limited evaluation. Other Findings:  None. LEFT LOWER EXTREMITY Common Femoral Vein: No evidence of thrombus. Normal compressibility, respiratory phasicity and response to augmentation. Saphenofemoral Junction: No evidence of thrombus. Normal compressibility and flow on color Doppler imaging. Profunda Femoral Vein: No evidence  of thrombus. Normal compressibility and flow on color Doppler imaging. Femoral Vein: No evidence of thrombus. Normal compressibility, respiratory phasicity and response to augmentation. Popliteal Vein: No evidence of thrombus. Normal compressibility, respiratory phasicity and response to augmentation. Calf Veins: Limited evaluation. Other Findings:  None. IMPRESSION: No evidence of deep venous thrombosis in either lower extremity. Limited evaluation of the calf veins. Electronically Signed   By:  Juliene Balder M.D.   On: 01/08/2024 16:56   US  ARTERIAL LOWER EXTREMITY DUPLEX BILATERAL Result Date: 01/08/2024 CLINICAL DATA:  Ischemia.  Right third toe is black. EXAM: BILATERAL LOWER EXTREMITY ARTERIAL DUPLEX SCAN TECHNIQUE: Gray-scale sonography as well as color Doppler and duplex ultrasound was performed to evaluate the arteries of both lower extremities including the common, superficial and profunda femoral arteries, popliteal artery and calf arteries. COMPARISON:  None Available. FINDINGS: Right Lower Extremity ABI: Patient declined this procedure. Inflow: Right common femoral artery is patent with probable biphasic Doppler waveform. Outflow: Right profunda femoral artery is patent. Origin of the right SFA is patent. Occlusion involving the proximal and mid right SFA. Distal SFA is patent with monophasic waveform. Right popliteal artery is patent with monophasic waveform. Runoff: Right posterior tibial artery and right anterior tibial artery are patent with monophasic waveforms. Right dorsalis pedis artery is patent with monophasic waveform. Left Lower Extremity ABI: Patient declined this procedure Inflow: Left common femoral artery is patent with a monophasic waveform. Outflow: Left profunda femoral artery is patent. Plaque in the left SFA. Left SFA is patent with monophasic waveform. Peak systolic velocity in the mid left SFA is 152 cm/sec. Peak systolic velocity in the distal left SFA is 227 cm/sec. Findings  raise concern for some stenosis in the distal left SFA. Left popliteal artery is patent with monophasic waveform. Runoff: Left posterior tibial artery and left anterior tibial artery are patent with monophasic waveforms. Left dorsalis pedis artery is patent with monophasic waveform. IMPRESSION: 1. Right outflow disease. Occlusive disease involving the proximal and mid right superficial femoral artery. 2. Probable stenosis in the distal left superficial femoral artery. 3. Bilateral runoff vessels are patent. Electronically Signed   By: Juliene Balder M.D.   On: 01/08/2024 16:51    Assessment and Plan  DEMETRIOS BYRON is a 74 y.o. male with Past medical history of HTN, HFpEF, CKD stage II, lymphedema of bilateral lower extremities, esophageal stricture s/p dilatation, chronic hiccups as reviewed from EMR, presented at [facility] ED with complaining of bilateral lower extremity and upper extremity edema which is gradually getting worse.  Patient is not a very good historian, as per patient's daughter lower extremity edema is gradually getting worse for the past 1 week and it is weeping.  There is an area of erythema in the left lower extremity on the lateral side which he scratched maybe had trauma.   # Anasarca\Hypoalbuminemia BNP 106.9 slightly elevated --Started Lasix  IV infusion --diuresed well. D/c lasix  gtt --renal function stbale   # Cellulitis of bilateral lower extremities  # Ischemic right first toe ulcer and ischemia of third toe possible underlying PAD Clinically looks erythematous and tender WBC within normal range Empirically started antibiotics ceftriaxone  and vancomycin  Pharmacy consulted for Vanco dosing and trough monitoring De-escalate as per improvement --Vascular plans angiogram on Monday  # EtOH abuse --As per patient he quit several years ago. --Patient's daughter is concerned that he is also drinking alcohol but she is not aware how much.  -- lives alone. --no s/s of WD --Started  multivitamin, thiamine  and folic acid .   # AKI on CKD stage II Baseline sCr 0.81, eGFR >60 Scr 1.29 on admission --creat stable at 1.35 --good uop with IV lasix    # Hypomagnesemia, mag repleted   # Iron deficiency, started oral iron supplement with vitamin C    # HTN, HFpEF --on losartan  and metoprolol  home dose --Prior echo: LVEF 60 to 65%, moderate LVH --cont BB  and hydralazine  --Ace on hold due to creat and pt being on IV lasix  gtt   # History of esophageal stricture and dilatation, chronic hiccups --Continue PPI twice daily --Thorazine  12.5 mg IV x 1 dose given for hiccups --d/w GI dr Anna--pt does not feel EGD with dilatation helped before.  --Ba swallow shows HIGH grade stricture--will need EGD with dilatation     DVT Prophylaxis: Subcutaneous Lovenox     Procedures: Family communication :dter Consults : podiatry, vascular surgery,GI CODE STATUS: DNR DVT Prophylaxis : Lovenox  Level of care: Progressive Status is: Inpatient Remains inpatient appropriate because: bilateral lower extremity edema, cellulitis, severe dysphagia    TOTAL TIME TAKING CARE OF THIS PATIENT: 40 minutes.  >50% time spent on counselling and coordination of care  Note: This dictation was prepared with Dragon dictation along with smaller phrase technology. Any transcriptional errors that result from this process are unintentional.  [place] M.D    [facility]   CC: Primary care physician; Alla Amis, MD

## 2024-01-10 NOTE — Progress Notes (Signed)
   01/10/24 1630  Spiritual Encounters  Type of Visit Initial  Care provided to: Pt not available  Conversation partners present during encounter Nurse  Referral source Code page  Reason for visit Code  OnCall Visit Yes  Spiritual Care Plan  Spiritual Care Issues Still Outstanding No further spiritual care needs at this time (see row info)   Chaplain responded to rapid response and patient was unavailable.

## 2024-01-11 ENCOUNTER — Encounter: Payer: Self-pay | Admitting: Anesthesiology

## 2024-01-11 ENCOUNTER — Encounter: Payer: Self-pay | Admitting: Vascular Surgery

## 2024-01-11 ENCOUNTER — Encounter
Admission: EM | Disposition: A | Discharge status: Skilled Nursing Facility | Payer: Self-pay | Source: Home / Self Care | Attending: Internal Medicine

## 2024-01-11 DIAGNOSIS — R1319 Other dysphagia: Secondary | ICD-10-CM

## 2024-01-11 DIAGNOSIS — L97919 Non-pressure chronic ulcer of unspecified part of right lower leg with unspecified severity: Secondary | ICD-10-CM

## 2024-01-11 DIAGNOSIS — I70261 Atherosclerosis of native arteries of extremities with gangrene, right leg: Secondary | ICD-10-CM

## 2024-01-11 DIAGNOSIS — L97503 Non-pressure chronic ulcer of other part of unspecified foot with necrosis of muscle: Secondary | ICD-10-CM | POA: Diagnosis not present

## 2024-01-11 DIAGNOSIS — K222 Esophageal obstruction: Secondary | ICD-10-CM | POA: Diagnosis not present

## 2024-01-11 DIAGNOSIS — I70202 Unspecified atherosclerosis of native arteries of extremities, left leg: Secondary | ICD-10-CM | POA: Diagnosis not present

## 2024-01-11 DIAGNOSIS — R1310 Dysphagia, unspecified: Secondary | ICD-10-CM | POA: Diagnosis not present

## 2024-01-11 DIAGNOSIS — R600 Localized edema: Secondary | ICD-10-CM | POA: Diagnosis not present

## 2024-01-11 DIAGNOSIS — L03116 Cellulitis of left lower limb: Secondary | ICD-10-CM | POA: Diagnosis not present

## 2024-01-11 HISTORY — PX: LOWER EXTREMITY ANGIOGRAPHY: CATH118251

## 2024-01-11 LAB — BASIC METABOLIC PANEL WITH GFR
Anion gap: 10 (ref 5–15)
BUN: 26 mg/dL — ABNORMAL HIGH (ref 8–23)
CO2: 29 mmol/L (ref 22–32)
Calcium: 8.5 mg/dL — ABNORMAL LOW (ref 8.9–10.3)
Chloride: 95 mmol/L — ABNORMAL LOW (ref 98–111)
Creatinine, Ser: 1.02 mg/dL (ref 0.61–1.24)
GFR, Estimated: >60 mL/min (ref >60)
Glucose, Bld: 89 mg/dL (ref 70–99)
Potassium: 3.4 mmol/L — ABNORMAL LOW (ref 3.5–5.1)
Sodium: 134 mmol/L — ABNORMAL LOW (ref 135–145)

## 2024-01-11 LAB — VANCOMYCIN, RANDOM: Vancomycin Rm: 17 ug/mL

## 2024-01-11 SURGERY — LOWER EXTREMITY ANGIOGRAPHY
Anesthesia: Moderate Sedation | Laterality: Right

## 2024-01-11 SURGERY — EGD (ESOPHAGOGASTRODUODENOSCOPY)
Anesthesia: General

## 2024-01-11 MED ORDER — MORPHINE SULFATE (PF) 2 MG/ML IV SOLN
1.0000 mg | Freq: Four times a day (QID) | INTRAVENOUS | Status: DC | PRN
  Administered 2024-01-11 – 2024-01-13 (×4): 1 mg via INTRAVENOUS
  Filled 2024-01-11 (×4): qty 1

## 2024-01-11 MED ORDER — ORAL CARE MOUTH RINSE: 15.0000 mL | OROMUCOSAL | Status: DC | PRN

## 2024-01-11 MED ORDER — MIDAZOLAM HCL 2 MG/ML PO SYRP: 8.0000 mg | ORAL_SOLUTION | Freq: Once | ORAL | Status: DC | PRN

## 2024-01-11 MED ORDER — POTASSIUM CHLORIDE 10 MEQ/100ML IV SOLN
10.0000 meq | INTRAVENOUS | Status: AC
Start: 2024-01-11 — End: 2024-01-11
  Administered 2024-01-11 (×2): 10 meq via INTRAVENOUS
  Filled 2024-01-11 (×2): qty 100

## 2024-01-11 MED ORDER — MIDAZOLAM HCL 5 MG/5ML IJ SOLN
INTRAMUSCULAR | Status: AC
  Filled 2024-01-11: qty 5

## 2024-01-11 MED ORDER — FAMOTIDINE 20 MG PO TABS: 40.0000 mg | ORAL_TABLET | Freq: Once | ORAL | Status: DC | PRN

## 2024-01-11 MED ORDER — HEPARIN (PORCINE) IN NACL 1000-0.9 UT/500ML-% IV SOLN
INTRAVENOUS | Status: DC | PRN
  Administered 2024-01-11: 1000 mL

## 2024-01-11 MED ORDER — FENTANYL CITRATE (PF) 100 MCG/2ML IJ SOLN
INTRAMUSCULAR | Status: DC | PRN
  Administered 2024-01-11: 50 ug via INTRAVENOUS

## 2024-01-11 MED ORDER — IODIXANOL 320 MG/ML IV SOLN
INTRAVENOUS | Status: DC | PRN
  Administered 2024-01-11: 35 mL

## 2024-01-11 MED ORDER — MIDAZOLAM HCL (PF) 2 MG/2ML IJ SOLN
INTRAMUSCULAR | Status: DC | PRN
  Administered 2024-01-11: 2 mg via INTRAVENOUS

## 2024-01-11 MED ORDER — LIDOCAINE-EPINEPHRINE (PF) 1 %-1:200000 IJ SOLN
INTRAMUSCULAR | Status: DC | PRN
  Administered 2024-01-11: 10 mL

## 2024-01-11 MED ORDER — FENTANYL CITRATE (PF) 100 MCG/2ML IJ SOLN
INTRAMUSCULAR | Status: AC
  Filled 2024-01-11: qty 2

## 2024-01-11 MED ORDER — METHYLPREDNISOLONE SODIUM SUCC 125 MG IJ SOLR: 125.0000 mg | Freq: Once | INTRAMUSCULAR | Status: DC | PRN

## 2024-01-11 MED ORDER — VANCOMYCIN HCL IN DEXTROSE 1-5 GM/200ML-% IV SOLN
1000.0000 mg | Freq: Two times a day (BID) | INTRAVENOUS | Status: DC
  Administered 2024-01-11 – 2024-01-13 (×4): 1000 mg via INTRAVENOUS
  Filled 2024-01-11 (×5): qty 200

## 2024-01-11 MED ORDER — DIPHENHYDRAMINE HCL 50 MG/ML IJ SOLN: 50.0000 mg | Freq: Once | INTRAMUSCULAR | Status: DC | PRN

## 2024-01-11 MED ORDER — SODIUM CHLORIDE 0.9 % IV SOLN: INTRAVENOUS | Status: DC

## 2024-01-11 MED ORDER — HEPARIN SODIUM (PORCINE) 1000 UNIT/ML IJ SOLN
INTRAMUSCULAR | Status: AC
  Filled 2024-01-11: qty 10

## 2024-01-11 MED ORDER — CEFAZOLIN SODIUM-DEXTROSE 2-4 GM/100ML-% IV SOLN: 2.0000 g | INTRAVENOUS | Status: DC

## 2024-01-11 SURGICAL SUPPLY — 10 items
CATH ANGIO 5F PIGTAIL 65CM (CATHETERS) IMPLANT
COVER PROBE ULTRASOUND 5X96 (MISCELLANEOUS) IMPLANT
DEVICE VASC CLSR CELT ART 6 (Vascular Products) IMPLANT
GLIDEWIRE ADV .035X260CM (WIRE) IMPLANT
PACK ANGIOGRAPHY (CUSTOM PROCEDURE TRAY) ×1 IMPLANT
SHEATH BRITE TIP 5FRX11 (SHEATH) IMPLANT
SHEATH BRITE TIP 6FRX11 (SHEATH) IMPLANT
SYR MEDRAD MARK 7 150ML (SYRINGE) IMPLANT
TUBING CONTRAST HIGH PRESS 72 (TUBING) IMPLANT
WIRE J 3MM .035X145CM (WIRE) IMPLANT

## 2024-01-11 NOTE — Plan of Care (Signed)

## 2024-01-11 NOTE — Plan of Care (Signed)
 This patient remains on AR-2A as of time of writing, The patient is alert and oriented x 4, verbally aggressive towards staff. No supplemental O2 requirement. Multiple wounds to BLE; wound care per orders. Single lumen midline to LUE is sole venous access. Ptient remaisn NPO; plan for EGD tomorrow.   Problem: Education: Goal: Knowledge of General Education information will improve Description: Including pain rating scale, medication(s)/side effects and non-pharmacologic comfort measures Outcome: Not Progressing   Problem: Health Behavior/Discharge Planning: Goal: Ability to manage health-related needs will improve Outcome: Not Progressing   Problem: Clinical Measurements: Goal: Ability to maintain clinical measurements within normal limits will improve Outcome: Not Progressing Goal: Will remain free from infection Outcome: Not Progressing Goal: Diagnostic test results will improve Outcome: Not Progressing Goal: Respiratory complications will improve Outcome: Not Progressing Goal: Cardiovascular complication will be avoided Outcome: Not Progressing   Problem: Activity: Goal: Risk for activity intolerance will decrease Outcome: Not Progressing   Problem: Nutrition: Goal: Adequate nutrition will be maintained Outcome: Not Progressing   Problem: Coping: Goal: Level of anxiety will decrease Outcome: Not Progressing   Problem: Elimination: Goal: Will not experience complications related to bowel motility Outcome: Not Progressing Goal: Will not experience complications related to urinary retention Outcome: Not Progressing   Problem: Pain Managment: Goal: General experience of comfort will improve and/or be controlled Outcome: Not Progressing   Problem: Safety: Goal: Ability to remain free from injury will improve Outcome: Not Progressing   Problem: Skin Integrity: Goal: Risk for impaired skin integrity will decrease Outcome: Not Progressing   Problem: Education: Goal:  Ability to demonstrate management of disease process will improve Outcome: Not Progressing Goal: Ability to verbalize understanding of medication therapies will improve Outcome: Not Progressing Goal: Individualized Educational Video(s) Outcome: Not Progressing   Problem: Activity: Goal: Capacity to carry out activities will improve Outcome: Not Progressing   Problem: Cardiac: Goal: Ability to achieve and maintain adequate cardiopulmonary perfusion will improve Outcome: Not Progressing   Problem: Education: Goal: Knowledge of disease and its progression will improve Outcome: Not Progressing Goal: Individualized Educational Video(s) Outcome: Not Progressing   Problem: Fluid Volume: Goal: Compliance with measures to maintain balanced fluid volume will improve Outcome: Not Progressing   Problem: Health Behavior/Discharge Planning: Goal: Ability to manage health-related needs will improve Outcome: Not Progressing   Problem: Nutritional: Goal: Ability to make healthy dietary choices will improve Outcome: Not Progressing   Problem: Clinical Measurements: Goal: Complications related to the disease process, condition or treatment will be avoided or minimized Outcome: Not Progressing

## 2024-01-11 NOTE — Progress Notes (Signed)
 John Copping, MD [facility]   [address], [address] [place], [place] [address] Phone: [phone] Fax : [phone]   Subjective: The patient was seen by Dr. Therisa on Saturday and at that time refused an upper endoscopy stating that he had multiple upper endoscopies in the past and was not helped with them.  The patient was amenable after some car regimen by his family to get up for GI barium swallow. Despite refusing the upper endoscopy on Saturday he now is adamant that he has it.  The patient has been set up for a vascular study today for his lower extremity digital gangrene with ulcers. The patient is scheduled for vascular evaluation today and possibly the need of heparin  or anticoagulation   Objective: Vital signs in last 24 hours: Vitals:   01/11/24 0455 01/11/24 0840 01/11/24 0840 01/11/24 1226  BP: 114/75 129/72    Pulse: 83  80 81  Resp:  18    Temp: 97.8 F (36.6 C) 97.9 F (36.6 C)    TempSrc: Oral Oral    SpO2: 100%  100% 100%  Weight:      Height:       Weight change:   Intake/Output Summary (Last 24 hours) at 01/11/2024 1234 Last data filed at 01/11/2024 0800 Gross per 24 hour  Intake 686.73 ml  Output 700 ml  Net -13.27 ml     Exam: Heart:: Regular rate and rhythm or without murmur or extra heart sounds Lungs: normal and clear to auscultation and percussion Abdomen: soft, nontender, normal bowel sounds   Lab Results: @LABTEST2 @ Micro Results: Recent Results (from the past 240 hours)  Blood culture (routine x 2)     Status: None (Preliminary result)   Collection Time: 01/07/24  1:56 PM   Specimen: BLOOD  Result Value Ref Range Status   Specimen Description BLOOD BLOOD LEFT ARM  Final   Special Requests   Final    BOTTLES DRAWN AEROBIC AND ANAEROBIC Blood Culture adequate volume   Culture   Final    NO GROWTH 4 DAYS Performed at [facility], [address] [place], [place] [address]    Report Status PENDING  Incomplete  MRSA Next  Gen by PCR, Nasal     Status: None   Collection Time: 01/07/24  4:15 PM   Specimen: Nasal Mucosa; Nasal Swab  Result Value Ref Range Status   MRSA by PCR Next Gen NOT DETECTED NOT DETECTED Final    Comment: (NOTE) The GeneXpert MRSA Assay (FDA approved for NASAL specimens only), is one component of a comprehensive MRSA colonization surveillance program. It is not intended to diagnose MRSA infection nor to guide or monitor treatment for MRSA infections. Test performance is not FDA approved in patients less than 67 years old. Performed at [facility], [address] [place], [place] [address]   Blood culture (routine x 2)     Status: None (Preliminary result)   Collection Time: 01/07/24 10:06 PM   Specimen: BLOOD  Result Value Ref Range Status   Specimen Description BLOOD BLOOD RIGHT HAND  Final   Special Requests   Final    BOTTLES DRAWN AEROBIC AND ANAEROBIC Blood Culture adequate volume   Culture   Final    NO GROWTH 4 DAYS Performed at [facility], [address] [place], [place] [address]    Report Status PENDING  Incomplete   Studies/Results: DG ESOPHAGUS W SINGLE CM (SOL OR THIN BA) Result Date: 01/10/2024 CLINICAL  DATA:  73 year old male with a history of GERD, esophageal dysphagia, and previous barium swallow on 07/13/2023 concerning for high-grade stricture. Patient underwent EGD with dilation on 07/14/2023 with unclear benefits postprocedure. Patient continues to report minimal improvement in dysphagia. Request for repeat barium swallow to evaluate for dysmotility versus recurrent/persistent stricture. EXAM: ESOPHAGUS/BARIUM SWALLOW/TABLET STUDY TECHNIQUE: Single contrast examination was performed using thin liquid barium. This exam was performed by [facility] PA-C, and was supervised and interpreted by Dr. Ree Molt. FLUOROSCOPY: Radiation Exposure Index (as provided by the fluoroscopic device): 18.8 mGy Kerma COMPARISON:  DG ESOPHAGUS W SINGLE  CM-07/13/2023 FINDINGS: Swallowing: Limited evaluation, but no evidence of gross aspiration. Pharynx: Unremarkable. Esophagus: Limited evaluation due to single contrast only; however, no evidence of mucosal abnormalities. Redemonstration of approximately 5 cm long smooth walled marked narrowing/stricture of the lower most esophagus extending up to the GE junction. There is proximal mild-to-moderate esophageal dilation. There are also resultant multiple underlying non propulsive peristaltic waves. Esophageal motility: Dysmotility with intermittent proximal escape. Significantly delayed passage of contrast from the distal esophagus into the stomach secondary to stricture. Findings are essentially similar to the prior study no again represent benign stricture in the setting of chronic acid reflux. Correlate clinically and with upper GI endoscopy. Hiatal Hernia: None visualized. Gastroesophageal reflux: None visualized. However, evaluation is limited due to marked lower esophageal narrowing. Other: Limited evaluation due to minimal patient mobility. Entirety of exam performed at approximately 20 degree table tilt. IMPRESSION: 1. Redemonstration of smooth walled high-grade stricture of the lower most thoracic esophagus reaching up to the GE junction. 2. Dysmotility likely secondary to stricture. Electronically Signed   By: Ree Molt M.D.   On: 01/10/2024 12:27   ECHOCARDIOGRAM COMPLETE Result Date: 01/10/2024    ECHOCARDIOGRAM REPORT   Patient Name:   John Booker Date of Exam: 01/09/2024 Medical Rec #:  [id number]      Height:       75.0 in Accession #:    [id number]     Weight:       185.0 lb Date of Birth:  [birth year]      BSA:          2.123 m Patient Age:    73 years       BP:           129/87 mmHg Patient Gender: M              HR:           96 bpm. Exam Location:  [facility] Procedure: 2D Echo, Cardiac Doppler and Color Doppler (Both Spectral and Color            Flow Doppler were utilized during procedure).  Indications:     CHF I50.31  History:         Patient has prior history of Echocardiogram examinations, most                  recent 11/24/2022.  Sonographer:     Thedora Louder RDCS, FASE Referring Phys:  [id number] ELVAN SOR Diagnosing Phys: Denyse Bathe  Sonographer Comments: Technically challenging study due to limited acoustic windows, no apical window, suboptimal parasternal window and no subcostal window. Image acquisition challenging due to respiratory motion and Image acquisition challenging due to  patient body habitus. The patient could not tolerate pressing on left side. Suboptimal apical acoustic windows. IMPRESSIONS  1. Left ventricular ejection fraction, by estimation, is 60 to 65%. The left ventricle has normal function.  The left ventricle has no regional wall motion abnormalities. Left ventricular diastolic function could not be evaluated.  2. Right ventricular systolic function is normal. The right ventricular size is normal.  3. A small pericardial effusion is present. The pericardial effusion is circumferential.  4. The mitral valve is normal in structure. No evidence of mitral valve regurgitation. No evidence of mitral stenosis.  5. The aortic valve is normal in structure. Aortic valve regurgitation is not visualized. No aortic stenosis is present.  6. The inferior vena cava is normal in size with greater than 50% respiratory variability, suggesting right atrial pressure of 3 mmHg. FINDINGS  Left Ventricle: Left ventricular ejection fraction, by estimation, is 60 to 65%. The left ventricle has normal function. The left ventricle has no regional wall motion abnormalities. Strain was performed and the global longitudinal strain is indeterminate. The left ventricular internal cavity size was normal in size. There is no left ventricular hypertrophy. Left ventricular diastolic function could not be evaluated. Right Ventricle: The right ventricular size is normal. No increase in right ventricular  wall thickness. Right ventricular systolic function is normal. Left Atrium: Left atrial size was normal in size. Right Atrium: Right atrial size was normal in size. Pericardium: A small pericardial effusion is present. The pericardial effusion is circumferential. Mitral Valve: The mitral valve is normal in structure. No evidence of mitral valve regurgitation. No evidence of mitral valve stenosis. Tricuspid Valve: The tricuspid valve is normal in structure. Tricuspid valve regurgitation is not demonstrated. No evidence of tricuspid stenosis. Aortic Valve: The aortic valve is normal in structure. Aortic valve regurgitation is not visualized. No aortic stenosis is present. Pulmonic Valve: The pulmonic valve was normal in structure. Pulmonic valve regurgitation is not visualized. No evidence of pulmonic stenosis. Aorta: The aortic root is normal in size and structure. Venous: The inferior vena cava is normal in size with greater than 50% respiratory variability, suggesting right atrial pressure of 3 mmHg. IAS/Shunts: No atrial level shunt detected by color flow Doppler. Additional Comments: 3D was performed not requiring image post processing on an independent workstation and was indeterminate.  LEFT VENTRICLE PLAX 2D LVIDd:         3.10 cm LVIDs:         2.00 cm LV PW:         1.30 cm LV IVS:        1.00 cm LVOT diam:     2.40 cm LVOT Area:     4.52 cm  LEFT ATRIUM         Index LA diam:    2.70 cm 1.27 cm/m                        PULMONIC VALVE AORTA                 PV Vmax:       1.28 m/s Ao Root diam: 3.70 cm PV Peak grad:  6.6 mmHg   SHUNTS Systemic Diam: 2.40 cm Denyse Bathe Electronically signed by Denyse Bathe Signature Date/Time: 01/10/2024/8:50:12 AM    Final    Medications: I have reviewed the patient's current medications. Scheduled Meds:  vitamin C   500 mg Oral Daily   cholecalciferol   2,000 Units Oral Daily   folic acid   1 mg Oral Daily   hydrALAZINE   25 mg Oral TID   iron polysaccharides  150 mg  Oral Daily   metoprolol  succinate  25 mg Oral Daily  multivitamin with minerals  1 tablet Oral Daily   pantoprazole   40 mg Oral BID   sodium chloride  flush  10-40 mL Intracatheter Q12H   thiamine   100 mg Oral Daily   Or   thiamine   100 mg Intravenous Daily   vancomycin  variable dose per unstable renal function (pharmacist dosing)   Does not apply See admin instructions   Continuous Infusions:  sodium chloride      cefTRIAXone  (ROCEPHIN )  IV 1 g (01/11/24 1232)   potassium chloride      vancomycin      PRN Meds:.acetaminophen  **OR** acetaminophen , bisacodyl, diphenoxylate-atropine, HYDROcodone -acetaminophen , ondansetron  **OR** ondansetron  (ZOFRAN ) IV, polyethylene glycol, sodium chloride  flush   Assessment: Principal Problem:   Anasarca Active Problems:   Peripheral edema   Cellulitis of left lower extremity    Plan: This patient is due to have a vascular procedure today and will need heparin  during the procedure.  The patient has decided that now he wants the upper endoscopy and I was called by the hospitalist to possibly see if he can be done today.  Due to the patient needing vascular intervention today and heparin  dilation of the esophagus would be dangerous and we will see what happens with the vascular procedure prior to setting him up for an upper endoscopy at this time.  The patient has been explained this but appears to not grasp the gravity of his toe ischemia and urgency of that over his dysphagia.  I have discussed this case with the hospitalist and the vascular surgeon.    LOS: 4 days   John Copping, MD.FACG 01/11/2024, 12:34 PM Pager [phone] 7am-5pm  Check AMION for 5pm -7am coverage and on weekends

## 2024-01-11 NOTE — Consult Note (Signed)
 Pharmacy Antibiotic Note  John Booker is a 73 y.o. male admitted on 01/07/2024 with lymphedema with increased weeping and concern for cellulitis.  Pharmacy has been consulted for vancomycin  dosing.  Scr 1.29 (elevated from baseline of 0.8-1.0) > 1.02 today, will transition to conventional scheduled dosing.  Plan: Vancomycin  1000 mg IV Q 12 hrs. Goal AUC 400-550. Expected AUC: 494.3 Expected Cmin: 14.3 SCr used: 1.02, Vd used: 0.72, TBW<IBW    Height: 6' 3 (190.5 cm) Weight: 83.9 kg (185 lb) IBW/kg (Calculated) : 84.5  Temp (24hrs), Avg:98.1 F (36.7 C), Min:97.8 F (36.6 C), Max:98.4 F (36.9 C)  Recent Labs  Lab 01/07/24 1043 01/08/24 0405 01/09/24 0611 01/09/24 1351 01/10/24 0505 01/10/24 0853 01/10/24 1651 01/11/24 0347 01/11/24 0951  WBC 8.8 6.5  --   --   --   --   --   --   --   CREATININE 1.29* 1.54* 1.58*  --  1.35*  --  1.34* 1.02  --   VANCORANDOM  --   --   --    < >  --  14  --   --  17   < > = values in this interval not displayed.    Estimated Creatinine Clearance: 75.4 mL/min (by C-G formula based on SCr of 1.02 mg/dL).    Allergies  Allergen Reactions   Adalat [Nifedipine]     Foot swelling.    Amlodipine  Other (See Comments)    Leg swelling    Antimicrobials this admission: vancomycin  10/30 >>  Ceftriaxone  10/30 >>   Microbiology results: 10/30 BCx: NGTD  Thank you for allowing pharmacy to be a part of this patient's care.  Juanna Pudlo A Janiyha Montufar, PharmD Clinical Pharmacist 01/11/2024 11:46 AM

## 2024-01-11 NOTE — Interval H&P Note (Signed)
 History and Physical Interval Note:  01/11/2024 1:36 PM  John Booker  has presented today for surgery, with the diagnosis of Right LOwer Extremity Ischemia.  The various methods of treatment have been discussed with the patient and family. After consideration of risks, benefits and other options for treatment, the patient has consented to  Procedure(s): Lower Extremity Angiography (Right) as a surgical intervention.  The patient's history has been reviewed, patient examined, no change in status, stable for surgery.  I have reviewed the patient's chart and labs.  Questions were answered to the patient's satisfaction.     Loretta Doutt

## 2024-01-11 NOTE — Progress Notes (Signed)
 [facility]  - [place] at [facility]   PATIENT NAME: John Booker    MR#:  [id number]  DATE OF BIRTH:  Sep 17, 1950  SUBJECTIVE:  Came in with increasing swelling of both lower extremities, weeping edema and lower extremity right third and fifth digit gangrene along with ulcers on the TBL [place] and ankle  Pt is NPO--he is very frustrated and wants to get everything done and get out of this place dicussed pros and cons of the Angio and EGD but he is jut very 'upset   Unit AD Corean has been informed   VITALS:  Blood pressure 129/72, pulse 80, temperature 97.9 F (36.6 C), temperature source Oral, resp. rate 18, height 6' 3 (1.905 m), weight 83.9 kg, SpO2 100%.  PHYSICAL EXAMINATION:   GENERAL:  73 y.o.-year-old patient with no acute distress.  LUNGS: Normal breath sounds bilaterally, no wheezing CARDIOVASCULAR: S1, S2 normal. No murmur   ABDOMEN: Soft, nontender, nondistended. Bowel sounds present.  EXTREMITIES:  NEUROLOGIC: nonfocal  patient is alert and awake   LABORATORY PANEL:  CBC Recent Labs  Lab 01/08/24 0405  WBC 6.5  HGB 10.7*  HCT 32.8*  PLT 230    Chemistries  Recent Labs  Lab 01/07/24 1043 01/08/24 0405 01/10/24 1651 01/11/24 0347  NA 134*   < > 136 134*  K 4.5   < > 4.0 3.4*  CL 103   < > 92* 95*  CO2 20*   < > 33* 29  GLUCOSE 93   < > 170* 89  BUN 19   < > 26* 26*  CREATININE 1.29*   < > 1.34* 1.02  CALCIUM 8.4*   < > 8.2* 8.5*  MG 1.6*   < > 1.8  --   AST 22  --   --   --   ALT 16  --   --   --   ALKPHOS 79  --   --   --   BILITOT 1.0  --   --   --    < > = values in this interval not displayed.   Cardiac Enzymes No results for input(s): TROPONINI in the last 168 hours. RADIOLOGY:  DG ESOPHAGUS W SINGLE CM (SOL OR THIN BA) Result Date: 01/10/2024 CLINICAL DATA:  73 year old male with a history of GERD, esophageal dysphagia, and previous barium swallow on 07/13/2023 concerning for high-grade stricture. Patient  underwent EGD with dilation on 07/14/2023 with unclear benefits postprocedure. Patient continues to report minimal improvement in dysphagia. Request for repeat barium swallow to evaluate for dysmotility versus recurrent/persistent stricture. EXAM: ESOPHAGUS/BARIUM SWALLOW/TABLET STUDY TECHNIQUE: Single contrast examination was performed using thin liquid barium. This exam was performed by [facility] PA-C, and was supervised and interpreted by Dr. Ree Molt. FLUOROSCOPY: Radiation Exposure Index (as provided by the fluoroscopic device): 18.8 mGy Kerma COMPARISON:  DG ESOPHAGUS W SINGLE CM-07/13/2023 FINDINGS: Swallowing: Limited evaluation, but no evidence of gross aspiration. Pharynx: Unremarkable. Esophagus: Limited evaluation due to single contrast only; however, no evidence of mucosal abnormalities. Redemonstration of approximately 5 cm long smooth walled marked narrowing/stricture of the lower most esophagus extending up to the GE junction. There is proximal mild-to-moderate esophageal dilation. There are also resultant multiple underlying non propulsive peristaltic waves. Esophageal motility: Dysmotility with intermittent proximal escape. Significantly delayed passage of contrast from the distal esophagus into the stomach secondary to stricture. Findings are essentially similar to the prior study no again represent benign stricture in the setting of chronic acid reflux.  Correlate clinically and with upper GI endoscopy. Hiatal Hernia: None visualized. Gastroesophageal reflux: None visualized. However, evaluation is limited due to marked lower esophageal narrowing. Other: Limited evaluation due to minimal patient mobility. Entirety of exam performed at approximately 20 degree table tilt. IMPRESSION: 1. Redemonstration of smooth walled high-grade stricture of the lower most thoracic esophagus reaching up to the GE junction. 2. Dysmotility likely secondary to stricture. Electronically Signed   By: Ree Molt M.D.   On: 01/10/2024 12:27   ECHOCARDIOGRAM COMPLETE Result Date: 01/10/2024    ECHOCARDIOGRAM REPORT   Patient Name:   John Booker Date of Exam: 01/09/2024 Medical Rec #:  [id number]      Height:       75.0 in Accession #:    [id number]     Weight:       185.0 lb Date of Birth:  23-Aug-1950      BSA:          2.123 m Patient Age:    72 years       BP:           129/87 mmHg Patient Gender: M              HR:           96 bpm. Exam Location:  [facility] Procedure: 2D Echo, Cardiac Doppler and Color Doppler (Both Spectral and Color            Flow Doppler were utilized during procedure). Indications:     CHF I50.31  History:         Patient has prior history of Echocardiogram examinations, most                  recent 11/24/2022.  Sonographer:     Thedora Louder RDCS, FASE Referring Phys:  [id number] ELVAN SOR Diagnosing Phys: Denyse Bathe  Sonographer Comments: Technically challenging study due to limited acoustic windows, no apical window, suboptimal parasternal window and no subcostal window. Image acquisition challenging due to respiratory motion and Image acquisition challenging due to  patient body habitus. The patient could not tolerate pressing on left side. Suboptimal apical acoustic windows. IMPRESSIONS  1. Left ventricular ejection fraction, by estimation, is 60 to 65%. The left ventricle has normal function. The left ventricle has no regional wall motion abnormalities. Left ventricular diastolic function could not be evaluated.  2. Right ventricular systolic function is normal. The right ventricular size is normal.  3. A small pericardial effusion is present. The pericardial effusion is circumferential.  4. The mitral valve is normal in structure. No evidence of mitral valve regurgitation. No evidence of mitral stenosis.  5. The aortic valve is normal in structure. Aortic valve regurgitation is not visualized. No aortic stenosis is present.  6. The inferior vena cava is normal in size with greater  than 50% respiratory variability, suggesting right atrial pressure of 3 mmHg. FINDINGS  Left Ventricle: Left ventricular ejection fraction, by estimation, is 60 to 65%. The left ventricle has normal function. The left ventricle has no regional wall motion abnormalities. Strain was performed and the global longitudinal strain is indeterminate. The left ventricular internal cavity size was normal in size. There is no left ventricular hypertrophy. Left ventricular diastolic function could not be evaluated. Right Ventricle: The right ventricular size is normal. No increase in right ventricular wall thickness. Right ventricular systolic function is normal. Left Atrium: Left atrial size was normal in size. Right Atrium: Right atrial size was normal in  size. Pericardium: A small pericardial effusion is present. The pericardial effusion is circumferential. Mitral Valve: The mitral valve is normal in structure. No evidence of mitral valve regurgitation. No evidence of mitral valve stenosis. Tricuspid Valve: The tricuspid valve is normal in structure. Tricuspid valve regurgitation is not demonstrated. No evidence of tricuspid stenosis. Aortic Valve: The aortic valve is normal in structure. Aortic valve regurgitation is not visualized. No aortic stenosis is present. Pulmonic Valve: The pulmonic valve was normal in structure. Pulmonic valve regurgitation is not visualized. No evidence of pulmonic stenosis. Aorta: The aortic root is normal in size and structure. Venous: The inferior vena cava is normal in size with greater than 50% respiratory variability, suggesting right atrial pressure of 3 mmHg. IAS/Shunts: No atrial level shunt detected by color flow Doppler. Additional Comments: 3D was performed not requiring image post processing on an independent workstation and was indeterminate.  LEFT VENTRICLE PLAX 2D LVIDd:         3.10 cm LVIDs:         2.00 cm LV PW:         1.30 cm LV IVS:        1.00 cm LVOT diam:     2.40 cm  LVOT Area:     4.52 cm  LEFT ATRIUM         Index LA diam:    2.70 cm 1.27 cm/m                        PULMONIC VALVE AORTA                 PV Vmax:       1.28 m/s Ao Root diam: 3.70 cm PV Peak grad:  6.6 mmHg   SHUNTS Systemic Diam: 2.40 cm Shaukat Khan Electronically signed by Denyse Bathe Signature Date/Time: 01/10/2024/8:50:12 AM    Final     Assessment and Plan  LLOYDE LUDLAM is a 73 y.o. male with Past medical history of HTN, HFpEF, CKD stage II, lymphedema of bilateral lower extremities, esophageal stricture s/p dilatation, chronic hiccups as reviewed from EMR, presented at [facility] ED with complaining of bilateral lower extremity and upper extremity edema which is gradually getting worse.  Patient is not a very good historian, as per patient's daughter lower extremity edema is gradually getting worse for the past 1 week and it is weeping.  There is an area of erythema in the left lower extremity on the lateral side which he scratched maybe had trauma.   # Anasarca\Hypoalbuminemia BNP 106.9 slightly elevated --Started Lasix  IV infusion --diuresed well. D/c lasix  gtt --renal function stable   # Cellulitis of bilateral lower extremities  # Ischemic right first toe ulcer and ischemia of third toe possible underlying PAD Clinically looks erythematous and tender WBC within normal range Empirically started antibiotics ceftriaxone  and vancomycin  Pharmacy consulted for Vanco dosing and trough monitoring De-escalate as per improvement --Vascular plans angiogram on Monday eper Dr Marea  # History of esophageal stricture and dilatation, chronic hiccups --Continue PPI twice daily --Thorazine  12.5 mg IV x 1 dose given for hiccups --d/w GI dr Anna--pt does not feel EGD with dilatation helped before.  --Ba swallow shows HIGH grade stricture--will need EGD with dilatation. --Dr Jinny aware and trying to find the best time to get EGD done since angiogram will require Heparin  unless done diagnostic for now  and intervention later to avoind any complactions with Esophageal dilatation.   # EtOH  abuse --As per patient he quit several years ago. --Patient's daughter is concerned that he is also drinking alcohol but she is not aware how much.  -- lives alone. --no s/s of WD --cont multivitamin, thiamine  and folic acid .   # AKI on CKD stage II Baseline sCr 0.81, eGFR >60 Scr 1.29 on admission --creat stable at 1.35 --good uop with IV lasix --now d/ced   # Hypomagnesemia, mag repleted   # Iron deficiency, started oral iron supplement with vitamin C    # HTN, HFpEF --on losartan  and metoprolol  home dose --Prior echo: LVEF 60 to 65%, moderate LVH --cont BB and hydralazine  --Ace on hold due to creat      DVT Prophylaxis: Subcutaneous Lovenox     Procedures: Family communication :dter Consults : podiatry, vascular surgery,GI CODE STATUS: DNR DVT Prophylaxis : Lovenox  Level of care: Progressive Status is: Inpatient Remains inpatient appropriate because: bilateral lower extremity edema, cellulitis, severe dysphagia    TOTAL TIME TAKING CARE OF THIS PATIENT: 40 minutes.  >50% time spent on counselling and coordination of care  Note: This dictation was prepared with Dragon dictation along with smaller phrase technology. Any transcriptional errors that result from this process are unintentional.  Leita Blanch M.D    [facility]   CC: Primary care physician; Alla Amis, MD

## 2024-01-11 NOTE — Progress Notes (Signed)
 PT Cancellation Note  Patient Details Name: NAHOM CARFAGNO MRN: [id number] DOB: June 23, 1950   Cancelled Treatment:     PT attempt. During introduction to pt, he gets agitated and states he was suppose to go for procedure earlier. Ill be dead by the time I get to work with you if they don't let me eat. Educated pt on why he is NPO but pt does not seem to understand.  Remains agitated when encouraged to participate. Will return at a later time when pt more willing to participate.     Rankin KATHEE Essex 01/11/2024, 9:46 AM

## 2024-01-11 NOTE — Op Note (Signed)
 [facility]  Percutaneous Study/Intervention Procedural Note   Date of Surgery: 01/11/2024  Surgeon(s):Graham Doukas    Assistants:none  Pre-operative Diagnosis: PAD with ulceration and gangrenous changes right lower extremity  Post-operative diagnosis:  Same  Procedure(s) Performed:             1.  Ultrasound guidance for vascular access left femoral artery             2.  Catheter placement into right SFA from left femoral approach             3.  Aortogram and selective right lower extremity angiogram             4.  Celt closure device left femoral artery  EBL: 3 cc  Contrast: 35 cc  Fluoro Time: 1.5 minutes  Moderate Conscious Sedation Time: approximately 17 minutes using 2 mg of Versed  and 50 mcg of Fentanyl               Indications:  Patient is a 72 y.o.male with ulceration and gangrenous changes to the right lower extremity. The patient is brought in for angiography for further evaluation and potential treatment.  Due to the limb threatening nature of the situation, angiogram was performed for attempted limb salvage. The patient is aware that if the procedure fails, amputation would be expected.  The patient also understands that even with successful revascularization, amputation may still be required due to the severity of the situation.  Risks and benefits are discussed and informed consent is obtained.   Procedure:  The patient was identified and appropriate procedural time out was performed.  The patient was then placed supine on the table and prepped and draped in the usual sterile fashion. Moderate conscious sedation was administered during a face to face encounter with the patient throughout the procedure with my supervision of the RN administering medicines and monitoring the patient's vital signs, pulse oximetry, telemetry and mental status throughout from the start of the procedure until the patient was taken to the recovery room. Ultrasound was  used to evaluate the left common femoral artery.  It was patent .  A digital ultrasound image was acquired.  A Seldinger needle was used to access the left common femoral artery under direct ultrasound guidance and a permanent image was performed.  A 0.035 J wire was advanced without resistance and a 5Fr sheath was placed.  Pigtail catheter was placed into the aorta and an AP aortogram was performed. This demonstrated normal renal arteries and normal aorta.  There was a fairly steep aortic bifurcation.  The left distal common iliac artery had about a 70% stenosis.  The right common iliac artery had tandem stenosis about 2 cm apart that were approaching 50% for each lesion.  Both external iliac arteries did not have significant disease. I then crossed the aortic bifurcation and advanced to the right femoral head and then into the proximal right SFA to opacify distally. Selective right lower extremity angiogram was then performed. This demonstrated an occlusion in the proximal to mid SFA that was abrupt and appeared somewhat thrombotic.  There was reconstitution of the distal SFA and the popliteal artery was fairly normal.  There was also an occlusion of the tibioperoneal trunk and the proximal portion of both the peroneal and posterior tibial arteries.  The posterior tibial artery was the largest tibial vessel distally and after the proximal occlusion was continuous to the foot.  The peroneal artery was small and provided very  little flow distally.  The anterior tibial artery was patent proximally but occluded and did not reconstitute distally.  No intervention was performed today given his upcoming EGD and the need for no anticoagulation at this time.  Extensive lower extremity revascularization at a later date.  I elected to terminate the procedure. The sheath was removed and Celt closure device was deployed in the left femoral artery with excellent hemostatic result. The patient was taken to the recovery room in  stable condition having tolerated the procedure well.  Findings:               Aortogram:  This demonstrated normal renal arteries and normal aorta.  There was a fairly steep aortic bifurcation.  The left distal common iliac artery had about a 70% stenosis.  The right common iliac artery had tandem stenosis about 2 cm apart that were approaching 50% for each lesion.  Both external iliac arteries did not have significant disease.             Right lower Extremity:  This demonstrated an occlusion in the proximal to mid SFA that was abrupt and appeared somewhat thrombotic.  There was reconstitution of the distal SFA and the popliteal artery was fairly normal.  There was also an occlusion of the tibioperoneal trunk and the proximal portion of both the peroneal and posterior tibial arteries.  The posterior tibial artery was the largest tibial vessel distally and after the proximal occlusion was continuous to the foot.  The peroneal artery was small and provided very little flow distally.  The anterior tibial artery was patent proximally but occluded and did not reconstitute distally.   Disposition: Patient was taken to the recovery room in stable condition having tolerated the procedure well.  Complications: None  Selinda Gu 01/11/2024 2:42 PM   This note was created with Dragon Medical transcription system. Any errors in dictation are purely unintentional.

## 2024-01-12 ENCOUNTER — Inpatient Hospital Stay

## 2024-01-12 ENCOUNTER — Encounter
Admission: EM | Disposition: A | Discharge status: Skilled Nursing Facility | Payer: Self-pay | Source: Home / Self Care | Attending: Internal Medicine

## 2024-01-12 ENCOUNTER — Encounter: Payer: Self-pay | Admitting: Student

## 2024-01-12 DIAGNOSIS — R601 Generalized edema: Secondary | ICD-10-CM | POA: Diagnosis not present

## 2024-01-12 DIAGNOSIS — R1310 Dysphagia, unspecified: Secondary | ICD-10-CM | POA: Diagnosis not present

## 2024-01-12 DIAGNOSIS — K222 Esophageal obstruction: Secondary | ICD-10-CM

## 2024-01-12 HISTORY — PX: BALLOON DILATION: SHX5330

## 2024-01-12 HISTORY — PX: ESOPHAGOGASTRODUODENOSCOPY: SHX5428

## 2024-01-12 LAB — BASIC METABOLIC PANEL WITH GFR
Anion gap: 8 (ref 5–15)
BUN: 22 mg/dL (ref 8–23)
CO2: 29 mmol/L (ref 22–32)
Calcium: 8.4 mg/dL — ABNORMAL LOW (ref 8.9–10.3)
Chloride: 95 mmol/L — ABNORMAL LOW (ref 98–111)
Creatinine, Ser: 0.79 mg/dL (ref 0.61–1.24)
GFR, Estimated: >60 mL/min (ref >60)
Glucose, Bld: 85 mg/dL (ref 70–99)
Potassium: 3.5 mmol/L (ref 3.5–5.1)
Sodium: 132 mmol/L — ABNORMAL LOW (ref 135–145)

## 2024-01-12 LAB — CULTURE, BLOOD (ROUTINE X 2)
Culture: NO GROWTH
Culture: NO GROWTH
Special Requests: ADEQUATE
Special Requests: ADEQUATE

## 2024-01-12 SURGERY — EGD (ESOPHAGOGASTRODUODENOSCOPY)
Anesthesia: General

## 2024-01-12 MED ORDER — FENTANYL CITRATE (PF) 100 MCG/2ML IJ SOLN
INTRAMUSCULAR | Status: DC | PRN
  Administered 2024-01-12 (×2): 50 ug via INTRAVENOUS

## 2024-01-12 MED ORDER — DILTIAZEM LOAD VIA INFUSION: 15.0000 mg | Freq: Once | INTRAVENOUS | Status: DC

## 2024-01-12 MED ORDER — LIDOCAINE HCL (PF) 2 % IJ SOLN
INTRAMUSCULAR | Status: AC
  Filled 2024-01-12: qty 5

## 2024-01-12 MED ORDER — PROPOFOL 1000 MG/100ML IV EMUL
INTRAVENOUS | Status: AC
  Filled 2024-01-12: qty 100

## 2024-01-12 MED ORDER — PROPOFOL 10 MG/ML IV BOLUS
INTRAVENOUS | Status: AC
  Filled 2024-01-12: qty 20

## 2024-01-12 MED ORDER — DILTIAZEM HCL 25 MG/5ML IV SOLN
15.0000 mg | Freq: Once | INTRAVENOUS | Status: AC
  Administered 2024-01-12: 15 mg via INTRAVENOUS
  Filled 2024-01-12: qty 5

## 2024-01-12 MED ORDER — FENTANYL CITRATE (PF) 100 MCG/2ML IJ SOLN
INTRAMUSCULAR | Status: AC
  Filled 2024-01-12: qty 2

## 2024-01-12 MED ORDER — PROPOFOL 10 MG/ML IV BOLUS
INTRAVENOUS | Status: DC | PRN
  Administered 2024-01-12: 30 mg via INTRAVENOUS
  Administered 2024-01-12: 70 mg via INTRAVENOUS

## 2024-01-12 MED ORDER — SODIUM CHLORIDE 0.9 % IV SOLN: INTRAVENOUS | Status: AC | PRN

## 2024-01-12 NOTE — Progress Notes (Signed)
 PT attempt. Pt is off floor for EGD. Acute PT will continue to follow and progress per current POC . Will return when pt is available to participate.

## 2024-01-12 NOTE — Transfer of Care (Signed)
 Immediate Anesthesia Transfer of Care Note  Patient: John Booker  Procedure(s) Performed: EGD (ESOPHAGOGASTRODUODENOSCOPY)  Patient Location: PACU  Anesthesia Type:General  Level of Consciousness: awake  Airway & Oxygen Therapy: Patient Spontanous Breathing  Post-op Assessment: Report given to RN and Post -op Vital signs reviewed and stable  Post vital signs: Reviewed and stable  Last Vitals:  Vitals Value Taken Time  BP    Temp    Pulse    Resp    SpO2      Last Pain:  Vitals:   01/12/24 1037  TempSrc: Temporal  PainSc:       Patients Stated Pain Goal: 0 (01/09/24 1424)  Complications: No notable events documented.

## 2024-01-12 NOTE — Anesthesia Postprocedure Evaluation (Signed)
 Anesthesia Post Note  Patient: John Booker  Procedure(s) Performed: EGD (ESOPHAGOGASTRODUODENOSCOPY) BALLOON DILATION  Patient location during evaluation: PACU Anesthesia Type: General Level of consciousness: awake and alert Pain management: pain level controlled Vital Signs Assessment: post-procedure vital signs reviewed and stable Respiratory status: spontaneous breathing, nonlabored ventilation, respiratory function stable and patient connected to nasal cannula oxygen Cardiovascular status: blood pressure returned to baseline and stable Postop Assessment: no apparent nausea or vomiting Anesthetic complications: no   No notable events documented.   Last Vitals:  Vitals:   01/12/24 1156 01/12/24 1241  BP: (!) 145/76 (!) 165/82  Pulse: 90 85  Resp: 11 18  Temp:    SpO2: 100% 100%    Last Pain:  Vitals:   01/12/24 1156  TempSrc:   PainSc: (P) 0-No pain                 Lynwood KANDICE Clause

## 2024-01-12 NOTE — TOC Initial Note (Signed)
 Transition of Care [facility]) - Initial/Assessment Note    Patient Details  Name: John Booker MRN: [id number] Date of Birth: [birth year]  Transition of Care [facility]) CM/SW Contact:    Lauraine JAYSON Carpen, LCSW Phone Number: 01/12/2024, 1:15 PM  Clinical Narrative:  CSW met with patient. Daughter and male family member at bedside. CSW introduced role and explained that discharge planning would be discussed. Patient declined SNF placement but agreed to home health. He stated he will have a procedure in a few days. Will wait to send out home health referral in case he wound care needs change post-procedure. Patient has a RW, shower chair, and grab bar in the shower at home. No further concerns. CSW will continue to follow patient and his daughter for support and facilitate return home once stable. Daughter will transport at discharge.  Expected Discharge Plan: Home w Home Health Services Barriers to Discharge: Continued Medical Work up   Patient Goals and CMS Choice            Expected Discharge Plan and Services     Post Acute Care Choice: Home Health Living arrangements for the past 2 months: Single Family Home                                      Prior Living Arrangements/Services Living arrangements for the past 2 months: Single Family Home Lives with:: Self Patient language and need for interpreter reviewed:: Yes Do you feel safe going back to the place where you live?: Yes      Need for Family Participation in Patient Care: Yes (Comment)   Current home services: DME Criminal Activity/Legal Involvement Pertinent to Current Situation/Hospitalization: No - Comment as needed  Activities of Daily Living   ADL Screening (condition at time of admission) Independently performs ADLs?: Yes (appropriate for developmental age) Is the patient deaf or have difficulty hearing?: No Does the patient have difficulty seeing, even when wearing glasses/contacts?: No Does the patient have  difficulty concentrating, remembering, or making decisions?: No  Permission Sought/Granted Permission sought to share information with : Facility Medical Sales Representative, Family Supports Permission granted to share information with : Yes, Verbal Permission Granted  Share Information with NAME: [name] Le  Permission granted to share info w AGENCY: Home Health Agencies  Permission granted to share info w Relationship: Daughter  Permission granted to share info w Contact Information: [phone]  Emotional Assessment Appearance:: Appears stated age Attitude/Demeanor/Rapport: Engaged   Orientation: : Oriented to Self, Oriented to Place, Oriented to  Time, Oriented to Situation Alcohol / Substance Use: Not Applicable Psych Involvement: No (comment)  Admission diagnosis:  Anasarca [R60.1] Peripheral edema [R60.0] Cellulitis of left lower extremity [L03.116] Patient Active Problem List   Diagnosis Date Noted   Stricture of esophagus 01/12/2024   Esophageal dysphagia 01/11/2024   Cellulitis of left lower extremity 01/08/2024   Anasarca 01/07/2024   Acquired achalasia of esophagus - s/p esophageal dilatation 07-14-2023 07/15/2023   DNR (do not resuscitate)/DNI(Do NOT Intubate) 07/15/2023   Hypophosphatemia 07/12/2023   Hypomagnesemia 07/12/2023   Weakness 12/13/2022   Serum total bilirubin elevated 12/13/2022   Benign paraspinal mass 11/20/2021   GERD without esophagitis 11/16/2021   Hypokalemia 11/16/2021   Hyponatremia 11/16/2021   AKI (acute kidney injury) 11/15/2021   Stage 3a chronic kidney disease (HCC) 11/07/2021   Protrusion of cervical intervertebral disc 07/23/2021   Exertional dyspnea 07/04/2021   History  of alcohol abuse 07/04/2021   History of tobacco abuse 07/04/2021   Chronic diastolic CHF (congestive heart failure) (HCC) 06/26/2021   Headache 06/26/2021   Low serum albumin 05/10/2021   Malnutrition of moderate degree 12/28/2020   Esophageal dysmotility  12/28/2020   Generalized weakness 12/26/2020   Overweight (BMI 25.0-29.9) 12/20/2019   Neuropathy 04/04/2019   Pre-ulcerative calluses 10/07/2018   Skin lesion 10/07/2018   Plantar flexed metatarsal bone of right foot 10/07/2018   Hiatal hernia    Peripheral edema 05/08/2017   Alcohol abuse 03/06/2017   Alcohol dependence in sustained full remission (HCC) 03/05/2017   Essential hypertension 11/15/2015   Vitamin D  deficiency 11/15/2015   PCP:  Alla Amis, MD Pharmacy:   [facility] [place], [place] - [address] [place] [address] Phone: [phone] Fax: [phone]  [facility] [address] [place] - [address] [place] [address] Phone: [phone] Fax: [phone]     Social Drivers of Health (SDOH) Social History: SDOH Screenings   Food Insecurity: No Food Insecurity (01/07/2024)  Housing: Low Risk  (01/07/2024)  Transportation Needs: No Transportation Needs (01/07/2024)  Utilities: Not At Risk (01/07/2024)  Depression (PHQ2-9): Low Risk  (12/12/2021)  Financial Resource Strain: Low Risk  (05/07/2023)   Received from [facility]  Social Connections: Socially Isolated (01/07/2024)  Tobacco Use: Medium Risk (01/12/2024)   SDOH Interventions:     Readmission Risk Interventions    07/13/2023   12:44 PM  Readmission Risk Prevention Plan  Transportation Screening Complete  PCP or Specialist Appt within 5-7 Days Complete  Medication Review (RN CM) Complete

## 2024-01-12 NOTE — Progress Notes (Signed)
 [facility]  - [place] at [facility]   PATIENT NAME: John Booker    MR#:  [id number]  DATE OF BIRTH:  29-Apr-1950  SUBJECTIVE:  Came in with increasing swelling of both lower extremities, weeping edema and lower extremity right third and fifth digit gangrene along with ulcers on the TBL [place] and ankle For EGD today  VITALS:  Blood pressure (!) 145/76, pulse 90, temperature (!) 96.7 F (35.9 C), temperature source Temporal, resp. rate 11, height 6' 3 (1.905 m), weight 74.9 kg, SpO2 100%.  PHYSICAL EXAMINATION:   GENERAL:  73 y.o.-year-old patient with no acute distress.  LUNGS: Normal breath sounds bilaterally, no wheezing CARDIOVASCULAR: S1, S2 normal. No murmur   ABDOMEN: Soft, nontender, nondistended. Bowel sounds present.  EXTREMITIES:  NEUROLOGIC: nonfocal  patient is alert and awake   LABORATORY PANEL:  CBC Recent Labs  Lab 01/08/24 0405  WBC 6.5  HGB 10.7*  HCT 32.8*  PLT 230    Chemistries  Recent Labs  Lab 01/07/24 1043 01/08/24 0405 01/10/24 1651 01/11/24 0347 01/12/24 0355  NA 134*   < > 136   < > 132*  K 4.5   < > 4.0   < > 3.5  CL 103   < > 92*   < > 95*  CO2 20*   < > 33*   < > 29  GLUCOSE 93   < > 170*   < > 85  BUN 19   < > 26*   < > 22  CREATININE 1.29*   < > 1.34*   < > 0.79  CALCIUM 8.4*   < > 8.2*   < > 8.4*  MG 1.6*   < > 1.8  --   --   AST 22  --   --   --   --   ALT 16  --   --   --   --   ALKPHOS 79  --   --   --   --   BILITOT 1.0  --   --   --   --    < > = values in this interval not displayed.   Cardiac Enzymes No results for input(s): TROPONINI in the last 168 hours. RADIOLOGY:  PERIPHERAL VASCULAR CATHETERIZATION Result Date: 01/11/2024 See surgical note for result.   Assessment and Plan  John Booker is a 73 y.o. male with Past medical history of HTN, HFpEF, CKD stage II, lymphedema of bilateral lower extremities, esophageal stricture s/p dilatation, chronic hiccups as reviewed from EMR,  presented at [facility] ED with complaining of bilateral lower extremity and upper extremity edema which is gradually getting worse.  Patient is not a very good historian, as per patient's daughter lower extremity edema is gradually getting worse for the past 1 week and it is weeping.  There is an area of erythema in the left lower extremity on the lateral side which he scratched maybe had trauma.   # Anasarca\Hypoalbuminemia BNP 106.9 slightly elevated --Started Lasix  IV infusion --diuresed well. D/c lasix  gtt --renal function stable   # Cellulitis of bilateral lower extremities  # Ischemic right first toe ulcer and ischemia of third toe possible underlying PAD Clinically looks erythematous and tender WBC within normal range Empirically started antibiotics ceftriaxone  and vancomycin  Pharmacy consulted for Vanco dosing and trough monitoring De-escalate as per improvement --Vascular plans angiogram on Monday eper Dr Marea --11/3--underwent Diagnostic showed significant bilateral PAD. Dr Marea to reschedule for intervention  #  History of esophageal stricture and dilatation, chronic hiccups --Continue PPI twice daily --Thorazine  12.5 mg IV x 1 dose given for hiccups --d/w GI dr Anna--pt does not feel EGD with dilatation helped before.  --Ba swallow shows HIGH grade stricture--will need EGD with dilatation. --Dr Jinny aware and trying to find the best time to get EGD done since angiogram will require Heparin  unless done diagnostic for now and intervention later to avoind any complactions with Esophageal dilatation. --11/4--Underwent EGD with Dilatation--ok to resume pureed diet per GI   # EtOH abuse --As per patient he quit several years ago. --Patient's daughter is concerned that he is also drinking alcohol but she is not aware how much.  -- lives alone. --no s/s of WD --cont multivitamin, thiamine  and folic acid .   # AKI on CKD stage II Baseline sCr 0.81, eGFR >60 Scr 1.29 on admission --creat  stable at 1.35 --good uop with IV lasix --now d/ced   # Hypomagnesemia, mag repleted   # Iron deficiency, started oral iron supplement with vitamin C    # HTN, HFpEF --on losartan  and metoprolol  home dose --Prior echo: LVEF 60 to 65%, moderate LVH --cont BB and hydralazine  --Ace on hold due to creat    PT/OT recs rehab--TOC for d/c planning    Procedures: Family communication :dter Consults : podiatry, vascular surgery,GI CODE STATUS: DNR DVT Prophylaxis : Lovenox  Level of care: Progressive Status is: Inpatient Remains inpatient appropriate because: bilateral lower extremity edema, cellulitis, severe dysphagia    TOTAL TIME TAKING CARE OF THIS PATIENT:35 minutes.  >50% time spent on counselling and coordination of care  Note: This dictation was prepared with Dragon dictation along with smaller phrase technology. Any transcriptional errors that result from this process are unintentional.  Leita Blanch M.D    [facility]   CC: Primary care physician; Alla Amis, MD

## 2024-01-12 NOTE — Op Note (Signed)
 [facility] Gastroenterology Patient Name: John Booker Procedure Date: 01/12/2024 11:21 AM MRN: [id number] Account #: [id number] Date of Birth: 11/01/1950 Admit Type: Inpatient Age: 73 Room: [facility] ENDO ROOM 4 Gender: Male Note Status: Finalized Instrument Name: Endoscope [id number] Procedure:             Upper GI endoscopy Indications:           Dysphagia Providers:             [name] MD, MD Medicines:             Propofol  per Anesthesia Complications:         No immediate complications. Procedure:             Pre-Anesthesia Assessment:                        - Prior to the procedure, a History and Physical was                         performed, and patient medications and allergies were                         reviewed. The patient's tolerance of previous                         anesthesia was also reviewed. The risks and benefits                         of the procedure and the sedation options and risks                         were discussed with the patient. All questions were                         answered, and informed consent was obtained. Prior                         Anticoagulants: The patient has taken no anticoagulant                         or antiplatelet agents. ASA Grade Assessment: II - A                         patient with mild systemic disease. After reviewing                         the risks and benefits, the patient was deemed in                         satisfactory condition to undergo the procedure.                        After obtaining informed consent, the endoscope was                         passed under direct vision. Throughout the procedure,                         the patient's blood pressure, pulse,  and oxygen                         saturations were monitored continuously. The Endoscope                         was introduced through the mouth, and advanced to the                         second part of duodenum. The upper GI  endoscopy was                         accomplished without difficulty. The patient tolerated                         the procedure well. Findings:      One benign-appearing, intrinsic mild stenosis was found at the       gastroesophageal junction. The stenosis was traversed. A TTS dilator was       passed through the scope. Dilation with a 15-16.5-18 mm balloon dilator       was performed to 16.5 mm. The dilation site was examined following       endoscope reinsertion and showed moderate improvement in luminal       narrowing.      The stomach was normal.      The examined duodenum was normal. Impression:            - Benign-appearing esophageal stenosis. Dilated.                        - Normal stomach.                        - Normal examined duodenum.                        - No specimens collected. Recommendation:        - Return patient to hospital ward for ongoing care.                        - Resume previous diet.                        - Continue present medications.                        - If dysphagia presists then the patient will need                         manometry for possible achelesia Procedure Code(s):     --- Professional ---                        [phone], Esophagogastroduodenoscopy, flexible,                         transoral; with transendoscopic balloon dilation of                         esophagus (less than 30 mm diameter) Diagnosis Code(s):     --- Professional ---  R13.10, Dysphagia, unspecified                        K22.2, Esophageal obstruction CPT copyright 2022 American Medical Association. All rights reserved. The codes documented in this report are preliminary and upon coder review may  be revised to meet current compliance requirements. [name] MD, MD 01/12/2024 11:43:50 AM This report has been signed electronically. Number of Addenda: 0 Note Initiated On: 01/12/2024 11:21 AM Estimated Blood Loss:  Estimated blood loss:  none.      [facility]

## 2024-01-12 NOTE — Progress Notes (Signed)
 The patient had an upper endoscopy today without any significant findings found in the stomach or duodenum and there was a mild stricture seen at the distal esophagus that was treated with balloon dilation up to 16.5 mm.  The patient's abnormal barium swallow may indicate that the patient is having achalasia opposed to a stricture.  There was clearly a mild stricture seen and dilated and the patient should feel better.  If he does not then outpatient workup with a esophageal manometry may be needed in the future.  Nothing further to do from a GI point of view during this hospital stay.  I will sign off.  Please call if any further GI concerns or questions.  We would like to thank you for the opportunity to participate in the care of John Booker.

## 2024-01-12 NOTE — Anesthesia Preprocedure Evaluation (Signed)
 Anesthesia Evaluation  Patient identified by MRN, date of birth, ID band Patient awake    Reviewed: Allergy & Precautions, H&P , NPO status , Patient's Chart, lab work & pertinent test results, reviewed documented beta blocker date and time   Airway Mallampati: II   Neck ROM: full    Dental  (+) Poor Dentition   Pulmonary neg pulmonary ROS, former smoker   Pulmonary exam normal        Cardiovascular Exercise Tolerance: Poor hypertension, On Medications +CHF and + DOE  Normal cardiovascular exam Rhythm:regular Rate:Normal     Neuro/Psych  Headaches PSYCHIATRIC DISORDERS       Neuromuscular disease    GI/Hepatic Neg liver ROS, hiatal hernia,GERD  Medicated,,  Endo/Other  negative endocrine ROS    Renal/GU Renal disease  negative genitourinary   Musculoskeletal   Abdominal   Peds  Hematology negative hematology ROS (+)   Anesthesia Other Findings Past Medical History: No date: (HFpEF) heart failure with preserved ejection fraction (HCC)     Comment:  a.) TTE 04/11/2021: EF 60-65%, no RWMAs, G1DD, mild LAE,              RVSF norm, asc Ao 38 mm; b.) TTE 11/18/2021: EF 60-65%,               no RWMAs, mild LVH, G1DD, norm RVSH; c.) TEE 11/20/2021:               EF 60-65%, no RWMAs, no LAA thrombus, triv MR, no               valvular vegetation, no IAS; d.) TTE 11/24/2022: EF               60-65%, no RWMAs, mod LVH, norm RVSF No date: Alcohol abuse, in remission No date: Aortic atherosclerosis No date: Bacteremia due to Gram-negative bacteria (Klebsiella oxytoca)     Comment:  a.) source unidentified; b.) admitted to [facility] 11/20/2021              - 12/28/2021 No date: Bilateral lower extremity edema No date: BPH (benign prostatic hyperplasia) No date: Cholelithiasis No date: Chronic hiccups No date: CKD (chronic kidney disease), stage III (HCC) No date: DOE (dyspnea on exertion) No date: Esophageal dysmotility No  date: GERD (gastroesophageal reflux disease) No date: Hemorrhoids No date: Hepatic cyst 2024: History of bilateral cataract extraction No date: History of hiatal hernia 02/18/2022: History of MSSA bacteremia secondary to foot wounds, s/p  treatment 9/13 - 12/28/21 No date: Hypertension No date: Mediastinal mass No date: Memory changes No date: Metabolic encephalopathy     Comment:  a.) in the setting of bacteremia/sepsis 11/2021: MSSA bacteremia     Comment:  a.) source was foot wounds and probable septic arthritis              in the lumbar spine No date: Nephrolithiasis No date: Neuropathy No date: Non-traumatic rhabdomyolysis No date: Paraspinal mass No date: Renal cyst, right No date: Sigmoid diverticulosis Past Surgical History: 02/16/2023: BRONCHIAL WASHINGS; N/A     Comment:  Procedure: BRONCHIAL WASHINGS;  Surgeon: Parris Manna, MD;  Location: [facility] ORS;  Service: Thoracic;                Laterality: N/A; 11/13/2022: CATARACT EXTRACTION W/PHACO; Left     Comment:  Procedure: CATARACT EXTRACTION PHACO AND INTRAOCULAR  LENS PLACEMENT (IOC) LEFT OMIDRIA    13.58  01:23.3;                Surgeon: Enola Feliciano Hugger, MD;  Location: [facility]               SURGERY CNTR;  Service: Ophthalmology;  Laterality: Left; 01/08/2023: CATARACT EXTRACTION W/PHACO; Right     Comment:  Procedure: CATARACT EXTRACTION PHACO AND INTRAOCULAR               LENS PLACEMENT (IOC) RIGHT OMIDRIA  14.23 01:16.5;                Surgeon: Enola Feliciano Hugger, MD;  Location: [facility]               SURGERY CNTR;  Service: Ophthalmology;  Laterality:               Right; 07/14/2023: ESOPHAGOGASTRODUODENOSCOPY; N/A     Comment:  Procedure: EGD (ESOPHAGOGASTRODUODENOSCOPY);  Surgeon:               Maryruth Ole DASEN, MD;  Location: [facility] ENDOSCOPY;                Service: Endoscopy;  Laterality: N/A; 05/26/2018: ESOPHAGOGASTRODUODENOSCOPY (EGD) WITH PROPOFOL ; N/A     Comment:   Procedure: ESOPHAGOGASTRODUODENOSCOPY (EGD) WITH               PROPOFOL ;  Surgeon: Janalyn Keene NOVAK, MD;  Location:               [facility] ENDOSCOPY;  Service: Endoscopy;  Laterality: N/A; 12/28/2020: ESOPHAGOGASTRODUODENOSCOPY (EGD) WITH PROPOFOL ; N/A     Comment:  Procedure: ESOPHAGOGASTRODUODENOSCOPY (EGD) WITH               PROPOFOL ;  Surgeon: Onita Elspeth Sharper, DO;  Location:              [facility] ENDOSCOPY;  Service: Gastroenterology;  Laterality:               N/A; 10/09/2021: ESOPHAGOGASTRODUODENOSCOPY (EGD) WITH PROPOFOL ; N/A     Comment:  Procedure: ESOPHAGOGASTRODUODENOSCOPY (EGD) WITH               PROPOFOL ;  Surgeon: Therisa Bi, MD;  Location: [facility]               ENDOSCOPY;  Service: Gastroenterology;  Laterality: N/A; 02/16/2023: FLEXIBLE BRONCHOSCOPY; N/A     Comment:  Procedure: FLEXIBLE BRONCHOSCOPY;  Surgeon: Parris Manna, MD;  Location: [facility] ORS;  Service: Thoracic;                Laterality: N/A; 11/19/2015: INCISION AND DRAINAGE PERIRECTAL ABSCESS; N/A     Comment:  Procedure: IRRIGATION AND DEBRIDEMENT PERIRECTAL ABSCESS              and debridement of scrotal abscess;  Surgeon: Charlie FORBES Fell, MD;  Location: [facility] ORS;  Service: General;                Laterality: N/A; 11/20/2015: INCISION AND DRAINAGE PERIRECTAL ABSCESS; N/A     Comment:  Procedure: IRRIGATION AND DEBRIDEMENT PERIRECTAL ABSCESS              / WITH DRESSING CHANGE;  Surgeon: Charlie FORBES Fell, MD;                Location: [facility]  ORS;  Service: General;  Laterality: N/A;               peri rectal site 11/21/2015: INCISION AND DRAINAGE PERIRECTAL ABSCESS; N/A     Comment:  Procedure: IRRIGATION AND DEBRIDEMENT PERIRECTAL ABSCESS              WITH DRESSING CHANGE;  Surgeon: Charlie FORBES Fell, MD;                Location: [facility] ORS;  Service: General;  Laterality: N/A; 01/11/2024: LOWER EXTREMITY ANGIOGRAPHY; Right     Comment:  Procedure: Lower Extremity Angiography;  Surgeon:  Marea Selinda RAMAN, MD;  Location: [facility] INVASIVE CV LAB;  Service:               Cardiovascular;  Laterality: Right; 1989: MINOR HEMORRHOIDECTOMY 11/20/2021: TEE WITHOUT CARDIOVERSION; N/A     Comment:  Procedure: TRANSESOPHAGEAL ECHOCARDIOGRAM (TEE);                Surgeon: Hester Wolm PARAS, MD;  Location: [facility] ORS;                Service: Cardiovascular;  Laterality: N/A;  8:00am BMI    Body Mass Index: 20.64 kg/m     Reproductive/Obstetrics negative OB ROS                              Anesthesia Physical Anesthesia Plan  ASA: 3  Anesthesia Plan: General   Post-op Pain Management:    Induction:   PONV Risk Score and Plan:   Airway Management Planned:   Additional Equipment:   Intra-op Plan:   Post-operative Plan:   Informed Consent: I have reviewed the patients History and Physical, chart, labs and discussed the procedure including the risks, benefits and alternatives for the proposed anesthesia with the patient or authorized representative who has indicated his/her understanding and acceptance.     Dental Advisory Given  Plan Discussed with: CRNA  Anesthesia Plan Comments:         Anesthesia Quick Evaluation

## 2024-01-12 NOTE — Progress Notes (Signed)
 OT Cancellation Note  Patient Details Name: John Booker MRN: [id number] DOB: 1950-06-05   Cancelled Treatment:    Reason Eval/Treat Not Completed: Patient at procedure or test/ unavailable. Pt out of the room. Per chart, scheduled for EGD today. Will re-attempt at later date/time as pt is available and medically appropriate.   Abrham Maslowski R., MPH, MS, OTR/L ascom [phone] 01/12/24, 10:50 AM

## 2024-01-13 DIAGNOSIS — L03116 Cellulitis of left lower limb: Secondary | ICD-10-CM | POA: Diagnosis not present

## 2024-01-13 DIAGNOSIS — R601 Generalized edema: Secondary | ICD-10-CM | POA: Diagnosis not present

## 2024-01-13 DIAGNOSIS — I96 Gangrene, not elsewhere classified: Secondary | ICD-10-CM | POA: Diagnosis not present

## 2024-01-13 DIAGNOSIS — R1319 Other dysphagia: Secondary | ICD-10-CM

## 2024-01-13 DIAGNOSIS — E871 Hypo-osmolality and hyponatremia: Secondary | ICD-10-CM

## 2024-01-13 DIAGNOSIS — N179 Acute kidney failure, unspecified: Secondary | ICD-10-CM

## 2024-01-13 DIAGNOSIS — E876 Hypokalemia: Secondary | ICD-10-CM

## 2024-01-13 MED ORDER — AMOXICILLIN-POT CLAVULANATE 875-125 MG PO TABS
1.0000 | ORAL_TABLET | Freq: Two times a day (BID) | ORAL | Status: DC
  Administered 2024-01-13 – 2024-01-16 (×6): 1 via ORAL
  Filled 2024-01-13 (×7): qty 1

## 2024-01-13 MED ORDER — ENSURE PLUS HIGH PROTEIN PO LIQD
237.0000 mL | Freq: Two times a day (BID) | ORAL | Status: DC
  Administered 2024-01-13: 237 mL via ORAL

## 2024-01-13 NOTE — Assessment & Plan Note (Signed)
 Dilated on 11/4.  Continue PPI.

## 2024-01-13 NOTE — Progress Notes (Signed)
 Physical Therapy Treatment Patient Details Name: John Booker MRN: [id number] DOB: 01-Apr-1950 Today's Date: 01/13/2024   History of Present Illness Pt is a 73 y/o M admitted on 01/07/24 following a fall, c/o worsening BLE & BUE edema. Pt is being treated for anasarca, BLE cellulitis, ischemic R 1st toe ulcer & ischemia of 3rd toe. PMH: HTN, HFpEF, CKD 2, lymphedema of BLE, chronic hiccups, EtOH abuse    PT Comments  Pt seen for PT tx with pt received in bed, easily irritated but agreeable to tx. PT provides extensive education re: participation in therapy & encouragement. Pt is able to complete bed mobility with supervision, transfers with min assist & RW, & even progresses to ambulating short distance with RW. Pt does c/o fatigue after session but PT educated him on improvement! Recommend ongoing PT services to progress mobility as able.    If plan is discharge home, recommend the following: A little help with walking and/or transfers;A lot of help with bathing/dressing/bathroom;Assistance with cooking/housework;Assist for transportation;Help with stairs or ramp for entrance   Can travel by private vehicle     Yes  Equipment Recommendations  Rolling walker (2 wheels);BSC/3in1    Recommendations for Other Services       Precautions / Restrictions Precautions Precautions: Fall Recall of Precautions/Restrictions: Intact Restrictions Other Position/Activity Restrictions: per secure chat with MDs, pt can WBAT to bathroom     Mobility  Bed Mobility Overal bed mobility: Needs Assistance Bed Mobility: Supine to Sit, Sit to Supine     Supine to sit: Supervision, HOB elevated, Used rails Sit to supine: Supervision, HOB elevated, Used rails        Transfers Overall transfer level: Needs assistance Equipment used: Rolling walker (2 wheels) Transfers: Sit to/from Stand, Bed to chair/wheelchair/BSC Sit to Stand: Min assist   Step pivot transfers: Min assist       General  transfer comment: sit>stand from EOB, BSC with cuing re: hand placement, assistance to power up, bed<>BSC via step pivot with RW & min assist    Ambulation/Gait Ambulation/Gait assistance: Min assist Gait Distance (Feet): 7 Feet (to end of bed & back) Assistive device: Rolling walker (2 wheels) Gait Pattern/deviations: Decreased step length - right, Decreased step length - left, Decreased stride length, Decreased dorsiflexion - right, Decreased dorsiflexion - left, Shuffle Gait velocity: decreased     General Gait Details: education/cuing to ambulate within base of AD   Stairs             Wheelchair Mobility     Tilt Bed    Modified Rankin (Stroke Patients Only)       Balance Overall balance assessment: Needs assistance Sitting-balance support: Feet supported Sitting balance-Leahy Scale: Good     Standing balance support: During functional activity, Bilateral upper extremity supported, Reliant on assistive device for balance Standing balance-Leahy Scale: Fair                              Hotel Manager: Impaired Factors Affecting Communication: Hearing impaired  Cognition Arousal: Alert Behavior During Therapy:  (irritated)   PT - Cognitive impairments: No family/caregiver present to determine baseline                         Following commands: Impaired Following commands impaired: Follows one step commands with increased time, Follows multi-step commands with increased time    Cueing Cueing Techniques: Verbal cues, Gestural  cues, Tactile cues, Visual cues  Exercises      General Comments General comments (skin integrity, edema, etc.): Continent BM on BSC, dependent assistance for peri hygiene      Pertinent Vitals/Pain Pain Assessment Pain Assessment: Faces Faces Pain Scale: Hurts little more Pain Location: BLE Pain Descriptors / Indicators: Grimacing, Discomfort, Guarding Pain Intervention(s):  Monitored during session    Home Living                          Prior Function            PT Goals (current goals can now be found in the care plan section) Acute Rehab PT Goals Patient Stated Goal: decreased pain, get better PT Goal Formulation: With patient Time For Goal Achievement: 01/23/24 Potential to Achieve Goals: Fair Progress towards PT goals: Progressing toward goals    Frequency    Min 2X/week      PT Plan      Co-evaluation              AM-PAC PT 6 Clicks Mobility   Outcome Measure  Help needed turning from your back to your side while in a flat bed without using bedrails?: None Help needed moving from lying on your back to sitting on the side of a flat bed without using bedrails?: A Little Help needed moving to and from a bed to a chair (including a wheelchair)?: A Little Help needed standing up from a chair using your arms (e.g., wheelchair or bedside chair)?: A Little Help needed to walk in hospital room?: A Little Help needed climbing 3-5 steps with a railing? : Total 6 Click Score: 17    End of Session   Activity Tolerance: Patient tolerated treatment well Patient left: in bed;with call bell/phone within reach;with bed alarm set Nurse Communication: Mobility status PT Visit Diagnosis: Muscle weakness (generalized) (M62.81);Pain;Difficulty in walking, not elsewhere classified (R26.2) Pain - Right/Left:  (bilateral) Pain - part of body: Leg     Time: 1325-1405 PT Time Calculation (min) (ACUTE ONLY): 40 min  Charges:    $Therapeutic Activity: 38-52 mins PT General Charges $$ ACUTE PT VISIT: 1 Visit                     [name], PT, DPT 01/13/24, 2:15 PM   [name] 01/13/2024, 2:14 PM

## 2024-01-13 NOTE — Assessment & Plan Note (Addendum)
 Initially was started on Lasix  drip.  Was off diuretics for procedures.  Restarted low-dose Lasix  on 11/8.

## 2024-01-13 NOTE — H&P (View-Only) (Signed)
 Progress Note    01/13/2024 3:24 PM 1 Day Post-Op  Subjective:  John Booker is a 73 yo male who is POD #2 From:  Date of Surgery: 01/11/2024   Surgeon(s):DEW,JASON     Assistants:none   Pre-operative Diagnosis: PAD with ulceration and gangrenous changes right lower extremity   Post-operative diagnosis:  Same   Procedure(s) Performed:             1.  Ultrasound guidance for vascular access left femoral artery             2.  Catheter placement into right SFA from left femoral approach             3.  Aortogram and selective right lower extremity angiogram             4.  Celt closure device left femoral artery  Patient is resting comfortably in bed this afternoon.  He underwent an upper endoscopy earlier today.  They did an esophageal dilation due to esophageal stenosis.  No complications to note.  No bleeding postprocedure.  No complaints this afternoon.  Vitals all remained stable.   Vitals:   01/13/24 0916 01/13/24 1200  BP:  (!) 157/91  Pulse:    Resp: (!) 22 18  Temp:    SpO2:     Physical Exam: Cardiac:  RRR, normal S1 and S2.  No murmurs appreciated. Lungs: Normal nonlabored breathing, no rales rhonchi or wheezing noted. Incisions: Right groin with dressing clean dry and intact.  No hematoma seroma note. Extremities: Palpable pulses in the upper extremities.  Unable to palpate pulses in the lower extremities due to +4 edema and swelling. Abdomen: Positive bowel sounds throughout, soft, nontender and nondistended. Neurologic: Alert and oriented x 3.  Answers all questions and follows commands appropriately  CBC    Component Value Date/Time   WBC 6.5 01/08/2024 0405   RBC 3.32 (L) 01/08/2024 0405   HGB 10.7 (L) 01/08/2024 0405   HGB 15.7 04/15/2012 2049   HCT 32.8 (L) 01/08/2024 0405   HCT 47.7 04/15/2012 2049   PLT 230 01/08/2024 0405   PLT 175 04/15/2012 2049   MCV 98.8 01/08/2024 0405   MCV 92 04/15/2012 2049   MCH 32.2 01/08/2024 0405   MCHC 32.6  01/08/2024 0405   RDW 12.5 01/08/2024 0405   RDW 13.9 04/15/2012 2049   LYMPHSABS 0.5 (L) 01/07/2024 1043   MONOABS 0.4 01/07/2024 1043   EOSABS 0.0 01/07/2024 1043   BASOSABS 0.0 01/07/2024 1043    BMET    Component Value Date/Time   NA 132 (L) 01/12/2024 0355   NA 135 (L) 04/15/2012 2049   K 3.5 01/12/2024 0355   K 3.5 04/15/2012 2049   CL 95 (L) 01/12/2024 0355   CL 98 04/15/2012 2049   CO2 29 01/12/2024 0355   CO2 30 04/15/2012 2049   GLUCOSE 85 01/12/2024 0355   GLUCOSE 107 (H) 04/15/2012 2049   BUN 22 01/12/2024 0355   BUN 17 04/15/2012 2049   CREATININE 0.79 01/12/2024 0355   CREATININE 1.15 12/20/2020 0946   CALCIUM 8.4 (L) 01/12/2024 0355   CALCIUM 8.5 04/15/2012 2049   GFRNONAA >60 01/12/2024 0355   GFRNONAA 73 12/20/2019 0916   GFRAA 85 12/20/2019 0916    INR    Component Value Date/Time   INR 1.1 12/13/2022 1514     Intake/Output Summary (Last 24 hours) at 01/13/2024 1524 Last data filed at 01/13/2024 0441 Gross per 24 hour  Intake 480  ml  Output 200 ml  Net 280 ml     Assessment/Plan:  73 y.o. male is s/p SEE ABOVE 1 Day Post-Op   PLAN Vascular surgery plans on taking the patient to the vascular lab tomorrow on 01/14/2024 for right lower extremity angiogram with possible intervention.  I discussed in detail with the patient again the procedure, benefits, risk, and complications.  He verbalizes understanding and wishes to proceed.  Answered all his questions this afternoon.  Patient will be made n.p.o. after midnight tonight for procedure tomorrow.  Patient is currently not on any anticoagulation.  DVT prophylaxis:  NONE   [name] R [name] [facility] 01/13/2024 3:24 PM

## 2024-01-13 NOTE — Evaluation (Addendum)
 Clinical/Bedside Swallow Evaluation Patient Details  Name: John Booker MRN: [id number] Date of Birth: [birth year]  Today's Date: 01/13/2024 Time: SLP Start Time (ACUTE ONLY): 1055 SLP Stop Time (ACUTE ONLY): 1145 SLP Time Calculation (min) (ACUTE ONLY): 50 min  Past Medical History:  Past Medical History:  Diagnosis Date   (HFpEF) heart failure with preserved ejection fraction (HCC)    a.) TTE 04/11/2021: EF 60-65%, no RWMAs, G1DD, mild LAE, RVSF norm, asc Ao 38 mm; b.) TTE 11/18/2021: EF 60-65%, no RWMAs, mild LVH, G1DD, norm RVSH; c.) TEE 11/20/2021: EF 60-65%, no RWMAs, no LAA thrombus, triv MR, no valvular vegetation, no IAS; d.) TTE 11/24/2022: EF 60-65%, no RWMAs, mod LVH, norm RVSF   Alcohol abuse, in remission    Aortic atherosclerosis    Bacteremia due to Gram-negative bacteria (Klebsiella oxytoca)    a.) source unidentified; b.) admitted to [facility] 11/20/2021 - 12/28/2021   Bilateral lower extremity edema    BPH (benign prostatic hyperplasia)    Cholelithiasis    Chronic hiccups    CKD (chronic kidney disease), stage III (HCC)    DOE (dyspnea on exertion)    Esophageal dysmotility    GERD (gastroesophageal reflux disease)    Hemorrhoids    Hepatic cyst    History of bilateral cataract extraction 2024   History of hiatal hernia    History of MSSA bacteremia secondary to foot wounds, s/p treatment 9/13 - 12/28/21 02/18/2022   Hypertension    Mediastinal mass    Memory changes    Metabolic encephalopathy    a.) in the setting of bacteremia/sepsis   MSSA bacteremia 11/2021   a.) source was foot wounds and probable septic arthritis in the lumbar spine   Nephrolithiasis    Neuropathy    Non-traumatic rhabdomyolysis    Paraspinal mass    Renal cyst, right    Sigmoid diverticulosis    Past Surgical History:  Past Surgical History:  Procedure Laterality Date   BALLOON DILATION  01/12/2024   Procedure: BALLOON DILATION;  Surgeon: Jinny Carmine, MD;  Location: [facility]  ENDOSCOPY;  Service: Endoscopy;;   BRONCHIAL WASHINGS N/A 02/16/2023   Procedure: BRONCHIAL WASHINGS;  Surgeon: Parris Manna, MD;  Location: [facility] ORS;  Service: Thoracic;  Laterality: N/A;   CATARACT EXTRACTION W/PHACO Left 11/13/2022   Procedure: CATARACT EXTRACTION PHACO AND INTRAOCULAR LENS PLACEMENT (IOC) LEFT OMIDRIA    13.58  01:23.3;  Surgeon: Enola Feliciano Hugger, MD;  Location: [facility] SURGERY [facility]TR;  Service: Ophthalmology;  Laterality: Left;   CATARACT EXTRACTION W/PHACO Right 01/08/2023   Procedure: CATARACT EXTRACTION PHACO AND INTRAOCULAR LENS PLACEMENT (IOC) RIGHT OMIDRIA  14.23 01:16.5;  Surgeon: Enola Feliciano Hugger, MD;  Location: [facility] SURGERY [facility]TR;  Service: Ophthalmology;  Laterality: Right;   ESOPHAGOGASTRODUODENOSCOPY N/A 07/14/2023   Procedure: EGD (ESOPHAGOGASTRODUODENOSCOPY);  Surgeon: Maryruth Ole DASEN, MD;  Location: [facility] ENDOSCOPY;  Service: Endoscopy;  Laterality: N/A;   ESOPHAGOGASTRODUODENOSCOPY N/A 01/12/2024   Procedure: EGD (ESOPHAGOGASTRODUODENOSCOPY);  Surgeon: Jinny Carmine, MD;  Location: [facility] ENDOSCOPY;  Service: Endoscopy;  Laterality: N/A;   ESOPHAGOGASTRODUODENOSCOPY (EGD) WITH PROPOFOL  N/A 05/26/2018   Procedure: ESOPHAGOGASTRODUODENOSCOPY (EGD) WITH PROPOFOL ;  Surgeon: Janalyn Keene NOVAK, MD;  Location: [facility] ENDOSCOPY;  Service: Endoscopy;  Laterality: N/A;   ESOPHAGOGASTRODUODENOSCOPY (EGD) WITH PROPOFOL  N/A 12/28/2020   Procedure: ESOPHAGOGASTRODUODENOSCOPY (EGD) WITH PROPOFOL ;  Surgeon: Onita Elspeth Sharper, DO;  Location: [facility] ENDOSCOPY;  Service: Gastroenterology;  Laterality: N/A;   ESOPHAGOGASTRODUODENOSCOPY (EGD) WITH PROPOFOL  N/A 10/09/2021   Procedure: ESOPHAGOGASTRODUODENOSCOPY (EGD) WITH PROPOFOL ;  Surgeon: Therisa Bi, MD;  Location: [facility] ENDOSCOPY;  Service: Gastroenterology;  Laterality: N/A;   FLEXIBLE BRONCHOSCOPY N/A 02/16/2023   Procedure: FLEXIBLE BRONCHOSCOPY;  Surgeon: Parris Manna, MD;  Location: [facility] ORS;  Service: Thoracic;   Laterality: N/A;   INCISION AND DRAINAGE PERIRECTAL ABSCESS N/A 11/19/2015   Procedure: IRRIGATION AND DEBRIDEMENT PERIRECTAL ABSCESS and debridement of scrotal abscess;  Surgeon: Charlie FORBES Fell, MD;  Location: [facility] ORS;  Service: General;  Laterality: N/A;   INCISION AND DRAINAGE PERIRECTAL ABSCESS N/A 11/20/2015   Procedure: IRRIGATION AND DEBRIDEMENT PERIRECTAL ABSCESS / WITH DRESSING CHANGE;  Surgeon: Charlie FORBES Fell, MD;  Location: [facility] ORS;  Service: General;  Laterality: N/A;  peri rectal site   INCISION AND DRAINAGE PERIRECTAL ABSCESS N/A 11/21/2015   Procedure: IRRIGATION AND DEBRIDEMENT PERIRECTAL ABSCESS WITH DRESSING CHANGE;  Surgeon: Charlie FORBES Fell, MD;  Location: [facility] ORS;  Service: General;  Laterality: N/A;   LOWER EXTREMITY ANGIOGRAPHY Right 01/11/2024   Procedure: Lower Extremity Angiography;  Surgeon: Marea Selinda RAMAN, MD;  Location: [facility] INVASIVE CV LAB;  Service: Cardiovascular;  Laterality: Right;   MINOR HEMORRHOIDECTOMY  1989   TEE WITHOUT CARDIOVERSION N/A 11/20/2021   Procedure: TRANSESOPHAGEAL ECHOCARDIOGRAM (TEE);  Surgeon: Hester Wolm PARAS, MD;  Location: [facility] ORS;  Service: Cardiovascular;  Laterality: N/A;  8:00am   HPI:  Pt is a 73 y.o. male has a history of hypertension heart failure CKD, bilateral lower extremity edema, Esophageal Dysmotility and Hiccups for 6 years per pt report, ETOH use in past, and history of Esophageal Stricture that has been dilated in the past.  Per MD note at admit, According to the patient and daughter for the last week or so he has had progressively worsening swelling of his upper and lower extremities.  Lower extremities are now diffusely weeping, which he states he has never had.  There is also an area of erythema to the lateral left lower extremity concerning for cellulitis..  Per GI MD note at admit, He has been admitted with anasarca and cellulitis of bilateral lower extremities and upper extremities; current ischemic toe, history of  alcohol abuse.     I have been contacted for issues related to dysphagia. He has had an endoscopy on 07/14/2023 by Dr. Maryruth the esophagus was dilated to 15 mm with mild mucosal disruption abnormal motility was also noted in the esophagus the plan was for outpatient esophageal manometry to be scheduled.  Barium esophagogram in May 2025 showed high-grade stricture of the Distal Esophagus extending to the GE junction. Since last EGD+dilation no improvement in dysphagia. No coughing during meals, just feel that the food goes down and stays there. No use of narcotics..    CXR at admit: No acute abnormalities.   DG Esophagus on 11/2: Redemonstration of approximately 5 cm long smooth walled marked  narrowing/stricture of the lower most esophagus extending up to the  GE junction. There is proximal mild-to-moderate esophageal dilation.  There are also resultant multiple underlying non propulsive  peristaltic waves.  Esophageal motility: Dysmotility with intermittent proximal escape.  Significantly delayed passage of contrast from the distal esophagus into the stomach secondary to stricture. Findings are essentially similar to the prior study.  No gross aspiration noted..   Per pt report: [facility] told me and my Daughter that there is nothing that can be done, nothing to do when he saw them. He stated these (GI)issues have been ongoing for 6 years.    Assessment / Plan / Recommendation  Clinical Impression   Pt seen for BSE. Pt  awake/alert x3, verbal and followed directions appropriately. Pt has KNOWN Esophageal phase Dysmotility w/ suspected Achalasia (Manometry has been rec'd Outpatient w/ GI). GI completed Dilation this admit. PRIOR TO PO'S GIVEN: pt exhibited Hiccups, Burping, and wet vocal quality from the Phlegm he brought up/expectorated. Pt endorsed that his GI/Esophageal phase Dysmotility issues have been ongoing for 6 years and they impact his oral intake. Pt has EXTENSIVE Esophageal phase  Dysmotility/Dysphagia w/ GI assessment of such in recent years. See chart notes confirming such.  On RA, afebrile. WBC WNL. Per GI EGD procedure yesterday, 01/12/24, Esophageal phase Dysmotility was noted w/ Benign- appearing esophageal stenosis. Dilated.. Per GI notes this admit: The patient's abnormal barium swallow may indicate that the patient is having Achalasia opposed to a stricture. There was clearly a mild stricture seen and dilated and the patient should feel better. If he does not then outpatient workup with a esophageal manometry may be needed.    Pt appears to present w/ functional oropharyngeal phase swallow w/ No immediate/overt oropharyngeal phase dysphagia noted, No neuromuscular deficits noted. Pt consumed po trials w/ No immediate/overt, clinical s/s of aspiration during po trials. No overt coughing nor decline in respiratory status/O2 sats during po trials accepted. Oral phase appeared [facility] for bolus management, mastication, and A-P transfer; oral clearing achieved b/t trials. Pt fed self. OM Exam appeared [facility] w/ no unilateral weakness. Pt was cautioned him on ~2-3 bites or sips then a PAUSE to allow for Esophageal clearing- always Rest Breaks w/ oral intake/meals. OF NOTE: The Hiccups, Burping, and wet vocal quality from the Phlegm he brought up did Not significantly increase during po intake of smaller amounts of po's but did seem problematic during the lunch meal (larger volume of intake) as reported by Team member. This was relayed to the MD.   Pt appears at reduced risk for aspiration from an oropharyngeal phase standpoint when following general aspiration precautions. Pt could be at risk for aspiration of REFLUX material from an Esophageal phase standpoint d/t the Esophageal phase Dysmotility dx'd Baseline. See GI reports.  Pt has challenging factors that could impact oropharyngeal swallowing to include deconditioning/weakness and Significant Esophageal phase Dysmotility/Dysphagia  w/ Regurgitation and ongoing Hiccups/Belching- ANY such presentation could increase risk for aspiration of REFLUX material. These factors can increase risk for dysphagia as well as decreased oral intake overall. Pt appears to have had weight loss since June 2025. Dietician updated.    Recommend a fairly regular/mech soft diet for ease of choice of foods but moistened/soft foods Cut/Chopped into Small pieces; gravies added to moisten foods; Thin liquids via Cup. Less Straw use d/t air swallowed. Recommend general aspiration and REFLUX precautions; alternate foods/liquids during meals. REST BREAKS during oral intake/meals to allow for Esophageal clearing. Pills CRUSHED in Puree(for ease of Esophageal clearing). Tray setup and positioning assistance for meals as needed. Strongly recommend REFLUX precautions d/t pt's CHRONIC Esophageal phase Dysmotility/Dysphagia.  F/u w/ GI for ongoing assessment and management of Esophageal phase Dysmotility. A MBSS is Not indicated as this test assesses for oropharyngeal phase issues of swallowing.  ST services will sign off at this time w/ MD to reconsult if new needs while admitted. NSG updated. Precautions posted at bedside, chart. Dietician updated.  Palliative Care ordered requested to address pt's GOC, support.  SLP Visit Diagnosis: Dysphagia, unspecified (R13.10) (Esophageal phase Dysmotility/Dysphagia- CHRONIC; deconditioned; hospitalization; apparent weight loss)    Aspiration Risk  Mild aspiration risk;Moderate aspiration risk;Risk for inadequate nutrition/hydration (risk for aspiration of REFLUX material)  Diet Recommendation   Thin;Dysphagia 3 (mechanical soft) (gravies for moistening/flavor- more pureed foods if desired) = a fairly regular/mech soft diet for ease of choice of foods but moistened/soft foods Cut/Chopped into Small pieces; gravies added to moisten foods; Thin liquids via Cup. Less Straw use d/t air swallowed. Recommend general aspiration and  REFLUX precautions; alternate foods/liquids during meals. REST BREAKS during oral intake/meals to allow for Esophageal clearing. Tray setup and positioning assistance for meals as needed. Strongly recommend REFLUX precautions d/t pt's CHRONIC Esophageal phase Dysmotility/Dysphagia.  Medication Administration: Crushed with puree (for ease of esophageal clearing)    Other  Recommendations Recommended Consults: Consider GI evaluation;Consider esophageal assessment (ongoing management/assessment/Education; Palliative Care consult) Oral Care Recommendations: Oral care BID;Patient independent with oral care     Assistance Recommended at Discharge  Intermittent at meals to support  Functional Status Assessment Patient has had a recent decline in their functional status and/or demonstrates limited ability to make significant improvements in function in a reasonable and predictable amount of time  Frequency and Duration  (n/a)   (n/a)       Prognosis Prognosis for improved oropharyngeal function: Guarded Barriers to Reach Goals: Time post onset;Severity of deficits;Motivation Barriers/Prognosis Comment: Esophageal phase Dysmotility/Dysphagia- CHRONIC; deconditioned; hospitalization; apparent weight loss      Swallow Study   General Date of Onset: 01/07/24 HPI: Pt is a 73 y.o. male has a history of hypertension heart failure CKD, bilateral lower extremity edema, Esophageal Dysmotility and Hiccups for 6 years per pt report, ETOH use in past, and history of Esophageal Stricture that has been dilated in the past.  Per MD note at admit, According to the patient and daughter for the last week or so he has had progressively worsening swelling of his upper and lower extremities.  Lower extremities are now diffusely weeping, which he states he has never had.  There is also an area of erythema to the lateral left lower extremity concerning for cellulitis..  Per GI MD note at admit, He has been admitted with  anasarca and cellulitis of bilateral lower extremities and upper extremities; current ischemic toe, history of alcohol abuse.     I have been contacted for issues related to dysphagia. He has had an endoscopy on 07/14/2023 by Dr. Maryruth the esophagus was dilated to 15 mm with mild mucosal disruption abnormal motility was also noted in the esophagus the plan was for outpatient esophageal manometry to be scheduled.  Barium esophagogram in May 2025 showed high-grade stricture of the Distal Esophagus extending to the GE junction. Since last EGD+dilation no improvement in dysphagia. No coughing during meals, just feel that the food goes down and stays there. No use of narcotics..   CXR at admit: No acute abnormalities.  DG Esophagus on 11/2: Redemonstration of approximately 5 cm long smooth walled marked  narrowing/stricture of the lower most esophagus extending up to the  GE junction. There is proximal mild-to-moderate esophageal dilation.  There are also resultant multiple underlying non propulsive  peristaltic waves.     Esophageal motility: Dysmotility with intermittent proximal escape.  Significantly delayed passage of contrast from the distal esophagus into the stomach secondary to stricture. Findings are essentially similar to the prior study.  No gross aspiration noted.. Type of Study: Bedside Swallow Evaluation Previous Swallow Assessment: 2022; 2025- easy to eat/soft foods, thin liquids (GI rec'd diet/foods) Diet Prior to this Study: Regular (SOFT diet per GI) Temperature Spikes Noted: No (wbc 6.5) Respiratory Status: Room air History of Recent Intubation:  No Behavior/Cognition: Alert;Cooperative;Pleasant mood (frustrated; hiccups/belching+) Oral Cavity Assessment: Within Functional Limits Oral Care Completed by SLP: Recent completion by staff Oral Cavity - Dentition: Adequate natural dentition Vision: Functional for self-feeding Self-Feeding Abilities: Able to feed self;Needs set up Patient  Positioning: Upright in bed (supported) Baseline Vocal Quality: Normal (min wet when belching up Phlegm) Volitional Cough: Strong Volitional Swallow: Able to elicit    Oral/Motor/Sensory Function Overall Oral Motor/Sensory Function: Within functional limits   Ice Chips Ice chips: Not tested   Thin Liquid Thin Liquid: Within functional limits Presentation: Cup;Self Fed;Straw (9 trials accepted)    Nectar Thick Nectar Thick Liquid: Not tested   Honey Thick Honey Thick Liquid: Not tested   Puree Puree: Within functional limits Presentation: Self Fed;Spoon (6 trials)   Solid     Solid: Within functional limits Presentation: Self Fed (1 trial accepted)        Comer Portugal, MS, CCC-SLP Speech Language Pathologist Rehab Services; [facility] [phone] (ascom) Stephanine Reas 01/13/2024,4:27 PM

## 2024-01-13 NOTE — Progress Notes (Signed)
 Progress Note   Patient: John Booker FMW:[id number] DOB: 1950-10-06 DOA: 01/07/2024     6 DOS: the patient was seen and examined on 01/13/2024   Brief hospital course: 73 y.o. male with Past medical history of HTN, HFpEF, CKD stage II, lymphedema of bilateral lower extremities, esophageal stricture s/p dilatation, chronic hiccups as reviewed from EMR, presented at [facility] ED with complaining of bilateral lower extremity and upper extremity edema which is gradually getting worse.  Patient is not a very good historian, as per patient's daughter lower extremity edema is gradually getting worse for the past 1 week and it is weeping.  There is an area of erythema in the left lower extremity on the lateral side which he scratched maybe had trauma.  Patient reported no fever.  While patient was getting ready to come to the ED, he fell and landed on his buttocks, no LOC.  Denies any pain after fall.   11/3.  Vascular surgery brought to the procedure suite for angiogram.  No intervention was done secondary to the patient needing an endoscopy and needing to be off anticoagulation.  The patient does have peripheral vascular disease.  Extensive lower extremity revascularization at a later date. 11/4.  EGD was done by Dr. Jinny and a benign-appearing esophageal stenosis was dilated. 11/5.  Patient still having trouble eating.  Case discussed with gastroenterology and will get speech pathology to evaluate for potential barium swallow.  May need manometry as outpatient.  Assessment and Plan: * Anasarca Initially was started on Lasix  drip.  Monitoring off diuretic.  Cellulitis of left lower extremity Cellulitis of bilateral lower extremities.  Gangrene right third toe and part of the first toe.,  Left foot ulcer.  Also on the left foot areas of scabs which could be starting of gangrene.  Patient on vancomycin  and Rocephin   Esophageal dysphagia Continue PPI.  EGD with dilation on 11/4.  Will get speech pathology  consultation for barium swallow.  Continue Protonix .  May end up needing outpatient manometry.  Stricture of esophagus Dilated on 11/4.  Continue PPI.  Hypomagnesemia Treated  AKI (acute kidney injury) Resolved.  Hypokalemia Treated  Hyponatremia Sodium 132        Subjective: Patient seen after breakfast and was coughing quite a bit and felt a little nauseous.  He has been having problems swallowing for a while.  Had an esophageal dilation yesterday.  Still feeling well.  Admitted with anasarca and gangrene of the third right toe.  Physical Exam: Vitals:   01/13/24 0914 01/13/24 0915 01/13/24 0916 01/13/24 1200  BP:    (!) 157/91  Pulse:      Resp: 16 14 (!) 22 (!) 31  Temp:      TempSrc:      SpO2:      Weight:      Height:       Physical Exam HENT:     Head: Normocephalic.     Mouth/Throat:     Pharynx: No oropharyngeal exudate.  Eyes:     General: Lids are normal.     Conjunctiva/sclera: Conjunctivae normal.  Cardiovascular:     Rate and Rhythm: Normal rate and regular rhythm.     Heart sounds: Normal heart sounds, S1 normal and S2 normal.  Pulmonary:     Breath sounds: No decreased breath sounds, wheezing, rhonchi or rales.  Abdominal:     Palpations: Abdomen is soft.     Tenderness: There is no abdominal tenderness.  Musculoskeletal:  Right lower leg: Swelling present.     Left lower leg: Swelling present.  Skin:    General: Skin is warm.     Comments: Gangrene third right toe, tip of first toe right foot.  Dark looking scabs on left foot which could be the starting of gangrene.  Ulcer left foot.  Neurological:     Mental Status: He is alert and oriented to person, place, and time.     Data Reviewed: Sodium 132, potassium 3.5, creatinine 0.79, white blood cell count 6.5, hemoglobin 10.7, platelet count 230  Family Communication: Updated patient's daughter on the phone  Disposition: Status is: Inpatient Remains inpatient appropriate because:  Looks like vascular procedure tomorrow  Planned Discharge Destination: Rehab    Time spent: 28 minutes  Author: Charlie Patterson, MD 01/13/2024 1:06 PM  For on call review [email or web address].

## 2024-01-13 NOTE — Hospital Course (Addendum)
 73 y.o. male with Past medical history of HTN, HFpEF, CKD stage II, lymphedema of bilateral lower extremities, esophageal stricture s/p dilatation, chronic hiccups as reviewed from EMR, presented at [facility] ED with complaining of bilateral lower extremity and upper extremity edema which is gradually getting worse.  Patient is not a very good historian, as per patient's daughter lower extremity edema is gradually getting worse for the past 1 week and it is weeping.  There is an area of erythema in the left lower extremity on the lateral side which he scratched maybe had trauma.  Patient reported no fever.  While patient was getting ready to come to the ED, he fell and landed on his buttocks, no LOC.  Denies any pain after fall.   11/3.  Vascular surgery brought to the procedure suite for angiogram.  No intervention was done secondary to the patient needing an endoscopy and needing to be off anticoagulation.  The patient does have peripheral vascular disease.  Extensive lower extremity revascularization at a later date. 11/4.  EGD was done by Dr. Jinny and a benign-appearing esophageal stenosis was dilated. 11/5.  Patient still having trouble eating.  Case discussed with gastroenterology and will get speech pathology to evaluate for potential barium swallow.  May need manometry as outpatient. 11/6.  Dr. Marea did a mechanical thrombectomy of the right SFA, tibioperoneal trunk and posterior tibial artery, stent placement right SFA, angioplasty right posterior tibial artery and tibioperoneal trunk, stent placement right tibioperoneal trunk. 11/7.  Dr. Ashley did an amputation of the interphalangeal joint great toe and amputation of the third toe on the right foot secondary to gangrene. 11/8.  No pain in right foot after amputation.  Podiatry cleared to get out of the hospital.  Looking into rehab beds. 11/9.  Medically stable to go out to rehab once bed available and insurance authorizes. 11/10.  Patient having some  dizziness this afternoon.  Patient orthostatic with physical therapy.  Will hold antihypertensive medications and give a fluid bolus.  Imodium  as needed for diarrhea.  Stop antibiotic as per patient request.

## 2024-01-13 NOTE — Assessment & Plan Note (Addendum)
-   Normalized

## 2024-01-13 NOTE — Assessment & Plan Note (Addendum)
 Continue PPI.  EGD with dilation on 11/4.  Appreciate speech pathology consultation.  Continue Protonix .  May end up needing outpatient manometry.  Patient declined PEG tube.

## 2024-01-13 NOTE — Assessment & Plan Note (Signed)
 Treated.

## 2024-01-13 NOTE — Assessment & Plan Note (Signed)
 Resolved

## 2024-01-13 NOTE — Progress Notes (Signed)
 Progress Note    01/13/2024 3:24 PM 1 Day Post-Op  Subjective:  John Booker is a 73 yo male who is POD #2 From:  Date of Surgery: 01/11/2024   Surgeon(s):DEW,JASON     Assistants:none   Pre-operative Diagnosis: PAD with ulceration and gangrenous changes right lower extremity   Post-operative diagnosis:  Same   Procedure(s) Performed:             1.  Ultrasound guidance for vascular access left femoral artery             2.  Catheter placement into right SFA from left femoral approach             3.  Aortogram and selective right lower extremity angiogram             4.  Celt closure device left femoral artery  Patient is resting comfortably in bed this afternoon.  He underwent an upper endoscopy earlier today.  They did an esophageal dilation due to esophageal stenosis.  No complications to note.  No bleeding postprocedure.  No complaints this afternoon.  Vitals all remained stable.   Vitals:   01/13/24 0916 01/13/24 1200  BP:  (!) 157/91  Pulse:    Resp: (!) 22 18  Temp:    SpO2:     Physical Exam: Cardiac:  RRR, normal S1 and S2.  No murmurs appreciated. Lungs: Normal nonlabored breathing, no rales rhonchi or wheezing noted. Incisions: Right groin with dressing clean dry and intact.  No hematoma seroma note. Extremities: Palpable pulses in the upper extremities.  Unable to palpate pulses in the lower extremities due to +4 edema and swelling. Abdomen: Positive bowel sounds throughout, soft, nontender and nondistended. Neurologic: Alert and oriented x 3.  Answers all questions and follows commands appropriately  CBC    Component Value Date/Time   WBC 6.5 01/08/2024 0405   RBC 3.32 (L) 01/08/2024 0405   HGB 10.7 (L) 01/08/2024 0405   HGB 15.7 04/15/2012 2049   HCT 32.8 (L) 01/08/2024 0405   HCT 47.7 04/15/2012 2049   PLT 230 01/08/2024 0405   PLT 175 04/15/2012 2049   MCV 98.8 01/08/2024 0405   MCV 92 04/15/2012 2049   MCH 32.2 01/08/2024 0405   MCHC 32.6  01/08/2024 0405   RDW 12.5 01/08/2024 0405   RDW 13.9 04/15/2012 2049   LYMPHSABS 0.5 (L) 01/07/2024 1043   MONOABS 0.4 01/07/2024 1043   EOSABS 0.0 01/07/2024 1043   BASOSABS 0.0 01/07/2024 1043    BMET    Component Value Date/Time   NA 132 (L) 01/12/2024 0355   NA 135 (L) 04/15/2012 2049   K 3.5 01/12/2024 0355   K 3.5 04/15/2012 2049   CL 95 (L) 01/12/2024 0355   CL 98 04/15/2012 2049   CO2 29 01/12/2024 0355   CO2 30 04/15/2012 2049   GLUCOSE 85 01/12/2024 0355   GLUCOSE 107 (H) 04/15/2012 2049   BUN 22 01/12/2024 0355   BUN 17 04/15/2012 2049   CREATININE 0.79 01/12/2024 0355   CREATININE 1.15 12/20/2020 0946   CALCIUM 8.4 (L) 01/12/2024 0355   CALCIUM 8.5 04/15/2012 2049   GFRNONAA >60 01/12/2024 0355   GFRNONAA 73 12/20/2019 0916   GFRAA 85 12/20/2019 0916    INR    Component Value Date/Time   INR 1.1 12/13/2022 1514     Intake/Output Summary (Last 24 hours) at 01/13/2024 1524 Last data filed at 01/13/2024 0441 Gross per 24 hour  Intake 480  ml  Output 200 ml  Net 280 ml     Assessment/Plan:  73 y.o. male is s/p SEE ABOVE 1 Day Post-Op   PLAN Vascular surgery plans on taking the patient to the vascular lab tomorrow on 01/14/2024 for right lower extremity angiogram with possible intervention.  I discussed in detail with the patient again the procedure, benefits, risk, and complications.  He verbalizes understanding and wishes to proceed.  Answered all his questions this afternoon.  Patient will be made n.p.o. after midnight tonight for procedure tomorrow.  Patient is currently not on any anticoagulation.  DVT prophylaxis:  NONE   [name] R [name] [facility] 01/13/2024 3:24 PM

## 2024-01-13 NOTE — Assessment & Plan Note (Addendum)
 Cellulitis of bilateral lower extremities.  Gangrene right third toe and part of the first toe,  Left foot ulcer.  Also on the left foot areas of scabs which could be starting of gangrene.  Patient received vancomycin  and Rocephin  and switched over to Augmentin .  Had toe amputations 11/7 by Dr. Ashley.

## 2024-01-13 NOTE — Progress Notes (Signed)
 Occupational Therapy Treatment Patient Details Name: John Booker MRN: [id number] DOB: 17-Nov-1950 Today's Date: 01/13/2024   History of present illness Pt is a 73 y/o M admitted on 01/07/24 following a fall, c/o worsening BLE & BUE edema. Pt is being treated for anasarca, BLE cellulitis, ischemic R 1st toe ulcer & ischemia of 3rd toe. PMH: HTN, HFpEF, CKD 2, lymphedema of BLE, chronic hiccups, EtOH abuse   OT comments  Patient seen for OT treatment on this date. Upon arrival to room patient supine in bed, reluctant but agreeable to treatment. Patient was able to transition to EOB with min A to lift trunk, able to scoot to EOB with increased time and effort. Patient easily agitated with OT during tx but ultimately agreeable to participate in very light HEP at EOB. When OT discussed dc home per his request he was unaware of any plans. Patient reports he hasn't stood up in 8 days, he refused to attempt with OT due to fear of falling but didn't refuse the idea of trying later today.  Patient transitioned back to supine with SBA, required A to move up in bed. RN present, noted blood/picked scab on RLE.  Patient ended treatment in bed with bed alarm on and all needs within reach, RN en route back to room to address RLE. Patient making fair progress toward goals on this date as he actually agreeable to participate, will continue to follow POC. Discharge recommendation remains appropriate.         If plan is discharge home, recommend the following:  Two people to help with walking and/or transfers;Two people to help with bathing/dressing/bathroom;Supervision due to cognitive status   Equipment Recommendations  Other (comment) (defer to next venue of care)    Recommendations for Other Services      Precautions / Restrictions Precautions Precautions: Fall Recall of Precautions/Restrictions: Intact Restrictions Weight Bearing Restrictions Per Provider Order: No       Mobility Bed  Mobility Overal bed mobility: Needs Assistance Bed Mobility: Supine to Sit, Sit to Supine     Supine to sit: Min assist Sit to supine: Min assist   General bed mobility comments: min A to lift trunk    Transfers Overall transfer level:  (refused)                       Balance Overall balance assessment: Needs assistance Sitting-balance support: Bilateral upper extremity supported, Feet supported Sitting balance-Leahy Scale: Fair                                     ADL either performed or assessed with clinical judgement   ADL                                              Extremity/Trunk Assessment              Vision       Perception     Praxis     Communication Communication Communication: Impaired Factors Affecting Communication: Hearing impaired   Cognition Arousal: Alert Behavior During Therapy: Agitated Cognition: Cognition impaired     Awareness: Intellectual awareness impaired, Online awareness impaired     Executive functioning impairment (select all impairments): Reasoning, Problem solving OT - Cognition Comments: more agreeable today  than previously noted, still resistant/agitated                 Following commands: Impaired Following commands impaired: Follows one step commands with increased time      Cueing   Cueing Techniques: Verbal cues, Gestural cues, Tactile cues, Visual cues  Exercises Exercises: Other exercises Other Exercises Other Exercises: UB mobility including neck flexion/extension, head rotation and AAROM shoulder flexion; patient only performed 5 reps of each exercise before reporting fatigue    Shoulder Instructions       General Comments      Pertinent Vitals/ Pain       Pain Assessment Pain Assessment: 0-10 Pain Score: 5  Pain Location: BLE Pain Descriptors / Indicators: Grimacing, Discomfort, Guarding Pain Intervention(s): Monitored during session  Home  Living                                          Prior Functioning/Environment              Frequency  Min 2X/week        Progress Toward Goals  OT Goals(current goals can now be found in the care plan section)  Progress towards OT goals: Progressing toward goals  Acute Rehab OT Goals Patient Stated Goal: to go home OT Goal Formulation: With patient Time For Goal Achievement: 01/23/24 Potential to Achieve Goals: Fair ADL Goals Pt Will Perform Grooming: with supervision;sitting Pt Will Perform Lower Body Dressing: with mod assist;sitting/lateral leans Pt Will Transfer to Toilet: with mod assist;stand pivot transfer Pt Will Perform Toileting - Clothing Manipulation and hygiene: with mod assist;sitting/lateral leans;sit to/from stand  Plan      Co-evaluation                 AM-PAC OT 6 Clicks Daily Activity     Outcome Measure   Help from another person eating meals?: None Help from another person taking care of personal grooming?: A Little Help from another person toileting, which includes using toliet, bedpan, or urinal?: Total Help from another person bathing (including washing, rinsing, drying)?: Total Help from another person to put on and taking off regular upper body clothing?: A Lot Help from another person to put on and taking off regular lower body clothing?: Total 6 Click Score: 12    End of Session    OT Visit Diagnosis: Muscle weakness (generalized) (M62.81);Other abnormalities of gait and mobility (R26.89)   Activity Tolerance Patient limited by fatigue   Patient Left in bed;with nursing/sitter in room;with bed alarm set   Nurse Communication Mobility status        Time: 8850-8795 OT Time Calculation (min): 15 min  Charges: OT General Charges $OT Visit: 1 Visit OT Treatments $Self Care/Home Management : 8-22 mins  Rogers Clause, OT/L MSOT, 01/13/2024

## 2024-01-14 ENCOUNTER — Inpatient Hospital Stay: Admitting: Anesthesiology

## 2024-01-14 ENCOUNTER — Encounter
Admission: EM | Disposition: A | Discharge status: Skilled Nursing Facility | Payer: Self-pay | Source: Home / Self Care | Attending: Internal Medicine

## 2024-01-14 ENCOUNTER — Encounter: Payer: Self-pay | Admitting: Student

## 2024-01-14 DIAGNOSIS — L03116 Cellulitis of left lower limb: Secondary | ICD-10-CM | POA: Diagnosis not present

## 2024-01-14 DIAGNOSIS — I743 Embolism and thrombosis of arteries of the lower extremities: Secondary | ICD-10-CM | POA: Diagnosis not present

## 2024-01-14 DIAGNOSIS — I739 Peripheral vascular disease, unspecified: Secondary | ICD-10-CM | POA: Diagnosis not present

## 2024-01-14 DIAGNOSIS — R1319 Other dysphagia: Secondary | ICD-10-CM | POA: Diagnosis not present

## 2024-01-14 DIAGNOSIS — I70261 Atherosclerosis of native arteries of extremities with gangrene, right leg: Secondary | ICD-10-CM | POA: Diagnosis not present

## 2024-01-14 DIAGNOSIS — R601 Generalized edema: Secondary | ICD-10-CM | POA: Diagnosis not present

## 2024-01-14 DIAGNOSIS — L97919 Non-pressure chronic ulcer of unspecified part of right lower leg with unspecified severity: Secondary | ICD-10-CM | POA: Diagnosis not present

## 2024-01-14 HISTORY — PX: LOWER EXTREMITY ANGIOGRAPHY: CATH118251

## 2024-01-14 LAB — C-REACTIVE PROTEIN: CRP: 0.9 mg/dL (ref <1.0)

## 2024-01-14 SURGERY — LOWER EXTREMITY ANGIOGRAPHY
Anesthesia: General | Laterality: Right

## 2024-01-14 MED ORDER — DIPHENHYDRAMINE HCL 50 MG/ML IJ SOLN: 50.0000 mg | Freq: Once | INTRAMUSCULAR | Status: DC | PRN

## 2024-01-14 MED ORDER — FENTANYL CITRATE (PF) 100 MCG/2ML IJ SOLN: 25.0000 ug | INTRAMUSCULAR | Status: DC | PRN

## 2024-01-14 MED ORDER — HEPARIN SODIUM (PORCINE) 1000 UNIT/ML IJ SOLN
INTRAMUSCULAR | Status: AC
Start: 2024-01-14 — End: 2024-01-14
  Filled 2024-01-14: qty 10

## 2024-01-14 MED ORDER — VITAMIN C 500 MG PO TABS
500.0000 mg | ORAL_TABLET | Freq: Two times a day (BID) | ORAL | Status: DC
  Administered 2024-01-14 – 2024-01-20 (×12): 500 mg via ORAL
  Filled 2024-01-14 (×13): qty 1

## 2024-01-14 MED ORDER — PROPOFOL 10 MG/ML IV BOLUS
INTRAVENOUS | Status: DC | PRN
  Administered 2024-01-14: 90 mg via INTRAVENOUS

## 2024-01-14 MED ORDER — PHENYLEPHRINE 80 MCG/ML (10ML) SYRINGE FOR IV PUSH (FOR BLOOD PRESSURE SUPPORT)
PREFILLED_SYRINGE | INTRAVENOUS | Status: DC | PRN
  Administered 2024-01-14: 160 ug via INTRAVENOUS
  Administered 2024-01-14: 80 ug via INTRAVENOUS
  Administered 2024-01-14: 200 ug via INTRAVENOUS
  Administered 2024-01-14 (×3): 160 ug via INTRAVENOUS
  Administered 2024-01-14: 80 ug via INTRAVENOUS
  Administered 2024-01-14 (×2): 160 ug via INTRAVENOUS
  Administered 2024-01-14: 120 ug via INTRAVENOUS

## 2024-01-14 MED ORDER — LIDOCAINE-EPINEPHRINE (PF) 1 %-1:200000 IJ SOLN
INTRAMUSCULAR | Status: DC | PRN
  Administered 2024-01-14: 20 mL

## 2024-01-14 MED ORDER — LIDOCAINE HCL (CARDIAC) PF 100 MG/5ML IV SOSY
PREFILLED_SYRINGE | INTRAVENOUS | Status: DC | PRN
  Administered 2024-01-14: 60 mg via INTRAVENOUS

## 2024-01-14 MED ORDER — LACTATED RINGERS IV SOLN: INTRAVENOUS | Status: DC | PRN

## 2024-01-14 MED ORDER — PROPOFOL 1000 MG/100ML IV EMUL
INTRAVENOUS | Status: AC
Start: 2024-01-14 — End: 2024-01-14
  Filled 2024-01-14: qty 100

## 2024-01-14 MED ORDER — PROPOFOL 10 MG/ML IV BOLUS
INTRAVENOUS | Status: AC
  Filled 2024-01-14: qty 20

## 2024-01-14 MED ORDER — HEPARIN (PORCINE) IN NACL 2000-0.9 UNIT/L-% IV SOLN
INTRAVENOUS | Status: DC | PRN
  Administered 2024-01-14: 1000 mL

## 2024-01-14 MED ORDER — ONDANSETRON HCL 4 MG/2ML IJ SOLN
INTRAMUSCULAR | Status: AC
  Filled 2024-01-14: qty 2

## 2024-01-14 MED ORDER — ENSURE PLUS HIGH PROTEIN PO LIQD
237.0000 mL | Freq: Three times a day (TID) | ORAL | Status: DC
  Administered 2024-01-17 – 2024-01-21 (×2): 237 mL via ORAL

## 2024-01-14 MED ORDER — CHLORHEXIDINE GLUCONATE 4 % EX SOLN
60.0000 mL | Freq: Once | CUTANEOUS | Status: DC
Start: 2024-01-15 — End: 2024-01-15

## 2024-01-14 MED ORDER — PHENYLEPHRINE 80 MCG/ML (10ML) SYRINGE FOR IV PUSH (FOR BLOOD PRESSURE SUPPORT)
PREFILLED_SYRINGE | INTRAVENOUS | Status: AC
  Filled 2024-01-14: qty 10

## 2024-01-14 MED ORDER — IODIXANOL 320 MG/ML IV SOLN
INTRAVENOUS | Status: DC | PRN
  Administered 2024-01-14: 30 mL via INTRA_ARTERIAL

## 2024-01-14 MED ORDER — ZINC SULFATE 220 (50 ZN) MG PO CAPS
220.0000 mg | ORAL_CAPSULE | Freq: Every day | ORAL | Status: DC
Start: 2024-01-14 — End: 2024-01-21
  Administered 2024-01-14 – 2024-01-20 (×6): 220 mg via ORAL
  Filled 2024-01-14 (×7): qty 1

## 2024-01-14 MED ORDER — CEFAZOLIN SODIUM-DEXTROSE 2-4 GM/100ML-% IV SOLN
2.0000 g | INTRAVENOUS | Status: AC
  Administered 2024-01-15: 2 g via INTRAVENOUS
  Filled 2024-01-14: qty 100

## 2024-01-14 MED ORDER — FENTANYL CITRATE (PF) 100 MCG/2ML IJ SOLN
INTRAMUSCULAR | Status: AC
  Filled 2024-01-14: qty 2

## 2024-01-14 MED ORDER — MIDAZOLAM HCL 2 MG/ML PO SYRP
8.0000 mg | ORAL_SOLUTION | Freq: Once | ORAL | Status: DC | PRN
  Filled 2024-01-14: qty 5

## 2024-01-14 MED ORDER — CEFAZOLIN SODIUM-DEXTROSE 2-4 GM/100ML-% IV SOLN
2.0000 g | INTRAVENOUS | Status: DC
  Filled 2024-01-14: qty 100

## 2024-01-14 MED ORDER — SODIUM CHLORIDE 0.9 % IV SOLN: INTRAVENOUS | Status: DC

## 2024-01-14 MED ORDER — HEPARIN SODIUM (PORCINE) 1000 UNIT/ML IJ SOLN
INTRAMUSCULAR | Status: DC | PRN
  Administered 2024-01-14: 5000 [IU] via INTRAVENOUS

## 2024-01-14 MED ORDER — FAMOTIDINE 20 MG PO TABS: 40.0000 mg | ORAL_TABLET | Freq: Once | ORAL | Status: DC | PRN

## 2024-01-14 MED ORDER — DEXAMETHASONE SOD PHOSPHATE PF 10 MG/ML IJ SOLN
INTRAMUSCULAR | Status: DC | PRN
  Administered 2024-01-14: 10 mg via INTRAVENOUS

## 2024-01-14 MED ORDER — LIDOCAINE HCL (PF) 2 % IJ SOLN
INTRAMUSCULAR | Status: AC
  Filled 2024-01-14: qty 5

## 2024-01-14 MED ORDER — METHYLPREDNISOLONE SODIUM SUCC 125 MG IJ SOLR: 125.0000 mg | Freq: Once | INTRAMUSCULAR | Status: DC | PRN

## 2024-01-14 MED ORDER — POVIDONE-IODINE 10 % EX SWAB
2.0000 | Freq: Once | CUTANEOUS | Status: DC
Start: 2024-01-15 — End: 2024-01-15

## 2024-01-14 MED ORDER — CEFAZOLIN SODIUM-DEXTROSE 2-4 GM/100ML-% IV SOLN
INTRAVENOUS | Status: AC
  Filled 2024-01-14: qty 100

## 2024-01-14 MED ORDER — PROSOURCE PLUS PO LIQD
30.0000 mL | Freq: Two times a day (BID) | ORAL | Status: DC
  Administered 2024-01-14 – 2024-01-17 (×2): 30 mL via ORAL

## 2024-01-14 MED ORDER — FENTANYL CITRATE (PF) 100 MCG/2ML IJ SOLN
INTRAMUSCULAR | Status: DC | PRN
  Administered 2024-01-14 (×2): 50 ug via INTRAVENOUS

## 2024-01-14 SURGICAL SUPPLY — 21 items
BALLOON JADE .014 4.0 X 40 0 (BALLOONS) IMPLANT
BALLOON LUTONIX 018 5X150X130 (BALLOONS) IMPLANT
BALLOON ULTRVRSE 3X220X150 (BALLOONS) IMPLANT
CANISTER PENUMBRA ENGINE (MISCELLANEOUS) IMPLANT
CATH BEACON 5 .038 100 VERT TP (CATHETERS) IMPLANT
CATH CXI SUPP ANG 4FR 135 (CATHETERS) IMPLANT
CATH LIGHTNING BOLT 6 150 (CATHETERS) IMPLANT
COVER DRAPE FLUORO 36X44 (DRAPES) IMPLANT
COVER PROBE ULTRASOUND 5X96 (MISCELLANEOUS) IMPLANT
DEVICE PRESTO INFLATION (MISCELLANEOUS) IMPLANT
DEVICE VASC CLSR CELT ART 6 (Vascular Products) IMPLANT
GLIDEWIRE ADV .035X260CM (WIRE) IMPLANT
PACK ANGIOGRAPHY (CUSTOM PROCEDURE TRAY) ×1 IMPLANT
SHEATH ANL2 6FRX45 HC (SHEATH) IMPLANT
SHEATH BRITE TIP 5FRX11 (SHEATH) IMPLANT
SHEATH BRITE TIP 6FRX11 (SHEATH) IMPLANT
STENT OTW ESPRIT BTK 3.5X38 (Permanent Stent) IMPLANT
STENT VIABAHN 6X150X120 (Permanent Stent) IMPLANT
WIRE COMMAND ST ANG 014 300 (WIRE) IMPLANT
WIRE G V18X300CM (WIRE) IMPLANT
WIRE J 3MM .035X145CM (WIRE) IMPLANT

## 2024-01-14 NOTE — Anesthesia Procedure Notes (Signed)
 Procedure Name: LMA Insertion Date/Time: 01/14/2024 11:13 AM  Performed by: Lorrene Camelia LABOR, CRNAPre-anesthesia Checklist: Patient identified, Patient being monitored, Emergency Drugs available and Suction available Patient Re-evaluated:Patient Re-evaluated prior to induction Oxygen Delivery Method: Circle system utilized Preoxygenation: Pre-oxygenation with 100% oxygen Induction Type: IV induction Ventilation: Mask ventilation without difficulty LMA: LMA inserted LMA Size: 5.0 Tube type: Oral Number of attempts: 1 Placement Confirmation: positive ETCO2 and breath sounds checked- equal and bilateral Tube secured with: Tape Dental Injury: Teeth and Oropharynx as per pre-operative assessment

## 2024-01-14 NOTE — Assessment & Plan Note (Addendum)
 11/6. Patient had mechanical thrombectomy of the right SFA, tibioperoneal trunk and posterior tibial artery, stent placement to right SFA, angioplasty of right posterior tibial artery and tibioperoneal trunk, stent placement right tibioperoneal trunk.  On aspirin , Plavix and Lipitor.  Patient declined compression wraps.

## 2024-01-14 NOTE — Progress Notes (Signed)
 PT Cancellation Note  Patient Details Name: John Booker MRN: [id number] DOB: December 29, 1950   Cancelled Treatment:    Reason Eval/Treat Not Completed: Patient at procedure or test/unavailable Off unit at procedure. PT will re-attempt at later date/time as schedule allows.   Maryanne Finder, PT, DPT Physical Therapist - [facility]  [facility]    Cyla Haluska A Cherika Jessie 01/14/2024, 8:43 AM

## 2024-01-14 NOTE — Anesthesia Preprocedure Evaluation (Signed)
 Anesthesia Evaluation  Patient identified by MRN, date of birth, ID band Patient awake    Reviewed: Allergy & Precautions, H&P , NPO status , Patient's Chart, lab work & pertinent test results, reviewed documented beta blocker date and time   History of Anesthesia Complications Negative for: history of anesthetic complications  Airway Mallampati: II   Neck ROM: full    Dental  (+) Poor Dentition   Pulmonary neg pulmonary ROS, former smoker   Pulmonary exam normal        Cardiovascular Exercise Tolerance: Poor hypertension, On Medications (-) angina +CHF and + DOE  (-) Past MI and (-) Cardiac Stents Normal cardiovascular exam(-) dysrhythmias (-) Valvular Problems/Murmurs Rhythm:regular Rate:Normal     Neuro/Psych  Headaches PSYCHIATRIC DISORDERS       Neuromuscular disease    GI/Hepatic Neg liver ROS, hiatal hernia,GERD  Medicated,,  Endo/Other  negative endocrine ROS    Renal/GU Renal disease  negative genitourinary   Musculoskeletal   Abdominal   Peds  Hematology negative hematology ROS (+)   Anesthesia Other Findings Past Medical History: No date: (HFpEF) heart failure with preserved ejection fraction (HCC)     Comment:  a.) TTE 04/11/2021: EF 60-65%, no RWMAs, G1DD, mild LAE,              RVSF norm, asc Ao 38 mm; b.) TTE 11/18/2021: EF 60-65%,               no RWMAs, mild LVH, G1DD, norm RVSH; c.) TEE 11/20/2021:               EF 60-65%, no RWMAs, no LAA thrombus, triv MR, no               valvular vegetation, no IAS; d.) TTE 11/24/2022: EF               60-65%, no RWMAs, mod LVH, norm RVSF No date: Alcohol abuse, in remission No date: Aortic atherosclerosis No date: Bacteremia due to Gram-negative bacteria (Klebsiella oxytoca)     Comment:  a.) source unidentified; b.) admitted [facility]/20/2021              - 12/28/2021 No date: Bilateral lower extremity edema No date: BPH (benign prostatic  hyperplasia) No date: Cholelithiasis No date: Chronic hiccups No date: CKD (chronic kidney disease), stage III (HCC) No date: DOE (dyspnea on exertion) No date: Esophageal dysmotility No date: GERD (gastroesophageal reflux disease) No date: Hemorrhoids No date: Hepatic cyst 2024: History of bilateral cataract extraction No date: History of hiatal hernia 02/18/2022: History of MSSA bacteremia secondary to foot wounds, s/p  treatment 9/13 - 12/28/21 No date: Hypertension No date: Mediastinal mass No date: Memory changes No date: Metabolic encephalopathy     Comment:  a.) in the setting of bacteremia/sepsis 11/2021: MSSA bacteremia     Comment:  a.) source was foot wounds and probable septic arthritis              in the lumbar spine No date: Nephrolithiasis No date: Neuropathy No date: Non-traumatic rhabdomyolysis No date: Paraspinal mass No date: Renal cyst, right No date: Sigmoid diverticulosis Past Surgical History: 02/16/2023: BRONCHIAL WASHINGS; N/A     Comment:  Procedure: BRONCHIAL WASHINGS;  Surgeon: Parris Manna, MD;  Location: [facility] ORS;  Service: Thoracic;                Laterality: N/A; 11/13/2022: CATARACT  EXTRACTION W/PHACO; Left     Comment:  Procedure: CATARACT EXTRACTION PHACO AND INTRAOCULAR               LENS PLACEMENT (IOC) LEFT OMIDRIA    13.58  01:23.3;                Surgeon: Enola Feliciano Hugger, MD;  Location: [facility]               SURGERY CNTR;  Service: Ophthalmology;  Laterality: Left; 01/08/2023: CATARACT EXTRACTION W/PHACO; Right     Comment:  Procedure: CATARACT EXTRACTION PHACO AND INTRAOCULAR               LENS PLACEMENT (IOC) RIGHT OMIDRIA  14.23 01:16.5;                Surgeon: Enola Feliciano Hugger, MD;  Location: [facility]               SURGERY CNTR;  Service: Ophthalmology;  Laterality:               Right; 07/14/2023: ESOPHAGOGASTRODUODENOSCOPY; N/A     Comment:  Procedure: EGD (ESOPHAGOGASTRODUODENOSCOPY);  Surgeon:                Maryruth Ole DASEN, MD;  Location: [facility] ENDOSCOPY;                Service: Endoscopy;  Laterality: N/A; 05/26/2018: ESOPHAGOGASTRODUODENOSCOPY (EGD) WITH PROPOFOL ; N/A     Comment:  Procedure: ESOPHAGOGASTRODUODENOSCOPY (EGD) WITH               PROPOFOL ;  Surgeon: Janalyn Keene NOVAK, MD;  Location:               [facility] ENDOSCOPY;  Service: Endoscopy;  Laterality: N/A; 12/28/2020: ESOPHAGOGASTRODUODENOSCOPY (EGD) WITH PROPOFOL ; N/A     Comment:  Procedure: ESOPHAGOGASTRODUODENOSCOPY (EGD) WITH               PROPOFOL ;  Surgeon: Onita Elspeth Sharper, DO;  Location:              [facility] ENDOSCOPY;  Service: Gastroenterology;  Laterality:               N/A; 10/09/2021: ESOPHAGOGASTRODUODENOSCOPY (EGD) WITH PROPOFOL ; N/A     Comment:  Procedure: ESOPHAGOGASTRODUODENOSCOPY (EGD) WITH               PROPOFOL ;  Surgeon: Therisa Bi, MD;  Location: [facility]               ENDOSCOPY;  Service: Gastroenterology;  Laterality: N/A; 02/16/2023: FLEXIBLE BRONCHOSCOPY; N/A     Comment:  Procedure: FLEXIBLE BRONCHOSCOPY;  Surgeon: Parris Manna, MD;  Location: [facility] ORS;  Service: Thoracic;                Laterality: N/A; 11/19/2015: INCISION AND DRAINAGE PERIRECTAL ABSCESS; N/A     Comment:  Procedure: IRRIGATION AND DEBRIDEMENT PERIRECTAL ABSCESS              and debridement of scrotal abscess;  Surgeon: Charlie FORBES Fell, MD;  Location: [facility] ORS;  Service: General;                Laterality: N/A; 11/20/2015: INCISION AND DRAINAGE PERIRECTAL ABSCESS; N/A     Comment:  Procedure: IRRIGATION AND DEBRIDEMENT PERIRECTAL ABSCESS              /  WITH DRESSING CHANGE;  Surgeon: Charlie FORBES Fell, MD;                Location: [facility] ORS;  Service: General;  Laterality: N/A;               peri rectal site 11/21/2015: INCISION AND DRAINAGE PERIRECTAL ABSCESS; N/A     Comment:  Procedure: IRRIGATION AND DEBRIDEMENT PERIRECTAL ABSCESS              WITH DRESSING CHANGE;  Surgeon: Charlie FORBES Fell, MD;                 Location: [facility] ORS;  Service: General;  Laterality: N/A; 01/11/2024: LOWER EXTREMITY ANGIOGRAPHY; Right     Comment:  Procedure: Lower Extremity Angiography;  Surgeon: Marea Selinda RAMAN, MD;  Location: [facility] INVASIVE CV LAB;  Service:               Cardiovascular;  Laterality: Right; 1989: MINOR HEMORRHOIDECTOMY 11/20/2021: TEE WITHOUT CARDIOVERSION; N/A     Comment:  Procedure: TRANSESOPHAGEAL ECHOCARDIOGRAM (TEE);                Surgeon: Hester Wolm PARAS, MD;  Location: [facility] ORS;                Service: Cardiovascular;  Laterality: N/A;  8:00am BMI    Body Mass Index: 20.64 kg/m     Reproductive/Obstetrics negative OB ROS                              Anesthesia Physical Anesthesia Plan  ASA: 3  Anesthesia Plan: General   Post-op Pain Management:    Induction: Intravenous  PONV Risk Score and Plan: 2 and Ondansetron , Dexamethasone , Midazolam  and Treatment may vary due to age or medical condition  Airway Management Planned: LMA  Additional Equipment:   Intra-op Plan:   Post-operative Plan: Extubation in OR  Informed Consent: I have reviewed the patients History and Physical, chart, labs and discussed the procedure including the risks, benefits and alternatives for the proposed anesthesia with the patient or authorized representative who has indicated his/her understanding and acceptance.     Dental Advisory Given  Plan Discussed with: CRNA  Anesthesia Plan Comments:          Anesthesia Quick Evaluation

## 2024-01-14 NOTE — Op Note (Signed)
 [facility]  Percutaneous Study/Intervention Procedural Note   Date of Surgery: 01/14/2024  Surgeon(s):Koichi Platte    Assistants:none  Pre-operative Diagnosis: PAD with ulceration and gangrenous changes RLE  Post-operative diagnosis:  Same  Procedure(s) Performed:             1.  Ultrasound guidance for vascular access left femoral artery             2.  Catheter placement into right common femoral artery from left femoral approach             3.  Selective right lower extremity angiogram             4.  Mechanical thrombectomy of the right SFA, tibioperoneal trunk and posterior tibial artery with the penumbra 6 bolt device             5.  Stent placement to the right SFA with 6 mm diameter by 15 cm length Viabahn stent  6.  Angioplasty of the right posterior tibial artery and tibioperoneal trunk with 3 mm diameter by 22 cm length angioplasty balloon  7.  Stent placement to the right tibioperoneal trunk with 3.5 mm diameter by 38 mm length Esprit scaffolding stent             8.  Celt closure device left femoral artery  EBL: 200 cc  Contrast: 30 cc  Fluoro Time: 9.6 minutes  Anesthesia: general              Indications:  Patient is a 73 y.o.male with profound ischemia of the right lower extremity with nonhealing ulceration and gangrenous changes. The patient had an angiogram earlier this week to anticoagulate him.  This showed thrombotic disease in the right SFA, tibioperoneal trunk, and the origins of the peroneal and posterior tibial artery likely associated with severe chronic disease.  The patient is brought in for angiography for further evaluation and potential treatment.  Due to the limb threatening nature of the situation, angiogram was performed for attempted limb salvage. The patient is aware that if the procedure fails, amputation would be expected.  The patient also understands that even with successful revascularization, amputation may still be  required due to the severity of the situation.  Risks and benefits are discussed and informed consent is obtained.   Procedure:  The patient was identified and appropriate procedural time out was performed.  The patient was then placed supine on the table and prepped and draped in the usual sterile fashion. Moderate conscious sedation was administered during a face to face encounter with the patient throughout the procedure with my supervision of the RN administering medicines and monitoring the patient's vital signs, pulse oximetry, telemetry and mental status throughout from the start of the procedure until the patient was taken to the recovery room. Ultrasound was used to evaluate the left common femoral artery.  It was patent .  A digital ultrasound image was acquired.  A Seldinger needle was used to access the left common femoral artery under direct ultrasound guidance and a permanent image was performed.  A 0.035 J wire was advanced without resistance and a 5Fr sheath was placed.   I then crossed the aortic bifurcation and advanced to the right femoral head. Selective right lower extremity angiogram was then performed. This demonstrated an occlusion in the proximal to mid SFA that was abrupt and appeared somewhat thrombotic.  There was reconstitution of the distal SFA and the popliteal artery was fairly normal.  There was also an occlusion of the tibioperoneal trunk and the proximal portion of both the peroneal and posterior tibial arteries.  The posterior tibial artery was the largest tibial vessel distally and after the proximal occlusion was continuous to the foot.  The peroneal artery was small and provided very little flow distally.  The anterior tibial artery was patent proximally but occluded and did not reconstitute distally. It was felt that it was in the patient's best interest to proceed with intervention after these images to avoid a second procedure and a larger amount of contrast and  fluoroscopy based off of the findings from the initial angiogram. The patient was systemically heparinized and a 6 French Ansell sheath was then placed over the [employer] wire. I then used a Kumpe catheter and the advantage wire to easily cross the thrombotic occlusion in the right SFA.  I then got down into the distal popliteal artery and exchanged for a command wire so I could cross the tibioperoneal trunk and proximal posterior tibial artery lesions.  This was done without difficulty with the help of the CXI catheter and the command wire and intraluminal flow was confirmed in the mid to distal posterior tibial artery.  The command wire was then replaced and I initially started with mechanical thrombectomy to treat the thrombotic disease.  3 passes were made in the right SFA and then advanced down into the tibioperoneal trunk and posterior tibial artery where 2 more passes were made.  Imaging following this showed marked improvement in the thrombotic disease but there remained atherosclerotic occlusive disease in both locations creating greater than 70% stenosis in the proximal to mid SFA, about a 80 to 90% stenosis in the tibioperoneal trunk, and subtotal occlusion in the proximal posterior tibial artery.  The posterior tibial artery and tibioperoneal trunk were addressed initially with a 3 mm diameter by 22 cm length angioplasty balloon inflated to 12 atm for 1 minute.  I elected to primarily stent the SFA and a 6 mm diameter by 15 cm length Viabahn stent was selected and deployed in the right SFA.  This was postdilated with a 5 mm diameter Lutonix drug-coated balloon.  Completion imaging showed the SFA to have no significant residual stenosis after stent placement.  The posterior tibial artery was markedly improved with less than 20% residual stenosis but there was still least a 50% residual stenosis in the tibioperoneal trunk.  I elected to stent this area.  The Esprit scaffolding system was brought on  the field and the Esprit stent was deployed in the right tibioperoneal trunk.  I selected a 3.5 mm diameter by 38 mm length Esprit stent and postdilated this with a 4 mm balloon with excellent angiographic completion result and less than 10% residual stenosis. I elected to terminate the procedure. The sheath was removed and Celt closure device was deployed in the left femoral artery with excellent hemostatic result. The patient was taken to the recovery room in stable condition having tolerated the procedure well.  Findings:                         Right Lower Extremity:  This demonstrated an occlusion in the proximal to mid SFA that was abrupt and appeared somewhat thrombotic.  There was reconstitution of the distal SFA and the popliteal artery was fairly normal.  There was also an occlusion of the tibioperoneal trunk and the proximal portion of both the peroneal and posterior tibial arteries.  The posterior tibial artery was the largest tibial vessel distally and after the proximal occlusion was continuous to the foot.  The peroneal artery was small and provided very little flow distally.  The anterior tibial artery was patent proximally but occluded and did not reconstitute distally   Disposition: Patient was taken to the recovery room in stable condition having tolerated the procedure well.  Complications: None  Selinda Gu 01/14/2024 12:29 PM   This note was created with Dragon Medical transcription system. Any errors in dictation are purely unintentional.

## 2024-01-14 NOTE — Interval H&P Note (Signed)
 History and Physical Interval Note:  01/14/2024 9:18 AM  John Booker  has presented today for surgery, with the diagnosis of Ischemia.  The various methods of treatment have been discussed with the patient and family. After consideration of risks, benefits and other options for treatment, the patient has consented to  Procedure(s): Lower Extremity Angiography (Right) as a surgical intervention.  The patient's history has been reviewed, patient examined, no change in status, stable for surgery.  I have reviewed the patient's chart and labs.  Questions were answered to the patient's satisfaction.     Madeline Bebout

## 2024-01-14 NOTE — Progress Notes (Signed)
 Initial Nutrition Assessment  DOCUMENTATION CODES:   Non-severe (moderate) malnutrition in context of chronic illness  INTERVENTION:   -Continue dysphagia 3 diet; RD will follow for diet advancement and adjust supplement regimen as appropriate  -MVI with minerals daily -500 mg vitamin C  BID -220 mg zinc  sulfate daily x 14 days -Ensure Plus High Protein po TID, each supplement provides 350 kcal and 20 grams of protein  -Magic cup TID with meals, each supplement provides 290 kcal and 9 grams of protein  -30 ml Prosource Plus TID, each supplement provides 100 kcals and 15 grams protein -RD will draw labs to assess for potential micronutrient deficiencies which could impede wound healing: vitamin A , copper, and CRP (to interpret lab values)  -Agree with palliative care consult. If aligns with goals of care, recommend alternatives means of nutrition/ hydration (NGT vs PEG). Recommend:   Initiate Osmolite 1.5 @ 20 ml/hr  and increase by 10 ml every 12 hours to goal rate of 60 ml/hr.   60 ml Prosource TF20 BID  Tube feeding regimen provides 2320 kcal (1400% of needs), 130 grams of protein, and 1097 ml of H2O.    -If feeding are initiated, recommend: 100 mg thiamine  daily x 7 days and monitor Mg, K, and Phos and replete as needed secondary to high refeeding risk   NUTRITION DIAGNOSIS:   Moderate Malnutrition related to chronic illness (dysphagia secondary to stricture/ achalasia) as evidenced by mild fat depletion, moderate fat depletion, mild muscle depletion, moderate muscle depletion, edema, percent weight loss.  GOAL:   Patient will meet greater than or equal to 90% of their needs  MONITOR:   PO intake, Supplement acceptance, Diet advancement  REASON FOR ASSESSMENT:   Consult Assessment of nutrition requirement/status  ASSESSMENT:   73 y.o. male with Past medical history of HTN, HFpEF, CKD stage II, lymphedema of bilateral lower extremities, esophageal stricture s/p  dilatation, chronic hiccups as reviewed from EMR, presented with complaining of bilateral lower extremity and upper extremity edema which is gradually getting worse.  Patient admitted with anasarca, cellulitis, of bilateral lower and upper extremities, and ischemia right first toe ulcer and ischemia of third toe (possible underlying PAD).   11/3- s/p vascular procedure 11/4- s/p EGD- revealed benign appearing esophageal stenosis 11/5- s/p BSE- dysphagia 3 diet with thin liquids 11/6- s/p Lower Extremity Angiography (Right)  Reviewed I/O's: -700 ml x 24 hours and -3.7 L since admission  UOP: 700 ml x 24 hours  Per [facility] notes, patient with full thickness ulcerations to right foot (toe tips and third right toe).   Per podiatry notes, patient had gangrenous changes to the right forefoot and venous ulceration to the left lower leg on admission.   Per GI notes, patient with history of esophageal stricture s/p dilation in 07/2023.  Patient underwent EGD, which revealed mild stricture and abnormal barium swallow, suggesting achalasia.    Case discussed with RN, who reports patient just came back from vascular procedure and has been NPO all shift. She shares that diet order has been placed. She also confirms that patient has been having swallowing difficulty for years. Patient was able to take medications with applesauce without difficulty.   Spoke with patient at bedside, who was groggy from procedure. He states he is hungry and requests a [employer] cheeseburger. He also endorses swallowing difficulty, poor oral intake due to this, and weight loss secondary to poor oral intake. Noted meal completions 30-50%.   Patient endorses a 50 pound weight loss over  the past few months. Reviewed weight history; he has experienced a 14.2% weight loss over the past 6 months, which is significant for time frame. Patient with mild edema, which may be masking further weight loss as well as further fat and muscle  depletions.   Case discussed extensively with SLP, who requested RD evaluate the patient. Patient continues to have poor oral intake with coughing at meals. She shares that this issue is chronic and likely not treatable; she reports family reports he was seen at [facility] and was given the same prognosis. Per SLP, swallowing issues are related to esophageal dysmotility. Given his co-morbidities and gangrenous toes, anticipate continued decline if unable to meet nutritional needs. RD does not suspect patient will be able to meet needs orally and has been identified with malnutrition. Palliative care has been consulted for goals of care. May need to consider artifical means of nutrition/hydration if patient desires full scope care.   Recommendations discussed with team.   Medications reviewed and include vitamin C , vitamin D3, folic acid , MVI, and thiamine .   Labs reviewed: CBGS: 143 (inpatient orders for glycemic control are none).    NUTRITION - FOCUSED PHYSICAL EXAM:  Flowsheet Row Most Recent Value  Orbital Region Mild depletion  Upper Arm Region Moderate depletion  Thoracic and Lumbar Region Mild depletion  Buccal Region Mild depletion  Temple Region Mild depletion  Clavicle Bone Region Mild depletion  Clavicle and Acromion Bone Region Mild depletion  Scapular Bone Region Mild depletion  Dorsal Hand Moderate depletion  Patellar Region Mild depletion  Anterior Thigh Region Mild depletion  Posterior Calf Region Mild depletion  Edema (RD Assessment) Moderate  Hair Reviewed  Eyes Reviewed  Mouth Reviewed  Skin Reviewed  Nails Reviewed    Diet Order:   Diet Order             DIET DYS 3 Room service appropriate? Yes; Fluid consistency: Thin  Diet effective now                   EDUCATION NEEDS:   Not appropriate for education at this time  Skin:  Skin Assessment: Skin Integrity Issues: Skin Integrity Issues:: Other (Comment) Other: full thickness ulcerations to right foot  (toe tips and third right toe)  Last BM:  01/14/24 (type 5)  Height:   Ht Readings from Last 1 Encounters:  01/14/24 6' 3 (1.905 m)    Weight:   Wt Readings from Last 1 Encounters:  01/14/24 74.9 kg    Ideal Body Weight:  89.1 kg  BMI:  Body mass index is 20.64 kg/m.  Estimated Nutritional Needs:   Kcal:  2250-2450  Protein:  120-135 grams  Fluid:  2.0-2.2 L    Margery ORN, RD, LDN, CDCES Registered Dietitian III Certified Diabetes Care and Education Specialist If unable to reach this RD, please use RD Inpatient group chat on secure chat between hours of 8am-4 pm daily

## 2024-01-14 NOTE — Progress Notes (Signed)
 Daily Progress Note   Subjective  - * Day of Surgery *  Follow-up gangrene right great toe and third toe.  Patient underwent revascularization earlier today.  Resting comfortably.  No complaints at this time.  Patient did not remember conversation from earlier in the week that we had.  Objective Vitals:   01/14/24 1315 01/14/24 1330 01/14/24 1406 01/14/24 1513  BP: 138/74 (!) 142/67 135/87 125/68  Pulse: 76 73  91  Resp: (!) 21 11 19 20   Temp:  (!) 97.2 F (36.2 C) 97.8 F (36.6 C) (!) 97.5 F (36.4 C)  TempSrc:   Oral Oral  SpO2: 95% 100% 99% 99%  Weight:      Height:        Physical Exam: The distal aspect of the right great toe and distal two thirds of the right third toe have noted gangrenous changes.  Currently this is dry gangrene.  There is a small superficial ulcer on the dorsal aspect of the right foot.  Left leg has cellulitic venous ulceration.  Noted edema to bilateral lower extremities.  Laboratory CBC    Component Value Date/Time   WBC 6.5 01/08/2024 0405   HGB 10.7 (L) 01/08/2024 0405   HGB 15.7 04/15/2012 2049   HCT 32.8 (L) 01/08/2024 0405   HCT 47.7 04/15/2012 2049   PLT 230 01/08/2024 0405   PLT 175 04/15/2012 2049    BMET    Component Value Date/Time   NA 132 (L) 01/12/2024 0355   NA 135 (L) 04/15/2012 2049   K 3.5 01/12/2024 0355   K 3.5 04/15/2012 2049   CL 95 (L) 01/12/2024 0355   CL 98 04/15/2012 2049   CO2 29 01/12/2024 0355   CO2 30 04/15/2012 2049   GLUCOSE 85 01/12/2024 0355   GLUCOSE 107 (H) 04/15/2012 2049   BUN 22 01/12/2024 0355   BUN 17 04/15/2012 2049   CREATININE 0.79 01/12/2024 0355   CREATININE 1.15 12/20/2020 0946   CALCIUM 8.4 (L) 01/12/2024 0355   CALCIUM 8.5 04/15/2012 2049   GFRNONAA >60 01/12/2024 0355   GFRNONAA 73 12/20/2019 0916   GFRAA 85 12/20/2019 0916    Assessment/Planning: Gangrene right great toe and third toe Severe peripheral vascular disease  At this time we are planning for surgical invention  for amputation partially of the great toe and amputation of the third toe to remove the gangrenous changes.  I discussed the risk benefits alternatives complications associated with surgery and patient is given consent for surgery tomorrow.  Orders will be placed.  I will see him at the time of surgery tomorrow morning.   Ashley Soulier A  01/14/2024, 4:59 PM

## 2024-01-14 NOTE — Progress Notes (Signed)
 Progress Note   Patient: John Booker FMW:[id number] DOB: [birth year] DOA: 01/07/2024     7 DOS: the patient was seen and examined on 01/14/2024   Brief hospital course: 73 y.o. male with Past medical history of HTN, HFpEF, CKD stage II, lymphedema of bilateral lower extremities, esophageal stricture s/p dilatation, chronic hiccups as reviewed from EMR, presented at [facility] ED with complaining of bilateral lower extremity and upper extremity edema which is gradually getting worse.  Patient is not a very good historian, as per patient's daughter lower extremity edema is gradually getting worse for the past 1 week and it is weeping.  There is an area of erythema in the left lower extremity on the lateral side which he scratched maybe had trauma.  Patient reported no fever.  While patient was getting ready to come to the ED, he fell and landed on his buttocks, no LOC.  Denies any pain after fall.   11/3.  Vascular surgery brought to the procedure suite for angiogram.  No intervention was done secondary to the patient needing an endoscopy and needing to be off anticoagulation.  The patient does have peripheral vascular disease.  Extensive lower extremity revascularization at a later date. 11/4.  EGD was done by Dr. Jinny and a benign-appearing esophageal stenosis was dilated. 11/5.  Patient still having trouble eating.  Case discussed with gastroenterology and will get speech pathology to evaluate for potential barium swallow.  May need manometry as outpatient.  Assessment and Plan: * Anasarca Initially was started on Lasix  drip.  Monitoring off diuretic today and likely restart tomorrow  PVD (peripheral vascular disease) Patient had mechanical thrombectomy of the right SFA, tibioperoneal trunk and posterior tibial artery, stent placement to right SFA, angioplasty of right posterior tibial artery and tibioperoneal trunk, stent placement right tibioperoneal trunk.  Cellulitis of left lower  extremity Cellulitis of bilateral lower extremities.  Gangrene right third toe and part of the first toe,  Left foot ulcer.  Also on the left foot areas of scabs which could be starting of gangrene.  Patient received vancomycin  and Rocephin  and switched over to Augmentin .  Patient's daughter would like to watch for the gangrene at this point rather than amputation.  Esophageal dysphagia Continue PPI.  EGD with dilation on 11/4.  Appreciate speech pathology consultation.  Continue Protonix .  May end up needing outpatient manometry.  Stricture of esophagus Dilated on 11/4.  Continue PPI.  Hypomagnesemia Treated  AKI (acute kidney injury) Resolved.  Hypokalemia Treated  Hyponatremia Sodium 132   Will get palliative care consultation     Subjective: Patient does not remember speaking with the speech pathologist yesterday.  Advised he needs to eat slowly and wash things down with liquids.  Patient to go to angiogram for lower extremity circulation today.  Admitted with cellulitis and gangrene.  Physical Exam: Vitals:   01/14/24 0848 01/14/24 1230 01/14/24 1235 01/14/24 1245  BP: 128/68 (!) 144/68 (!) 144/60 134/67  Pulse: 78 74 73 72  Resp: 17 11 12 11   Temp: 97.6 F (36.4 C) (!) 97.4 F (36.3 C) 98.6 F (37 C)   TempSrc: Oral  Tympanic   SpO2: 98% 100% 100% 100%  Weight: 74.9 kg     Height: 6' 3 (1.905 m)      Physical Exam HENT:     Head: Normocephalic.     Mouth/Throat:     Pharynx: No oropharyngeal exudate.  Eyes:     General: Lids are normal.  Conjunctiva/sclera: Conjunctivae normal.  Cardiovascular:     Rate and Rhythm: Normal rate and regular rhythm.     Heart sounds: Normal heart sounds, S1 normal and S2 normal.  Pulmonary:     Breath sounds: No decreased breath sounds, wheezing, rhonchi or rales.  Abdominal:     Palpations: Abdomen is soft.     Tenderness: There is no abdominal tenderness.  Musculoskeletal:     Right lower leg: Swelling present.      Left lower leg: Swelling present.  Skin:    General: Skin is warm.     Comments: Gangrene third right toe, tip of first toe right foot.  Dark looking scabs on left foot which could be the starting of gangrene.  Ulcer left foot.  Neurological:     Mental Status: He is alert.     Data Reviewed: I once again 6.5, hemoglobin 10.7, platelet count 230, sodium 132, creatinine 0.79 Family Communication: Updated patient's daughter on the phone  Disposition: Status is: Inpatient Remains inpatient appropriate because: For vascular procedure today  Planned Discharge Destination: Home with Home Health (physical therapy recommending rehab but at this point patient is refusing) patient's daughter will talk to him.    Time spent: 28 minutes  Author: Charlie Patterson, MD 01/14/2024 1:01 PM  For on call review [email or web address].

## 2024-01-14 NOTE — Transfer of Care (Signed)
 Immediate Anesthesia Transfer of Care Note  Patient: John Booker  Procedure(s) Performed: Lower Extremity Angiography (Right)  Patient Location: PACU  Anesthesia Type:General  Level of Consciousness: drowsy  Airway & Oxygen Therapy: Patient Spontanous Breathing and Patient connected to nasal cannula oxygen  Post-op Assessment: Report given to RN and Post -op Vital signs reviewed and stable  Post vital signs: Reviewed and stable  Last Vitals:  Vitals Value Taken Time  BP 144/60 01/14/24 12:35  Temp 37 C 01/14/24 12:35  Pulse 73 01/14/24 12:35  Resp 12 01/14/24 12:35  SpO2 100 % 01/14/24 12:35    Last Pain:  Vitals:   01/14/24 1235  TempSrc: Tympanic  PainSc:       Patients Stated Pain Goal: 0 (01/09/24 1424)  Complications: There were no known notable events for this encounter.

## 2024-01-14 NOTE — Progress Notes (Signed)
 OT Cancellation Note  Patient Details Name: John Booker MRN: [id number] DOB: 09-Nov-1950   Cancelled Treatment:    Reason Eval/Treat Not Completed: Patient at procedure or test/ unavailable;Other (comment) (procedure with vascular)  John Booker Clause 01/14/2024, 4:00 PM

## 2024-01-15 ENCOUNTER — Inpatient Hospital Stay: Admitting: Certified Registered"

## 2024-01-15 ENCOUNTER — Encounter
Admission: EM | Disposition: A | Discharge status: Skilled Nursing Facility | Payer: Self-pay | Source: Home / Self Care | Attending: Internal Medicine

## 2024-01-15 ENCOUNTER — Inpatient Hospital Stay

## 2024-01-15 DIAGNOSIS — R601 Generalized edema: Secondary | ICD-10-CM | POA: Diagnosis not present

## 2024-01-15 DIAGNOSIS — R1319 Other dysphagia: Secondary | ICD-10-CM | POA: Diagnosis not present

## 2024-01-15 DIAGNOSIS — Z66 Do not resuscitate: Secondary | ICD-10-CM

## 2024-01-15 DIAGNOSIS — R600 Localized edema: Principal | ICD-10-CM

## 2024-01-15 DIAGNOSIS — L03116 Cellulitis of left lower limb: Secondary | ICD-10-CM | POA: Diagnosis not present

## 2024-01-15 DIAGNOSIS — Z95828 Presence of other vascular implants and grafts: Secondary | ICD-10-CM

## 2024-01-15 DIAGNOSIS — Z7189 Other specified counseling: Secondary | ICD-10-CM

## 2024-01-15 DIAGNOSIS — K222 Esophageal obstruction: Secondary | ICD-10-CM | POA: Diagnosis not present

## 2024-01-15 DIAGNOSIS — Z515 Encounter for palliative care: Secondary | ICD-10-CM

## 2024-01-15 DIAGNOSIS — I96 Gangrene, not elsewhere classified: Secondary | ICD-10-CM | POA: Diagnosis not present

## 2024-01-15 DIAGNOSIS — Z9889 Other specified postprocedural states: Secondary | ICD-10-CM

## 2024-01-15 HISTORY — PX: AMPUTATION TOE: SHX6595

## 2024-01-15 LAB — BASIC METABOLIC PANEL WITH GFR
Anion gap: 8 (ref 5–15)
BUN: 23 mg/dL (ref 8–23)
CO2: 30 mmol/L (ref 22–32)
Calcium: 8.3 mg/dL — ABNORMAL LOW (ref 8.9–10.3)
Chloride: 98 mmol/L (ref 98–111)
Creatinine, Ser: 0.84 mg/dL (ref 0.61–1.24)
GFR, Estimated: >60 mL/min (ref >60)
Glucose, Bld: 114 mg/dL — ABNORMAL HIGH (ref 70–99)
Potassium: 4.2 mmol/L (ref 3.5–5.1)
Sodium: 136 mmol/L (ref 135–145)

## 2024-01-15 LAB — CBC
HCT: 34.1 % — ABNORMAL LOW (ref 39.0–52.0)
Hemoglobin: 10.9 g/dL — ABNORMAL LOW (ref 13.0–17.0)
MCH: 31.5 pg (ref 26.0–34.0)
MCHC: 32 g/dL (ref 30.0–36.0)
MCV: 98.6 fL (ref 80.0–100.0)
Platelets: 283 K/uL (ref 150–400)
RBC: 3.46 MIL/uL — ABNORMAL LOW (ref 4.22–5.81)
RDW: 12 % (ref 11.5–15.5)
WBC: 7.4 K/uL (ref 4.0–10.5)
nRBC: 0 % (ref 0.0–0.2)

## 2024-01-15 SURGERY — AMPUTATION, TOE
Anesthesia: General | Site: Toe | Laterality: Right

## 2024-01-15 MED ORDER — BUPIVACAINE HCL 0.5 % IJ SOLN
INTRAMUSCULAR | Status: DC | PRN
  Administered 2024-01-15: 10 mL

## 2024-01-15 MED ORDER — DEXMEDETOMIDINE HCL IN NACL 80 MCG/20ML IV SOLN
INTRAVENOUS | Status: DC | PRN
  Administered 2024-01-15: 8 ug via INTRAVENOUS

## 2024-01-15 MED ORDER — PROPOFOL 1000 MG/100ML IV EMUL
INTRAVENOUS | Status: AC
  Filled 2024-01-15: qty 100

## 2024-01-15 MED ORDER — CEFAZOLIN SODIUM-DEXTROSE 2-4 GM/100ML-% IV SOLN
INTRAVENOUS | Status: AC
  Filled 2024-01-15: qty 100

## 2024-01-15 MED ORDER — 0.9 % SODIUM CHLORIDE (POUR BTL) OPTIME
TOPICAL | Status: DC | PRN
  Administered 2024-01-15: 500 mL

## 2024-01-15 MED ORDER — ATORVASTATIN CALCIUM 10 MG PO TABS
10.0000 mg | ORAL_TABLET | Freq: Every day | ORAL | Status: DC
Start: 2024-01-15 — End: 2024-01-21
  Administered 2024-01-15 – 2024-01-20 (×6): 10 mg via ORAL
  Filled 2024-01-15 (×7): qty 1

## 2024-01-15 MED ORDER — VITAMIN D (ERGOCALCIFEROL) 1.25 MG (50000 UNIT) PO CAPS
50000.0000 [IU] | ORAL_CAPSULE | ORAL | Status: DC
Start: 2024-01-16 — End: 2024-01-21
  Administered 2024-01-16: 50000 [IU] via ORAL
  Filled 2024-01-15: qty 1

## 2024-01-15 MED ORDER — ASPIRIN 81 MG PO TBEC
81.0000 mg | DELAYED_RELEASE_TABLET | Freq: Every day | ORAL | Status: DC
  Administered 2024-01-15 – 2024-01-21 (×7): 81 mg via ORAL
  Filled 2024-01-15 (×7): qty 1

## 2024-01-15 MED ORDER — FENTANYL CITRATE (PF) 100 MCG/2ML IJ SOLN: 25.0000 ug | INTRAMUSCULAR | Status: DC | PRN

## 2024-01-15 MED ORDER — OXYCODONE HCL 5 MG PO TABS
5.0000 mg | ORAL_TABLET | Freq: Once | ORAL | Status: AC | PRN
  Administered 2024-01-15: 5 mg via ORAL

## 2024-01-15 MED ORDER — PROPOFOL 500 MG/50ML IV EMUL
INTRAVENOUS | Status: DC | PRN
Start: 2024-01-15 — End: 2024-01-15
  Administered 2024-01-15 (×6): 10 mg via INTRAVENOUS
  Administered 2024-01-15: 20 mg via INTRAVENOUS
  Administered 2024-01-15 (×2): 10 mg via INTRAVENOUS

## 2024-01-15 MED ORDER — DIPHENHYDRAMINE HCL 25 MG PO CAPS
25.0000 mg | ORAL_CAPSULE | Freq: Three times a day (TID) | ORAL | Status: DC | PRN
  Administered 2024-01-15: 25 mg via ORAL
  Filled 2024-01-15: qty 1

## 2024-01-15 MED ORDER — CLOPIDOGREL BISULFATE 75 MG PO TABS: 75.0000 mg | ORAL_TABLET | Freq: Every day | ORAL | Status: DC

## 2024-01-15 MED ORDER — LIDOCAINE HCL (CARDIAC) PF 100 MG/5ML IV SOSY
PREFILLED_SYRINGE | INTRAVENOUS | Status: DC | PRN
  Administered 2024-01-15: 40 mg via INTRAVENOUS

## 2024-01-15 MED ORDER — OXYCODONE HCL 5 MG PO TABS
ORAL_TABLET | ORAL | Status: AC
  Filled 2024-01-15: qty 1

## 2024-01-15 MED ORDER — BUPIVACAINE HCL (PF) 0.25 % IJ SOLN
INTRAMUSCULAR | Status: AC
  Filled 2024-01-15: qty 30

## 2024-01-15 MED ORDER — EPHEDRINE 5 MG/ML INJ
INTRAVENOUS | Status: AC
  Filled 2024-01-15: qty 5

## 2024-01-15 MED ORDER — ASPIRIN 81 MG PO TBEC: 81.0000 mg | DELAYED_RELEASE_TABLET | Freq: Every day | ORAL | Status: DC

## 2024-01-15 MED ORDER — OXYCODONE HCL 5 MG/5ML PO SOLN: 5.0000 mg | Freq: Once | ORAL | Status: AC | PRN

## 2024-01-15 MED ORDER — LACTATED RINGERS IV SOLN: INTRAVENOUS | Status: DC | PRN

## 2024-01-15 MED ORDER — LIDOCAINE HCL (PF) 2 % IJ SOLN
INTRAMUSCULAR | Status: AC
  Filled 2024-01-15: qty 5

## 2024-01-15 MED ORDER — FUROSEMIDE 20 MG PO TABS
20.0000 mg | ORAL_TABLET | Freq: Every day | ORAL | Status: DC
  Administered 2024-01-16 – 2024-01-18 (×3): 20 mg via ORAL
  Filled 2024-01-15 (×3): qty 1

## 2024-01-15 MED ORDER — CLOPIDOGREL BISULFATE 75 MG PO TABS
75.0000 mg | ORAL_TABLET | Freq: Every day | ORAL | Status: DC
  Administered 2024-01-15 – 2024-01-21 (×7): 75 mg via ORAL
  Filled 2024-01-15 (×7): qty 1

## 2024-01-15 SURGICAL SUPPLY — 48 items
BLADE OSC/SAGITTAL MD 5.5X18 (BLADE) ×1 IMPLANT
BLADE SURG 15 STRL LF DISP TIS (BLADE) IMPLANT
BLADE SURG MINI STRL (BLADE) ×1 IMPLANT
BNDG COHESIVE 4X5 TAN STRL LF (GAUZE/BANDAGES/DRESSINGS) ×1 IMPLANT
BNDG ELASTIC 4X5.8 VLCR NS LF (GAUZE/BANDAGES/DRESSINGS) ×1 IMPLANT
BNDG ESMARCH 4X12 STRL LF (GAUZE/BANDAGES/DRESSINGS) ×1 IMPLANT
BNDG GAUZE DERMACEA FLUFF 4 (GAUZE/BANDAGES/DRESSINGS) ×1 IMPLANT
BNDG STRETCH GAUZE 3IN X12FT (GAUZE/BANDAGES/DRESSINGS) ×2 IMPLANT
CUFF TOURN SGL QUICK 12 (TOURNIQUET CUFF) IMPLANT
CUFF TOURN SGL QUICK 18X4 (TOURNIQUET CUFF) IMPLANT
DRAPE FLUOR MINI C-ARM 54X84 (DRAPES) ×1 IMPLANT
DRAPE XRAY CASSETTE 23X24 (DRAPES) IMPLANT
DRSG AQUACEL AG ADV 3.5X 4 (GAUZE/BANDAGES/DRESSINGS) IMPLANT
DRSG MEPILEX FLEX 3X3 (GAUZE/BANDAGES/DRESSINGS) IMPLANT
DRSG MEPILEX SACRM 8.7X9.8 (GAUZE/BANDAGES/DRESSINGS) IMPLANT
DURAPREP 26ML APPLICATOR (WOUND CARE) ×1 IMPLANT
ELECTRODE REM PT RTRN 9FT ADLT (ELECTROSURGICAL) ×1 IMPLANT
GAUZE PACKING IODOFORM 1/2INX (GAUZE/BANDAGES/DRESSINGS) ×1 IMPLANT
GAUZE SPONGE 4X4 12PLY STRL (GAUZE/BANDAGES/DRESSINGS) ×1 IMPLANT
GAUZE STRETCH 2X75IN STRL (MISCELLANEOUS) ×1 IMPLANT
GAUZE XEROFORM 1X8 LF (GAUZE/BANDAGES/DRESSINGS) ×1 IMPLANT
GLOVE BIO SURGEON STRL SZ7.5 (GLOVE) ×1 IMPLANT
GLOVE BIOGEL PI IND STRL 8 (GLOVE) ×1 IMPLANT
GOWN STRL REUS W/ TWL XL LVL3 (GOWN DISPOSABLE) ×1 IMPLANT
GOWN STRL REUS W/TWL XL LVL4 (GOWN DISPOSABLE) ×1 IMPLANT
KIT TURNOVER KIT A (KITS) ×1 IMPLANT
LABEL OR SOLS (LABEL) ×1 IMPLANT
MANIFOLD NEPTUNE II (INSTRUMENTS) ×1 IMPLANT
NDL HYPO 25X1 1.5 SAFETY (NEEDLE) ×1 IMPLANT
NDL SAFETY ECLIP 18X1.5 (MISCELLANEOUS) ×1 IMPLANT
NEEDLE HYPO 25X1 1.5 SAFETY (NEEDLE) ×1 IMPLANT
NS IRRIG 500ML POUR BTL (IV SOLUTION) ×1 IMPLANT
PACK EXTREMITY ARMC (MISCELLANEOUS) ×1 IMPLANT
PAD ABD DERMACEA PRESS 5X9 (GAUZE/BANDAGES/DRESSINGS) ×2 IMPLANT
PENCIL SMOKE EVACUATOR (MISCELLANEOUS) ×1 IMPLANT
SHIELD FULL FACE ANTIFOG 7M (MISCELLANEOUS) ×1 IMPLANT
SLEEVE PROTECTION STRL DISP (MISCELLANEOUS) IMPLANT
SOL .9 NS 3000ML IRR UROMATIC (IV SOLUTION) ×1 IMPLANT
SOLUTION PREP PVP 2OZ (MISCELLANEOUS) IMPLANT
STOCKINETTE IMPERV 14X48 (MISCELLANEOUS) ×1 IMPLANT
STOCKINETTE M/LG 89821 (MISCELLANEOUS) ×1 IMPLANT
STRAP SAFETY 5IN WIDE (MISCELLANEOUS) ×1 IMPLANT
SUT ETHILON 5-0 FS-2 18 BLK (SUTURE) ×1 IMPLANT
SUTURE EHLN 3-0 FS-10 30 BLK (SUTURE) ×1 IMPLANT
SYR 10ML LL (SYRINGE) ×3 IMPLANT
TIP FAN IRRIG PULSAVAC PLUS (DISPOSABLE) ×1 IMPLANT
TRAP FLUID SMOKE EVACUATOR (MISCELLANEOUS) ×1 IMPLANT
WATER STERILE IRR 500ML POUR (IV SOLUTION) ×1 IMPLANT

## 2024-01-15 NOTE — TOC Progression Note (Signed)
 Transition of Care [facility]) - Progression Note    Patient Details  Name: John Booker MRN: [id number] Date of Birth: [birth year]  [facility] CM/SW Contact  Lauraine JAYSON Carpen, LCSW Phone Number: 01/15/2024, 1:07 PM  Clinical Narrative:  CSW sent out home health referral.   Expected Discharge Plan: Home w Home Health Services Barriers to Discharge: Continued Medical Work up               Expected Discharge Plan and Services     Post Acute Care Choice: Home Health Living arrangements for the past 2 months: Single Family Home                                       Social Drivers of Health (SDOH) Interventions SDOH Screenings   Food Insecurity: No Food Insecurity (01/07/2024)  Housing: Low Risk  (01/07/2024)  Transportation Needs: No Transportation Needs (01/07/2024)  Utilities: Not At Risk (01/07/2024)  Depression (PHQ2-9): Low Risk  (12/12/2021)  Financial Resource Strain: Low Risk  (05/07/2023)   Received from [facility]  Social Connections: Socially Isolated (01/07/2024)  Tobacco Use: Medium Risk (01/14/2024)    Readmission Risk Interventions    07/13/2023   12:44 PM  Readmission Risk Prevention Plan  Transportation Screening Complete  PCP or Specialist Appt within 5-7 Days Complete  Medication Review (RN CM) Complete

## 2024-01-15 NOTE — Progress Notes (Signed)
 OT Cancellation Note  Patient Details Name: John Booker MRN: [id number] DOB: 23-Oct-1950   Cancelled Treatment:    Reason Eval/Treat Not Completed: Other (comment). Pt off unit for surgery today. OT to follow up as appropriate.   Izetta Claude, MS, OTR/L , CBIS ascom [phone]  01/15/24, 10:37 AM

## 2024-01-15 NOTE — Progress Notes (Signed)
 PT Cancellation Note  Patient Details Name: John Booker MRN: [id number] DOB: 12-20-1950   Cancelled Treatment:    Reason Eval/Treat Not Completed: Patient declined, no reason specified;Patient at procedure or test/unavailable. Pt received upright in bed. Pt and PT agree to holding PT session as pt is pending toe amputation today. Pt reports pain and requesting to hold off until after procedure due to inability to have any meds prior. PT to re-attempt at a later time/date.    Dorina HERO. Fairly IV, PT, DPT Physical Therapist- [place]  [facility] 01/15/2024, 10:12 AM

## 2024-01-15 NOTE — Op Note (Signed)
 Operative note   Surgeon:K[employer]i Mars[employer]/support/administrative Officer: None    Preop diagnosis: 1.  Gangrene right great toe 2.  Gangrene right third toe 3.  Ulceration plantar right fifth MTPJ    Postop diagnosis: Same    Procedure: 1.  Amputation interphalangeal joint right great toe 2.  Amputation metatarsophalangeal joint right third toe 3.  Full-thickness debridement to subcutaneous tissue plantar right fifth MTPJ ulcer    EBL: Minimal    Anesthesia: IV sedation with local.  Local consists of a total of 10 cc of 0.5% plain bupivacaine    Hemostasis: None    Specimen: Gangrenous great toe and gangrenous third toe right foot    Complications: None    Operative indications:Mithran K Kurtz is an 73 y.o. that presents today for surgical intervention.  The risks/benefits/alternatives/complications have been discussed and consent has been given.    Procedure:  Patient was brought into the OR and placed on the operating table in thesupine position. After anesthesia was obtained theright lower extremity was prepped and draped in usual sterile fashion.  Attention was directed to the right foot where initial incision was placed at the interphalange joint of the great toe.  2 full-thickness flaps were created dorsal to plantar.  The distal phalanx was disarticulated and sent for pathological examination.  Good perfusion of the skin flap was noted at this time.  Attention was then directed to the right third toe where a full-thickness skin flaps were created at the base of the toe.  This was taken down to the metatarsophalangeal joint.  At this time with a power saw I removed just distal to the proximal aspect of the proximal phalanx of the toe and this was sent for pathological examination.  Wounds were flushed.  Remodeling of the distal portion of the proximal phalanx was performed with a power saw.  After final flush the skin was then closed with remodeling of the skin performed.  Attention was directed to  the plantar aspect of the right fifth MTPJ where a full-thickness ulcer was noted.  Predebridement measurements was covered with nonviable tissue and only 4 mm.  Postdebridement was 1 cm x 1 cm x 0.5 cm.  Tissue removed was nonviable tissue down to and including the subcutaneous tissue with a 15 blade.  At this time padded dressings were applied to all areas.    Patient tolerated the procedure and anesthesia well.  Was transported from the OR to the PACU with all vital signs stable and vascular status intact. To be discharged per routine protocol.

## 2024-01-15 NOTE — Plan of Care (Signed)
   Problem: Education: Goal: Knowledge of General Education information will improve Description Including pain rating scale, medication(s)/side effects and non-pharmacologic comfort measures Outcome: Progressing

## 2024-01-15 NOTE — H&P (Signed)
 HISTORY AND PHYSICAL INTERVAL NOTE:  01/15/2024  10:58 AM  John Booker  has presented today for surgery, with the diagnosis of osteomyelitis.  The various methods of treatment have been discussed with the patient.  No guarantees were given.  After consideration of risks, benefits and other options for treatment, the patient has consented to surgery.  I have reviewed the patients' chart and labs.    The patient was reexamined.  There have been no changes to this history and physical examination.  Ashley Soulier A

## 2024-01-15 NOTE — TOC Progression Note (Signed)
 Transition of Care [facility]) - Progression Note    Patient Details  Name: John Booker MRN: [id number] Date of Birth: 01-20-1951  [facility] of [facility]) CM/SW Contact  Corean ONEIDA Haddock, RN Phone Number: 01/15/2024, 3:41 PM  Clinical Narrative:     Notified by MD that per daughter patient agreeable to SNF Met with patient and daughter John Booker.  Inquired if patient was interested in SNF he states do what ever she wants and points to daughter I let patient know that he would also have to be in agreement.  He states go with whatever she decides  Helmut confirms that she would like a bed search initiated  Existing PASRR Fl2 sent for signature Bed search initiated   Expected Discharge Plan: Home w Home Health Services Barriers to Discharge: Continued Medical Work up               Expected Discharge Plan and Services     Post Acute Care Choice: Home Health Living arrangements for the past 2 months: Single Family Home                                       Social Drivers of Health (SDOH) Interventions SDOH Screenings   Food Insecurity: No Food Insecurity (01/07/2024)  Housing: Low Risk  (01/07/2024)  Transportation Needs: No Transportation Needs (01/07/2024)  Utilities: Not At Risk (01/07/2024)  Depression (PHQ2-9): Low Risk  (12/12/2021)  Financial Resource Strain: Low Risk  (05/07/2023)   Received from [facility]  Social Connections: Socially Isolated (01/07/2024)  Tobacco Use: Medium Risk (01/14/2024)    Readmission Risk Interventions    07/13/2023   12:44 PM  Readmission Risk Prevention Plan  Transportation Screening Complete  PCP or Specialist Appt within 5-7 Days Complete  Medication Review (RN CM) Complete

## 2024-01-15 NOTE — NC FL2 (Signed)
 [place]  MEDICAID FL2 LEVEL OF CARE FORM     IDENTIFICATION  Patient Name: John Booker Birthdate: 1950/12/27 Sex: male Admission Date (Current Location): 01/07/2024  [facility] and [place] Number:  Chiropodist and Address:         Provider Number: [id number]  Attending Physician Name and Address:  Josette Ade, MD  Relative Name and Phone Number:       Current Level of Care: Hospital Recommended Level of Care: Skilled Nursing Facility Prior Approval Number:    Date Approved/Denied:   PASRR Number: [id number]  Discharge Plan: SNF    Current Diagnoses: Patient Active Problem List   Diagnosis Date Noted   Peripheral edema 01/15/2024   PAD (peripheral artery disease) 01/14/2024   Gangrene (HCC) 01/13/2024   Stricture of esophagus 01/12/2024   Esophageal dysphagia 01/11/2024   Cellulitis of left lower extremity 01/08/2024   Anasarca 01/07/2024   Acquired achalasia of esophagus - s/p esophageal dilatation 07-14-2023 07/15/2023   DNR (do not resuscitate)/DNI(Do NOT Intubate) 07/15/2023   Hypophosphatemia 07/12/2023   Hypomagnesemia 07/12/2023   Weakness 12/13/2022   Serum total bilirubin elevated 12/13/2022   Benign paraspinal mass 11/20/2021   GERD without esophagitis 11/16/2021   Hypokalemia 11/16/2021   Hyponatremia 11/16/2021   AKI (acute kidney injury) 11/15/2021   Stage 3a chronic kidney disease (HCC) 11/07/2021   Protrusion of cervical intervertebral disc 07/23/2021   Exertional dyspnea 07/04/2021   History of alcohol abuse 07/04/2021   History of tobacco abuse 07/04/2021   Chronic diastolic CHF (congestive heart failure) (HCC) 06/26/2021   Headache 06/26/2021   Low serum albumin 05/10/2021   Malnutrition of moderate degree 12/28/2020   Esophageal dysmotility 12/28/2020   Generalized weakness 12/26/2020   Overweight (BMI 25.0-29.9) 12/20/2019   Neuropathy 04/04/2019   Pre-ulcerative calluses 10/07/2018   Skin lesion 10/07/2018    Plantar flexed metatarsal bone of right foot 10/07/2018   Hiatal hernia    Alcohol abuse 03/06/2017   Alcohol dependence in sustained full remission (HCC) 03/05/2017   Essential hypertension 11/15/2015   Vitamin D  deficiency 11/15/2015    Orientation RESPIRATION BLADDER Height & Weight     Self, Time, Situation, Place  Normal Incontinent Weight: 74.9 kg Height:  6' 3 (190.5 cm)  BEHAVIORAL SYMPTOMS/MOOD NEUROLOGICAL BOWEL NUTRITION STATUS      Incontinent Diet (Heart Healthy)  AMBULATORY STATUS COMMUNICATION OF NEEDS Skin   Extensive Assist Verbally Skin abrasions, Surgical wounds, Bruising, Other (Comment) (blister, wounds to wrist and hands)                       Personal Care Assistance Level of Assistance              Functional Limitations Info             SPECIAL CARE FACTORS FREQUENCY  PT (By licensed PT), OT (By licensed OT)                    Contractures Contractures Info: Not present    Additional Factors Info  Code Status, Allergies Code Status Info: DNR Allergies Info: Adalat (Nifedipine), Amlodipine            Current Medications (01/15/2024):  This is the current hospital active medication list Current Facility-Administered Medications  Medication Dose Route Frequency Provider Last Rate Last Admin   (feeding supplement) PROSource Plus liquid 30 mL  30 mL Oral BID BM Fowler, Justin, DPM   30 mL at 01/14/24 1608  acetaminophen  (TYLENOL ) tablet 650 mg  650 mg Oral Q6H PRN Dew, Jason S, MD       Or   acetaminophen  (TYLENOL ) suppository 650 mg  650 mg Rectal Q6H PRN Dew, Jason S, MD       amoxicillin -clavulanate (AUGMENTIN ) 875-125 MG per tablet 1 tablet  1 tablet Oral Q12H Dew, Jason S, MD   1 tablet at 01/14/24 2125   ascorbic acid  (VITAMIN C ) tablet 500 mg  500 mg Oral BID Ashley Soulier, DPM   500 mg at 01/14/24 2125   aspirin  EC tablet 81 mg  81 mg Oral Daily Dew, Jason S, MD   81 mg at 01/15/24 1427   atorvastatin (LIPITOR) tablet  10 mg  10 mg Oral Daily Dew, Jason S, MD   10 mg at 01/15/24 1427   bisacodyl (DULCOLAX) EC tablet 10 mg  10 mg Oral Daily PRN Dew, Jason S, MD       clopidogrel (PLAVIX) tablet 75 mg  75 mg Oral Daily Dew, Jason S, MD   75 mg at 01/15/24 1427   diphenhydrAMINE (BENADRYL) capsule 25 mg  25 mg Oral Q8H PRN Josette Ade, MD       diphenoxylate-atropine (LOMOTIL) 2.5-0.025 MG per tablet 1 tablet  1 tablet Oral QID PRN Dew, Jason S, MD   1 tablet at 01/10/24 1421   feeding supplement (ENSURE PLUS HIGH PROTEIN) liquid 237 mL  237 mL Oral TID BM Fowler, Justin, DPM       folic acid  (FOLVITE ) tablet 1 mg  1 mg Oral Daily Dew, Jason S, MD   1 mg at 01/14/24 0804   [START ON 01/16/2024] furosemide  (LASIX ) tablet 20 mg  20 mg Oral Daily Wieting, Richard, MD       hydrALAZINE  (APRESOLINE ) tablet 25 mg  25 mg Oral TID Dew, Jason S, MD   25 mg at 01/14/24 2124   HYDROcodone -acetaminophen  (NORCO/VICODIN) 5-325 MG per tablet 1 tablet  1 tablet Oral Q4H PRN Dew, Jason S, MD   1 tablet at 01/14/24 2124   iron polysaccharides (NIFEREX) capsule 150 mg  150 mg Oral Daily Dew, Jason S, MD   150 mg at 01/14/24 0804   metoprolol  succinate (TOPROL -XL) 24 hr tablet 25 mg  25 mg Oral Daily Dew, Jason S, MD   25 mg at 01/14/24 0805   morphine  (PF) 2 MG/ML injection 1 mg  1 mg Intravenous Q6H PRN Dew, Jason S, MD   1 mg at 01/13/24 1018   multivitamin with minerals tablet 1 tablet  1 tablet Oral Daily Dew, Jason S, MD   1 tablet at 01/14/24 9192   ondansetron  (ZOFRAN ) tablet 4 mg  4 mg Oral Q6H PRN Dew, Jason S, MD       Or   ondansetron  (ZOFRAN ) injection 4 mg  4 mg Intravenous Q6H PRN Dew, Jason S, MD   4 mg at 01/14/24 1134   Oral care mouth rinse  15 mL Mouth Rinse PRN Marea Selinda RAMAN, MD       pantoprazole  (PROTONIX ) EC tablet 40 mg  40 mg Oral BID Dew, Jason S, MD   40 mg at 01/14/24 2125   polyethylene glycol (MIRALAX / GLYCOLAX) packet 17 g  17 g Oral Daily PRN Dew, Jason S, MD       sodium chloride  flush (NS) 0.9 %  injection 10-40 mL  10-40 mL Intracatheter Q12H Marea Selinda RAMAN, MD   10 mL at 01/14/24 2010   sodium chloride   flush (NS) 0.9 % injection 10-40 mL  10-40 mL Intracatheter PRN Marea, Selinda RAMAN, MD       thiamine  (VITAMIN B1) tablet 100 mg  100 mg Oral Daily Dew, Jason S, MD   100 mg at 01/10/24 1130   Or   thiamine  (VITAMIN B1) injection 100 mg  100 mg Intravenous Daily Dew, Jason S, MD   100 mg at 01/14/24 0802   [START ON 01/16/2024] Vitamin D  (Ergocalciferol ) (DRISDOL ) 1.25 MG (50000 UNIT) capsule 50,000 Units  50,000 Units Oral Q7 days Josette Ade, MD       zinc  sulfate (50mg  elemental zinc ) capsule 220 mg  220 mg Oral Daily Ashley Soulier, DPM   220 mg at 01/14/24 1608     Discharge Medications: Please see discharge summary for a list of discharge medications.  Relevant Imaging Results:  Relevant Lab Results:   Additional Information SS# [id number]  Corean ONEIDA Haddock, RN

## 2024-01-15 NOTE — Consult Note (Signed)
 Consultation Note Date: 01/15/2024   Patient Name: John Booker  DOB: [birth year]  MRN: [id number]  Age / Sex: 73 y.o., male  PCP: Alla Amis, MD Referring Physician: Josette Ade, MD  Reason for Consultation: Establishing goals of care  HPI/Patient Profile: 73 y.o. male  with past medical history of HTN, HFpEF, CKD stage II, lymphedema of bilateral lower extremities, esophageal stricture s/p dilatation, and chronic hiccups  admitted on 01/07/2024 with bilateral lower extremity and upper extremity edema which is gradually getting worse.  Lasix  infusion was initiated due to anasarca, now off.  11/4 EGD was done and esophageal stenosis was dilated.  11/6 patient underwent mechanical thrombectomy of the right SFA, tibioperoneal trunk and posterior tibial artery, stent placement right SFA, angioplasty right posterior tibial artery and tibioperoneal trunk, stent placement right tibioperoneal trunk. 11/7 patient underwent amputation of the interphalangeal joint great toe and amputation of the third toe on the right foot secondary to gangrene.  Poor p.o. intake continues due to achalasia and dysphagia.  PMT consulted to discuss goals of care.  Clinical Assessment and Goals of Care: I have reviewed medical records including EPIC notes, labs and imaging, assessed the patient and then met with patient and his daughter [name] to discuss diagnosis prognosis, GOC, EOL wishes, disposition and options.  I introduced Palliative Medicine as specialized medical care for people living with serious illness. It focuses on providing relief from the symptoms and stress of a serious illness. The goal is to improve quality of life for both the patient and the family.  Daughter shares prior to admission patient was living at home independently.  He used a walker for ambulation.  She tells me he even still drives short distances occasionally.  She tells me he was  becoming more weak due to his poor appetite.  She tells me he had good days and bad days.  She tells me the poor appetite is been going on for years with ongoing weight loss.   We discussed patient's current illness and what it means in the larger context of patient's on-going co-morbidities.  Natural disease trajectory and expectations at EOL were discussed.  We reviewed his hospital course including recent surgery on foot.  We reviewed ongoing issues with p.o. intake.  Daughter shares they have an appointment scheduled at [facility] for possible manometry and they plan to move forward with this once he recovers from current hospitalization.  I share with patient that the dietitian has recommended a feeding tube for him to get enough nutrition.  He immediately tells me absolutely not.  He tells me he would never consider this option.  He would not want to be dependent on a tube.  He and daughter tell me the intake issues are ongoing and they continue to explore options. We discuss nutritional supplements that are easier for him to get down. He is starting to be more open to supplements like ensure. She is also looking into purchasing magic cups for home.   They understand current issues will persist. We briefly discuss concerns about quality of life. We discuss at some point it may make sense to focus more on comfort and avoid aggressive medical care. We discuss at that time considering support of hospice may make sense - briefly reviewed philosophy of hospice care and type of support provided.  Discussed with daughter we could go ahead and refer to outpatient palliative to have their support and help them determine when hospice services would be appropriate - she agrees  to this.   She shares about his chronic hiccups - we briefly discussed medications for this - he has tried them all and have not been effective.   We review established code status - DNR/DNI.  Last TOC update in the chart stated  place for home with home health - we review this and daughter shares patient does recognize the need for SNF following hospitalization with the goal to get a bit stronger before returning home.   Discussed with patient and daughter the importance of continued conversation with family and the medical providers regarding overall plan of care and treatment options, ensuring decisions are within the context of the patient's values and GOCs.    Questions and concerns were addressed. The family was encouraged to call with questions or concerns.  Primary Decision Maker PATIENT - joined by [facility] daughter [name]    SUMMARY OF RECOMMENDATIONS   - patient says absolutely no PEG tube ever, daughter supports this - DNR/DNI - discussed nutritional supplements - outpatient palliative referral at SNF - introduced hospice support - ongoing conversations depending on how things go outpatient - outpatient palliative to continue  Code Status/Advance Care Planning: DNR      Primary Diagnoses: Present on Admission:  Anasarca  AKI (acute kidney injury)  Hyponatremia  Hypokalemia  Hypomagnesemia   I have reviewed the medical record, interviewed the patient and family, and examined the patient. The following aspects are pertinent.  Past Medical History:  Diagnosis Date   (HFpEF) heart failure with preserved ejection fraction (HCC)    a.) TTE 04/11/2021: EF 60-65%, no RWMAs, G1DD, mild LAE, RVSF norm, asc Ao 38 mm; b.) TTE 11/18/2021: EF 60-65%, no RWMAs, mild LVH, G1DD, norm RVSH; c.) TEE 11/20/2021: EF 60-65%, no RWMAs, no LAA thrombus, triv MR, no valvular vegetation, no IAS; d.) TTE 11/24/2022: EF 60-65%, no RWMAs, mod LVH, norm RVSF   Alcohol abuse, in remission    Aortic atherosclerosis    Bacteremia due to Gram-negative bacteria (Klebsiella oxytoca)    a.) source unidentified; b.) admitted to [facility] 11/20/2021 - 12/28/2021   Bilateral lower extremity edema    BPH (benign prostatic hyperplasia)     Cholelithiasis    Chronic hiccups    CKD (chronic kidney disease), stage III (HCC)    DOE (dyspnea on exertion)    Esophageal dysmotility    GERD (gastroesophageal reflux disease)    Hemorrhoids    Hepatic cyst    History of bilateral cataract extraction 2024   History of hiatal hernia    History of MSSA bacteremia secondary to foot wounds, s/p treatment 9/13 - 12/28/21 02/18/2022   Hypertension    Mediastinal mass    Memory changes    Metabolic encephalopathy    a.) in the setting of bacteremia/sepsis   MSSA bacteremia 11/2021   a.) source was foot wounds and probable septic arthritis in the lumbar spine   Nephrolithiasis    Neuropathy    Non-traumatic rhabdomyolysis    Paraspinal mass    Renal cyst, right    Sigmoid diverticulosis    Social History   Socioeconomic History   Marital status: Legally Separated    Spouse name: Not on file   Number of children: Not on file   Years of education: Not on file   Highest education level: Not on file  Occupational History   Not on file  Tobacco Use   Smoking status: Former    Current packs/day: 0.00    Average packs/day: 0.5 packs/day  for 38.0 years (19.0 ttl pk-yrs)    Types: Cigarettes    Start date: 04/10/1984    Quit date: 04/10/2022    Years since quitting: 1.7   Smokeless tobacco: Former  Building Services Engineer status: Never Used  Substance and Sexual Activity   Alcohol use: Not Currently    Comment: several months,[place]   Drug use: No   Sexual activity: Not Currently  Other Topics Concern   Not on file  Social History Narrative   Not on file   Social Drivers of Health   Financial Resource Strain: Low Risk  (05/07/2023)   Received from [facility]   Overall Financial Resource Strain (CARDIA)    Difficulty of Paying Living Expenses: Not hard at all  Food Insecurity: No Food Insecurity (01/07/2024)   Hunger Vital Sign    Worried About Running Out of Food in the Last Year: Never true    Ran  Out of Food in the Last Year: Never true  Transportation Needs: No Transportation Needs (01/07/2024)   PRAPARE - Administrator, Civil Service (Medical): No    Lack of Transportation (Non-Medical): No  Physical Activity: Not on file  Stress: Not on file  Social Connections: Socially Isolated (01/07/2024)   Social Connection and Isolation Panel    Frequency of Communication with Friends and Family: More than three times a week    Frequency of Social Gatherings with Friends and Family: Twice a week    Attends Religious Services: Never    Database Administrator or Organizations: No    Attends Engineer, Structural: Never    Marital Status: Divorced   Family History  Problem Relation Age of Onset   Hypertension Father    Scheduled Meds:  (feeding supplement) PROSource Plus  30 mL Oral BID BM   amoxicillin -clavulanate  1 tablet Oral Q12H   vitamin C   500 mg Oral BID   aspirin  EC  81 mg Oral Daily   atorvastatin  10 mg Oral Daily   clopidogrel  75 mg Oral Daily   feeding supplement  237 mL Oral TID BM   folic acid   1 mg Oral Daily   [START ON 01/16/2024] furosemide   20 mg Oral Daily   hydrALAZINE   25 mg Oral TID   iron polysaccharides  150 mg Oral Daily   metoprolol  succinate  25 mg Oral Daily   multivitamin with minerals  1 tablet Oral Daily   pantoprazole   40 mg Oral BID   sodium chloride  flush  10-40 mL Intracatheter Q12H   thiamine   100 mg Oral Daily   Or   thiamine   100 mg Intravenous Daily   [START ON 01/16/2024] Vitamin D  (Ergocalciferol )  50,000 Units Oral Q7 days   zinc  sulfate (50mg  elemental zinc )  220 mg Oral Daily   Continuous Infusions: PRN Meds:.acetaminophen  **OR** acetaminophen , bisacodyl, diphenoxylate-atropine, HYDROcodone -acetaminophen , morphine  injection, ondansetron  **OR** ondansetron  (ZOFRAN ) IV, mouth rinse, polyethylene glycol, sodium chloride  flush Allergies  Allergen Reactions   Adalat [Nifedipine]     Foot swelling.    Amlodipine   Other (See Comments)    Leg swelling   Review of Systems  Constitutional:  Positive for activity change and appetite change.    Physical Exam Constitutional:      General: He is not in acute distress.    Appearance: He is ill-appearing.  Pulmonary:     Effort: Pulmonary effort is normal.  Skin:    General: Skin is warm  and dry.  Neurological:     Mental Status: He is alert and oriented to person, place, and time.     Vital Signs: BP (!) 151/84 (BP Location: Right Arm)   Pulse 82   Temp (!) 97.5 F (36.4 C)   Resp 16   Ht 6' 3 (1.905 m)   Wt 74.9 kg   SpO2 100%   BMI 20.64 kg/m  Pain Scale: 0-10 POSS *See Group Information*: 2-Acceptable,Slightly drowsy, easily aroused Pain Score: 7    SpO2: SpO2: 100 % O2 Device:SpO2: 100 % O2 Flow Rate: .O2 Flow Rate (L/min): 2 L/min  IO: Intake/output summary:  Intake/Output Summary (Last 24 hours) at 01/15/2024 1516 Last data filed at 01/15/2024 1216 Gross per 24 hour  Intake 450 ml  Output 1205 ml  Net -755 ml    LBM: Last BM Date : 01/14/24 Baseline Weight: Weight: 83.9 kg Most recent weight: Weight: 74.9 kg     Palliative Assessment/Data: PPS 40%     *Please note that this is a verbal dictation therefore any spelling or grammatical errors are due to the Dragon Medical One system interpretation.   Time Total: 65 minutes Time spent includes: Detailed review of medical records (labs, imaging, vital signs), medically appropriate exam, discussion with treatment team, counseling and educating patient, family and/or staff, documenting clinical information, medication management and coordination of care.    Tobey Jama Barnacle, DNP, AGNP-C Palliative Medicine Team [phone] Pager: [phone]

## 2024-01-15 NOTE — Progress Notes (Addendum)
 Progress Note   Patient: John Booker FMW:[id number] DOB: [birth year] DOA: 01/07/2024     8 DOS: the patient was seen and examined on 01/15/2024   Brief hospital course: 73 y.o. male with Past medical history of HTN, HFpEF, CKD stage II, lymphedema of bilateral lower extremities, esophageal stricture s/p dilatation, chronic hiccups as reviewed from EMR, presented at [facility] ED with complaining of bilateral lower extremity and upper extremity edema which is gradually getting worse.  Patient is not a very good historian, as per patient's daughter lower extremity edema is gradually getting worse for the past 1 week and it is weeping.  There is an area of erythema in the left lower extremity on the lateral side which he scratched maybe had trauma.  Patient reported no fever.  While patient was getting ready to come to the ED, he fell and landed on his buttocks, no LOC.  Denies any pain after fall.   11/3.  Vascular surgery brought to the procedure suite for angiogram.  No intervention was done secondary to the patient needing an endoscopy and needing to be off anticoagulation.  The patient does have peripheral vascular disease.  Extensive lower extremity revascularization at a later date. 11/4.  EGD was done by Dr. Jinny and a benign-appearing esophageal stenosis was dilated. 11/5.  Patient still having trouble eating.  Case discussed with gastroenterology and will get speech pathology to evaluate for potential barium swallow.  May need manometry as outpatient. 11/6.  Dr. Marea did a mechanical thrombectomy of the right SFA, tibioperoneal trunk and posterior tibial artery, stent placement right SFA, angioplasty right posterior tibial artery and tibioperoneal trunk, stent placement right tibioperoneal trunk. 11/7.  Dr. Ashley did an amputation of the interphalangeal joint great toe and amputation of the third toe on the right foot secondary to gangrene.  Assessment and Plan: * Anasarca Initially was started on  Lasix  drip.  Monitoring off diuretics currently.  Will restart low-dose Lasix  tomorrow.  PAD (peripheral artery disease) 11/6. Patient had mechanical thrombectomy of the right SFA, tibioperoneal trunk and posterior tibial artery, stent placement to right SFA, angioplasty of right posterior tibial artery and tibioperoneal trunk, stent placement right tibioperoneal trunk.  On aspirin , Plavix and Lipitor.  Vascular team recommended compression wraps upon discharge, consulted wound care nurse.  Cellulitis of left lower extremity Cellulitis of bilateral lower extremities.  Gangrene right third toe and part of the first toe,  Left foot ulcer.  Also on the left foot areas of scabs which could be starting of gangrene.  Patient received vancomycin  and Rocephin  and switched over to Augmentin .  Had toe amputations today.  Esophageal dysphagia Continue PPI.  EGD with dilation on 11/4.  Appreciate speech pathology consultation.  Continue Protonix .  May end up needing outpatient manometry.  Stricture of esophagus Dilated on 11/4.  Continue PPI.  Hypomagnesemia Treated  AKI (acute kidney injury) Resolved.  Hypokalemia Treated  Hyponatremia Normal on today's check.  Protein-calorie malnutrition, moderate Continue supplements        Subjective: Patient seen this morning before amputation.  Had procedure yesterday to get better blood supply to his right foot.  Physical Exam: Vitals:   01/15/24 1219 01/15/24 1230 01/15/24 1246 01/15/24 1310  BP: 120/88 133/82 (!) 149/84 (!) 151/84  Pulse: 78 76 81 82  Resp: 16 19 (!) 21 16  Temp: 98.2 F (36.8 C)  98.2 F (36.8 C) (!) 97.5 F (36.4 C)  TempSrc:      SpO2: 100% 100% 100%  100%  Weight:      Height:       Physical Exam HENT:     Head: Normocephalic.     Mouth/Throat:     Pharynx: No oropharyngeal exudate.  Eyes:     General: Lids are normal.     Conjunctiva/sclera: Conjunctivae normal.  Cardiovascular:     Rate and Rhythm:  Normal rate and regular rhythm.     Heart sounds: Normal heart sounds, S1 normal and S2 normal.  Pulmonary:     Breath sounds: No decreased breath sounds, wheezing, rhonchi or rales.  Abdominal:     Palpations: Abdomen is soft.     Tenderness: There is no abdominal tenderness.  Musculoskeletal:     Right lower leg: Swelling present.     Left lower leg: Swelling present.  Skin:    General: Skin is warm.     Comments: Seen prior to amputation.  Gangrene third right toe, tip of first toe right foot.  Dark looking scabs on left foot which could be the starting of gangrene.  Ulcer left foot.  Neurological:     Mental Status: He is alert.     Data Reviewed: Creatinine 0.84, white blood cell count 7.4, hemoglobin 10.9, platelet count 283 Family Communication: Updated daughter on the phone  Disposition: Status is: Inpatient Toe amputations today.  Planned Discharge Destination: Physical therapy recommending rehab but patient mentioned home.    Time spent: 28 minutes  Author: Charlie Patterson, MD 01/15/2024 3:42 PM  For on call review [email or web address].

## 2024-01-15 NOTE — Transfer of Care (Signed)
 Immediate Anesthesia Transfer of Care Note  Patient: John Booker  Procedure(s) Performed: AMPUTATION, TOE (Right: Toe)  Patient Location: PACU  Anesthesia Type:MAC  Level of Consciousness: awake, alert , and drowsy  Airway & Oxygen Therapy: Patient Spontanous Breathing and Patient connected to face mask oxygen  Post-op Assessment: Report given to RN and Post -op Vital signs reviewed and stable  Post vital signs: Reviewed and stable  Last Vitals:  Vitals Value Taken Time  BP 120/88 01/15/24 12:19  Temp 36.8 C 01/15/24 12:19  Pulse 75 01/15/24 12:24  Resp 22 01/15/24 12:24  SpO2 100 % 01/15/24 12:24  Vitals shown include unfiled device data.  Last Pain:  Vitals:   01/15/24 1219  TempSrc:   PainSc: 0-No pain      Patients Stated Pain Goal: 0 (01/09/24 1424)  Complications: No notable events documented.

## 2024-01-15 NOTE — Progress Notes (Signed)
 Report called to [place] on 2C.

## 2024-01-15 NOTE — Assessment & Plan Note (Signed)
 Continue supplements

## 2024-01-15 NOTE — Progress Notes (Addendum)
 Progress Note    01/15/2024 7:32 AM 1 Day Post-Op  Subjective:  John Booker is a 73 yo male who is now POD #1 from:  Surgeon(s):DEW,JASON     Assistants:none   Pre-operative Diagnosis: PAD with ulceration and gangrenous changes RLE   Post-operative diagnosis:  Same   Procedure(s) Performed:             1.  Ultrasound guidance for vascular access left femoral artery             2.  Catheter placement into right common femoral artery from left femoral approach             3.  Selective right lower extremity angiogram             4.  Mechanical thrombectomy of the right SFA, tibioperoneal trunk and posterior tibial artery with the penumbra 6 bolt device             5.  Stent placement to the right SFA with 6 mm diameter by 15 cm length Viabahn stent             6.  Angioplasty of the right posterior tibial artery and tibioperoneal trunk with 3 mm diameter by 22 cm length angioplasty balloon             7.  Stent placement to the right tibioperoneal trunk with 3.5 mm diameter by 38 mm length Esprit scaffolding stent             8.  Celt closure device left femoral artery  Patient is resting comfortably in bed this morning.  No complications to note.  No complaints overnight.  Vitals all remained stable.   Vitals:   01/15/24 0400 01/15/24 0600  BP: (!) 154/91 (!) 148/84  Pulse:  60  Resp: 15 14  Temp: 97.9 F (36.6 C)   SpO2: 97% 98%   Physical Exam: Cardiac:  RRR, normal S1 and S2.  No murmurs appreciated. Lungs: Normal nonlabored breathing, no rales rhonchi or wheezing noted. Incisions: Right groin with dressing clean dry and intact.  No hematoma seroma note. Extremities: Palpable pulses in the upper extremities.  Unable to palpate pulses in the lower extremities due to +4 edema and swelling. Abdomen: Positive bowel sounds throughout, soft, nontender and nondistended. Neurologic: Alert and oriented x 3.  Answers all questions and follows commands appropriately  CBC     Component Value Date/Time   WBC 6.5 01/08/2024 0405   RBC 3.32 (L) 01/08/2024 0405   HGB 10.7 (L) 01/08/2024 0405   HGB 15.7 04/15/2012 2049   HCT 32.8 (L) 01/08/2024 0405   HCT 47.7 04/15/2012 2049   PLT 230 01/08/2024 0405   PLT 175 04/15/2012 2049   MCV 98.8 01/08/2024 0405   MCV 92 04/15/2012 2049   MCH 32.2 01/08/2024 0405   MCHC 32.6 01/08/2024 0405   RDW 12.5 01/08/2024 0405   RDW 13.9 04/15/2012 2049   LYMPHSABS 0.5 (L) 01/07/2024 1043   MONOABS 0.4 01/07/2024 1043   EOSABS 0.0 01/07/2024 1043   BASOSABS 0.0 01/07/2024 1043    BMET    Component Value Date/Time   NA 132 (L) 01/12/2024 0355   NA 135 (L) 04/15/2012 2049   K 3.5 01/12/2024 0355   K 3.5 04/15/2012 2049   CL 95 (L) 01/12/2024 0355   CL 98 04/15/2012 2049   CO2 29 01/12/2024 0355   CO2 30 04/15/2012 2049   GLUCOSE 85 01/12/2024 0355  GLUCOSE 107 (H) 04/15/2012 2049   BUN 22 01/12/2024 0355   BUN 17 04/15/2012 2049   CREATININE 0.79 01/12/2024 0355   CREATININE 1.15 12/20/2020 0946   CALCIUM 8.4 (L) 01/12/2024 0355   CALCIUM 8.5 04/15/2012 2049   GFRNONAA >60 01/12/2024 0355   GFRNONAA 73 12/20/2019 0916   GFRAA 85 12/20/2019 0916    INR    Component Value Date/Time   INR 1.1 12/13/2022 1514     Intake/Output Summary (Last 24 hours) at 01/15/2024 0732 Last data filed at 01/15/2024 0400 Gross per 24 hour  Intake 459 ml  Output 1000 ml  Net -541 ml     Assessment/Plan:  73 y.o. male is s/p See ABOVE 1 Day Post-Op   PLAN Start patient on DAPT therapy today, ASA 81 mg Daily and Plavix 75mg  Daily.  Patients bilateral lower extremities are much less swollen today.  Needs wound care Consult to bilateral lower extremities. Recommend UNNA boot wraps and follow up out patient with wound care.  Per Vascular surgery ok to discharge to SNF/Home when medically stable.   DVT prophylaxis:  ASA 81 mg and Plavix 75 mg    Evelyn Moch R Ibrahim [facility] 01/15/2024 7:32 AM

## 2024-01-15 NOTE — Progress Notes (Signed)
 [facility] Hospital Liaison Note  New referral for palliative care received from [facility], NP/PMT post hospital discharge.  Notified Corean Haddock, RN, TOC.  Location to be determined.  Bed search is underway.  Referral sent to [facility] referral intake.  Thank you for allowing participation in this patient's care.  Saddie HILARIO Na, RN Nurse Liaison [phone]

## 2024-01-15 NOTE — Consult Note (Addendum)
 WOC Nurse Consult Note:   WOC consult reviewed, patient currently in OR.  Will need compression wraps applied.  Plan of care discussed with attending and vascular PA.   WOC team will see patient on Monday for compression wrap application, in the interim will modify orders from [facility] consult on 11/1 (see note) to include Kerlix and coban wrap as follows:    Cleanse LE wounds with saline, pat dry. Apply silver  hydrofiber over weepiing wounds, top with foam. Wrap bilateral LE with Kerlix and Coban beginning at base of toes and ending below knee.  Change 2 times weekly.    Thank you,  Doyal Polite, MSN, RN, [facility] WOC Team [phone] (Available Mon-Fri 0700-1500)

## 2024-01-15 NOTE — Anesthesia Preprocedure Evaluation (Signed)
 Anesthesia Evaluation  Patient identified by MRN, date of birth, ID band Patient awake    Reviewed: Allergy & Precautions, H&P , NPO status , Patient's Chart, lab work & pertinent test results, reviewed documented beta blocker date and time   History of Anesthesia Complications Negative for: history of anesthetic complications  Airway Mallampati: II   Neck ROM: full    Dental  (+) Poor Dentition   Pulmonary neg pulmonary ROS, former smoker   Pulmonary exam normal        Cardiovascular Exercise Tolerance: Poor hypertension, On Medications (-) angina + Peripheral Vascular Disease, +CHF and + DOE  (-) Past MI and (-) Cardiac Stents Normal cardiovascular exam(-) dysrhythmias (-) Valvular Problems/Murmurs Rhythm:regular Rate:Normal     Neuro/Psych  Headaches PSYCHIATRIC DISORDERS       Neuromuscular disease    GI/Hepatic Neg liver ROS, hiatal hernia,GERD  Medicated,,  Endo/Other  negative endocrine ROS    Renal/GU      Musculoskeletal   Abdominal   Peds  Hematology negative hematology ROS (+)   Anesthesia Other Findings Past Medical History: No date: (HFpEF) heart failure with preserved ejection fraction (HCC)     Comment:  a.) TTE 04/11/2021: EF 60-65%, no RWMAs, G1DD, mild LAE,              RVSF norm, asc Ao 38 mm; b.) TTE 11/18/2021: EF 60-65%,               no RWMAs, mild LVH, G1DD, norm RVSH; c.) TEE 11/20/2021:               EF 60-65%, no RWMAs, no LAA thrombus, triv MR, no               valvular vegetation, no IAS; d.) TTE 11/24/2022: EF               60-65%, no RWMAs, mod LVH, norm RVSF No date: Alcohol abuse, in remission No date: Aortic atherosclerosis No date: Bacteremia due to Gram-negative bacteria (Klebsiella oxytoca)     Comment:  a.) source unidentified; b.) admitted to [facility] 11/20/2021              - 12/28/2021 No date: Bilateral lower extremity edema No date: BPH (benign prostatic hyperplasia) No  date: Cholelithiasis No date: Chronic hiccups No date: CKD (chronic kidney disease), stage III (HCC) No date: DOE (dyspnea on exertion) No date: Esophageal dysmotility No date: GERD (gastroesophageal reflux disease) No date: Hemorrhoids No date: Hepatic cyst 2024: History of bilateral cataract extraction No date: History of hiatal hernia 02/18/2022: History of MSSA bacteremia secondary to foot wounds, s/p  treatment 9/13 - 12/28/21 No date: Hypertension No date: Mediastinal mass No date: Memory changes No date: Metabolic encephalopathy     Comment:  a.) in the setting of bacteremia/sepsis 11/2021: MSSA bacteremia     Comment:  a.) source was foot wounds and probable septic arthritis              in the lumbar spine No date: Nephrolithiasis No date: Neuropathy No date: Non-traumatic rhabdomyolysis No date: Paraspinal mass No date: Renal cyst, right No date: Sigmoid diverticulosis Past Surgical History: 02/16/2023: BRONCHIAL WASHINGS; N/A     Comment:  Procedure: BRONCHIAL WASHINGS;  Surgeon: Parris Manna, MD;  Location: [facility] ORS;  Service: Thoracic;                Laterality: N/A;  11/13/2022: CATARACT EXTRACTION W/PHACO; Left     Comment:  Procedure: CATARACT EXTRACTION PHACO AND INTRAOCULAR               LENS PLACEMENT (IOC) LEFT OMIDRIA    13.58  01:23.3;                Surgeon: Enola Feliciano Hugger, MD;  Location: [facility]               SURGERY CNTR;  Service: Ophthalmology;  Laterality: Left; 01/08/2023: CATARACT EXTRACTION W/PHACO; Right     Comment:  Procedure: CATARACT EXTRACTION PHACO AND INTRAOCULAR               LENS PLACEMENT (IOC) RIGHT OMIDRIA  14.23 01:16.5;                Surgeon: Enola Feliciano Hugger, MD;  Location: [facility]               SURGERY CNTR;  Service: Ophthalmology;  Laterality:               Right; 07/14/2023: ESOPHAGOGASTRODUODENOSCOPY; N/A     Comment:  Procedure: EGD (ESOPHAGOGASTRODUODENOSCOPY);  Surgeon:               Maryruth Ole DASEN, MD;  Location: [facility] ENDOSCOPY;                Service: Endoscopy;  Laterality: N/A; 05/26/2018: ESOPHAGOGASTRODUODENOSCOPY (EGD) WITH PROPOFOL ; N/A     Comment:  Procedure: ESOPHAGOGASTRODUODENOSCOPY (EGD) WITH               PROPOFOL ;  Surgeon: Janalyn Keene NOVAK, MD;  Location:               [facility] ENDOSCOPY;  Service: Endoscopy;  Laterality: N/A; 12/28/2020: ESOPHAGOGASTRODUODENOSCOPY (EGD) WITH PROPOFOL ; N/A     Comment:  Procedure: ESOPHAGOGASTRODUODENOSCOPY (EGD) WITH               PROPOFOL ;  Surgeon: Onita Elspeth Sharper, DO;  Location:              [facility] ENDOSCOPY;  Service: Gastroenterology;  Laterality:               N/A; 10/09/2021: ESOPHAGOGASTRODUODENOSCOPY (EGD) WITH PROPOFOL ; N/A     Comment:  Procedure: ESOPHAGOGASTRODUODENOSCOPY (EGD) WITH               PROPOFOL ;  Surgeon: Therisa Bi, MD;  Location: [facility]               ENDOSCOPY;  Service: Gastroenterology;  Laterality: N/A; 02/16/2023: FLEXIBLE BRONCHOSCOPY; N/A     Comment:  Procedure: FLEXIBLE BRONCHOSCOPY;  Surgeon: Parris Manna, MD;  Location: [facility] ORS;  Service: Thoracic;                Laterality: N/A; 11/19/2015: INCISION AND DRAINAGE PERIRECTAL ABSCESS; N/A     Comment:  Procedure: IRRIGATION AND DEBRIDEMENT PERIRECTAL ABSCESS              and debridement of scrotal abscess;  Surgeon: Charlie FORBES Fell, MD;  Location: [facility] ORS;  Service: General;                Laterality: N/A; 11/20/2015: INCISION AND DRAINAGE PERIRECTAL ABSCESS; N/A     Comment:  Procedure: IRRIGATION AND DEBRIDEMENT PERIRECTAL ABSCESS              /  WITH DRESSING CHANGE;  Surgeon: Charlie FORBES Fell, MD;                Location: [facility] ORS;  Service: General;  Laterality: N/A;               peri rectal site 11/21/2015: INCISION AND DRAINAGE PERIRECTAL ABSCESS; N/A     Comment:  Procedure: IRRIGATION AND DEBRIDEMENT PERIRECTAL ABSCESS              WITH DRESSING CHANGE;  Surgeon: Charlie FORBES Fell, MD;                 Location: [facility] ORS;  Service: General;  Laterality: N/A; 01/11/2024: LOWER EXTREMITY ANGIOGRAPHY; Right     Comment:  Procedure: Lower Extremity Angiography;  Surgeon: Marea Selinda RAMAN, MD;  Location: [facility] INVASIVE CV LAB;  Service:               Cardiovascular;  Laterality: Right; 1989: MINOR HEMORRHOIDECTOMY 11/20/2021: TEE WITHOUT CARDIOVERSION; N/A     Comment:  Procedure: TRANSESOPHAGEAL ECHOCARDIOGRAM (TEE);                Surgeon: Hester Wolm PARAS, MD;  Location: [facility] ORS;                Service: Cardiovascular;  Laterality: N/A;  8:00am BMI    Body Mass Index: 20.64 kg/m     Reproductive/Obstetrics negative OB ROS                              Anesthesia Physical Anesthesia Plan  ASA: 3  Anesthesia Plan: General   Post-op Pain Management: Minimal or no pain anticipated   Induction: Intravenous  PONV Risk Score and Plan: 2 and Propofol  infusion and TIVA  Airway Management Planned: Nasal Cannula  Additional Equipment: None  Intra-op Plan:   Post-operative Plan:   Informed Consent: I have reviewed the patients History and Physical, chart, labs and discussed the procedure including the risks, benefits and alternatives for the proposed anesthesia with the patient or authorized representative who has indicated his/her understanding and acceptance.     Dental advisory given  Plan Discussed with: CRNA and Surgeon  Anesthesia Plan Comments: (Discussed risks of anesthesia with patient, including possibility of difficulty with spontaneous ventilation under anesthesia necessitating airway intervention, PONV, and rare risks such as cardiac or respiratory or neurological events, and allergic reactions. Discussed the role of CRNA in patient's perioperative care. Patient understands.)        Anesthesia Quick Evaluation

## 2024-01-16 DIAGNOSIS — E440 Moderate protein-calorie malnutrition: Secondary | ICD-10-CM

## 2024-01-16 DIAGNOSIS — R601 Generalized edema: Secondary | ICD-10-CM | POA: Diagnosis not present

## 2024-01-16 DIAGNOSIS — Z515 Encounter for palliative care: Secondary | ICD-10-CM | POA: Diagnosis not present

## 2024-01-16 DIAGNOSIS — L03116 Cellulitis of left lower limb: Secondary | ICD-10-CM | POA: Diagnosis not present

## 2024-01-16 DIAGNOSIS — K222 Esophageal obstruction: Secondary | ICD-10-CM | POA: Diagnosis not present

## 2024-01-16 DIAGNOSIS — R1319 Other dysphagia: Secondary | ICD-10-CM | POA: Diagnosis not present

## 2024-01-16 MED ORDER — AMOXICILLIN-POT CLAVULANATE 875-125 MG PO TABS
1.0000 | ORAL_TABLET | Freq: Two times a day (BID) | ORAL | Status: DC
Start: 2024-01-16 — End: 2024-01-22
  Administered 2024-01-16 – 2024-01-18 (×4): 1 via ORAL
  Filled 2024-01-16 (×4): qty 1

## 2024-01-16 NOTE — Progress Notes (Signed)
 Progress Note   Patient: John Booker FMW:[id number] DOB: 23-Jun-1950 DOA: 01/07/2024     9 DOS: the patient was seen and examined on 01/16/2024   Brief hospital course: 73 y.o. male with Past medical history of HTN, HFpEF, CKD stage II, lymphedema of bilateral lower extremities, esophageal stricture s/p dilatation, chronic hiccups as reviewed from EMR, presented at [facility] ED with complaining of bilateral lower extremity and upper extremity edema which is gradually getting worse.  Patient is not a very good historian, as per patient's daughter lower extremity edema is gradually getting worse for the past 1 week and it is weeping.  There is an area of erythema in the left lower extremity on the lateral side which he scratched maybe had trauma.  Patient reported no fever.  While patient was getting ready to come to the ED, he fell and landed on his buttocks, no LOC.  Denies any pain after fall.   11/3.  Vascular surgery brought to the procedure suite for angiogram.  No intervention was done secondary to the patient needing an endoscopy and needing to be off anticoagulation.  The patient does have peripheral vascular disease.  Extensive lower extremity revascularization at a later date. 11/4.  EGD was done by Dr. Jinny and a benign-appearing esophageal stenosis was dilated. 11/5.  Patient still having trouble eating.  Case discussed with gastroenterology and will get speech pathology to evaluate for potential barium swallow.  May need manometry as outpatient. 11/6.  Dr. Marea did a mechanical thrombectomy of the right SFA, tibioperoneal trunk and posterior tibial artery, stent placement right SFA, angioplasty right posterior tibial artery and tibioperoneal trunk, stent placement right tibioperoneal trunk. 11/7.  Dr. Ashley did an amputation of the interphalangeal joint great toe and amputation of the third toe on the right foot secondary to gangrene. 11/8.  No pain in right foot after amputation.  Podiatry  cleared to get out of the hospital.  Looking into rehab beds.  Assessment and Plan: * Anasarca Initially was started on Lasix  drip.  Was off diuretics for procedures.  Restarted low-dose Lasix  on 11/8.  PAD (peripheral artery disease) 11/6. Patient had mechanical thrombectomy of the right SFA, tibioperoneal trunk and posterior tibial artery, stent placement to right SFA, angioplasty of right posterior tibial artery and tibioperoneal trunk, stent placement right tibioperoneal trunk.  On aspirin , Plavix and Lipitor.  Vascular team recommended compression wraps upon discharge, consulted wound care nurse.  Cellulitis of left lower extremity Cellulitis of bilateral lower extremities.  Gangrene right third toe and part of the first toe,  Left foot ulcer.  Also on the left foot areas of scabs which could be starting of gangrene.  Patient received vancomycin  and Rocephin  and switched over to Augmentin .  Had toe amputations 11/7 by Dr. Ashley.  Esophageal dysphagia Continue PPI.  EGD with dilation on 11/4.  Appreciate speech pathology consultation.  Continue Protonix .  May end up needing outpatient manometry.  Stricture of esophagus Dilated on 11/4.  Continue PPI.  Hypomagnesemia Treated  AKI (acute kidney injury) Resolved.  Hypokalemia Treated  Hyponatremia Normalized  Protein-calorie malnutrition, moderate Continue supplements        Subjective: Patient feels okay.  No pain in his foot.  States he did okay with breakfast this morning.  Offers no other complaints.  Admitted with bilateral lower extremity edema.  Physical Exam: Vitals:   01/15/24 1246 01/15/24 1310 01/15/24 2045 01/16/24 0603  BP: (!) 149/84 (!) 151/84 (!) 148/66 (!) 146/89  Pulse: 81  82 78 85  Resp: (!) 21 16 16 15   Temp: 98.2 F (36.8 C) (!) 97.5 F (36.4 C) 97.6 F (36.4 C) 97.8 F (36.6 C)  TempSrc:   Oral Oral  SpO2: 100% 100% 99% 97%  Weight:      Height:       Physical Exam HENT:     Head:  Normocephalic.     Mouth/Throat:     Pharynx: No oropharyngeal exudate.  Eyes:     General: Lids are normal.     Conjunctiva/sclera: Conjunctivae normal.  Cardiovascular:     Rate and Rhythm: Normal rate and regular rhythm.     Heart sounds: S1 normal and S2 normal. Murmur heard.     Systolic murmur is present with a grade of 2/6.  Pulmonary:     Breath sounds: No decreased breath sounds, wheezing, rhonchi or rales.  Abdominal:     Palpations: Abdomen is soft.     Tenderness: There is no abdominal tenderness.  Musculoskeletal:     Right lower leg: Swelling present.     Left lower leg: Swelling present.  Skin:    General: Skin is warm.     Comments: Right foot wrapped.  Left foot looks like areas of black scabs  were removed.  Neurological:     Mental Status: He is alert.     Data Reviewed: Creatinine 0.84, hemoglobin 10.9, white blood cell count 7.4  Family Communication: Updated patient's daughter on the phone  Disposition: Status is: Inpatient Remains inpatient appropriate because: Stable to go out to rehab once we find a bed and insurance authorization.  Planned Discharge Destination: Rehab    Time spent: 28 minutes  Author: Charlie Patterson, MD 01/16/2024 12:29 PM  For on call review [email or web address].

## 2024-01-16 NOTE — Progress Notes (Signed)
 Physical Therapy Treatment Patient Details Name: John Booker MRN: [id number] DOB: [birth year] Today's Date: 01/16/2024   History of Present Illness Pt is a 73 y/o M admitted on 01/07/24 following a fall, c/o worsening BLE & BUE edema. Pt is being treated for anasarca, BLE cellulitis, ischemic R 1st toe ulcer & ischemia of 3rd toe. PMH: HTN, HFpEF, CKD 2, lymphedema of BLE, chronic hiccups, EtOH abuse.  Hospital course significant for R LE aortogram/angiogram (11/3), RR LE thrombectomy (11/6), R great toe, 3rd toe amputation and 5th MTPJ debridement (11/7).  Cleared per podiatry for [facility] short-distance with post-op shoe to R LE.    PT Comments  Patient seen for follow up treatment session, noted recent procedures and updates to WBing/activity recommendations (see HPI).  Continues to mobilize at min assist level, wearing post-op shoe to R LE for protection.  Patient intermittently irritable, but easily redirectable during session; appears generally anxious about situation and overall plan of care. Does fatigue quickly with minimal activity (gait 10' max) and insists on return to bed end of session despite encouragement for OOB to chair.  Goals and overall POC updated to reflect progress    If plan is discharge home, recommend the following: A little help with walking and/or transfers;A lot of help with bathing/dressing/bathroom;Assistance with cooking/housework;Assist for transportation;Help with stairs or ramp for entrance   Can travel by private vehicle     Yes  Equipment Recommendations  Rolling walker (2 wheels);BSC/3in1    Recommendations for Other Services       Precautions / Restrictions Precautions Precautions: Fall Other Brace: post-op shoe Restrictions Weight Bearing Restrictions Per Provider Order: Yes RLE Weight Bearing Per Provider Order: Weight bearing as tolerated Other Position/Activity Restrictions: WBAT + post-op shoe     Mobility  Bed Mobility Overal bed  mobility: Needs Assistance Bed Mobility: Supine to Sit, Sit to Supine     Supine to sit: Supervision Sit to supine: Supervision        Transfers Overall transfer level: Needs assistance Equipment used: Rolling walker (2 wheels) Transfers: Sit to/from Stand, Bed to chair/wheelchair/BSC Sit to Stand: Min assist, Mod assist, +2 safety/equipment Stand pivot transfers: Min assist, Mod assist, +2 safety/equipment              Ambulation/Gait Ambulation/Gait assistance: Min assist, +2 safety/equipment Gait Distance (Feet): 10 Feet Assistive device: Rolling walker (2 wheels)         General Gait Details: 3-point, step to gait pattern with R post-op shoe donned.  Fair walker position and control; fair protection of R LE. Minimal/no reports of pain with WBing.  Distance limited by reports of dizziness, generalized fatigue with activity; vitals stable and [facility]   Stairs             Wheelchair Mobility     Tilt Bed    Modified Rankin (Stroke Patients Only)       Balance Overall balance assessment: Needs assistance Sitting-balance support: No upper extremity supported, Feet supported Sitting balance-Leahy Scale: Good     Standing balance support: Bilateral upper extremity supported Standing balance-Leahy Scale: Fair                              Hotel Manager: Impaired Factors Affecting Communication: Hearing impaired  Cognition Arousal: Alert Behavior During Therapy: Anxious   PT - Cognitive impairments: No family/caregiver present to determine baseline  PT - Cognition Comments: Patient easily irritable, but redirectable.  Suspect irritability rooted in anxiety over current situation/health condition. Following commands: Impaired Following commands impaired: Follows one step commands with increased time, Follows multi-step commands with increased time    Cueing Cueing Techniques: Verbal  cues, Gestural cues, Tactile cues, Visual cues  Exercises Other Exercises Other Exercises: Educated in use of R LE post-op shoe, technique for donning and wearing recommendations; patient voiced understanding. Will continue to reinforce as needed.    General Comments General comments (skin integrity, edema, etc.): Pt endorsed dizziness after walking ~10' with RW requiring him to return to sitting. Attempted to stand after seated rest break but endorsed ongoing dizziness and requested to return to bed. BP monitored.      Pertinent Vitals/Pain Pain Assessment Pain Assessment: No/denies pain    Home Living Family/patient expects to be discharged to:: Private residence Living Arrangements: Alone Available Help at Discharge: Family;Available PRN/intermittently Type of Home: House Home Access: Stairs to enter Entrance Stairs-Rails: Right;Left Entrance Stairs-Number of Steps: 7   Home Layout: One level Home Equipment: Agricultural Consultant (2 wheels);Shower seat      Prior Function            PT Goals (current goals can now be found in the care plan section) Acute Rehab PT Goals Patient Stated Goal: decreased pain, get better PT Goal Formulation: With patient Time For Goal Achievement: 01/30/24 Potential to Achieve Goals: Fair Progress towards PT goals: Progressing toward goals    Frequency    Min 2X/week      PT Plan      Co-evaluation PT/OT/SLP Co-Evaluation/Treatment: Yes Reason for Co-Treatment: For patient/therapist safety;To address functional/ADL transfers PT goals addressed during session: Mobility/safety with mobility;Balance;Proper use of DME OT goals addressed during session: ADL's and self-care;Proper use of Adaptive equipment and DME      AM-PAC PT 6 Clicks Mobility   Outcome Measure  Help needed turning from your back to your side while in a flat bed without using bedrails?: None Help needed moving from lying on your back to sitting on the side of a flat  bed without using bedrails?: A Little Help needed moving to and from a bed to a chair (including a wheelchair)?: A Little Help needed standing up from a chair using your arms (e.g., wheelchair or bedside chair)?: A Little Help needed to walk in hospital room?: A Little Help needed climbing 3-5 steps with a railing? : A Lot 6 Click Score: 18    End of Session Equipment Utilized During Treatment: Gait belt Activity Tolerance: Patient tolerated treatment well Patient left: in bed;with call bell/phone within reach;with bed alarm set (transferred to chair during session, but insisted on return to bed end of session) Nurse Communication: Mobility status PT Visit Diagnosis: Muscle weakness (generalized) (M62.81);Pain;Difficulty in walking, not elsewhere classified (R26.2) Pain - Right/Left: Right Pain - part of body: Leg     Time: 8378-8351 PT Time Calculation (min) (ACUTE ONLY): 27 min  Charges:    $Therapeutic Activity: 8-22 mins PT General Charges $$ ACUTE PT VISIT: 1 Visit                    Bralyn Espino H. Delores, PT, DPT, NCS 01/16/24, 5:18 PM [phone]

## 2024-01-16 NOTE — Progress Notes (Signed)
 Daily Progress Note   Subjective  - 1 Day Post-Op  Patient is status post 1 day after undergoing a right great toe partial and third toe complete amputations.  John did have some bleeding on his dressing overnight but nothing that was actively pumping.  John presents today resting in bed comfortably and complains of no pain and is eating breakfast.  Objective Vitals:   01/15/24 1246 01/15/24 1310 01/15/24 2045 01/16/24 0603  BP: (!) 149/84 (!) 151/84 (!) 148/66 (!) 146/89  Pulse: 81 82 78 85  Resp: (!) 21 16 16 15   Temp: 98.2 F (36.8 C) (!) 97.5 F (36.4 C) 97.6 F (36.4 C) 97.8 F (36.6 C)  TempSrc:   Oral Oral  SpO2: 100% 100% 99% 97%  Weight:      Height:        Physical Exam: Incision sites appear to be well coapted overall to the right great toe and third toe amputation sites.  No signs of infection overall, mild strikethrough on dressings prior to change.  Small dorsal foot ulceration appears to be stable, small scratch also present to the right anterior lower shin which appears to be stable and nearly healed.  Left lower extremity wound appears to be stable with no signs of infection.  Laboratory CBC    Component Value Date/Time   WBC 7.4 01/15/2024 0839   HGB 10.9 (L) 01/15/2024 0839   HGB 15.7 04/15/2012 2049   HCT 34.1 (L) 01/15/2024 0839   HCT 47.7 04/15/2012 2049   PLT 283 01/15/2024 0839   PLT 175 04/15/2012 2049    BMET    Component Value Date/Time   NA 136 01/15/2024 0839   NA 135 (L) 04/15/2012 2049   K 4.2 01/15/2024 0839   K 3.5 04/15/2012 2049   CL 98 01/15/2024 0839   CL 98 04/15/2012 2049   CO2 30 01/15/2024 0839   CO2 30 04/15/2012 2049   GLUCOSE 114 (H) 01/15/2024 0839   GLUCOSE 107 (H) 04/15/2012 2049   BUN 23 01/15/2024 0839   BUN 17 04/15/2012 2049   CREATININE 0.84 01/15/2024 0839   CREATININE 1.15 12/20/2020 0946   CALCIUM 8.3 (L) 01/15/2024 0839   CALCIUM 8.5 04/15/2012 2049   GFRNONAA >60 01/15/2024 0839   GFRNONAA 73 12/20/2019  0916   GFRAA 85 12/20/2019 0916    Assessment/Planning: Gangrene right great toe and third toe status post amputation Bilateral lower extremity leg wounds Severe peripheral vascular disease  Patient seen and examined Incision sites to the right great toe and third toe amputation areas appear to be well coapted with sutures intact, no signs of infection present overall.  Wounds present to bilateral lower extremities appear to be stable at this time with no signs of infection. Redressed today with Xeroform to the incisional areas, silver  alginate to all the other wounds to bilateral lower extremities followed by 4 x 4 gauze and gauze roll to the right foot and Mepilex foam borders to the right heel, right lower leg, left lower leg.  Ace wrap to the right foot.  Recommend dressing changes per Dr. Ladonna orders already placed in chart.  Home health orders placed Appreciate recommendations for antibiotic therapy.  Can likely send home on Augmentin  7-day course. Patient may weight-bear to bilateral lower extremities, postop shoe(s).  Patient should keep walking short distances.  Podiatry team to sign off at this time patient to follow-up in outpatient clinic with Dr. Ashley in 1 week  John Booker, DPM  01/16/2024, 9:20 AM

## 2024-01-16 NOTE — Progress Notes (Signed)
 Pt started draining fresh red blood from right foot dressing, small amount now.  Will continue to monitor, VSS and provider notified.

## 2024-01-16 NOTE — Evaluation (Signed)
 Occupational Therapy Re-Evaluation Patient Details Name: John Booker MRN: [id number] DOB: 10-05-1950 Today's Date: 01/16/2024   History of Present Illness   Pt is a 73 y/o M admitted on 01/07/24 following a fall, c/o worsening BLE & BUE edema. Pt is being treated for anasarca, BLE cellulitis, ischemic R 1st toe ulcer & ischemia of 3rd toe. PMH: HTN, HFpEF, CKD 2, lymphedema of BLE, chronic hiccups, EtOH abuse. S/p R toe amputations on 11/7.     Clinical Impressions Pt seen for OT re-evaluation with PT for safety with ADL/mobility. Pt denied pain, endorsed weakness and fatigue after limited exertion. Pt verbalized understanding of post-op shoe guidelines. He required MAX A for donning sock on L foot and post-op shoe on R foot. Anticipate pt will required MAX A for LB ADL otherwise. Pt demo'd difficulty with RLE mgt during bed mobility but ultimately completed with SBA and increased effort/time and use of bed rails with HOB elevated. Pt completed 3 STS transfers and a step pivot transfer to the recliner requiring MOD A + 2 for safety with VC for hand placement. Pt ambulated to the door with RW and chair follow for safety then endorsed significant dizziness and returned to sitting. Pt attempted to stand and walk further after seated rest break and BP assessed but endorsed ongoing dizziness and returned to bed. Pt continues to benefit from skilled OT services. DC recommendation remains appropriate. Goals reviewed and updated to reflect progress.      If plan is discharge home, recommend the following:   Two people to help with walking and/or transfers;Two people to help with bathing/dressing/bathroom;Supervision due to cognitive status;Direct supervision/assist for medications management;Help with stairs or ramp for entrance;Assist for transportation;Assistance with cooking/housework     Functional Status Assessment   Patient has had a recent decline in their functional status and demonstrates  the ability to make significant improvements in function in a reasonable and predictable amount of time.     Equipment Recommendations   Other (comment) (defer to next venyue)     Recommendations for Other Services         Precautions/Restrictions   Precautions Precautions: Fall Recall of Precautions/Restrictions: Intact Required Braces or Orthoses: Other Brace Other Brace: post-op shoe Restrictions Weight Bearing Restrictions Per Provider Order: Yes RLE Weight Bearing Per Provider Order: Weight bearing as tolerated Other Position/Activity Restrictions: WBAT + post-op shoe     Mobility Bed Mobility Overal bed mobility: Needs Assistance Bed Mobility: Supine to Sit, Sit to Supine     Supine to sit: Supervision, HOB elevated, Used rails Sit to supine: Supervision, HOB elevated, Used rails   General bed mobility comments: SBA-CGA, increased effort for RLE    Transfers Overall transfer level: Needs assistance Equipment used: Rolling walker (2 wheels) Transfers: Sit to/from Stand, Bed to chair/wheelchair/BSC Sit to Stand: Mod assist, +2 safety/equipment     Step pivot transfers: Min assist, Mod assist, +2 safety/equipment     General transfer comment: VC for hand placement with each transfer attempt, increased effort/time      Balance Overall balance assessment: Needs assistance Sitting-balance support: Feet supported Sitting balance-Leahy Scale: Good     Standing balance support: During functional activity, Bilateral upper extremity supported, Reliant on assistive device for balance Standing balance-Leahy Scale: Fair                             ADL either performed or assessed with clinical judgement   ADL  Overall ADL's : Needs assistance/impaired                 Upper Body Dressing : Sitting;Minimal assistance   Lower Body Dressing: Sitting/lateral leans;Maximal assistance Lower Body Dressing Details (indicate cue type and reason):  MAX A for donning L sock and R post-op shoe, anticiapte MAX A for LB dressing involving STS transfers with RW Toilet Transfer: Moderate assistance;Rolling walker (2 wheels) Toilet Transfer Details (indicate cue type and reason): simulated, step pivot to recliner                 Vision         Perception         Praxis         Pertinent Vitals/Pain Pain Assessment Pain Assessment: No/denies pain     Extremity/Trunk Assessment Upper Extremity Assessment Upper Extremity Assessment: Generalized weakness RUE Deficits / Details: edema around R elbow; AROM; shoulder flexion to approx 3/4 full AROM, elbow/wrist/hand grossly WFL LUE Deficits / Details: AROM; shoulder flexion to approx 3/4 full AROM, elbow/wrist/hand grossly WFL   Lower Extremity Assessment Lower Extremity Assessment: Defer to PT evaluation;Generalized weakness;RLE deficits/detail RLE Deficits / Details: s/p toe amputations, foot bandaged, denies pain, increased effort to move RLE versus LLE during bed mobility       Communication Communication Communication: Impaired Factors Affecting Communication: Hearing impaired   Cognition Arousal: Alert Behavior During Therapy: Anxious (irritated) Cognition: No family/caregiver present to determine baseline             OT - Cognition Comments: Pt easily irritated, able to follow commands with cues                 Following commands: Impaired Following commands impaired: Follows one step commands with increased time, Follows multi-step commands with increased time     Cueing  General Comments   Cueing Techniques: Verbal cues;Gestural cues;Tactile cues;Visual cues  Pt endorsed dizziness after walking ~10' with RW requiring him to return to sitting. Attempted to stand after seated rest break but endorsed ongoing dizziness and requested to return to bed. BP monitored.   Exercises Other Exercises Other Exercises: Pt completed STS transfers with RW x3,  ambulated with chair follow for safety ~10'   Shoulder Instructions      Home Living Family/patient expects to be discharged to:: Private residence Living Arrangements: Alone Available Help at Discharge: Family;Available PRN/intermittently Type of Home: House Home Access: Stairs to enter [employer] of Steps: 7 Entrance Stairs-Rails: Right;Left Home Layout: One level     Bathroom Shower/Tub: Tub/shower unit (has been sponge bathing the past few weeks 2/2 increasing weakness)         Home Equipment: Rolling Walker (2 wheels);Shower seat          Prior Functioning/Environment Prior Level of Function : Independent/Modified Independent;History of Falls (last six months);Driving             Mobility Comments: Pt reports he began using RW about 6 months ago 2/2 weakness ADLs Comments: Reports increasing difficulties with ADLs, recently started sponge bathing, notes he still drives to go to the grocery store unlesss his daughter can bring him some.    OT Problem List: Decreased activity tolerance;Impaired balance (sitting and/or standing);Decreased strength;Decreased knowledge of use of DME or AE;Decreased knowledge of precautions   OT Treatment/Interventions: Self-care/ADL training;Balance training;Therapeutic exercise;Therapeutic activities;DME and/or AE instruction;Patient/family education;Energy conservation      OT Goals(Current goals can be found in the care plan section)  Acute Rehab OT Goals Patient Stated Goal: get better and go home OT Goal Formulation: With patient Time For Goal Achievement: 01/30/24 Potential to Achieve Goals: Fair ADL Goals Pt Will Perform Grooming: with modified independence;sitting Pt Will Transfer to Toilet: with contact guard assist;ambulating (LRAD) Pt Will Perform Toileting - Clothing Manipulation and hygiene: sit to/from stand;with min assist   OT Frequency:  Min 2X/week    Co-evaluation PT/OT/SLP  Co-Evaluation/Treatment: Yes Reason for Co-Treatment: For patient/therapist safety;To address functional/ADL transfers PT goals addressed during session: Mobility/safety with mobility;Balance;Proper use of DME OT goals addressed during session: ADL's and self-care;Proper use of Adaptive equipment and DME      AM-PAC OT 6 Clicks Daily Activity     Outcome Measure Help from another person eating meals?: None Help from another person taking care of personal grooming?: A Little Help from another person toileting, which includes using toliet, bedpan, or urinal?: A Lot Help from another person bathing (including washing, rinsing, drying)?: A Lot Help from another person to put on and taking off regular upper body clothing?: A Little Help from another person to put on and taking off regular lower body clothing?: A Lot 6 Click Score: 16   End of Session Equipment Utilized During Treatment: Gait belt;Rolling walker (2 wheels);Other (comment) (post-op shoe) Nurse Communication: Mobility status  Activity Tolerance: Other (comment) (dizziness) Patient left: in bed;with call bell/phone within reach;with bed alarm set  OT Visit Diagnosis: Muscle weakness (generalized) (M62.81);Other abnormalities of gait and mobility (R26.89)                Time: 8378-8351 OT Time Calculation (min): 27 min Charges:  OT General Charges $OT Visit: 1 Visit OT Evaluation $OT Re-eval: 1 Re-eval  Warren SAUNDERS., MPH, MS, OTR/L ascom [phone] 01/16/24, 5:07 PM

## 2024-01-16 NOTE — Plan of Care (Signed)
  Problem: Education: Goal: Knowledge of General Education information will improve Description: Including pain rating scale, medication(s)/side effects and non-pharmacologic comfort measures Outcome: Progressing   Problem: Health Behavior/Discharge Planning: Goal: Ability to manage health-related needs will improve Outcome: Progressing   Problem: Clinical Measurements: Goal: Ability to maintain clinical measurements within normal limits will improve Outcome: Progressing Goal: Will remain free from infection Outcome: Progressing Goal: Diagnostic test results will improve Outcome: Progressing Goal: Respiratory complications will improve Outcome: Progressing Goal: Cardiovascular complication will be avoided Outcome: Progressing   Problem: Activity: Goal: Risk for activity intolerance will decrease Outcome: Progressing   Problem: Nutrition: Goal: Adequate nutrition will be maintained Outcome: Progressing   Problem: Coping: Goal: Level of anxiety will decrease Outcome: Progressing   Problem: Elimination: Goal: Will not experience complications related to bowel motility Outcome: Progressing Goal: Will not experience complications related to urinary retention Outcome: Progressing   Problem: Pain Managment: Goal: General experience of comfort will improve and/or be controlled Outcome: Progressing   Problem: Safety: Goal: Ability to remain free from injury will improve Outcome: Progressing   Problem: Skin Integrity: Goal: Risk for impaired skin integrity will decrease Outcome: Progressing   Problem: Education: Goal: Ability to demonstrate management of disease process will improve Outcome: Progressing Goal: Ability to verbalize understanding of medication therapies will improve Outcome: Progressing Goal: Individualized Educational Video(s) Outcome: Progressing   Problem: Activity: Goal: Capacity to carry out activities will improve Outcome: Progressing    Problem: Cardiac: Goal: Ability to achieve and maintain adequate cardiopulmonary perfusion will improve Outcome: Progressing   Problem: Education: Goal: Knowledge of disease and its progression will improve Outcome: Progressing Goal: Individualized Educational Video(s) Outcome: Progressing   Problem: Fluid Volume: Goal: Compliance with measures to maintain balanced fluid volume will improve Outcome: Progressing   Problem: Health Behavior/Discharge Planning: Goal: Ability to manage health-related needs will improve Outcome: Progressing   Problem: Nutritional: Goal: Ability to make healthy dietary choices will improve Outcome: Progressing   Problem: Clinical Measurements: Goal: Complications related to the disease process, condition or treatment will be avoided or minimized Outcome: Progressing

## 2024-01-16 NOTE — Progress Notes (Signed)
 Daily Progress Note   Patient Name: John Booker       Date: 01/16/2024 DOB: Aug 10, 1950  Age: 73 y.o. MRN#: [id number] Attending Physician: Josette Ade, MD Primary Care Physician: Alla Amis, MD Admit Date: 01/07/2024  Reason for Consultation/Follow-up: Establishing goals of care  Subjective: No complaints, doing well, no pain No family at bedside  Length of Stay: 9  Current Medications: Scheduled Meds:   (feeding supplement) PROSource Plus  30 mL Oral BID BM   amoxicillin -clavulanate  1 tablet Oral Q12H   vitamin C   500 mg Oral BID   aspirin  EC  81 mg Oral Daily   atorvastatin  10 mg Oral Daily   clopidogrel  75 mg Oral Daily   feeding supplement  237 mL Oral TID BM   folic acid   1 mg Oral Daily   furosemide   20 mg Oral Daily   hydrALAZINE   25 mg Oral TID   iron polysaccharides  150 mg Oral Daily   metoprolol  succinate  25 mg Oral Daily   multivitamin with minerals  1 tablet Oral Daily   pantoprazole   40 mg Oral BID   sodium chloride  flush  10-40 mL Intracatheter Q12H   thiamine   100 mg Oral Daily   Or   thiamine   100 mg Intravenous Daily   Vitamin D  (Ergocalciferol )  50,000 Units Oral Q7 days   zinc  sulfate (50mg  elemental zinc )  220 mg Oral Daily    Continuous Infusions:   PRN Meds: acetaminophen  **OR** acetaminophen , bisacodyl, diphenhydrAMINE, diphenoxylate-atropine, HYDROcodone -acetaminophen , morphine  injection, ondansetron  **OR** ondansetron  (ZOFRAN ) IV, mouth rinse, polyethylene glycol, sodium chloride  flush  Physical Exam Constitutional:      General: He is not in acute distress.    Appearance: He is ill-appearing.  Pulmonary:     Effort: Pulmonary effort is normal.  Skin:    General: Skin is warm and dry.  Neurological:     Mental Status: He is alert.  He is disoriented.             Vital Signs: BP (!) 146/89 (BP Location: Left Arm)   Pulse 85   Temp 97.8 F (36.6 C) (Oral)   Resp 15   Ht 6' 3 (1.905 m)   Wt 74.9 kg   SpO2 97%   BMI 20.64 kg/m  SpO2: SpO2: 97 % O2 Device: O2 Device: Room Air O2 Flow Rate: O2 Flow Rate (L/min): 2 L/min  Intake/output summary:  Intake/Output Summary (Last 24 hours) at 01/16/2024 1028 Last data filed at 01/16/2024 0600 Gross per 24 hour  Intake 450 ml  Output 455 ml  Net -5 ml   LBM: Last BM Date : 01/14/24 Baseline Weight: Weight: 83.9 kg Most recent weight: Weight: 74.9 kg       Palliative Assessment/Data: PPS 40%      Patient Active Problem List   Diagnosis Date Noted   Peripheral edema 01/15/2024   PAD (peripheral artery disease) 01/14/2024   Gangrene (HCC) 01/13/2024   Stricture of esophagus 01/12/2024   Esophageal dysphagia 01/11/2024   Cellulitis of left lower extremity 01/08/2024   Anasarca 01/07/2024   Acquired achalasia of esophagus - s/p esophageal dilatation 07-14-2023 07/15/2023   DNR (do not  resuscitate)/DNI(Do NOT Intubate) 07/15/2023   Hypophosphatemia 07/12/2023   Hypomagnesemia 07/12/2023   Weakness 12/13/2022   Serum total bilirubin elevated 12/13/2022   Benign paraspinal mass 11/20/2021   GERD without esophagitis 11/16/2021   Hypokalemia 11/16/2021   Hyponatremia 11/16/2021   AKI (acute kidney injury) 11/15/2021   Stage 3a chronic kidney disease (HCC) 11/07/2021   Protrusion of cervical intervertebral disc 07/23/2021   Exertional dyspnea 07/04/2021   History of alcohol abuse 07/04/2021   History of tobacco abuse 07/04/2021   Chronic diastolic CHF (congestive heart failure) (HCC) 06/26/2021   Headache 06/26/2021   Low serum albumin 05/10/2021   Protein-calorie malnutrition, moderate 12/28/2020   Esophageal dysmotility 12/28/2020   Generalized weakness 12/26/2020   Overweight (BMI 25.0-29.9) 12/20/2019   Neuropathy 04/04/2019   Pre-ulcerative  calluses 10/07/2018   Skin lesion 10/07/2018   Plantar flexed metatarsal bone of right foot 10/07/2018   Hiatal hernia    Alcohol abuse 03/06/2017   Alcohol dependence in sustained full remission (HCC) 03/05/2017   Essential hypertension 11/15/2015   Vitamin D  deficiency 11/15/2015    Palliative Care Assessment & Plan   HPI: 73 y.o. male  with past medical history of HTN, HFpEF, CKD stage II, lymphedema of bilateral lower extremities, esophageal stricture s/p dilatation, and chronic hiccups  admitted on 01/07/2024 with bilateral lower extremity and upper extremity edema which is gradually getting worse.  Lasix  infusion was initiated due to anasarca, now off.  11/4 EGD was done and esophageal stenosis was dilated.  11/6 patient underwent mechanical thrombectomy of the right SFA, tibioperoneal trunk and posterior tibial artery, stent placement right SFA, angioplasty right posterior tibial artery and tibioperoneal trunk, stent placement right tibioperoneal trunk. 11/7 patient underwent amputation of the interphalangeal joint great toe and amputation of the third toe on the right foot secondary to gangrene.  Poor p.o. intake continues due to achalasia and dysphagia.  PMT consulted to discuss goals of care.   Assessment: Follow up today with John Booker. He has no complaints or questions regarding yesterday's goals of care discussion. Call to daughter [name]. She too feels comfortable with plan moving forward - no questions or concerns. Reviewed with her that outpatient palliative has referral and will follow along.  Also reviewed with her that since goals are set, my team will not follow as closely but she has out contact info and will reach out with any questions or concerns.   Recommendations/Plan: Would never want PEG DNR/DNI Outpatient palliative to follow at SNF PMT will follow intermittently - please reach out with urgent needs, daughter has our contact info and will call as needed  Code  Status: DNR  Discharge Planning: Skilled Nursing Facility for rehab with Palliative care service follow-up  Care plan was discussed with patient and daughter  Thank you for allowing the Palliative Medicine Team to assist in the care of this patient.   Total Time 25 minutes Prolonged Time Billed  no   Time spent includes: Detailed review of medical records (labs, imaging, vital signs), medically appropriate exam, discussion with treatment team, counseling and educating patient, family and/or staff, documenting clinical information, medication management and coordination of care.     *Please note that this is a verbal dictation therefore any spelling or grammatical errors are due to the Dragon [employer] One system interpretation.  Tobey Jama Barnacle, DNP, [facility] Palliative Medicine Team Team Phone # [phone]  Pager [phone]

## 2024-01-16 NOTE — Anesthesia Postprocedure Evaluation (Signed)
 Anesthesia Post Note  Patient: John Booker  Procedure(s) Performed: AMPUTATION, TOE (Right: Toe)  Patient location during evaluation: PACU Anesthesia Type: General Level of consciousness: awake and alert Pain management: pain level controlled Vital Signs Assessment: post-procedure vital signs reviewed and stable Respiratory status: spontaneous breathing, nonlabored ventilation, respiratory function stable and patient connected to nasal cannula oxygen Cardiovascular status: blood pressure returned to baseline and stable Postop Assessment: no apparent nausea or vomiting Anesthetic complications: no   No notable events documented.   Last Vitals:  Vitals:   01/16/24 0603 01/16/24 1510  BP: (!) 146/89 (!) 143/78  Pulse: 85 98  Resp: 15 16  Temp: 36.6 C 36.7 C  SpO2: 97% (!) 83%    Last Pain:  Vitals:   01/16/24 1510  TempSrc: Oral  PainSc:                  Debby [place]

## 2024-01-17 ENCOUNTER — Encounter: Payer: Self-pay | Admitting: Podiatry

## 2024-01-17 DIAGNOSIS — L03116 Cellulitis of left lower limb: Secondary | ICD-10-CM | POA: Diagnosis not present

## 2024-01-17 DIAGNOSIS — R1319 Other dysphagia: Secondary | ICD-10-CM | POA: Diagnosis not present

## 2024-01-17 DIAGNOSIS — I96 Gangrene, not elsewhere classified: Secondary | ICD-10-CM | POA: Diagnosis not present

## 2024-01-17 DIAGNOSIS — R601 Generalized edema: Secondary | ICD-10-CM | POA: Diagnosis not present

## 2024-01-17 LAB — BASIC METABOLIC PANEL WITH GFR
Anion gap: 7 (ref 5–15)
BUN: 21 mg/dL (ref 8–23)
CO2: 31 mmol/L (ref 22–32)
Calcium: 8.3 mg/dL — ABNORMAL LOW (ref 8.9–10.3)
Chloride: 100 mmol/L (ref 98–111)
Creatinine, Ser: 0.91 mg/dL (ref 0.61–1.24)
GFR, Estimated: >60 mL/min (ref >60)
Glucose, Bld: 93 mg/dL (ref 70–99)
Potassium: 3.9 mmol/L (ref 3.5–5.1)
Sodium: 138 mmol/L (ref 135–145)

## 2024-01-17 LAB — COPPER, SERUM: Copper: 81 ug/dL (ref 69–132)

## 2024-01-17 LAB — CBC
HCT: 31.6 % — ABNORMAL LOW (ref 39.0–52.0)
Hemoglobin: 10 g/dL — ABNORMAL LOW (ref 13.0–17.0)
MCH: 31.8 pg (ref 26.0–34.0)
MCHC: 31.6 g/dL (ref 30.0–36.0)
MCV: 100.6 fL — ABNORMAL HIGH (ref 80.0–100.0)
Platelets: 270 K/uL (ref 150–400)
RBC: 3.14 MIL/uL — ABNORMAL LOW (ref 4.22–5.81)
RDW: 12.2 % (ref 11.5–15.5)
WBC: 7 K/uL (ref 4.0–10.5)
nRBC: 0 % (ref 0.0–0.2)

## 2024-01-17 MED ORDER — ALUM & MAG HYDROXIDE-SIMETH 200-200-20 MG/5ML PO SUSP
30.0000 mL | ORAL | Status: DC | PRN
  Administered 2024-01-17: 30 mL via ORAL
  Filled 2024-01-17: qty 30

## 2024-01-17 NOTE — Progress Notes (Signed)
 Progress Note   Patient: John Booker FMW:[id number] DOB: [birth year] DOA: 01/07/2024     10 DOS: the patient was seen and examined on 01/17/2024   Brief hospital course: 73 y.o. male with Past medical history of HTN, HFpEF, CKD stage II, lymphedema of bilateral lower extremities, esophageal stricture s/p dilatation, chronic hiccups as reviewed from EMR, presented at [facility] ED with complaining of bilateral lower extremity and upper extremity edema which is gradually getting worse.  Patient is not a very good historian, as per patient's daughter lower extremity edema is gradually getting worse for the past 1 week and it is weeping.  There is an area of erythema in the left lower extremity on the lateral side which he scratched maybe had trauma.  Patient reported no fever.  While patient was getting ready to come to the ED, he fell and landed on his buttocks, no LOC.  Denies any pain after fall.   11/3.  Vascular surgery brought to the procedure suite for angiogram.  No intervention was done secondary to the patient needing an endoscopy and needing to be off anticoagulation.  The patient does have peripheral vascular disease.  Extensive lower extremity revascularization at a later date. 11/4.  EGD was done by Dr. Jinny and a benign-appearing esophageal stenosis was dilated. 11/5.  Patient still having trouble eating.  Case discussed with gastroenterology and will get speech pathology to evaluate for potential barium swallow.  May need manometry as outpatient. 11/6.  Dr. Marea did a mechanical thrombectomy of the right SFA, tibioperoneal trunk and posterior tibial artery, stent placement right SFA, angioplasty right posterior tibial artery and tibioperoneal trunk, stent placement right tibioperoneal trunk. 11/7.  Dr. Ashley did an amputation of the interphalangeal joint great toe and amputation of the third toe on the right foot secondary to gangrene. 11/8.  No pain in right foot after amputation.  Podiatry  cleared to get out of the hospital.  Looking into rehab beds. 11/9.  Medically stable to go out to rehab once bed available and insurance authorizes.  Assessment and Plan: * Anasarca Initially was started on Lasix  drip.  Was off diuretics for procedures.  Restarted low-dose Lasix  on 11/8.  Wound care team to evaluate for compression wraps tomorrow.  PAD (peripheral artery disease) 11/6. Patient had mechanical thrombectomy of the right SFA, tibioperoneal trunk and posterior tibial artery, stent placement to right SFA, angioplasty of right posterior tibial artery and tibioperoneal trunk, stent placement right tibioperoneal trunk.  On aspirin , Plavix and Lipitor.  Vascular team recommended compression wraps upon discharge, consulted wound care nurse.  Cellulitis of left lower extremity Cellulitis of bilateral lower extremities.  Gangrene right third toe and part of the first toe,  Left foot ulcer.  Also on the left foot areas of scabs which could be starting of gangrene.  Patient received vancomycin  and Rocephin  and switched over to Augmentin .  Had toe amputations 11/7 by Dr. Ashley.  Podiatry recommended continuing Augmentin  till the 14th.  Esophageal dysphagia Continue PPI.  EGD with dilation on 11/4.  Appreciate speech pathology consultation.  Continue Protonix .  May end up needing outpatient manometry.  Stricture of esophagus Dilated on 11/4.  Continue PPI.  Hypomagnesemia Treated  AKI (acute kidney injury) Resolved.  Hypokalemia Treated  Hyponatremia Normalized  Protein-calorie malnutrition, moderate Continue supplements        Subjective: Patient states that he goes to the bathroom every time he eats.  No pain in his foot.  Initially admitted with cellulitis and  gangrene.  Physical Exam: Vitals:   01/16/24 1950 01/17/24 0352 01/17/24 0757 01/17/24 1037  BP: (!) 140/70 135/69 (!) 140/79 127/69  Pulse: 97 60 86 88  Resp: 16 16 18    Temp: 98 F (36.7 C) 98.3 F (36.8  C) 97.6 F (36.4 C)   TempSrc: Oral Oral Oral   SpO2: 91% 93% 100%   Weight:      Height:       Physical Exam HENT:     Head: Normocephalic.     Mouth/Throat:     Pharynx: No oropharyngeal exudate.  Eyes:     General: Lids are normal.     Conjunctiva/sclera: Conjunctivae normal.  Cardiovascular:     Rate and Rhythm: Normal rate and regular rhythm.     Heart sounds: S1 normal and S2 normal. Murmur heard.     Systolic murmur is present with a grade of 2/6.  Pulmonary:     Breath sounds: No decreased breath sounds, wheezing, rhonchi or rales.  Abdominal:     Palpations: Abdomen is soft.     Tenderness: There is no abdominal tenderness.  Musculoskeletal:     Right lower leg: Swelling present.     Left lower leg: Swelling present.  Skin:    General: Skin is warm.     Comments: Right foot wrapped.  Chronic lower extremity skin discoloration.  Neurological:     Mental Status: He is alert.     Data Reviewed: Creatinine 0.91, electrolytes normal range, white blood cell count 7, hemoglobin 10, platelet count 270  Family Communication: Left message for daughter  Disposition: Status is: Inpatient Remains inpatient appropriate because: Medically stable for discharge once we get a rehab bed and insurance authorization  Planned Discharge Destination: Rehab    Time spent: 27 minutes  Author: Charlie Patterson, MD 01/17/2024 1:11 PM  For on call review [email or web address].

## 2024-01-17 NOTE — Plan of Care (Signed)
  Problem: Education: Goal: Knowledge of General Education information will improve Description: Including pain rating scale, medication(s)/side effects and non-pharmacologic comfort measures Outcome: Progressing   Problem: Health Behavior/Discharge Planning: Goal: Ability to manage health-related needs will improve Outcome: Progressing   Problem: Clinical Measurements: Goal: Ability to maintain clinical measurements within normal limits will improve Outcome: Progressing Goal: Will remain free from infection Outcome: Progressing Goal: Diagnostic test results will improve Outcome: Progressing Goal: Respiratory complications will improve Outcome: Progressing Goal: Cardiovascular complication will be avoided Outcome: Progressing   Problem: Activity: Goal: Risk for activity intolerance will decrease Outcome: Progressing   Problem: Nutrition: Goal: Adequate nutrition will be maintained Outcome: Progressing   Problem: Coping: Goal: Level of anxiety will decrease Outcome: Progressing   Problem: Elimination: Goal: Will not experience complications related to bowel motility Outcome: Progressing Goal: Will not experience complications related to urinary retention Outcome: Progressing   Problem: Pain Managment: Goal: General experience of comfort will improve and/or be controlled Outcome: Progressing   Problem: Safety: Goal: Ability to remain free from injury will improve Outcome: Progressing   Problem: Skin Integrity: Goal: Risk for impaired skin integrity will decrease Outcome: Progressing   Problem: Education: Goal: Ability to demonstrate management of disease process will improve Outcome: Progressing Goal: Ability to verbalize understanding of medication therapies will improve Outcome: Progressing Goal: Individualized Educational Video(s) Outcome: Progressing   Problem: Activity: Goal: Capacity to carry out activities will improve Outcome: Progressing    Problem: Cardiac: Goal: Ability to achieve and maintain adequate cardiopulmonary perfusion will improve Outcome: Progressing   Problem: Education: Goal: Knowledge of disease and its progression will improve Outcome: Progressing Goal: Individualized Educational Video(s) Outcome: Progressing   Problem: Fluid Volume: Goal: Compliance with measures to maintain balanced fluid volume will improve Outcome: Progressing   Problem: Health Behavior/Discharge Planning: Goal: Ability to manage health-related needs will improve Outcome: Progressing   Problem: Nutritional: Goal: Ability to make healthy dietary choices will improve Outcome: Progressing   Problem: Clinical Measurements: Goal: Complications related to the disease process, condition or treatment will be avoided or minimized Outcome: Progressing

## 2024-01-17 NOTE — TOC Progression Note (Addendum)
 Transition of Care [facility]) - Progression Note    Patient Details  Name: John Booker MRN: [id number] Date of Birth: 04-27-1950  [facility]) CM/SW Contact  Victory Jackquline RAMAN, RN Phone Number: 01/17/2024, 11:46 AM  Clinical Narrative:   RNCM reviewed the chart. Called patient's daughter [name] @ [phone] to discuss bed offers. No answer, left a message. Awaiting a call back. RNCM will continue to follow for discharge planning needs.  12:22 pm: RNCM received a call back from patient's daughter [name]. I reviewed the bed offers with her and she said that she would like to wait to see if any of the ones that are pending accept him. First choice is [address], [place], [address] then [place]. Doesn't want him to go to [place] at all. RNCM to follow up with Caressa on Monday.    Expected Discharge Plan: Home w Home Health Services Barriers to Discharge: Continued Medical Work up               Expected Discharge Plan and Services     Post Acute Care Choice: Home Health Living arrangements for the past 2 months: Single Family Home                                       Social Drivers of Health (SDOH) Interventions SDOH Screenings   Food Insecurity: No Food Insecurity (01/07/2024)  Housing: Low Risk  (01/07/2024)  Transportation Needs: No Transportation Needs (01/07/2024)  Utilities: Not At Risk (01/07/2024)  Depression (PHQ2-9): Low Risk  (12/12/2021)  Financial Resource Strain: Low Risk  (05/07/2023)   Received from [facility]  Social Connections: Socially Isolated (01/07/2024)  Tobacco Use: Medium Risk (01/14/2024)    Readmission Risk Interventions    07/13/2023   12:44 PM  Readmission Risk Prevention Plan  Transportation Screening Complete  PCP or Specialist Appt within 5-7 Days Complete  Medication Review (RN CM) Complete

## 2024-01-18 DIAGNOSIS — R197 Diarrhea, unspecified: Secondary | ICD-10-CM | POA: Diagnosis not present

## 2024-01-18 DIAGNOSIS — I951 Orthostatic hypotension: Secondary | ICD-10-CM | POA: Diagnosis not present

## 2024-01-18 DIAGNOSIS — I739 Peripheral vascular disease, unspecified: Secondary | ICD-10-CM | POA: Diagnosis not present

## 2024-01-18 DIAGNOSIS — R601 Generalized edema: Secondary | ICD-10-CM | POA: Diagnosis not present

## 2024-01-18 LAB — VITAMIN A: Vitamin A (Retinoic Acid): 44.8 ug/dL (ref 22.0–69.5)

## 2024-01-18 MED ORDER — SODIUM CHLORIDE 0.9 % IV SOLN: INTRAVENOUS | Status: DC

## 2024-01-18 MED ORDER — ZINC OXIDE 40 % EX OINT
TOPICAL_OINTMENT | Freq: Four times a day (QID) | CUTANEOUS | Status: DC | PRN
  Filled 2024-01-18: qty 113

## 2024-01-18 MED ORDER — LOPERAMIDE HCL 2 MG PO CAPS
2.0000 mg | ORAL_CAPSULE | Freq: Three times a day (TID) | ORAL | Status: DC | PRN
  Administered 2024-01-18: 2 mg via ORAL

## 2024-01-18 MED ORDER — LOPERAMIDE HCL 2 MG PO CAPS
4.0000 mg | ORAL_CAPSULE | Freq: Once | ORAL | Status: AC
  Administered 2024-01-18: 4 mg via ORAL
  Filled 2024-01-18: qty 2

## 2024-01-18 MED ORDER — SODIUM CHLORIDE 0.9 % IV BOLUS
500.0000 mL | Freq: Once | INTRAVENOUS | Status: AC
  Administered 2024-01-18: 500 mL via INTRAVENOUS

## 2024-01-18 MED ORDER — RISAQUAD PO CAPS
2.0000 | ORAL_CAPSULE | Freq: Every day | ORAL | Status: DC
  Administered 2024-01-18 – 2024-01-21 (×4): 2 via ORAL
  Filled 2024-01-18 (×4): qty 2

## 2024-01-18 NOTE — Assessment & Plan Note (Signed)
 IV fluid bolus.  Hold Lasix .  Hold Toprol .  Hold hydralazine .

## 2024-01-18 NOTE — TOC Initial Note (Signed)
 Transition of Care [facility]) - Initial/Assessment Note    Patient Details  Name: John Booker MRN: [id number] Date of Birth: [birth year]  Transition of Care [facility]) CM/SW Contact:    Alfonso Rummer, LCSW Phone Number: 01/18/2024, 4:50 PM  Clinical Narrative:                 Pt and spouse advised [facility] did not extend bed offer. Pt agree to [facility] [employer]. Insurance auth approved Approved [id number] Dates:11/10-11/02/2024 Next Review Date: 01/20/2024 . Admission rep report pt can transition to [facility] 01/19/24  Expected Discharge Plan: Home w Home Health Services Barriers to Discharge: Continued Medical Work up   Patient Goals and CMS Choice            Expected Discharge Plan and Services     Post Acute Care Choice: Home Health Living arrangements for the past 2 months: Single Family Home Expected Discharge Date: 01/17/24                                    Prior Living Arrangements/Services Living arrangements for the past 2 months: Single Family Home Lives with:: Self Patient language and need for interpreter reviewed:: Yes Do you feel safe going back to the place where you live?: Yes      Need for Family Participation in Patient Care: Yes (Comment)   Current home services: DME Criminal Activity/Legal Involvement Pertinent to Current Situation/Hospitalization: No - Comment as needed  Activities of Daily Living   ADL Screening (condition at time of admission) Independently performs ADLs?: Yes (appropriate for developmental age) Is the patient deaf or have difficulty hearing?: No Does the patient have difficulty seeing, even when wearing glasses/contacts?: No Does the patient have difficulty concentrating, remembering, or making decisions?: No  Permission Sought/Granted Permission sought to share information with : Facility Medical Sales Representative, Family Supports Permission granted to share information with : Yes, Verbal Permission  Granted  Share Information with NAME: [name] Le  Permission granted to share info w AGENCY: Home Health Agencies  Permission granted to share info w Relationship: Daughter  Permission granted to share info w Contact Information: [phone]  Emotional Assessment Appearance:: Appears stated age Attitude/Demeanor/Rapport: Engaged   Orientation: : Oriented to Self, Oriented to Place, Oriented to  Time, Oriented to Situation Alcohol / Substance Use: Not Applicable Psych Involvement: No (comment)  Admission diagnosis:  Anasarca [R60.1] Peripheral edema [R60.0] Cellulitis of left lower extremity [L03.116] Patient Active Problem List   Diagnosis Date Noted   Orthostatic hypotension 01/18/2024   Diarrhea 01/18/2024   PAD (peripheral artery disease) 01/14/2024   Gangrene (HCC) 01/13/2024   Stricture of esophagus 01/12/2024   Esophageal dysphagia 01/11/2024   Cellulitis of left lower extremity 01/08/2024   Anasarca 01/07/2024   Acquired achalasia of esophagus - s/p esophageal dilatation 07-14-2023 07/15/2023   DNR (do not resuscitate)/DNI(Do NOT Intubate) 07/15/2023   Hypophosphatemia 07/12/2023   Hypomagnesemia 07/12/2023   Weakness 12/13/2022   Serum total bilirubin elevated 12/13/2022   Benign paraspinal mass 11/20/2021   GERD without esophagitis 11/16/2021   Hypokalemia 11/16/2021   Hyponatremia 11/16/2021   AKI (acute kidney injury) 11/15/2021   Stage 3a chronic kidney disease (HCC) 11/07/2021   Protrusion of cervical intervertebral disc 07/23/2021   Exertional dyspnea 07/04/2021   History of alcohol abuse 07/04/2021   History of tobacco abuse 07/04/2021   Chronic diastolic CHF (congestive heart failure) (HCC) 06/26/2021  Headache 06/26/2021   Low serum albumin 05/10/2021   Protein-calorie malnutrition, moderate 12/28/2020   Esophageal dysmotility 12/28/2020   Generalized weakness 12/26/2020   Overweight (BMI 25.0-29.9) 12/20/2019   Neuropathy 04/04/2019    Pre-ulcerative calluses 10/07/2018   Skin lesion 10/07/2018   Plantar flexed metatarsal bone of right foot 10/07/2018   Hiatal hernia    Alcohol abuse 03/06/2017   Alcohol dependence in sustained full remission (HCC) 03/05/2017   Essential hypertension 11/15/2015   Vitamin D  deficiency 11/15/2015   PCP:  Alla Amis, MD Pharmacy:   [facility] - [place], [place] - [address] [place] [address] Phone: [phone] Fax: [phone]  [facility] [address], [place] - [address] [place] [address] Phone: [phone] Fax: [phone]     Social Drivers of Health (SDOH) Social History: SDOH Screenings   Food Insecurity: No Food Insecurity (01/07/2024)  Housing: Low Risk  (01/07/2024)  Transportation Needs: No Transportation Needs (01/07/2024)  Utilities: Not At Risk (01/07/2024)  Depression (PHQ2-9): Low Risk  (12/12/2021)  Financial Resource Strain: Low Risk  (05/07/2023)   [facility]  Social Connections: Socially Isolated (01/07/2024)  Tobacco Use: Medium Risk (01/14/2024)   SDOH Interventions: Social Connections Interventions: Inpatient TOC (Resources added to AVS.)   Readmission Risk Interventions    07/13/2023   12:44 PM  Readmission Risk Prevention Plan  Transportation Screening Complete  PCP or Specialist Appt within 5-7 Days Complete  Medication Review (RN CM) Complete

## 2024-01-18 NOTE — Progress Notes (Signed)
 Occupational Therapy Treatment Patient Details Name: John Booker MRN: [id number] DOB: 11-Sep-1950 Today's Date: 01/18/2024   History of present illness Pt is a 73 y/o M admitted on 01/07/24 following a fall, c/o worsening BLE & BUE edema. Pt is being treated for anasarca, BLE cellulitis, ischemic R 1st toe ulcer & ischemia of 3rd toe. PMH: HTN, HFpEF, CKD 2, lymphedema of BLE, chronic hiccups, EtOH abuse.  Hospital course significant for R LE aortogram/angiogram (11/3), RR LE thrombectomy (11/6), R great toe, 3rd toe amputation and 5th MTPJ debridement (11/7).  Cleared per podiatry for [facility] short-distance with post-op shoe to R LE.   OT comments  Pt seen for OT and PT co-tx. Pt denies significant complaints, agreeable. Pt required MIN A +2 to stand with RW unable to participate in pericare 2/2 decreased balance in standing and dizziness. MAX A for pericare after incontinent BM. Unclear whether he was aware of it or not. Continues to be limited by dizziness with positional changes.       If plan is discharge home, recommend the following:  Two people to help with walking and/or transfers;Two people to help with bathing/dressing/bathroom;Supervision due to cognitive status;Direct supervision/assist for medications management;Help with stairs or ramp for entrance;Assist for transportation;Assistance with cooking/housework   Equipment Recommendations  Other (comment) (defer to next venue)    Recommendations for Other Services      Precautions / Restrictions Precautions Precautions: Fall Recall of Precautions/Restrictions: Intact Required Braces or Orthoses: Other Brace Other Brace: post-op shoe Restrictions Weight Bearing Restrictions Per Provider Order: Yes RLE Weight Bearing Per Provider Order: Weight bearing as tolerated Other Position/Activity Restrictions: WBAT + post-op shoe       Mobility Bed Mobility Overal bed mobility: Needs Assistance Bed Mobility: Supine to Sit      Supine to sit: Supervision, HOB elevated, Used rails          Transfers Overall transfer level: Needs assistance Equipment used: Rolling walker (2 wheels) Transfers: Sit to/from Stand, Bed to chair/wheelchair/BSC Sit to Stand: Min assist, From elevated surface, +2 physical assistance Stand pivot transfers: Min assist         General transfer comment: VC for hand placement with each transfer attempt, increased effort/time     Balance Overall balance assessment: Needs assistance Sitting-balance support: No upper extremity supported, Feet supported Sitting balance-Leahy Scale: Good     Standing balance support: During functional activity, Single extremity supported, Reliant on assistive device for balance Standing balance-Leahy Scale: Fair                             ADL either performed or assessed with clinical judgement   ADL Overall ADL's : Needs assistance/impaired     Grooming: Sitting;Set up;Wash/dry face;Wash/dry hands           Upper Body Dressing : Sitting;Minimal assistance Upper Body Dressing Details (indicate cue type and reason): gown change Lower Body Dressing: Sit to/from stand;Maximal assistance   Toilet Transfer: Minimal assistance;Rolling walker (2 wheels);Stand-pivot;+2 for physical assistance Toilet Transfer Details (indicate cue type and reason): simulated, step pivot to recliner Toileting- Clothing Manipulation and Hygiene: Sit to/from stand;Maximal assistance Toileting - Clothing Manipulation Details (indicate cue type and reason): MAX A for standing pericare after incontinent BM, required BUE support on the RW for stability            Extremity/Trunk Assessment              Vision  Perception     Praxis     Communication Communication Communication: Impaired Factors Affecting Communication: Hearing impaired   Cognition Arousal: Alert Behavior During Therapy: Anxious                                  Following commands: Impaired Following commands impaired: Follows one step commands with increased time, Follows multi-step commands with increased time      Cueing   Cueing Techniques: Verbal cues, Gestural cues, Tactile cues, Visual cues  Exercises      Shoulder Instructions       General Comments Pt with incontinent BM, provided total assist for peri hygiene. Pt noted to have LUE edema, LUE placed on pillows at end of session & PT encouraged ROM.    Pertinent Vitals/ Pain       Pain Assessment Pain Assessment: Faces Faces Pain Scale: Hurts even more Pain Location: RLE Pain Descriptors / Indicators: Grimacing, Discomfort, Guarding Pain Intervention(s): Limited activity within patient's tolerance, Monitored during session, Repositioned  Home Living                                          Prior Functioning/Environment              Frequency  Min 2X/week        Progress Toward Goals  OT Goals(current goals can now be found in the care plan section)  Progress towards OT goals: Progressing toward goals  Acute Rehab OT Goals Patient Stated Goal: get better and go home OT Goal Formulation: With patient Time For Goal Achievement: 01/30/24 Potential to Achieve Goals: Fair  Plan      Co-evaluation    PT/OT/SLP Co-Evaluation/Treatment: Yes Reason for Co-Treatment: For patient/therapist safety;To address functional/ADL transfers PT goals addressed during session: Mobility/safety with mobility;Balance;Proper use of DME OT goals addressed during session: ADL's and self-care;Proper use of Adaptive equipment and DME      AM-PAC OT 6 Clicks Daily Activity     Outcome Measure   Help from another person eating meals?: None Help from another person taking care of personal grooming?: A Little Help from another person toileting, which includes using toliet, bedpan, or urinal?: A Lot Help from another person bathing (including washing,  rinsing, drying)?: A Lot Help from another person to put on and taking off regular upper body clothing?: A Little Help from another person to put on and taking off regular lower body clothing?: A Lot 6 Click Score: 16    End of Session Equipment Utilized During Treatment: Gait belt;Rolling walker (2 wheels);Other (comment) (post op shoe on R foot)  OT Visit Diagnosis: Muscle weakness (generalized) (M62.81);Other abnormalities of gait and mobility (R26.89)   Activity Tolerance Other (comment) (dizziness)   Patient Left in chair;with call bell/phone within reach;with chair alarm set;with family/visitor present   Nurse Communication          Time: 8568-8551 OT Time Calculation (min): 17 min  Charges: OT General Charges $OT Visit: 1 Visit OT Treatments $Self Care/Home Management : 8-22 mins  Warren SAUNDERS., MPH, MS, OTR/L ascom [phone] 01/18/24, 4:58 PM

## 2024-01-18 NOTE — Consult Note (Addendum)
 WOC Nurse Consult Note: Reason for Consult: Refer to vascular team consult on 11/7. They requested bilat Una boots be applied, but patient was having surgery for gangrene to right toes and on that day and is followed by the podiatry team for that location and they have provided the plan of care as outlined below. Attempted to apply bilat Una boots as requested  this morning and patient refused. He states,  I do not want tight wraps on that will cut off my circulation and I don't want something on that is only changed once a week.   I will not apply Una boots since the patient has refused.  Right foot dressing in place as ordered by podiatry team.  Please refer to their team for further questions regarding plan of care.  Bilat calves with moderate amt edema, no weeping.  Scattered areas of full thickness wounds and dry brown scabs. No significant open wounds.    Topical treatment orders provided for bedside nurses to perform as follows:  1. Foam dressings to bilat leg wounds, change Q 3 days or PRN soiling. 2. Podiatry team dressing orders for right foot: Xeroform to the incisional areas, followed by 4 x 4 gauze and gauze roll to the right foot and Mepilex foam borders to the right heel, Ace wrap to the right foot.  Please re-consult if further assistance is needed.  Thank-you,  Stephane Fought MSN, RN, CWOCN, CWCN-AP, CNS Contact Mon-Fri 0700-1500: [phone]

## 2024-01-18 NOTE — Assessment & Plan Note (Signed)
 Likely secondary to antibiotics.  Discontinue antibiotics on 11/10 at patient request.  As needed Imodium .  White count normal and no fever.  No indication for C. difficile testing.  Continue probiotic.

## 2024-01-18 NOTE — Progress Notes (Signed)
 Physical Therapy Treatment Patient Details Name: John Booker MRN: [id number] DOB: 18-Jan-1951 Today's Date: 01/18/2024   History of Present Illness Pt is a 73 y/o M admitted on 01/07/24 following a fall, c/o worsening BLE & BUE edema. Pt is being treated for anasarca, BLE cellulitis, ischemic R 1st toe ulcer & ischemia of 3rd toe. PMH: HTN, HFpEF, CKD 2, lymphedema of BLE, chronic hiccups, EtOH abuse.  Hospital course significant for R LE aortogram/angiogram (11/3), RR LE thrombectomy (11/6), R great toe, 3rd toe amputation and 5th MTPJ debridement (11/7).  Cleared per podiatry for [facility] short-distance with post-op shoe to R LE.    PT Comments  Pt seen for PT with pt agreeable. Pt able to transfer to recliner with RW & min assist on this date but further mobility deferred 2/2 symptomatic orthostatic hypotension (MD notified). Provided assistance for peri hygiene & changing into clean gown. Encouraged OOB mobility & drink fluids as able.  Will continue to follow pt acutely & progress mobility as able.  BP checked in RUE  BP HR  Supine  145/79 mmHg MAP 95 81 bpm  Sitting (symptomatic) 129/65 mmHg MAP 85 86 bpm  Standing at 0 (symptomatic) 95/52 mmHg MAP 64 92 bpm  Sitting in recliner 147/70 mmHg MAP 94 91 bpm      If plan is discharge home, recommend the following: A little help with walking and/or transfers;A lot of help with bathing/dressing/bathroom;Assistance with cooking/housework;Assist for transportation;Help with stairs or ramp for entrance   Can travel by private vehicle     Yes  Equipment Recommendations  Rolling walker (2 wheels);BSC/3in1    Recommendations for Other Services Rehab consult     Precautions / Restrictions Precautions Precautions: Fall Required Braces or Orthoses: Other Brace Other Brace: post-op shoe Restrictions Weight Bearing Restrictions Per Provider Order: Yes RLE Weight Bearing Per Provider Order: Weight bearing as tolerated     Mobility  Bed  Mobility Overal bed mobility: Needs Assistance Bed Mobility: Supine to Sit     Supine to sit: Supervision, HOB elevated, Used rails (exit L side of bed)          Transfers Overall transfer level: Needs assistance Equipment used: Rolling walker (2 wheels) Transfers: Sit to/from Stand Sit to Stand: Min assist, From elevated surface, +2 physical assistance (from elevated EOB) Stand pivot transfers: Min assist (bed>recliner on L with RW)              Ambulation/Gait                   Stairs             Wheelchair Mobility     Tilt Bed    Modified Rankin (Stroke Patients Only)       Balance Overall balance assessment: Needs assistance Sitting-balance support: No upper extremity supported, Feet supported Sitting balance-Leahy Scale: Good     Standing balance support: During functional activity, Single extremity supported, Reliant on assistive device for balance Standing balance-Leahy Scale: Fair                              Hotel Manager: Impaired Factors Affecting Communication: Hearing impaired  Cognition Arousal: Alert Behavior During Therapy: Anxious   PT - Cognitive impairments: Memory, Problem solving, Safety/Judgement                       PT - Cognition Comments: Daughter reports  pt memory has generally declined. Following commands: Impaired Following commands impaired: Follows one step commands with increased time, Follows multi-step commands with increased time    Cueing Cueing Techniques: Verbal cues, Gestural cues, Tactile cues, Visual cues  Exercises General Exercises - Lower Extremity Long Arc Quad: AROM, Seated, Strengthening, Right, 20 reps    General Comments General comments (skin integrity, edema, etc.): Pt with incontinent BM, provided total assist for peri hygiene. Pt noted to have LUE edema, LUE placed on pillows at end of session & PT encouraged ROM.      Pertinent  Vitals/Pain Pain Assessment Pain Assessment: Faces Faces Pain Scale: Hurts even more Pain Location: RLE Pain Descriptors / Indicators: Grimacing, Discomfort, Guarding Pain Intervention(s): Monitored during session, Limited activity within patient's tolerance, Repositioned    Home Living                          Prior Function            PT Goals (current goals can now be found in the care plan section) Acute Rehab PT Goals Patient Stated Goal: decreased pain, get better PT Goal Formulation: With patient Time For Goal Achievement: 01/30/24 Potential to Achieve Goals: Fair Progress towards PT goals: Progressing toward goals    Frequency    Min 2X/week      PT Plan      Co-evaluation PT/OT/SLP Co-Evaluation/Treatment: Yes Reason for Co-Treatment: For patient/therapist safety;To address functional/ADL transfers PT goals addressed during session: Mobility/safety with mobility;Balance;Proper use of DME        AM-PAC PT 6 Clicks Mobility   Outcome Measure  Help needed turning from your back to your side while in a flat bed without using bedrails?: None Help needed moving from lying on your back to sitting on the side of a flat bed without using bedrails?: A Little Help needed moving to and from a bed to a chair (including a wheelchair)?: A Little Help needed standing up from a chair using your arms (e.g., wheelchair or bedside chair)?: A Little Help needed to walk in hospital room?: A Little Help needed climbing 3-5 steps with a railing? : A Lot 6 Click Score: 18    End of Session   Activity Tolerance: Patient tolerated treatment well;Other (comment) (limited 2/2 symptomatic orthostatic hypotension) Patient left: in chair;with chair alarm set;with call bell/phone within reach;with family/visitor present Nurse Communication: Mobility status PT Visit Diagnosis: Muscle weakness (generalized) (M62.81);Pain;Difficulty in walking, not elsewhere classified  (R26.2);Other abnormalities of gait and mobility (R26.89) Pain - Right/Left: Right Pain - part of body: Leg     Time: 1431-1455 PT Time Calculation (min) (ACUTE ONLY): 24 min  Charges:    $Therapeutic Activity: 8-22 mins PT General Charges $$ ACUTE PT VISIT: 1 Visit                     [name], PT, DPT 01/18/24, 3:43 PM   [name] 01/18/2024, 3:41 PM

## 2024-01-18 NOTE — Progress Notes (Signed)
 Daily Progress Note   Patient Name: John Booker       Date: 01/18/2024 DOB: [birth year]  Age: 73 y.o. MRN#: [id number] Attending Physician: Josette Ade, MD Primary Care Physician: Alla Amis, MD Admit Date: 01/07/2024  Reason for Consultation/Follow-up: Establishing goals of care  Subjective: Notes and labs reviewed in detail.  Diagnostics reviewed.  In to see patient.  He discusses frustrations with not being able to eat and drink.  He states he has issues with swallowing things down, and then issues with whatever he swallows coming back up.  He states he wants this resolved.  He states he would never want to have a feeding tube in the future.    He discusses that while here,  they cut some of my toes off, but denies pain or complaint with his legs or foot.  Length of Stay: 11  Current Medications: Scheduled Meds:  . (feeding supplement) PROSource Plus  30 mL Oral BID BM  . amoxicillin -clavulanate  1 tablet Oral Q12H  . vitamin C   500 mg Oral BID  . aspirin  EC  81 mg Oral Daily  . atorvastatin  10 mg Oral Daily  . clopidogrel  75 mg Oral Daily  . feeding supplement  237 mL Oral TID BM  . folic acid   1 mg Oral Daily  . furosemide   20 mg Oral Daily  . hydrALAZINE   25 mg Oral TID  . iron polysaccharides  150 mg Oral Daily  . metoprolol  succinate  25 mg Oral Daily  . multivitamin with minerals  1 tablet Oral Daily  . pantoprazole   40 mg Oral BID  . sodium chloride  flush  10-40 mL Intracatheter Q12H  . thiamine   100 mg Oral Daily   Or  . thiamine   100 mg Intravenous Daily  . Vitamin D  (Ergocalciferol )  50,000 Units Oral Q7 days  . zinc  sulfate (50mg  elemental zinc )  220 mg Oral Daily    Continuous Infusions:   PRN Meds: acetaminophen  **OR** acetaminophen ,  alum & mag hydroxide-simeth, diphenhydrAMINE, diphenoxylate-atropine, HYDROcodone -acetaminophen , liver oil-zinc  oxide, morphine  injection, ondansetron  **OR** ondansetron  (ZOFRAN ) IV, mouth rinse, sodium chloride  flush  Physical Exam Pulmonary:     Effort: Pulmonary effort is normal.  Abdominal:     Comments: Belching noted while talking.  Skin:    General: Skin is warm  and dry.  Neurological:     Mental Status: He is alert.             Vital Signs: BP (!) 151/78 (BP Location: Right Arm)   Pulse 74   Temp 97.7 F (36.5 C) (Oral)   Resp 16   Ht 6' 3 (1.905 m)   Wt 74.9 kg   SpO2 100%   BMI 20.64 kg/m  SpO2: SpO2: 100 % O2 Device: O2 Device: Room Air O2 Flow Rate: O2 Flow Rate (L/min): 2 L/min  Intake/output summary:  Intake/Output Summary (Last 24 hours) at 01/18/2024 1201 Last data filed at 01/18/2024 0900 Gross per 24 hour  Intake 1400 ml  Output 675 ml  Net 725 ml   LBM: Last BM Date : 01/17/24 Baseline Weight: Weight: 83.9 kg Most recent weight: Weight: 74.9 kg  Patient Active Problem List   Diagnosis Date Noted  . Peripheral edema 01/15/2024  . PAD (peripheral artery disease) 01/14/2024  . Gangrene (HCC) 01/13/2024  . Stricture of esophagus 01/12/2024  . Esophageal dysphagia 01/11/2024  . Cellulitis of left lower extremity 01/08/2024  . Anasarca 01/07/2024  . Acquired achalasia of esophagus - s/p esophageal dilatation 07-14-2023 07/15/2023  . DNR (do not resuscitate)/DNI(Do NOT Intubate) 07/15/2023  . Hypophosphatemia 07/12/2023  . Hypomagnesemia 07/12/2023  . Weakness 12/13/2022  . Serum total bilirubin elevated 12/13/2022  . Benign paraspinal mass 11/20/2021  . GERD without esophagitis 11/16/2021  . Hypokalemia 11/16/2021  . Hyponatremia 11/16/2021  . AKI (acute kidney injury) 11/15/2021  . Stage 3a chronic kidney disease (HCC) 11/07/2021  . Protrusion of cervical intervertebral disc 07/23/2021  . Exertional dyspnea 07/04/2021  . History of alcohol  abuse 07/04/2021  . History of tobacco abuse 07/04/2021  . Chronic diastolic CHF (congestive heart failure) (HCC) 06/26/2021  . Headache 06/26/2021  . Low serum albumin 05/10/2021  . Protein-calorie malnutrition, moderate 12/28/2020  . Esophageal dysmotility 12/28/2020  . Generalized weakness 12/26/2020  . Overweight (BMI 25.0-29.9) 12/20/2019  . Neuropathy 04/04/2019  . Pre-ulcerative calluses 10/07/2018  . Skin lesion 10/07/2018  . Plantar flexed metatarsal bone of right foot 10/07/2018  . Hiatal hernia   . Alcohol abuse 03/06/2017  . Alcohol dependence in sustained full remission (HCC) 03/05/2017  . Essential hypertension 11/15/2015  . Vitamin D  deficiency 11/15/2015    Palliative Care Assessment & Plan     Recommendations/Plan:  Patient would never want a feeding tube. Continue to treat the treatable  At code Status:    Code Status Orders  (From admission, onward)           Start     Ordered   01/07/24 1332  Do not attempt resuscitation (DNR)- Limited -Do Not Intubate (DNI)  Continuous       Question Answer Comment  If pulseless and not breathing No CPR or chest compressions.   In Pre-Arrest Conditions (Patient Is Breathing and Has A Pulse) Do not intubate. Provide all appropriate non-invasive medical interventions. Avoid ICU transfer unless indicated or required.   Consent: Discussion documented in EHR or advanced directives reviewed      01/07/24 1333           Code Status History     Date Active Date Inactive Code Status Order ID Comments User Context   07/10/2023 1844 07/15/2023 2139 Limited: Do not attempt resuscitation (DNR) -DNR-LIMITED -Do Not Intubate/DNI  [id number]  CoxGreig SAILOR, DO ED   07/10/2023 1707 07/10/2023 1844 Full Code [id number]  Cox, Amy N, DO  ED   12/13/2022 1740 12/14/2022 1947 Full Code [id number]  CoxGreig SAILOR, DO ED   11/23/2022 0355 11/26/2022 2110 Do not attempt resuscitation (DNR) PRE-ARREST INTERVENTIONS DESIRED [id number]  Cleatus Delayne GAILS,  MD ED   02/18/2022 2023 02/19/2022 2055 DNR [id number]  Cleatus Delayne GAILS, MD ED   11/16/2021 0350 11/20/2021 2302 DNR [id number]  Mansy, Madison LABOR, MD Inpatient   11/15/2021 2332 11/16/2021 0350 Full Code [id number]  Mansy, Madison LABOR, MD ED   11/15/2021 2309 11/15/2021 2332 Full Code [id number]  Mansy, Madison LABOR, MD ED   06/26/2021 1351 06/27/2021 1904 Full Code [id number]  Hilma Rankins, MD ED   12/26/2020 1356 12/31/2020 1801 Full Code [id number]  Cox, Amy N, DO ED   03/05/2017 0837 03/10/2017 1659 Full Code [id number]  Stephania Ozell RAMAN Inpatient   03/04/2017 1942 03/05/2017 0837 Full Code [id number]  Delores Raford SAILOR, MD ED   03/03/2017 2157 03/04/2017 1749 Full Code [id number]  Lang Dover, MD ED   11/19/2015 1707 11/23/2015 1517 Full Code [id number]  Wonda Charlie BRAVO, MD Inpatient      Advance Directive Documentation    Flowsheet Row Most Recent Value  Type of Advance Directive Healthcare Power of Attorney, Living will  Pre-existing out of facility DNR order (yellow form or pink MOST form) --  MOST Form in Place? --    Camelia Lewis, NP  Please contact Palliative Medicine Team phone at [phone] for questions and concerns.

## 2024-01-18 NOTE — Progress Notes (Signed)
 Mobility Specialist Progress Note:    01/18/24 1547  Mobility  Activity Stood at bedside;Pivoted/transferred from chair to bed  Level of Assistance Moderate assist, patient does 50-74%  Assistive Device Front wheel walker  RLE Weight Bearing Per Provider Order WBAT  Activity Response Tolerated well  Mobility visit 1 Mobility  Mobility Specialist Start Time (ACUTE ONLY) 1529  Mobility Specialist Stop Time (ACUTE ONLY) 1538  Mobility Specialist Time Calculation (min) (ACUTE ONLY) 9 min   Pt received in chair, requesting assistance with peri care and transfer back to bed d/t BM. Required ModA +2 to stand and transfer with RW. Tolerated well, c/o dizziness. Nurse notified. NT at bedside, all needs met.  Sherrilee Ditty Mobility Specialist Please contact via Special Educational Needs Teacher or  Rehab office at [phone]

## 2024-01-18 NOTE — Progress Notes (Signed)
 Progress Note   Patient: John Booker FMW:[id number] DOB: 1950-12-27 DOA: 01/07/2024     11 DOS: the patient was seen and examined on 01/18/2024   Brief hospital course: 73 y.o. male with Past medical history of HTN, HFpEF, CKD stage II, lymphedema of bilateral lower extremities, esophageal stricture s/p dilatation, chronic hiccups as reviewed from EMR, presented at [facility] ED with complaining of bilateral lower extremity and upper extremity edema which is gradually getting worse.  Patient is not a very good historian, as per patient's daughter lower extremity edema is gradually getting worse for the past 1 week and it is weeping.  There is an area of erythema in the left lower extremity on the lateral side which he scratched maybe had trauma.  Patient reported no fever.  While patient was getting ready to come to the ED, he fell and landed on his buttocks, no LOC.  Denies any pain after fall.   11/3.  Vascular surgery brought to the procedure suite for angiogram.  No intervention was done secondary to the patient needing an endoscopy and needing to be off anticoagulation.  The patient does have peripheral vascular disease.  Extensive lower extremity revascularization at a later date. 11/4.  EGD was done by Dr. Jinny and a benign-appearing esophageal stenosis was dilated. 11/5.  Patient still having trouble eating.  Case discussed with gastroenterology and will get speech pathology to evaluate for potential barium swallow.  May need manometry as outpatient. 11/6.  Dr. Marea did a mechanical thrombectomy of the right SFA, tibioperoneal trunk and posterior tibial artery, stent placement right SFA, angioplasty right posterior tibial artery and tibioperoneal trunk, stent placement right tibioperoneal trunk. 11/7.  Dr. Ashley did an amputation of the interphalangeal joint great toe and amputation of the third toe on the right foot secondary to gangrene. 11/8.  No pain in right foot after amputation.  Podiatry  cleared to get out of the hospital.  Looking into rehab beds. 11/9.  Medically stable to go out to rehab once bed available and insurance authorizes. 11/10.  Patient having some dizziness this afternoon.  Patient orthostatic with physical therapy.  Will hold antihypertensive medications and give a fluid bolus.  Imodium  as needed for diarrhea.  Stop antibiotic as per patient request.  Assessment and Plan: * Orthostatic hypotension IV fluid bolus.  Hold Lasix .  Hold Toprol .  Hold hydralazine .  Diarrhea Likely secondary to antibiotics.  Discontinue antibiotics at patient request.  As needed Imodium .  White count normal and no fever.  No indication for C. difficile testing.  Start probiotic.  Anasarca Initially was started on Lasix  drip.  Was off diuretics for procedures.  Restarted low-dose Lasix  on 11/8.  Hold Lasix  with orthostatic hypotension.  IV fluid bolus.  Patient did not want compression wraps.  PAD (peripheral artery disease) 11/6. Patient had mechanical thrombectomy of the right SFA, tibioperoneal trunk and posterior tibial artery, stent placement to right SFA, angioplasty of right posterior tibial artery and tibioperoneal trunk, stent placement right tibioperoneal trunk.  On aspirin , Plavix and Lipitor.  Patient declined compression wraps.  Cellulitis of left lower extremity Cellulitis of bilateral lower extremities.  Gangrene right third toe and part of the first toe,  Left foot ulcer.  Also on the left foot areas of scabs which could be starting of gangrene.  Patient received vancomycin  and Rocephin  and switched over to Augmentin .  Had toe amputations 11/7 by Dr. Ashley.  Podiatry recommended continuing Augmentin  till the 14th.  Since patient having  diarrhea we will discontinue antibiotics on 11/10.  Wound care: Remove dressing to right foot and to bilateral lower extremities every 3 days. Apply Xeroform gauze to the right foot incisions and 4 x 4 gauze between toes, 4 x 4 gauze,  Kerlix from forefoot to below knee, Ace wrap from forefoot to below knee to bilateral lower extremities.   Foam dressings to bilateral leg wounds change every 3 days or as needed soiling.    Esophageal dysphagia Continue PPI.  EGD with dilation on 11/4.  Appreciate speech pathology consultation.  Continue Protonix .  May end up needing outpatient manometry.  Stricture of esophagus Dilated on 11/4.  Continue PPI.  Hypomagnesemia Treated  AKI (acute kidney injury) Resolved.  Hypokalemia Treated  Hyponatremia Normalized  Protein-calorie malnutrition, moderate Continue supplements        Subjective: Patient felt okay this morning.  Came back to the bedside this afternoon secondary to patient having some dizziness.  Patient also complaining of diarrhea.  Will switch to Imodium .  He wants to stop antibiotics.  Admitted with cellulitis and gangrene.  Physical Exam: Vitals:   01/17/24 1707 01/17/24 2057 01/18/24 0410 01/18/24 0821  BP: (!) 162/67 (!) 152/67 (!) 157/77 (!) 151/78  Pulse: 97 88 85 74  Resp: 16 16 18 16   Temp: 98.6 F (37 C) 98.3 F (36.8 C) 97.8 F (36.6 C) 97.7 F (36.5 C)  TempSrc: Oral   Oral  SpO2: 100% 100% 96% 100%  Weight:      Height:       Physical Exam HENT:     Head: Normocephalic.     Mouth/Throat:     Pharynx: No oropharyngeal exudate.  Eyes:     General: Lids are normal.     Conjunctiva/sclera: Conjunctivae normal.  Cardiovascular:     Rate and Rhythm: Normal rate and regular rhythm.     Heart sounds: S1 normal and S2 normal. Murmur heard.     Systolic murmur is present with a grade of 2/6.  Pulmonary:     Breath sounds: No decreased breath sounds, wheezing, rhonchi or rales.  Abdominal:     Palpations: Abdomen is soft.     Tenderness: There is no abdominal tenderness.  Musculoskeletal:     Right lower leg: Swelling present.     Left lower leg: Swelling present.  Skin:    General: Skin is warm.     Comments: Right foot  wrapped.  Chronic lower extremity skin discoloration.  Neurological:     Mental Status: He is alert.     Data Reviewed: Creatinine 0.91, white blood count 7, hemoglobin 10, platelet count 270  Family Communication: Daughter at bedside in the afternoon  Disposition: Status is: Inpatient Remains inpatient appropriate because: Hopefully out to rehab tomorrow.  Patient orthostatic with physical therapy today likely combination of deconditioning, diarrhea and restarting Lasix .  Will hold Lasix  and antihypertensive medications given fluid bolus today and reevaluate tomorrow.  Planned Discharge Destination: Rehab    Time spent: 28 minutes  Author: Charlie Patterson, MD 01/18/2024 4:38 PM  For on call review [email or web address].

## 2024-01-18 NOTE — Progress Notes (Signed)
 Dressing to midline changed on 01/16/24, due to be changed11/08/25

## 2024-01-19 DIAGNOSIS — R197 Diarrhea, unspecified: Secondary | ICD-10-CM | POA: Diagnosis not present

## 2024-01-19 DIAGNOSIS — I951 Orthostatic hypotension: Secondary | ICD-10-CM | POA: Diagnosis not present

## 2024-01-19 DIAGNOSIS — R601 Generalized edema: Secondary | ICD-10-CM | POA: Diagnosis not present

## 2024-01-19 DIAGNOSIS — I739 Peripheral vascular disease, unspecified: Secondary | ICD-10-CM | POA: Diagnosis not present

## 2024-01-19 LAB — BASIC METABOLIC PANEL WITH GFR
Anion gap: 8 (ref 5–15)
BUN: 21 mg/dL (ref 8–23)
CO2: 28 mmol/L (ref 22–32)
Calcium: 8.6 mg/dL — ABNORMAL LOW (ref 8.9–10.3)
Chloride: 100 mmol/L (ref 98–111)
Creatinine, Ser: 0.8 mg/dL (ref 0.61–1.24)
GFR, Estimated: >60 mL/min (ref >60)
Glucose, Bld: 102 mg/dL — ABNORMAL HIGH (ref 70–99)
Potassium: 4 mmol/L (ref 3.5–5.1)
Sodium: 136 mmol/L (ref 135–145)

## 2024-01-19 LAB — CBC
HCT: 31.7 % — ABNORMAL LOW (ref 39.0–52.0)
Hemoglobin: 10 g/dL — ABNORMAL LOW (ref 13.0–17.0)
MCH: 31.6 pg (ref 26.0–34.0)
MCHC: 31.5 g/dL (ref 30.0–36.0)
MCV: 100.3 fL — ABNORMAL HIGH (ref 80.0–100.0)
Platelets: 293 K/uL (ref 150–400)
RBC: 3.16 MIL/uL — ABNORMAL LOW (ref 4.22–5.81)
RDW: 12.3 % (ref 11.5–15.5)
WBC: 6.5 K/uL (ref 4.0–10.5)
nRBC: 0 % (ref 0.0–0.2)

## 2024-01-19 LAB — SURGICAL PATHOLOGY

## 2024-01-19 MED ORDER — MIDODRINE HCL 5 MG PO TABS
5.0000 mg | ORAL_TABLET | Freq: Three times a day (TID) | ORAL | Status: DC
  Administered 2024-01-19 – 2024-01-20 (×5): 5 mg via ORAL
  Filled 2024-01-19 (×5): qty 1

## 2024-01-19 MED ORDER — LOPERAMIDE HCL 2 MG PO CAPS
4.0000 mg | ORAL_CAPSULE | Freq: Four times a day (QID) | ORAL | Status: DC | PRN
Start: 2024-01-19 — End: 2024-01-21
  Administered 2024-01-19 – 2024-01-20 (×2): 4 mg via ORAL
  Filled 2024-01-19 (×2): qty 2

## 2024-01-19 MED ORDER — LOPERAMIDE HCL 2 MG PO CAPS: 4.0000 mg | ORAL_CAPSULE | Freq: Three times a day (TID) | ORAL | Status: DC | PRN

## 2024-01-19 NOTE — Progress Notes (Addendum)
 Progress Note   Patient: John Booker FMW:[id number] DOB: [birth year] DOA: 01/07/2024     12 DOS: the patient was seen and examined on 01/19/2024   Brief hospital course: 73 y.o. male with Past medical history of HTN, HFpEF, CKD stage II, lymphedema of bilateral lower extremities, esophageal stricture s/p dilatation, chronic hiccups as reviewed from EMR, presented at [facility] ED with complaining of bilateral lower extremity and upper extremity edema which is gradually getting worse.  Patient is not a very good historian, as per patient's daughter lower extremity edema is gradually getting worse for the past 1 week and it is weeping.  There is an area of erythema in the left lower extremity on the lateral side which he scratched maybe had trauma.  Patient reported no fever.  While patient was getting ready to come to the ED, he fell and landed on his buttocks, no LOC.  Denies any pain after fall.   11/3.  Vascular surgery brought to the procedure suite for angiogram.  No intervention was done secondary to the patient needing an endoscopy and needing to be off anticoagulation.  The patient does have peripheral vascular disease.  Extensive lower extremity revascularization at a later date. 11/4.  EGD was done by Dr. Jinny and a benign-appearing esophageal stenosis was dilated. 11/5.  Patient still having trouble eating.  Case discussed with gastroenterology and will get speech pathology to evaluate for potential barium swallow.  May need manometry as outpatient. 11/6.  Dr. Marea did a mechanical thrombectomy of the right SFA, tibioperoneal trunk and posterior tibial artery, stent placement right SFA, angioplasty right posterior tibial artery and tibioperoneal trunk, stent placement right tibioperoneal trunk. 11/7.  Dr. Ashley did an amputation of the interphalangeal joint great toe and amputation of the third toe on the right foot secondary to gangrene. 11/8.  No pain in right foot after amputation.  Podiatry  cleared to get out of the hospital.  Looking into rehab beds. 11/9.  Medically stable to go out to rehab once bed available and insurance authorizes. 11/10.  Patient having some dizziness this afternoon.  Patient orthostatic with physical therapy.  Will hold antihypertensive medications and give a fluid bolus.  Imodium  as needed for diarrhea.  Stop antibiotic as per patient request.  Patient orthostatic with physical therapy yesterday evening.  Antihypertensive medications held.  Fluid bolus given. 11/11.  Still had a lot of diarrhea overnight.  Increased Imodium  to 4 mg every 6 hours as needed.  Assessment and Plan: * Orthostatic hypotension Gentle fluids today.  Started midodrine.  Not orthostatic on last orthostatic check.  Hold Lasix .  Hold Toprol .  Hold hydralazine .  Diarrhea Likely secondary to antibiotics.  Discontinue antibiotics on 11/10 at patient request.  As needed Imodium .  White count normal and no fever.  No indication for C. difficile testing.  Continue probiotic.  Anasarca Initially was started on Lasix  drip.  Was off diuretics for procedures.  Restarted low-dose Lasix  on 11/8.  Hold Lasix  with orthostatic hypotension on 11/10.  Patient did not want compression wraps.  PAD (peripheral artery disease) 11/6. Patient had mechanical thrombectomy of the right SFA, tibioperoneal trunk and posterior tibial artery, stent placement to right SFA, angioplasty of right posterior tibial artery and tibioperoneal trunk, stent placement right tibioperoneal trunk.  On aspirin , Plavix and Lipitor.  Patient declined compression wraps.  Cellulitis of left lower extremity Cellulitis of bilateral lower extremities.  Gangrene right third toe and part of the first toe,  Left foot ulcer.  Also on the left foot areas of scabs which could be starting of gangrene.  Patient received vancomycin  and Rocephin  and switched over to Augmentin .  Had toe amputations 11/7 by Dr. Ashley.  Podiatry recommended  continuing Augmentin  till the 14th.  Since patient having diarrhea we discontinued antibiotics on 11/10.  Wound care: Remove dressing to right foot and to bilateral lower extremities every 3 days. Apply Xeroform gauze to the right foot incisions and 4 x 4 gauze between toes, 4 x 4 gauze, Kerlix from forefoot to below knee, Ace wrap from forefoot to below knee to bilateral lower extremities.   Foam dressings to bilateral leg wounds change every 3 days or as needed soiling.    Esophageal dysphagia Continue PPI.  EGD with dilation on 11/4.  Appreciate speech pathology consultation.  Continue Protonix .  May end up needing outpatient manometry.  Patient declined PEG tube.  Stricture of esophagus Dilated on 11/4.  Continue PPI.  Hypomagnesemia Treated  AKI (acute kidney injury) Resolved.  Hypokalemia Treated  Hyponatremia Normalized  Protein-calorie malnutrition, moderate Continue supplements        Subjective: Patient upset about the diarrhea.  Increase Imodium  to 4 mg every 6 hours as needed.  Patient was orthostatic last night and started on fluid bolus.  Started midodrine this morning.  Physical Exam: Vitals:   01/18/24 2024 01/19/24 0419 01/19/24 0735 01/19/24 1549  BP: 137/65 135/63 (!) 138/59 (!) 142/73  Pulse: 83 79 81 92  Resp: 16 16 14 18   Temp: 98.1 F (36.7 C) 98.2 F (36.8 C) 98.1 F (36.7 C) 98.1 F (36.7 C)  TempSrc: Oral Oral    SpO2: 99% 100% 100% 100%  Weight:      Height:       Physical Exam HENT:     Head: Normocephalic.     Mouth/Throat:     Pharynx: No oropharyngeal exudate.  Eyes:     General: Lids are normal.     Conjunctiva/sclera: Conjunctivae normal.  Cardiovascular:     Rate and Rhythm: Normal rate and regular rhythm.     Heart sounds: S1 normal and S2 normal. Murmur heard.     Systolic murmur is present with a grade of 2/6.  Pulmonary:     Breath sounds: No decreased breath sounds, wheezing, rhonchi or rales.  Abdominal:      Palpations: Abdomen is soft.     Tenderness: There is no abdominal tenderness.  Musculoskeletal:     Right lower leg: Swelling present.     Left lower leg: Swelling present.  Skin:    General: Skin is warm.     Comments: Right foot wrapped.  Chronic lower extremity skin discoloration.  Neurological:     Mental Status: He is alert.     Data Reviewed: Creatinine 0.8, electrolytes normal range, glucose 102, white blood cell count 6.5, hemoglobin 10.0, platelet count 293  Family Communication: Spoke with daughter on the phone  Disposition: Status is: Inpatient Remains inpatient appropriate because: Cancel plans for going out to rehab.  Patient was orthostatic last night and required fluids.  Patient still having a lot of diarrhea.  Started midodrine this morning.  Planned Discharge Destination: Rehab    Time spent: 28 minutes  Author: Charlie Patterson, MD 01/19/2024 4:44 PM  For on call review [email or web address].

## 2024-01-19 NOTE — Plan of Care (Signed)
  Problem: Education: Goal: Knowledge of General Education information will improve Description: Including pain rating scale, medication(s)/side effects and non-pharmacologic comfort measures Outcome: Progressing   Problem: Health Behavior/Discharge Planning: Goal: Ability to manage health-related needs will improve Outcome: Progressing   Problem: Clinical Measurements: Goal: Ability to maintain clinical measurements within normal limits will improve Outcome: Progressing Goal: Will remain free from infection Outcome: Progressing Goal: Diagnostic test results will improve Outcome: Progressing Goal: Respiratory complications will improve Outcome: Progressing Goal: Cardiovascular complication will be avoided Outcome: Progressing   Problem: Activity: Goal: Risk for activity intolerance will decrease Outcome: Progressing   Problem: Nutrition: Goal: Adequate nutrition will be maintained Outcome: Progressing   Problem: Coping: Goal: Level of anxiety will decrease Outcome: Progressing   Problem: Elimination: Goal: Will not experience complications related to bowel motility Outcome: Progressing Goal: Will not experience complications related to urinary retention Outcome: Progressing   Problem: Pain Managment: Goal: General experience of comfort will improve and/or be controlled Outcome: Progressing   Problem: Safety: Goal: Ability to remain free from injury will improve Outcome: Progressing   Problem: Skin Integrity: Goal: Risk for impaired skin integrity will decrease Outcome: Progressing   Problem: Education: Goal: Ability to demonstrate management of disease process will improve Outcome: Progressing Goal: Ability to verbalize understanding of medication therapies will improve Outcome: Progressing Goal: Individualized Educational Video(s) Outcome: Progressing   Problem: Activity: Goal: Capacity to carry out activities will improve Outcome: Progressing    Problem: Cardiac: Goal: Ability to achieve and maintain adequate cardiopulmonary perfusion will improve Outcome: Progressing   Problem: Education: Goal: Knowledge of disease and its progression will improve Outcome: Progressing Goal: Individualized Educational Video(s) Outcome: Progressing   Problem: Fluid Volume: Goal: Compliance with measures to maintain balanced fluid volume will improve Outcome: Progressing   Problem: Health Behavior/Discharge Planning: Goal: Ability to manage health-related needs will improve Outcome: Progressing   Problem: Nutritional: Goal: Ability to make healthy dietary choices will improve Outcome: Progressing   Problem: Clinical Measurements: Goal: Complications related to the disease process, condition or treatment will be avoided or minimized Outcome: Progressing

## 2024-01-19 NOTE — Progress Notes (Signed)
 Mobility Specialist - Progress Note   01/19/24 1141  Orthostatic Lying   BP- Lying 139/68  Pulse- Lying 89  Orthostatic Sitting  BP- Sitting 117/65  Pulse- Sitting 92  Orthostatic Standing at 0 minutes  BP- Standing at 0 minutes 132/61  Pulse- Standing at 0 minutes 97  Orthostatic Standing at 3 minutes  BP- Standing at 3 minutes 115/63  Pulse- Standing at 3 minutes 97  Mobility  Activity Stood at bedside  Level of Assistance Minimal assist, patient does 75% or more  Assistive Device Front wheel walker  RLE Weight Bearing Per Provider Order WBAT  Activity Response Tolerated well  Mobility visit 1 Mobility  Mobility Specialist Start Time (ACUTE ONLY) 1113  Mobility Specialist Stop Time (ACUTE ONLY) 1128  Mobility Specialist Time Calculation (min) (ACUTE ONLY) 15 min   Pt supine upon entry, utilizing RA. Pt STS to RW MinA (+2 for safety), stood at bedside for ~ 3 mins while MS completed orthostatic vitals (listed above). Pt returned to bed, left supine with alarm set and needs within reach.  America Silvan Mobility Specialist 01/19/24 11:49 AM

## 2024-01-19 NOTE — TOC Progression Note (Signed)
 Transition of Care [facility]) - Progression Note    Patient Details  Name: John Booker MRN: [id number] Date of Birth: 1950/06/30  [facility] CM/SW Contact  Corean ONEIDA Haddock, RN Phone Number: 01/19/2024, 9:24 AM  Clinical Narrative:     Per MD not medically ready to dc today due to diarrhea and orthostatic. Auth valid through 11/12.  Darrian at [employer] Notified    Expected Discharge Plan: Home w Home Health Services Barriers to Discharge: Continued Medical Work up               Expected Discharge Plan and Services     Post Acute Care Choice: Home Health Living arrangements for the past 2 months: Single Family Home Expected Discharge Date: 01/17/24                                     Social Drivers of Health (SDOH) Interventions SDOH Screenings   Food Insecurity: No Food Insecurity (01/07/2024)  Housing: Low Risk  (01/07/2024)  Transportation Needs: No Transportation Needs (01/07/2024)  Utilities: Not At Risk (01/07/2024)  Depression (PHQ2-9): Low Risk  (12/12/2021)  Financial Resource Strain: Low Risk  (05/07/2023)   Received from [facility]  Social Connections: Socially Isolated (01/07/2024)  Tobacco Use: Medium Risk (01/14/2024)    Readmission Risk Interventions    07/13/2023   12:44 PM  Readmission Risk Prevention Plan  Transportation Screening Complete  PCP or Specialist Appt within 5-7 Days Complete  Medication Review (RN CM) Complete

## 2024-01-19 NOTE — Progress Notes (Signed)
 Occupational Therapy Treatment Patient Details Name: John Booker MRN: [id number] DOB: Nov 05, 1950 Today's Date: 01/19/2024   History of present illness Pt is a 73 y/o M admitted on 01/07/24 following a fall, c/o worsening BLE & BUE edema. Pt is being treated for anasarca, BLE cellulitis, ischemic R 1st toe ulcer & ischemia of 3rd toe. PMH: HTN, HFpEF, CKD 2, lymphedema of BLE, chronic hiccups, EtOH abuse.  Hospital course significant for R LE aortogram/angiogram (11/3), RR LE thrombectomy (11/6), R great toe, 3rd toe amputation and 5th MTPJ debridement (11/7).  Cleared per podiatry for [facility] short-distance with post-op shoe to R LE.   OT comments  Patient seen for OT/PT cotreatment on this date. Upon arrival to room patient in bed, requested OT/PT return so he can have lunch, lunch not in room yet- but reluctant but agreeable to work with therapy until lunch arrived. Patient required coaxing to answer questions, was irritable and agitated throughout treatment session. Educated on need for post op shoe to RLE, required A to don. Patient unable to don L sock wtihout A. Transitioned to EOB with min A. Patient performed sit<>stand with min A and cues for hand placement and r/w management; noted small bowel incontinence/smearing, required A to perform hygiene and bed linen changed. Patient agreeable to walk around bed, performed with CGA and r/w with post op shoe on. PT facilitated weight shifting while in stance. Patient demanded to end tx once lunch arrived. Patient ended treatment in bed with bed/chair alarm on and all needs within reach. Patient making fair/limited progress toward goals; primarily limited by agitation during therapy, will continue to follow POC. Discharge recommendation remains appropriate.        If plan is discharge home, recommend the following:  Two people to help with walking and/or transfers;Two people to help with bathing/dressing/bathroom;Supervision due to cognitive  status;Direct supervision/assist for medications management;Help with stairs or ramp for entrance;Assist for transportation;Assistance with cooking/housework   Equipment Recommendations  Other (comment)    Recommendations for Other Services      Precautions / Restrictions Precautions Precautions: Fall Recall of Precautions/Restrictions: Intact Restrictions Weight Bearing Restrictions Per Provider Order: Yes RLE Weight Bearing Per Provider Order: Weight bearing as tolerated Other Position/Activity Restrictions: WBAT + post-op shoe       Mobility Bed Mobility Overal bed mobility: Needs Assistance Bed Mobility: Supine to Sit     Supine to sit: Min assist          Transfers Overall transfer level: Needs assistance Equipment used: Rolling walker (2 wheels) Transfers: Sit to/from Stand Sit to Stand: Min assist           General transfer comment: cues for hand placement and body mechanics     Balance Overall balance assessment: Needs assistance Sitting-balance support: No upper extremity supported, Feet supported Sitting balance-Leahy Scale: Good     Standing balance support: During functional activity, Single extremity supported, Reliant on assistive device for balance Standing balance-Leahy Scale: Fair                             ADL either performed or assessed with clinical judgement   ADL Overall ADL's : Needs assistance/impaired             Lower Body Bathing: Moderate assistance Lower Body Bathing Details (indicate cue type and reason): A for pericare while in stance     Lower Body Dressing: Sit to/from stand;Maximal assistance Lower Body Dressing Details (  indicate cue type and reason): MAX A for donning L sock and R post-op shoe, anticiapte MAX A for LB dressing involving STS transfers with RW               General ADL Comments: patient requires coaxing to participate    Extremity/Trunk Assessment              Vision        Perception     Praxis     Communication Communication Communication: Impaired Factors Affecting Communication: Hearing impaired   Cognition Arousal: Alert Behavior During Therapy: Agitated Cognition: No family/caregiver present to determine baseline                               Following commands: Impaired        Cueing   Cueing Techniques: Verbal cues, Gestural cues, Tactile cues, Visual cues  Exercises      Shoulder Instructions       General Comments      Pertinent Vitals/ Pain       Pain Assessment Pain Assessment: No/denies pain  Home Living                                          Prior Functioning/Environment              Frequency  Min 2X/week        Progress Toward Goals  OT Goals(current goals can now be found in the care plan section)  Progress towards OT goals: Progressing toward goals  Acute Rehab OT Goals Patient Stated Goal: to be left alone OT Goal Formulation: With patient Time For Goal Achievement: 01/30/24 Potential to Achieve Goals: Fair ADL Goals Pt Will Perform Grooming: with modified independence;sitting Pt Will Perform Lower Body Dressing: with mod assist;sitting/lateral leans Pt Will Transfer to Toilet: with contact guard assist;ambulating Pt Will Perform Toileting - Clothing Manipulation and hygiene: sit to/from stand;with min assist  Plan      Co-evaluation      Reason for Co-Treatment: For patient/therapist safety;To address functional/ADL transfers PT goals addressed during session: Mobility/safety with mobility;Balance;Proper use of DME OT goals addressed during session: ADL's and self-care;Proper use of Adaptive equipment and DME      AM-PAC OT 6 Clicks Daily Activity     Outcome Measure   Help from another person eating meals?: None Help from another person taking care of personal grooming?: A Little Help from another person toileting, which includes using toliet,  bedpan, or urinal?: A Lot Help from another person bathing (including washing, rinsing, drying)?: A Lot Help from another person to put on and taking off regular upper body clothing?: A Little Help from another person to put on and taking off regular lower body clothing?: A Lot 6 Click Score: 16    End of Session Equipment Utilized During Treatment: Gait belt;Rolling walker (2 wheels)  OT Visit Diagnosis: Muscle weakness (generalized) (M62.81);Other abnormalities of gait and mobility (R26.89)   Activity Tolerance Treatment limited secondary to agitation;Other (comment) (wanting to end tx and have lunch)   Patient Left in bed;with call bell/phone within reach;with bed alarm set   Nurse Communication          Time: 8688-8670 OT Time Calculation (min): 18 min  Charges: OT General Charges $OT Visit: 1 Visit OT Treatments $Self  Care/Home Management : 8-22 mins  Rogers Clause, OT/L MSOT, 01/19/2024

## 2024-01-19 NOTE — Progress Notes (Signed)
 Daily Progress Note   Patient Name: John Booker       Date: 01/19/2024 DOB: 1950/08/24  Age: 73 y.o. MRN#: [id number] Attending Physician: Josette Ade, MD Primary Care Physician: Alla Amis, MD Admit Date: 01/07/2024  Reason for Consultation/Follow-up: Establishing goals of care  Subjective: Notes and labs reviewed.  In to see patient.  He discusses being very weak and hardly able to walk from the bed to the door.  He discusses continued frustration with difficulty swallowing and with reflux.   Length of Stay: 12  Current Medications: Scheduled Meds:   (feeding supplement) PROSource Plus  30 mL Oral BID BM   acidophilus  2 capsule Oral Daily   vitamin C   500 mg Oral BID   aspirin  EC  81 mg Oral Daily   atorvastatin  10 mg Oral Daily   clopidogrel  75 mg Oral Daily   feeding supplement  237 mL Oral TID BM   folic acid   1 mg Oral Daily   iron polysaccharides  150 mg Oral Daily   midodrine  5 mg Oral TID WC   multivitamin with minerals  1 tablet Oral Daily   pantoprazole   40 mg Oral BID   sodium chloride  flush  10-40 mL Intracatheter Q12H   thiamine   100 mg Oral Daily   Or   thiamine   100 mg Intravenous Daily   Vitamin D  (Ergocalciferol )  50,000 Units Oral Q7 days   zinc  sulfate (50mg  elemental zinc )  220 mg Oral Daily    Continuous Infusions:  sodium chloride  50 mL/hr at 01/18/24 2118    PRN Meds: acetaminophen  **OR** acetaminophen , alum & mag hydroxide-simeth, diphenhydrAMINE, HYDROcodone -acetaminophen , liver oil-zinc  oxide, loperamide , ondansetron  **OR** ondansetron  (ZOFRAN ) IV, mouth rinse, sodium chloride  flush  Physical Exam Pulmonary:     Effort: Pulmonary effort is normal.  Neurological:     Mental Status: He is alert.             Vital Signs:  BP (!) 138/59 (BP Location: Right Arm)   Pulse 81   Temp 98.1 F (36.7 C)   Resp 14   Ht 6' 3 (1.905 m)   Wt 74.9 kg   SpO2 100%   BMI 20.64 kg/m  SpO2: SpO2: 100 % O2 Device: O2 Device: Room Air O2 Flow Rate: O2 Flow Rate (  L/min): 2 L/min  Intake/output summary:  Intake/Output Summary (Last 24 hours) at 01/19/2024 1530 Last data filed at 01/19/2024 0422 Gross per 24 hour  Intake 485 ml  Output 250 ml  Net 235 ml   LBM: Last BM Date : 01/19/24 Baseline Weight: Weight: 83.9 kg Most recent weight: Weight: 74.9 kg   Patient Active Problem List   Diagnosis Date Noted   Orthostatic hypotension 01/18/2024   Diarrhea 01/18/2024   PAD (peripheral artery disease) 01/14/2024   Gangrene (HCC) 01/13/2024   Stricture of esophagus 01/12/2024   Esophageal dysphagia 01/11/2024   Cellulitis of left lower extremity 01/08/2024   Anasarca 01/07/2024   Acquired achalasia of esophagus - s/p esophageal dilatation 07-14-2023 07/15/2023   DNR (do not resuscitate)/DNI(Do NOT Intubate) 07/15/2023   Hypophosphatemia 07/12/2023   Hypomagnesemia 07/12/2023   Weakness 12/13/2022   Serum total bilirubin elevated 12/13/2022   Benign paraspinal mass 11/20/2021   GERD without esophagitis 11/16/2021   Hypokalemia 11/16/2021   Hyponatremia 11/16/2021   AKI (acute kidney injury) 11/15/2021   Stage 3a chronic kidney disease (HCC) 11/07/2021   Protrusion of cervical intervertebral disc 07/23/2021   Exertional dyspnea 07/04/2021   History of alcohol abuse 07/04/2021   History of tobacco abuse 07/04/2021   Chronic diastolic CHF (congestive heart failure) (HCC) 06/26/2021   Headache 06/26/2021   Low serum albumin 05/10/2021   Protein-calorie malnutrition, moderate 12/28/2020   Esophageal dysmotility 12/28/2020   Generalized weakness 12/26/2020   Overweight (BMI 25.0-29.9) 12/20/2019   Neuropathy 04/04/2019   Pre-ulcerative calluses 10/07/2018   Skin lesion 10/07/2018   Plantar flexed metatarsal  bone of right foot 10/07/2018   Hiatal hernia    Alcohol abuse 03/06/2017   Alcohol dependence in sustained full remission (HCC) 03/05/2017   Essential hypertension 11/15/2015   Vitamin D  deficiency 11/15/2015    Palliative Care Assessment & Plan   Recommendations/Plan: Continue care  Code Status:    Code Status Orders  (From admission, onward)           Start     Ordered   01/07/24 1332  Do not attempt resuscitation (DNR)- Limited -Do Not Intubate (DNI)  Continuous       Question Answer Comment  If pulseless and not breathing No CPR or chest compressions.   In Pre-Arrest Conditions (Patient Is Breathing and Has A Pulse) Do not intubate. Provide all appropriate non-invasive medical interventions. Avoid ICU transfer unless indicated or required.   Consent: Discussion documented in EHR or advanced directives reviewed      01/07/24 1333           Code Status History     Date Active Date Inactive Code Status Order ID Comments User Context   07/10/2023 1844 07/15/2023 2139 Limited: Do not attempt resuscitation (DNR) -DNR-LIMITED -Do Not Intubate/DNI  [id number]  Cox, Amy N, DO ED   07/10/2023 1707 07/10/2023 1844 Full Code [id number]  Cox, Amy N, DO ED   12/13/2022 1740 12/14/2022 1947 Full Code [id number]  Cox, Amy N, DO ED   11/23/2022 0355 11/26/2022 2110 Do not attempt resuscitation (DNR) PRE-ARREST INTERVENTIONS DESIRED [id number]  Cleatus Delayne GAILS, MD ED   02/18/2022 2023 02/19/2022 2055 DNR [id number]  Cleatus Delayne GAILS, MD ED   11/16/2021 0350 11/20/2021 2302 DNR [id number]  Mansy, Madison LABOR, MD Inpatient   11/15/2021 2332 11/16/2021 0350 Full Code [id number]  Mansy, Madison LABOR, MD ED   11/15/2021 2309 11/15/2021 2332 Full Code [id number]  Mansy, Madison LABOR, MD ED  06/26/2021 1351 06/27/2021 1904 Full Code [id number]  Hilma Rankins, MD ED   12/26/2020 1356 12/31/2020 1801 Full Code [id number]  Cox, Amy N, DO ED   03/05/2017 0837 03/10/2017 1659 Full Code [id number]  Stephania Ozell RAMAN Inpatient   03/04/2017 1942  03/05/2017 0837 Full Code [id number]  Delores Raford SAILOR, MD ED   03/03/2017 2157 03/04/2017 1749 Full Code [id number]  Lang Dover, MD ED   11/19/2015 1707 11/23/2015 1517 Full Code [id number]  Wonda Charlie BRAVO, MD Inpatient      Advance Directive Documentation    Flowsheet Row Most Recent Value  Type of Advance Directive Healthcare Power of Attorney, Living will  Pre-existing out of facility DNR order (yellow form or pink MOST form) --  MOST Form in Place? --   Camelia Lewis, NP  Please contact Palliative Medicine Team phone at [phone] for questions and concerns.

## 2024-01-19 NOTE — Progress Notes (Signed)
 Physical Therapy Treatment Patient Details Name: John Booker MRN: [id number] DOB: 10-31-1950 Today's Date: 01/19/2024   History of Present Illness Pt is a 73 y/o M admitted on 01/07/24 following a fall, c/o worsening BLE & BUE edema. Pt is being treated for anasarca, BLE cellulitis, ischemic R 1st toe ulcer & ischemia of 3rd toe. PMH: HTN, HFpEF, CKD 2, lymphedema of BLE, chronic hiccups, EtOH abuse.  Hospital course significant for R LE aortogram/angiogram (11/3), RR LE thrombectomy (11/6), R great toe, 3rd toe amputation and 5th MTPJ debridement (11/7).  Cleared per podiatry for [facility] short-distance with post-op shoe to R LE.    PT Comments  Pt agreeable to therapy treatment with encouragement. Co tx with OT for safety. No PT charge. R post-op shoe donned with max A prior to OOB activity. Pt demonstrates improved ability to amb 83ft using RW with CGA. Pt continues to require min A for STS transfers. Pt declines further amb/activity d/t his food tray arriving. No dizziness or pain reported during session. Pt continues to require physical assistance when performing functional tasks and pt lives alone. Without assistance he may be at risk of falls. Will continue to see pt acutely to promote safe discharge and increased independence. Discharge disposition remains appropriate.    If plan is discharge home, recommend the following: A little help with walking and/or transfers;A lot of help with bathing/dressing/bathroom;Assistance with cooking/housework;Assist for transportation;Help with stairs or ramp for entrance   Can travel by private vehicle     Yes  Equipment Recommendations  Rolling walker (2 wheels);BSC/3in1    Recommendations for Other Services       Precautions / Restrictions Precautions Precautions: Fall Recall of Precautions/Restrictions: Intact Restrictions Weight Bearing Restrictions Per Provider Order: Yes RLE Weight Bearing Per Provider Order: Weight bearing as  tolerated Other Position/Activity Restrictions: WBAT + post-op shoe     Mobility  Bed Mobility Overal bed mobility: Needs Assistance Bed Mobility: Supine to Sit     Supine to sit: Min assist Sit to supine: Supervision   General bed mobility comments: SBA for sit>supine. Pt grimacing when bringing RLE onto bed reporting that it feels incomfortable to lift.    Transfers Overall transfer level: Needs assistance Equipment used: Rolling walker (2 wheels) Transfers: Sit to/from Stand Sit to Stand: Min assist           General transfer comment: Min A to stand from elevated bed height. Cues for hand placement and technique.    Ambulation/Gait Ambulation/Gait assistance: Contact guard assist Gait Distance (Feet): 12 Feet Assistive device: Rolling walker (2 wheels) Gait Pattern/deviations: Decreased step length - right, Decreased step length - left, Decreased stride length, Decreased dorsiflexion - right, Decreased dorsiflexion - left, Shuffle Gait velocity: decreased     General Gait Details: reliant on RW to maintain balance. Pt maintained good control of RW and appropriate use.   Stairs             Wheelchair Mobility     Tilt Bed    Modified Rankin (Stroke Patients Only)       Balance Overall balance assessment: Needs assistance Sitting-balance support: No upper extremity supported, Feet supported Sitting balance-Leahy Scale: Good     Standing balance support: During functional activity, Reliant on assistive device for balance, Bilateral upper extremity supported Standing balance-Leahy Scale: Fair Standing balance comment: Reliant on RW for balance. CGA once up.  Communication Communication Communication: Impaired Factors Affecting Communication: Hearing impaired  Cognition Arousal: Alert Behavior During Therapy: Agitated   PT - Cognitive impairments: Memory, Problem solving, Safety/Judgement                          Following commands: Impaired Following commands impaired: Follows one step commands with increased time, Follows multi-step commands with increased time    Cueing Cueing Techniques: Verbal cues, Gestural cues, Tactile cues, Visual cues  Exercises      General Comments General comments (skin integrity, edema, etc.): max A to don post op shoe prior to amb.      Pertinent Vitals/Pain Pain Assessment Pain Assessment: No/denies pain    Home Living                          Prior Function            PT Goals (current goals can now be found in the care plan section) Acute Rehab PT Goals PT Goal Formulation: With patient Time For Goal Achievement: 01/30/24 Potential to Achieve Goals: Fair Progress towards PT goals: Progressing toward goals    Frequency    Min 2X/week      PT Plan      Co-evaluation PT/OT/SLP Co-Evaluation/Treatment: Yes Reason for Co-Treatment: For patient/therapist safety;To address functional/ADL transfers PT goals addressed during session: Mobility/safety with mobility;Balance;Proper use of DME OT goals addressed during session: ADL's and self-care;Proper use of Adaptive equipment and DME      AM-PAC PT 6 Clicks Mobility   Outcome Measure  Help needed turning from your back to your side while in a flat bed without using bedrails?: None Help needed moving from lying on your back to sitting on the side of a flat bed without using bedrails?: A Little Help needed moving to and from a bed to a chair (including a wheelchair)?: A Little Help needed standing up from a chair using your arms (e.g., wheelchair or bedside chair)?: A Little Help needed to walk in hospital room?: A Little Help needed climbing 3-5 steps with a railing? : A Lot 6 Click Score: 18    End of Session Equipment Utilized During Treatment: Gait belt Activity Tolerance: Patient tolerated treatment well Patient left: in bed;with call bell/phone within  reach;with bed alarm set Nurse Communication: Mobility status PT Visit Diagnosis: Muscle weakness (generalized) (M62.81);Pain;Difficulty in walking, not elsewhere classified (R26.2);Other abnormalities of gait and mobility (R26.89) Pain - Right/Left: Right Pain - part of body: Leg     Time: 8688-8670 PT Time Calculation (min) (ACUTE ONLY): 18 min  Charges:                            Steaven Wholey, SPT    Suly Vukelich 01/19/2024, 2:34 PM

## 2024-01-20 DIAGNOSIS — I951 Orthostatic hypotension: Secondary | ICD-10-CM | POA: Diagnosis not present

## 2024-01-20 LAB — CBC
HCT: 29.1 % — ABNORMAL LOW (ref 39.0–52.0)
Hemoglobin: 9.3 g/dL — ABNORMAL LOW (ref 13.0–17.0)
MCH: 31.8 pg (ref 26.0–34.0)
MCHC: 32 g/dL (ref 30.0–36.0)
MCV: 99.7 fL (ref 80.0–100.0)
Platelets: 299 K/uL (ref 150–400)
RBC: 2.92 MIL/uL — ABNORMAL LOW (ref 4.22–5.81)
RDW: 12.3 % (ref 11.5–15.5)
WBC: 6.5 K/uL (ref 4.0–10.5)
nRBC: 0 % (ref 0.0–0.2)

## 2024-01-20 LAB — BASIC METABOLIC PANEL WITH GFR
Anion gap: 8 (ref 5–15)
BUN: 17 mg/dL (ref 8–23)
CO2: 25 mmol/L (ref 22–32)
Calcium: 8.7 mg/dL — ABNORMAL LOW (ref 8.9–10.3)
Chloride: 105 mmol/L (ref 98–111)
Creatinine, Ser: 0.66 mg/dL (ref 0.61–1.24)
GFR, Estimated: >60 mL/min (ref >60)
Glucose, Bld: 84 mg/dL (ref 70–99)
Potassium: 4.4 mmol/L (ref 3.5–5.1)
Sodium: 138 mmol/L (ref 135–145)

## 2024-01-20 NOTE — Plan of Care (Signed)
 PMT shadowing.  Updates discussed with new attending.  Plans in place for patient to discharge to facility tomorrow.  PMT will continue to shadow.

## 2024-01-20 NOTE — TOC Progression Note (Signed)
 Transition of Care [facility]) - Progression Note    Patient Details  Name: John Booker MRN: [id number] Date of Birth: [birth year]  [facility]) CM/SW Contact  K'La JINNY Ruts, LCSW Phone Number: 01/20/2024, 1:45 PM  Clinical Narrative:    Chart reviewed. The patient will D/C tomorrow. Darrian from [facility] confirmed that the patient still has a bed for tomorrow.    Expected Discharge Plan: Home w Home Health Services Barriers to Discharge: Continued Medical Work up               Expected Discharge Plan and Services     Post Acute Care Choice: Home Health Living arrangements for the past 2 months: Single Family Home Expected Discharge Date: 01/17/24                                     Social Drivers of Health (SDOH) Interventions SDOH Screenings   Food Insecurity: No Food Insecurity (01/07/2024)  Housing: Low Risk  (01/07/2024)  Transportation Needs: No Transportation Needs (01/07/2024)  Utilities: Not At Risk (01/07/2024)  Depression (PHQ2-9): Low Risk  (12/12/2021)  Financial Resource Strain: Low Risk  (05/07/2023)   Received from [facility]  Social Connections: Socially Isolated (01/07/2024)  Tobacco Use: Medium Risk (01/14/2024)    Readmission Risk Interventions    07/13/2023   12:44 PM  Readmission Risk Prevention Plan  Transportation Screening Complete  PCP or Specialist Appt within 5-7 Days Complete  Medication Review (RN CM) Complete

## 2024-01-20 NOTE — TOC Progression Note (Signed)
 Transition of Care [facility]) - Progression Note    Patient Details  Name: John Booker MRN: [id number] Date of Birth: Oct 27, 1950  [facility]) CM/SW Contact  Corean ONEIDA Haddock, RN Phone Number: 01/20/2024, 9:55 AM  Clinical Narrative:     Message sent to MD to determine if patient medically appropriate for dc to [facility]. MD to update CM after rounds.  MD notified that patient will require a signed DNR  Expected Discharge Plan: Home w Home Health Services Barriers to Discharge: Continued Medical Work up               Expected Discharge Plan and Services     Post Acute Care Choice: Home Health Living arrangements for the past 2 months: Single Family Home Expected Discharge Date: 01/17/24                                     Social Drivers of Health (SDOH) Interventions SDOH Screenings   Food Insecurity: No Food Insecurity (01/07/2024)  Housing: Low Risk  (01/07/2024)  Transportation Needs: No Transportation Needs (01/07/2024)  Utilities: Not At Risk (01/07/2024)  Depression (PHQ2-9): Low Risk  (12/12/2021)  Financial Resource Strain: Low Risk  (05/07/2023)   Received from [facility]  Social Connections: Socially Isolated (01/07/2024)  Tobacco Use: Medium Risk (01/14/2024)    Readmission Risk Interventions    07/13/2023   12:44 PM  Readmission Risk Prevention Plan  Transportation Screening Complete  PCP or Specialist Appt within 5-7 Days Complete  Medication Review (RN CM) Complete

## 2024-01-20 NOTE — Progress Notes (Signed)
 Progress Note   Patient: John Booker FMW:[id number] DOB: Mar 11, 1950 DOA: 01/07/2024     13 DOS: the patient was seen and examined on 01/20/2024   Brief hospital course: 73 y.o. male with Past medical history of HTN, HFpEF, CKD stage II, lymphedema of bilateral lower extremities, esophageal stricture s/p dilatation, chronic hiccups as reviewed from EMR, presented at [facility] ED with complaining of bilateral lower extremity and upper extremity edema which is gradually getting worse.  Patient is not a very good historian, as per patient's daughter lower extremity edema is gradually getting worse for the past 1 week and it is weeping.  There is an area of erythema in the left lower extremity on the lateral side which he scratched maybe had trauma.  Patient reported no fever.  While patient was getting ready to come to the ED, he fell and landed on his buttocks, no LOC.  Denies any pain after fall.    11/3.  Vascular surgery brought to the procedure suite for angiogram.  No intervention was done secondary to the patient needing an endoscopy and needing to be off anticoagulation.  The patient does have peripheral vascular disease.  Extensive lower extremity revascularization at a later date. 11/4.  EGD was done by Dr. Jinny and a benign-appearing esophageal stenosis was dilated. 11/5.  Patient still having trouble eating.  Case discussed with gastroenterology and will get speech pathology to evaluate for potential barium swallow.  May need manometry as outpatient. 11/6.  Dr. Marea did a mechanical thrombectomy of the right SFA, tibioperoneal trunk and posterior tibial artery, stent placement right SFA, angioplasty right posterior tibial artery and tibioperoneal trunk, stent placement right tibioperoneal trunk. 11/7.  Dr. Ashley did an amputation of the interphalangeal joint great toe and amputation of the third toe on the right foot secondary to gangrene. 11/8.  No pain in right foot after amputation.  Podiatry  cleared to get out of the hospital.  Looking into rehab beds. 11/9.  Medically stable to go out to rehab once bed available and insurance authorizes. 11/10.  Patient having some dizziness this afternoon.  Patient orthostatic with physical therapy.  Will hold antihypertensive medications and give a fluid bolus.  Imodium  as needed for diarrhea.  Stop antibiotic as per patient request.  Patient orthostatic with physical therapy yesterday evening.  Antihypertensive medications held.  Fluid bolus given. 11/11.  Still had a lot of diarrhea overnight.  Increased Imodium  to 4 mg every 6 hours as needed 11/12: Diarrhea improving, had 2 episodes today.  Will discontinue IV fluids.  Repeat orthostatics pending   Assessment and Plan: Orthostatic hypotension Discontinue IV fluids. On midodrine.  Not orthostatic on last orthostatic check.  Hold Lasix .  Hold Toprol .  Hold hydralazine    Diarrhea - improving Likely secondary to antibiotics.  Discontinue antibiotics on 11/10 at patient request.  As needed Imodium .  White count normal and no fever.  No indication for C. difficile testing.  Continue probiotic.   Anasarca Initially was started on Lasix  drip.  Was off diuretics for procedures.  Restarted low-dose Lasix  on 11/8.  Hold Lasix  with orthostatic hypotension on 11/10.  Patient did not want compression wraps   PAD (peripheral artery disease) 11/6. Patient had mechanical thrombectomy of the right SFA, tibioperoneal trunk and posterior tibial artery, stent placement to right SFA, angioplasty of right posterior tibial artery and tibioperoneal trunk, stent placement right tibioperoneal trunk.  On aspirin , Plavix and Lipitor.  Patient declined compression wraps.   Cellulitis of left  lower extremity Cellulitis of bilateral lower extremities.  Gangrene right third toe and part of the first toe,  Left foot ulcer.  Also on the left foot areas of scabs which could be starting of gangrene.  Patient received vancomycin   and Rocephin  and switched over to Augmentin .  Had toe amputations 11/7 by Dr. Ashley.  Podiatry recommended continuing Augmentin  till the 14th.  Since patient having diarrhea we discontinued antibiotics on 11/10.   Wound care: Remove dressing to right foot and to bilateral lower extremities every 3 days. Apply Xeroform gauze to the right foot incisions and 4 x 4 gauze between toes, 4 x 4 gauze, Kerlix from forefoot to below knee, Ace wrap from forefoot to below knee to bilateral lower extremities.    Foam dressings to bilateral leg wounds change every 3 days or as needed soiling.       Esophageal dysphagia Continue PPI.  EGD with dilation on 11/4.  Appreciate speech pathology consultation.  Continue Protonix .  May end up needing outpatient manometry.  Patient declined PEG tube.   Stricture of esophagus Dilated on 11/4.  Continue PPI.   Hypomagnesemia Treated   AKI (acute kidney injury) Resolved.   Hypokalemia Treated   Hyponatremia Normalized   Protein-calorie malnutrition, moderate Continue supplements  --Palliative outpatient referral with [facility] at discharge    Subjective: Patient was examined at bedside, new to me today.  Daughter present at the bedside. States he relatively feels better today, diarrhea improving.  Taking p.o. intake well Discontinue IV fluids, check orthostatic with  Physical Exam: Vitals:   01/19/24 2100 01/20/24 0413 01/20/24 0416 01/20/24 0420  BP: 129/66 128/70 120/66 (!) 107/59  Pulse: 86 86 90 (!) 101  Resp: 18 17 17    Temp: 98.1 F (36.7 C) 98.1 F (36.7 C)    TempSrc: Oral Oral    SpO2: 100% 98% 98% 99%  Weight:      Height:       Physical Exam HENT:     Head: Normocephalic.     Mouth/Throat:     Pharynx: No oropharyngeal exudate.  Eyes:     General: Lids are normal.     Conjunctiva/sclera: Conjunctivae normal.  Cardiovascular:     Rate and Rhythm: Normal rate and regular rhythm.     Heart sounds: S1 normal and S2  normal. Murmur heard.     Systolic murmur is present with a grade of 2/6.  Pulmonary:     Breath sounds: No decreased breath sounds, wheezing, rhonchi or rales.  Abdominal:     Palpations: Abdomen is soft.     Tenderness: There is no abdominal tenderness.  Musculoskeletal:     Right lower leg: Swelling present.     Left lower leg: Swelling present.  Skin:    General: Skin is warm.     Comments: Right foot wrapped.  Chronic lower extremity skin discoloration.  Neurological:     Mental Status: He is alert.     Data Reviewed: Labs reviewed  Family Communication: Spoke with daughter at the bedside  Disposition: Status is: Inpatient Remains inpatient appropriate because: Cancel plans for going out to rehab.  Discontinue IV fluids, anticipate discharge tomorrow if stable  Planned Discharge Destination: Rehab    Time spent: 35 minutes  Author: Laree Lock, MD 01/20/2024 8:44 AM  For on call review [email or web address].

## 2024-01-21 DIAGNOSIS — I951 Orthostatic hypotension: Secondary | ICD-10-CM | POA: Diagnosis not present

## 2024-01-21 LAB — BASIC METABOLIC PANEL WITH GFR
Anion gap: 5 (ref 5–15)
BUN: 15 mg/dL (ref 8–23)
CO2: 28 mmol/L (ref 22–32)
Calcium: 8.7 mg/dL — ABNORMAL LOW (ref 8.9–10.3)
Chloride: 104 mmol/L (ref 98–111)
Creatinine, Ser: 0.64 mg/dL (ref 0.61–1.24)
GFR, Estimated: >60 mL/min (ref >60)
Glucose, Bld: 89 mg/dL (ref 70–99)
Potassium: 3.8 mmol/L (ref 3.5–5.1)
Sodium: 138 mmol/L (ref 135–145)

## 2024-01-21 MED ORDER — ASPIRIN 81 MG PO TBEC: 81.0000 mg | DELAYED_RELEASE_TABLET | Freq: Every day | ORAL | Status: AC

## 2024-01-21 MED ORDER — FUROSEMIDE 20 MG PO TABS
20.0000 mg | ORAL_TABLET | Freq: Every day | ORAL | Status: DC
  Administered 2024-01-21: 20 mg via ORAL
  Filled 2024-01-21: qty 1

## 2024-01-21 MED ORDER — RISAQUAD PO CAPS: 2.0000 | ORAL_CAPSULE | Freq: Every day | ORAL | Status: AC

## 2024-01-21 MED ORDER — ACETAMINOPHEN 325 MG PO TABS: 650.0000 mg | ORAL_TABLET | Freq: Four times a day (QID) | ORAL | Status: AC | PRN

## 2024-01-21 MED ORDER — VITAMIN D (ERGOCALCIFEROL) 1.25 MG (50000 UNIT) PO CAPS: 50000.0000 [IU] | ORAL_CAPSULE | ORAL | Status: AC

## 2024-01-21 MED ORDER — CLOPIDOGREL BISULFATE 75 MG PO TABS: 75.0000 mg | ORAL_TABLET | Freq: Every day | ORAL | Status: AC

## 2024-01-21 MED ORDER — ATORVASTATIN CALCIUM 10 MG PO TABS: 10.0000 mg | ORAL_TABLET | Freq: Every day | ORAL | Status: AC

## 2024-01-21 MED ORDER — FUROSEMIDE 20 MG PO TABS: 40.0000 mg | ORAL_TABLET | Freq: Every day | ORAL | Status: AC

## 2024-01-21 MED ORDER — LOPERAMIDE HCL 2 MG PO CAPS: 4.0000 mg | ORAL_CAPSULE | Freq: Four times a day (QID) | ORAL | Status: AC | PRN

## 2024-01-21 NOTE — TOC Transition Note (Signed)
 Transition of Care [facility]) - Discharge Note   Patient Details  Name: John Booker MRN: [id number] Date of Birth: Jul 15, 1950  Transition [facility] Care [facility] CM/SW Contact:  Nathanael CHRISTELLA Ring, RN Phone Number: 01/21/2024, 1:49 PM   Clinical Narrative:     Discharge order and summary are in, daughter contacted and she is on the way to the hospital and she will transport patient to [facility].  Patient will be going to room 1003, bedside RN to call report to facility.  Daughter has no questions or concerns at this time.  Patient Goals and CMS Choice            Discharge Placement                Patient to be transferred to facility by: Daughter Name of family member notified: [name] Thaxton Patient and family notified of of transfer: 01/21/24  Discharge Plan and Services Additional resources added to the After Visit Summary for       Post Acute Care Choice: Home Health          DME Arranged: N/A                    Social Drivers of Health (SDOH) Interventions SDOH Screenings   Food Insecurity: No Food Insecurity (01/07/2024)  Housing: Low Risk  (01/07/2024)  Transportation Needs: No Transportation Needs (01/07/2024)  Utilities: Not At Risk (01/07/2024)  Depression (PHQ2-9): Low Risk  (12/12/2021)  Financial Resource Strain: Low Risk  (05/07/2023)   Received from [facility]  Social Connections: Socially Isolated (01/07/2024)  Tobacco Use: Medium Risk (01/14/2024)     Readmission Risk Interventions    07/13/2023   12:44 PM  Readmission Risk Prevention Plan  Transportation Screening Complete  PCP or Specialist Appt within 5-7 Days Complete  Medication Review (RN CM) Complete

## 2024-01-21 NOTE — Progress Notes (Signed)
 Occupational Therapy Treatment Patient Details Name: John Booker MRN: [id number] DOB: [birth year] Today's Date: 01/21/2024   History of present illness Pt is a 73 y/o M admitted on 01/07/24 following a fall, c/o worsening BLE & BUE edema. Pt is being treated for anasarca, BLE cellulitis, ischemic R 1st toe ulcer & ischemia of 3rd toe. PMH: HTN, HFpEF, CKD 2, lymphedema of BLE, chronic hiccups, EtOH abuse.  Hospital course significant for R LE aortogram/angiogram (11/3), RR LE thrombectomy (11/6), R great toe, 3rd toe amputation and 5th MTPJ debridement (11/7).  Cleared per podiatry for [facility] short-distance with post-op shoe to R LE.   OT comments  Patient seen for OT treatment on this date. Upon arrival to room patient semi fowlers in bed, agreeable to treatment. Patient transitioned to seated EOB with SBA, refused to attempt to don sock/post op shoe- total A provided. Patient performed sit<>stand with bed elevated and CGA provided, reports needing to use bathroom, ambulated about 15 feet to bathroom with rw with CGA, BSC set up over toilet and performed transfer with CGA/min A. Patient required increased time to void, reports minimal dizziness which resided with sitting. Patient ambulated back to bed with r/w.  Patient ended treatment in bed with NT present. Patient making fair progress toward goals, will continue to follow POC. Discharge recommendation remains appropriate, plans to DC to SNF today.       If plan is discharge home, recommend the following:  Two people to help with walking and/or transfers;Two people to help with bathing/dressing/bathroom;Supervision due to cognitive status;Direct supervision/assist for medications management;Help with stairs or ramp for entrance;Assist for transportation;Assistance with cooking/housework   Equipment Recommendations  Other (comment) (defer to next venue of care)    Recommendations for Other Services      Precautions / Restrictions  Precautions Precautions: Fall Recall of Precautions/Restrictions: Intact Required Braces or Orthoses: Other Brace Other Brace: post-op shoe Restrictions Weight Bearing Restrictions Per Provider Order: Yes RLE Weight Bearing Per Provider Order: Weight bearing as tolerated Other Position/Activity Restrictions: WBAT + post-op shoe       Mobility Bed Mobility Overal bed mobility: Needs Assistance Bed Mobility: Supine to Sit     Supine to sit: Min assist Sit to supine: Supervision   General bed mobility comments: SBA, pain with mobility but no physical A required    Transfers Overall transfer level: Needs assistance Equipment used: Rolling walker (2 wheels) Transfers: Sit to/from Stand Sit to Stand: Min assist, Contact guard assist           General transfer comment: min A to for sit<>stand with cues for hand placement     Balance Overall balance assessment: Needs assistance Sitting-balance support: No upper extremity supported, Feet supported Sitting balance-Leahy Scale: Good     Standing balance support: During functional activity, Reliant on assistive device for balance, Bilateral upper extremity supported Standing balance-Leahy Scale: Fair Standing balance comment: reliant on RW for balance, SBA                           ADL either performed or assessed with clinical judgement   ADL Overall ADL's : Needs assistance/impaired                     Lower Body Dressing: Sit to/from stand;Maximal assistance Lower Body Dressing Details (indicate cue type and reason): MAX A for donning L sock and R post-op shoe, anticiapte MAX A for LB dressing involving STS  transfers with RW Toilet Transfer: Contact guard assist;Rolling walker (2 wheels);Grab bars   Toileting- Clothing Manipulation and Hygiene: Moderate assistance;Sit to/from stand Toileting - Clothing Manipulation Details (indicate cue type and reason): max A for standing pericare       General  ADL Comments: requires coaxing to participate; unable to reach B distal LE to don socks/post op shoe    Extremity/Trunk Assessment              Vision       Perception     Praxis     Communication Communication Communication: Impaired Factors Affecting Communication: Hearing impaired   Cognition Arousal: Alert Behavior During Therapy: Flat affect Cognition: No family/caregiver present to determine baseline                               Following commands: Intact Following commands impaired: Follows one step commands with increased time, Follows multi-step commands with increased time      Cueing   Cueing Techniques: Verbal cues, Gestural cues, Tactile cues, Visual cues  Exercises      Shoulder Instructions       General Comments      Pertinent Vitals/ Pain       Pain Assessment Pain Assessment: 0-10 Pain Score: 3  Pain Location: RLE Pain Descriptors / Indicators: Grimacing, Discomfort, Guarding Pain Intervention(s): Monitored during session  Home Living                                          Prior Functioning/Environment              Frequency  Min 2X/week        Progress Toward Goals  OT Goals(current goals can now be found in the care plan section)  Progress towards OT goals: Progressing toward goals  Acute Rehab OT Goals Patient Stated Goal: to get home OT Goal Formulation: With patient Time For Goal Achievement: 01/30/24 Potential to Achieve Goals: Fair ADL Goals Pt Will Perform Grooming: with modified independence;sitting Pt Will Perform Lower Body Dressing: with mod assist;sitting/lateral leans Pt Will Transfer to Toilet: with contact guard assist;ambulating Pt Will Perform Toileting - Clothing Manipulation and hygiene: sit to/from stand;with min assist  Plan      Co-evaluation                 AM-PAC OT 6 Clicks Daily Activity     Outcome Measure   Help from another person eating  meals?: None Help from another person taking care of personal grooming?: A Little Help from another person toileting, which includes using toliet, bedpan, or urinal?: A Lot Help from another person bathing (including washing, rinsing, drying)?: A Lot Help from another person to put on and taking off regular upper body clothing?: A Little Help from another person to put on and taking off regular lower body clothing?: A Lot 6 Click Score: 16    End of Session Equipment Utilized During Treatment: Gait belt;Rolling walker (2 wheels)  OT Visit Diagnosis: Muscle weakness (generalized) (M62.81);Other abnormalities of gait and mobility (R26.89)   Activity Tolerance Patient tolerated treatment well   Patient Left in bed;with call bell/phone within reach;with bed alarm set;with nursing/sitter in room   Nurse Communication          Time: 1204-1229 OT Time Calculation (min): 25 min  Charges: OT General Charges $OT Visit: 1 Visit OT Treatments $Self Care/Home Management : 23-37 mins  Rogers Clause, OT/L MSOT, 01/21/2024

## 2024-01-21 NOTE — Discharge Summary (Addendum)
 Physician Discharge Summary   Patient: John Booker MRN: [id number] DOB: 08/14/1950  Admit date:     01/07/2024  Discharge date: 01/21/24  Discharge Physician: Laree Lock   PCP: Alla Amis, MD   Recommendations at discharge:   Follow up with provider at SNF - Repeat BMP, CBC in 5 days Monitor BP - titrate meds as needed  Follow up with GI outpatient Follow up with Vascular surgery outpatient Follow up with Podiatry outpatient Follow up with Palliative care outpatient  Discharge Diagnoses: Principal Problem:   Orthostatic hypotension Active Problems:   Diarrhea   Anasarca   PAD (peripheral artery disease)   Cellulitis of left lower extremity   Esophageal dysphagia   Stricture of esophagus   AKI (acute kidney injury)   Hypomagnesemia   Hypokalemia   Hyponatremia   Protein-calorie malnutrition, moderate   Gangrene [facility])   Hospital Course: 73 y.o. male with Past medical history of HTN, HFpEF, CKD stage II, lymphedema of bilateral lower extremities, esophageal stricture s/p dilatation, chronic hiccups as reviewed from EMR, presented at [facility] ED with complaining of bilateral lower extremity and upper extremity edema which is gradually getting worse.  Patient is not a very good historian, as per patient's daughter lower extremity edema is gradually getting worse for the past 1 week and weeping.  There is an area of erythema in the left lower extremity on the lateral side which he scratched maybe had trauma.  Patient reported no fever.  While patient was getting ready to come to the ED, he fell and landed on his buttocks, no LOC.  Denies any pain after fall.    11/3.  Vascular surgery brought to the procedure suite for angiogram.  No intervention was done secondary to the patient needing an endoscopy and needing to be off anticoagulation.  The patient does have peripheral vascular disease.  Extensive lower extremity revascularization at a later date. 11/4.  EGD was done  by Dr. Jinny and a benign-appearing esophageal stenosis was dilated. 11/5.  Patient still having trouble eating.  Case discussed with gastroenterology and will get speech pathology to evaluate for potential barium swallow.  May need manometry as outpatient. 11/6.  Dr. Marea did a mechanical thrombectomy of the right SFA, tibioperoneal trunk and posterior tibial artery, stent placement right SFA, angioplasty right posterior tibial artery and tibioperoneal trunk, stent placement right tibioperoneal trunk. 11/7.  Dr. Ashley did an amputation of the interphalangeal joint great toe and amputation of the third toe on the right foot secondary to gangrene. 11/8.  No pain in right foot after amputation.  Podiatry cleared to get out of the hospital.  Looking into rehab beds. 11/9.  Medically stable to go out to rehab once bed available and insurance authorizes. 11/10.  Patient having some dizziness this afternoon.  Patient orthostatic with physical therapy.  Will hold antihypertensive medications and give a fluid bolus.  Imodium  as needed for diarrhea.  Stop antibiotic as per patient request.  Patient orthostatic with physical therapy yesterday evening.  Antihypertensive medications held.  Fluid bolus given. 11/11.  Still had a lot of diarrhea overnight.  Increased Imodium  to 4 mg every 6 hours as needed 11/12: Diarrhea improving, had 2 episodes today.  Will discontinue IV fluids.  Repeat orthostatics pending 11/13: Diarrhea resolved, BP improved - off midodrine. Resume Lasix  and Metoprolol . Discharge to SNF. No new complaints. Spoke with daughter     Assessment and Plan: Orthostatic hypotension - resolved Discontinued IV fluids and Midodrine Resume Lasix   40mg , Metoprolol . Losartan  on hold    Diarrhea - resolved Likely secondary to antibiotics.  Discontinue antibiotics on 11/10 at patient request.   As needed Imodium .  White count normal and no fever.  No indication for C. difficile testing.  Continue  probiotic.   Anasarca Initially was started on Lasix  drip.  Was off diuretics for procedures.  Resume lasix  40mg  and potassium supplements at home Patient did not want compression wraps   PAD (peripheral artery disease) 11/6. Patient had mechanical thrombectomy of the right SFA, tibioperoneal trunk and posterior tibial artery, stent placement to right SFA, angioplasty of right posterior tibial artery and tibioperoneal trunk, stent placement right tibioperoneal trunk.  On aspirin , Plavix and Lipitor.  Patient declined compression wraps.   Cellulitis of left lower extremity Cellulitis of bilateral lower extremities.  Gangrene right third toe and part of the first toe,  Left foot ulcer.  Also on the left foot areas of scabs which could be starting of gangrene.  Patient received vancomycin  and Rocephin  and switched over to Augmentin .  Had toe amputations 11/7 by Dr. Ashley.  Podiatry recommended continuing Augmentin  till the 14th.  Since patient having diarrhea we discontinued antibiotics on 11/10.   Wound care: Remove dressing to right foot and to bilateral lower extremities every 3 days. Apply Xeroform gauze to the right foot incisions and 4 x 4 gauze between toes, 4 x 4 gauze, Kerlix from forefoot to below knee, Ace wrap from forefoot to below knee to bilateral lower extremities.    Foam dressings to bilateral leg wounds change every 3 days or as needed soiling.     Esophageal dysphagia Continue PPI.  EGD with dilation on 11/4.  Appreciate speech pathology consultation.  Continue Protonix .  May end up needing outpatient manometry.  Patient declined PEG tube.   Stricture of esophagus Dilated on 11/4.  Continue PPI.   Hypomagnesemia Treated   AKI (acute kidney injury) Resolved.   Hypokalemia Treated. On potassium supplements with lasix  at home   Hyponatremia Normalized  Vit D deficiency Vit D 50k weekly x 6 weeks followed by 2000IU daily   Protein-calorie malnutrition,  moderate Continue supplements    -- [facility] palliative care referral to follow at [facility]    Pain control - [place]ide  Controlled Substance Reporting System database was reviewed. and patient was instructed, not to drive, operate heavy machinery, perform activities at heights, swimming or participation in water activities or provide baby-sitting services while on Pain, Sleep and Anxiety Medications; until their outpatient Physician has advised to do so again. Also recommended to not to take more than prescribed Pain, Sleep and Anxiety Medications.  Consultants: GI, Vascular surgery, Podiatry, Palliative care Disposition: Skilled nursing facility Diet recommendation:  Discharge Diet Orders (From admission, onward)     Start     Ordered   01/21/24 0000  Diet - low sodium heart healthy        01/21/24 1209            DISCHARGE MEDICATION: Allergies as of 01/21/2024       Reactions   Adalat [nifedipine]    Foot swelling.    Amlodipine  Other (See Comments)   Leg swelling        Medication List     PAUSE taking these medications    losartan  100 MG tablet Wait to take this until your doctor or other care provider tells you to start again. Commonly known as: COZAAR  Take 1 tablet (100 mg total) by mouth  daily.       STOP taking these medications    ascorbic acid  500 MG tablet Commonly known as: VITAMIN C    HYDROcodone -acetaminophen  5-325 MG tablet Commonly known as: NORCO/VICODIN   methocarbamol  500 MG tablet Commonly known as: ROBAXIN    vitamin D3 25 MCG tablet Commonly known as: CHOLECALCIFEROL        TAKE these medications    acetaminophen  325 MG tablet Commonly known as: TYLENOL  Take 2 tablets (650 mg total) by mouth every 6 (six) hours as needed for mild pain (pain score 1-3) (or Fever >/= 101).   acidophilus Caps capsule Take 2 capsules by mouth daily for 5 days. Start taking on: January 22, 2024   aspirin  EC 81 MG tablet Take 1  tablet (81 mg total) by mouth daily. Swallow whole. Start taking on: January 22, 2024   atorvastatin 10 MG tablet Commonly known as: LIPITOR Take 1 tablet (10 mg total) by mouth daily. Start taking on: January 22, 2024   clopidogrel 75 MG tablet Commonly known as: PLAVIX Take 1 tablet (75 mg total) by mouth daily. Start taking on: January 22, 2024   furosemide  20 MG tablet Commonly known as: LASIX  Take 2 tablets (40 mg total) by mouth daily. What changed: how much to take   loperamide  2 MG capsule Commonly known as: IMODIUM  Take 2 capsules (4 mg total) by mouth every 6 (six) hours as needed for diarrhea or loose stools.   metoprolol  succinate 25 MG 24 hr tablet Commonly known as: TOPROL -XL Take 1 tablet (25 mg total) by mouth daily.   ondansetron  4 MG tablet Commonly known as: ZOFRAN  Take 1 tablet (4 mg total) by mouth every 8 (eight) hours as needed for nausea or vomiting.   ondansetron  4 MG tablet Commonly known as: ZOFRAN  Take 1 tablet (4 mg total) by mouth every 6 (six) hours as needed for nausea, refractory nausea / vomiting or vomiting.   pantoprazole  40 MG tablet Commonly known as: PROTONIX  Take 1 tablet (40 mg total) by mouth 2 (two) times daily.   potassium chloride  SA 20 MEQ tablet Commonly known as: KLOR-CON  M Take 20 mEq by mouth daily.   prochlorperazine  10 MG tablet Commonly known as: COMPAZINE  Take 1 tablet (10 mg total) by mouth every 8 (eight) hours as needed for nausea or vomiting.   thiamine  100 MG tablet Commonly known as: Vitamin B-1 Take 1 tablet (100 mg total) by mouth daily.   Vitamin D  (Ergocalciferol ) 1.25 MG (50000 UNIT) Caps capsule Commonly known as: DRISDOL  Take 1 capsule (50,000 Units total) by mouth every 7 (seven) days for 6 doses. Start taking on: January 23, 2024               Discharge Care Instructions  (From admission, onward)           Start     Ordered   01/21/24 0000  Discharge wound care:        Comments: Remove dressing to right foot and to bilateral lower extremities every 3 days. Apply Xeroform gauze to the right foot incisions and 4 x 4 gauze between toes, 4 x 4 gauze, Kerlix from forefoot to below knee, Ace wrap from forefoot to below knee to bilateral lower extremities.    Foam dressings to bilateral leg wounds change every 3 days or as needed soiling   01/21/24 1209            Contact information for follow-up providers     Ashley Soulier,  DPM. Schedule an appointment as soon as possible for a visit in 1 week(s).   Specialty: Podiatry Why: For wound re-check Contact information: [address] [place] [address] [phone]              Contact information for after-discharge care     Destination     [facility] .   Service: Skilled Nursing Contact information: [address] [place]  [address] [phone]                    Discharge Exam: Filed Weights   01/07/24 1037 01/12/24 0600 01/14/24 0848  Weight: 83.9 kg 74.9 kg 74.9 kg    Cardiovascular:     Rate and Rhythm: Normal rate and regular rhythm.     Heart sounds: S1 normal and S2 normal. Murmur heard.     Systolic murmur is present with a grade of 2/6.  Pulmonary:     Breath sounds: No decreased breath sounds, wheezing, rhonchi or rales.  Abdominal:     Palpations: Abdomen is soft.     Tenderness: There is no abdominal tenderness.  Musculoskeletal:     Right lower leg: 1+ Swelling present.     Left lower leg: 1+ Swelling present.  Skin:    General: Skin is warm.     Comments: Right foot wrapped.  Chronic lower extremity skin discoloration.  Neurological:     Mental Status: He is alert.  Condition at discharge: fair  The results of significant diagnostics from this hospitalization (including imaging, microbiology, ancillary and laboratory) are listed below for reference.   Imaging Studies: DG MINI C-ARM IMAGE  ONLY Result Date: 01/15/2024 There is no interpretation for this exam.  This order is for images obtained during a surgical procedure.  Please See Surgeries Tab for more information regarding the procedure.   PERIPHERAL VASCULAR CATHETERIZATION Result Date: 01/14/2024 See surgical note for result.  PERIPHERAL VASCULAR CATHETERIZATION Result Date: 01/11/2024 See surgical note for result.  DG ESOPHAGUS W SINGLE CM (SOL OR THIN BA) Result Date: 01/10/2024 CLINICAL DATA:  73 year old male with a history of GERD, esophageal dysphagia, and previous barium swallow on 07/13/2023 concerning for high-grade stricture. Patient underwent EGD with dilation on 07/14/2023 with unclear benefits postprocedure. Patient continues to report minimal improvement in dysphagia. Request for repeat barium swallow to evaluate for dysmotility versus recurrent/persistent stricture. EXAM: ESOPHAGUS/BARIUM SWALLOW/TABLET STUDY TECHNIQUE: Single contrast examination was performed using thin liquid barium. This exam was performed by [facility] PA-C, and was supervised and interpreted by Dr. Ree Molt. FLUOROSCOPY: Radiation Exposure Index (as provided by the fluoroscopic device): 18.8 mGy Kerma COMPARISON:  DG ESOPHAGUS W SINGLE CM-07/13/2023 FINDINGS: Swallowing: Limited evaluation, but no evidence of gross aspiration. Pharynx: Unremarkable. Esophagus: Limited evaluation due to single contrast only; however, no evidence of mucosal abnormalities. Redemonstration of approximately 5 cm long smooth walled marked narrowing/stricture of the lower most esophagus extending up to the GE junction. There is proximal mild-to-moderate esophageal dilation. There are also resultant multiple underlying non propulsive peristaltic waves. Esophageal motility: Dysmotility with intermittent proximal escape. Significantly delayed passage of contrast from the distal esophagus into the stomach secondary to stricture. Findings are essentially similar  to the prior study no again represent benign stricture in the setting of chronic acid reflux. Correlate clinically and with upper GI endoscopy. Hiatal Hernia: None visualized. Gastroesophageal reflux: None visualized. However, evaluation is limited due to marked lower esophageal narrowing. Other: Limited evaluation due to minimal  patient mobility. Entirety of exam performed at approximately 20 degree table tilt. IMPRESSION: 1. Redemonstration of smooth walled high-grade stricture of the lower most thoracic esophagus reaching up to the GE junction. 2. Dysmotility likely secondary to stricture. Electronically Signed   By: Ree Molt M.D.   On: 01/10/2024 12:27   ECHOCARDIOGRAM COMPLETE Result Date: 01/10/2024    ECHOCARDIOGRAM REPORT   Patient Name:   DAQWAN DOUGAL Date of Exam: 01/09/2024 Medical Rec #:  [id number]      Height:       75.0 in Accession #:    [id number]     Weight:       185.0 lb Date of Birth:  16-Sep-1950      BSA:          2.123 m Patient Age:    72 years       BP:           129/87 mmHg Patient Gender: M              HR:           96 bpm. Exam Location:  [facility] Procedure: 2D Echo, Cardiac Doppler and Color Doppler (Both Spectral and Color            Flow Doppler were utilized during procedure). Indications:     CHF I50.31  History:         Patient has prior history of Echocardiogram examinations, most                  recent 11/24/2022.  Sonographer:     Thedora Louder RDCS, FASE Referring Phys:  [id number] ELVAN SOR Diagnosing Phys: Denyse Bathe  Sonographer Comments: Technically challenging study due to limited acoustic windows, no apical window, suboptimal parasternal window and no subcostal window. Image acquisition challenging due to respiratory motion and Image acquisition challenging due to  patient body habitus. The patient could not tolerate pressing on left side. Suboptimal apical acoustic windows. IMPRESSIONS  1. Left ventricular ejection fraction, by estimation, is 60 to 65%. The  left ventricle has normal function. The left ventricle has no regional wall motion abnormalities. Left ventricular diastolic function could not be evaluated.  2. Right ventricular systolic function is normal. The right ventricular size is normal.  3. A small pericardial effusion is present. The pericardial effusion is circumferential.  4. The mitral valve is normal in structure. No evidence of mitral valve regurgitation. No evidence of mitral stenosis.  5. The aortic valve is normal in structure. Aortic valve regurgitation is not visualized. No aortic stenosis is present.  6. The inferior vena cava is normal in size with greater than 50% respiratory variability, suggesting right atrial pressure of 3 mmHg. FINDINGS  Left Ventricle: Left ventricular ejection fraction, by estimation, is 60 to 65%. The left ventricle has normal function. The left ventricle has no regional wall motion abnormalities. Strain was performed and the global longitudinal strain is indeterminate. The left ventricular internal cavity size was normal in size. There is no left ventricular hypertrophy. Left ventricular diastolic function could not be evaluated. Right Ventricle: The right ventricular size is normal. No increase in right ventricular wall thickness. Right ventricular systolic function is normal. Left Atrium: Left atrial size was normal in size. Right Atrium: Right atrial size was normal in size. Pericardium: A small pericardial effusion is present. The pericardial effusion is circumferential. Mitral Valve: The mitral valve is normal in structure. No evidence of mitral valve regurgitation. No evidence of mitral  valve stenosis. Tricuspid Valve: The tricuspid valve is normal in structure. Tricuspid valve regurgitation is not demonstrated. No evidence of tricuspid stenosis. Aortic Valve: The aortic valve is normal in structure. Aortic valve regurgitation is not visualized. No aortic stenosis is present. Pulmonic Valve: The pulmonic valve  was normal in structure. Pulmonic valve regurgitation is not visualized. No evidence of pulmonic stenosis. Aorta: The aortic root is normal in size and structure. Venous: The inferior vena cava is normal in size with greater than 50% respiratory variability, suggesting right atrial pressure of 3 mmHg. IAS/Shunts: No atrial level shunt detected by color flow Doppler. Additional Comments: 3D was performed not requiring image post processing on an independent workstation and was indeterminate.  LEFT VENTRICLE PLAX 2D LVIDd:         3.10 cm LVIDs:         2.00 cm LV PW:         1.30 cm LV IVS:        1.00 cm LVOT diam:     2.40 cm LVOT Area:     4.52 cm  LEFT ATRIUM         Index LA diam:    2.70 cm 1.27 cm/m                        PULMONIC VALVE AORTA                 PV Vmax:       1.28 m/s Ao Root diam: 3.70 cm PV Peak grad:  6.6 mmHg   SHUNTS Systemic Diam: 2.40 cm Denyse Bathe Electronically signed by Denyse Bathe Signature Date/Time: 01/10/2024/8:50:12 AM    Final    US  Venous Img Lower Bilateral (DVT) Result Date: 01/08/2024 CLINICAL DATA:  Edema. EXAM: BILATERAL LOWER EXTREMITY VENOUS DOPPLER ULTRASOUND TECHNIQUE: Gray-scale sonography with graded compression, as well as color Doppler and duplex ultrasound were performed to evaluate the lower extremity deep venous systems from the level of the common femoral vein and including the common femoral, femoral, profunda femoral, popliteal and calf veins including the posterior tibial, peroneal and gastrocnemius veins when visible. Spectral Doppler was utilized to evaluate flow at rest and with distal augmentation maneuvers in the common femoral, femoral and popliteal veins. COMPARISON:  06/26/2021 FINDINGS: RIGHT LOWER EXTREMITY Common Femoral Vein: No evidence of thrombus. Normal compressibility, respiratory phasicity and response to augmentation. Saphenofemoral Junction: No evidence of thrombus. Normal compressibility and flow on color Doppler imaging. Profunda  Femoral Vein: No evidence of thrombus. Normal compressibility and flow on color Doppler imaging. Femoral Vein: No evidence of thrombus. Normal compressibility, respiratory phasicity and response to augmentation. Popliteal Vein: No evidence of thrombus. Normal compressibility, respiratory phasicity and response to augmentation. Calf Veins: Visualized right deep calf veins are patent without thrombus. Limited evaluation. Other Findings:  None. LEFT LOWER EXTREMITY Common Femoral Vein: No evidence of thrombus. Normal compressibility, respiratory phasicity and response to augmentation. Saphenofemoral Junction: No evidence of thrombus. Normal compressibility and flow on color Doppler imaging. Profunda Femoral Vein: No evidence of thrombus. Normal compressibility and flow on color Doppler imaging. Femoral Vein: No evidence of thrombus. Normal compressibility, respiratory phasicity and response to augmentation. Popliteal Vein: No evidence of thrombus. Normal compressibility, respiratory phasicity and response to augmentation. Calf Veins: Limited evaluation. Other Findings:  None. IMPRESSION: No evidence of deep venous thrombosis in either lower extremity. Limited evaluation of the calf veins. Electronically Signed   By: Juliene Philip HERO.D.  On: 01/08/2024 16:56   US  ARTERIAL LOWER EXTREMITY DUPLEX BILATERAL Result Date: 01/08/2024 CLINICAL DATA:  Ischemia.  Right third toe is black. EXAM: BILATERAL LOWER EXTREMITY ARTERIAL DUPLEX SCAN TECHNIQUE: Gray-scale sonography as well as color Doppler and duplex ultrasound was performed to evaluate the arteries of both lower extremities including the common, superficial and profunda femoral arteries, popliteal artery and calf arteries. COMPARISON:  None Available. FINDINGS: Right Lower Extremity ABI: Patient declined this procedure. Inflow: Right common femoral artery is patent with probable biphasic Doppler waveform. Outflow: Right profunda femoral artery is patent. Origin of the  right SFA is patent. Occlusion involving the proximal and mid right SFA. Distal SFA is patent with monophasic waveform. Right popliteal artery is patent with monophasic waveform. Runoff: Right posterior tibial artery and right anterior tibial artery are patent with monophasic waveforms. Right dorsalis pedis artery is patent with monophasic waveform. Left Lower Extremity ABI: Patient declined this procedure Inflow: Left common femoral artery is patent with a monophasic waveform. Outflow: Left profunda femoral artery is patent. Plaque in the left SFA. Left SFA is patent with monophasic waveform. Peak systolic velocity in the mid left SFA is 152 cm/sec. Peak systolic velocity in the distal left SFA is 227 cm/sec. Findings raise concern for some stenosis in the distal left SFA. Left popliteal artery is patent with monophasic waveform. Runoff: Left posterior tibial artery and left anterior tibial artery are patent with monophasic waveforms. Left dorsalis pedis artery is patent with monophasic waveform. IMPRESSION: 1. Right outflow disease. Occlusive disease involving the proximal and mid right superficial femoral artery. 2. Probable stenosis in the distal left superficial femoral artery. 3. Bilateral runoff vessels are patent. Electronically Signed   By: Juliene Balder M.D.   On: 01/08/2024 16:51   DG Chest Portable 1 View Result Date: 01/07/2024 CLINICAL DATA:  Fluid overload. EXAM: PORTABLE CHEST 1 VIEW COMPARISON:  07/11/2023 FINDINGS: Normal-sized heart. Tortuous aorta. Clear lungs with normal vascularity. Thoracic spine degenerative changes with changes of DISH. IMPRESSION: No acute abnormality.  No radiographic evidence of fluid overload. Electronically Signed   By: Elspeth Bathe M.D.   On: 01/07/2024 12:33    Microbiology: Results for orders placed or performed during the hospital encounter of 01/07/24  Blood culture (routine x 2)     Status: None   Collection Time: 01/07/24  1:56 PM   Specimen: BLOOD   Result Value Ref Range Status   Specimen Description BLOOD BLOOD LEFT ARM  Final   Special Requests   Final    BOTTLES DRAWN AEROBIC AND ANAEROBIC Blood Culture adequate volume   Culture   Final    NO GROWTH 5 DAYS Performed at [facility], [address] [place], [place] [address]    Report Status 01/12/2024 FINAL  Final  MRSA Next Gen by PCR, Nasal     Status: None   Collection Time: 01/07/24  4:15 PM   Specimen: Nasal Mucosa; Nasal Swab  Result Value Ref Range Status   MRSA by PCR Next Gen NOT DETECTED NOT DETECTED Final    Comment: (NOTE) The GeneXpert MRSA Assay (FDA approved for NASAL specimens only), is one component of a comprehensive MRSA colonization surveillance program. It is not intended to diagnose MRSA infection nor to guide or monitor treatment for MRSA infections. Test performance is not FDA approved in patients less than 21 years old. Performed at [facility], [address], [place], [place] [address]   Blood culture (routine x 2)  Status: None   Collection Time: 01/07/24 10:06 PM   Specimen: BLOOD  Result Value Ref Range Status   Specimen Description BLOOD BLOOD RIGHT HAND  Final   Special Requests   Final    BOTTLES DRAWN AEROBIC AND ANAEROBIC Blood Culture adequate volume   Culture   Final    NO GROWTH 5 DAYS Performed at [facility], [address] [place], [place] [address]    Report Status 01/12/2024 FINAL  Final    Labs: CBC: Recent Labs  Lab 01/15/24 0839 01/17/24 0417 01/19/24 0410 01/20/24 0353  WBC 7.4 7.0 6.5 6.5  HGB 10.9* 10.0* 10.0* 9.3*  HCT 34.1* 31.6* 31.7* 29.1*  MCV 98.6 100.6* 100.3* 99.7  PLT 283 270 293 299   Basic Metabolic Panel: Recent Labs  Lab 01/15/24 0839 01/17/24 0417 01/19/24 0410 01/20/24 0353 01/21/24 0547  NA 136 138 136 138 138  K 4.2 3.9 4.0 4.4 3.8  CL 98 100 100 105 104  CO2 30 31 28 25 28   GLUCOSE 114* 93 102* 84 89  BUN 23 21 21 17 15   CREATININE 0.84  0.91 0.80 0.66 0.64  CALCIUM 8.3* 8.3* 8.6* 8.7* 8.7*   Liver Function Tests: No results for input(s): AST, ALT, ALKPHOS, BILITOT, PROT, ALBUMIN in the last 168 hours. CBG: No results for input(s): GLUCAP in the last 168 hours.  Discharge time spent: greater than 30 minutes.  Signed: Laree Lock, MD [facility] 01/21/2024

## 2024-01-21 NOTE — Plan of Care (Signed)
 PMT shadowing noted.  Patient ready for discharge today to [facility] and rehab.  PMT will sign off at this time.  Please reconsult if needs arise.

## 2024-01-21 NOTE — TOC Progression Note (Signed)
 Transition of Care [facility]) - Progression Note    Patient Details  Name: John Booker MRN: [id number] Date of Birth: March 21, 1950  Transition of Care [facility]) CM/SW Contact  Nathanael CHRISTELLA Ring, RN Phone Number: 01/21/2024, 9:28 AM  Clinical Narrative:     Called and spoke with patient's daughter, [name], potential for discharge today to [facility].  He does not qualify for EMS transport to facility.  She is able to transport him to [place] when he is discharged.  He is able to stand and pivot transfer to get in and out of a vehicle and into a wheelchair.  Once the DC order is placed CM will call and update daughter.   Expected Discharge Plan: Home w Home Health Services Barriers to Discharge: Continued Medical Work up               Expected Discharge Plan and Services     Post Acute Care Choice: Home Health Living arrangements for the past 2 months: Single Family Home Expected Discharge Date: 01/17/24                                     Social Drivers of Health (SDOH) Interventions SDOH Screenings   Food Insecurity: No Food Insecurity (01/07/2024)  Housing: Low Risk  (01/07/2024)  Transportation Needs: No Transportation Needs (01/07/2024)  Utilities: Not At Risk (01/07/2024)  Depression (PHQ2-9): Low Risk  (12/12/2021)  Financial Resource Strain: Low Risk  (05/07/2023)   Received from [facility]  Social Connections: Socially Isolated (01/07/2024)  Tobacco Use: Medium Risk (01/14/2024)    Readmission Risk Interventions    07/13/2023   12:44 PM  Readmission Risk Prevention Plan  Transportation Screening Complete  PCP or Specialist Appt within 5-7 Days Complete  Medication Review (RN CM) Complete

## 2024-01-30 NOTE — Anesthesia Postprocedure Evaluation (Signed)
 Anesthesia Post Note  Patient: MAZIN EMMA  Procedure(s) Performed: Lower Extremity Angiography (Right)  Patient location during evaluation: PACU Anesthesia Type: General Level of consciousness: awake and alert Pain management: pain level controlled Vital Signs Assessment: post-procedure vital signs reviewed and stable Respiratory status: spontaneous breathing, nonlabored ventilation, respiratory function stable and patient connected to nasal cannula oxygen Cardiovascular status: blood pressure returned to baseline and stable Postop Assessment: no apparent nausea or vomiting Anesthetic complications: no   There were no known notable events for this encounter.   Last Vitals:  Vitals:   01/21/24 0406 01/21/24 0700  BP: (!) 142/72 (!) 140/79  Pulse: 77 74  Resp: 16 16  Temp: 36.6 C 36.5 C  SpO2: 100%     Last Pain:  Vitals:   01/21/24 0800  TempSrc:   PainSc: 0-No pain                 Prentice Murphy

## 2024-02-11 ENCOUNTER — Telehealth (INDEPENDENT_AMBULATORY_CARE_PROVIDER_SITE_OTHER): Payer: Self-pay

## 2024-02-11 NOTE — Telephone Encounter (Signed)
 Patient had a right leg angio on 01/14/24 and an amputation by Dr. Ashley on 01/15/24. Last seen in our office 06/20/21. Never came back for a follow up from his angio. Daughter called wanting to know why his foot is swelling. I attempted to steer her to have the patient follow up appt scheduled and she became irate and wanting to know why I can just answer her question or have the doctor call her. Please advise.

## 2024-02-11 NOTE — Telephone Encounter (Signed)
 After contacting the daughter I attempted to give her the recommendation from Dr. Marea and she abruptly requested that she needs to schedule a follow up. She was transferred to front desk.

## 2024-02-12 ENCOUNTER — Telehealth (INDEPENDENT_AMBULATORY_CARE_PROVIDER_SITE_OTHER): Payer: Self-pay | Admitting: Vascular Surgery

## 2024-02-15 ENCOUNTER — Other Ambulatory Visit: Payer: Self-pay

## 2024-02-15 ENCOUNTER — Emergency Department

## 2024-02-15 ENCOUNTER — Emergency Department
Admission: EM | Admit: 2024-02-15 | Discharge: 2024-02-15 | Disposition: A | Discharge status: Home / Self Care | Attending: Emergency Medicine | Admitting: Emergency Medicine

## 2024-02-15 DIAGNOSIS — Z87891 Personal history of nicotine dependence: Secondary | ICD-10-CM

## 2024-02-15 DIAGNOSIS — Z79899 Other long term (current) drug therapy: Secondary | ICD-10-CM

## 2024-02-15 DIAGNOSIS — I872 Venous insufficiency (chronic) (peripheral): Secondary | ICD-10-CM | POA: Diagnosis not present

## 2024-02-15 DIAGNOSIS — Z7982 Long term (current) use of aspirin: Secondary | ICD-10-CM

## 2024-02-15 DIAGNOSIS — I890 Lymphedema, not elsewhere classified: Secondary | ICD-10-CM

## 2024-02-15 LAB — CBC
HCT: 34.2 % — ABNORMAL LOW (ref 39.0–52.0)
Hemoglobin: 11 g/dL — ABNORMAL LOW (ref 13.0–17.0)
MCH: 30.9 pg (ref 26.0–34.0)
MCHC: 32.2 g/dL (ref 30.0–36.0)
MCV: 96.1 fL (ref 80.0–100.0)
Platelets: 230 K/uL (ref 150–400)
RBC: 3.56 MIL/uL — ABNORMAL LOW (ref 4.22–5.81)
RDW: 11.9 % (ref 11.5–15.5)
WBC: 6.1 K/uL (ref 4.0–10.5)
nRBC: 0 % (ref 0.0–0.2)

## 2024-02-15 LAB — BASIC METABOLIC PANEL WITH GFR
Anion gap: 8 (ref 5–15)
BUN: 12 mg/dL (ref 8–23)
CO2: 26 mmol/L (ref 22–32)
Calcium: 9.7 mg/dL (ref 8.9–10.3)
Chloride: 107 mmol/L (ref 98–111)
Creatinine, Ser: 0.95 mg/dL (ref 0.61–1.24)
GFR, Estimated: >60 mL/min (ref >60)
Glucose, Bld: 76 mg/dL (ref 70–99)
Potassium: 4.5 mmol/L (ref 3.5–5.1)
Sodium: 141 mmol/L (ref 135–145)

## 2024-02-15 LAB — PROTIME-INR
INR: 0.9 (ref 0.8–1.2)
Prothrombin Time: 13 s (ref 11.4–15.2)

## 2024-02-15 LAB — PRO BRAIN NATRIURETIC PEPTIDE: Pro Brain Natriuretic Peptide: 525 pg/mL — ABNORMAL HIGH (ref <300.0)

## 2024-02-15 LAB — TROPONIN T, HIGH SENSITIVITY
Troponin T High Sensitivity: 30 ng/L — ABNORMAL HIGH (ref 0–19)
Troponin T High Sensitivity: 30 ng/L — ABNORMAL HIGH (ref 0–19)

## 2024-02-15 MED ORDER — OXYCODONE HCL 5 MG PO TABS
5.0000 mg | ORAL_TABLET | Freq: Once | ORAL | Status: AC
  Administered 2024-02-15: 5 mg via ORAL
  Filled 2024-02-15: qty 1

## 2024-02-15 MED ORDER — OXYCODONE HCL 5 MG PO TABS: 2.5000 mg | ORAL_TABLET | Freq: Four times a day (QID) | ORAL | 0 refills | Status: AC | PRN

## 2024-02-15 NOTE — ED Provider Notes (Signed)
 [facility] Provider Note    Event Date/Time   First MD Initiated Contact with Patient 02/15/24 1106     (approximate)   History   Foot Pain   HPI  NAT LOWENTHAL is a 73 y.o. male who comes in with concerns for right foot swelling.  Patient had surgery on 11/7 to remove 2 toes.  He was revascularized in 11/6 by dr dew and discharged on 12/2.  He does report noticing the swelling a few days ago.  Patient does have increasing pain.  Patient is on Lasix .  Patient has some dizziness when he was in the hospital and he was orthostatic and patient was given a fluid bolus and antihypertensives were held.  Patient was resumed on medications prior to discharge.  Patient reports that his legs have gone down significantly and he had felt much improved while at the facility and then he came home from the facility 2 days ago and his legs had significant increase in swelling and some mild redness.  He denies any fevers.  He reports been compliant with medications.  Physical Exam   Triage Vital Signs: ED Triage Vitals  Encounter Vitals Group     BP 02/15/24 1043 (!) 137/98     Girls Systolic BP Percentile --      Girls Diastolic BP Percentile --      Boys Systolic BP Percentile --      Boys Diastolic BP Percentile --      Pulse Rate 02/15/24 1043 85     Resp 02/15/24 1043 18     Temp 02/15/24 1043 98.1 F (36.7 C)     Temp src --      SpO2 --      Weight 02/15/24 1044 165 lb 2 oz (74.9 kg)     Height 02/15/24 1044 6' 3 (1.905 m)     Head Circumference --      Peak Flow --      Pain Score 02/15/24 1044 10     Pain Loc --      Pain Education --      Exclude from Growth Chart --     Most recent vital signs: Vitals:   02/15/24 1043  BP: (!) 137/98  Pulse: 85  Resp: 18  Temp: 98.1 F (36.7 C)     General: Awake, no distress.  CV:  Good peripheral perfusion.  Resp:  Normal effort.  Abd:  No distention.  Other:  See media tab.  Slightly cooler toes on  the left but able to get a DP pulse bilaterally and these were marked..  Multiple areas of some scattered redness, with a little bit of a ulcer noted on the top of the right foot.  See media tab.   ED Results / Procedures / Treatments   Labs (all labs ordered are listed, but only abnormal results are displayed) Labs Reviewed  BASIC METABOLIC PANEL WITH GFR  CBC  PROTIME-INR  PRO BRAIN NATRIURETIC PEPTIDE  TROPONIN T, HIGH SENSITIVITY     EKG  My interpretation of EKG:  Sinus rate of 83 without any ST elevation or T wave inversions, normal intervals  RADIOLOGY I have reviewed the xray personally and interpreted no pneumonia   PROCEDURES:  Critical Care performed: No  .1-3 Lead EKG Interpretation  Performed by: Ernest Ronal BRAVO, MD Authorized by: Ernest Ronal BRAVO, MD     Interpretation: normal     ECG rate:  70   ECG  rate assessment: normal     Rhythm: sinus rhythm     Ectopy: none     Conduction: normal      MEDICATIONS ORDERED IN ED: Medications  oxyCODONE  (Oxy IR/ROXICODONE ) immediate release tablet 5 mg (5 mg Oral Given 02/15/24 1213)     IMPRESSION / MDM / ASSESSMENT AND PLAN / ED COURSE  I reviewed the triage vital signs and the nursing notes.   Patient's presentation is most consistent with acute presentation with potential threat to life or bodily function.   Patient comes in with increasing and leg swelling postprocedure.  His BNP is slightly elevated and troponin was slightly elevated more likely demand from some mild fluid.  His CBC shows stable hemoglobin no white count.  Coags are normal.  I have added on ultrasound to evaluate for DVT I do not think he has evidence of acute limb ischemia have sent a message to Dr. Marea for them to evaluate patient.  Patient given some oxycodone  to help with pain.  Patient has been evaluated by vascular team.  They do not feel there is any evidence of active infection or ischemia or concerns for any issues.  This is most  likely related to his lymphedema given he is now back up on his feet that the swelling has come back from his lymphedema.  Recommended Unna boots and social work to put in for him to have these changed every week.  These were ordered and done by vascular.  Patient be handed off to oncoming team pending ultrasound read, repeat troponin and if these are stable family feel comfortable with outpatient follow-up.  I will prescribe a short course of oxycodone  to help with severe pain I discussed with the daughter about the risk including constipation, confusion, falls but she states that it did seem to help him here and she did want a few to have at home for worst-case scenario.  The patient is on the cardiac monitor to evaluate for evidence of arrhythmia and/or significant heart rate changes.      FINAL CLINICAL IMPRESSION(S) / ED DIAGNOSES   Final diagnoses:  Lymphedema     Rx / DC Orders   ED Discharge Orders          Ordered    Discharge wound care:       Comments: Needs unna boots placed and changed weekly   02/15/24 1423    oxyCODONE  (ROXICODONE ) 5 MG immediate release tablet  Every 6 hours PRN        02/15/24 1459             Note:  This document was prepared using Dragon voice recognition software and may include unintentional dictation errors.   Ernest Ronal BRAVO, MD 02/15/24 1501

## 2024-02-15 NOTE — TOC Initial Note (Signed)
 Transition of Care [facility]) - Initial/Assessment Note    Patient Details  Name: John Booker MRN: [id number] Date of Birth: 1951-02-08  [facility]) CM/SW Contact:    Nathanael CHRISTELLA Ring, RN Phone Number: 02/15/2024, 2:55 PM  Clinical Narrative:                 CM met with patient and daughter at the bedside, introduced self and explained role in DC planning.  Explained that the doctor wants to put on dressings on his legs that will go up to his knees and be changed once a week by home health to reduce the swelling and redness.  He was not happy about this because he can't shower.  He is overall very grumpy.  Daughter is very pleasant and says that this is how he is.  She reports that he was just discharged from [facility] on 12/2- he did well there and is back home, walking with a RW.  Lives alone.  She says that he has not started with [facility] but they have been calling her, it is [facility].  She says they actually called her this morning she just needs to call them back.  Referral for [facility] RN sent to [facility] and they accepted.   Hopefully the plan is for Wound nurse to come and place the Unna boots in the ED and then [facility] to change weekly.  If Wound nurse cannot come down to put them on then maybe [facility] can start tomorrow to place the Unna boots.    Expected Discharge Plan: Home w Home Health Services Barriers to Discharge: Continued Medical Work up   Patient Goals and CMS Choice   CMS Medicare.gov Compare Post Acute Care list provided to:: Patient Choice offered to / list presented to : Patient      Expected Discharge Plan and Services   Discharge Planning Services: CM Consult Post Acute Care Choice: Home Health Living arrangements for the past 2 months: Single Family Home                 DME Arranged: N/A         HH Arranged: RN HH Agency: [facility] ([facility]) Date HH Agency Contacted: 02/15/24 Time HH Agency Contacted: 1451 Representative spoke with  at [facility] Agency: [place]  Prior Living Arrangements/Services Living arrangements for the past 2 months: Single Family Home Lives with:: Self Patient language and need for interpreter reviewed:: Yes Do you feel safe going back to the place where you live?: Yes      Need for Family Participation in Patient Care: Yes (Comment) Care giver support system in place?: Yes (comment) Current home services: DME (RW, shower transfer bench, grab bars in shower) Criminal Activity/Legal Involvement Pertinent to Current Situation/Hospitalization: No - Comment as needed  Activities of Daily Living      Permission Sought/Granted Permission sought to share information with : Family Supports Permission granted to share information with : Yes, Verbal Permission Granted  Share Information with NAME: [name] Thaxton  Permission granted to share info w AGENCY: Home Health  Permission granted to share info w Relationship: daughter  Permission granted to share info w Contact Information: [phone]  Emotional Assessment Appearance:: Appears stated age Attitude/Demeanor/Rapport: Complaining, Angry Affect (typically observed): Angry, Defensive, Frustrated, Irritable Orientation: : Oriented to Self, Oriented to Place, Oriented to  Time, Oriented to Situation Alcohol / Substance Use: Not Applicable Psych Involvement: No (comment)  Admission diagnosis:  Foot Swellng Patient  Active Problem List   Diagnosis Date Noted   Orthostatic hypotension 01/18/2024   Diarrhea 01/18/2024   PAD (peripheral artery disease) 01/14/2024   Gangrene (HCC) 01/13/2024   Stricture of esophagus 01/12/2024   Esophageal dysphagia 01/11/2024   Cellulitis of left lower extremity 01/08/2024   Anasarca 01/07/2024   Acquired achalasia of esophagus - s/p esophageal dilatation 07-14-2023 07/15/2023   DNR (do not resuscitate)/DNI(Do NOT Intubate) 07/15/2023   Hypophosphatemia 07/12/2023   Hypomagnesemia 07/12/2023   Weakness 12/13/2022    Serum total bilirubin elevated 12/13/2022   Benign paraspinal mass 11/20/2021   GERD without esophagitis 11/16/2021   Hypokalemia 11/16/2021   Hyponatremia 11/16/2021   AKI (acute kidney injury) 11/15/2021   Stage 3a chronic kidney disease (HCC) 11/07/2021   Protrusion of cervical intervertebral disc 07/23/2021   Exertional dyspnea 07/04/2021   History of alcohol abuse 07/04/2021   History of tobacco abuse 07/04/2021   Chronic diastolic CHF (congestive heart failure) (HCC) 06/26/2021   Headache 06/26/2021   Low serum albumin 05/10/2021   Protein-calorie malnutrition, moderate 12/28/2020   Esophageal dysmotility 12/28/2020   Generalized weakness 12/26/2020   Overweight (BMI 25.0-29.9) 12/20/2019   Neuropathy 04/04/2019   Pre-ulcerative calluses 10/07/2018   Skin lesion 10/07/2018   Plantar flexed metatarsal bone of right foot 10/07/2018   Hiatal hernia    Alcohol abuse 03/06/2017   Alcohol dependence in sustained full remission (HCC) 03/05/2017   Essential hypertension 11/15/2015   Vitamin D  deficiency 11/15/2015   PCP:  Alla Amis, MD Pharmacy:   [facility] - [place], [place] - [address] [place] [address] Phone: [phone] Fax: [phone]  [facility] [address] [place] - [address] [place] [address] Phone: [phone] Fax: [phone]     Social Drivers of Health (SDOH) Social History: SDOH Screenings   Food Insecurity: No Food Insecurity (01/07/2024)  Housing: Low Risk  (01/07/2024)  Transportation Needs: No Transportation Needs (01/07/2024)  Utilities: Not At Risk (01/07/2024)  Depression (PHQ2-9): Low Risk  (12/12/2021)  Financial Resource Strain: Low Risk  (05/07/2023)   Received from [facility]  Social Connections: Socially Isolated (01/07/2024)  Tobacco Use: Medium Risk (02/15/2024)   SDOH Interventions:     Readmission Risk Interventions    07/13/2023    12:44 PM  Readmission Risk Prevention Plan  Transportation Screening Complete  PCP or Specialist Appt within 5-7 Days Complete  Medication Review (RN CM) Complete

## 2024-02-15 NOTE — Consult Note (Signed)
 Hospital Consult    Reason for Consult:  Right Foot Swelling Requesting Physician:  Dr Ronal Lewandowsky MD  MRN #:  [id number]  History of Present Illness: This is a 73 y.o. male who presents to [facility] emergency department with concerns for bilateral lower extremity swelling right foot greater than anywhere else.  Patient had surgery on 01/15/2024 to his right foot to remove 2 toes by Dr. Eva Gay from podiatry.  On 01/14/2024 the patient was rerevascularized by Dr. Selinda Gu MD with stents placed to his right SFA.  He was discharged on 02/09/2024 to rehab.  He spent the last 3 weeks undergoing intensive rehab.  Patient's daughter at the bedside said that his legs were much less swollen and so was his foot as of the last week.  He followed up with Dr. Eva Gay in podiatry on 02/09/2024 and his feet were normal at that time.  Patient then was discharged to home and has been home for 3 days now.  It appears that he has not been elevating his feet at home he has been keeping his feet dependent and therefore his swelling to his bilateral lower extremities is now +3.  He complains of pain to bilateral lower extremities due to the swelling.  He has a long history of 5 to 10 years of bilateral lower extremity lymphedema and vascular insufficiency as well.  When he was seen in the hospital back on 01/15/2024 his legs looked exactly the same as he did today due to his persistent nonuse of any compression to his lower extremities and his persistence in keeping his legs dependent.  Vascular surgery was consulted to evaluate.  Past Medical History:  Diagnosis Date   (HFpEF) heart failure with preserved ejection fraction (HCC)    a.) TTE 04/11/2021: EF 60-65%, no RWMAs, G1DD, mild LAE, RVSF norm, asc Ao 38 mm; b.) TTE 11/18/2021: EF 60-65%, no RWMAs, mild LVH, G1DD, norm RVSH; c.) TEE 11/20/2021: EF 60-65%, no RWMAs, no LAA thrombus, triv MR, no valvular vegetation, no IAS; d.) TTE 11/24/2022: EF 60-65%, no RWMAs,  mod LVH, norm RVSF   Alcohol abuse, in remission    Aortic atherosclerosis    Bacteremia due to Gram-negative bacteria (Klebsiella oxytoca)    a.) source unidentified; b.) admitted to [facility] 11/20/2021 - 12/28/2021   Bilateral lower extremity edema    BPH (benign prostatic hyperplasia)    Cholelithiasis    Chronic hiccups    CKD (chronic kidney disease), stage III (HCC)    DOE (dyspnea on exertion)    Esophageal dysmotility    GERD (gastroesophageal reflux disease)    Hemorrhoids    Hepatic cyst    History of bilateral cataract extraction 2024   History of hiatal hernia    History of MSSA bacteremia secondary to foot wounds, s/p treatment 9/13 - 12/28/21 02/18/2022   Hypertension    Mediastinal mass    Memory changes    Metabolic encephalopathy    a.) in the setting of bacteremia/sepsis   MSSA bacteremia 11/2021   a.) source was foot wounds and probable septic arthritis in the lumbar spine   Nephrolithiasis    Neuropathy    Non-traumatic rhabdomyolysis    Paraspinal mass    Renal cyst, right    Sigmoid diverticulosis     Past Surgical History:  Procedure Laterality Date   AMPUTATION TOE Right 01/15/2024   Procedure: AMPUTATION, TOE;  Surgeon: Gay Eva, DPM;  Location: [facility] ORS;  Service: Orthopedics/Podiatry;  Laterality: Right;  BALLOON DILATION  01/12/2024   Procedure: BALLOON DILATION;  Surgeon: Jinny Carmine, MD;  Location: [facility] ENDOSCOPY;  Service: Endoscopy;;   BRONCHIAL WASHINGS N/A 02/16/2023   Procedure: BRONCHIAL WASHINGS;  Surgeon: Parris Manna, MD;  Location: [facility] ORS;  Service: Thoracic;  Laterality: N/A;   CATARACT EXTRACTION W/PHACO Left 11/13/2022   Procedure: CATARACT EXTRACTION PHACO AND INTRAOCULAR LENS PLACEMENT (IOC) LEFT OMIDRIA    13.58  01:23.3;  Surgeon: Enola Feliciano Hugger, MD;  Location: [facility];  Service: Ophthalmology;  Laterality: Left;   CATARACT EXTRACTION W/PHACO Right 01/08/2023   Procedure: CATARACT EXTRACTION PHACO AND  INTRAOCULAR LENS PLACEMENT (IOC) RIGHT OMIDRIA  14.23 01:16.5;  Surgeon: Enola Feliciano Hugger, MD;  Location: [facility];  Service: Ophthalmology;  Laterality: Right;   ESOPHAGOGASTRODUODENOSCOPY N/A 07/14/2023   Procedure: EGD (ESOPHAGOGASTRODUODENOSCOPY);  Surgeon: Maryruth Ole DASEN, MD;  Location: [facility] ENDOSCOPY;  Service: Endoscopy;  Laterality: N/A;   ESOPHAGOGASTRODUODENOSCOPY N/A 01/12/2024   Procedure: EGD (ESOPHAGOGASTRODUODENOSCOPY);  Surgeon: Jinny Carmine, MD;  Location: [facility] ENDOSCOPY;  Service: Endoscopy;  Laterality: N/A;   ESOPHAGOGASTRODUODENOSCOPY (EGD) WITH PROPOFOL  N/A 05/26/2018   Procedure: ESOPHAGOGASTRODUODENOSCOPY (EGD) WITH PROPOFOL ;  Surgeon: Janalyn Keene NOVAK, MD;  Location: [facility] ENDOSCOPY;  Service: Endoscopy;  Laterality: N/A;   ESOPHAGOGASTRODUODENOSCOPY (EGD) WITH PROPOFOL  N/A 12/28/2020   Procedure: ESOPHAGOGASTRODUODENOSCOPY (EGD) WITH PROPOFOL ;  Surgeon: Onita Elspeth Sharper, DO;  Location: [facility] ENDOSCOPY;  Service: Gastroenterology;  Laterality: N/A;   ESOPHAGOGASTRODUODENOSCOPY (EGD) WITH PROPOFOL  N/A 10/09/2021   Procedure: ESOPHAGOGASTRODUODENOSCOPY (EGD) WITH PROPOFOL ;  Surgeon: Therisa Bi, MD;  Location: [facility] ENDOSCOPY;  Service: Gastroenterology;  Laterality: N/A;   FLEXIBLE BRONCHOSCOPY N/A 02/16/2023   Procedure: FLEXIBLE BRONCHOSCOPY;  Surgeon: Parris Manna, MD;  Location: [facility] ORS;  Service: Thoracic;  Laterality: N/A;   INCISION AND DRAINAGE PERIRECTAL ABSCESS N/A 11/19/2015   Procedure: IRRIGATION AND DEBRIDEMENT PERIRECTAL ABSCESS and debridement of scrotal abscess;  Surgeon: Charlie FORBES Fell, MD;  Location: [facility] ORS;  Service: General;  Laterality: N/A;   INCISION AND DRAINAGE PERIRECTAL ABSCESS N/A 11/20/2015   Procedure: IRRIGATION AND DEBRIDEMENT PERIRECTAL ABSCESS / WITH DRESSING CHANGE;  Surgeon: Charlie FORBES Fell, MD;  Location: [facility] ORS;  Service: General;  Laterality: N/A;  peri rectal site   INCISION AND DRAINAGE PERIRECTAL ABSCESS  N/A 11/21/2015   Procedure: IRRIGATION AND DEBRIDEMENT PERIRECTAL ABSCESS WITH DRESSING CHANGE;  Surgeon: Charlie FORBES Fell, MD;  Location: [facility] ORS;  Service: General;  Laterality: N/A;   LOWER EXTREMITY ANGIOGRAPHY Right 01/11/2024   Procedure: Lower Extremity Angiography;  Surgeon: Marea Selinda RAMAN, MD;  Location: [facility] INVASIVE CV LAB;  Service: Cardiovascular;  Laterality: Right;   LOWER EXTREMITY ANGIOGRAPHY Right 01/14/2024   Procedure: Lower Extremity Angiography;  Surgeon: Marea Selinda RAMAN, MD;  Location: [facility] INV[facility] CV LAB;  Service: Cardiovascular;  Laterality: Right;   MINOR HEMORRHOIDECTOMY  1989   TEE WITHOUT CARDIOVERSION N/A 11/20/2021   Procedure: TRANSESOPHAGEAL ECHOCARDIOGRAM (TEE);  Surgeon: Hester Wolm PARAS, MD;  Location: [facility] ORS;  Service: Cardiovascular;  Laterality: N/A;  8:00am    Allergies  Allergen Reactions   Adalat [Nifedipine]     Foot swelling.    Amlodipine  Other (See Comments)    Leg swelling    Prior to Admission medications   Medication Sig Start Date End Date Taking? Authorizing Provider  acetaminophen  (TYLENOL ) 325 MG tablet Take 2 tablets (650 mg total) by mouth every 6 (six) hours as needed for mild pain (pain score 1-3) (or Fever >/= 101). 01/21/24   Jerelene Critchley, MD  aspirin  EC 81  MG tablet Take 1 tablet (81 mg total) by mouth daily. Swallow whole. 01/22/24   Jerelene Critchley, MD  atorvastatin  (LIPITOR) 10 MG tablet Take 1 tablet (10 mg total) by mouth daily. 01/22/24   Jerelene Critchley, MD  clopidogrel  (PLAVIX ) 75 MG tablet Take 1 tablet (75 mg total) by mouth daily. 01/22/24   Jerelene Critchley, MD  furosemide  (LASIX ) 20 MG tablet Take 2 tablets (40 mg total) by mouth daily. 01/21/24   Jerelene Critchley, MD  loperamide  (IMODIUM ) 2 MG capsule Take 2 capsules (4 mg total) by mouth every 6 (six) hours as needed for diarrhea or loose stools. 01/21/24   Jerelene Critchley, MD  losartan  (COZAAR ) 100 MG tablet Take 1 tablet (100 mg total) by mouth daily. 05/13/21    Karamalegos, Marsa PARAS, DO  metoprolol  succinate (TOPROL -XL) 25 MG 24 hr tablet Take 1 tablet (25 mg total) by mouth daily. 11/27/22   Awanda City, MD  ondansetron  (ZOFRAN ) 4 MG tablet Take 1 tablet (4 mg total) by mouth every 8 (eight) hours as needed for nausea or vomiting. 10/01/22   Therisa Bi, MD  ondansetron  (ZOFRAN ) 4 MG tablet Take 1 tablet (4 mg total) by mouth every 6 (six) hours as needed for nausea, refractory nausea / vomiting or vomiting. 07/15/23   Laurence Locus, DO  pantoprazole  (PROTONIX ) 40 MG tablet Take 1 tablet (40 mg total) by mouth 2 (two) times daily. Patient not taking: Reported on 01/07/2024 07/15/23 08/14/23  Laurence Locus, DO  potassium chloride  SA (KLOR-CON  M) 20 MEQ tablet Take 20 mEq by mouth daily. 08/17/23   [provider]  prochlorperazine  (COMPAZINE ) 10 MG tablet Take 1 tablet (10 mg total) by mouth every 8 (eight) hours as needed for nausea or vomiting. Patient not taking: Reported on 01/07/2024 07/15/23   Laurence Locus, DO  thiamine  (VITAMIN B-1) 100 MG tablet Take 1 tablet (100 mg total) by mouth daily. Patient not taking: Reported on 01/07/2024 07/16/23   Laurence Locus, DO  Vitamin D , Ergocalciferol , (DRISDOL ) 1.25 MG (50000 UNIT) CAPS capsule Take 1 capsule (50,000 Units total) by mouth every 7 (seven) days for 6 doses. 01/23/24 02/28/24  Jerelene Critchley, MD    Social History   Socioeconomic History   Marital status: Legally Separated    Spouse name: Not on file   Number of children: Not on file   Years of education: Not on file   Highest education level: Not on file  Occupational History   Not on file  Tobacco Use   Smoking status: Former    Current packs/day: 0.00    Average packs/day: 0.5 packs/day for 38.0 years (19.0 ttl pk-yrs)    Types: Cigarettes    Start date: 04/10/1984    Quit date: 04/10/2022    Years since quitting: 1.8   Smokeless tobacco: Former  Building Services Engineer status: Never Used  Substance and Sexual Activity   Alcohol use: Not Currently     Comment: several months,[place]   Drug use: No   Sexual activity: Not Currently  Other Topics Concern   Not on file  Social History Narrative   Not on file   Social Drivers of Health   Financial Resource Strain: Low Risk  (05/07/2023)   Received from [facility]   Overall Financial Resource Strain (CARDIA)    Difficulty of Paying Living Expenses: Not hard at all  Food Insecurity: No Food Insecurity (01/07/2024)   Hunger Vital Sign    Worried About Running Out of  Food in the Last Year: Never true    Ran Out of Food in the Last Year: Never true  Transportation Needs: No Transportation Needs (01/07/2024)   PRAPARE - Administrator, Civil Service (Medical): No    Lack of Transportation (Non-Medical): No  Physical Activity: Not on file  Stress: Not on file  Social Connections: Socially Isolated (01/07/2024)   Social Connection and Isolation Panel    Frequency of Communication with Friends and Family: More than three times a week    Frequency of Social Gatherings with Friends and Family: Twice a week    Attends Religious Services: Never    Database Administrator or Organizations: No    Attends Banker Meetings: Never    Marital Status: Divorced  Catering Manager Violence: Not At Risk (01/07/2024)   Humiliation, Afraid, Rape, and Kick questionnaire    Fear of Current or Ex-Partner: No    Emotionally Abused: No    Physically Abused: No    Sexually Abused: No     Family History  Problem Relation Age of Onset   Hypertension Father     ROS: Otherwise negative unless mentioned in HPI  Physical Examination  Vitals:   02/15/24 1043 02/15/24 1140  BP: (!) 137/98   Pulse: 85 77  Resp: 18   Temp: 98.1 F (36.7 C)   SpO2:  100%   Body mass index is 20.64 kg/m.  General:  WDWN in NAD Gait: Not observed HENT: WNL, normocephalic Pulmonary: normal non-labored breathing, without Rales, rhonchi,  wheezing Cardiac: regular, without   Murmurs, rubs or gallops; without carotid bruits Abdomen: Positive bowel sounds throughout, soft, NT/ND, no masses Skin: without rashes Vascular Exam/Pulses: Bilateral upper extremities warm to touch with palpable pulses.  Bilateral lower extremities with +3 edema and swelling.  DP and PT pulses by Doppler only. Extremities: without ischemic changes, without Gangrene , with cellulitis; without open wounds;  Musculoskeletal: no muscle wasting or atrophy  Neurologic: A&O X 3;  No focal weakness or paresthesias are detected; speech is fluent/normal Psychiatric:  The pt has Normal affect. Lymph:  Unremarkable  CBC    Component Value Date/Time   WBC 6.1 02/15/2024 1130   RBC 3.56 (L) 02/15/2024 1130   HGB 11.0 (L) 02/15/2024 1130   HGB 15.7 04/15/2012 2049   HCT 34.2 (L) 02/15/2024 1130   HCT 47.7 04/15/2012 2049   PLT 230 02/15/2024 1130   PLT 175 04/15/2012 2049   MCV 96.1 02/15/2024 1130   MCV 92 04/15/2012 2049   MCH 30.9 02/15/2024 1130   MCHC 32.2 02/15/2024 1130   RDW 11.9 02/15/2024 1130   RDW 13.9 04/15/2012 2049   LYMPHSABS 0.5 (L) 01/07/2024 1043   MONOABS 0.4 01/07/2024 1043   EOSABS 0.0 01/07/2024 1043   BASOSABS 0.0 01/07/2024 1043    BMET    Component Value Date/Time   NA 141 02/15/2024 1130   NA 135 (L) 04/15/2012 2049   K 4.5 02/15/2024 1130   K 3.5 04/15/2012 2049   CL 107 02/15/2024 1130   CL 98 04/15/2012 2049   CO2 26 02/15/2024 1130   CO2 30 04/15/2012 2049   GLUCOSE 76 02/15/2024 1130   GLUCOSE 107 (H) 04/15/2012 2049   BUN 12 02/15/2024 1130   BUN 17 04/15/2012 2049   CREATININE 0.95 02/15/2024 1130   CREATININE 1.15 12/20/2020 0946   CALCIUM  9.7 02/15/2024 1130   CALCIUM  8.5 04/15/2012 2049   GFRNONAA >60 02/15/2024 1130  GFRNONAA 73 12/20/2019 0916   GFRAA 85 12/20/2019 0916    COAGS: Lab Results  Component Value Date   INR 0.9 02/15/2024   INR 1.1 12/13/2022   INR 1.1 11/23/2022     Non-Invasive Vascular Imaging:    EXAM: BILATERAL LOWER EXTREMITY VENOUS DOPPLER ULTRASOUND   TECHNIQUE: Gray-scale sonography with graded compression, as well as color Doppler and duplex ultrasound were performed to evaluate the lower extremity deep venous systems from the level of the common femoral vein and including the common femoral, femoral, profunda femoral, popliteal and calf veins including the posterior tibial, peroneal and gastrocnemius veins when visible. Spectral Doppler was utilized to evaluate flow at rest and with distal augmentation maneuvers in the common femoral, femoral and popliteal veins.   COMPARISON:  None Available.   FINDINGS: RIGHT LOWER EXTREMITY   Common Femoral Vein: No evidence of thrombus. Normal compressibility, respiratory phasicity and response to augmentation.   Saphenofemoral Junction: No evidence of thrombus. Normal compressibility and flow on color Doppler imaging.   Profunda Femoral Vein: No evidence of thrombus. Normal compressibility and flow on color Doppler imaging.   Femoral Vein: No evidence of thrombus. Normal compressibility, respiratory phasicity and response to augmentation.   Popliteal Vein: No evidence of thrombus. Normal compressibility, respiratory phasicity and response to augmentation.   Calf Veins: No evidence of thrombus. Normal compressibility and flow on color Doppler imaging. Limited evaluation.   Other Findings:  None.   LEFT LOWER EXTREMITY   Common Femoral Vein: No evidence of thrombus. Normal compressibility, respiratory phasicity and response to augmentation.   Saphenofemoral Junction: No evidence of thrombus. Normal compressibility and flow on color Doppler imaging.   Profunda Femoral Vein: No evidence of thrombus. Normal compressibility and flow on color Doppler imaging.   Femoral Vein: No evidence of thrombus. Normal compressibility, respiratory phasicity and response to augmentation.   Popliteal Vein: No evidence of thrombus.  Normal compressibility, respiratory phasicity and response to augmentation.   Calf Veins: No evidence of thrombus. Normal compressibility and flow on color Doppler imaging. Limited evaluation.   Other Findings: Small fluid collection in the left popliteal fossa that measures 2.4 x 1.0 x 1.6 cm.   IMPRESSION: 1. No evidence of deep venous thrombosis in either lower extremity. 2. Small fluid collection in the left popliteal fossa. This could represent a small Baker's cyst.  EXAM: LEFT UPPER EXTREMITY VENOUS DOPPLER ULTRASOUND   TECHNIQUE: Gray-scale sonography with graded compression, as well as color Doppler and duplex ultrasound were performed to evaluate the upper extremity deep venous system from the level of the subclavian vein and including the jugular, axillary, basilic, radial, ulnar and upper cephalic vein. Spectral Doppler was utilized to evaluate flow at rest and with distal augmentation maneuvers.   COMPARISON:  None Available.   FINDINGS: Contralateral Subclavian Vein: No evidence of thrombus. Normal color Doppler flow.   Internal Jugular Vein: No evidence of thrombus. Normal compressibility, color Doppler flow and phasicity.   Subclavian Vein: No evidence of thrombus. Normal color Doppler flow and augmentation.   Axillary Vein: No evidence of thrombus. Normal compressibility, respiratory phasicity and response to augmentation.   Cephalic Vein: No evidence of thrombus. Normal compressibility, respiratory phasicity and response to augmentation.   Basilic Vein: No evidence of thrombus. Normal compressibility, respiratory phasicity and response to augmentation.   Brachial Veins: No evidence of thrombus. Normal compressibility, respiratory phasicity and response to augmentation.   Radial Veins: No evidence of thrombus. Normal compressibility and color Doppler flow.  Ulnar Veins: No evidence of thrombus. Normal compressibility and color Doppler flow.    Other Findings:  None visualized.   IMPRESSION: No evidence of DVT within the left upper extremity.  Statin:  Yes.   Beta Blocker:  Yes.   Aspirin :  Yes.   ACEI:  No. ARB:  No. CCB use:  No Other antiplatelets/anticoagulants:  Yes.   Plavix  75 mg daily   ASSESSMENT/PLAN: This is a 73 y.o. male who presents to [facility] emergency department after coming home from rehab and developing bilateral lower extremity swelling.  Patient has a chronic history of 5 to 10 years of vascular insufficiency with lymphedema to his lower extremities.  While in rehab they elevated his lower extremities every day and therefore he had no swelling.  He comes home for 2 days and sits in his recliner with his feet dependent now creating +3 edema to bilateral lower extremities.  Daughter took the patient to the emergency room because she was worried about the swelling and the fact that he could have had possible DVTs to the extremities.  Patient underwent bilateral lower extremity venous Doppler ultrasounds today that were negative for DVT.  He also underwent a left upper extremity venous Doppler ultrasound for DVT and that was negative as well.  This is all related to him being noncompliant with any type of conventional therapies such as compression socks which he was asked to wear leaving rehab and elevating his feet at all times to prevent the edema he now has.  Therefore I do not recommend that he be admitted at this time.  I personally wrapped bilateral lower extremities with Unna boots.  I spoke with CSW handling emergency department and they have set him up with a home health company to change his Unna boots once a week every Monday for the next 4 weeks.  He will follow-up with vein and vascular surgery in clinic on week 5 for us  to evaluate his lower extremity swelling.  He does not need any antibiotics today.  Patient to be discharged home from the emergency department.  Dr. Hobert MD from the emergency department has  given him a small amount of pain medication to help for the next 3 days.   -I discussed the case in detail with Dr. Selinda Gu MD and he agrees with the plan.   Gwendlyn JONELLE Shank [facility] 02/15/2024 1:53 PM

## 2024-02-15 NOTE — ED Triage Notes (Signed)
 Pt comes in via pov with complaints of right foot swelling pain. Pt had surgery 11/6 for the removed 2 toes. Pt had a re-vascularization on 11/14. Pt discharged 12/2, and noticed the swelling last Wednesday. Pt with noticeable swelling and weeping to the right leg. Pt complains of pain 10/10 at this time. Pt also complains of dizziness at this time. Pt is on lasix , but hasn't had it today.

## 2024-02-15 NOTE — TOC Transition Note (Signed)
 Transition of Care [facility]) - Discharge Note   Patient Details  Name: John Booker MRN: [id number] Date of Birth: 1951/02/04  [facility] CM/SW Contact:  Nathanael CHRISTELLA Ring, RN Phone Number: 02/15/2024, 3:23 PM   Clinical Narrative:     Redell Shank NP placed the bilateral Nonie Boots himself.  He said that he needs the home health agency to change them next Monday if the home health agency does not show or cannot go out on Monday then patient needs to go to the Vascular Office to have them changed.  Daughter is aware of this.  She also has the number for [facility].  Leita with [facility] notified that Unna boots were put on today here in the ED and need to be changed by [facility] on Monday 12/15, she verbalizes understanding.   Final next level of care: Home w Home Health Services Barriers to Discharge: Barriers Resolved   Patient Goals and CMS Choice   CMS Medicare.gov Compare Post Acute Care list provided to:: Patient Choice offered to / list presented to : Patient      Discharge Placement                       Discharge Plan and Services Additional resources added to the After Visit Summary for     Discharge Planning Services: CM Consult Post Acute Care Choice: Home Health          DME Arranged: N/A         HH Arranged: RN HH Agency: [facility] Date HH Agency Contacted: 02/15/24 Time HH Agency Contacted: 1451 Representative spoke with at [facility] Agency: Leita  Social Drivers of Health (SDOH) Interventions SDOH Screenings   Food Insecurity: No Food Insecurity (01/07/2024)  Housing: Low Risk  (01/07/2024)  Transportation Needs: No Transportation Needs (01/07/2024)  Utilities: Not At Risk (01/07/2024)  Depression (PHQ2-9): Low Risk  (12/12/2021)  Financial Resource Strain: Low Risk  (05/07/2023)   Received from [facility]  Social Connections: Socially Isolated (01/07/2024)  Tobacco Use: Medium Risk (02/15/2024)      Readmission Risk Interventions    07/13/2023   12:44 PM  Readmission Risk Prevention Plan  Transportation Screening Complete  PCP or Specialist Appt within 5-7 Days Complete  Medication Review (RN CM) Complete

## 2024-02-15 NOTE — ED Notes (Signed)
 Pt given DC instructions. Pt verbalized understanding of medication and follow up care. Pt taken from ED in wheelchair by this RN.

## 2024-02-15 NOTE — Discharge Instructions (Addendum)
   Take oxycodone  as prescribed. Do not drink alcohol, drive or participate in any other potentially dangerous activities while taking this medication as it may make you sleepy. Do not take this medication with any other sedating medications, either prescription or over-the-counter. If you were prescribed Percocet or Vicodin, do not take these with acetaminophen  (Tylenol ) as it is already contained within these medications.  - RETURN PRECAUTIONS & AFTERCARE: (ENGLISH) RETURN PRECAUTIONS: Return immediately to the emergency department or see/call your doctor if you feel worse, weak or have changes in speech or vision, are short of breath, have fever, vomiting, pain, bleeding or dark stool, trouble urinating or any new issues. Return here or see/call your doctor if not improving as expected for your suspected condition. FOLLOW-UP CARE: Call your doctor and/or any doctors we referred you to for more advice and to make an appointment. Do this today, tomorrow or after the weekend. Some doctors only take PPO insurance so if you have HMO insurance you may want to contact your HMO or your regular doctor for referral to a specialist within your plan. Either way tell the doctor's office that it was a referral from the emergency department so you get the soonest possible appointment.  YOUR TEST RESULTS: Take result reports of any blood or urine tests, imaging tests and EKG's to your doctor and any referral doctor. Have any abnormal tests repeated. Your doctor or a referral doctor can let you know when this should be done. Also make sure your doctor contacts this hospital to get any test results that are not currently available such as cultures or special tests for infection and final imaging reports, which are often not available at the time you leave the ER but which may list additional important findings that are not documented on the preliminary report. BLOOD PRESSURE: If your blood pressure was greater than 120/80  have your blood pressure rechecked within 1 to 2 weeks. MEDICATION SIDE EFFECTS: Do not drive, walk, bike, take the bus, etc. if you have received or are being prescribed any sedating medications such as those for pain or anxiety or certain antihistamines like Benadryl . If you have been give one of these here get a taxi home or have a friend drive you home. Ask your pharmacist to counsel you on potential side effects of any new medication   This medication is an opiate (or narcotic) pain medication and can be habit forming. Use it as little as possible to achieve adequate pain control. Do not use or use it with extreme caution if you have a history of opiate abuse or dependence. If you are on a pain contract with your primary care doctor or a pain specialist, be sure to let them know you were prescribed this medication today from the Emergency Department. This medication is intended for your use only - do not give any to anyone else and keep it in a secure place where nobody else, especially children, have access to it.

## 2024-03-15 ENCOUNTER — Encounter (INDEPENDENT_AMBULATORY_CARE_PROVIDER_SITE_OTHER): Payer: Self-pay

## 2024-03-24 ENCOUNTER — Other Ambulatory Visit (INDEPENDENT_AMBULATORY_CARE_PROVIDER_SITE_OTHER): Payer: Self-pay | Admitting: Vascular Surgery

## 2024-03-24 DIAGNOSIS — I739 Peripheral vascular disease, unspecified: Secondary | ICD-10-CM

## 2024-03-28 ENCOUNTER — Ambulatory Visit (INDEPENDENT_AMBULATORY_CARE_PROVIDER_SITE_OTHER): Admitting: Nurse Practitioner

## 2024-03-28 ENCOUNTER — Ambulatory Visit (INDEPENDENT_AMBULATORY_CARE_PROVIDER_SITE_OTHER)

## 2024-03-28 ENCOUNTER — Encounter (INDEPENDENT_AMBULATORY_CARE_PROVIDER_SITE_OTHER): Payer: Self-pay | Admitting: Nurse Practitioner

## 2024-03-28 VITALS — BP 188/94 | HR 79 | Resp 18 | Ht 75.0 in | Wt 197.2 lb

## 2024-03-28 DIAGNOSIS — I739 Peripheral vascular disease, unspecified: Secondary | ICD-10-CM

## 2024-03-28 DIAGNOSIS — I10 Essential (primary) hypertension: Secondary | ICD-10-CM

## 2024-03-28 DIAGNOSIS — Z9889 Other specified postprocedural states: Secondary | ICD-10-CM | POA: Diagnosis not present

## 2024-03-28 DIAGNOSIS — I890 Lymphedema, not elsewhere classified: Secondary | ICD-10-CM

## 2024-03-31 LAB — VAS US ABI WITH/WO TBI
Left ABI: 1.01
Right ABI: 1

## 2024-04-01 ENCOUNTER — Encounter (INDEPENDENT_AMBULATORY_CARE_PROVIDER_SITE_OTHER): Payer: Self-pay | Admitting: Vascular Surgery

## 2024-04-01 NOTE — Telephone Encounter (Signed)
 Closing encounter

## 2024-04-02 ENCOUNTER — Encounter (INDEPENDENT_AMBULATORY_CARE_PROVIDER_SITE_OTHER): Payer: Self-pay | Admitting: Nurse Practitioner

## 2024-04-02 NOTE — Progress Notes (Signed)
 "  Subjective:    Patient ID: John Booker, male    DOB: 10-Jan-1951, 74 y.o.   MRN: [id number] Chief Complaint  Patient presents with   Follow-up    Follow up 5-6 week post op + ABI + LEA    HPI  Discussed the use of AI scribe software for clinical note transcription with the patient, who gave verbal consent to proceed.  History of Present Illness John Booker is a 74 year old male with chronic lower extremity edema, prior right lower extremity revascularization, and partial foot amputation who presents with persistent bilateral lower extremity swelling and pain.  He has longstanding bilateral lower extremity edema, which acutely worsened prior to a recent hospitalization, accompanied by new-onset serous drainage from both legs. Since that episode, he has experienced persistent, significant bilateral lower extremity swelling and pain, localized below the knees and not limited to the right (surgical) side. He describes the pain as mild but constant, with associated chronic erythema and burning or stinging sensations, which are present most of the time but improved compared to prior episodes.  He has utilized [employer] and compression socks since February 15, 2024, applied twice weekly by a home health nurse, without significant reduction in edema. He elevates his legs when sitting. A home health nurse has recommended evaluation for a lymphedema pump or referral to a lymphedema clinic due to lack of response to current compression therapies. He is also taking furosemide , which increases urination but does not reduce the swelling.  He underwent right lower extremity revascularization with stent placement and partial foot amputation prior to December 2025. The amputation site was fully healed as of his last podiatry visit in December, and he was discharged from podiatry care at that time.    Results Diagnostic Ankle-brachial index, right (03/28/2024): 1.00 Ankle-brachial index,  left (03/28/2024): 1.01 Toe-brachial index, right (03/28/2024): 1.09 Toe-brachial index, left (03/28/2024): 0.83  Pathology Lower extremity amputation site (02/2024): Healed without complication   Review of Systems     Objective:   Physical Exam  Physical Exam EXTREMITIES: Pitting edema present in lower extremities, swelling present below the knees.  BP (!) 188/94 (BP Location: Right Arm, Patient Position: Sitting, Cuff Size: Normal) Comment: pt did not take BP meds today due to causing frequent urination, will take when he gets home  Pulse 79   Resp 18   Ht 6' 3 (1.905 m)   Wt 197 lb 3.2 oz (89.4 kg)   BMI 24.65 kg/m   Past Medical History:  Diagnosis Date   (HFpEF) heart failure with preserved ejection fraction (HCC)    a.) TTE 04/11/2021: EF 60-65%, no RWMAs, G1DD, mild LAE, RVSF norm, asc Ao 38 mm; b.) TTE 11/18/2021: EF 60-65%, no RWMAs, mild LVH, G1DD, norm RVSH; c.) TEE 11/20/2021: EF 60-65%, no RWMAs, no LAA thrombus, triv MR, no valvular vegetation, no IAS; d.) TTE 11/24/2022: EF 60-65%, no RWMAs, mod LVH, norm RVSF   Alcohol abuse, in remission    Aortic atherosclerosis    Bacteremia due to Gram-negative bacteria (Klebsiella oxytoca)    a.) source unidentified; b.) admitted to [facility] 11/20/2021 - 12/28/2021   Bilateral lower extremity edema    BPH (benign prostatic hyperplasia)    Cholelithiasis    Chronic hiccups    CKD (chronic kidney disease), stage III (HCC)    DOE (dyspnea on exertion)    Esophageal dysmotility    GERD (gastroesophageal reflux disease)    Hemorrhoids    Hepatic  cyst    History of bilateral cataract extraction 2024   History of hiatal hernia    History of MSSA bacteremia secondary to foot wounds, s/p treatment 9/13 - 12/28/21 02/18/2022   Hypertension    Mediastinal mass    Memory changes    Metabolic encephalopathy    a.) in the setting of bacteremia/sepsis   MSSA bacteremia 11/2021   a.) source was foot wounds and probable septic  arthritis in the lumbar spine   Nephrolithiasis    Neuropathy    Non-traumatic rhabdomyolysis    Paraspinal mass    Renal cyst, right    Sigmoid diverticulosis     Social History   Socioeconomic History   Marital status: Legally Separated    Spouse name: Not on file   Number of children: Not on file   Years of education: Not on file   Highest education level: Not on file  Occupational History   Not on file  Tobacco Use   Smoking status: Former    Current packs/day: 0.00    Average packs/day: 0.5 packs/day for 38.0 years (19.0 ttl pk-yrs)    Types: Cigarettes    Start date: 04/10/1984    Quit date: 04/10/2022    Years since quitting: 1.9   Smokeless tobacco: Former  Building Services Engineer status: Never Used  Substance and Sexual Activity   Alcohol use: Not Currently    Comment: several months,[place]   Drug use: No   Sexual activity: Not Currently  Other Topics Concern   Not on file  Social History Narrative   Not on file   Social Drivers of Health   Tobacco Use: Medium Risk (04/02/2024)   Patient History    Smoking Tobacco Use: Former    Smokeless Tobacco Use: Former    Passive Exposure: Not on Actuary Strain: Low Risk  (05/07/2023)   Received from [facility]   Overall Financial Resource Strain (CARDIA)    Difficulty of Paying Living Expenses: Not hard at all  Food Insecurity: No Food Insecurity (01/07/2024)   Epic    Worried About Programme Researcher, Broadcasting/film/video in the Last Year: Never true    Ran Out of Food in the Last Year: Never true  Transportation Needs: No Transportation Needs (01/07/2024)   Epic    Lack of Transportation (Medical): No    Lack of Transportation (Non-Medical): No  Physical Activity: Not on file  Stress: Not on file  Social Connections: Socially Isolated (01/07/2024)   Social Connection and Isolation Panel    Frequency of Communication with Friends and Family: More than three times a week    Frequency of Social  Gatherings with Friends and Family: Twice a week    Attends Religious Services: Never    Database Administrator or Organizations: No    Attends Banker Meetings: Never    Marital Status: Divorced  Catering Manager Violence: Not At Risk (01/07/2024)   Epic    Fear of Current or Ex-Partner: No    Emotionally Abused: No    Physically Abused: No    Sexually Abused: No  Depression (PHQ2-9): Low Risk (12/12/2021)   Depression (PHQ2-9)    PHQ-2 Score: 0  Alcohol Screen: Not on file  Housing: Low Risk (01/07/2024)   Epic    Unable to Pay for Housing in the Last Year: No    Number of Times Moved in the Last Year: 0    Homeless in the  Last Year: No  Utilities: Not At Risk (01/07/2024)   Epic    Threatened with loss of utilities: No  Health Literacy: Not on file    Past Surgical History:  Procedure Laterality Date   AMPUTATION TOE Right 01/15/2024   Procedure: AMPUTATION, TOE;  Surgeon: Ashley Soulier, DPM;  Location: [facility];  Service: Orthopedics/Podiatry;  Laterality: Right;   BALLOON DILATION  01/12/2024   Procedure: BALLOON DILATION;  Surgeon: Jinny Carmine, MD;  Location: [facility];  Service: Endoscopy;;   BRONCHIAL WASHINGS N/A 02/16/2023   Procedure: BRONCHIAL WASHINGS;  Surgeon: Parris Manna, MD;  Location: [facility];  Service: Thoracic;  Laterality: N/A;   CATARACT EXTRACTION W/PHACO Left 11/13/2022   Procedure: CATARACT EXTRACTION PHACO AND INTRAOCULAR LENS PLACEMENT (IOC) LEFT OMIDRIA    13.58  01:23.3;  Surgeon: Enola Feliciano Hugger, MD;  Location: [facility];  Service: Ophthalmology;  Laterality: Left;   CATARACT EXTRACTION W/PHACO Right 01/08/2023   Procedure: CATARACT EXTRACTION PHACO AND INTRAOCULAR LENS PLACEMENT (IOC) RIGHT OMIDRIA  14.23 01:16.5;  Surgeon: Enola Feliciano Hugger, MD;  Location: [facility];  Service: Ophthalmology;  Laterality: Right;   ESOPHAGOGASTRODUODENOSCOPY N/A 07/14/2023   Procedure: EGD (ESOPHAGOGASTRODUODENOSCOPY);   Surgeon: Maryruth Ole DASEN, MD;  Location: [facility];  Service: Endoscopy;  Laterality: N/A;   ESOPHAGOGASTRODUODENOSCOPY N/A 01/12/2024   Procedure: EGD (ESOPHAGOGASTRODUODENOSCOPY);  Surgeon: Jinny Carmine, MD;  Location: [facility]COPY;  Service: Endoscopy;  Laterality: N/A;   ESOPHAGOGASTRODUODENOSCOPY (EGD) WITH PROPOFOL  N/A 05/26/2018   Procedure: ESOPHAGOGASTRODUODENOSCOPY (EGD) WITH PROPOFOL ;  Surgeon: Janalyn Keene NOVAK, MD;  Location: [facility];  Service: Endoscopy;  Laterality: N/A;   ESOPHAGOGASTRODUODENOSCOPY (EGD) WITH PROPOFOL  N/A 12/28/2020   Procedure: ESOPHAGOGASTRODUODENOSCOPY (EGD) WITH PROPOFOL ;  Surgeon: Onita Elspeth Sharper, DO;  Location: [facility];  Service: Gastroenterology;  Laterality: N/A;   ESOPHAGOGASTRODUODENOSCOPY (EGD) WITH PROPOFOL  N/A 10/09/2021   Procedure: ESOPHAGOGASTRODUODENOSCOPY (EGD) WITH PROPOFOL ;  Surgeon: Therisa Bi, MD;  Location: [facility] END[facility]COPY;  Service: Gastroenterology;  Laterality: N/A;   FLEXIBLE BRONCHOSCOPY N/A 02/16/2023   Procedure: FLEXIBLE BRONCHOSCOPY;  Surgeon: Parris Manna, MD;  Location: [facility];  Service: Thoracic;  Laterality: N/A;   INCISION AND DRAINAGE PERIRECTAL ABSCESS N/A 11/19/2015   Procedure: IRRIGATION AND DEBRIDEMENT PERIRECTAL ABSCESS and debridement of scrotal abscess;  Surgeon: Charlie FORBES Fell, MD;  Location: [facility];  Service: General;  Laterality: N/A;   INCISION AND DRAINAGE PERIRECTAL ABSCESS N/A 11/20/2015   Procedure: IRRIGATION AND DEBRIDEMENT PERIRECTAL ABSCESS / WITH DRESSING CHANGE;  Surgeon: Charlie FORBES Fell, MD;  Location: [facility];  Service: General;  Laterality: N/A;  peri rectal site   INCISION AND DRAINAGE PERIRECTAL ABSCESS N/A 11/21/2015   Procedure: IRRIGATION AND DEBRIDEMENT PERIRECTAL ABSCESS WITH DRESSING CHANGE;  Surgeon: Charlie FORBES Fell, MD;  Location: [facility];  Service: General;  Laterality: N/A;   LOWER EXTREMITY ANGIOGRAPHY Right 01/11/2024   Procedure: Lower Extremity  Angiography;  Surgeon: Marea Selinda RAMAN, MD;  Location: [facility];  Service: Cardiovascular;  Laterality: Right;   LOWER EXTREMITY ANGIOGRAPHY Right 01/14/2024   Procedure: Lower Extremity Angiography;  Surgeon: Marea Selinda RAMAN, MD;  Location: [facility];  Service: Cardiovascular;  Laterality: Right;   MINOR HEMORRHOIDECTOMY  1989   TEE WITHOUT CARDIOVERSION N/A 11/20/2021   Procedure: TRANSESOPHAGEAL ECHOCARDIOGRAM (TEE);  Surgeon: Hester Wolm PARAS, MD;  Location: [facility];  Service: Cardiovascular;  Laterality: N/A;  8:00am    Family History  Problem Relation Age of Onset   Hypertension Father     Allergies[1]  Latest Ref Rng & Units 02/15/2024   11:30 AM 01/20/2024    3:53 AM 01/19/2024    4:10 AM  CBC  WBC 4.0 - 10.5 K/uL 6.1  6.5  6.5   Hemoglobin 13.0 - 17.0 g/dL 88.9  9.3  89.9   Hematocrit 39.0 - 52.0 % 34.2  29.1  31.7   Platelets 150 - 400 K/uL 230  299  293       CMP     Component Value Date/Time   NA 141 02/15/2024 1130   NA 135 (L) 04/15/2012 2049   K 4.5 02/15/2024 1130   K 3.5 04/15/2012 2049   CL 107 02/15/2024 1130   CL 98 04/15/2012 2049   CO2 26 02/15/2024 1130   CO2 30 04/15/2012 2049   GLUCOSE 76 02/15/2024 1130   GLUCOSE 107 (H) 04/15/2012 2049   BUN 12 02/15/2024 1130   BUN 17 04/15/2012 2049   CREATININE 0.95 02/15/2024 1130   CREATININE 1.15 12/20/2020 0946   CALCIUM  9.7 02/15/2024 1130   CALCIUM  8.5 04/15/2012 2049   PROT 5.7 (L) 01/07/2024 1043   PROT 7.3 04/15/2012 2049   ALBUMIN 2.5 (L) 01/07/2024 1043   ALBUMIN 3.4 04/15/2012 2049   AST 22 01/07/2024 1043   AST 72 (H) 04/15/2012 2049   ALT 16 01/07/2024 1043   ALT 63 04/15/2012 2049   ALKPHOS 79 01/07/2024 1043   ALKPHOS 88 04/15/2012 2049   BILITOT 1.0 01/07/2024 1043   BILITOT 0.8 04/15/2012 2049   EGFR 68 12/20/2020 0946   GFRNONAA >60 02/15/2024 1130   GFRNONAA 73 12/20/2019 0916     VAS US  ABI WITH/WO TBI Result Date: 03/31/2024  LOWER EXTREMITY  DOPPLER STUDY Patient Name:  EZEKIAL ARNS  Date of Exam:   03/28/2024 Medical Rec #: [id number]       Accession #:    [id number] Date of Birth: 02-Aug-1950       Patient Gender: M Patient Age:   22 years Exam Location:  [facility] Procedure:      VAS US  ABI WITH/WO TBI Referring Phys: Selinda Gu --------------------------------------------------------------------------------  Indications: Peripheral artery disease.  Vascular Interventions: 01/11/2024: Aortogram and Selective Right Lower                         Extremity Angiogram.                          01/14/2024: Rt LE Vaibahn stent and PTA's. Performing Technologist: Leafy Gibes RVS  Examination Guidelines: A complete evaluation includes at minimum, Doppler waveform signals and systolic blood pressure reading at the level of bilateral brachial, anterior tibial, and posterior tibial arteries, when vessel segments are accessible. Bilateral testing is considered an integral part of a complete examination. Photoelectric Plethysmograph (PPG) waveforms and toe systolic pressure readings are included as required and additional duplex testing as needed. Limited examinations for reoccurring indications may be performed as noted.  ABI Findings: +---------+------------------+-----+--------+--------+ Right    Rt Pressure (mmHg)IndexWaveformComment  +---------+------------------+-----+--------+--------+ Brachial 186                                     +---------+------------------+-----+--------+--------+ ATA      186               1.00 biphasic         +---------+------------------+-----+--------+--------+  PTA      170               0.91 biphasic         +---------+------------------+-----+--------+--------+ Great Toe203               1.09 Normal           +---------+------------------+-----+--------+--------+ +---------+------------------+-----+--------+-------+ Left     Lt Pressure (mmHg)IndexWaveformComment  +---------+------------------+-----+--------+-------+ Brachial 184                                    +---------+------------------+-----+--------+-------+ ATA      186               1.00 biphasic        +---------+------------------+-----+--------+-------+ PTA      187               1.01 biphasic        +---------+------------------+-----+--------+-------+ Great Toe155               0.83 Normal          +---------+------------------+-----+--------+-------+ +-------+-----------+-----------+------------+------------+ ABI/TBIToday's ABIToday's TBIPrevious ABIPrevious TBI +-------+-----------+-----------+------------+------------+ Right  1.00       1.09                                +-------+-----------+-----------+------------+------------+ Left   1.01       .83                                 +-------+-----------+-----------+------------+------------+  Summary: Right: Resting right ankle-brachial index is within normal range. The right toe-brachial index is normal.  Left: Resting left ankle-brachial index is within normal range. The left toe-brachial index is normal.  *See table(s) above for measurements and observations.  Electronically signed by Selinda Gu MD on 03/31/2024 at 9:15:35 AM.    Final        Assessment & Plan:   1. Lymphedema (Primary) Chronic lymphedema with persistent bilateral lower extremity swelling, pitting edema, and pain refractory to current therapy. Arterial blood flow adequate. Management supportive to prevent complications. - Referred to lymphedema clinic for further evaluation and management. - Initiated referral for lymphedema pump. - Continued Unna boots and compression therapy with home health nurse. - Advised continued elevation of legs at home. - Scheduled follow-up visit after lymphedema clinic evaluation. - Provided anticipatory guidance regarding insurance process for lymphedema pump.  Recommend:  No surgery or intervention  at this point in time.   The Patient is CEAP [id number].  The patient has been wearing compression for more than 12 weeks with no or little benefit.  The patient has been exercising daily for more than 12 weeks. The patient has been elevating and taking OTC pain medications for more than 12 weeks.  None of these have have eliminated the pain related to the lymphedema or the discomfort regarding excessive swelling and venous congestion.    I have reviewed my discussion with the patient regarding lymphedema and why it  causes symptoms.  Patient will continue wearing graduated compression on a daily basis. The patient should put the compression on first thing in the morning and removing them in the evening. The patient should not sleep in the compression.   In addition, behavioral modification throughout the day will be continued.  This will  include frequent elevation (such as in a recliner), use of over the counter pain medications as needed and exercise such as walking.  The systemic causes for chronic edema such as liver, kidney and cardiac etiologies do not appear to have significant changed over the past year.    The patient has chronic , severe lymphedema with hyperpigmentation of the skin and has done MLD, skin care, medication, diet, exercise, elevation and compression for 4 weeks with no improvement,  I am recommending a lymphedema pump.  The patient still has stage 3 lymphedema and therefore, I believe that a lymph pump is needed to improve the control of the patient's lymphedema and improve the quality of life.  Additionally, a lymph pump is warranted because it will reduce the risk of cellulitis and ulceration in the future.   - VAS US  LOWER EXTREMITY VENOUS REFLUX; Future  2. Essential hypertension Continue antihypertensive medications as already ordered, these medications have been reviewed and there are no changes at this time.  3. PAD (peripheral artery disease) Patient has adequate  perfusion post angiogram on 01/11/2024.  Will plan on rechecking ABIs in 3 months.    Medications Ordered Prior to Encounter[2]  There are no Patient Instructions on file for this visit. Return for Pt conv Venous Reflux JD/FB.   Jeancarlos Marchena E Alessa Mazur, NP      [1]  Allergies Allergen Reactions   Adalat [Nifedipine]     Foot swelling.    Amlodipine  Other (See Comments)    Leg swelling  [2]  Current Outpatient Medications on File Prior to Visit  Medication Sig Dispense Refill   acetaminophen  (TYLENOL ) 325 MG tablet Take 2 tablets (650 mg total) by mouth every 6 (six) hours as needed for mild pain (pain score 1-3) (or Fever >/= 101).     aspirin  EC 81 MG tablet Take 1 tablet (81 mg total) by mouth daily. Swallow whole.     atorvastatin  (LIPITOR) 10 MG tablet Take 1 tablet (10 mg total) by mouth daily.     clopidogrel  (PLAVIX ) 75 MG tablet Take 1 tablet (75 mg total) by mouth daily.     escitalopram  (LEXAPRO ) 5 MG tablet Take 5 mg by mouth daily.     furosemide  (LASIX ) 20 MG tablet Take 2 tablets (40 mg total) by mouth daily.     loperamide  (IMODIUM ) 2 MG capsule Take 2 capsules (4 mg total) by mouth every 6 (six) hours as needed for diarrhea or loose stools.     [Paused] losartan  (COZAAR ) 100 MG tablet Take 1 tablet (100 mg total) by mouth daily. 90 tablet 1   metoprolol  succinate (TOPROL -XL) 25 MG 24 hr tablet Take 1 tablet (25 mg total) by mouth daily. 30 tablet 2   ondansetron  (ZOFRAN ) 4 MG tablet Take 1 tablet (4 mg total) by mouth every 8 (eight) hours as needed for nausea or vomiting. 20 tablet 0   ondansetron  (ZOFRAN ) 4 MG tablet Take 1 tablet (4 mg total) by mouth every 6 (six) hours as needed for nausea, refractory nausea / vomiting or vomiting.     potassium chloride  SA (KLOR-CON  M) 20 MEQ tablet Take 20 mEq by mouth daily.     pantoprazole  (PROTONIX ) 40 MG tablet Take 1 tablet (40 mg total) by mouth 2 (two) times daily. (Patient not taking: Reported on 03/28/2024)      prochlorperazine  (COMPAZINE ) 10 MG tablet Take 1 tablet (10 mg total) by mouth every 8 (eight) hours as needed for nausea or vomiting. (Patient not  taking: Reported on 03/28/2024)     thiamine  (VITAMIN B-1) 100 MG tablet Take 1 tablet (100 mg total) by mouth daily. (Patient not taking: Reported on 03/28/2024)     No current facility-administered medications on file prior to visit.   "

## 2024-04-06 ENCOUNTER — Ambulatory Visit (INDEPENDENT_AMBULATORY_CARE_PROVIDER_SITE_OTHER): Admitting: Nurse Practitioner

## 2024-04-06 ENCOUNTER — Encounter (INDEPENDENT_AMBULATORY_CARE_PROVIDER_SITE_OTHER)
